# Patient Record
Sex: Male | Born: 1955
Health system: Southern US, Community
[De-identification: ages and names within clinical notes are randomized; demographics above are authoritative.]

## PROBLEM LIST (undated history)

## (undated) DIAGNOSIS — I10 Essential (primary) hypertension: Secondary | ICD-10-CM

## (undated) DIAGNOSIS — J302 Other seasonal allergic rhinitis: Secondary | ICD-10-CM

## (undated) DIAGNOSIS — E785 Hyperlipidemia, unspecified: Secondary | ICD-10-CM

## (undated) DIAGNOSIS — N200 Calculus of kidney: Secondary | ICD-10-CM

## (undated) DIAGNOSIS — Z87442 Personal history of urinary calculi: Secondary | ICD-10-CM

## (undated) DIAGNOSIS — C189 Malignant neoplasm of colon, unspecified: Secondary | ICD-10-CM

## (undated) DIAGNOSIS — J189 Pneumonia, unspecified organism: Secondary | ICD-10-CM

## (undated) DIAGNOSIS — D649 Anemia, unspecified: Secondary | ICD-10-CM

## (undated) DIAGNOSIS — Z933 Colostomy status: Secondary | ICD-10-CM

## (undated) DIAGNOSIS — T7840XA Allergy, unspecified, initial encounter: Secondary | ICD-10-CM

## (undated) DIAGNOSIS — H409 Unspecified glaucoma: Secondary | ICD-10-CM

## (undated) DIAGNOSIS — G709 Myoneural disorder, unspecified: Secondary | ICD-10-CM

## (undated) DIAGNOSIS — C801 Malignant (primary) neoplasm, unspecified: Secondary | ICD-10-CM

## (undated) DIAGNOSIS — M1990 Unspecified osteoarthritis, unspecified site: Secondary | ICD-10-CM

## (undated) DIAGNOSIS — R0600 Dyspnea, unspecified: Secondary | ICD-10-CM

## (undated) HISTORY — PX: CHOLECYSTECTOMY: SHX55

## (undated) HISTORY — PX: LUMBAR DISC SURGERY: SHX700

## (undated) HISTORY — DX: Unspecified glaucoma: H40.9

## (undated) HISTORY — PX: PORTACATH PLACEMENT: SHX2246

## (undated) HISTORY — DX: Malignant neoplasm of colon, unspecified: C18.9

## (undated) HISTORY — DX: Anemia, unspecified: D64.9

## (undated) HISTORY — PX: KNEE ARTHROSCOPY: SUR90

## (undated) HISTORY — PX: BACK SURGERY: SHX140

## (undated) HISTORY — DX: Allergy, unspecified, initial encounter: T78.40XA

## (undated) HISTORY — PX: OTHER SURGICAL HISTORY: SHX169

## (undated) HISTORY — PX: SPINE SURGERY: SHX786

## (undated) HISTORY — PX: KNEE SURGERY: SHX244

## (undated) HISTORY — DX: Hyperlipidemia, unspecified: E78.5

## (undated) HISTORY — PX: COLON SURGERY: SHX602

## (undated) HISTORY — DX: Calculus of kidney: N20.0

## (undated) SURGERY — Surgical Case
Anesthesia: *Unknown

---

## 2005-03-11 ENCOUNTER — Emergency Department: Payer: Self-pay | Admitting: Emergency Medicine

## 2009-02-10 ENCOUNTER — Ambulatory Visit: Payer: Self-pay | Admitting: Gastroenterology

## 2010-07-22 ENCOUNTER — Inpatient Hospital Stay: Payer: Self-pay | Admitting: Vascular Surgery

## 2011-08-13 ENCOUNTER — Emergency Department: Payer: Self-pay | Admitting: Emergency Medicine

## 2012-01-04 ENCOUNTER — Ambulatory Visit: Payer: Self-pay | Admitting: Ophthalmology

## 2013-01-23 DIAGNOSIS — C20 Malignant neoplasm of rectum: Secondary | ICD-10-CM

## 2013-01-23 DIAGNOSIS — C801 Malignant (primary) neoplasm, unspecified: Secondary | ICD-10-CM

## 2013-01-23 HISTORY — DX: Malignant neoplasm of rectum: C20

## 2013-01-23 HISTORY — DX: Malignant (primary) neoplasm, unspecified: C80.1

## 2013-04-29 ENCOUNTER — Ambulatory Visit: Payer: Self-pay | Admitting: Surgery

## 2013-04-30 HISTORY — PX: RECTAL BIOPSY: SHX2303

## 2013-05-01 LAB — PATHOLOGY REPORT

## 2013-05-08 ENCOUNTER — Ambulatory Visit: Payer: Self-pay | Admitting: Oncology

## 2013-05-08 LAB — COMPREHENSIVE METABOLIC PANEL
ALBUMIN: 3.7 g/dL (ref 3.4–5.0)
AST: 10 U/L — AB (ref 15–37)
Alkaline Phosphatase: 72 U/L
Anion Gap: 9 (ref 7–16)
BUN: 16 mg/dL (ref 7–18)
Bilirubin,Total: 0.2 mg/dL (ref 0.2–1.0)
Calcium, Total: 9.5 mg/dL (ref 8.5–10.1)
Chloride: 105 mmol/L (ref 98–107)
Co2: 29 mmol/L (ref 21–32)
Creatinine: 1.11 mg/dL (ref 0.60–1.30)
EGFR (Non-African Amer.): >60
Glucose: 101 mg/dL — ABNORMAL HIGH (ref 65–99)
Osmolality: 286 (ref 275–301)
Potassium: 4.2 mmol/L (ref 3.5–5.1)
SGPT (ALT): 15 U/L (ref 12–78)
Sodium: 143 mmol/L (ref 136–145)
TOTAL PROTEIN: 7.3 g/dL (ref 6.4–8.2)

## 2013-05-08 LAB — CBC CANCER CENTER
BASOS ABS: 0.3 x10 3/mm — AB (ref 0.0–0.1)
Basophil %: 4.1 %
Eosinophil #: 0.2 x10 3/mm (ref 0.0–0.7)
Eosinophil %: 2.6 %
HCT: 41.4 % (ref 40.0–52.0)
HGB: 13.2 g/dL (ref 13.0–18.0)
LYMPHS ABS: 1.9 x10 3/mm (ref 1.0–3.6)
Lymphocyte %: 23.6 %
MCH: 27.2 pg (ref 26.0–34.0)
MCHC: 31.9 g/dL — ABNORMAL LOW (ref 32.0–36.0)
MCV: 85 fL (ref 80–100)
Monocyte #: 0.6 x10 3/mm (ref 0.2–1.0)
Monocyte %: 7.1 %
NEUTROS PCT: 62.6 %
Neutrophil #: 5.1 x10 3/mm (ref 1.4–6.5)
PLATELETS: 531 x10 3/mm — AB (ref 150–440)
RBC: 4.85 10*6/uL (ref 4.40–5.90)
RDW: 14.1 % (ref 11.5–14.5)
WBC: 8.2 x10 3/mm (ref 3.8–10.6)

## 2013-05-08 LAB — PROTIME-INR
INR: 1
PROTHROMBIN TIME: 13 s (ref 11.5–14.7)

## 2013-05-08 LAB — APTT: Activated PTT: 29.6 secs (ref 23.6–35.9)

## 2013-05-09 LAB — CEA: CEA: 2.4 ng/mL (ref 0.0–4.7)

## 2013-05-13 ENCOUNTER — Ambulatory Visit: Payer: Self-pay | Admitting: Oncology

## 2013-05-22 ENCOUNTER — Ambulatory Visit: Payer: Self-pay | Admitting: Gastroenterology

## 2013-05-23 ENCOUNTER — Ambulatory Visit: Payer: Self-pay | Admitting: Oncology

## 2013-06-17 ENCOUNTER — Ambulatory Visit: Payer: Self-pay | Admitting: Surgery

## 2013-06-23 ENCOUNTER — Ambulatory Visit: Payer: Self-pay | Admitting: Radiation Oncology

## 2013-06-30 LAB — COMPREHENSIVE METABOLIC PANEL
ANION GAP: 10 (ref 7–16)
Albumin: 3.7 g/dL (ref 3.4–5.0)
Alkaline Phosphatase: 67 U/L
BUN: 17 mg/dL (ref 7–18)
Bilirubin,Total: 0.6 mg/dL (ref 0.2–1.0)
CO2: 26 mmol/L (ref 21–32)
CREATININE: 1.12 mg/dL (ref 0.60–1.30)
Calcium, Total: 9 mg/dL (ref 8.5–10.1)
Chloride: 103 mmol/L (ref 98–107)
EGFR (African American): >60
Glucose: 119 mg/dL — ABNORMAL HIGH (ref 65–99)
Osmolality: 280 (ref 275–301)
POTASSIUM: 4.2 mmol/L (ref 3.5–5.1)
SGOT(AST): 16 U/L (ref 15–37)
SGPT (ALT): 15 U/L (ref 12–78)
Sodium: 139 mmol/L (ref 136–145)
Total Protein: 7.2 g/dL (ref 6.4–8.2)

## 2013-06-30 LAB — CBC CANCER CENTER
Basophil #: 0.1 x10 3/mm (ref 0.0–0.1)
Basophil %: 0.9 %
EOS PCT: 1.4 %
Eosinophil #: 0.1 x10 3/mm (ref 0.0–0.7)
HCT: 39.7 % — ABNORMAL LOW (ref 40.0–52.0)
HGB: 13 g/dL (ref 13.0–18.0)
LYMPHS ABS: 1.2 x10 3/mm (ref 1.0–3.6)
Lymphocyte %: 15.6 %
MCH: 27.8 pg (ref 26.0–34.0)
MCHC: 32.8 g/dL (ref 32.0–36.0)
MCV: 85 fL (ref 80–100)
MONO ABS: 0.5 x10 3/mm (ref 0.2–1.0)
MONOS PCT: 6.1 %
Neutrophil #: 6 x10 3/mm (ref 1.4–6.5)
Neutrophil %: 76 %
PLATELETS: 409 x10 3/mm (ref 150–440)
RBC: 4.68 10*6/uL (ref 4.40–5.90)
RDW: 14.5 % (ref 11.5–14.5)
WBC: 7.9 x10 3/mm (ref 3.8–10.6)

## 2013-07-07 LAB — CBC CANCER CENTER
BASOS ABS: 0 x10 3/mm (ref 0.0–0.1)
BASOS PCT: 0.8 %
Eosinophil #: 0.2 x10 3/mm (ref 0.0–0.7)
Eosinophil %: 4.6 %
HCT: 38.5 % — ABNORMAL LOW (ref 40.0–52.0)
HGB: 12.6 g/dL — ABNORMAL LOW (ref 13.0–18.0)
LYMPHS ABS: 1.1 x10 3/mm (ref 1.0–3.6)
Lymphocyte %: 29 %
MCH: 27.9 pg (ref 26.0–34.0)
MCHC: 32.8 g/dL (ref 32.0–36.0)
MCV: 85 fL (ref 80–100)
Monocyte #: 0.2 x10 3/mm (ref 0.2–1.0)
Monocyte %: 4.8 %
NEUTROS PCT: 60.8 %
Neutrophil #: 2.2 x10 3/mm (ref 1.4–6.5)
Platelet: 396 x10 3/mm (ref 150–440)
RBC: 4.52 10*6/uL (ref 4.40–5.90)
RDW: 14.9 % — ABNORMAL HIGH (ref 11.5–14.5)
WBC: 3.7 x10 3/mm — ABNORMAL LOW (ref 3.8–10.6)

## 2013-07-14 LAB — COMPREHENSIVE METABOLIC PANEL
ALT: 15 U/L (ref 12–78)
ANION GAP: 6 — AB (ref 7–16)
Albumin: 3.6 g/dL (ref 3.4–5.0)
Alkaline Phosphatase: 66 U/L
BUN: 18 mg/dL (ref 7–18)
Bilirubin,Total: 0.3 mg/dL (ref 0.2–1.0)
CO2: 30 mmol/L (ref 21–32)
Calcium, Total: 9.3 mg/dL (ref 8.5–10.1)
Chloride: 103 mmol/L (ref 98–107)
Creatinine: 1 mg/dL (ref 0.60–1.30)
EGFR (African American): >60
Glucose: 102 mg/dL — ABNORMAL HIGH (ref 65–99)
Osmolality: 280 (ref 275–301)
Potassium: 4.1 mmol/L (ref 3.5–5.1)
SGOT(AST): 9 U/L — ABNORMAL LOW (ref 15–37)
Sodium: 139 mmol/L (ref 136–145)
Total Protein: 7 g/dL (ref 6.4–8.2)

## 2013-07-14 LAB — CBC CANCER CENTER
BASOS ABS: 0 x10 3/mm (ref 0.0–0.1)
BASOS PCT: 0.8 %
Eosinophil #: 0.2 x10 3/mm (ref 0.0–0.7)
Eosinophil %: 6.3 %
HCT: 38.7 % — AB (ref 40.0–52.0)
HGB: 12.7 g/dL — ABNORMAL LOW (ref 13.0–18.0)
Lymphocyte #: 0.5 x10 3/mm — ABNORMAL LOW (ref 1.0–3.6)
Lymphocyte %: 12.6 %
MCH: 28 pg (ref 26.0–34.0)
MCHC: 32.8 g/dL (ref 32.0–36.0)
MCV: 85 fL (ref 80–100)
MONO ABS: 0.3 x10 3/mm (ref 0.2–1.0)
Monocyte %: 8.7 %
NEUTROS PCT: 71.6 %
Neutrophil #: 2.7 x10 3/mm (ref 1.4–6.5)
Platelet: 275 x10 3/mm (ref 150–440)
RBC: 4.54 10*6/uL (ref 4.40–5.90)
RDW: 14.9 % — ABNORMAL HIGH (ref 11.5–14.5)
WBC: 3.8 x10 3/mm (ref 3.8–10.6)

## 2013-07-21 LAB — CBC CANCER CENTER
BASOS ABS: 0 x10 3/mm (ref 0.0–0.1)
Basophil %: 0.6 %
EOS PCT: 8.4 %
Eosinophil #: 0.2 x10 3/mm (ref 0.0–0.7)
HCT: 36.9 % — AB (ref 40.0–52.0)
HGB: 12.3 g/dL — ABNORMAL LOW (ref 13.0–18.0)
Lymphocyte #: 0.4 x10 3/mm — ABNORMAL LOW (ref 1.0–3.6)
Lymphocyte %: 13.9 %
MCH: 28.3 pg (ref 26.0–34.0)
MCHC: 33.5 g/dL (ref 32.0–36.0)
MCV: 85 fL (ref 80–100)
Monocyte #: 0.3 x10 3/mm (ref 0.2–1.0)
Monocyte %: 8.9 %
Neutrophil #: 2 x10 3/mm (ref 1.4–6.5)
Neutrophil %: 68.2 %
Platelet: 291 x10 3/mm (ref 150–440)
RBC: 4.36 10*6/uL — AB (ref 4.40–5.90)
RDW: 15.2 % — ABNORMAL HIGH (ref 11.5–14.5)
WBC: 3 x10 3/mm — ABNORMAL LOW (ref 3.8–10.6)

## 2013-07-23 ENCOUNTER — Ambulatory Visit: Payer: Self-pay | Admitting: Radiation Oncology

## 2013-07-28 LAB — CBC CANCER CENTER
BASOS ABS: 0 x10 3/mm (ref 0.0–0.1)
Basophil %: 0.4 %
EOS ABS: 0.2 x10 3/mm (ref 0.0–0.7)
Eosinophil %: 4.7 %
HCT: 39.7 % — ABNORMAL LOW (ref 40.0–52.0)
HGB: 13.1 g/dL (ref 13.0–18.0)
Lymphocyte #: 0.3 x10 3/mm — ABNORMAL LOW (ref 1.0–3.6)
Lymphocyte %: 8 %
MCH: 28.1 pg (ref 26.0–34.0)
MCHC: 33 g/dL (ref 32.0–36.0)
MCV: 85 fL (ref 80–100)
Monocyte #: 0.4 x10 3/mm (ref 0.2–1.0)
Monocyte %: 9.6 %
NEUTROS ABS: 3.2 x10 3/mm (ref 1.4–6.5)
Neutrophil %: 77.3 %
Platelet: 337 x10 3/mm (ref 150–440)
RBC: 4.65 10*6/uL (ref 4.40–5.90)
RDW: 16 % — ABNORMAL HIGH (ref 11.5–14.5)
WBC: 4.2 x10 3/mm (ref 3.8–10.6)

## 2013-08-04 LAB — CBC CANCER CENTER
BASOS ABS: 0 x10 3/mm (ref 0.0–0.1)
BASOS PCT: 0.5 %
EOS PCT: 6 %
Eosinophil #: 0.2 x10 3/mm (ref 0.0–0.7)
HCT: 36.8 % — AB (ref 40.0–52.0)
HGB: 12.4 g/dL — ABNORMAL LOW (ref 13.0–18.0)
LYMPHS ABS: 0.2 x10 3/mm — AB (ref 1.0–3.6)
LYMPHS PCT: 7.1 %
MCH: 28.6 pg (ref 26.0–34.0)
MCHC: 33.8 g/dL (ref 32.0–36.0)
MCV: 85 fL (ref 80–100)
MONO ABS: 0.4 x10 3/mm (ref 0.2–1.0)
MONOS PCT: 11.1 %
Neutrophil #: 2.4 x10 3/mm (ref 1.4–6.5)
Neutrophil %: 75.3 %
Platelet: 427 x10 3/mm (ref 150–440)
RBC: 4.35 10*6/uL — AB (ref 4.40–5.90)
RDW: 16.7 % — ABNORMAL HIGH (ref 11.5–14.5)
WBC: 3.2 x10 3/mm — ABNORMAL LOW (ref 3.8–10.6)

## 2013-08-11 LAB — CBC CANCER CENTER
Basophil #: 0 x10 3/mm (ref 0.0–0.1)
Basophil %: 0.3 %
Eosinophil #: 0.5 x10 3/mm (ref 0.0–0.7)
Eosinophil %: 13.1 %
HCT: 38.6 % — ABNORMAL LOW (ref 40.0–52.0)
HGB: 12.8 g/dL — ABNORMAL LOW (ref 13.0–18.0)
LYMPHS ABS: 0.2 x10 3/mm — AB (ref 1.0–3.6)
Lymphocyte %: 5.4 %
MCH: 28.3 pg (ref 26.0–34.0)
MCHC: 33.1 g/dL (ref 32.0–36.0)
MCV: 85 fL (ref 80–100)
Monocyte #: 0.2 x10 3/mm (ref 0.2–1.0)
Monocyte %: 5.6 %
Neutrophil #: 2.9 x10 3/mm (ref 1.4–6.5)
Neutrophil %: 75.6 %
Platelet: 456 x10 3/mm — ABNORMAL HIGH (ref 150–440)
RBC: 4.52 10*6/uL (ref 4.40–5.90)
RDW: 16.4 % — ABNORMAL HIGH (ref 11.5–14.5)
WBC: 3.9 x10 3/mm (ref 3.8–10.6)

## 2013-08-11 LAB — COMPREHENSIVE METABOLIC PANEL
Albumin: 3.5 g/dL (ref 3.4–5.0)
Alkaline Phosphatase: 56 U/L
Anion Gap: 9 (ref 7–16)
BUN: 11 mg/dL (ref 7–18)
Bilirubin,Total: 0.3 mg/dL (ref 0.2–1.0)
CREATININE: 1.17 mg/dL (ref 0.60–1.30)
Calcium, Total: 9.2 mg/dL (ref 8.5–10.1)
Chloride: 105 mmol/L (ref 98–107)
Co2: 27 mmol/L (ref 21–32)
EGFR (African American): >60
EGFR (Non-African Amer.): >60
Glucose: 98 mg/dL (ref 65–99)
Osmolality: 281 (ref 275–301)
POTASSIUM: 4 mmol/L (ref 3.5–5.1)
SGOT(AST): 14 U/L — ABNORMAL LOW (ref 15–37)
SGPT (ALT): 27 U/L (ref 12–78)
SODIUM: 141 mmol/L (ref 136–145)
Total Protein: 6.9 g/dL (ref 6.4–8.2)

## 2013-08-18 ENCOUNTER — Ambulatory Visit: Payer: Self-pay | Admitting: Family Medicine

## 2013-08-23 ENCOUNTER — Ambulatory Visit: Payer: Self-pay | Admitting: Radiation Oncology

## 2013-09-30 ENCOUNTER — Ambulatory Visit: Payer: Self-pay | Admitting: Oncology

## 2013-09-30 ENCOUNTER — Ambulatory Visit: Payer: Self-pay | Admitting: Surgery

## 2013-09-30 LAB — CBC WITH DIFFERENTIAL/PLATELET
Basophil #: 0.1 10*3/uL (ref 0.0–0.1)
Basophil %: 1.6 %
Eosinophil #: 0.2 10*3/uL (ref 0.0–0.7)
Eosinophil %: 4.5 %
HCT: 39.1 % — ABNORMAL LOW (ref 40.0–52.0)
HGB: 12.5 g/dL — ABNORMAL LOW (ref 13.0–18.0)
Lymphocyte #: 0.4 10*3/uL — ABNORMAL LOW (ref 1.0–3.6)
Lymphocyte %: 10.1 %
MCH: 28.5 pg (ref 26.0–34.0)
MCHC: 31.9 g/dL — ABNORMAL LOW (ref 32.0–36.0)
MCV: 89 fL (ref 80–100)
Monocyte #: 0.4 x10 3/mm (ref 0.2–1.0)
Monocyte %: 10.1 %
NEUTROS ABS: 2.6 10*3/uL (ref 1.4–6.5)
Neutrophil %: 73.7 %
Platelet: 342 10*3/uL (ref 150–440)
RBC: 4.38 10*6/uL — AB (ref 4.40–5.90)
RDW: 17 % — ABNORMAL HIGH (ref 11.5–14.5)
WBC: 3.5 10*3/uL — ABNORMAL LOW (ref 3.8–10.6)

## 2013-09-30 LAB — BASIC METABOLIC PANEL
Anion Gap: 4 — ABNORMAL LOW (ref 7–16)
BUN: 11 mg/dL (ref 7–18)
CALCIUM: 8.5 mg/dL (ref 8.5–10.1)
CO2: 27 mmol/L (ref 21–32)
Chloride: 109 mmol/L — ABNORMAL HIGH (ref 98–107)
Creatinine: 1.02 mg/dL (ref 0.60–1.30)
Glucose: 93 mg/dL (ref 65–99)
Osmolality: 278 (ref 275–301)
Potassium: 4.1 mmol/L (ref 3.5–5.1)
Sodium: 140 mmol/L (ref 136–145)

## 2013-09-30 LAB — HEPATIC FUNCTION PANEL A (ARMC)
ALBUMIN: 3.3 g/dL — AB (ref 3.4–5.0)
ALK PHOS: 71 U/L
AST: 21 U/L (ref 15–37)
Bilirubin, Direct: <0.1 mg/dL (ref 0.00–0.20)
Bilirubin,Total: 0.3 mg/dL (ref 0.2–1.0)
SGPT (ALT): 16 U/L
Total Protein: 6.8 g/dL (ref 6.4–8.2)

## 2013-10-01 LAB — CEA: CEA: 1.1 ng/mL (ref 0.0–4.7)

## 2013-10-07 ENCOUNTER — Inpatient Hospital Stay: Payer: Self-pay | Admitting: Surgery

## 2013-10-08 LAB — COMPREHENSIVE METABOLIC PANEL
ANION GAP: 7 (ref 7–16)
AST: 20 U/L (ref 15–37)
Albumin: 2.5 g/dL — ABNORMAL LOW (ref 3.4–5.0)
Alkaline Phosphatase: 46 U/L
BUN: 15 mg/dL (ref 7–18)
Bilirubin,Total: 0.8 mg/dL (ref 0.2–1.0)
CALCIUM: 7.9 mg/dL — AB (ref 8.5–10.1)
CHLORIDE: 107 mmol/L (ref 98–107)
Co2: 26 mmol/L (ref 21–32)
Creatinine: 1.27 mg/dL (ref 0.60–1.30)
EGFR (African American): >60
EGFR (Non-African Amer.): >60
Glucose: 122 mg/dL — ABNORMAL HIGH (ref 65–99)
Osmolality: 282 (ref 275–301)
Potassium: 4.1 mmol/L (ref 3.5–5.1)
SGPT (ALT): 16 U/L
Sodium: 140 mmol/L (ref 136–145)
Total Protein: 5.6 g/dL — ABNORMAL LOW (ref 6.4–8.2)

## 2013-10-08 LAB — CBC WITH DIFFERENTIAL/PLATELET
BASOS ABS: 0 10*3/uL (ref 0.0–0.1)
Basophil %: 0.2 %
Eosinophil #: 0 10*3/uL (ref 0.0–0.7)
Eosinophil %: 0 %
HCT: 33.2 % — ABNORMAL LOW (ref 40.0–52.0)
HGB: 11.2 g/dL — AB (ref 13.0–18.0)
Lymphocyte #: 0.3 10*3/uL — ABNORMAL LOW (ref 1.0–3.6)
Lymphocyte %: 4.9 %
MCH: 29.7 pg (ref 26.0–34.0)
MCHC: 33.7 g/dL (ref 32.0–36.0)
MCV: 88 fL (ref 80–100)
Monocyte #: 0.9 x10 3/mm (ref 0.2–1.0)
Monocyte %: 13.7 %
NEUTROS ABS: 5.2 10*3/uL (ref 1.4–6.5)
NEUTROS PCT: 81.2 %
Platelet: 377 10*3/uL (ref 150–440)
RBC: 3.77 10*6/uL — ABNORMAL LOW (ref 4.40–5.90)
RDW: 16.4 % — ABNORMAL HIGH (ref 11.5–14.5)
WBC: 6.5 10*3/uL (ref 3.8–10.6)

## 2013-10-09 HISTORY — PX: XI ROBOT ABDOMINAL PERINEAL RESECTION: SHX6709

## 2013-10-10 LAB — CBC WITH DIFFERENTIAL/PLATELET
Basophil #: 0 10*3/uL (ref 0.0–0.1)
Basophil %: 0.6 %
EOS PCT: 2.1 %
Eosinophil #: 0.1 10*3/uL (ref 0.0–0.7)
HCT: 27 % — AB (ref 40.0–52.0)
HGB: 9.2 g/dL — ABNORMAL LOW (ref 13.0–18.0)
LYMPHS PCT: 4.7 %
Lymphocyte #: 0.3 10*3/uL — ABNORMAL LOW (ref 1.0–3.6)
MCH: 30.2 pg (ref 26.0–34.0)
MCHC: 33.9 g/dL (ref 32.0–36.0)
MCV: 89 fL (ref 80–100)
MONOS PCT: 11.8 %
Monocyte #: 0.7 x10 3/mm (ref 0.2–1.0)
NEUTROS ABS: 4.8 10*3/uL (ref 1.4–6.5)
NEUTROS PCT: 80.8 %
Platelet: 361 10*3/uL (ref 150–440)
RBC: 3.04 10*6/uL — ABNORMAL LOW (ref 4.40–5.90)
RDW: 16 % — ABNORMAL HIGH (ref 11.5–14.5)
WBC: 5.9 10*3/uL (ref 3.8–10.6)

## 2013-10-10 LAB — COMPREHENSIVE METABOLIC PANEL
ALBUMIN: 2 g/dL — AB (ref 3.4–5.0)
ANION GAP: 5 — AB (ref 7–16)
Alkaline Phosphatase: 36 U/L — ABNORMAL LOW
BUN: 14 mg/dL (ref 7–18)
Bilirubin,Total: 0.3 mg/dL (ref 0.2–1.0)
CHLORIDE: 101 mmol/L (ref 98–107)
CREATININE: 0.84 mg/dL (ref 0.60–1.30)
Calcium, Total: 8.1 mg/dL — ABNORMAL LOW (ref 8.5–10.1)
Co2: 30 mmol/L (ref 21–32)
EGFR (African American): >60
Glucose: 120 mg/dL — ABNORMAL HIGH (ref 65–99)
Osmolality: 274 (ref 275–301)
Potassium: 4.2 mmol/L (ref 3.5–5.1)
SGOT(AST): 14 U/L — ABNORMAL LOW (ref 15–37)
SGPT (ALT): 11 U/L — ABNORMAL LOW
SODIUM: 136 mmol/L (ref 136–145)
Total Protein: 5.5 g/dL — ABNORMAL LOW (ref 6.4–8.2)

## 2013-10-12 LAB — CBC WITH DIFFERENTIAL/PLATELET
Basophil #: 0 10*3/uL (ref 0.0–0.1)
Basophil %: 0.3 %
EOS PCT: 0.5 %
Eosinophil #: 0 10*3/uL (ref 0.0–0.7)
HCT: 30.9 % — ABNORMAL LOW (ref 40.0–52.0)
HGB: 10.3 g/dL — AB (ref 13.0–18.0)
LYMPHS ABS: 0.2 10*3/uL — AB (ref 1.0–3.6)
LYMPHS PCT: 2.8 %
MCH: 29.4 pg (ref 26.0–34.0)
MCHC: 33.3 g/dL (ref 32.0–36.0)
MCV: 88 fL (ref 80–100)
Monocyte #: 0.6 x10 3/mm (ref 0.2–1.0)
Monocyte %: 8.4 %
NEUTROS PCT: 88 %
Neutrophil #: 6.4 10*3/uL (ref 1.4–6.5)
Platelet: 520 10*3/uL — ABNORMAL HIGH (ref 150–440)
RBC: 3.51 10*6/uL — AB (ref 4.40–5.90)
RDW: 15.6 % — ABNORMAL HIGH (ref 11.5–14.5)
WBC: 7.2 10*3/uL (ref 3.8–10.6)

## 2013-10-12 LAB — BASIC METABOLIC PANEL
Anion Gap: 7 (ref 7–16)
BUN: 19 mg/dL — ABNORMAL HIGH (ref 7–18)
CALCIUM: 8.9 mg/dL (ref 8.5–10.1)
CO2: 28 mmol/L (ref 21–32)
Chloride: 97 mmol/L — ABNORMAL LOW (ref 98–107)
Creatinine: 0.91 mg/dL (ref 0.60–1.30)
EGFR (African American): >60
GLUCOSE: 135 mg/dL — AB (ref 65–99)
Osmolality: 269 (ref 275–301)
POTASSIUM: 4.2 mmol/L (ref 3.5–5.1)
SODIUM: 132 mmol/L — AB (ref 136–145)

## 2013-10-13 LAB — BASIC METABOLIC PANEL
ANION GAP: 7 (ref 7–16)
BUN: 24 mg/dL — ABNORMAL HIGH (ref 7–18)
CALCIUM: 8.3 mg/dL — AB (ref 8.5–10.1)
CHLORIDE: 100 mmol/L (ref 98–107)
CO2: 28 mmol/L (ref 21–32)
CREATININE: 0.89 mg/dL (ref 0.60–1.30)
EGFR (African American): >60
EGFR (Non-African Amer.): >60
GLUCOSE: 108 mg/dL — AB (ref 65–99)
OSMOLALITY: 275 (ref 275–301)
POTASSIUM: 4.5 mmol/L (ref 3.5–5.1)
SODIUM: 135 mmol/L — AB (ref 136–145)

## 2013-10-13 LAB — CBC WITH DIFFERENTIAL/PLATELET
BASOS ABS: 0 10*3/uL (ref 0.0–0.1)
Basophil %: 0.4 %
EOS ABS: 0.1 10*3/uL (ref 0.0–0.7)
Eosinophil %: 2 %
HCT: 30 % — AB (ref 40.0–52.0)
HGB: 10.2 g/dL — AB (ref 13.0–18.0)
LYMPHS PCT: 3.6 %
Lymphocyte #: 0.2 10*3/uL — ABNORMAL LOW (ref 1.0–3.6)
MCH: 29.8 pg (ref 26.0–34.0)
MCHC: 33.9 g/dL (ref 32.0–36.0)
MCV: 88 fL (ref 80–100)
Monocyte #: 0.6 x10 3/mm (ref 0.2–1.0)
Monocyte %: 9.9 %
Neutrophil #: 5.2 10*3/uL (ref 1.4–6.5)
Neutrophil %: 84.1 %
Platelet: 533 10*3/uL — ABNORMAL HIGH (ref 150–440)
RBC: 3.41 10*6/uL — ABNORMAL LOW (ref 4.40–5.90)
RDW: 16 % — ABNORMAL HIGH (ref 11.5–14.5)
WBC: 6.2 10*3/uL (ref 3.8–10.6)

## 2013-10-15 LAB — CLOSTRIDIUM DIFFICILE(ARMC)

## 2013-10-15 LAB — PATHOLOGY REPORT

## 2013-10-16 LAB — BASIC METABOLIC PANEL
ANION GAP: 8 (ref 7–16)
BUN: 17 mg/dL (ref 7–18)
CALCIUM: 8 mg/dL — AB (ref 8.5–10.1)
CHLORIDE: 103 mmol/L (ref 98–107)
Co2: 25 mmol/L (ref 21–32)
Creatinine: 0.87 mg/dL (ref 0.60–1.30)
EGFR (African American): >60
Glucose: 96 mg/dL (ref 65–99)
OSMOLALITY: 273 (ref 275–301)
POTASSIUM: 3.5 mmol/L (ref 3.5–5.1)
Sodium: 136 mmol/L (ref 136–145)

## 2013-10-16 LAB — CBC WITH DIFFERENTIAL/PLATELET
Basophil #: 0 10*3/uL (ref 0.0–0.1)
Basophil %: 0.7 %
EOS ABS: 0.3 10*3/uL (ref 0.0–0.7)
Eosinophil %: 4.1 %
HCT: 30.2 % — ABNORMAL LOW (ref 40.0–52.0)
HGB: 9.8 g/dL — ABNORMAL LOW (ref 13.0–18.0)
LYMPHS ABS: 0.4 10*3/uL — AB (ref 1.0–3.6)
LYMPHS PCT: 5.3 %
MCH: 28.5 pg (ref 26.0–34.0)
MCHC: 32.6 g/dL (ref 32.0–36.0)
MCV: 88 fL (ref 80–100)
Monocyte #: 0.7 x10 3/mm (ref 0.2–1.0)
Monocyte %: 10.2 %
Neutrophil #: 5.5 10*3/uL (ref 1.4–6.5)
Neutrophil %: 79.7 %
Platelet: 710 10*3/uL — ABNORMAL HIGH (ref 150–440)
RBC: 3.44 10*6/uL — AB (ref 4.40–5.90)
RDW: 15.8 % — AB (ref 11.5–14.5)
WBC: 6.9 10*3/uL (ref 3.8–10.6)

## 2013-10-22 ENCOUNTER — Other Ambulatory Visit: Payer: Self-pay | Admitting: Surgery

## 2013-10-22 LAB — CBC WITH DIFFERENTIAL/PLATELET
Basophil #: 0.1 10*3/uL (ref 0.0–0.1)
Basophil %: 0.7 %
Eosinophil #: 0.1 10*3/uL (ref 0.0–0.7)
Eosinophil %: 1.4 %
HCT: 33.3 % — ABNORMAL LOW (ref 40.0–52.0)
HGB: 10.8 g/dL — AB (ref 13.0–18.0)
Lymphocyte #: 0.4 10*3/uL — ABNORMAL LOW (ref 1.0–3.6)
Lymphocyte %: 4.7 %
MCH: 28.5 pg (ref 26.0–34.0)
MCHC: 32.6 g/dL (ref 32.0–36.0)
MCV: 88 fL (ref 80–100)
Monocyte #: 0.7 x10 3/mm (ref 0.2–1.0)
Monocyte %: 8.6 %
Neutrophil #: 7.2 10*3/uL — ABNORMAL HIGH (ref 1.4–6.5)
Neutrophil %: 84.6 %
PLATELETS: 753 10*3/uL — AB (ref 150–440)
RBC: 3.8 10*6/uL — ABNORMAL LOW (ref 4.40–5.90)
RDW: 16.1 % — ABNORMAL HIGH (ref 11.5–14.5)
WBC: 8.5 10*3/uL (ref 3.8–10.6)

## 2013-10-22 LAB — COMPREHENSIVE METABOLIC PANEL
ANION GAP: 7 (ref 7–16)
AST: 21 U/L (ref 15–37)
Albumin: 2.8 g/dL — ABNORMAL LOW (ref 3.4–5.0)
Alkaline Phosphatase: 65 U/L
BUN: 9 mg/dL (ref 7–18)
Bilirubin,Total: 0.4 mg/dL (ref 0.2–1.0)
CALCIUM: 8.6 mg/dL (ref 8.5–10.1)
CO2: 26 mmol/L (ref 21–32)
Chloride: 104 mmol/L (ref 98–107)
Creatinine: 0.91 mg/dL (ref 0.60–1.30)
EGFR (African American): >60
Glucose: 103 mg/dL — ABNORMAL HIGH (ref 65–99)
OSMOLALITY: 273 (ref 275–301)
Potassium: 3.8 mmol/L (ref 3.5–5.1)
SGPT (ALT): 36 U/L
Sodium: 137 mmol/L (ref 136–145)
Total Protein: 6.7 g/dL (ref 6.4–8.2)

## 2013-10-30 ENCOUNTER — Ambulatory Visit: Payer: Self-pay | Admitting: Surgery

## 2013-10-30 LAB — CBC WITH DIFFERENTIAL/PLATELET
Basophil #: 0 10*3/uL (ref 0.0–0.1)
Basophil %: 0.6 %
Eosinophil #: 0.1 10*3/uL (ref 0.0–0.7)
Eosinophil %: 2 %
HCT: 31.8 % — ABNORMAL LOW (ref 40.0–52.0)
HGB: 10.2 g/dL — AB (ref 13.0–18.0)
Lymphocyte #: 0.4 10*3/uL — ABNORMAL LOW (ref 1.0–3.6)
Lymphocyte %: 5.9 %
MCH: 27.4 pg (ref 26.0–34.0)
MCHC: 31.9 g/dL — AB (ref 32.0–36.0)
MCV: 86 fL (ref 80–100)
MONO ABS: 0.4 x10 3/mm (ref 0.2–1.0)
Monocyte %: 6.1 %
Neutrophil #: 5.5 10*3/uL (ref 1.4–6.5)
Neutrophil %: 85.4 %
PLATELETS: 761 10*3/uL — AB (ref 150–440)
RBC: 3.71 10*6/uL — AB (ref 4.40–5.90)
RDW: 15.7 % — AB (ref 11.5–14.5)
WBC: 6.5 10*3/uL (ref 3.8–10.6)

## 2013-10-30 LAB — COMPREHENSIVE METABOLIC PANEL
ALBUMIN: 2.7 g/dL — AB (ref 3.4–5.0)
ALK PHOS: 64 U/L
Anion Gap: 8 (ref 7–16)
BILIRUBIN TOTAL: 0.3 mg/dL (ref 0.2–1.0)
BUN: 9 mg/dL (ref 7–18)
CHLORIDE: 101 mmol/L (ref 98–107)
CREATININE: 0.87 mg/dL (ref 0.60–1.30)
Calcium, Total: 9 mg/dL (ref 8.5–10.1)
Co2: 28 mmol/L (ref 21–32)
EGFR (Non-African Amer.): >60
Glucose: 102 mg/dL — ABNORMAL HIGH (ref 65–99)
Osmolality: 273 (ref 275–301)
Potassium: 4.5 mmol/L (ref 3.5–5.1)
SGOT(AST): 14 U/L — ABNORMAL LOW (ref 15–37)
SGPT (ALT): 20 U/L
Sodium: 137 mmol/L (ref 136–145)
Total Protein: 7.4 g/dL (ref 6.4–8.2)

## 2013-10-30 LAB — URINALYSIS, COMPLETE
BILIRUBIN, UR: NEGATIVE
Bacteria: NONE SEEN
Blood: NEGATIVE
Glucose,UR: NEGATIVE mg/dL (ref 0–75)
Ketone: NEGATIVE
Leukocyte Esterase: NEGATIVE
Nitrite: NEGATIVE
PH: 6 (ref 4.5–8.0)
Protein: NEGATIVE
RBC,UR: 2 /HPF (ref 0–5)
SQUAMOUS EPITHELIAL: NONE SEEN
Specific Gravity: 1.018 (ref 1.003–1.030)
WBC UR: 3 /HPF (ref 0–5)

## 2013-10-30 LAB — CLOSTRIDIUM DIFFICILE(ARMC)

## 2013-11-01 LAB — URINE CULTURE

## 2013-11-13 ENCOUNTER — Ambulatory Visit: Payer: Self-pay | Admitting: Oncology

## 2013-11-23 ENCOUNTER — Ambulatory Visit: Payer: Self-pay | Admitting: Oncology

## 2013-11-27 LAB — COMPREHENSIVE METABOLIC PANEL
ALBUMIN: 3.3 g/dL — AB (ref 3.4–5.0)
ALT: 13 U/L — AB
ANION GAP: 12 (ref 7–16)
AST: 9 U/L — AB (ref 15–37)
Alkaline Phosphatase: 69 U/L
BILIRUBIN TOTAL: 0.2 mg/dL (ref 0.2–1.0)
BUN: 12 mg/dL (ref 7–18)
CHLORIDE: 103 mmol/L (ref 98–107)
CREATININE: 1.02 mg/dL (ref 0.60–1.30)
Calcium, Total: 9.5 mg/dL (ref 8.5–10.1)
Co2: 27 mmol/L (ref 21–32)
EGFR (African American): >60
EGFR (Non-African Amer.): >60
GLUCOSE: 128 mg/dL — AB (ref 65–99)
Osmolality: 285 (ref 275–301)
Potassium: 4.3 mmol/L (ref 3.5–5.1)
Sodium: 142 mmol/L (ref 136–145)
TOTAL PROTEIN: 7.2 g/dL (ref 6.4–8.2)

## 2013-11-27 LAB — CBC CANCER CENTER
Basophil #: 0.1 x10 3/mm (ref 0.0–0.1)
Basophil %: 1 %
Eosinophil #: 0.1 x10 3/mm (ref 0.0–0.7)
Eosinophil %: 2.4 %
HCT: 37.1 % — ABNORMAL LOW (ref 40.0–52.0)
HGB: 11.8 g/dL — ABNORMAL LOW (ref 13.0–18.0)
LYMPHS ABS: 0.5 x10 3/mm — AB (ref 1.0–3.6)
Lymphocyte %: 9 %
MCH: 26.6 pg (ref 26.0–34.0)
MCHC: 31.7 g/dL — ABNORMAL LOW (ref 32.0–36.0)
MCV: 84 fL (ref 80–100)
MONOS PCT: 5.3 %
Monocyte #: 0.3 x10 3/mm (ref 0.2–1.0)
Neutrophil #: 4.4 x10 3/mm (ref 1.4–6.5)
Neutrophil %: 82.3 %
Platelet: 671 x10 3/mm — ABNORMAL HIGH (ref 150–440)
RBC: 4.44 10*6/uL (ref 4.40–5.90)
RDW: 17.3 % — ABNORMAL HIGH (ref 11.5–14.5)
WBC: 5.4 x10 3/mm (ref 3.8–10.6)

## 2013-12-23 ENCOUNTER — Ambulatory Visit: Payer: Self-pay | Admitting: Oncology

## 2013-12-25 LAB — CBC CANCER CENTER
BASOS ABS: 0.1 x10 3/mm (ref 0.0–0.1)
BASOS PCT: 2.2 %
EOS PCT: 4.6 %
Eosinophil #: 0.2 x10 3/mm (ref 0.0–0.7)
HCT: 39.1 % — ABNORMAL LOW (ref 40.0–52.0)
HGB: 12.4 g/dL — ABNORMAL LOW (ref 13.0–18.0)
LYMPHS ABS: 0.4 x10 3/mm — AB (ref 1.0–3.6)
Lymphocyte %: 13.1 %
MCH: 25.4 pg — ABNORMAL LOW (ref 26.0–34.0)
MCHC: 31.7 g/dL — ABNORMAL LOW (ref 32.0–36.0)
MCV: 80 fL (ref 80–100)
Monocyte #: 0.3 x10 3/mm (ref 0.2–1.0)
Monocyte %: 8.8 %
NEUTROS PCT: 71.3 %
Neutrophil #: 2.4 x10 3/mm (ref 1.4–6.5)
PLATELETS: 510 x10 3/mm — AB (ref 150–440)
RBC: 4.87 10*6/uL (ref 4.40–5.90)
RDW: 18.4 % — ABNORMAL HIGH (ref 11.5–14.5)
WBC: 3.4 x10 3/mm — ABNORMAL LOW (ref 3.8–10.6)

## 2013-12-25 LAB — COMPREHENSIVE METABOLIC PANEL
ALK PHOS: 74 U/L
ALT: 17 U/L
ANION GAP: 10 (ref 7–16)
Albumin: 3.5 g/dL (ref 3.4–5.0)
BILIRUBIN TOTAL: 0.3 mg/dL (ref 0.2–1.0)
BUN: 21 mg/dL — ABNORMAL HIGH (ref 7–18)
CHLORIDE: 107 mmol/L (ref 98–107)
CO2: 27 mmol/L (ref 21–32)
Calcium, Total: 9.3 mg/dL (ref 8.5–10.1)
Creatinine: 1.07 mg/dL (ref 0.60–1.30)
Glucose: 116 mg/dL — ABNORMAL HIGH (ref 65–99)
Osmolality: 291 (ref 275–301)
Potassium: 4.2 mmol/L (ref 3.5–5.1)
SGOT(AST): 15 U/L (ref 15–37)
Sodium: 144 mmol/L (ref 136–145)
Total Protein: 7.1 g/dL (ref 6.4–8.2)

## 2014-01-23 ENCOUNTER — Ambulatory Visit: Payer: Self-pay | Admitting: Oncology

## 2014-02-05 LAB — CBC CANCER CENTER
BASOS PCT: 1.4 %
Basophil #: 0 x10 3/mm (ref 0.0–0.1)
Eosinophil #: 0.1 x10 3/mm (ref 0.0–0.7)
Eosinophil %: 4.4 %
HCT: 39.9 % — ABNORMAL LOW (ref 40.0–52.0)
HGB: 12.8 g/dL — AB (ref 13.0–18.0)
Lymphocyte #: 0.5 x10 3/mm — ABNORMAL LOW (ref 1.0–3.6)
Lymphocyte %: 16.7 %
MCH: 26 pg (ref 26.0–34.0)
MCHC: 32 g/dL (ref 32.0–36.0)
MCV: 81 fL (ref 80–100)
MONO ABS: 0.3 x10 3/mm (ref 0.2–1.0)
Monocyte %: 9.5 %
NEUTROS PCT: 68 %
Neutrophil #: 2.1 x10 3/mm (ref 1.4–6.5)
Platelet: 362 x10 3/mm (ref 150–440)
RBC: 4.9 10*6/uL (ref 4.40–5.90)
RDW: 19.7 % — ABNORMAL HIGH (ref 11.5–14.5)
WBC: 3 x10 3/mm — ABNORMAL LOW (ref 3.8–10.6)

## 2014-02-06 LAB — CEA: CEA: 1.6 ng/mL (ref 0.0–4.7)

## 2014-02-23 ENCOUNTER — Ambulatory Visit: Payer: Self-pay | Admitting: Oncology

## 2014-03-26 ENCOUNTER — Ambulatory Visit
Admit: 2014-03-26 | Disposition: A | Discharge status: Home / Self Care | Payer: Self-pay | Attending: Oncology | Admitting: Oncology

## 2014-04-24 ENCOUNTER — Ambulatory Visit
Admit: 2014-04-24 | Disposition: A | Discharge status: Home / Self Care | Payer: Self-pay | Attending: Oncology | Admitting: Oncology

## 2014-05-16 NOTE — Discharge Summary (Signed)
PATIENT NAME:  [name], [name] [name] DATE OF BIRTH:  Aug 18, 1955  DATE OF ADMISSION:  10/07/2013 DATE OF DISCHARGE:  10/16/2013  DISCHARGE DIAGNOSIS: 1. Rectal cancer, T2, N0, status post abdominal perineal resection.  2. History of anemia.  3. History of kidney stones.  4. History of wrist surgery.  5. History of back surgery.  6. History of bilateral knee surgery.  7. History of cholecystectomy.  8. Postoperative ileus.   INDICATION FOR ADMISSION: Mr. [name] is a pleasant 59 year old with low rectal cancer tracking into the anus and perianal skin and is status post neoadjuvant chemotherapy. He was brought to the operating room for abdominal perineal resection.   HOSPITAL COURSE: Mr. [name] underwent difficult abdominal perineal resection on 10/07/2013. Throughout hospital course, he was initially given clears and IV pain meds; however, he developed nausea, vomiting and plain films indicated an ileus. He resolved ileus prior to discharge and had a high ostomy output and was required to put on Imodium prior to discharge. At the time of discharge, he is tolerating good p.o. with good p.o. pain control and voiding and stooling without difficulties.   DISCHARGE MEDICATIONS: As follows: Flonase 50 mcg inhaled in one spray daily, Valium 5 mg p.o. t.i.d., Zyrtec 10 mg p.o. q. daily,  Percocet 1 tab p.o. q. 6 hours p.r.n. pain, Atropine  diphenoxylate 0.25/2.5 mg 1 tab p.o. q. 6 hours p.r.n. diarrhea.   DISCHARGE INSTRUCTIONS: Mr. [name] is to return to clinic in 1 week for drain removal. He is to call or return to the ED if has increased pain, nausea, vomiting, redness, drainage from incision.    ____________________________ Glena Norfolk Aine Strycharz, MD cal:JT D: 10/21/2013 20:35:06 ET T: 10/22/2013 03:24:34 ET JOB#: [id number]  cc: Harrell Gave A. Dorsie Sethi, MD, <Dictator> Floyde Parkins MD ELECTRONICALLY SIGNED 10/24/2013 1:21

## 2014-05-16 NOTE — Op Note (Signed)
PATIENT NAME:  [name] [name], [name] [name] MR#:  [id number] DATE OF BIRTH:  11-02-1955  DATE OF PROCEDURE:  04/29/2013  ATTENDING PHYSICIAN: Dr. Marlyce Huge  PREOPERATIVE DIAGNOSIS: Anal mass.   POSTOPERATIVE DIAGNOSIS: Anal mass.   PROCEDURE PERFORMED:  1.  Anorectal examination under anesthesia.  2.  Incisional biopsy of anorectal mass.    ESTIMATED BLOOD LOSS: 5 mL.   COMPLICATIONS: None.   SPECIMEN: Incisional biopsy of anal mass, right anus.   ANESTHESIA: General.  COMPLICATIONS: None.  INDICATION FOR SURGERY: The patient is a pleasant 59 year old male with a history of perianal abscesses, who presented with anal pain and, per him, concern of recurrent abscess. He was noted to have a large fungating lesion in his anus, and was brought to the Operating Room for rectal exam under anesthesia and biopsy of fungating anal mass.   DETAILS OF PROCEDURE: Informed consent was obtained. The patient was brought to the Operating Room Suite. He was induced. Endotracheal tube was placed, general anesthesia was administered. He was then put prone, jackknife on the Operating Room table. His anus was prepped and draped in standard surgical fashion. A timeout was then performed, correctly identifying the patient name, operative site and procedure to be performed. A well-lubricated finger was placed in the anus. The mass appeared to be extremely friable on its exophytic portions, but appeared to be fixed and potentially involving the anal sphincters. I then used 1% lidocaine with epinephrine to infiltrate the subcutaneous tissue and anal sphincters to allow dilation. I then placed a Hill-Ferguson retractor into the anus. I looked at all four quadrants. On the right lateral wall,  there was a large fungating mass, approximately 3 cm x 3 cm and attached to the anal skin distal to the dentate line that appeared to fixed, as I mentioned. I then used a sterile scalpel and excised a large proportion of the  exophytic area as well as some small amount of perianal skin and normal mucosa. I then sent this to pathology. I then used a cautery to provide hemostasis. I did use a 3-0 chromic to close the small amount of mucosa and perianal skin that I had removed with the specimen. I then placed bacitracin in the anus, placed a sterile dressing and an abdominal ABD pad and placed mesh underwear. He was then awoken, extubated and brought to the postanesthesia care unit. There were no immediate complications. Needle, sponge, and instrument counts were correct at the end of the procedure.    ____________________________ Glena Norfolk. Essex Perry, MD cal:cg D: 04/30/2013 02:02:36 ET T: 04/30/2013 02:43:59 ET JOB#: [id number]  cc: Harrell Gave A. Lean Fayson, MD, <Dictator> Floyde Parkins MD ELECTRONICALLY SIGNED 05/01/2013 19:27

## 2014-05-16 NOTE — Op Note (Signed)
PATIENT NAME:  [name] [name], [name] [name] MR#:  [id number] DATE OF BIRTH:  02/25/1955  DATE OF PROCEDURE:  06/17/2013  PREOPERATIVE DIAGNOSES:  1.  Rectal cancer, need for chemotherapy.  2.  PET positive sentinel node in right groin.  POSTOPERATIVE DIAGNOSES:  1.  Rectal cancer, need for chemotherapy.  2.  PET positive sentinel node in right groin.  PROCEDURE PERFORMED:  1.  Right groin ultrasound. 2.  Ultrasound guided right internal jugular Port-A-Cath placement.  SURGEON: Marlyce Huge, MD  ESTIMATED BLOOD LOSS: 5 mL.   COMPLICATIONS: None.  SPECIMENS: None.   ANESTHESIA: General endotracheal.   INDICATION FOR PROCEDURE: [name] [name] is a pleasant 59 year old male who presents with a large anal sinus rectal mass and need for chemotherapy. He also was noted to have a PET positive sentinel node in the right groin and therefore he is to undergo possible excisional biopsy of right groin lymph node and right internal jugular Port-A-Cath placement.   DETAILS OF PROCEDURE: [name] [name] after general endotracheal anesthesia was obtained.  He was laid supine on the operating room table. He was induced. Endotracheal tube was placed. General anesthesia was administered. His right groin had ultrasound. No obvious discernible enlarged lymph node was noted. Decision was made to not excise any lymph nodes without identifying an obvious enlarged lymph node. It is not likely that I would find 1 particular node and if I did remove node and it was negative, would this still ensure that he does not have a malignancy-containing lymph node in the groin. My attention then was to focus on Port-A-Cath placement. I prepped his right neck and chest in the standard surgical fashion. A timeout was then performed correctly identifying the patient name, operative site and procedure to be performed. Ultrasound was placed on his right neck. His right internal jugular vein was easily visualized.  It was accessed with a single stick. A wire was then placed through the access needle and a fluoro was used ensure that the wire headed to the cavoatrial junction. The needle was then withdrawn. I then made an incision in the chest for a Port-A-Cath placement. A pocket was made. After the pocket was made, the catheter was tunneled from the pocket to the incision in the right neck. After the catheter was placed through there, the Port-A-Cath was attached to the pocket with 3-0 Prolene. I then proceeded to place the dilator over the wire to dilate the tract. The dilator was then placed over the wire. The dilator and wire were then removed. The catheter was then placed under direct visualization through the trocar. The area at the cavoatrial junction was then attached to the port. The port was then placed in the pocket. The previously placed sutures were tied. The catheter was flushed and a sterile Huber needle and heparinized saline. The pocket was then evaluated for hemostasis. The neck access was then closed with interrupted 3-0 Vicryl. The port was then closed in 3 layers, a deep dermal interrupted 0 Vicryl, a 4-0 Monocryl subcuticular running, and 3 vertical mattress sutures were placed to ensure it would not pull apart with any heavy lifting. Dermabond was then placed over the neck incision. Telfa gauze and a Tegaderm were then placed over the lower incision. The patient was then awoken, extubated and brought to the postanesthesia care unit. There were no immediate complications. Needle, sponge, and instrument counts were correct at the end of the procedure.   ____________________________ Glena Norfolk. Stephany Poorman, MD  cal:sb D: 06/19/2013 07:50:56 ET T: 06/19/2013 08:27:33 ET JOB#: [id number]  cc: Harrell Gave A. Zuri Bradway, MD, <Dictator> Floyde Parkins MD ELECTRONICALLY SIGNED 06/24/2013 17:17

## 2014-05-16 NOTE — Op Note (Signed)
PATIENT NAME:  [name], [name] JARNIGAN MR#:  [id number] DATE OF BIRTH:  1955/05/16  DATE OF PROCEDURE:  10/07/2013  PREOPERATIVE DIAGNOSIS: Low rectal cancer.   POSTOPERATIVE DIAGNOSIS: Low rectal cancer.   PROCEDURE PERFORMED: Robot-assisted abdominal perineal resection.   ANESTHESIA: General.   ESTIMATED BLOOD LOSS: 902 mL    COMPLICATIONS: None.   SPECIMENS:  Rectum and pericolonic lymph node mass.   ASSISTANT:  Dia Crawford, MD   INDICATION FOR SURGERY:  [name] [name] is a pleasant 59 year old male who initially presented with a low rectal, anal mass that had undergone neoadjuvant chemotherapy.  He presented for completion to abdominal perineal resection.   DETAILS OF PROCEDURE:  A full consent was obtained.  [name] [name] was brought to the Operating Room suite.  He was placed upon the Operating Room table.  He was induced. Endotracheal tube was placed, general anesthesia was administered.  His abdomen was prepped and draped in standard surgical fashion as well as his anus.  A timeout was then performed correctly identifying the patient name, operative site and procedure to be performed.  A right periumbilical incision was made.  It was deepened down to the fascia and through the rectus muscle.  The fascia was incised. The peritoneum was entered.  A Hassan trocar was placed through the abdomen.  Two 5 mm left-sided, 1 midclavicular periumbilical and 1 left lower quadrant left axillary line and then 1 right lower quadrant 5 mm and a 10 mm right lower quadrant assistant port were all placed.  The robot was then docked.  Visualization was good.  The instruments were placed through the cannulas and up to the robot.  The next dissection occurred, and I began at left side wall mobilizing the left colon.  I then proceeded down to the rectum.  I then worked posteriorly and dissected with some difficulty. The rectum off the sacrum going first through the peritoneal reflection, and then deeper.  I then worked on both  lateral aspects and used clips and cautery to control any bleeding.  After about 3 hours of tedious robotic dissection, a decision was made to finish open and therefore the rectum was dissected out circumferentially again performing total mesorectal excision.  The colon was then ligated with a stapler.  The IMA was taken with a suture ligature and LigaSure was used to ligate the lateral stalks anteriorly.  The dissection proceeded downwards as well. When I felt I was at the levators, I then proceeded to the anus and performed an elliptical incision. This was deepened down through the soft tissue layers.  The rectum was circumferentially dissected out.  The anococcygeal ligament was ligated.  The levators were taken off the rectum and the specimen was passed out through the anus.  The wound was hemostatic.  It was then closed in layers.  A deeper area approximating the fat, the levators were not able to be reapproximated.  Soft tissue was closed using 0 Vicryl sutures.  Interrupted vertical mattress sutures were used to close the anal skin.  A 10 [place] JP drain was placed in the dead space to collect any fluid.   Next, I proceeded to go back to the abdominal dissection. The left colon was mobilized a bit more so to reach the abdomen, the peritoneal reflection was closed over the pelvis to eliminate any dead space.  I was not able to bring down any omentum to fill the dead space.  Next, the circular incision was made in the left lower quadrant  and the ostomy was then brought out. I then closed the midline with a looped #1 PDS, ran from 1 direction and tied to itself at the end.  I then closed the two 10 mm trocar sites with 0 Vicryl on UR-6 suture figure-of-eights.  I then closed all skin with staples and then matured the ostomy using a 3-0 Monocryl sutures to connect the edges of the colon to the skin.  I did make some attempts to brook the ostomy.  An ostomy appliance was then placed over the wound.  The sterile  dressings were placed over all incisions. The patient was then awoken, extubated and brought to the postanesthesia care unit.  There were no immediate complications.  Needle, sponge, and instrument count was correct at the end of the procedure.     ____________________________ Glena Norfolk. Vada Yellen, MD cal:DT D: 10/09/2013 09:20:43 ET T: 10/09/2013 11:59:03 ET JOB#: [id number]  cc: Harrell Gave A. Josip Merolla, MD, <Dictator> Floyde Parkins MD ELECTRONICALLY SIGNED 10/22/2013 0:58

## 2014-05-16 NOTE — Consult Note (Signed)
Reason for Visit: This 59 year old Male patient presents to the clinic for initial evaluation of  rectal cancer .   Referred by Dr. Payton Emerald.  Diagnosis:  Chief Complaint/Diagnosis   59 year old male with extremely low lying adenocarcinoma of the rectum with question of perirectal lymph nodes and possible right inguinal lymph node hypermetabolic on PET CT scan at least stage III disease.  Pathology Report pathology report reviewed   Imaging Report PET CT scan reviewed   Referral Report clinical notes reviewed   Planned Treatment Regimen concurrent chemotherapy and radiation therapy in a neoadjuvant setting   HPI   patient is a 59 year old male who's had a history of perirectal abscess x2 recently presented with rectal bleeding and pain on defecation. He was seen by surgeon and was found a large mass in the lower rectum at the anal verge biopsy positive 4 mucinous adenocarcinoma.patient not have a PET CT scan showing hypermetabolic measle rectal lymph node which will certainly represents a nodal metastasis as well as borderline enlarged right inguinal node again demonstrating hypermetabolic activity. There is also a spot ill-defined in the left ilium which was slightly hypermetabolic and could represent a bone metastasis. Patient sent endoscopic ultrasound do not have that report for my records on PET CT scan this is a large mass with probable extension to the muscularis propria. Patient is seen today for opinion regarding radiation therapy with concurrent chemotherapy in a neoadjuvant setting. He is doing well at the present time he feels his pain is somewhat improved at this time. He is scheduled for port placement as well as biopsy of his right inguinal node.  Past Hx:    perianal abscess:    Anemia: as a child   Kidney Stones:    Rectal biopsy: Apr 2015   I & D perirectal abscess:    wrist surgery:    Back Surgery:    Knee Surgery - Right:    Knee Surgery - Left:     Gall Bladder:   Past, Family and Social History:  Past Medical History positive   Gastrointestinal perirectal abscess   Genitourinary kidney stones   Past Surgical History cholecystectomy; wrist back and bilateral knee surgery   Past Medical History Comments history of anemia   Family History positive   Family History Comments father with prostate cancer family history COPD and congestive heart failure   Social History positive   Social History Comments 30-pack-year smoking history no EtOH abuse history   Additional Past Medical and Surgical History accompanied by his wife today   Allergies:   Sulfa: Other  Home Meds:  Home Medications: Medication Instructions Status  acetaminophen-oxyCODONE 325 mg-5 mg oral tablet 1 tab(s) orally every 6 hours, As needed, pain Active  lidocaine 2% topical gel with applicator 1 application topically every 6 hours, As needed, anal pain Active  Valium 5 mg oral tablet 1  orally 3 times a day, As Needed Active  clindamycin 150 mg oral capsule 1 cap(s) orally 4 times a day Active   Review of Systems:  General negative   Performance Status (ECOG) 0   Skin negative   Breast negative   Ophthalmologic negative   ENMT negative   Respiratory and Thorax negative   Cardiovascular negative   Gastrointestinal see HPI   Genitourinary negative   Musculoskeletal negative   Neurological negative   Psychiatric negative   Hematology/Lymphatics negative   Endocrine negative   Allergic/Immunologic negative   Review of Systems   denies any  weight loss, fatigue, weakness, fever, chills or night sweats. Patient denies any loss of vision, blurred vision. Patient denies any ringing  of the ears or hearing loss. No irregular heartbeat. Patient denies heart murmur or history of fainting. Patient denies any chest pain or pain radiating to her upper extremities. Patient denies any shortness of breath, difficulty breathing at night, cough or  hemoptysis. Patient denies any swelling in the lower legs. Patient denies any nausea vomiting, vomiting of blood, or coffee ground material in the vomitus. Patient denies any stomach pain. Patient states has had normal bowel movements no significant constipation or diarrhea. Patient denies any dysuria, hematuria or significant nocturia. Patient denies any problems walking, swelling in the joints or loss of balance. Patient denies any skin changes, loss of hair or loss of weight. Patient denies any excessive worrying or anxiety or significant depression. Patient denies any problems with insomnia. Patient denies excessive thirst, polyuria, polydipsia. Patient denies any swollen glands, patient denies easy bruising or easy bleeding. Patient denies any recent infections, allergies or URI. Patient "s visual fields have not changed significantly in recent time.   Nursing Notes:  Nursing Vital Signs and Chemo Nursing Nursing Notes: *CC Vital Signs Flowsheet:   22-May-15 11:09  Temp Temperature 97  Pulse Pulse 90  Respirations Respirations 20  SBP SBP 156  DBP DBP 89  Pain Scale (0-10)  0  Pulse Oxi  100  Current Weight (kg) (kg) 116.3  Height (cm) centimeters 192  BSA (m2) 2.4   Physical Exam:  General/Skin/HEENT:  General normal   Skin normal   Eyes normal   ENMT normal   Head and Neck normal   Additional PE well-developed well-nourished male in NAD. Lungs are clear to A&P cardiac examination shows regular rate and rhythm. I can not detect any specific adenopathy in the right inguinal region. On rectal exam there is a large mass at the anal verge partially obstructing consistent with known primary rectal cancer.   Breasts/Resp/CV/GI/GU:  Respiratory and Thorax normal   Cardiovascular normal   Gastrointestinal normal   Genitourinary normal   MS/Neuro/Psych/Lymph:  Musculoskeletal normal   Neurological normal   Lymphatics normal   Other Results:  Radiology  Results: LabUnknown:    21-Apr-15 11:19, PET/CT Staging Anus  PACS Image   Nuclear Med:  PET/CT Staging Anus   REASON FOR EXAM:    initial staging anal CA  COMMENTS:       PROCEDURE: PET - PET/CT STAGING ANUS  - May 13 2013 11:19AM     CLINICAL DATA:  Initial treatment strategy for anal cancer.    EXAM:  NUCLEAR MEDICINE PET SKULL BASE TO THIGH    TECHNIQUE:  12.4 MCi F-18 FDG was injected intravenously. Full-ring PET imaging  was performed from the skull base to thigh after the radiotracer. CT  data was obtained and used for attenuation correction and anatomic  localization.  FASTING BLOOD GLUCOSE:  Value: 89 mg/dl    COMPARISON:  CT of the abdomen and pelvis 03/11/2005.    FINDINGS:  NECK    No hypermetabolic lymph nodes in the neck.    CHEST    No hypermetabolic mediastinal or hilar nodes. 2 mm nodule in the  right middle lobe (image 135 of series 3). 3 mm nodule in the  inferior segment of the lingula (image 124 of series 3). No other  suspicious pulmonary nodules on the CT scan. Heart size is normal.  There is no significant pericardial fluid, thickening or  pericardial  calcification. Esophagus is unremarkable in appearance.    ABDOMEN/PELVIS    No abnormal hypermetabolic activity within the liver, pancreas,  adrenal glands, or spleen. No hypermetabolic lymph nodes in the  abdomen. Status post cholecystectomy. No significant volume of  ascites. No pneumoperitoneum. No pathologic distention of small  bowel. Numerous colonic diverticulae are noted, most pronounced in  the descending colon and sigmoid colon, without surrounding  inflammatory changes to suggest an acute diverticulitis at this  time.    There appears to be a mass at the level of the anus extending  downward into the perineal fat slightly to the right of midline,  measuring up to at least 3.2 x 3.5 cm on axial images, which is  diffusely hypermetabolic (SUVmax = 36.1). There is some  associated  hypermetabolism in the distal rectum shortly above the anal verge  (SUVmax = 6.5), which could be related to upward extension of  lesion, or could be inflammatory. Image 252 of series 3 demonstrates  a 7 mm lymph node in the mesorectal fat that is hypermetabolic  (SUVmax = 3.9). There is also a 1 cm short axis right inguinal lymph  node (image 270 of series 3), which is mildly hypermetabolic (SUVmax  = 4.3).    SKELETON    A 2.4 cm ill-defined somewhat sclerotic lesion inthe left ilium  just above the left acetabulum is hypermetabolic (SUVmax = 4.4)RX  the subcutaneous fat of the lumbar region there is a fatty  attenuation lesion measuring approximately 2.6 x 1.4 cm (image 2 a 2  of series 3) which has an internal focus of low-level metabolic  activity (SUVmax = 2.7), which is nonspecific, favored to represent  a focus of fat necrosis.     IMPRESSION:  1. Large mass at the anal verge, predominantly extending inferiorly  into the perineal fat is hypermetabolic, compatible with the  reported primary anal neoplasm. There may be some mild cephalad  extension above the level of the anal verge, as there is some  hypermetabolic activity within the distal rectum. In addition, there  is an a nonenlarged but hypermetabolic meso rectal lymph node which  almost certainly represents a nodal metastasis. There is also a  borderline enlarged right inguinal lymph node demonstrating  hypermetabolic activity, also concerning for metastatic involvement.  As well, there is anill-defined slightly sclerotic lesion in the  left ilium which is hypermetabolic, which could represent a bony  metastasis.  2. No definite signs of distal metastatic disease otherwise noted in  the neck, chest or abdomen, although there are 2 tiny nonspecific  pulmonary nodules which warrant attention on followup studies, as  above.  3. Colonic diverticulosis without findings to suggest acute  diverticulitis at this  time.  4. Additional incidental findings, as above.      Electronically Signed   By: Vinnie Langton M.D.    On: 05/13/2013 13:18         Verified By: Etheleen Mayhew, M.D.,   Relevent Results:   Relevant Scans and Labs PET CT scan is reviewed and compatible with above-stated findings   Assessment and Plan: Impression:   at least stage III adenocarcinoma of the rectum with encroachment on the anal verge and possible inguinal little involvement in 59 year old male for neoadjuvant chemoradiation and then AP resection Plan:   at this time I have discussed the case personally with medical oncology. Dr. Grayland Ormond will ask surgeon to evaluate and biopsy his right inguinal lymph node since this  would change my set up fields. Would plan on delivering up to 5400 cGy to his primary tumor and pelvic lymph nodes using three-dimensional treatment planning. Risks and benefits of treatment were explained to the patient and his wife. Side effects including diarrhea, increasing urinary frequency and urgency, fatigue, skin reaction, and inflammation in the anal region all were discussed in detail with the patient and his wife. They both seem to comprehend my treatment plan well. I have set him up for CT simulation in about a week's time he'll have a Port-A-Cath placed and biopsy was inguinal node next week.  I would like to take this opportunity for allowing me to participate in the care of your patient..  CC Referral:  cc: Dr. Fransico Meadow. Vincente Liberty Morrisey   Electronic Signatures: Baruch Gouty, Roda Shutters (MD)  (Signed [phone] 13:08)  Authored: HPI, Diagnosis, Past Hx, PFSH, Allergies, Home Meds, ROS, Nursing Notes, Physical Exam, Other Results, Relevent Results, Encounter Assessment and Plan, CC Referring Physician   Last Updated: 22-May-15 13:08 by Armstead Peaks (MD)

## 2014-06-30 ENCOUNTER — Telehealth: Payer: Self-pay | Admitting: Surgery

## 2014-06-30 NOTE — Telephone Encounter (Signed)
Order received for ostomy supplies and was signed and faxed back at this time. Completed form will be scanned to chart.

## 2014-07-06 ENCOUNTER — Encounter: Payer: Self-pay | Admitting: Surgery

## 2014-07-20 ENCOUNTER — Ambulatory Visit
Admission: RE | Admit: 2014-07-20 | Discharge: 2014-07-20 | Disposition: A | Discharge status: Home / Self Care | Payer: 59 | Source: Ambulatory Visit | Attending: Radiation Oncology | Admitting: Radiation Oncology

## 2014-07-20 ENCOUNTER — Encounter: Payer: Self-pay | Admitting: Radiation Oncology

## 2014-07-20 VITALS — BP 134/84 | HR 76 | Temp 96.6°F | Resp 20 | Ht 75.0 in | Wt 269.8 lb

## 2014-07-20 DIAGNOSIS — C20 Malignant neoplasm of rectum: Secondary | ICD-10-CM

## 2014-07-20 HISTORY — DX: Malignant (primary) neoplasm, unspecified: C80.1

## 2014-07-20 NOTE — Addendum Note (Signed)
Encounter addended by: Daiva Huge, RN on: 07/20/2014 11:17 AM<BR>     Documentation filed: Medications

## 2014-07-20 NOTE — Progress Notes (Signed)
Radiation Oncology Follow up Note  Name: [name] [name]   Date:   07/20/2014 MRN:  [id number] DOB: [birth year]    This 59 y.o. male presents to the clinic today for follow-up for rectal cancer status post induction chemoradiation 1 year prior and then surgical resection with colostomy.[name]  REFERRING PROVIDER: No ref. provider found  HPI: Patient is a 59 year old male had an extremely low-lying adenocarcinoma the rectum with questional perirectal lymph nodes and possible right inguinal nodes hypermetabolic on PET CT scan. Initially a stage III disease. He underwent preoperative chemoradiation and then underwent abdominal perineal resection with surgical pathology showing residual 1.5 cm of mucinous adenocarcinoma with focal pagetoid involvement And extension into superficial muscularis propria. 2 lymph nodes were negative. Postoperatively it was a T2 N0 lesion. Patient has done well and is now 1 year out of treatment. His colostomy is functioning well. No lower urinary tract symptoms noted.  COMPLICATIONS OF TREATMENT: none  FOLLOW UP COMPLIANCE: keeps appointments   PHYSICAL EXAM:  BP 134/84 mmHg  Pulse 76  Temp(Src) 96.6 F (35.9 C)  Resp 20  Ht '6\' 3"'$  (1.905 m)  Wt 269 lb 13.5 oz (122.4 kg)  BMI 33.73 kg/m2 Well-developed well-nourished patient in NAD. HEENT reveals PERLA, EOMI, discs not visualized.  Oral cavity is clear. No oral mucosal lesions are identified. Neck is clear without evidence of cervical or supraclavicular adenopathy. Lungs are clear to A&P. Cardiac examination is essentially unremarkable with regular rate and rhythm without murmur rub or thrill. Abdomen is benign with no organomegaly or masses noted. Motor sensory and DTR levels are equal and symmetric in the upper and lower extremities. Cranial nerves II through XII are grossly intact. Proprioception is intact. No peripheral adenopathy or edema is identified. No motor or sensory levels are noted. Crude visual fields are  within normal range. Abdomen is benign he has a functioning colostomy within normal limits. No evidence of inguinal adenopathy is noted. No peripheral edema in his lower extremities is noted.  RADIOLOGY RESULTS: No recent radiology reports are available for review  PLAN: At the present time 1 year out he continues to do well with no evidence of disease. I am please was overall progress. I've asked to see him back in 1 year for follow-up. He continues close follow-up care with medical oncology and surgeons. Patient is to call sooner with any concerns.  I would like to take this opportunity for allowing me to participate in the care of your patient.Armstead Peaks., MD

## 2014-07-28 ENCOUNTER — Inpatient Hospital Stay: Payer: 59 | Attending: Oncology

## 2014-07-28 ENCOUNTER — Inpatient Hospital Stay (HOSPITAL_BASED_OUTPATIENT_CLINIC_OR_DEPARTMENT_OTHER): Payer: 59 | Admitting: Oncology

## 2014-07-28 ENCOUNTER — Encounter: Payer: Self-pay | Admitting: Oncology

## 2014-07-28 VITALS — BP 171/91 | HR 68 | Temp 97.1°F | Resp 16 | Wt 274.9 lb

## 2014-07-28 DIAGNOSIS — Z79899 Other long term (current) drug therapy: Secondary | ICD-10-CM | POA: Insufficient documentation

## 2014-07-28 DIAGNOSIS — C20 Malignant neoplasm of rectum: Secondary | ICD-10-CM | POA: Insufficient documentation

## 2014-07-28 DIAGNOSIS — Z933 Colostomy status: Secondary | ICD-10-CM

## 2014-07-28 DIAGNOSIS — Z87891 Personal history of nicotine dependence: Secondary | ICD-10-CM | POA: Insufficient documentation

## 2014-07-28 LAB — CBC WITH DIFFERENTIAL/PLATELET
Basophils Absolute: 0.1 10*3/uL (ref 0–0.1)
Basophils Relative: 2 %
EOS PCT: 5 %
Eosinophils Absolute: 0.2 10*3/uL (ref 0–0.7)
HCT: 42.4 % (ref 40.0–52.0)
Hemoglobin: 14.2 g/dL (ref 13.0–18.0)
LYMPHS ABS: 0.5 10*3/uL — AB (ref 1.0–3.6)
Lymphocytes Relative: 18 %
MCH: 29.3 pg (ref 26.0–34.0)
MCHC: 33.6 g/dL (ref 32.0–36.0)
MCV: 87.2 fL (ref 80.0–100.0)
MONO ABS: 0.3 10*3/uL (ref 0.2–1.0)
Monocytes Relative: 12 %
Neutro Abs: 1.9 10*3/uL (ref 1.4–6.5)
Neutrophils Relative %: 63 %
Platelets: 325 10*3/uL (ref 150–440)
RBC: 4.86 MIL/uL (ref 4.40–5.90)
RDW: 14.8 % — AB (ref 11.5–14.5)
WBC: 3 10*3/uL — AB (ref 3.8–10.6)

## 2014-07-28 LAB — COMPREHENSIVE METABOLIC PANEL
ALT: 13 U/L — AB (ref 17–63)
ANION GAP: 5 (ref 5–15)
AST: 16 U/L (ref 15–41)
Albumin: 4.1 g/dL (ref 3.5–5.0)
Alkaline Phosphatase: 62 U/L (ref 38–126)
BILIRUBIN TOTAL: 0.6 mg/dL (ref 0.3–1.2)
BUN: 17 mg/dL (ref 6–20)
CALCIUM: 8.5 mg/dL — AB (ref 8.9–10.3)
CO2: 27 mmol/L (ref 22–32)
Chloride: 104 mmol/L (ref 101–111)
Creatinine, Ser: 1.08 mg/dL (ref 0.61–1.24)
GFR calc non Af Amer: >60 mL/min (ref >60)
Glucose, Bld: 105 mg/dL — ABNORMAL HIGH (ref 65–99)
Potassium: 4.4 mmol/L (ref 3.5–5.1)
SODIUM: 136 mmol/L (ref 135–145)
Total Protein: 6.9 g/dL (ref 6.5–8.1)

## 2014-07-28 MED ORDER — DIAZEPAM 5 MG PO TABS: 5.0000 mg | ORAL_TABLET | Freq: Three times a day (TID) | ORAL | Status: DC | PRN

## 2014-07-29 LAB — CEA: CEA: 1.8 ng/mL (ref 0.0–4.7)

## 2014-08-05 ENCOUNTER — Telehealth: Payer: Self-pay | Admitting: Gastroenterology

## 2014-08-05 ENCOUNTER — Encounter: Payer: Self-pay | Admitting: Gastroenterology

## 2014-08-05 ENCOUNTER — Other Ambulatory Visit: Payer: Self-pay

## 2014-08-05 DIAGNOSIS — C20 Malignant neoplasm of rectum: Secondary | ICD-10-CM | POA: Insufficient documentation

## 2014-08-05 NOTE — Progress Notes (Signed)
[facility]  [phone] [phone]  ID: Taz Vanness Demario OB: December 05, 1955  MR#: [id number]  QJJ#:[id number]  Patient Care Team: Ashok Norris, MD as PCP - General (Family Medicine)  CHIEF COMPLAINT:  Chief Complaint  Patient presents with  . Follow-up    rectal cancer    INTERVAL HISTORY: Patient returns to clinic today for laboratory work and routine 3 month evaluation. He currently feels well and is asymptomatic. He is having no problems with his colostomy. He denies any recent fevers.  He has no neurologic complaints.  He denies any chest pain or shortness of breath.  He denies any nausea or vomiting. He has no urinary complaints.  Patient offers no specific complaints today.  REVIEW OF SYSTEMS:   Review of Systems  Constitutional: Negative.   Respiratory: Negative.   Cardiovascular: Negative.   Gastrointestinal: Negative for diarrhea, constipation, blood in stool and melena.    As per HPI. Otherwise, a complete review of systems is negatve.  PAST MEDICAL HISTORY: Past Medical History  Diagnosis Date  . Cancer     rectal  . Anemia   . Kidney stones     PAST SURGICAL HISTORY: Past Surgical History  Procedure Laterality Date  . Cholecystectomy      FAMILY HISTORY: Father with prostate cancer.  COPD, CHF.     ADVANCED DIRECTIVES:    HEALTH MAINTENANCE: History  Substance Use Topics  . Smoking status: Former Research scientist (life sciences)  . Smokeless tobacco: Current User  . Alcohol Use: No     Colonoscopy:  PAP:  Bone density:  Lipid panel:  Allergies  Allergen Reactions  . Sulfa Antibiotics     Other reaction(s): Other (qualifier value)    Current Outpatient Prescriptions  Medication Sig Dispense Refill  . diazepam (VALIUM) 5 MG tablet Take 1 tablet (5 mg total) by mouth every 8 (eight) hours as needed for anxiety. 30 tablet 0  . oxyCODONE-acetaminophen (PERCOCET/ROXICET) 5-325 MG per tablet Take 1 tablet by mouth every 6 (six)  hours as needed for severe pain.     No current facility-administered medications for this visit.    OBJECTIVE: Filed Vitals:   07/28/14 0934  BP: 171/91  Pulse: 68  Temp: 97.1 F (36.2 C)  Resp: 16     Body mass index is 34.36 kg/(m^2).    ECOG FS:0 - Asymptomatic  General: Well-developed, well-nourished, no acute distress. Eyes: anicteric sclera. Lungs: Clear to auscultation bilaterally. Heart: Regular rate and rhythm. No rubs, murmurs, or gallops. Abdomen: Soft, nontender, nondistended. No organomegaly noted, normoactive bowel sounds. Colostomy noted. Musculoskeletal: No edema, cyanosis, or clubbing. Neuro: Alert, answering all questions appropriately. Cranial nerves grossly intact. Skin: No rashes or petechiae noted. Psych: Normal affect. Lymphatics: No cervical, calvicular, axillary or inguinal LAD.   LAB RESULTS:  Lab Results  Component Value Date   NA 136 07/28/2014   K 4.4 07/28/2014   CL 104 07/28/2014   CO2 27 07/28/2014   GLUCOSE 105* 07/28/2014   BUN 17 07/28/2014   CREATININE 1.08 07/28/2014   CALCIUM 8.5* 07/28/2014   PROT 6.9 07/28/2014   ALBUMIN 4.1 07/28/2014   AST 16 07/28/2014   ALT 13* 07/28/2014   ALKPHOS 62 07/28/2014   BILITOT 0.6 07/28/2014   GFRNONAA >60 07/28/2014   GFRAA >60 07/28/2014    Lab Results  Component Value Date   WBC 3.0* 07/28/2014   NEUTROABS 1.9 07/28/2014   HGB 14.2 07/28/2014   HCT 42.4 07/28/2014   MCV 87.2 07/28/2014  PLT 325 07/28/2014     STUDIES: No results found.  ASSESSMENT: Pathologic stage I rectal adenocarcinoma at the anal verge. (previously clinical stage IIIa)   PLAN:    1.  Rectal cancer: Patient completed neoadjuvant 5-FU and XRT as well as surgery. Pathologic stage as above. Previously patient declined adjuvant FOLFOX therapy. He expressed understanding of the increased risk of recurrence by foregoing chemotherapy. No further intervention is needed at this time. Return to clinic in 3 months  for laboratory work and routine evaluation. Will repeat one final CT scan prior to next visit. If negative, no further imaging will be necessary.  2. Weight loss: Resolved. 3. Diarrhea: Resolved. 4. Colostomy: Continue stoma care as indicated.  Patient expressed understanding and was in agreement with this plan. He also understands that He can call clinic at any time with any questions, concerns, or complaints.    Lloyd Huger, MD   08/05/2014 8:40 AM

## 2014-08-05 NOTE — Telephone Encounter (Signed)
Colonoscopy Triage - Patient called in, he has had rectal cancer in the past year requiring surgery with Dr Rexene Edison. Dr Grayland Ormond is now requesting a colonoscopy the end of September. I spoke with Amber and she stated with his hx of cancer Dr Allen Norris may want to see him in the office before colonoscopy.

## 2014-08-05 NOTE — Telephone Encounter (Signed)
Gastroenterology Pre-Procedure Review  Request Date: 10-13-14 Requesting Physician: Dr. Rutherford Nail  PATIENT REVIEW QUESTIONS: The patient responded to the following health history questions as indicated:    1. Are you having any GI issues? no 2. Do you have a personal history of Polyps? no 3. Do you have a family history of Colon Cancer or Polyps? no 4. Diabetes Mellitus? no 5. Joint replacements in the past 12 months?no 6. Major health problems in the past 3 months?no 7. Any artificial heart valves, MVP, or defibrillator?no    MEDICATIONS & ALLERGIES:    Patient reports the following regarding taking any anticoagulation/antiplatelet therapy:   Plavix, Coumadin, Eliquis, Xarelto, Lovenox, Pradaxa, Brilinta, or Effient? no Aspirin? no  Patient confirms/reports the following medications:  Current Outpatient Prescriptions  Medication Sig Dispense Refill  . diazepam (VALIUM) 5 MG tablet Take 1 tablet (5 mg total) by mouth every 8 (eight) hours as needed for anxiety. 30 tablet 0  . oxyCODONE-acetaminophen (PERCOCET/ROXICET) 5-325 MG per tablet Take 1 tablet by mouth every 6 (six) hours as needed for severe pain.     No current facility-administered medications for this visit.    Patient confirms/reports the following allergies:  Allergies  Allergen Reactions  . Sulfa Antibiotics     Other reaction(s): Other (qualifier value)    No orders of the defined types were placed in this encounter.    AUTHORIZATION INFORMATION Primary Insurance: 1D#: Group #:  Secondary Insurance: 1D#: Group #:  SCHEDULE INFORMATION: Date: 10-13-14 Time: Location: [place]

## 2014-08-17 ENCOUNTER — Telehealth: Payer: Self-pay | Admitting: Gastroenterology

## 2014-08-17 NOTE — Telephone Encounter (Signed)
Pt LVM Friday that he needs to change the time for his colonoscopy on 10/13/2014.

## 2014-08-17 NOTE — Telephone Encounter (Signed)
Pt requested colonoscopy to be changed to 10-06-14. [facility] called and changed.

## 2014-10-05 ENCOUNTER — Telehealth: Payer: Self-pay | Admitting: Gastroenterology

## 2014-10-05 NOTE — Telephone Encounter (Signed)
Patient called to cancel his colonoscopy scheduled for tomorrow 10/06/2014 - I called Ginger to let her know and she is going to call and reschedule.

## 2014-10-06 ENCOUNTER — Encounter: Admission: RE | Payer: Self-pay | Source: Ambulatory Visit

## 2014-10-06 ENCOUNTER — Telehealth: Payer: Self-pay | Admitting: Gastroenterology

## 2014-10-06 ENCOUNTER — Ambulatory Visit: Admission: RE | Admit: 2014-10-06 | Payer: 59 | Source: Ambulatory Visit | Admitting: Gastroenterology

## 2014-10-06 SURGERY — COLONOSCOPY WITH PROPOFOL
Anesthesia: General

## 2014-10-06 NOTE — Telephone Encounter (Signed)
Patient wants to reschedule his colonoscopy

## 2014-10-08 NOTE — Telephone Encounter (Signed)
Pt returned my call regarding rescheduling his colonoscopy. He stated he received the wrong appliance to assist with his colon prep. He will call me back to reschedule when he receives this.

## 2014-10-08 NOTE — Telephone Encounter (Signed)
LVM for pt to return call to reschedule colonoscopy.

## 2014-10-19 ENCOUNTER — Telehealth: Payer: Self-pay | Admitting: *Deleted

## 2014-10-19 NOTE — Telephone Encounter (Signed)
Thought he was to have a PET scan, but a CT abd is ordered. Is this correct?

## 2014-10-22 ENCOUNTER — Inpatient Hospital Stay: Payer: 59

## 2014-10-22 ENCOUNTER — Ambulatory Visit
Admission: RE | Admit: 2014-10-22 | Discharge: 2014-10-22 | Disposition: A | Discharge status: Home / Self Care | Payer: 59 | Source: Ambulatory Visit | Attending: Oncology | Admitting: Oncology

## 2014-10-22 DIAGNOSIS — C20 Malignant neoplasm of rectum: Secondary | ICD-10-CM | POA: Insufficient documentation

## 2014-10-22 MED ORDER — IOHEXOL 350 MG/ML SOLN
100.0000 mL | Freq: Once | INTRAVENOUS | Status: AC | PRN
  Administered 2014-10-22: 100 mL via INTRAVENOUS

## 2014-10-26 ENCOUNTER — Inpatient Hospital Stay: Payer: 59 | Attending: Oncology | Admitting: Oncology

## 2014-10-26 ENCOUNTER — Inpatient Hospital Stay: Payer: 59

## 2014-10-26 VITALS — BP 148/94 | HR 76 | Temp 96.9°F | Wt 280.3 lb

## 2014-10-26 DIAGNOSIS — Z9221 Personal history of antineoplastic chemotherapy: Secondary | ICD-10-CM | POA: Diagnosis not present

## 2014-10-26 DIAGNOSIS — C20 Malignant neoplasm of rectum: Secondary | ICD-10-CM

## 2014-10-26 DIAGNOSIS — Z923 Personal history of irradiation: Secondary | ICD-10-CM | POA: Diagnosis not present

## 2014-10-26 DIAGNOSIS — Z933 Colostomy status: Secondary | ICD-10-CM | POA: Diagnosis not present

## 2014-10-26 LAB — CBC WITH DIFFERENTIAL/PLATELET
BASOS ABS: 0.1 10*3/uL (ref 0–0.1)
BASOS PCT: 1 %
EOS ABS: 0.1 10*3/uL (ref 0–0.7)
EOS PCT: 4 %
HCT: 42.8 % (ref 40.0–52.0)
HEMOGLOBIN: 14.6 g/dL (ref 13.0–18.0)
Lymphocytes Relative: 14 %
Lymphs Abs: 0.5 10*3/uL — ABNORMAL LOW (ref 1.0–3.6)
MCH: 29.6 pg (ref 26.0–34.0)
MCHC: 34 g/dL (ref 32.0–36.0)
MCV: 87 fL (ref 80.0–100.0)
Monocytes Absolute: 0.3 10*3/uL (ref 0.2–1.0)
Monocytes Relative: 9 %
NEUTROS PCT: 72 %
Neutro Abs: 2.7 10*3/uL (ref 1.4–6.5)
PLATELETS: 324 10*3/uL (ref 150–440)
RBC: 4.91 MIL/uL (ref 4.40–5.90)
RDW: 14.9 % — ABNORMAL HIGH (ref 11.5–14.5)
WBC: 3.8 10*3/uL (ref 3.8–10.6)

## 2014-10-26 MED ORDER — DIAZEPAM 5 MG PO TABS: 5.0000 mg | ORAL_TABLET | Freq: Three times a day (TID) | ORAL | Status: DC | PRN

## 2014-10-27 LAB — CEA: CEA: 1.4 ng/mL (ref 0.0–4.7)

## 2014-11-02 NOTE — Progress Notes (Signed)
[facility]  [phone] [phone]  ID: [name] [name] OB: September 17, 1955  MR#: [id number]  JGO#:[id number]  Patient Care Team: Ashok Norris, MD as PCP - General (Family Medicine)  CHIEF COMPLAINT:  Chief Complaint  Patient presents with  . Follow-up    get scan results    INTERVAL HISTORY: Patient returns to clinic today for laboratory work and routine 3 month evaluation. He continues to feel well and is asymptomatic. He is having no problems with his colostomy. He denies any recent fevers.  He has no neurologic complaints.  He denies any chest pain or shortness of breath.  He denies nausea, vomiting, constipation, or diarrhea. He has noted no blood or melena in his colostomy bag.  He has no urinary complaints.  Patient offers no specific complaints today.  REVIEW OF SYSTEMS:   Review of Systems  Constitutional: Negative.   Respiratory: Negative.   Cardiovascular: Negative.   Gastrointestinal: Negative for diarrhea, constipation, blood in stool and melena.    As per HPI. Otherwise, a complete review of systems is negatve.  PAST MEDICAL HISTORY: Past Medical History  Diagnosis Date  . Anemia   . Kidney stones   . Cancer     rectal 2015    PAST SURGICAL HISTORY: Past Surgical History  Procedure Laterality Date  . Cholecystectomy      FAMILY HISTORY: Father with prostate cancer.  COPD, CHF.     ADVANCED DIRECTIVES:    HEALTH MAINTENANCE: Social History  Substance Use Topics  . Smoking status: Never Smoker   . Smokeless tobacco: Current User  . Alcohol Use: No     Colonoscopy:  PAP:  Bone density:  Lipid panel:  Allergies  Allergen Reactions  . Sulfa Antibiotics     Other reaction(s): Other (qualifier value)    Current Outpatient Prescriptions  Medication Sig Dispense Refill  . diazepam (VALIUM) 5 MG tablet Take 1 tablet (5 mg total) by mouth every 8 (eight) hours as needed for anxiety. 30 tablet 0  .  Loperamide HCl (IMODIUM A-D PO) Take by mouth as needed.    [name] oxyCODONE-acetaminophen (PERCOCET/ROXICET) 5-325 MG per tablet Take 1 tablet by mouth every 6 (six) hours as needed for severe pain.     No current facility-administered medications for this visit.    OBJECTIVE: Filed Vitals:   10/26/14 1050  BP: 148/94  Pulse: 76  Temp: 96.9 F (36.1 C)     Body mass index is 35.04 kg/(m^2).    ECOG FS:0 - Asymptomatic  General: Well-developed, well-nourished, no acute distress. Eyes: anicteric sclera. Lungs: Clear to auscultation bilaterally. Heart: Regular rate and rhythm. No rubs, murmurs, or gallops. Abdomen: Soft, nontender, nondistended. No organomegaly noted, normoactive bowel sounds. Colostomy noted. Musculoskeletal: No edema, cyanosis, or clubbing. Neuro: Alert, answering all questions appropriately. Cranial nerves grossly intact. Skin: No rashes or petechiae noted. Psych: Normal affect. Lymphatics: No cervical, calvicular, axillary or inguinal LAD.   LAB RESULTS:  Lab Results  Component Value Date   NA 136 07/28/2014   K 4.4 07/28/2014   CL 104 07/28/2014   CO2 27 07/28/2014   GLUCOSE 105* 07/28/2014   BUN 17 07/28/2014   CREATININE 1.08 07/28/2014   CALCIUM 8.5* 07/28/2014   PROT 6.9 07/28/2014   ALBUMIN 4.1 07/28/2014   AST 16 07/28/2014   ALT 13* 07/28/2014   ALKPHOS 62 07/28/2014   BILITOT 0.6 07/28/2014   GFRNONAA >60 07/28/2014   GFRAA >60 07/28/2014    Lab Results  Component Value Date   WBC 3.8 10/26/2014   NEUTROABS 2.7 10/26/2014   HGB 14.6 10/26/2014   HCT 42.8 10/26/2014   MCV 87.0 10/26/2014   PLT 324 10/26/2014     STUDIES: Ct Abdomen Pelvis W Contrast  10/22/2014   CLINICAL DATA:  Patient status post abdominal peroneal resection for low rectal/anal mass status post chemotherapy.  EXAM: CT ABDOMEN AND PELVIS WITH CONTRAST  TECHNIQUE: Multidetector CT imaging of the abdomen and pelvis was performed using the standard protocol following  bolus administration of intravenous contrast.  CONTRAST:  152m OMNIPAQUE IOHEXOL 350 MG/ML SOLN  COMPARISON:  PET-CT 05/13/2013; CT abdomen pelvis 08/12/2013.  FINDINGS: Lower chest: Minimal dependent atelectasis. No pleural effusion. Normal heart size.  Hepatobiliary: Liver is normal in attenuation without focal hepatic lesion identified. Status post cholecystectomy.  Pancreas: Unremarkable  Spleen: Unremarkable  Adrenals/Urinary Tract: Adrenal glands are normal. The kidneys enhance symmetrically contrast. No hydronephrosis. Mild circumferential bladder wall thickening. 2 mm nonobstructing stone inferior pole right kidney.  Stomach/Bowel: Left lower quadrant colostomy. Descending colonic diverticulosis. The appendix is normal. Oral contrast material is demonstrated throughout majority of the small and large bowel without evidence for obstruction. Stomach is unremarkable. No free intraperitoneal air.  Vascular/Lymphatic: Normal caliber abdominal aorta. No definite retroperitoneal lymphadenopathy.  Other: Postsurgical changes within the pelvis. There is approximately 3 cm of presacral soft tissue thickening (image 81; series 2). Slightly more superiorly within the left aspect of the hemipelvis there is a 4.4 x 4.5 cm irregular soft tissue mass which appears to abut and encase a portion of left internal iliac artery. Small amount of associated fluid within the pelvis.  Musculoskeletal: Fat containing left inguinal hernia. Unchanged 2.1 cm vague area of sclerosis within the left acetabulum, nonspecific but likely benign etiology given stability.  IMPRESSION: Interval postsurgical changes. There is new presacral soft tissue thickening as well as an irregular soft tissue mass within the left aspect of pelvis. There is a small amount of lower attenuation material associated with the left hemi-pelvic mass, likely associated fluid. Findings are nonspecific as this is the initial follow-up examination however they may be  postoperative in etiology or represent localized recurrence.  No evidence for distant metastatic disease within the abdomen or pelvis.  Unchanged indeterminate left acetabular sclerotic lesion, favored to be benign in etiology. Recommend continued attention on followup.   Electronically Signed   By: DLovey NewcomerM.D.   On: 10/22/2014 11:59    ASSESSMENT: Pathologic stage I rectal adenocarcinoma at the anal verge. (previously clinical stage IIIa)   PLAN:    1.  Rectal cancer: Patient completed neoadjuvant 5-FU and XRT as well as surgery. Pathologic stage as above. Previously patient declined adjuvant FOLFOX therapy. He expressed understanding of the increased risk of recurrence by foregoing chemotherapy. CT scan results reviewed independently and reported as above with irregular soft tissue mass in the left aspect of the pelvis. Because of this, will repeat imaging in 6 months to assess for interval change. Patient will return to clinic in 3 months for laboratory work and then in 6 months after his imaging for further evaluation.  2. Weight loss: Resolved. 3. Diarrhea: Resolved. 4. Colostomy: Continue stoma care as indicated.  Patient expressed understanding and was in agreement with this plan. He also understands that He can call clinic at any time with any questions, concerns, or complaints.    TLloyd Huger MD   11/02/2014 5:18 PM

## 2014-11-04 ENCOUNTER — Telehealth: Payer: Self-pay | Admitting: *Deleted

## 2014-11-04 NOTE — Telephone Encounter (Signed)
Called to state she was told by Dr Grayland Ormond that an order would be sent to Dr Rexene Edison to have his port removed, but he call ed and [facility] said they have not received an order to remove his port from [place].

## 2014-11-04 NOTE — Telephone Encounter (Signed)
Orders faxed to Dr. Rexene Edison.

## 2014-11-18 ENCOUNTER — Encounter: Payer: Self-pay | Admitting: Surgery

## 2014-11-18 ENCOUNTER — Ambulatory Visit (INDEPENDENT_AMBULATORY_CARE_PROVIDER_SITE_OTHER): Payer: 59 | Admitting: Surgery

## 2014-11-18 VITALS — BP 138/82 | HR 84 | Temp 98.3°F | Ht 75.0 in | Wt 285.0 lb

## 2014-11-18 DIAGNOSIS — C20 Malignant neoplasm of rectum: Secondary | ICD-10-CM | POA: Diagnosis not present

## 2014-11-18 NOTE — Patient Instructions (Signed)
You will be receiving a call from our surgery scheduler to confirm your procedure date and time.

## 2014-11-18 NOTE — H&P (Signed)
Surgery History and Physical  CC: Port a cath removal consultation HPI: [name] is a pleasant 59 yo M with a history of APR with colostomy, neoadjuvant chemoradiation who presents for port removal.  Doing well.  Has put on weight lost with surgery.  No pain.  No ostomy issues.  Does report issues with bulging around ostomy which deflects his ostomy downward.  Otherwise no fevers/chills, night sweats, shortness of breath, cough, chest pain, abdominal pain, nausea/vomiting, diarrhea/constipation, dysuria/hematuria.  Active Ambulatory Problems    Diagnosis Date Noted  . Rectal cancer ([place]) 08/05/2014   Resolved Ambulatory Problems    Diagnosis Date Noted  . No Resolved Ambulatory Problems   Past Medical History  Diagnosis Date  . Anemia   . Kidney stones   . Cancer [facility])    Past Surgical History  Procedure Laterality Date  . Cholecystectomy    . Back surgery    . Knee surgery Bilateral   . Colon surgery       Medication List       This list is accurate as of: 11/18/14 12:19 PM.  Always use your most recent med list.               diazepam 5 MG tablet  Commonly known as:  VALIUM  Take 1 tablet (5 mg total) by mouth every 8 (eight) hours as needed for anxiety.     IMODIUM A-D PO  Take by mouth as needed.       Allergies  Allergen Reactions  . Sulfa Antibiotics     Other reaction(s): Other (qualifier value)   Social History   Social History  . Marital Status: Married    Spouse Name: N/A  . Number of Children: N/A  . Years of Education: N/A   Occupational History  . Not on file.   Social History Main Topics  . Smoking status: Never Smoker   . Smokeless tobacco: Current User  . Alcohol Use: No  . Drug Use: No  . Sexual Activity: Not on file   Other Topics Concern  . Not on file   Social History Narrative   Family History  Problem Relation Age of Onset  . Cancer Mother   . Hypertension Mother   . Cancer Father    ROS: Full ROS obtained,  pertinent positives and negatives as above  Blood pressure 138/82, pulse 84, temperature 98.3 F (36.8 C), temperature source Oral, height '6\' 3"'$  (1.905 m), weight 285 lb (129.275 kg). GEN: NAD/A&Ox3 FACE: no obvious facial trauma, normal external nose, normal external ears EYES: no scleral icterus, no conjunctivitis HEAD: normocephalic atraumatic CV: RRR, no MRG CHEST: Port a cath without redness RESP: moving air well, lungs clear ABD: soft, nontender, nondistended, ostomy with peristomal hernia, no pain EXT: moving all ext well, strength 5/5 NEURO: cnII-XII grossly intact, sensation intact all 4 ext  Imaging: No new imaging Labs: No new labs  A/P 59 yo s/p APR, neoadjuvant chemoradiation who would like port removed.  I have explained the benefits and potential risks of port removal and he would like to proceed.

## 2014-11-18 NOTE — Progress Notes (Signed)
Surgery History and Physical  CC: Port a cath removal consultation HPI: [name] is a pleasant 59 yo M with a history of APR with colostomy, neoadjuvant chemoradiation who presents for port removal.  Doing well.  Has put on weight lost with surgery.  No pain.  No ostomy issues.  Does report issues with bulging around ostomy which deflects his ostomy downward.  Otherwise no fevers/chills, night sweats, shortness of breath, cough, chest pain, abdominal pain, nausea/vomiting, diarrhea/constipation, dysuria/hematuria.  Active Ambulatory Problems    Diagnosis Date Noted  . Rectal cancer ([place]) 08/05/2014   Resolved Ambulatory Problems    Diagnosis Date Noted  . No Resolved Ambulatory Problems   Past Medical History  Diagnosis Date  . Anemia   . Kidney stones   . Cancer [facility])    Past Surgical History  Procedure Laterality Date  . Cholecystectomy    . Back surgery    . Knee surgery Bilateral   . Colon surgery       Medication List       This list is accurate as of: 11/18/14 12:19 PM.  Always use your most recent med list.               diazepam 5 MG tablet  Commonly known as:  VALIUM  Take 1 tablet (5 mg total) by mouth every 8 (eight) hours as needed for anxiety.     IMODIUM A-D PO  Take by mouth as needed.       Allergies  Allergen Reactions  . Sulfa Antibiotics     Other reaction(s): Other (qualifier value)   Social History   Social History  . Marital Status: Married    Spouse Name: N/A  . Number of Children: N/A  . Years of Education: N/A   Occupational History  . Not on file.   Social History Main Topics  . Smoking status: Never Smoker   . Smokeless tobacco: Current User  . Alcohol Use: No  . Drug Use: No  . Sexual Activity: Not on file   Other Topics Concern  . Not on file   Social History Narrative   Family History  Problem Relation Age of Onset  . Cancer Mother   . Hypertension Mother   . Cancer Father    ROS: Full ROS obtained,  pertinent positives and negatives as above  Blood pressure 138/82, pulse 84, temperature 98.3 F (36.8 C), temperature source Oral, height '6\' 3"'$  (1.905 m), weight 285 lb (129.275 kg). GEN: NAD/A&Ox3 FACE: no obvious facial trauma, normal external nose, normal external ears EYES: no scleral icterus, no conjunctivitis HEAD: normocephalic atraumatic CV: RRR, no MRG CHEST: Port a cath without redness RESP: moving air well, lungs clear ABD: soft, nontender, nondistended, ostomy with peristomal hernia, no pain EXT: moving all ext well, strength 5/5 NEURO: cnII-XII grossly intact, sensation intact all 4 ext  Imaging: No new imaging Labs: No new labs  A/P 59 yo s/p APR, neoadjuvant chemoradiation who would like port removed.  I have explained the benefits and potential risks of port removal and he would like to proceed.

## 2014-11-20 ENCOUNTER — Telehealth: Payer: Self-pay | Admitting: Surgery

## 2014-11-20 NOTE — Telephone Encounter (Signed)
Pt advised of pre op date/time and sx date. Sx: 12/02/14 with Dr Roland Earl removal Pre op: 11/24/14 between 1-5:00pm--Phone.

## 2014-11-24 ENCOUNTER — Encounter: Payer: Self-pay | Admitting: *Deleted

## 2014-11-24 ENCOUNTER — Inpatient Hospital Stay: Admission: RE | Admit: 2014-11-24 | Payer: 59 | Source: Ambulatory Visit

## 2014-11-24 NOTE — Patient Instructions (Signed)
  Your procedure is scheduled on: 12-02-14 Report to [facility] To find out your arrival time please call [phone] between 1PM - 3PM on 12-01-14  Remember: Instructions that are not followed completely may result in serious medical risk, up to and including death, or upon the discretion of your surgeon and anesthesiologist your surgery may need to be rescheduled.    _X___ 1. Do not eat food or drink liquids after midnight. No gum chewing or hard candies.     _X___ 2. No Alcohol for 24 hours before or after surgery.   ____ 3. Bring all medications with you on the day of surgery if instructed.    ____ 4. Notify your doctor if there is any change in your medical condition     (cold, fever, infections).     Do not wear jewelry, make-up, hairpins, clips or nail polish.  Do not wear lotions, powders, or perfumes. You may wear deodorant.  Do not shave 48 hours prior to surgery. Men may shave face and neck.  Do not bring valuables to the hospital.    [facility] is not responsible for any belongings or valuables.               Contacts, dentures or bridgework may not be worn into surgery.  Leave your suitcase in the car. After surgery it may be brought to your room.  For patients admitted to the hospital, discharge time is determined by your  treatment team.   Patients discharged the day of surgery will not be allowed to drive home.   Please read over the following fact sheets that you were given:    ____ Take these medicines the morning of surgery with A SIP OF WATER:    1. MAY TAKE VALIUM IF NEEDED              2.   3.   4.  5.  6.  ____ Fleet Enema (as directed)   ____ Use CHG Soap as directed  ____ Use inhalers on the day of surgery  ____ Stop metformin 2 days prior to surgery    ____ Take 1/2 of usual insulin dose the night before surgery and none on the morning of surgery.   ____ Stop Coumadin/Plavix/aspirin-N/A  ____ Stop  Anti-inflammatories-NO NSAIDS OR ASA PRODUCTS 7 DAYS PRIOR-TYLENOL OK   ____ Stop supplements until after surgery.    ____ Bring C-Pap to the hospital.

## 2014-12-02 ENCOUNTER — Encounter
Admission: RE | Disposition: A | Discharge status: Home / Self Care | Payer: Self-pay | Source: Ambulatory Visit | Attending: Surgery

## 2014-12-02 ENCOUNTER — Ambulatory Visit
Admission: RE | Admit: 2014-12-02 | Discharge: 2014-12-02 | Disposition: A | Discharge status: Home / Self Care | Payer: 59 | Source: Ambulatory Visit | Attending: Surgery | Admitting: Surgery

## 2014-12-02 ENCOUNTER — Ambulatory Visit: Payer: 59 | Admitting: Anesthesiology

## 2014-12-02 ENCOUNTER — Encounter: Payer: Self-pay | Admitting: *Deleted

## 2014-12-02 DIAGNOSIS — Z87442 Personal history of urinary calculi: Secondary | ICD-10-CM | POA: Insufficient documentation

## 2014-12-02 DIAGNOSIS — Z809 Family history of malignant neoplasm, unspecified: Secondary | ICD-10-CM | POA: Diagnosis not present

## 2014-12-02 DIAGNOSIS — Z6835 Body mass index (BMI) 35.0-35.9, adult: Secondary | ICD-10-CM | POA: Insufficient documentation

## 2014-12-02 DIAGNOSIS — Z9889 Other specified postprocedural states: Secondary | ICD-10-CM | POA: Diagnosis not present

## 2014-12-02 DIAGNOSIS — Z79899 Other long term (current) drug therapy: Secondary | ICD-10-CM | POA: Diagnosis not present

## 2014-12-02 DIAGNOSIS — Z8249 Family history of ischemic heart disease and other diseases of the circulatory system: Secondary | ICD-10-CM | POA: Insufficient documentation

## 2014-12-02 DIAGNOSIS — Z9049 Acquired absence of other specified parts of digestive tract: Secondary | ICD-10-CM | POA: Diagnosis not present

## 2014-12-02 DIAGNOSIS — Z452 Encounter for adjustment and management of vascular access device: Secondary | ICD-10-CM | POA: Insufficient documentation

## 2014-12-02 DIAGNOSIS — Z882 Allergy status to sulfonamides status: Secondary | ICD-10-CM | POA: Insufficient documentation

## 2014-12-02 DIAGNOSIS — C20 Malignant neoplasm of rectum: Secondary | ICD-10-CM | POA: Diagnosis not present

## 2014-12-02 DIAGNOSIS — Z933 Colostomy status: Secondary | ICD-10-CM | POA: Diagnosis not present

## 2014-12-02 HISTORY — PX: PORT-A-CATH REMOVAL: SHX5289

## 2014-12-02 HISTORY — DX: Colostomy status: Z93.3

## 2014-12-02 HISTORY — DX: Other seasonal allergic rhinitis: J30.2

## 2014-12-02 SURGERY — REMOVAL PORT-A-CATH
Anesthesia: Monitor Anesthesia Care | Laterality: Right | Wound class: Clean

## 2014-12-02 MED ORDER — BUPIVACAINE HCL (PF) 0.25 % IJ SOLN
INTRAMUSCULAR | Status: DC | PRN
  Administered 2014-12-02: 4 mL

## 2014-12-02 MED ORDER — FENTANYL CITRATE (PF) 100 MCG/2ML IJ SOLN: 25.0000 ug | INTRAMUSCULAR | Status: DC | PRN

## 2014-12-02 MED ORDER — DIAZEPAM 5 MG PO TABS: 5.0000 mg | ORAL_TABLET | Freq: Three times a day (TID) | ORAL | Status: DC | PRN

## 2014-12-02 MED ORDER — LIDOCAINE HCL (PF) 1 % IJ SOLN
INTRAMUSCULAR | Status: DC | PRN
  Administered 2014-12-02: 4 mL

## 2014-12-02 MED ORDER — OXYCODONE-ACETAMINOPHEN 5-325 MG PO TABS: 1.0000 | ORAL_TABLET | Freq: Three times a day (TID) | ORAL | Status: DC | PRN

## 2014-12-02 MED ORDER — CEFAZOLIN SODIUM-DEXTROSE 2-3 GM-% IV SOLR
2.0000 g | INTRAVENOUS | Status: AC
  Administered 2014-12-02: 2 g via INTRAVENOUS

## 2014-12-02 MED ORDER — FAMOTIDINE 20 MG PO TABS
ORAL_TABLET | ORAL | Status: AC
  Administered 2014-12-02: 20 mg via ORAL
  Filled 2014-12-02: qty 1

## 2014-12-02 MED ORDER — FAMOTIDINE 20 MG PO TABS
20.0000 mg | ORAL_TABLET | Freq: Once | ORAL | Status: AC
  Administered 2014-12-02: 20 mg via ORAL

## 2014-12-02 MED ORDER — CEFAZOLIN SODIUM-DEXTROSE 2-3 GM-% IV SOLR
INTRAVENOUS | Status: AC
  Administered 2014-12-02: 2 g via INTRAVENOUS
  Filled 2014-12-02: qty 50

## 2014-12-02 MED ORDER — MIDAZOLAM HCL 5 MG/5ML IJ SOLN
INTRAMUSCULAR | Status: DC | PRN
  Administered 2014-12-02: 2 mg via INTRAVENOUS

## 2014-12-02 MED ORDER — FENTANYL CITRATE (PF) 100 MCG/2ML IJ SOLN
INTRAMUSCULAR | Status: DC | PRN
  Administered 2014-12-02 (×2): 50 ug via INTRAVENOUS

## 2014-12-02 MED ORDER — PROPOFOL 500 MG/50ML IV EMUL
INTRAVENOUS | Status: DC | PRN
  Administered 2014-12-02: 140 ug/kg/min via INTRAVENOUS

## 2014-12-02 MED ORDER — PROMETHAZINE HCL 25 MG/ML IJ SOLN: 6.2500 mg | INTRAMUSCULAR | Status: DC | PRN

## 2014-12-02 MED ORDER — LIDOCAINE HCL (PF) 1 % IJ SOLN
INTRAMUSCULAR | Status: AC
  Filled 2014-12-02: qty 30

## 2014-12-02 MED ORDER — LACTATED RINGERS IV SOLN
INTRAVENOUS | Status: DC
  Administered 2014-12-02 (×2): via INTRAVENOUS

## 2014-12-02 MED ORDER — BUPIVACAINE HCL (PF) 0.25 % IJ SOLN
INTRAMUSCULAR | Status: AC
  Filled 2014-12-02: qty 30

## 2014-12-02 MED ORDER — LIDOCAINE HCL (CARDIAC) 20 MG/ML IV SOLN
INTRAVENOUS | Status: DC | PRN
  Administered 2014-12-02: 30 mg via INTRAVENOUS

## 2014-12-02 SURGICAL SUPPLY — 24 items
BLADE CLIPPER SURG (BLADE) ×3 IMPLANT
BLADE SURG SZ11 CARB STEEL (BLADE) ×3 IMPLANT
CANISTER SUCT 1200ML W/VALVE (MISCELLANEOUS) ×3 IMPLANT
CHLORAPREP W/TINT 26ML (MISCELLANEOUS) ×3 IMPLANT
COVER LIGHT HANDLE STERIS (MISCELLANEOUS) ×6 IMPLANT
DRESSING TELFA 4X3 1S ST N-ADH (GAUZE/BANDAGES/DRESSINGS) ×3 IMPLANT
DRSG TEGADERM 4X4.75 (GAUZE/BANDAGES/DRESSINGS) ×3 IMPLANT
GLOVE BIO SURGEON STRL SZ7.5 (GLOVE) ×9 IMPLANT
GOWN STRL REUS W/ TWL LRG LVL3 (GOWN DISPOSABLE) ×2 IMPLANT
GOWN STRL REUS W/TWL LRG LVL3 (GOWN DISPOSABLE) ×4
KIT RM TURNOVER STRD PROC AR (KITS) ×3 IMPLANT
LABEL OR SOLS (LABEL) ×3 IMPLANT
LIQUID BAND (GAUZE/BANDAGES/DRESSINGS) ×3 IMPLANT
PACK PORT-A-CATH (MISCELLANEOUS) ×3 IMPLANT
PAD GROUND ADULT SPLIT (MISCELLANEOUS) ×3 IMPLANT
SUT ETHILON 3-0 FS-10 30 BLK (SUTURE) ×3
SUT MNCRL 4-0 (SUTURE) ×2
SUT MNCRL 4-0 27XMFL (SUTURE) ×1
SUT PROLENE 3 0 SH DA (SUTURE) IMPLANT
SUT VIC AB 3-0 SH 27 (SUTURE) ×4
SUT VIC AB 3-0 SH 27X BRD (SUTURE) ×2 IMPLANT
SUTURE EHLN 3-0 FS-10 30 BLK (SUTURE) ×1 IMPLANT
SUTURE MNCRL 4-0 27XMF (SUTURE) ×1 IMPLANT
SYRINGE 10CC LL (SYRINGE) IMPLANT

## 2014-12-02 NOTE — Progress Notes (Signed)
Pr requested new rx for percocet and valium. Dr Rexene Edison paged, in to see and gave pt rx for both prior to d/c to home

## 2014-12-02 NOTE — Anesthesia Preprocedure Evaluation (Addendum)
Anesthesia Evaluation  Patient identified by MRN, date of birth, ID band Patient awake    Reviewed: Allergy & Precautions, H&P , NPO status , Patient's Chart, lab work & pertinent test results, reviewed documented beta blocker date and time   History of Anesthesia Complications Negative for: history of anesthetic complications  Airway Mallampati: I  TM Distance: >3 FB Neck ROM: full    Dental no notable dental hx. (+) Caps, Teeth Intact, Missing   Pulmonary neg pulmonary ROS,    Pulmonary exam normal breath sounds clear to auscultation       Cardiovascular Exercise Tolerance: Good negative cardio ROS Normal cardiovascular exam Rhythm:regular Rate:Normal     Neuro/Psych negative neurological ROS  negative psych ROS   GI/Hepatic negative GI ROS, Neg liver ROS,   Endo/Other  neg diabetesMorbid obesity  Renal/GU Renal disease (kidney stones)  negative genitourinary   Musculoskeletal   Abdominal   Peds  Hematology  (+) Blood dyscrasia, anemia ,   Anesthesia Other Findings Past Medical History:   Anemia                                                       Kidney stones                                                Cancer ([place])                                                   Comment:rectal 2015   Colostomy in place ([place])                                     Seasonal allergies                                           Reproductive/Obstetrics negative OB ROS                             Anesthesia Physical Anesthesia Plan  ASA: III  Anesthesia Plan: General   Post-op Pain Management:    Induction:   Airway Management Planned:   Additional Equipment:   Intra-op Plan:   Post-operative Plan:   Informed Consent: I have reviewed the patients History and Physical, chart, labs and discussed the procedure including the risks, benefits and alternatives for the proposed anesthesia with  the patient or authorized representative who has indicated his/her understanding and acceptance.   Dental Advisory Given  Plan Discussed with: Anesthesiologist, CRNA and Surgeon  Anesthesia Plan Comments:       Anesthesia Quick Evaluation

## 2014-12-02 NOTE — Progress Notes (Signed)
Surgery Progress Note  I have seen and evaluated [name].  No acute changes.  Proceed with surgery.

## 2014-12-02 NOTE — Brief Op Note (Signed)
12/02/2014  10:41 AM  PATIENT:  [name] [name]  59 y.o. male  PRE-OPERATIVE DIAGNOSIS:  Rectal cancer s/p port a cath  POST-OPERATIVE DIAGNOSIS:  Rectal cancer s/p port a cath  PROCEDURE:  Procedure(s): REMOVAL PORT-A-CATH (Right)  SURGEON:  Surgeon(s) and Role:    * Marlyce Huge, MD - Primary  PHYSICIAN ASSISTANT:   ASSISTANTS: none   ANESTHESIA:   IV sedation  EBL:  Total I/O In: 600 [I.V.:600] Out: -   BLOOD ADMINISTERED:none  DRAINS: none   LOCAL MEDICATIONS USED:  LIDOCAINE   SPECIMEN:  No Specimen  DISPOSITION OF SPECIMEN:  N/A  COUNTS:  YES  TOURNIQUET:  * No tourniquets in log *  DICTATION: .Note written in EPIC  PLAN OF CARE: Discharge to home after PACU  PATIENT DISPOSITION:  PACU - hemodynamically stable.   Delay start of Pharmacological VTE agent (>24hrs) due to surgical blood loss or risk of bleeding: not applicable

## 2014-12-02 NOTE — Transfer of Care (Signed)
Immediate Anesthesia Transfer of Care Note  Patient: [name] [name]  Procedure(s) Performed: Procedure(s): REMOVAL PORT-A-CATH (Right)  Patient Location: PACU and Short Stay  Anesthesia Type:General  Level of Consciousness: awake, oriented and patient cooperative  Airway & Oxygen Therapy: Patient Spontanous Breathing and Patient connected to face mask oxygen  Post-op Assessment: Report given to RN and Post -op Vital signs reviewed and stable  Post vital signs: Reviewed and stable  Last Vitals:  Filed Vitals:   12/02/14 1041  BP: 105/69  Pulse: 65  Temp: 37.4 C  Resp: 10    Complications: No apparent anesthesia complications

## 2014-12-02 NOTE — Discharge Instructions (Signed)
Do not drive on pain medications Call or return to ER if you develop fever greater than 101.5, nausea/vomiting, increased pain, redness/drainage from incisions. Take bandages off in 48 hours.  Okay to shower with bandages on or after they come off, no tub baths       General Anesthesia, Adult, Care After Refer to this sheet in the next few weeks. These instructions provide you with information on caring for yourself after your procedure. Your health care provider may also give you more specific instructions. Your treatment has been planned according to current medical practices, but problems sometimes occur. Call your health care provider if you have any problems or questions after your procedure. WHAT TO EXPECT AFTER THE PROCEDURE After the procedure, it is typical to experience:  Sleepiness.  Nausea and vomiting. HOME CARE INSTRUCTIONS  For the first 24 hours after general anesthesia:  Have a responsible person with you.  Do not drive a car. If you are alone, do not take public transportation.  Do not drink alcohol.  Do not take medicine that has not been prescribed by your health care provider.  Do not sign important papers or make important decisions.  You may resume a normal diet and activities as directed by your health care provider.  Change bandages (dressings) as directed.  If you have questions or problems that seem related to general anesthesia, call the hospital and ask for the anesthetist or anesthesiologist on call. SEEK MEDICAL CARE IF:  You have nausea and vomiting that continue the day after anesthesia.  You develop a rash. SEEK IMMEDIATE MEDICAL CARE IF:   You have difficulty breathing.  You have chest pain.  You have any allergic problems.   This information is not intended to replace advice given to you by your health care provider. Make sure you discuss any questions you have with your health care provider.   Document Released: 04/17/2000  Document Revised: 01/30/2014 Document Reviewed: 05/10/2011 Elsevier Interactive Patient Education [employer].

## 2014-12-02 NOTE — Op Note (Signed)
Preoperative diagnosis: rectal cancer s/p APR s/p neoadjuvant chemoradiation Postoperative diagnosis: Same Procedure Performed: Port a cath removal Anesthesia: IV sedation and local EBL: 5 ml Specimen: None Complication: none  Indication for surgery: Mr. Seamans is a pleasant 59 yo M who required port a cath placement for neoadjuvant chemoradiation for rectal cancer.  He is no longer requiring port a cath and thus we have elected for port a cath removal  Details of surgery: Informed consent was obtained.  Patient was brought to the OR suite and placed supine on the OR table.  He was given IV sedation and his right chest was prepped and draped.  A time out was performed correctly identifying patient name, operative site and procedure to be performed.  His prior incision was infiltrated with 1% lidocaine with epinephrine.  The skin was then incised and deepened to the port.  The port was dissected out.  Its stay sutures were clipped and a 3-0 vicryl figure of 8 was placed at the site where the catheter became subcutaneous.  The port was removed with a single fluid motion and the suture was tied.  The capsule was then excised with cautery.  The wound was made hemostatic.  It was then closed in layers, a deeper running 3-0 vicryl deep dermal layer and an interrupted 3-0 nylon vertical mattress layer.  The wound was then dressed with a sterile gauze and tegaderm.  The drapes were then taken down and the patient was awoken and brought to PACU.  There were no immediate complications.  Needle, sponge and instrument count was correct at the end of the procedure.

## 2014-12-03 ENCOUNTER — Telehealth: Payer: Self-pay

## 2014-12-03 NOTE — Telephone Encounter (Signed)
Post-surgical call made to patient at this time. Patient states that he is feeling great! No problems or concerns at this time. Verified appointment date and time with patient. He confirms that he will be here for appointment. Will call if he develops any questions prior to appointment.

## 2014-12-03 NOTE — Anesthesia Postprocedure Evaluation (Signed)
  Anesthesia Post-op Note  Patient: [name] [name]  Procedure(s) Performed: Procedure(s): REMOVAL PORT-A-CATH (Right)  Anesthesia type:MAC  Patient location: PACU  Post pain: Pain level controlled  Post assessment: Post-op Vital signs reviewed, Patient's Cardiovascular Status Stable, Respiratory Function Stable, Patent Airway and No signs of Nausea or vomiting  Post vital signs: Reviewed and stable  Last Vitals:  Filed Vitals:   12/02/14 1153  BP: 131/69  Pulse: 54  Temp:   Resp: 16    Level of consciousness: awake, alert  and patient cooperative  Complications: No apparent anesthesia complications

## 2014-12-16 ENCOUNTER — Ambulatory Visit (INDEPENDENT_AMBULATORY_CARE_PROVIDER_SITE_OTHER): Payer: 59 | Admitting: Surgery

## 2014-12-16 ENCOUNTER — Encounter: Payer: Self-pay | Admitting: Surgery

## 2014-12-16 VITALS — BP 162/88 | HR 76 | Temp 97.9°F | Ht 75.0 in | Wt 260.0 lb

## 2014-12-16 DIAGNOSIS — C20 Malignant neoplasm of rectum: Secondary | ICD-10-CM

## 2014-12-16 NOTE — Patient Instructions (Signed)
Please call our office with any questions or concerns. 

## 2014-12-16 NOTE — Progress Notes (Signed)
Outpatient postop visit  12/16/2014  [name] [name] is an 59 y.o. male.    Procedure: Port Removal  CC: No problems  HPI: Patient with rectal cancer who had his port removal done this week.  Medications reviewed.    Physical Exam:  There were no vitals taken for this visit.    PE: Wound healing no erythema sutures are removed by RN.    Assessment/Plan:  Doing well follow-up on an as-needed basis  Florene Glen, MD, FACS

## 2015-01-06 ENCOUNTER — Encounter: Payer: Self-pay | Admitting: Family Medicine

## 2015-01-06 ENCOUNTER — Ambulatory Visit (INDEPENDENT_AMBULATORY_CARE_PROVIDER_SITE_OTHER): Payer: 59 | Admitting: Family Medicine

## 2015-01-06 VITALS — BP 138/86 | HR 121 | Temp 99.4°F | Resp 20 | Ht 75.0 in | Wt 285.1 lb

## 2015-01-06 DIAGNOSIS — Z933 Colostomy status: Secondary | ICD-10-CM

## 2015-01-06 DIAGNOSIS — C20 Malignant neoplasm of rectum: Secondary | ICD-10-CM

## 2015-01-06 DIAGNOSIS — Z0000 Encounter for general adult medical examination without abnormal findings: Secondary | ICD-10-CM

## 2015-01-06 NOTE — Progress Notes (Signed)
Name: [name] [name]   MRN: [id number]    DOB: 12-Aug-1955   Date:01/06/2015       Progress Note  Subjective  Chief Complaint  Chief Complaint  Patient presents with  . Annual Exam    HPI   59 year old presenting for annual H&P.    he has a history of rectal cancer and has a colostomy in place and this is been stable followed by his oncologist in general surgeon.  Past Medical History  Diagnosis Date  . Anemia   . Kidney stones   . Cancer ([place])     rectal 2015  . Colostomy in place [facility])   . Seasonal allergies     Social History  Substance Use Topics  . Smoking status: Never Smoker   . Smokeless tobacco: Current User    Types: Snuff  . Alcohol Use: 0.0 oz/week    0 Standard drinks or equivalent per week     Comment: RARE     Current outpatient prescriptions:  .  cetirizine (ZYRTEC) 10 MG tablet, Take 10 mg by mouth daily., Disp: , Rfl:  .  diazepam (VALIUM) 5 MG tablet, Take 1 tablet (5 mg total) by mouth every 8 (eight) hours as needed for anxiety., Disp: 30 tablet, Rfl: 0 .  diazepam (VALIUM) 5 MG tablet, Take 1 tablet (5 mg total) by mouth every 8 (eight) hours as needed for anxiety., Disp: 40 tablet, Rfl: 1 .  Loperamide HCl (IMODIUM A-D PO), Take by mouth as needed., Disp: , Rfl:  .  oxyCODONE-acetaminophen (PERCOCET/ROXICET) 5-325 MG tablet, Take 1 tablet by mouth every 8 (eight) hours as needed for severe pain., Disp: 15 tablet, Rfl: 0 .  oxyCODONE-acetaminophen (PERCOCET/ROXICET) 5-325 MG tablet, Take 1 tablet by mouth every 8 (eight) hours as needed for severe pain., Disp: , Rfl:  .  sodium chloride (OCEAN) 0.65 % SOLN nasal spray, Place 1 spray into both nostrils as needed for congestion., Disp: , Rfl:  .  timolol (BETIMOL) 0.5 % ophthalmic solution, Place 1 drop into the right eye daily., Disp: , Rfl:   Allergies  Allergen Reactions  . Sulfa Antibiotics Hives    Other reaction(s): Other (qualifier value)    Review of Systems  Constitutional: Negative  for fever, chills and weight loss.  HENT: Negative for congestion, hearing loss, sore throat and tinnitus.   Eyes: Negative for blurred vision, double vision and redness.  Respiratory: Negative for cough, hemoptysis and shortness of breath.   Cardiovascular: Negative for chest pain, palpitations, orthopnea, claudication and leg swelling.  Gastrointestinal: Negative for heartburn, nausea, vomiting, diarrhea, constipation and blood in stool.  Genitourinary: Negative for dysuria, urgency, frequency and hematuria.  Musculoskeletal: Negative for myalgias, back pain, joint pain, falls and neck pain.  Skin: Negative for itching.  Neurological: Negative for dizziness, tingling, tremors, focal weakness, seizures, loss of consciousness, weakness and headaches.  Endo/Heme/Allergies: Does not bruise/bleed easily.  Psychiatric/Behavioral: Negative for depression and substance abuse. The patient is not nervous/anxious and does not have insomnia.      Objective  Filed Vitals:   01/06/15 1054  BP: 138/86  Pulse: 121  Temp: 99.4 F (37.4 C)  Resp: 20  Height: '6\' 3"'$  (1.905 m)  Weight: 285 lb 2 oz (129.332 kg)  SpO2: 96%     Physical Exam  Constitutional: He is oriented to person, place, and time and well-developed, well-nourished, and in no distress.  HENT:  Head: Normocephalic.  Eyes: EOM are normal. Pupils are equal, round, and  reactive to light.  Neck: Normal range of motion. Neck supple. No thyromegaly present.  Cardiovascular: Normal rate, regular rhythm and normal heart sounds.   No murmur heard. Pulmonary/Chest: Effort normal and breath sounds normal. No respiratory distress. He has no wheezes.  Abdominal: Soft. Bowel sounds are normal.  Musculoskeletal: Normal range of motion. He exhibits no edema.  Lymphadenopathy:    He has no cervical adenopathy.  Neurological: He is alert and oriented to person, place, and time. No cranial nerve deficit. Gait normal. Coordination normal.  Skin:  Skin is warm and dry. No rash noted.  Psychiatric: Affect and judgment normal.      Assessment & Plan   1. Annual physical exam  - CBC - Comprehensive Metabolic Panel (CMET) - Lipid panel - PSA - TSH  2. Rectal cancer [facility]) Followed by oncologist and general surgeon    3. Colostomy present ([place]) As above

## 2015-01-11 ENCOUNTER — Telehealth: Payer: Self-pay | Admitting: Family Medicine

## 2015-01-11 MED ORDER — AZITHROMYCIN 250 MG PO TABS: ORAL_TABLET | ORAL | Status: DC

## 2015-01-11 NOTE — Telephone Encounter (Signed)
Z pak has been sent to pharmacy that is on file,please inform pt.

## 2015-01-11 NOTE — Telephone Encounter (Signed)
z-pak 

## 2015-01-11 NOTE — Telephone Encounter (Signed)
Pt was seen last wk and said dr told him if congestion and sinus are not better for him to call and he would call him something in. Pharm is [facility].

## 2015-01-12 NOTE — Telephone Encounter (Signed)
MESS TO PT NOTIFIED ABOUT RX SENT

## 2015-01-26 ENCOUNTER — Inpatient Hospital Stay: Payer: 59 | Attending: Oncology

## 2015-01-26 DIAGNOSIS — Z933 Colostomy status: Secondary | ICD-10-CM | POA: Diagnosis not present

## 2015-01-26 DIAGNOSIS — Z9221 Personal history of antineoplastic chemotherapy: Secondary | ICD-10-CM | POA: Insufficient documentation

## 2015-01-26 DIAGNOSIS — C20 Malignant neoplasm of rectum: Secondary | ICD-10-CM | POA: Insufficient documentation

## 2015-01-26 DIAGNOSIS — Z923 Personal history of irradiation: Secondary | ICD-10-CM | POA: Diagnosis not present

## 2015-01-26 LAB — CBC WITH DIFFERENTIAL/PLATELET
BASOS ABS: 0 10*3/uL (ref 0–0.1)
BASOS PCT: 1 %
EOS ABS: 0.2 10*3/uL (ref 0–0.7)
Eosinophils Relative: 6 %
HEMATOCRIT: 41.3 % (ref 40.0–52.0)
HEMOGLOBIN: 13.9 g/dL (ref 13.0–18.0)
Lymphocytes Relative: 18 %
Lymphs Abs: 0.6 10*3/uL — ABNORMAL LOW (ref 1.0–3.6)
MCH: 28.9 pg (ref 26.0–34.0)
MCHC: 33.6 g/dL (ref 32.0–36.0)
MCV: 86.1 fL (ref 80.0–100.0)
MONOS PCT: 10 %
Monocytes Absolute: 0.3 10*3/uL (ref 0.2–1.0)
NEUTROS ABS: 2.2 10*3/uL (ref 1.4–6.5)
NEUTROS PCT: 65 %
Platelets: 414 10*3/uL (ref 150–440)
RBC: 4.8 MIL/uL (ref 4.40–5.90)
RDW: 14.4 % (ref 11.5–14.5)
WBC: 3.4 10*3/uL — AB (ref 3.8–10.6)

## 2015-01-27 LAB — CEA: CEA: 1.4 ng/mL (ref 0.0–4.7)

## 2015-02-04 DIAGNOSIS — C785 Secondary malignant neoplasm of large intestine and rectum: Secondary | ICD-10-CM | POA: Diagnosis not present

## 2015-02-10 DIAGNOSIS — H5213 Myopia, bilateral: Secondary | ICD-10-CM | POA: Diagnosis not present

## 2015-03-08 DIAGNOSIS — C785 Secondary malignant neoplasm of large intestine and rectum: Secondary | ICD-10-CM | POA: Diagnosis not present

## 2015-03-22 ENCOUNTER — Other Ambulatory Visit: Payer: Self-pay | Admitting: *Deleted

## 2015-03-22 MED ORDER — OXYCODONE-ACETAMINOPHEN 5-325 MG PO TABS: 1.0000 | ORAL_TABLET | Freq: Three times a day (TID) | ORAL | Status: DC | PRN

## 2015-03-22 MED ORDER — DIAZEPAM 5 MG PO TABS: 5.0000 mg | ORAL_TABLET | Freq: Three times a day (TID) | ORAL | Status: DC | PRN

## 2015-04-07 DIAGNOSIS — C785 Secondary malignant neoplasm of large intestine and rectum: Secondary | ICD-10-CM | POA: Diagnosis not present

## 2015-04-26 ENCOUNTER — Ambulatory Visit
Admission: RE | Admit: 2015-04-26 | Discharge: 2015-04-26 | Disposition: A | Discharge status: Home / Self Care | Payer: 59 | Source: Ambulatory Visit | Attending: Oncology | Admitting: Oncology

## 2015-04-26 DIAGNOSIS — Z933 Colostomy status: Secondary | ICD-10-CM | POA: Diagnosis not present

## 2015-04-26 DIAGNOSIS — K5730 Diverticulosis of large intestine without perforation or abscess without bleeding: Secondary | ICD-10-CM | POA: Insufficient documentation

## 2015-04-26 DIAGNOSIS — C20 Malignant neoplasm of rectum: Secondary | ICD-10-CM | POA: Diagnosis not present

## 2015-04-26 DIAGNOSIS — R1909 Other intra-abdominal and pelvic swelling, mass and lump: Secondary | ICD-10-CM | POA: Diagnosis not present

## 2015-04-26 MED ORDER — IOPAMIDOL (ISOVUE-370) INJECTION 76%
100.0000 mL | Freq: Once | INTRAVENOUS | Status: AC | PRN
  Administered 2015-04-26: 100 mL via INTRAVENOUS

## 2015-04-29 ENCOUNTER — Inpatient Hospital Stay: Payer: 59 | Attending: Oncology

## 2015-04-29 ENCOUNTER — Inpatient Hospital Stay (HOSPITAL_BASED_OUTPATIENT_CLINIC_OR_DEPARTMENT_OTHER): Payer: 59 | Admitting: Oncology

## 2015-04-29 VITALS — BP 137/79 | HR 78 | Temp 98.7°F | Resp 20 | Wt 276.0 lb

## 2015-04-29 DIAGNOSIS — C20 Malignant neoplasm of rectum: Secondary | ICD-10-CM | POA: Diagnosis not present

## 2015-04-29 DIAGNOSIS — Z9221 Personal history of antineoplastic chemotherapy: Secondary | ICD-10-CM | POA: Insufficient documentation

## 2015-04-29 DIAGNOSIS — Z933 Colostomy status: Secondary | ICD-10-CM | POA: Diagnosis not present

## 2015-04-29 DIAGNOSIS — Z923 Personal history of irradiation: Secondary | ICD-10-CM | POA: Insufficient documentation

## 2015-04-29 LAB — CBC WITH DIFFERENTIAL/PLATELET
Basophils Absolute: 0 10*3/uL (ref 0–0.1)
Basophils Relative: 0 %
EOS PCT: 4 %
Eosinophils Absolute: 0.1 10*3/uL (ref 0–0.7)
HEMATOCRIT: 41.4 % (ref 40.0–52.0)
Hemoglobin: 14.3 g/dL (ref 13.0–18.0)
LYMPHS ABS: 0.6 10*3/uL — AB (ref 1.0–3.6)
LYMPHS PCT: 16 %
MCH: 29.8 pg (ref 26.0–34.0)
MCHC: 34.6 g/dL (ref 32.0–36.0)
MCV: 86 fL (ref 80.0–100.0)
MONO ABS: 0.3 10*3/uL (ref 0.2–1.0)
Monocytes Relative: 8 %
NEUTROS ABS: 2.8 10*3/uL (ref 1.4–6.5)
Neutrophils Relative %: 72 %
PLATELETS: 370 10*3/uL (ref 150–440)
RBC: 4.81 MIL/uL (ref 4.40–5.90)
RDW: 14.9 % — AB (ref 11.5–14.5)
WBC: 3.9 10*3/uL (ref 3.8–10.6)

## 2015-04-29 NOTE — Progress Notes (Signed)
Patient her to discuss test results.

## 2015-04-30 LAB — CEA: CEA: 0.8 ng/mL (ref 0.0–4.7)

## 2015-05-06 DIAGNOSIS — C785 Secondary malignant neoplasm of large intestine and rectum: Secondary | ICD-10-CM | POA: Diagnosis not present

## 2015-05-06 NOTE — Progress Notes (Signed)
[facility]  Telephone:([phone]) [phone] Fax:(336) [phone]  ID: [name] [name] OB: 03/09/1955  MR#: [id number]  OEU#:[id number]  Patient Care Team: Ashok Norris, MD as PCP - General (Family Medicine) Marlyce Huge, MD as Attending Physician (Surgery)  CHIEF COMPLAINT:  Chief Complaint  Patient presents with  . Rectal Cancer    INTERVAL HISTORY: Patient returns to clinic today for laboratory work and routine 6 month evaluation. He continues to feel well and is asymptomatic. He is having no problems with his colostomy. He denies any recent fevers.  He has no neurologic complaints.  He denies any chest pain or shortness of breath.  He denies nausea, vomiting, constipation, or diarrhea. He has noted no blood or melena in his colostomy bag.  He has no urinary complaints.  Patient offers no specific complaints today.  REVIEW OF SYSTEMS:   Review of Systems  Constitutional: Negative.  Negative for fever, weight loss and malaise/fatigue.  Respiratory: Negative.  Negative for shortness of breath.   Cardiovascular: Negative.  Negative for chest pain.  Gastrointestinal: Negative.  Negative for diarrhea, constipation, blood in stool and melena.  Genitourinary: Negative.   Neurological: Negative.  Negative for weakness.  Psychiatric/Behavioral: Negative.     As per HPI. Otherwise, a complete review of systems is negatve.  PAST MEDICAL HISTORY: Past Medical History  Diagnosis Date  . Anemia   . Kidney stones   . Cancer ([place])     rectal 2015  . Colostomy in place [facility])   . Seasonal allergies     PAST SURGICAL HISTORY: Past Surgical History  Procedure Laterality Date  . Cholecystectomy    . Back surgery    . Knee surgery Bilateral   . Colon surgery    . Portacath placement    . Port-a-cath removal Right 12/02/2014    Procedure: REMOVAL PORT-A-CATH;  Surgeon: Marlyce Huge, MD;  Location: [facility] ORS;  Service: General;  Laterality: Right;    FAMILY  HISTORY: Father with prostate cancer.  COPD, CHF.     ADVANCED DIRECTIVES:    HEALTH MAINTENANCE: Social History  Substance Use Topics  . Smoking status: Never Smoker   . Smokeless tobacco: Current User    Types: Snuff  . Alcohol Use: 0.0 oz/week    0 Standard drinks or equivalent per week     Comment: RARE     Colonoscopy:  PAP:  Bone density:  Lipid panel:  Allergies  Allergen Reactions  . Sulfa Antibiotics Hives    Other reaction(s): Other (qualifier value)    Current Outpatient Prescriptions  Medication Sig Dispense Refill  . cetirizine (ZYRTEC) 10 MG tablet Take 10 mg by mouth daily.    . diazepam (VALIUM) 5 MG tablet Take 1 tablet (5 mg total) by mouth every 8 (eight) hours as needed for anxiety. 40 tablet 1  . Loperamide HCl (IMODIUM A-D PO) Take by mouth as needed.    [name] oxyCODONE-acetaminophen (PERCOCET/ROXICET) 5-325 MG tablet Take 1 tablet by mouth every 8 (eight) hours as needed for severe pain. 15 tablet 0  . sodium chloride (OCEAN) 0.65 % SOLN nasal spray Place 1 spray into both nostrils as needed for congestion.    . timolol (BETIMOL) 0.5 % ophthalmic solution Place 1 drop into the right eye daily.     No current facility-administered medications for this visit.    OBJECTIVE: Filed Vitals:   04/29/15 1106  BP: 137/79  Pulse: 78  Temp: 98.7 F (37.1 C)  Resp: 20  Body mass index is 34.5 kg/(m^2).    ECOG FS:0 - Asymptomatic  General: Well-developed, well-nourished, no acute distress. Eyes: anicteric sclera. Lungs: Clear to auscultation bilaterally. Heart: Regular rate and rhythm. No rubs, murmurs, or gallops. Abdomen: Soft, nontender, nondistended. No organomegaly noted, normoactive bowel sounds. Colostomy noted. Musculoskeletal: No edema, cyanosis, or clubbing. Neuro: Alert, answering all questions appropriately. Cranial nerves grossly intact. Skin: No rashes or petechiae noted. Psych: Normal affect. Lymphatics: No cervical, calvicular,  axillary or inguinal LAD.   LAB RESULTS:  Lab Results  Component Value Date   NA 136 07/28/2014   K 4.4 07/28/2014   CL 104 07/28/2014   CO2 27 07/28/2014   GLUCOSE 105* 07/28/2014   BUN 17 07/28/2014   CREATININE 1.08 07/28/2014   CALCIUM 8.5* 07/28/2014   PROT 6.9 07/28/2014   ALBUMIN 4.1 07/28/2014   AST 16 07/28/2014   ALT 13* 07/28/2014   ALKPHOS 62 07/28/2014   BILITOT 0.6 07/28/2014   GFRNONAA >60 07/28/2014   GFRAA >60 07/28/2014    Lab Results  Component Value Date   WBC 3.9 04/29/2015   NEUTROABS 2.8 04/29/2015   HGB 14.3 04/29/2015   HCT 41.4 04/29/2015   MCV 86.0 04/29/2015   PLT 370 04/29/2015   Lab Results  Component Value Date   CEA 0.8 04/29/2015     STUDIES: Ct Abdomen Pelvis W Contrast  04/26/2015  CLINICAL DATA:  Followup colorectal carcinoma. Status post surgery, chemotherapy, and radiation therapy. EXAM: CT ABDOMEN AND PELVIS WITH CONTRAST TECHNIQUE: Multidetector CT imaging of the abdomen and pelvis was performed using the standard protocol following bolus administration of intravenous contrast. CONTRAST:  100 mL Isovue 370 COMPARISON:  10/22/2014 FINDINGS: Lower chest:  No acute findings. Hepatobiliary: No masses or other significant abnormality. Prior cholecystectomy noted. No evidence of biliary dilatation. Pancreas: No mass, inflammatory changes, or other significant abnormality. Spleen: Within normal limits in size and appearance. Adrenals/Urinary Tract: No masses identified. No evidence of hydronephrosis. Stomach/Bowel: Left lower quadrant colostomy is again demonstrated. Extensive left-sided colonic diverticulosis is again demonstrated, however there is no evidence of diverticulitis. A ill-defined left pelvic mass is again seen involving the left internal iliac vessels, which currently measures 4.3 x 4.5 cm on image 69/ series 2 compared to 4.4 x 4.5 cm previously. This is highly suspicious for tumor. Located inferiorly in the central presacral  region, there is persistent ill-defined soft tissue density which measures 3.3 x 4.4 cm on image 79/series 2 compared to 3.4 x 4.4 cm when remeasured at this level on prior study. This could be due to tumor or post treatment changes. No new masses or lymphadenopathy. Vascular/Lymphatic: See stomach/bowel section above.  No evidence of abdominal aortic aneurysm. Reproductive: No mass or other significant abnormality. Other: None. Musculoskeletal:  No suspicious bone lesions identified. IMPRESSION: No significant change in size of ill-defined left pelvic sidewall mass, highly suspicious for tumor. Stable ill-defined soft tissue density in the presacral region, which could be due to posttreatment changes or tumor. No new or progressive disease identified within the abdomen or pelvis. Colonic diverticulosis and left lower quadrant colostomy. No radiographic evidence of diverticulitis. Electronically Signed   By: Earle Gell M.D.   On: 04/26/2015 09:08    ASSESSMENT: Pathologic stage I rectal adenocarcinoma at the anal verge. (previously clinical stage IIIa)   PLAN:    1.  Rectal cancer: Patient completed neoadjuvant 5-FU and XRT as well as surgery. Pathologic stage as above. Previously patient declined adjuvant FOLFOX therapy. He  expressed understanding of the increased risk of recurrence by foregoing chemotherapy. CT scan results reviewed independently and reported as above with irregular soft tissue mass in the left aspect of the pelvis. Because of this, will repeat imaging in 6 months to assess for interval change. Patient will return to clinic after his imaging for further evaluation.  2. Colostomy: Continue stoma care as indicated.  Patient expressed understanding and was in agreement with this plan. He also understands that He can call clinic at any time with any questions, concerns, or complaints.    Lloyd Huger, MD   05/06/2015 10:13 AM

## 2015-06-02 DIAGNOSIS — H40003 Preglaucoma, unspecified, bilateral: Secondary | ICD-10-CM | POA: Diagnosis not present

## 2015-06-04 DIAGNOSIS — C785 Secondary malignant neoplasm of large intestine and rectum: Secondary | ICD-10-CM | POA: Diagnosis not present

## 2015-06-22 ENCOUNTER — Telehealth: Payer: Self-pay | Admitting: Surgery

## 2015-06-22 NOTE — Telephone Encounter (Signed)
Patient has called and would like to know whom he should add as a provider to his colostomy prescription since Dr Rexene Edison is no longer with [facility]. He stated that the information could be left on his voicemail.   I reviewed other notes and this was the last contact number for the supplies Judeen Hammans from [facility] has called in referance to [employer]. She is requesting for the sx to sign off on a new Rx.)

## 2015-06-22 NOTE — Telephone Encounter (Signed)
Called and spoke with Judeen Hammans at this time. It was explained that Dr. Rexene Edison has left our practice and the only other provider that has seen the patient in the practice is Dr. Burt Knack and any new orders for ostomy supplies can be sent via fax to Dr. Burt Knack. She verbalizes understanding of this.

## 2015-07-05 DIAGNOSIS — C785 Secondary malignant neoplasm of large intestine and rectum: Secondary | ICD-10-CM | POA: Diagnosis not present

## 2015-07-19 ENCOUNTER — Ambulatory Visit
Admission: RE | Admit: 2015-07-19 | Discharge: 2015-07-19 | Disposition: A | Discharge status: Home / Self Care | Payer: 59 | Source: Ambulatory Visit | Attending: Radiation Oncology | Admitting: Radiation Oncology

## 2015-07-19 ENCOUNTER — Encounter: Payer: Self-pay | Admitting: Radiation Oncology

## 2015-07-19 VITALS — BP 143/83 | HR 83 | Temp 96.5°F | Resp 20 | Wt 278.7 lb

## 2015-07-19 DIAGNOSIS — Z85048 Personal history of other malignant neoplasm of rectum, rectosigmoid junction, and anus: Secondary | ICD-10-CM | POA: Diagnosis not present

## 2015-07-19 DIAGNOSIS — Z923 Personal history of irradiation: Secondary | ICD-10-CM | POA: Insufficient documentation

## 2015-07-19 DIAGNOSIS — C20 Malignant neoplasm of rectum: Secondary | ICD-10-CM | POA: Diagnosis not present

## 2015-07-19 NOTE — Progress Notes (Signed)
Radiation Oncology Follow up Note  Name: [name] [name]   Date:   07/19/2015 MRN:  [id number] DOB: Apr 10, 1955    This 60 y.o. male presents to the clinic today for 2 year follow-up for rectal cancer status post induction chemotherapy followed by AP resection.  REFERRING PROVIDER: Ashok Norris, MD  HPI: Patient is a 60 year old male now out 2 years having completed combined modality treatment in a neoadjuvant fashion for rectal cancer he underwent AP resection with only 8 T2 lesion remaining no lymph nodes involved. He is seen today in routine follow-up has done well.[name] He did not take adjuvant FOLFOX chemotherapy after completion of surgery. He has a mass on CT scan which is being followed and has been stable over time cannot rule out tumor. He specifically denies rectal pain any increased lower urinary tract symptoms or diarrhea. He does have a well-functioning colostomy present.  COMPLICATIONS OF TREATMENT: none  FOLLOW UP COMPLIANCE: keeps appointments   PHYSICAL EXAM:  BP 143/83 mmHg  Pulse 83  Temp(Src) 96.5 F (35.8 C)  Resp 20  Wt 278 lb 10.6 oz (126.4 kg) Patient is a well-functioning colostomy. His anal incision site is well-healed without evidence of mass or nodularity no inguinal adenopathy is identified. Positive bowel sounds in all 4 quadrants. Well-developed well-nourished patient in NAD. HEENT reveals PERLA, EOMI, discs not visualized.  Oral cavity is clear. No oral mucosal lesions are identified. Neck is clear without evidence of cervical or supraclavicular adenopathy. Lungs are clear to A&P. Cardiac examination is essentially unremarkable with regular rate and rhythm without murmur rub or thrill. Abdomen is benign with no organomegaly or masses noted. Motor sensory and DTR levels are equal and symmetric in the upper and lower extremities. Cranial nerves II through XII are grossly intact. Proprioception is intact. No peripheral adenopathy or edema is identified. No motor or  sensory levels are noted. Crude visual fields are within normal range.  RADIOLOGY RESULTS: Most recent CT scan is reviewed and compatible with the above-stated findings  PLAN: Present time he continues to do well. I've personally reviewed his CT scan is is probably an area of scarring without increased in size over time highly doubt this is malignant disease. I've asked to see him back in 1 year for follow-up. He'll rescheduled follow-up CT scans. Patient knows to call with any concerns.  I would like to take this opportunity to thank you for allowing me to participate in the care of your patient.Armstead Peaks., MD

## 2015-08-04 DIAGNOSIS — C785 Secondary malignant neoplasm of large intestine and rectum: Secondary | ICD-10-CM | POA: Diagnosis not present

## 2015-09-03 DIAGNOSIS — C785 Secondary malignant neoplasm of large intestine and rectum: Secondary | ICD-10-CM | POA: Diagnosis not present

## 2015-10-01 DIAGNOSIS — C785 Secondary malignant neoplasm of large intestine and rectum: Secondary | ICD-10-CM | POA: Diagnosis not present

## 2015-10-29 ENCOUNTER — Ambulatory Visit
Admission: RE | Admit: 2015-10-29 | Discharge: 2015-10-29 | Disposition: A | Discharge status: Home / Self Care | Payer: 59 | Source: Ambulatory Visit | Attending: Oncology | Admitting: Oncology

## 2015-10-29 ENCOUNTER — Inpatient Hospital Stay: Payer: 59 | Attending: Oncology

## 2015-10-29 ENCOUNTER — Other Ambulatory Visit: Payer: 59

## 2015-10-29 DIAGNOSIS — Z79899 Other long term (current) drug therapy: Secondary | ICD-10-CM | POA: Insufficient documentation

## 2015-10-29 DIAGNOSIS — C20 Malignant neoplasm of rectum: Secondary | ICD-10-CM | POA: Insufficient documentation

## 2015-10-29 DIAGNOSIS — Z9049 Acquired absence of other specified parts of digestive tract: Secondary | ICD-10-CM | POA: Insufficient documentation

## 2015-10-29 DIAGNOSIS — Z933 Colostomy status: Secondary | ICD-10-CM | POA: Insufficient documentation

## 2015-10-29 LAB — CBC WITH DIFFERENTIAL/PLATELET
BASOS ABS: 0 10*3/uL (ref 0–0.1)
BASOS PCT: 0 %
EOS ABS: 0.2 10*3/uL (ref 0–0.7)
EOS PCT: 5 %
HEMATOCRIT: 41.8 % (ref 40.0–52.0)
Hemoglobin: 14.4 g/dL (ref 13.0–18.0)
Lymphocytes Relative: 19 %
Lymphs Abs: 0.7 10*3/uL — ABNORMAL LOW (ref 1.0–3.6)
MCH: 30.2 pg (ref 26.0–34.0)
MCHC: 34.5 g/dL (ref 32.0–36.0)
MCV: 87.4 fL (ref 80.0–100.0)
MONO ABS: 0.3 10*3/uL (ref 0.2–1.0)
MONOS PCT: 9 %
Neutro Abs: 2.3 10*3/uL (ref 1.4–6.5)
Neutrophils Relative %: 67 %
PLATELETS: 336 10*3/uL (ref 150–440)
RBC: 4.78 MIL/uL (ref 4.40–5.90)
RDW: 14 % (ref 11.5–14.5)
WBC: 3.5 10*3/uL — ABNORMAL LOW (ref 3.8–10.6)

## 2015-10-29 MED ORDER — IOPAMIDOL (ISOVUE-300) INJECTION 61%
100.0000 mL | Freq: Once | INTRAVENOUS | Status: AC | PRN
  Administered 2015-10-29: 100 mL via INTRAVENOUS

## 2015-10-30 LAB — CEA: CEA: 0.9 ng/mL (ref 0.0–4.7)

## 2015-10-31 NOTE — Progress Notes (Signed)
[name]  [phone] Fax:(336) [phone]  ID: [name] [name] [place] OB: 02-21-1955  MR#: [id number]  BDZ#:[id number]  Patient Care Team: Ashok Norris, MD as PCP - General (Family Medicine) Marlyce Huge, MD as Attending Physician (Surgery)  CHIEF COMPLAINT: Pathologic stage I rectal adenocarcinoma at the anal verge.  INTERVAL HISTORY: Patient returns to clinic today for laboratory work and routine 6 month evaluation. He continues to feel well and is asymptomatic. He is having no problems with his colostomy. He denies any recent fevers.  He has no neurologic complaints.  He denies any chest pain or shortness of breath.  He denies nausea, vomiting, constipation, or diarrhea. He has noted no blood or melena in his colostomy bag.  He has no urinary complaints.  Patient offers no specific complaints today.  REVIEW OF SYSTEMS:   Review of Systems  Constitutional: Negative.  Negative for fever, malaise/fatigue and weight loss.  Respiratory: Negative.  Negative for cough and shortness of breath.   Cardiovascular: Negative.  Negative for chest pain and leg swelling.  Gastrointestinal: Negative.  Negative for abdominal pain, blood in stool, constipation, diarrhea and melena.  Genitourinary: Negative.   Neurological: Negative.  Negative for weakness.  Psychiatric/Behavioral: Negative.  The patient is not nervous/anxious.     As per HPI. Otherwise, a complete review of systems is negative.  PAST MEDICAL HISTORY: Past Medical History:  Diagnosis Date  . Anemia   . Cancer ([place])    rectal 2015  . Colostomy in place [facility])   . Kidney stones   . Seasonal allergies     PAST SURGICAL HISTORY: Past Surgical History:  Procedure Laterality Date  . BACK SURGERY    . CHOLECYSTECTOMY    . COLON SURGERY    . KNEE SURGERY Bilateral   . PORT-A-CATH REMOVAL Right 12/02/2014   Procedure: REMOVAL PORT-A-CATH;  Surgeon: Marlyce Huge, MD;  Location: [facility] ORS;   Service: General;  Laterality: Right;  . PORTACATH PLACEMENT      FAMILY HISTORY: Father with prostate cancer.  COPD, CHF.     ADVANCED DIRECTIVES:    HEALTH MAINTENANCE: Social History  Substance Use Topics  . Smoking status: Never Smoker  . Smokeless tobacco: Current User    Types: Snuff  . Alcohol use 0.0 oz/week     Comment: RARE     Colonoscopy:  PAP:  Bone density:  Lipid panel:  Allergies  Allergen Reactions  . Sulfa Antibiotics Hives    Other reaction(s): Other (qualifier value)    Current Outpatient Prescriptions  Medication Sig Dispense Refill  . cetirizine (ZYRTEC) 10 MG tablet Take 10 mg by mouth daily.    . diazepam (VALIUM) 5 MG tablet Take 1 tablet (5 mg total) by mouth every 8 (eight) hours as needed for anxiety. 40 tablet 1  . Loperamide HCl (IMODIUM A-D PO) Take by mouth as needed.    . sodium chloride (OCEAN) 0.65 % SOLN nasal spray Place 1 spray into both nostrils as needed for congestion.    . timolol (BETIMOL) 0.5 % ophthalmic solution Place 1 drop into the right eye daily.     No current facility-administered medications for this visit.     OBJECTIVE: Vitals:   11/02/15 1000  BP: (!) 161/94  Pulse: 70  Resp: 18  Temp: (!) 95 F (35 C)     Body mass index is 34.65 kg/m.    ECOG FS:0 - Asymptomatic  General: Well-developed, well-nourished, no acute distress. Eyes: anicteric sclera. Lungs: Clear  to auscultation bilaterally. Heart: Regular rate and rhythm. No rubs, murmurs, or gallops. Abdomen: Soft, nontender, nondistended. No organomegaly noted, normoactive bowel sounds. Colostomy noted. Musculoskeletal: No edema, cyanosis, or clubbing. Neuro: Alert, answering all questions appropriately. Cranial nerves grossly intact. Skin: No rashes or petechiae noted. Psych: Normal affect. Lymphatics: No cervical, calvicular, axillary or inguinal LAD.   LAB RESULTS:  Lab Results  Component Value Date   NA 136 07/28/2014   K 4.4 07/28/2014     CL 104 07/28/2014   CO2 27 07/28/2014   GLUCOSE 105 (H) 07/28/2014   BUN 17 07/28/2014   CREATININE 1.08 07/28/2014   CALCIUM 8.5 (L) 07/28/2014   PROT 6.9 07/28/2014   ALBUMIN 4.1 07/28/2014   AST 16 07/28/2014   ALT 13 (L) 07/28/2014   ALKPHOS 62 07/28/2014   BILITOT 0.6 07/28/2014   GFRNONAA >60 07/28/2014   GFRAA >60 07/28/2014    Lab Results  Component Value Date   WBC 3.5 (L) 10/29/2015   NEUTROABS 2.3 10/29/2015   HGB 14.4 10/29/2015   HCT 41.8 10/29/2015   MCV 87.4 10/29/2015   PLT 336 10/29/2015   Lab Results  Component Value Date   CEA 0.9 10/29/2015     STUDIES: Ct Abdomen Pelvis W Contrast  Result Date: 10/29/2015 CLINICAL DATA:  Rectal carcinoma, cholecystectomy. EXAM: CT ABDOMEN AND PELVIS WITH CONTRAST TECHNIQUE: Multidetector CT imaging of the abdomen and pelvis was performed using the standard protocol following bolus administration of intravenous contrast. CONTRAST:  1104m ISOVUE-300 IOPAMIDOL (ISOVUE-300) INJECTION 61% COMPARISON:  CT 04/26/2015 FINDINGS: Lower chest: Lung bases are clear. Hepatobiliary: No focal hepatic lesion. Postcholecystectomy. No biliary dilatation. Pancreas: Pancreas is normal. No ductal dilatation. No pancreatic inflammation. Spleen: Normal spleen Adrenals/urinary tract: Adrenal glands and kidneys are normal. The ureters and bladder normal. Stomach/Bowel: Stomach, small-bowel, cecum normal. Appendix normal. The ascending transverse colon normal. The descending colon extends through a LEFT abdominal colostomy. Multiple diverticula of the descending colon. No acute inflammation. Patient status post proctosigmoidectomy. There is thickening in the presacral space just posterior to the seminal vesicles measuring 3.7 by 2.4 cm decreased from 4.4 x 3.3 cm. More superiorly along the LEFT peritoneal reflection surgical collection is contracted measuring 1.9 by 2.6 cm decreased from 4.3 x 4.5 cm. No new nodularity is presacral postsurgical space.  Vascular/Lymphatic: No evidence of lymphadenopathy in the pelvis. No periaortic adenopathy. No periportal adenopathy Reproductive: Prostate gland normal. Other: No peritoneal disease. Musculoskeletal: No aggressive osseous lesion. IMPRESSION: 1. No evidence of local rectal carcinoma recurrence. 2. Stable to contracted postsurgical change in the presacral space. 3. LEFT lower quadrant colostomy without complication. 4. No evidence of hepatic metastasis. Electronically Signed   By: SSuzy BouchardM.D.   On: 10/29/2015 13:55    ASSESSMENT: Pathologic stage I rectal adenocarcinoma at the anal verge. (previously clinical stage IIIa)   PLAN:    1.  Pathologic stage I rectal adenocarcinoma at the anal verge: Patient completed neoadjuvant 5-FU and XRT followed by surgical excision on October 08, 2013. Pathologic stage as above. Previously patient declined adjuvant FOLFOX therapy. He expressed understanding of the increased risk of recurrence by foregoing adjuvant chemotherapy. CT scan results reviewed independently with no evidence of recurrence. No intervention is needed at this time. No further imaging is necessary. Return to clinic in 6 months for laboratory work only and then in one year for laboratory work and further evaluation.  2. Colostomy: Continue stoma care as indicated.  Patient expressed understanding and was in agreement  with this plan. He also understands that He can call clinic at any time with any questions, concerns, or complaints.    Lloyd Huger, MD   11/07/2015 7:31 AM

## 2015-11-02 ENCOUNTER — Inpatient Hospital Stay (HOSPITAL_BASED_OUTPATIENT_CLINIC_OR_DEPARTMENT_OTHER): Payer: 59 | Admitting: Oncology

## 2015-11-02 VITALS — BP 161/94 | HR 70 | Temp 95.0°F | Resp 18 | Wt 277.2 lb

## 2015-11-02 DIAGNOSIS — Z9049 Acquired absence of other specified parts of digestive tract: Secondary | ICD-10-CM | POA: Diagnosis not present

## 2015-11-02 DIAGNOSIS — C20 Malignant neoplasm of rectum: Secondary | ICD-10-CM | POA: Diagnosis not present

## 2015-11-02 DIAGNOSIS — Z933 Colostomy status: Secondary | ICD-10-CM

## 2015-11-02 DIAGNOSIS — Z79899 Other long term (current) drug therapy: Secondary | ICD-10-CM | POA: Diagnosis not present

## 2015-11-02 DIAGNOSIS — C785 Secondary malignant neoplasm of large intestine and rectum: Secondary | ICD-10-CM | POA: Diagnosis not present

## 2015-11-02 MED ORDER — DIAZEPAM 5 MG PO TABS: 5.0000 mg | ORAL_TABLET | Freq: Three times a day (TID) | ORAL | 1 refills | Status: DC | PRN

## 2015-11-02 NOTE — Progress Notes (Signed)
States is feeling well. Offers no complaints. 

## 2015-12-01 DIAGNOSIS — H40003 Preglaucoma, unspecified, bilateral: Secondary | ICD-10-CM | POA: Diagnosis not present

## 2015-12-03 DIAGNOSIS — C785 Secondary malignant neoplasm of large intestine and rectum: Secondary | ICD-10-CM | POA: Diagnosis not present

## 2015-12-08 DIAGNOSIS — H40003 Preglaucoma, unspecified, bilateral: Secondary | ICD-10-CM | POA: Diagnosis not present

## 2015-12-31 DIAGNOSIS — C785 Secondary malignant neoplasm of large intestine and rectum: Secondary | ICD-10-CM | POA: Diagnosis not present

## 2016-01-10 ENCOUNTER — Encounter: Payer: 59 | Admitting: Family Medicine

## 2016-01-28 DIAGNOSIS — C785 Secondary malignant neoplasm of large intestine and rectum: Secondary | ICD-10-CM | POA: Diagnosis not present

## 2016-02-08 DIAGNOSIS — H40003 Preglaucoma, unspecified, bilateral: Secondary | ICD-10-CM | POA: Diagnosis not present

## 2016-02-21 ENCOUNTER — Encounter: Payer: Self-pay | Admitting: Family Medicine

## 2016-02-21 ENCOUNTER — Ambulatory Visit (INDEPENDENT_AMBULATORY_CARE_PROVIDER_SITE_OTHER): Payer: 59 | Admitting: Family Medicine

## 2016-02-21 VITALS — BP 141/70 | HR 95 | Temp 98.5°F | Resp 16 | Ht 75.0 in | Wt 282.4 lb

## 2016-02-21 DIAGNOSIS — Z933 Colostomy status: Secondary | ICD-10-CM

## 2016-02-21 DIAGNOSIS — Z23 Encounter for immunization: Secondary | ICD-10-CM | POA: Diagnosis not present

## 2016-02-21 DIAGNOSIS — C20 Malignant neoplasm of rectum: Secondary | ICD-10-CM

## 2016-02-21 DIAGNOSIS — Z85048 Personal history of other malignant neoplasm of rectum, rectosigmoid junction, and anus: Secondary | ICD-10-CM | POA: Diagnosis not present

## 2016-02-21 DIAGNOSIS — Z0000 Encounter for general adult medical examination without abnormal findings: Secondary | ICD-10-CM | POA: Diagnosis not present

## 2016-02-21 NOTE — Progress Notes (Signed)
Name: [name] [name]   MRN: [id number]    DOB: [birth year]   Date:02/21/2016       Progress Note  Subjective  Chief Complaint  Chief Complaint  Patient presents with  . Annual Exam    CPE  This patient is new to me, has been usually followed by Dr. Lucita Lora. Records reviewed  HPI  Pt. Presents for a Complete Physical Exam.   Past Medical History:  Diagnosis Date  . Anemia   . Cancer ([place])    rectal 2015  . Colostomy in place [facility])   . Kidney stones   . Seasonal allergies     Past Surgical History:  Procedure Laterality Date  . BACK SURGERY    . CHOLECYSTECTOMY    . COLON SURGERY    . KNEE SURGERY Bilateral   . PORT-A-CATH REMOVAL Right 12/02/2014   Procedure: REMOVAL PORT-A-CATH;  Surgeon: Marlyce Huge, MD;  Location: [facility] ORS;  Service: General;  Laterality: Right;  . PORTACATH PLACEMENT      Family History  Problem Relation Age of Onset  . Cancer Mother   . Hypertension Mother   . Cancer Father     Social History   Social History  . Marital status: Married    Spouse name: N/A  . Number of children: N/A  . Years of education: N/A   Occupational History  . Not on file.   Social History Main Topics  . Smoking status: Never Smoker  . Smokeless tobacco: Current User    Types: Snuff  . Alcohol use 0.0 oz/week     Comment: RARE  . Drug use: No  . Sexual activity: Not on file   Other Topics Concern  . Not on file   Social History Narrative  . No narrative on file     Current Outpatient Prescriptions:  .  calcium carbonate (CALCIUM 600) 600 MG TABS tablet, Take 600 mg by mouth daily., Disp: , Rfl:  .  cetirizine (ZYRTEC) 10 MG tablet, Take 10 mg by mouth daily., Disp: , Rfl:  .  diazepam (VALIUM) 5 MG tablet, Take 1 tablet (5 mg total) by mouth every 8 (eight) hours as needed for anxiety., Disp: 40 tablet, Rfl: 1 .  Loperamide HCl (IMODIUM A-D PO), Take by mouth as needed., Disp: , Rfl:  .  Magnesium 400 MG CAPS, Take 1 capsule by mouth at  bedtime., Disp: , Rfl:  .  Multiple Vitamin (MULTIVITAMIN) tablet, Take 1 tablet by mouth daily., Disp: , Rfl:  .  sodium chloride (OCEAN) 0.65 % SOLN nasal spray, Place 1 spray into both nostrils as needed for congestion., Disp: , Rfl:  .  timolol (BETIMOL) 0.5 % ophthalmic solution, Place 1 drop into the right eye daily., Disp: , Rfl:   Allergies  Allergen Reactions  . Sulfa Antibiotics Hives    Other reaction(s): Other (qualifier value)     Review of Systems  Constitutional: Negative for chills, fever and malaise/fatigue.  HENT: Negative for congestion, sinus pain and sore throat.   Eyes: Negative for blurred vision (limited vision in right eye) and double vision.  Respiratory: Negative for cough and shortness of breath.   Cardiovascular: Negative for chest pain, palpitations and leg swelling.  Gastrointestinal: Negative for abdominal pain, blood in stool, melena, nausea and vomiting.  Genitourinary: Negative for dysuria and hematuria.  Musculoskeletal: Negative for back pain (degenerative disc disease), joint pain and neck pain.  Neurological: Negative for dizziness and headaches.  Psychiatric/Behavioral: Negative for depression. The  patient is not nervous/anxious and does not have insomnia.     Objective  Vitals:   02/21/16 1100  BP: (!) 141/70  Pulse: 95  Resp: 16  Temp: 98.5 F (36.9 C)  TempSrc: Oral  SpO2: 96%  Weight: 282 lb 6.4 oz (128.1 kg)  Height: '6\' 3"'$  (1.905 m)    Physical Exam  Constitutional: He is oriented to person, place, and time and well-developed, well-nourished, and in no distress.  HENT:  Head: Normocephalic and atraumatic.  Right Ear: Tympanic membrane and ear canal normal.  Left Ear: Tympanic membrane and ear canal normal.  Mouth/Throat: No posterior oropharyngeal erythema.  Cardiovascular: Normal rate, regular rhythm, S1 normal, S2 normal and normal heart sounds.   No murmur heard. Pulmonary/Chest: Effort normal and breath sounds normal.  He has no wheezes.  Abdominal: Soft. Bowel sounds are normal. There is no tenderness.    Colostomy bag in place.  Musculoskeletal:       Right ankle: He exhibits no swelling.       Left ankle: He exhibits no swelling.  Neurological: He is alert and oriented to person, place, and time.  Skin: Skin is warm, dry and intact.  Psychiatric: Mood, memory, affect and judgment normal.  Nursing note and vitals reviewed.     Assessment & Plan  1. Annual physical exam Obtain screening lab work. - Lipid Profile - COMPLETE METABOLIC PANEL WITH GFR - TSH - Vitamin D (25 hydroxy) - PSA - Hepatitis C Antibody  2. Need for diphtheria-tetanus-pertussis (Tdap) vaccine  - Tdap vaccine greater than or equal to 7yo IM  3. Rectal cancer [facility]) Now with permanent colostomy, and followed by oncology. Previous records reviewed and discussed with patient.   Martavius Lusty Asad A[facility] 02/21/2016 11:16 AM

## 2016-02-29 DIAGNOSIS — C785 Secondary malignant neoplasm of large intestine and rectum: Secondary | ICD-10-CM | POA: Diagnosis not present

## 2016-03-30 DIAGNOSIS — C785 Secondary malignant neoplasm of large intestine and rectum: Secondary | ICD-10-CM | POA: Diagnosis not present

## 2016-04-12 DIAGNOSIS — H40003 Preglaucoma, unspecified, bilateral: Secondary | ICD-10-CM | POA: Diagnosis not present

## 2016-04-28 DIAGNOSIS — C785 Secondary malignant neoplasm of large intestine and rectum: Secondary | ICD-10-CM | POA: Diagnosis not present

## 2016-05-02 ENCOUNTER — Inpatient Hospital Stay: Payer: 59 | Attending: Oncology

## 2016-05-02 DIAGNOSIS — Z933 Colostomy status: Secondary | ICD-10-CM | POA: Insufficient documentation

## 2016-05-02 DIAGNOSIS — Z9049 Acquired absence of other specified parts of digestive tract: Secondary | ICD-10-CM | POA: Insufficient documentation

## 2016-05-02 DIAGNOSIS — Z79899 Other long term (current) drug therapy: Secondary | ICD-10-CM | POA: Insufficient documentation

## 2016-05-02 DIAGNOSIS — C20 Malignant neoplasm of rectum: Secondary | ICD-10-CM | POA: Insufficient documentation

## 2016-05-02 LAB — CBC WITH DIFFERENTIAL/PLATELET
BASOS ABS: 0.1 10*3/uL (ref 0–0.1)
BASOS PCT: 2 %
EOS ABS: 0.2 10*3/uL (ref 0–0.7)
Eosinophils Relative: 6 %
HCT: 41.1 % (ref 40.0–52.0)
Hemoglobin: 14.1 g/dL (ref 13.0–18.0)
Lymphocytes Relative: 24 %
Lymphs Abs: 0.8 10*3/uL — ABNORMAL LOW (ref 1.0–3.6)
MCH: 29.8 pg (ref 26.0–34.0)
MCHC: 34.4 g/dL (ref 32.0–36.0)
MCV: 86.7 fL (ref 80.0–100.0)
MONO ABS: 0.3 10*3/uL (ref 0.2–1.0)
MONOS PCT: 10 %
NEUTROS ABS: 2 10*3/uL (ref 1.4–6.5)
NEUTROS PCT: 58 %
Platelets: 338 10*3/uL (ref 150–440)
RBC: 4.74 MIL/uL (ref 4.40–5.90)
RDW: 14.6 % — AB (ref 11.5–14.5)
WBC: 3.4 10*3/uL — ABNORMAL LOW (ref 3.8–10.6)

## 2016-05-03 LAB — CEA: CEA: 1.1 ng/mL (ref 0.0–4.7)

## 2016-05-30 DIAGNOSIS — C785 Secondary malignant neoplasm of large intestine and rectum: Secondary | ICD-10-CM | POA: Diagnosis not present

## 2016-07-03 ENCOUNTER — Other Ambulatory Visit: Payer: Self-pay | Admitting: Family Medicine

## 2016-07-03 ENCOUNTER — Ambulatory Visit (INDEPENDENT_AMBULATORY_CARE_PROVIDER_SITE_OTHER): Payer: 59 | Admitting: Emergency Medicine

## 2016-07-03 DIAGNOSIS — Z23 Encounter for immunization: Secondary | ICD-10-CM

## 2016-07-03 DIAGNOSIS — Z0000 Encounter for general adult medical examination without abnormal findings: Secondary | ICD-10-CM | POA: Diagnosis not present

## 2016-07-03 DIAGNOSIS — C785 Secondary malignant neoplasm of large intestine and rectum: Secondary | ICD-10-CM | POA: Diagnosis not present

## 2016-07-03 LAB — LIPID PANEL
Cholesterol: 165 mg/dL (ref <200)
HDL: 39 mg/dL — ABNORMAL LOW (ref >40)
LDL CALC: 82 mg/dL (ref <100)
Total CHOL/HDL Ratio: 4.2 Ratio (ref <5.0)
Triglycerides: 218 mg/dL — ABNORMAL HIGH (ref <150)
VLDL: 44 mg/dL — AB (ref <30)

## 2016-07-03 LAB — COMPLETE METABOLIC PANEL WITH GFR
ALT: 12 U/L (ref 9–46)
AST: 14 U/L (ref 10–35)
Albumin: 3.9 g/dL (ref 3.6–5.1)
Alkaline Phosphatase: 58 U/L (ref 40–115)
BILIRUBIN TOTAL: 0.5 mg/dL (ref 0.2–1.2)
BUN: 16 mg/dL (ref 7–25)
CHLORIDE: 104 mmol/L (ref 98–110)
CO2: 27 mmol/L (ref 20–31)
Calcium: 9.5 mg/dL (ref 8.6–10.3)
Creat: 0.99 mg/dL (ref 0.70–1.25)
GFR, Est African American: >89 mL/min (ref >=60)
GFR, Est Non African American: 82 mL/min (ref >=60)
Glucose, Bld: 100 mg/dL — ABNORMAL HIGH (ref 65–99)
Potassium: 4.9 mmol/L (ref 3.5–5.3)
Sodium: 141 mmol/L (ref 135–146)
TOTAL PROTEIN: 6.7 g/dL (ref 6.1–8.1)

## 2016-07-03 LAB — HEPATITIS C ANTIBODY: HCV AB: NEGATIVE

## 2016-07-03 LAB — TSH: TSH: 3.96 m[IU]/L (ref 0.40–4.50)

## 2016-07-04 LAB — PSA: PSA: 1.7 ng/mL (ref <=4.0)

## 2016-07-04 LAB — VITAMIN D 25 HYDROXY (VIT D DEFICIENCY, FRACTURES): Vit D, 25-Hydroxy: 18 ng/mL — ABNORMAL LOW (ref 30–100)

## 2016-07-10 ENCOUNTER — Other Ambulatory Visit: Payer: Self-pay | Admitting: Emergency Medicine

## 2016-07-10 MED ORDER — VITAMIN D (ERGOCALCIFEROL) 1.25 MG (50000 UNIT) PO CAPS: 50000.0000 [IU] | ORAL_CAPSULE | ORAL | 0 refills | Status: DC

## 2016-07-11 ENCOUNTER — Encounter: Payer: Self-pay | Admitting: Family Medicine

## 2016-07-11 ENCOUNTER — Ambulatory Visit (INDEPENDENT_AMBULATORY_CARE_PROVIDER_SITE_OTHER): Payer: 59 | Admitting: Family Medicine

## 2016-07-11 VITALS — BP 138/79 | HR 106 | Temp 98.2°F | Resp 17 | Ht 75.0 in | Wt 282.6 lb

## 2016-07-11 DIAGNOSIS — R5383 Other fatigue: Secondary | ICD-10-CM | POA: Diagnosis not present

## 2016-07-11 DIAGNOSIS — E781 Pure hyperglyceridemia: Secondary | ICD-10-CM

## 2016-07-11 DIAGNOSIS — E559 Vitamin D deficiency, unspecified: Secondary | ICD-10-CM

## 2016-07-11 NOTE — Progress Notes (Signed)
Name: [name] [name]   MRN: [id number]    DOB: 06-Apr-1955   Date:07/11/2016       Progress Note  Subjective  Chief Complaint  Chief Complaint  Patient presents with  . Follow-up    Discuss lab results    HPI  Patient presents for discussion on starting statin for primary prevention, His 10-yr risk of CVD was calculated to be 10%, he has never been on statin. Would like to start with dietary changes and exercise first before getting on statin.   Patient is also interested in Testosterone replacement therapy, he has been on Androgel before for fatigue (low energy) and weight gain. He took it for 2 years and then stopped for a rising PSA levels. He was then diagnosed with rectal cancer now s/p rectal surgery and has a colostomy bag now.  He is interested in resuming testosterone replacement therapy because he is feeling low in energy and has started to gainw eight.   Past Medical History:  Diagnosis Date  . Anemia   . Cancer [facility])    rectal 2015, followed by Oncology, permanent colostomy  . Colostomy in place [facility])   . Kidney stones   . Seasonal allergies     Past Surgical History:  Procedure Laterality Date  . BACK SURGERY    . CHOLECYSTECTOMY    . COLON SURGERY    . KNEE SURGERY Bilateral   . PORT-A-CATH REMOVAL Right 12/02/2014   Procedure: REMOVAL PORT-A-CATH;  Surgeon: Marlyce Huge, MD;  Location: [facility] ORS;  Service: General;  Laterality: Right;  . PORTACATH PLACEMENT      Family History  Problem Relation Age of Onset  . Cancer Mother   . Hypertension Mother   . COPD Mother   . COPD Father   . Cancer Father        Prostate CA, on Lupron    Social History   Social History  . Marital status: Married    Spouse name: N/A  . Number of children: N/A  . Years of education: N/A   Occupational History  . Not on file.   Social History Main Topics  . Smoking status: Never Smoker  . Smokeless tobacco: Current User    Types: Snuff  . Alcohol use 0.0  oz/week     Comment: RARE  . Drug use: No  . Sexual activity: Not on file   Other Topics Concern  . Not on file   Social History Narrative  . No narrative on file     Current Outpatient Prescriptions:  .  calcium carbonate (CALCIUM 600) 600 MG TABS tablet, Take 600 mg by mouth daily., Disp: , Rfl:  .  cetirizine (ZYRTEC) 10 MG tablet, Take 10 mg by mouth daily., Disp: , Rfl:  .  Loperamide HCl (IMODIUM A-D PO), Take by mouth as needed., Disp: , Rfl:  .  Magnesium 400 MG CAPS, Take 1 capsule by mouth at bedtime., Disp: , Rfl:  .  Multiple Vitamin (MULTIVITAMIN) tablet, Take 1 tablet by mouth daily., Disp: , Rfl:  .  sodium chloride (OCEAN) 0.65 % SOLN nasal spray, Place 1 spray into both nostrils as needed for congestion., Disp: , Rfl:  .  timolol (BETIMOL) 0.5 % ophthalmic solution, Place 1 drop into the right eye daily., Disp: , Rfl:  .  Vitamin D, Ergocalciferol, (DRISDOL) 50000 units CAPS capsule, Take 1 capsule (50,000 Units total) by mouth every 7 (seven) days., Disp: 12 capsule, Rfl: 0 .  diazepam (VALIUM) 5  MG tablet, Take 1 tablet (5 mg total) by mouth every 8 (eight) hours as needed for anxiety. (Patient not taking: Reported on 07/11/2016), Disp: 40 tablet, Rfl: 1  Allergies  Allergen Reactions  . Sulfa Antibiotics Hives    Other reaction(s): Other (qualifier value)     ROS  Please see history of present illness for complete discussion of ROS  Objective  Vitals:   07/11/16 1022  BP: 138/79  Pulse: (!) 106  Resp: 17  Temp: 98.2 F (36.8 C)  TempSrc: Oral  SpO2: 97%  Weight: 282 lb 9.6 oz (128.2 kg)  Height: 6\' 3"  (1.905 m)    Physical Exam  Constitutional: He is oriented to person, place, and time and well-developed, well-nourished, and in no distress.  HENT:  Head: Normocephalic and atraumatic.  Cardiovascular: Normal rate, regular rhythm and normal heart sounds.   No murmur heard. Pulmonary/Chest: Effort normal and breath sounds normal. He has no  wheezes.  Abdominal: Soft. Bowel sounds are normal. There is no tenderness.  Neurological: He is alert and oriented to person, place, and time.  Psychiatric: Mood, memory, affect and judgment normal.  Nursing note and vitals reviewed.      Assessment & Plan  1. Vitamin D deficiency Now started on vitamin D replacement  2. Fatigue, unspecified type  We'll research the possibility of testosterone replacement therapy after surgery for rectal and colon resection.   3. Hypertriglyceridemia Patient elects to improve dietary and lifestyle factors first before starting on statin, recheck FLP in 3 months   [name] [name] [facility] 07/11/2016 10:26 AM

## 2016-07-13 DIAGNOSIS — H524 Presbyopia: Secondary | ICD-10-CM | POA: Diagnosis not present

## 2016-07-17 ENCOUNTER — Ambulatory Visit
Admission: RE | Admit: 2016-07-17 | Discharge: 2016-07-17 | Disposition: A | Discharge status: Home / Self Care | Payer: 59 | Source: Ambulatory Visit | Attending: Radiation Oncology | Admitting: Radiation Oncology

## 2016-07-17 ENCOUNTER — Encounter: Payer: Self-pay | Admitting: Radiation Oncology

## 2016-07-17 VITALS — BP 152/105 | HR 74 | Temp 97.1°F | Resp 18 | Wt 282.0 lb

## 2016-07-17 DIAGNOSIS — Z85048 Personal history of other malignant neoplasm of rectum, rectosigmoid junction, and anus: Secondary | ICD-10-CM | POA: Diagnosis not present

## 2016-07-17 DIAGNOSIS — Z9221 Personal history of antineoplastic chemotherapy: Secondary | ICD-10-CM | POA: Diagnosis not present

## 2016-07-17 DIAGNOSIS — C20 Malignant neoplasm of rectum: Secondary | ICD-10-CM

## 2016-07-17 NOTE — Progress Notes (Signed)
Radiation Oncology Follow up Note  Name: [name] [name]   Date:   07/17/2016 MRN:  [id number] DOB: [birth year]    This 61 y.o. male presents to the clinic today for 3 year follow-up status post new adjuvant chemoradiation for rectal cancer.  REFERRING PROVIDER: Ashok Norris, MD  HPI: Patient is a 60 year old male now out 3 years having completed combined modality treatment in a preoperative fashion for rectal cancer. He had an AP resection with a T2 lesion remaining no lymph nodes involved. He also completed adjuvant FOLFOX chemotherapy. Seen today in routine follow-up he is doing well. He specifically denies any increased lower urinary tract symptoms. He states his colostomy is functioning well. He is having no abdominal pain..  COMPLICATIONS OF TREATMENT: none  FOLLOW UP COMPLIANCE: keeps appointments   PHYSICAL EXAM:  BP (!) 152/105   Pulse 74   Temp 97.1 F (36.2 C)   Resp 18   Wt 281 lb 15.5 oz (127.9 kg)   BMI 35.24 kg/m  Patient is a functioning colostomy. Anal scar is well-healed. Well-developed well-nourished patient in NAD. HEENT reveals PERLA, EOMI, discs not visualized.  Oral cavity is clear. No oral mucosal lesions are identified. Neck is clear without evidence of cervical or supraclavicular adenopathy. Lungs are clear to A&P. Cardiac examination is essentially unremarkable with regular rate and rhythm without murmur rub or thrill. Abdomen is benign with no organomegaly or masses noted. Motor sensory and DTR levels are equal and symmetric in the upper and lower extremities. Cranial nerves II through XII are grossly intact. Proprioception is intact. No peripheral adenopathy or edema is identified. No motor or sensory levels are noted. Crude visual fields are within normal range.  RADIOLOGY RESULTS: CT scan from October 2017 is reviewed and compatible above-stated findings. There is some stranding in her's resection site which is to be expected although appears  stable.  PLAN: Present time patient continues to do well with no evidence of disease. I'm please was overall progress. I've asked to see him back in 1 year for follow-up. Patient continues close follow-up care with medical oncology. Patient knows to call with any concerns.  I would like to take this opportunity to thank you for allowing me to participate in the care of your patient.Armstead Peaks., MD

## 2016-08-02 DIAGNOSIS — C785 Secondary malignant neoplasm of large intestine and rectum: Secondary | ICD-10-CM | POA: Diagnosis not present

## 2016-09-04 DIAGNOSIS — C785 Secondary malignant neoplasm of large intestine and rectum: Secondary | ICD-10-CM | POA: Diagnosis not present

## 2016-09-29 DIAGNOSIS — C785 Secondary malignant neoplasm of large intestine and rectum: Secondary | ICD-10-CM | POA: Diagnosis not present

## 2016-10-11 ENCOUNTER — Encounter: Payer: Self-pay | Admitting: Family Medicine

## 2016-10-11 ENCOUNTER — Ambulatory Visit (INDEPENDENT_AMBULATORY_CARE_PROVIDER_SITE_OTHER): Payer: 59 | Admitting: Family Medicine

## 2016-10-11 VITALS — BP 140/75 | HR 87 | Temp 98.4°F | Resp 15 | Ht 75.0 in | Wt 286.8 lb

## 2016-10-11 DIAGNOSIS — E559 Vitamin D deficiency, unspecified: Secondary | ICD-10-CM | POA: Diagnosis not present

## 2016-10-11 DIAGNOSIS — E781 Pure hyperglyceridemia: Secondary | ICD-10-CM

## 2016-10-11 NOTE — Progress Notes (Signed)
Name: [name] [name]   MRN: [id number]    DOB: [birth year]   Date:10/11/2016       Progress Note  Subjective  Chief Complaint  Chief Complaint  Patient presents with  . Follow-up    3 mo    Hyperlipidemia  This is a recurrent problem. The problem is uncontrolled. Recent lipid tests were reviewed and are high. Current antihyperlipidemic treatment includes diet change. There are no compliance problems.    Patient has vitamin D deficiency, last level obtained in June was below normal at 18 ng per mL, he has since finished 12 weeks of vitamin D 50,000 units. We'll repeat levels today  Past Medical History:  Diagnosis Date  . Anemia   . Cancer [facility])    rectal 2015, followed by Oncology, permanent colostomy  . Colostomy in place [facility])   . Kidney stones   . Seasonal allergies     Past Surgical History:  Procedure Laterality Date  . BACK SURGERY    . CHOLECYSTECTOMY    . COLON SURGERY    . KNEE SURGERY Bilateral   . PORT-A-CATH REMOVAL Right 12/02/2014   Procedure: REMOVAL PORT-A-CATH;  Surgeon: Marlyce Huge, MD;  Location: [facility] ORS;  Service: General;  Laterality: Right;  . PORTACATH PLACEMENT      Family History  Problem Relation Age of Onset  . Cancer Mother   . Hypertension Mother   . COPD Mother   . COPD Father   . Cancer Father        Prostate CA, on Lupron    Social History   Social History  . Marital status: Married    Spouse name: N/A  . Number of children: N/A  . Years of education: N/A   Occupational History  . Not on file.   Social History Main Topics  . Smoking status: Never Smoker  . Smokeless tobacco: Current User    Types: Snuff  . Alcohol use 0.0 oz/week     Comment: RARE  . Drug use: No  . Sexual activity: Not on file   Other Topics Concern  . Not on file   Social History Narrative  . No narrative on file     Current Outpatient Prescriptions:  .  calcium carbonate (CALCIUM 600) 600 MG TABS tablet, Take 600 mg by mouth  daily., Disp: , Rfl:  .  cetirizine (ZYRTEC) 10 MG tablet, Take 10 mg by mouth daily., Disp: , Rfl:  .  Loperamide HCl (IMODIUM A-D PO), Take by mouth as needed., Disp: , Rfl:  .  Magnesium 400 MG CAPS, Take 1 capsule by mouth at bedtime., Disp: , Rfl:  .  Multiple Vitamin (MULTIVITAMIN) tablet, Take 1 tablet by mouth daily., Disp: , Rfl:  .  sodium chloride (OCEAN) 0.65 % SOLN nasal spray, Place 1 spray into both nostrils as needed for congestion., Disp: , Rfl:  .  timolol (BETIMOL) 0.5 % ophthalmic solution, Place 1 drop into the right eye daily., Disp: , Rfl:   Allergies  Allergen Reactions  . Sulfa Antibiotics Hives    Other reaction(s): Other (qualifier value)     ROS  Please see history of present illness for complete discussion of ROS  Objective  Vitals:   10/11/16 0918  BP: 140/75  Pulse: 87  Resp: 15  Temp: 98.4 F (36.9 C)  TempSrc: Oral  SpO2: 96%  Weight: 286 lb 12.8 oz (130.1 kg)  Height: 6\' 3"  (1.905 m)    Physical Exam  Constitutional: He  is oriented to person, place, and time and well-developed, well-nourished, and in no distress.  HENT:  Head: Normocephalic and atraumatic.  Cardiovascular: Normal rate, regular rhythm and normal heart sounds.   No murmur heard. Pulmonary/Chest: Effort normal and breath sounds normal. He has no wheezes.  Abdominal: Soft. Bowel sounds are normal. There is no tenderness.  Musculoskeletal: He exhibits edema.  Neurological: He is alert and oriented to person, place, and time.  Nursing note and vitals reviewed.      Assessment & Plan  1. Hypertriglyceridemia Obtaining FLP, consider starting on statin - Lipid panel  2. Vitamin D deficiency  - VITAMIN D 25 Hydroxy (Vit-D Deficiency, Fractures)   Mithra Spano Asad A. [facility] 10/11/2016 9:33 AM

## 2016-10-12 LAB — LIPID PANEL
Cholesterol: 188 mg/dL (ref <200)
HDL: 40 mg/dL — AB (ref >40)
LDL Cholesterol (Calc): 118 mg/dL (calc) — ABNORMAL HIGH
Non-HDL Cholesterol (Calc): 148 mg/dL (calc) — ABNORMAL HIGH (ref <130)
TRIGLYCERIDES: 189 mg/dL — AB (ref <150)
Total CHOL/HDL Ratio: 4.7 (calc) (ref <5.0)

## 2016-10-12 LAB — VITAMIN D 25 HYDROXY (VIT D DEFICIENCY, FRACTURES): Vit D, 25-Hydroxy: 28 ng/mL — ABNORMAL LOW (ref 30–100)

## 2016-10-27 DIAGNOSIS — C785 Secondary malignant neoplasm of large intestine and rectum: Secondary | ICD-10-CM | POA: Diagnosis not present

## 2016-11-01 ENCOUNTER — Other Ambulatory Visit: Payer: 59

## 2016-11-01 ENCOUNTER — Inpatient Hospital Stay: Payer: 59 | Admitting: Oncology

## 2016-11-06 NOTE — Progress Notes (Signed)
[place]  [phone] [phone]) [phone]  ID: [name] [name] OB: Aug 28, 1955  MR#: [id number]  XLK#:[id number]  Patient Care Team: Roselee Nova, MD as PCP - General (Family Medicine) Marlyce Huge, MD as Attending Physician (Surgery)  CHIEF COMPLAINT: Pathologic stage I rectal adenocarcinoma at the anal verge.  INTERVAL HISTORY: Patient returns to clinic today for laboratory work and routine yearly evaluation. He continues to feel well and is asymptomatic. He is having no problems with his colostomy. He denies any recent fevers.  He has no neurologic complaints.  He denies any chest pain or shortness of breath.  He denies nausea, vomiting, constipation, or diarrhea. He has noted no blood or melena in his colostomy bag.  He has no urinary complaints.  Patient offers no specific complaints today.  REVIEW OF SYSTEMS:   Review of Systems  Constitutional: Negative.  Negative for fever, malaise/fatigue and weight loss.  Respiratory: Negative.  Negative for cough and shortness of breath.   Cardiovascular: Negative.  Negative for chest pain and leg swelling.  Gastrointestinal: Negative.  Negative for abdominal pain, blood in stool, constipation, diarrhea and melena.  Genitourinary: Negative.   Skin: Negative.  Negative for rash.  Neurological: Negative.  Negative for weakness.  Psychiatric/Behavioral: Negative.  The patient is not nervous/anxious.     As per HPI. Otherwise, a complete review of systems is negative.  PAST MEDICAL HISTORY: Past Medical History:  Diagnosis Date  . Anemia   . Cancer [facility])    rectal 2015, followed by Oncology, permanent colostomy  . Colostomy in place [facility])   . Kidney stones   . Seasonal allergies     PAST SURGICAL HISTORY: Past Surgical History:  Procedure Laterality Date  . BACK SURGERY    . CHOLECYSTECTOMY    . COLON SURGERY    . KNEE SURGERY Bilateral   . PORT-A-CATH REMOVAL Right 12/02/2014   Procedure: REMOVAL PORT-A-CATH;  Surgeon: Marlyce Huge, MD;  Location: [facility] ORS;  Service: General;  Laterality: Right;  . PORTACATH PLACEMENT      FAMILY HISTORY: Father with prostate cancer.  COPD, CHF.     ADVANCED DIRECTIVES:    HEALTH MAINTENANCE: Social History  Substance Use Topics  . Smoking status: Never Smoker  . Smokeless tobacco: Current User    Types: Snuff  . Alcohol use 0.0 oz/week     Comment: RARE     Colonoscopy:  PAP:  Bone density:  Lipid panel:  Allergies  Allergen Reactions  . Sulfa Antibiotics Hives    Other reaction(s): Other (qualifier value)    Current Outpatient Prescriptions  Medication Sig Dispense Refill  . calcium carbonate (CALCIUM 600) 600 MG TABS tablet Take 600 mg by mouth daily.    . cetirizine (ZYRTEC) 10 MG tablet Take 10 mg by mouth daily.    . Loperamide HCl (IMODIUM A-D PO) Take by mouth as needed.    . Magnesium 400 MG CAPS Take 1 capsule by mouth at bedtime as needed.     . Multiple Vitamin (MULTIVITAMIN) tablet Take 1 tablet by mouth daily.    . sodium chloride (OCEAN) 0.65 % SOLN nasal spray Place 1 spray into both nostrils as needed for congestion.    . timolol (BETIMOL) 0.5 % ophthalmic solution Place 1 drop into the right eye daily.     No current facility-administered medications for this visit.     OBJECTIVE: Vitals:   11/08/16 1054  BP: (!) 163/102  Pulse: 85  Temp: 97.7  F (36.5 C)     Body mass index is 35.5 kg/m.    ECOG FS:0 - Asymptomatic  General: Well-developed, well-nourished, no acute distress. Eyes: anicteric sclera. Lungs: Clear to auscultation bilaterally. Heart: Regular rate and rhythm. No rubs, murmurs, or gallops. Abdomen: Soft, nontender, nondistended. No organomegaly noted, normoactive bowel sounds. Colostomy noted. Musculoskeletal: No edema, cyanosis, or clubbing. Neuro: Alert, answering all questions appropriately. Cranial nerves grossly intact. Skin: No rashes or petechiae  noted. Psych: Normal affect. Lymphatics: No cervical, calvicular, axillary or inguinal LAD.   LAB RESULTS:  Lab Results  Component Value Date   NA 141 07/03/2016   K 4.9 07/03/2016   CL 104 07/03/2016   CO2 27 07/03/2016   GLUCOSE 100 (H) 07/03/2016   BUN 16 07/03/2016   CREATININE 0.99 07/03/2016   CALCIUM 9.5 07/03/2016   PROT 6.7 07/03/2016   ALBUMIN 3.9 07/03/2016   AST 14 07/03/2016   ALT 12 07/03/2016   ALKPHOS 58 07/03/2016   BILITOT 0.5 07/03/2016   GFRNONAA 82 07/03/2016   GFRAA >89 07/03/2016    Lab Results  Component Value Date   WBC 3.9 11/08/2016   NEUTROABS 2.8 11/08/2016   HGB 14.1 11/08/2016   HCT 41.8 11/08/2016   MCV 88.2 11/08/2016   PLT 401 11/08/2016   Lab Results  Component Value Date   CEA 1.1 05/02/2016     STUDIES: No results found.  ASSESSMENT: Pathologic stage I rectal adenocarcinoma at the anal verge. (previously clinical stage IIIa)   PLAN:    1.  Pathologic stage I rectal adenocarcinoma at the anal verge: Patient completed neoadjuvant 5-FU and XRT followed by surgical excision on October 08, 2013. Pathologic stage as above. Previously patient declined adjuvant FOLFOX therapy. He expressed understanding of the increased risk of recurrence by foregoing adjuvant chemotherapy. CT scan results from October 29, 2015 reviewed independently with no evidence of recurrence. CEA continues to be within normal limits, today's result is pending. No intervention is needed at this time. No further imaging is necessary. Patient has not had a colonoscopy recently and states he will contact GI to schedule. Return to clinic in 6 months for laboratory work only and then in one year for laboratory work and further evaluation.  2. Colostomy: Continue stoma care as indicated.  Approximately 20 minutes was spent in discussion of which greater than 50% was consultation.  Patient expressed understanding and was in agreement with this plan. He also  understands that He can call clinic at any time with any questions, concerns, or complaints.    Lloyd Huger, MD   11/08/2016 12:18 PM

## 2016-11-07 ENCOUNTER — Other Ambulatory Visit: Payer: Self-pay | Admitting: *Deleted

## 2016-11-07 DIAGNOSIS — C20 Malignant neoplasm of rectum: Secondary | ICD-10-CM

## 2016-11-08 ENCOUNTER — Encounter: Payer: Self-pay | Admitting: Oncology

## 2016-11-08 ENCOUNTER — Inpatient Hospital Stay: Payer: 59

## 2016-11-08 ENCOUNTER — Inpatient Hospital Stay: Payer: 59 | Attending: Oncology | Admitting: Oncology

## 2016-11-08 ENCOUNTER — Other Ambulatory Visit: Payer: Self-pay

## 2016-11-08 VITALS — BP 163/102 | HR 85 | Temp 97.7°F | Wt 284.0 lb

## 2016-11-08 DIAGNOSIS — Z87442 Personal history of urinary calculi: Secondary | ICD-10-CM | POA: Insufficient documentation

## 2016-11-08 DIAGNOSIS — C20 Malignant neoplasm of rectum: Secondary | ICD-10-CM | POA: Insufficient documentation

## 2016-11-08 DIAGNOSIS — Z923 Personal history of irradiation: Secondary | ICD-10-CM | POA: Insufficient documentation

## 2016-11-08 DIAGNOSIS — Z933 Colostomy status: Secondary | ICD-10-CM | POA: Insufficient documentation

## 2016-11-08 LAB — CBC WITH DIFFERENTIAL/PLATELET
BASOS ABS: 0.1 10*3/uL (ref 0–0.1)
BASOS PCT: 1 %
EOS ABS: 0.1 10*3/uL (ref 0–0.7)
EOS PCT: 2 %
HCT: 41.8 % (ref 40.0–52.0)
HEMOGLOBIN: 14.1 g/dL (ref 13.0–18.0)
Lymphocytes Relative: 17 %
Lymphs Abs: 0.7 10*3/uL — ABNORMAL LOW (ref 1.0–3.6)
MCH: 29.9 pg (ref 26.0–34.0)
MCHC: 33.9 g/dL (ref 32.0–36.0)
MCV: 88.2 fL (ref 80.0–100.0)
Monocytes Absolute: 0.4 10*3/uL (ref 0.2–1.0)
Monocytes Relative: 9 %
NEUTROS PCT: 71 %
Neutro Abs: 2.8 10*3/uL (ref 1.4–6.5)
PLATELETS: 401 10*3/uL (ref 150–440)
RBC: 4.74 MIL/uL (ref 4.40–5.90)
RDW: 14.6 % — ABNORMAL HIGH (ref 11.5–14.5)
WBC: 3.9 10*3/uL (ref 3.8–10.6)

## 2016-11-09 LAB — CEA: CEA1: 2 ng/mL (ref 0.0–4.7)

## 2016-11-28 DIAGNOSIS — C785 Secondary malignant neoplasm of large intestine and rectum: Secondary | ICD-10-CM | POA: Diagnosis not present

## 2016-12-28 DIAGNOSIS — C785 Secondary malignant neoplasm of large intestine and rectum: Secondary | ICD-10-CM | POA: Diagnosis not present

## 2017-01-26 DIAGNOSIS — C785 Secondary malignant neoplasm of large intestine and rectum: Secondary | ICD-10-CM | POA: Diagnosis not present

## 2017-02-20 ENCOUNTER — Ambulatory Visit (INDEPENDENT_AMBULATORY_CARE_PROVIDER_SITE_OTHER): Payer: 59 | Admitting: Family Medicine

## 2017-02-20 ENCOUNTER — Encounter: Payer: Self-pay | Admitting: Family Medicine

## 2017-02-20 VITALS — BP 158/96 | HR 89 | Temp 98.3°F | Resp 14 | Ht 75.0 in | Wt 282.0 lb

## 2017-02-20 DIAGNOSIS — R030 Elevated blood-pressure reading, without diagnosis of hypertension: Secondary | ICD-10-CM

## 2017-02-20 DIAGNOSIS — E782 Mixed hyperlipidemia: Secondary | ICD-10-CM

## 2017-02-20 LAB — LIPID PANEL
CHOL/HDL RATIO: 4.7 (calc) (ref <5.0)
Cholesterol: 185 mg/dL (ref <200)
HDL: 39 mg/dL — ABNORMAL LOW (ref >40)
LDL Cholesterol (Calc): 117 mg/dL (calc) — ABNORMAL HIGH
NON-HDL CHOLESTEROL (CALC): 146 mg/dL — AB (ref <130)
Triglycerides: 176 mg/dL — ABNORMAL HIGH (ref <150)

## 2017-02-20 NOTE — Progress Notes (Signed)
Name: [name] [name]   MRN: [id number]    DOB: 1955/05/10   Date:02/20/2017       Progress Note  Subjective  Chief Complaint  Chief Complaint  Patient presents with  . Annual Exam    Hyperlipidemia  This is a chronic problem. The problem is uncontrolled. Recent lipid tests were reviewed and are high. Pertinent negatives include no leg pain, myalgias or shortness of breath. Current antihyperlipidemic treatment includes diet change (has changed his diet, avoiding fried and fatty foods., and red meat.). Risk factors for coronary artery disease include dyslipidemia.   Pt. Has persistently elevated blood pressure, worse today at 158/96 mmHg, he reports his blood pressure has been normal at home but elevated in office, his PCP before [place] told him that he may have white coat hypertension, he has never been on any anti-hypertensive medications.     Past Medical History:  Diagnosis Date  . Anemia   . Cancer [facility])    rectal 2015, followed by Oncology, permanent colostomy  . Colostomy in place [facility])   . Kidney stones   . Seasonal allergies     Past Surgical History:  Procedure Laterality Date  . BACK SURGERY    . CHOLECYSTECTOMY    . COLON SURGERY    . KNEE SURGERY Bilateral   . PORT-A-CATH REMOVAL Right 12/02/2014   Procedure: REMOVAL PORT-A-CATH;  Surgeon: Marlyce Huge, MD;  Location: [facility] ORS;  Service: General;  Laterality: Right;  . PORTACATH PLACEMENT      Family History  Problem Relation Age of Onset  . Cancer Mother   . Hypertension Mother   . COPD Mother   . COPD Father   . Cancer Father        Prostate CA, on Lupron  . Stroke Maternal Grandmother   . Cancer Paternal Grandmother   . Heart attack Paternal Grandfather   . Cancer Paternal Grandfather     Social History   Socioeconomic History  . Marital status: Married    Spouse name: Not on file  . Number of children: Not on file  . Years of education: Not on file  . Highest education level: Not on file   Social Needs  . Financial resource strain: Not on file  . Food insecurity - worry: Not on file  . Food insecurity - inability: Not on file  . Transportation needs - medical: Not on file  . Transportation needs - non-medical: Not on file  Occupational History  . Not on file  Tobacco Use  . Smoking status: Never Smoker  . Smokeless tobacco: Current User    Types: Snuff  Substance and Sexual Activity  . Alcohol use: Yes    Alcohol/week: 0.0 oz    Comment: RARE  . Drug use: No  . Sexual activity: Not on file  Other Topics Concern  . Not on file  Social History Narrative  . Not on file     Current Outpatient Medications:  .  calcium carbonate (CALCIUM 600) 600 MG TABS tablet, Take 600 mg by mouth daily., Disp: , Rfl:  .  cetirizine (ZYRTEC) 10 MG tablet, Take 10 mg by mouth daily., Disp: , Rfl:  .  Loperamide HCl (IMODIUM A-D PO), Take by mouth as needed., Disp: , Rfl:  .  Magnesium 400 MG CAPS, Take 1 capsule by mouth at bedtime as needed. , Disp: , Rfl:  .  Multiple Vitamin (MULTIVITAMIN) tablet, Take 1 tablet by mouth daily., Disp: , Rfl:  .  sodium chloride (OCEAN) 0.65 % SOLN nasal spray, Place 1 spray into both nostrils as needed for congestion., Disp: , Rfl:  .  timolol (BETIMOL) 0.5 % ophthalmic solution, Place 1 drop into the right eye daily., Disp: , Rfl:   Allergies  Allergen Reactions  . Sulfa Antibiotics Hives    Other reaction(s): Other (qualifier value)     Review of Systems  Respiratory: Negative for shortness of breath.   Musculoskeletal: Negative for myalgias.     Objective  Vitals:   02/20/17 1124  BP: (!) 158/96  Pulse: 89  Resp: 14  Temp: 98.3 F (36.8 C)  TempSrc: Oral  SpO2: 98%  Weight: 282 lb (127.9 kg)  Height: 6\' 3"  (1.905 m)    Physical Exam  Constitutional: He is oriented to person, place, and time and well-developed, well-nourished, and in no distress.  HENT:  Head: Normocephalic and atraumatic.  Cardiovascular: Normal rate,  regular rhythm and normal heart sounds.  No murmur heard. Pulmonary/Chest: Effort normal and breath sounds normal.  Abdominal: Soft. Bowel sounds are normal. There is no tenderness.  Musculoskeletal: He exhibits no edema.  Neurological: He is alert and oriented to person, place, and time.  Psychiatric: Mood, memory, affect and judgment normal.  Nursing note and vitals reviewed.       Assessment & Plan  1. Mixed hyperlipidemia Obtain FLP, consider starting on statin - Lipid panel  2. Elevated BP without diagnosis of hypertension Likely has whitecoat hypertension, advised to check blood pressure at home and bring logs for review in 2 weeks, may need to be started on antihypertensive if home blood pressure is elevated   [name] [name] Asad A[facility] 02/20/2017 11:34 AM

## 2017-03-01 ENCOUNTER — Encounter: Payer: 59 | Admitting: Family Medicine

## 2017-03-06 ENCOUNTER — Ambulatory Visit (INDEPENDENT_AMBULATORY_CARE_PROVIDER_SITE_OTHER): Payer: 59 | Admitting: Family Medicine

## 2017-03-06 ENCOUNTER — Encounter: Payer: Self-pay | Admitting: Family Medicine

## 2017-03-06 VITALS — BP 134/86 | HR 92 | Temp 98.0°F | Resp 14 | Ht 75.0 in | Wt 277.7 lb

## 2017-03-06 DIAGNOSIS — Z0000 Encounter for general adult medical examination without abnormal findings: Secondary | ICD-10-CM | POA: Diagnosis not present

## 2017-03-06 DIAGNOSIS — Z23 Encounter for immunization: Secondary | ICD-10-CM | POA: Diagnosis not present

## 2017-03-06 NOTE — Progress Notes (Signed)
Name: [name] [name]   MRN: [id number]    DOB: November 03, 1955   Date:03/06/2017       Progress Note  Subjective  Chief Complaint  Chief Complaint  Patient presents with  . Annual Exam    HPI  Pt. Presents for Complete Physical Exam. Pt. Has history of colon cancer s/p partial colectomy. He follows up with Oncology and has screenings based on Oncology recommendations. He will have labs for physical drawn at Oncology office in July 2019.    Past Medical History:  Diagnosis Date  . Anemia   . Cancer [facility])    rectal 2015, followed by Oncology, permanent colostomy  . Colostomy in place [facility])   . Kidney stones   . Seasonal allergies     Past Surgical History:  Procedure Laterality Date  . BACK SURGERY    . CHOLECYSTECTOMY    . COLON SURGERY    . KNEE SURGERY Bilateral   . PORT-A-CATH REMOVAL Right 12/02/2014   Procedure: REMOVAL PORT-A-CATH;  Surgeon: Marlyce Huge, MD;  Location: [facility] ORS;  Service: General;  Laterality: Right;  . PORTACATH PLACEMENT      Family History  Problem Relation Age of Onset  . Cancer Mother   . Hypertension Mother   . COPD Mother   . COPD Father   . Cancer Father        Prostate CA, on Lupron  . Stroke Maternal Grandmother   . Cancer Paternal Grandmother   . Heart attack Paternal Grandfather   . Cancer Paternal Grandfather     Social History   Socioeconomic History  . Marital status: Married    Spouse name: Not on file  . Number of children: Not on file  . Years of education: Not on file  . Highest education level: Not on file  Social Needs  . Financial resource strain: Not on file  . Food insecurity - worry: Not on file  . Food insecurity - inability: Not on file  . Transportation needs - medical: Not on file  . Transportation needs - non-medical: Not on file  Occupational History  . Not on file  Tobacco Use  . Smoking status: Never Smoker  . Smokeless tobacco: Current User    Types: Snuff  Substance and Sexual  Activity  . Alcohol use: Yes    Alcohol/week: 0.0 oz    Comment: RARE  . Drug use: No  . Sexual activity: Not on file  Other Topics Concern  . Not on file  Social History Narrative  . Not on file     Current Outpatient Medications:  .  calcium carbonate (CALCIUM 600) 600 MG TABS tablet, Take 600 mg by mouth daily., Disp: , Rfl:  .  cetirizine (ZYRTEC) 10 MG tablet, Take 10 mg by mouth daily., Disp: , Rfl:  .  Loperamide HCl (IMODIUM A-D PO), Take by mouth as needed., Disp: , Rfl:  .  Magnesium 400 MG CAPS, Take 1 capsule by mouth at bedtime as needed. , Disp: , Rfl:  .  Multiple Vitamin (MULTIVITAMIN) tablet, Take 1 tablet by mouth daily., Disp: , Rfl:  .  sodium chloride (OCEAN) 0.65 % SOLN nasal spray, Place 1 spray into both nostrils as needed for congestion., Disp: , Rfl:  .  timolol (BETIMOL) 0.5 % ophthalmic solution, Place 1 drop into the right eye daily., Disp: , Rfl:   Allergies  Allergen Reactions  . Sulfa Antibiotics Hives    Other reaction(s): Other (qualifier value)  Review of Systems  Constitutional: Positive for malaise/fatigue (gets tired easily, has always been active.). Negative for chills and fever.  HENT: Negative for congestion, ear pain, sinus pain and sore throat.   Eyes: Negative for blurred vision and double vision.  Respiratory: Negative for cough, sputum production, shortness of breath and wheezing.   Cardiovascular: Negative for chest pain, palpitations and leg swelling.  Gastrointestinal: Negative for abdominal pain, blood in stool, constipation, diarrhea, nausea and vomiting.  Genitourinary: Negative for dysuria and hematuria.  Musculoskeletal: Negative for back pain, joint pain and neck pain.  Skin: Negative for itching and rash.  Neurological: Negative for dizziness and headaches (gets light-headed at times when he overexerts. ).  Psychiatric/Behavioral: Negative for depression. The patient is not nervous/anxious and does not have insomnia.       Objective  Vitals:   03/06/17 1357  BP: 134/86  Pulse: 92  Resp: 14  Temp: 98 F (36.7 C)  TempSrc: Oral  SpO2: 95%  Weight: 277 lb 11.2 oz (126 kg)  Height: 6\' 3"  (1.905 m)    Physical Exam  Constitutional: He is oriented to person, place, and time and well-developed, well-nourished, and in no distress.  HENT:  Head: Normocephalic and atraumatic.  Right Ear: External ear normal.  Left Ear: External ear normal.  Mouth/Throat: Oropharynx is clear and moist.  Eyes: Conjunctivae are normal. Pupils are equal, round, and reactive to light.  Neck: Neck supple.  Cardiovascular: Normal rate, regular rhythm and normal heart sounds.  No murmur heard. Pulmonary/Chest: Effort normal and breath sounds normal. He has no wheezes.  Abdominal: Soft. Bowel sounds are normal. There is no tenderness.  Musculoskeletal: He exhibits no edema.  Neurological: He is alert and oriented to person, place, and time.  Psychiatric: Mood, memory, affect and judgment normal.  Nursing note and vitals reviewed.   Recent Results (from the past 2160 hour(s))  Lipid panel     Status: Abnormal   Collection Time: 02/20/17 11:54 AM  Result Value Ref Range   Cholesterol 185 <200 mg/dL   HDL 39 (L) >40 mg/dL   Triglycerides 176 (H) <150 mg/dL   LDL Cholesterol (Calc) 117 (H) mg/dL (calc)    Comment: Reference range: <100 . Desirable range <100 mg/dL for primary prevention;   <70 mg/dL for patients with CHD or diabetic patients  with > or = 2 CHD risk factors. [name] LDL-C is now calculated using the Martin-Hopkins  calculation, which is a validated novel method providing  better accuracy than the Friedewald equation in the  estimation of LDL-C.  Cresenciano Genre et al. Annamaria Helling. 1610;960(45): 2061-2068  (http://education.[employer].com/faq/FAQ164)    Total CHOL/HDL Ratio 4.7 <5.0 (calc)   Non-HDL Cholesterol (Calc) 146 (H) <130 mg/dL (calc)    Comment: For patients with diabetes plus 1 major ASCVD risk   factor, treating to a non-HDL-C goal of <100 mg/dL  (LDL-C of <70 mg/dL) is considered a therapeutic  option.      Assessment & Plan  1. Annual physical exam Labs to be completed by oncologist during the July 2019 appointment  2. Needs flu shot  - Flu Vaccine QUAD 6+ mos PF IM (Fluarix Quad PF)   Yehoshua Vitelli Asad A[facility] [place] 03/06/2017 2:18 PM

## 2017-05-09 ENCOUNTER — Other Ambulatory Visit: Payer: Self-pay

## 2017-05-09 ENCOUNTER — Inpatient Hospital Stay: Payer: 59 | Attending: Oncology

## 2017-05-09 DIAGNOSIS — C20 Malignant neoplasm of rectum: Secondary | ICD-10-CM

## 2017-05-09 DIAGNOSIS — Z85048 Personal history of other malignant neoplasm of rectum, rectosigmoid junction, and anus: Secondary | ICD-10-CM | POA: Insufficient documentation

## 2017-05-09 LAB — CBC WITH DIFFERENTIAL/PLATELET
Basophils Absolute: 0.1 10*3/uL (ref 0–0.1)
Basophils Relative: 2 %
EOS PCT: 4 %
Eosinophils Absolute: 0.2 10*3/uL (ref 0–0.7)
HEMATOCRIT: 40.4 % (ref 40.0–52.0)
Hemoglobin: 14.1 g/dL (ref 13.0–18.0)
LYMPHS ABS: 0.9 10*3/uL — AB (ref 1.0–3.6)
LYMPHS PCT: 20 %
MCH: 30.5 pg (ref 26.0–34.0)
MCHC: 34.8 g/dL (ref 32.0–36.0)
MCV: 87.6 fL (ref 80.0–100.0)
MONO ABS: 0.5 10*3/uL (ref 0.2–1.0)
Monocytes Relative: 11 %
NEUTROS ABS: 2.8 10*3/uL (ref 1.4–6.5)
Neutrophils Relative %: 63 %
PLATELETS: 345 10*3/uL (ref 150–440)
RBC: 4.61 MIL/uL (ref 4.40–5.90)
RDW: 14.3 % (ref 11.5–14.5)
WBC: 4.3 10*3/uL (ref 3.8–10.6)

## 2017-05-10 LAB — CEA: CEA: 2.7 ng/mL (ref 0.0–4.7)

## 2017-06-26 DIAGNOSIS — M5136 Other intervertebral disc degeneration, lumbar region: Secondary | ICD-10-CM | POA: Insufficient documentation

## 2017-06-26 DIAGNOSIS — M51369 Other intervertebral disc degeneration, lumbar region without mention of lumbar back pain or lower extremity pain: Secondary | ICD-10-CM | POA: Insufficient documentation

## 2017-07-23 ENCOUNTER — Other Ambulatory Visit: Payer: Self-pay

## 2017-07-23 ENCOUNTER — Encounter: Payer: Self-pay | Admitting: Radiation Oncology

## 2017-07-23 ENCOUNTER — Ambulatory Visit
Admission: RE | Admit: 2017-07-23 | Discharge: 2017-07-23 | Disposition: A | Discharge status: Home / Self Care | Payer: 59 | Source: Ambulatory Visit | Attending: Radiation Oncology | Admitting: Radiation Oncology

## 2017-07-23 VITALS — BP 156/95 | HR 82 | Temp 96.0°F | Resp 20 | Wt 276.5 lb

## 2017-07-23 DIAGNOSIS — C20 Malignant neoplasm of rectum: Secondary | ICD-10-CM

## 2017-07-23 DIAGNOSIS — Z933 Colostomy status: Secondary | ICD-10-CM | POA: Diagnosis not present

## 2017-07-23 DIAGNOSIS — Z923 Personal history of irradiation: Secondary | ICD-10-CM | POA: Insufficient documentation

## 2017-07-23 DIAGNOSIS — Z85048 Personal history of other malignant neoplasm of rectum, rectosigmoid junction, and anus: Secondary | ICD-10-CM | POA: Diagnosis not present

## 2017-07-23 NOTE — Progress Notes (Signed)
Radiation Oncology Follow up Note  Name: [name] [name]   Date:   07/23/2017 MRN:  [id number] DOB: 1955/05/12    This 62 y.o. male presents to the clinic today for for your follow-up status post neoadjuvant chemotherapy for rectal cancer.  REFERRING PROVIDER: Roselee Nova, MD  HPI: patient is a 62 year old male now seen out over 4 years having completed neoadjuvant chemoradiation therapy prior to surgical resection for a T2 rectal cancer status post AP resection. He also completed adjuvant FOLFOX chemotherapy. He is seen today in routine follow-up is doing well colostomy is functioning well he specifically denies any abdominal pain or discomfort..he has had a CT scan performed showing no evidence of disease.  COMPLICATIONS OF TREATMENT: none  FOLLOW UP COMPLIANCE: keeps appointments   PHYSICAL EXAM:  BP (!) 156/95   Pulse 82   Temp (!) 96 F (35.6 C)   Resp 20   Wt 276 lb 7.3 oz (125.4 kg)   BMI 34.55 kg/m  Colostomy is functioning well. No abdominal pain or discomfort on palpation positive bowel sounds in all 4 quadrants. Well-developed well-nourished patient in NAD. HEENT reveals PERLA, EOMI, discs not visualized.  Oral cavity is clear. No oral mucosal lesions are identified. Neck is clear without evidence of cervical or supraclavicular adenopathy. Lungs are clear to A&P. Cardiac examination is essentially unremarkable with regular rate and rhythm without murmur rub or thrill. Abdomen is benign with no organomegaly or masses noted. Motor sensory and DTR levels are equal and symmetric in the upper and lower extremities. Cranial nerves II through XII are grossly intact. Proprioception is intact. No peripheral adenopathy or edema is identified. No motor or sensory levels are noted. Crude visual fields are within normal range.  RADIOLOGY RESULTS: CT scan is reviewed and compatible above-stated findings  PLAN: at the present time patient is doing well with no evidence of disease. I'm  please was overall progress.I will discontinue follow-up care now that so out over 4 years. He continues close follow-up care with medical oncology. We'll be happy to reevaluate the patient any time should further treatment be indicated.  I would like to take this opportunity to thank you for allowing me to participate in the care of your patient.Noreene Filbert, MD

## 2017-07-31 DIAGNOSIS — H401111 Primary open-angle glaucoma, right eye, mild stage: Secondary | ICD-10-CM | POA: Diagnosis not present

## 2017-08-01 DIAGNOSIS — H524 Presbyopia: Secondary | ICD-10-CM | POA: Diagnosis not present

## 2017-08-15 ENCOUNTER — Encounter: Payer: Self-pay | Admitting: Family Medicine

## 2017-08-15 ENCOUNTER — Ambulatory Visit: Payer: Self-pay | Admitting: Family Medicine

## 2017-08-15 VITALS — BP 150/100 | HR 97 | Temp 98.4°F | Wt 275.0 lb

## 2017-08-15 DIAGNOSIS — L03031 Cellulitis of right toe: Secondary | ICD-10-CM

## 2017-08-15 DIAGNOSIS — M7989 Other specified soft tissue disorders: Secondary | ICD-10-CM

## 2017-08-15 MED ORDER — MUPIROCIN 2 % EX OINT: 1.0000 "application " | TOPICAL_OINTMENT | Freq: Two times a day (BID) | CUTANEOUS | 0 refills | Status: DC

## 2017-08-15 MED ORDER — DOXYCYCLINE HYCLATE 100 MG PO CAPS: 100.0000 mg | ORAL_CAPSULE | Freq: Two times a day (BID) | ORAL | 0 refills | Status: DC

## 2017-08-15 NOTE — Patient Instructions (Signed)
I have attached information which contact signs and symptoms of worsening infection.Apply Bactroban ointment to the affected area twice daily until would has completed healed. Take all medication as prescribed. Follow-up if conditions worsens or doesn't improve.    Paronychia Paronychia is an infection of the skin. It happens near a fingernail or toenail. It may cause pain and swelling around the nail. Usually, it is not serious and it clears up with treatment. Follow these instructions at home:  Soak the fingers or toes in warm water as told by your doctor. You may be told to do this for 20 minutes, 2-3 times a day.  Keep the area dry when you are not soaking it.  Take medicines only as told by your doctor.  If you were given an antibiotic medicine, finish all of it even if you start to feel better.  Keep the affected area clean.  Do not try to drain a fluid-filled bump yourself.  Wear rubber gloves when putting your hands in water.  Wear gloves if your hands might touch cleaners or chemicals.  Follow your doctor's instructions about: ? Wound care. ? Bandage (dressing) changes and removal. Contact a doctor if:  Your symptoms get worse or do not improve.  You have a fever or chills.  You have redness spreading from the affected area.  You have more fluid, blood, or pus coming from the affected area.  Your finger or knuckle is swollen or is hard to move. This information is not intended to replace advice given to you by your health care provider. Make sure you discuss any questions you have with your health care provider. Document Released: 12/28/2008 Document Revised: 06/17/2015 Document Reviewed: 12/17/2013 Elsevier Interactive Patient Education  [employer].

## 2017-08-15 NOTE — Progress Notes (Signed)
Patient ID: [name] [name], male    DOB: [birth year], 62 y.o.   MRN: [id number]  PCP: Roselee Nova, MD  Chief Complaint  Patient presents with  . Right great toe injury    Subjective:  HPI [name] [name] is a 62 y.o. male presents for evaluation of injury to right  great toe. great toe injury occurred x 4 days while playing with granddaughter. Something impacted the tip of his toe and he has subsequently developed swelling, small fissure at the cuticle of his great toe, and mild tenderness which is only present with touching the toe. Denies red streaking beyond the site of the wound, fever, chills, nausea, or vomiting.  Social History   Socioeconomic History  . Marital status: Married    Spouse name: Not on file  . Number of children: Not on file  . Years of education: Not on file  . Highest education level: Not on file  Occupational History  . Not on file  Social Needs  . Financial resource strain: Not on file  . Food insecurity:    Worry: Not on file    Inability: Not on file  . Transportation needs:    Medical: Not on file    Non-medical: Not on file  Tobacco Use  . Smoking status: Never Smoker  . Smokeless tobacco: Current User    Types: Snuff  Substance and Sexual Activity  . Alcohol use: Yes    Alcohol/week: 0.0 oz    Comment: RARE  . Drug use: No  . Sexual activity: Not on file  Lifestyle  . Physical activity:    Days per week: Not on file    Minutes per session: Not on file  . Stress: Not on file  Relationships  . Social connections:    Talks on phone: Not on file    Gets together: Not on file    Attends religious service: Not on file    Active member of club or organization: Not on file    Attends meetings of clubs or organizations: Not on file    Relationship status: Not on file  . Intimate partner violence:    Fear of current or ex partner: Not on file    Emotionally abused: Not on file    Physically abused: Not on file    Forced sexual activity:  Not on file  Other Topics Concern  . Not on file  Social History Narrative  . Not on file    Family History  Problem Relation Age of Onset  . Cancer Mother   . Hypertension Mother   . COPD Mother   . COPD Father   . Cancer Father        Prostate CA, on Lupron  . Stroke Maternal Grandmother   . Cancer Paternal Grandmother   . Heart attack Paternal Grandfather   . Cancer Paternal Grandfather    Review of Systems Pertinent negatives listed in HPI Patient Active Problem List   Diagnosis Date Noted  . Rectal cancer ([place]) 08/05/2014    Allergies  Allergen Reactions  . Sulfa Antibiotics Hives    Other reaction(s): Other (qualifier value)    Prior to Admission medications   Medication Sig Start Date End Date Taking? Authorizing Provider  calcium carbonate (CALCIUM 600) 600 MG TABS tablet Take 600 mg by mouth daily.   Yes [provider]  cetirizine (ZYRTEC) 10 MG tablet Take 10 mg by mouth daily.   Yes [provider]  Loperamide HCl (IMODIUM A-D PO) Take by mouth as needed.   Yes [provider]  Magnesium 400 MG CAPS Take 1 capsule by mouth at bedtime as needed.    Yes [provider]  meloxicam (MOBIC) 15 MG tablet  07/23/17  Yes [provider]  Multiple Vitamin (MULTIVITAMIN) tablet Take 1 tablet by mouth daily.   Yes [provider]  sodium chloride (OCEAN) 0.65 % SOLN nasal spray Place 1 spray into both nostrils as needed for congestion.   Yes [provider]  timolol (BETIMOL) 0.5 % ophthalmic solution Place 1 drop into the right eye daily.   Yes [provider]  methocarbamol (ROBAXIN) 500 MG tablet  06/26/17   [provider]    Past Medical, Surgical Family and Social History reviewed and updated.    Objective:   Today's Vitals   08/15/17 1154  BP: (!) 150/100  Pulse: 97  Temp: 98.4 F (36.9 C)  SpO2: 98%  Weight: 275 lb (124.7 kg)    Wt Readings from Last 3 Encounters:    08/15/17 275 lb (124.7 kg)  07/23/17 276 lb 7.3 oz (125.4 kg)  03/06/17 277 lb 11.2 oz (126 kg)   Physical Exam  Constitutional: He is oriented to person, place, and time. He appears well-developed and well-nourished.  Cardiovascular:  Pulses:      Dorsalis pedis pulses are 2+ on the right side.  Musculoskeletal:       Feet:  Neurological: He is alert and oriented to person, place, and time.  Skin:  See documentation within picture   Assessment & Plan:  1. Paronychia of great toe of right foot 2. Toe swelling  Patient has normal neurovascular findings of right great toe and foot. Advised that I will treat today with antibiotic, however "RED FLAGS" discussed along with indication for immediate follow-up. Patient verbalized understanding.   Meds ordered this encounter  Medications  . doxycycline (VIBRAMYCIN) 100 MG capsule    Sig: Take 1 capsule (100 mg total) by mouth 2 (two) times daily.    Dispense:  20 capsule    Refill:  0  . mupirocin ointment (BACTROBAN) 2 %    Sig: Apply 1 application topically 2 (two) times daily.    Dispense:  30 g    Refill:  0     If symptoms worsen or do not improve, return for follow-up, follow-up with PCP, or at the emergency department if severity of symptoms warrant a higher level of care.     Carroll Sage. Kenton Kingfisher, MSN, FNP-C [facility]  [place],  [phone]

## 2017-09-19 ENCOUNTER — Telehealth: Payer: Self-pay

## 2017-09-19 NOTE — Telephone Encounter (Signed)
Left detailed voicemail

## 2017-09-19 NOTE — Telephone Encounter (Signed)
Copied from [place] [id number]. Topic: Referral - Request >> Sep 18, 2017  3:06 PM Valla Leaver wrote: Reason for CRM: Patient requesting referral to urology but has not been seen since visit 03/06/2017 cpe with Dr. Manuella Ghazi. Patient needs a call back to know if the referral will be placed.

## 2017-09-19 NOTE — Telephone Encounter (Signed)
Unfortunately I do not see a referral order from February, and his previous provider is no longer here. We would love to see him here if he can make an appointment so we can get him the appropriate help he needs.

## 2017-09-21 ENCOUNTER — Ambulatory Visit (INDEPENDENT_AMBULATORY_CARE_PROVIDER_SITE_OTHER): Payer: 59 | Admitting: Family Medicine

## 2017-09-21 ENCOUNTER — Encounter: Payer: Self-pay | Admitting: Family Medicine

## 2017-09-21 VITALS — BP 130/80 | HR 84 | Temp 98.2°F | Resp 18 | Ht 75.0 in | Wt 279.0 lb

## 2017-09-21 DIAGNOSIS — Z933 Colostomy status: Secondary | ICD-10-CM

## 2017-09-21 DIAGNOSIS — Z85048 Personal history of other malignant neoplasm of rectum, rectosigmoid junction, and anus: Secondary | ICD-10-CM | POA: Diagnosis not present

## 2017-09-21 DIAGNOSIS — E6609 Other obesity due to excess calories: Secondary | ICD-10-CM

## 2017-09-21 DIAGNOSIS — E291 Testicular hypofunction: Secondary | ICD-10-CM | POA: Insufficient documentation

## 2017-09-21 DIAGNOSIS — R7989 Other specified abnormal findings of blood chemistry: Secondary | ICD-10-CM

## 2017-09-21 DIAGNOSIS — Z6834 Body mass index (BMI) 34.0-34.9, adult: Secondary | ICD-10-CM

## 2017-09-21 DIAGNOSIS — J302 Other seasonal allergic rhinitis: Secondary | ICD-10-CM

## 2017-09-21 DIAGNOSIS — H5461 Unqualified visual loss, right eye, normal vision left eye: Secondary | ICD-10-CM | POA: Diagnosis not present

## 2017-09-21 DIAGNOSIS — Z13220 Encounter for screening for lipoid disorders: Secondary | ICD-10-CM

## 2017-09-21 DIAGNOSIS — M5136 Other intervertebral disc degeneration, lumbar region: Secondary | ICD-10-CM | POA: Diagnosis not present

## 2017-09-21 DIAGNOSIS — Z23 Encounter for immunization: Secondary | ICD-10-CM

## 2017-09-21 NOTE — Progress Notes (Signed)
Name: [name] [name]   MRN: [id number]    DOB: 07-04-1955   Date:09/21/2017       Progress Note  Subjective  Chief Complaint  Chief Complaint  Patient presents with  . Referral    to Urologist    HPI  Pt presents to transfer care from previous PCP Dr. Manuella Ghazi.  He comes today to request referral to urology for testosterone management - he was on androgel in the past, saw a rise in his PSA, and stopped the androgel.  Soon after her developed rectal cancer and went through treatment for this, so he never followed up to re-start the androgel. Pt has history low testosterone and rectal cancer. He endorses low energy, does sugger from ED but denies low libido.  Rectal Cancer History: He is following up with Dr. Grayland Ormond every 6 mos; has LEFT sided colostomy present and states this is doing well.  Allergic Rhinitis: Taking Zyrtec daily; using nasal spray - will go between afrin exrtra moisturizing and ocean spray.   History of RIGHT eye vision deficit - he uses timolol, but never diagnosed with glaucoma, but does have vision decreased and some color loss (yellow, some greens).  Lumbar Back pain and history of back surgery - he takes meloxicam as needed; is no longer followed regularly by spin specialist.   Tobacco Use: He does use chewing tobacco during baseball coaching - uses it about every day  Obesity: Body mass index is 34.87 kg/m. Weight Management History: Diet: Fluctuates - eats red meat, chicken, salads, 2 beers a week, not much sweets. Exercise: moderately active Co-Morbid Conditions: dyslipidemias; 2 or more of these conditions combined with BMI >30 is considered morbid obesity; is this diagnosis appropriate and/or added to patient's problem list? No  Patient Active Problem List   Diagnosis Date Noted  . Rectal cancer ([place]) 08/05/2014    Past Surgical History:  Procedure Laterality Date  . BACK SURGERY    . CHOLECYSTECTOMY    . COLON SURGERY    . KNEE SURGERY  Bilateral   . PORT-A-CATH REMOVAL Right 12/02/2014   Procedure: REMOVAL PORT-A-CATH;  Surgeon: Marlyce Huge, MD;  Location: [facility] ORS;  Service: General;  Laterality: Right;  . PORTACATH PLACEMENT      Family History  Problem Relation Age of Onset  . Cancer Mother   . Hypertension Mother   . COPD Mother   . COPD Father   . Cancer Father        Prostate CA, on Lupron  . Stroke Maternal Grandmother   . Cancer Paternal Grandmother   . Heart attack Paternal Grandfather   . Cancer Paternal Grandfather     Social History   Socioeconomic History  . Marital status: Married    Spouse name: Not on file  . Number of children: Not on file  . Years of education: Not on file  . Highest education level: Not on file  Occupational History  . Not on file  Social Needs  . Financial resource strain: Not on file  . Food insecurity:    Worry: Not on file    Inability: Not on file  . Transportation needs:    Medical: Not on file    Non-medical: Not on file  Tobacco Use  . Smoking status: Never Smoker  . Smokeless tobacco: Current User    Types: Snuff  Substance and Sexual Activity  . Alcohol use: Yes    Alcohol/week: 0.0 standard drinks    Comment: RARE  .  Drug use: No  . Sexual activity: Not on file  Lifestyle  . Physical activity:    Days per week: Not on file    Minutes per session: Not on file  . Stress: Not on file  Relationships  . Social connections:    Talks on phone: Not on file    Gets together: Not on file    Attends religious service: Not on file    Active member of club or organization: Not on file    Attends meetings of clubs or organizations: Not on file    Relationship status: Not on file  . Intimate partner violence:    Fear of current or ex partner: Not on file    Emotionally abused: Not on file    Physically abused: Not on file    Forced sexual activity: Not on file  Other Topics Concern  . Not on file  Social History Narrative  . Not on file      Current Outpatient Medications:  .  cetirizine (ZYRTEC) 10 MG tablet, Take 10 mg by mouth daily., Disp: , Rfl:  .  Loperamide HCl (IMODIUM A-D PO), Take by mouth as needed., Disp: , Rfl:  .  Magnesium 400 MG CAPS, Take 1 capsule by mouth at bedtime as needed. , Disp: , Rfl:  .  meloxicam (MOBIC) 15 MG tablet, , Disp: , Rfl:  .  Multiple Vitamin (MULTIVITAMIN) tablet, Take 1 tablet by mouth daily., Disp: , Rfl:  .  sodium chloride (OCEAN) 0.65 % SOLN nasal spray, Place 1 spray into both nostrils as needed for congestion., Disp: , Rfl:  .  timolol (BETIMOL) 0.5 % ophthalmic solution, Place 1 drop into the right eye daily., Disp: , Rfl:  .  calcium carbonate (CALCIUM 600) 600 MG TABS tablet, Take 600 mg by mouth daily., Disp: , Rfl:  .  doxycycline (VIBRAMYCIN) 100 MG capsule, Take 1 capsule (100 mg total) by mouth 2 (two) times daily. (Patient not taking: Reported on 09/21/2017), Disp: 20 capsule, Rfl: 0 .  methocarbamol (ROBAXIN) 500 MG tablet, , Disp: , Rfl: 0 .  mupirocin ointment (BACTROBAN) 2 %, Apply 1 application topically 2 (two) times daily. (Patient not taking: Reported on 09/21/2017), Disp: 30 g, Rfl: 0  Allergies  Allergen Reactions  . Sulfa Antibiotics Hives    Other reaction(s): Other (qualifier value)    ROS Constitutional: Negative for fever or weight change.  Respiratory: Negative for cough and shortness of breath.   Cardiovascular: Negative for chest pain or palpitations.  Gastrointestinal: Negative for abdominal pain, no bowel changes.  Musculoskeletal: Negative for gait problem or joint swelling.  Skin: Negative for rash.  Neurological: Negative for dizziness or headache.  No other specific complaints in a complete review of systems (except as listed in HPI above).  Objective  Vitals:   09/21/17 0844  BP: 130/80  Pulse: 84  Resp: 18  Temp: 98.2 F (36.8 C)  TempSrc: Oral  SpO2: 96%  Weight: 279 lb (126.6 kg)  Height: 6\' 3"  (1.905 m)   Body mass index  is 34.87 kg/m.  Physical Exam Constitutional: Patient appears well-developed and well-nourished. No distress.  HENT: Head: Normocephalic and atraumatic. Mouth/Throat: Oropharynx is clear and moist. No oropharyngeal exudate.  Eyes: Conjunctivae and EOM are normal. Pupils are equal, round, and reactive to light. No scleral icterus.  Neck: Normal range of motion. Neck supple. No JVD present. No thyromegaly present.  Cardiovascular: Normal rate, regular rhythm and normal heart sounds.  No  murmur heard. No BLE edema. Pulmonary/Chest: Effort normal and breath sounds normal. No respiratory distress. Abdominal: Soft. Bowel sounds are normal, no distension. There is no tenderness. no masses Musculoskeletal: Normal range of motion, no joint effusions. No gross deformities Neurological: he is alert and oriented to person, place, and time. No cranial nerve deficit. Coordination, balance, strength, speech and gait are normal.  Skin: Skin is warm and dry. No rash noted. No erythema.  Psychiatric: Patient has a normal mood and affect. behavior is normal. Judgment and thought content normal.  No results found for this or any previous visit (from the past 72 hour(s)).  PHQ2/9: Depression screen [facility] 2/9 09/21/2017 07/23/2017 03/06/2017 02/20/2017 10/11/2016  Decreased Interest 0 0 0 0 0  Down, Depressed, Hopeless 0 0 0 0 0  PHQ - 2 Score 0 0 0 0 0  Altered sleeping 0 - - - -  Tired, decreased energy 0 - - - -  Change in appetite 0 - - - -  Feeling bad or failure about yourself  0 - - - -  Trouble concentrating 0 - - - -  Moving slowly or fidgety/restless 0 - - - -  Suicidal thoughts 0 - - - -  PHQ-9 Score 0 - - - -  Difficult doing work/chores Not difficult at all - - - -     Fall Risk: Fall Risk  09/21/2017 07/23/2017 03/06/2017 02/20/2017 10/11/2016  Falls in the past year? No No No No No   Assessment & Plan  1. Low testosterone in male - Ambulatory referral to Urology  2. Class 1 obesity due to  excess calories without serious comorbidity with body mass index (BMI) of 34.0 to 34.9 in adult - Lipid panel - COMPLETE METABOLIC PANEL WITH GFR - Discussed importance of 150 minutes of physical activity weekly, eat two servings of fish weekly, eat one serving of tree nuts ( cashews, pistachios, pecans, almonds.[name]) every other day, eat 6 servings of fruit/vegetables daily and drink plenty of water and avoid sweet beverages.   3. Need for influenza vaccination - Flu Vaccine QUAD 6+ mos PF IM (Fluarix Quad PF)  4. Colostomy in place [facility]) - Stable  5. History of rectal cancer - Continue follow up with Dr. Grayland Ormond  6. Decreased vision of right eye - Continue follow up with Opthalmology  7. Degeneration of lumbar intervertebral disc - Stable, may continue PRN Meloxicam  8. Seasonal allergies - Continue Zyrtec daily  9. Lipid screening - Lipid panel

## 2017-10-25 ENCOUNTER — Ambulatory Visit: Payer: 59 | Admitting: Urology

## 2017-11-04 NOTE — Progress Notes (Signed)
[place]  [phone] [phone]  ID: [name] [name] OB: 12-Mar-1955  MR#: [id number]  GBT#:[id number]  Patient Care Team: Roselee Nova, MD as PCP - General (Family Medicine) Marlyce Huge, MD as Attending Physician (Surgery)  CHIEF COMPLAINT: Pathologic stage I rectal adenocarcinoma at the anal verge.  INTERVAL HISTORY: Patient returns to clinic today for routine yearly evaluation.  He continues to feel well and remains asymptomatic.  He has no issues with his colostomy.  He has no neurologic complaints.  He denies any recent fevers or illnesses.  He denies any chest pain or shortness of breath.  He denies nausea, vomiting, constipation, or diarrhea. He has noted no blood or melena in his colostomy bag.  He has no urinary complaints.  Patient feels at his baseline offers no specific complaints today.  REVIEW OF SYSTEMS:   Review of Systems  Constitutional: Negative.  Negative for fever, malaise/fatigue and weight loss.  Respiratory: Negative.  Negative for cough and shortness of breath.   Cardiovascular: Negative.  Negative for chest pain and leg swelling.  Gastrointestinal: Negative.  Negative for abdominal pain, blood in stool, constipation, diarrhea and melena.  Genitourinary: Negative.  Negative for dysuria.  Musculoskeletal: Negative.  Negative for back pain.  Skin: Negative.  Negative for rash.  Neurological: Negative.  Negative for focal weakness, weakness and headaches.  Psychiatric/Behavioral: Negative.  The patient is not nervous/anxious.     As per HPI. Otherwise, a complete review of systems is negative.  PAST MEDICAL HISTORY: Past Medical History:  Diagnosis Date  . Anemia   . Cancer [facility])    rectal 2015, followed by Oncology, permanent colostomy  . Colostomy in place [facility])   . Kidney stones   . Seasonal allergies     PAST SURGICAL HISTORY: Past Surgical History:  Procedure Laterality Date  . BACK SURGERY     . CHOLECYSTECTOMY    . COLON SURGERY    . KNEE SURGERY Bilateral   . PORT-A-CATH REMOVAL Right 12/02/2014   Procedure: REMOVAL PORT-A-CATH;  Surgeon: Marlyce Huge, MD;  Location: [facility] ORS;  Service: General;  Laterality: Right;  . PORTACATH PLACEMENT      FAMILY HISTORY: Father with prostate cancer.  COPD, CHF.     ADVANCED DIRECTIVES:    HEALTH MAINTENANCE: Social History   Tobacco Use  . Smoking status: Never Smoker  . Smokeless tobacco: Current User    Types: Snuff  Substance Use Topics  . Alcohol use: Yes    Alcohol/week: 0.0 standard drinks    Comment: RARE  . Drug use: No     Colonoscopy:  PAP:  Bone density:  Lipid panel:  Allergies  Allergen Reactions  . Sulfa Antibiotics Hives    Other reaction(s): Other (qualifier value)    Current Outpatient Medications  Medication Sig Dispense Refill  . cetirizine (ZYRTEC) 10 MG tablet Take 10 mg by mouth daily.    [name] glucosamine-chondroitin 500-400 MG tablet Take 1 tablet by mouth 3 (three) times daily.    . Magnesium 400 MG CAPS Take 1 capsule by mouth at bedtime as needed.     . Multiple Vitamin (MULTIVITAMIN) tablet Take 1 tablet by mouth daily.    . timolol (BETIMOL) 0.5 % ophthalmic solution Place 1 drop into the right eye daily.    . Loperamide HCl (IMODIUM A-D PO) Take by mouth as needed.    . meloxicam (MOBIC) 15 MG tablet     . sodium chloride (OCEAN) 0.65 %  SOLN nasal spray Place 1 spray into both nostrils as needed for congestion.     No current facility-administered medications for this visit.     OBJECTIVE: Vitals:   11/07/17 1126  BP: (!) 169/92  Pulse: 73  Resp: 18  Temp: (!) 95.7 F (35.4 C)     Body mass index is 35.17 kg/m.    ECOG FS:0 - Asymptomatic  General: Well-developed, well-nourished, no acute distress. Eyes: Pink conjunctiva, anicteric sclera. HEENT: Normocephalic, moist mucous membranes. Lungs: Clear to auscultation bilaterally. Heart: Regular rate and rhythm. No  rubs, murmurs, or gallops. Abdomen: Soft, nontender, nondistended. No organomegaly noted, normoactive bowel sounds.  Colostomy noted. Musculoskeletal: No edema, cyanosis, or clubbing. Neuro: Alert, answering all questions appropriately. Cranial nerves grossly intact. Skin: No rashes or petechiae noted. Psych: Normal affect.   LAB RESULTS:  Lab Results  Component Value Date   NA 141 07/03/2016   K 4.9 07/03/2016   CL 104 07/03/2016   CO2 27 07/03/2016   GLUCOSE 100 (H) 07/03/2016   BUN 16 07/03/2016   CREATININE 0.99 07/03/2016   CALCIUM 9.5 07/03/2016   PROT 6.7 07/03/2016   ALBUMIN 3.9 07/03/2016   AST 14 07/03/2016   ALT 12 07/03/2016   ALKPHOS 58 07/03/2016   BILITOT 0.5 07/03/2016   GFRNONAA 82 07/03/2016   GFRAA >89 07/03/2016    Lab Results  Component Value Date   WBC 3.9 (L) 11/07/2017   NEUTROABS 2.6 11/07/2017   HGB 13.8 11/07/2017   HCT 42.2 11/07/2017   MCV 89.2 11/07/2017   PLT 285 11/07/2017     STUDIES: No results found.  ASSESSMENT: Pathologic stage I rectal adenocarcinoma at the anal verge. (previously clinical stage IIIa)   PLAN:    1.  Pathologic stage I rectal adenocarcinoma at the anal verge: Patient completed neoadjuvant 5-FU and XRT followed by surgical excision on October 08, 2013. Pathologic stage as above. Previously patient declined adjuvant FOLFOX therapy. He expressed understanding of the increased risk of recurrence by foregoing adjuvant chemotherapy. CT scan results from October 29, 2015 reviewed independently with no evidence of recurrence.  No further imaging is necessary.  CEA has trended up slightly to 5.4.  Patient continues to delay his colonoscopy, but commits to doing it within the next several months.  Return to clinic in 1 year for further evaluation.  Patient will be 5 years removed from completing his treatments and possibly could be discharged from clinic at that time.    2. Colostomy: Continue stoma care as indicated. 3.   Leukopenia: Mild, monitor.  I spent a total of 20 minutes face-to-face with the patient of which greater than 50% of the visit was spent in counseling and coordination of care as detailed above.   Patient expressed understanding and was in agreement with this plan. He also understands that He can call clinic at any time with any questions, concerns, or complaints.    Lloyd Huger, MD   11/08/2017 5:08 PM

## 2017-11-06 ENCOUNTER — Other Ambulatory Visit: Payer: Self-pay | Admitting: *Deleted

## 2017-11-06 DIAGNOSIS — C20 Malignant neoplasm of rectum: Secondary | ICD-10-CM

## 2017-11-07 ENCOUNTER — Inpatient Hospital Stay (HOSPITAL_BASED_OUTPATIENT_CLINIC_OR_DEPARTMENT_OTHER): Payer: 59 | Admitting: Oncology

## 2017-11-07 ENCOUNTER — Inpatient Hospital Stay: Payer: 59 | Attending: Oncology

## 2017-11-07 ENCOUNTER — Other Ambulatory Visit: Payer: Self-pay

## 2017-11-07 VITALS — BP 169/92 | HR 73 | Temp 95.7°F | Resp 18 | Wt 281.4 lb

## 2017-11-07 DIAGNOSIS — C20 Malignant neoplasm of rectum: Secondary | ICD-10-CM

## 2017-11-07 DIAGNOSIS — D72819 Decreased white blood cell count, unspecified: Secondary | ICD-10-CM

## 2017-11-07 DIAGNOSIS — Z85048 Personal history of other malignant neoplasm of rectum, rectosigmoid junction, and anus: Secondary | ICD-10-CM | POA: Insufficient documentation

## 2017-11-07 LAB — CBC WITH DIFFERENTIAL/PLATELET
Abs Immature Granulocytes: 0.02 10*3/uL (ref 0.00–0.07)
Basophils Absolute: 0.1 10*3/uL (ref 0.0–0.1)
Basophils Relative: 1 %
EOS ABS: 0.2 10*3/uL (ref 0.0–0.5)
Eosinophils Relative: 4 %
HCT: 42.2 % (ref 39.0–52.0)
Hemoglobin: 13.8 g/dL (ref 13.0–17.0)
IMMATURE GRANULOCYTES: 1 %
LYMPHS ABS: 0.7 10*3/uL (ref 0.7–4.0)
Lymphocytes Relative: 17 %
MCH: 29.2 pg (ref 26.0–34.0)
MCHC: 32.7 g/dL (ref 30.0–36.0)
MCV: 89.2 fL (ref 80.0–100.0)
MONO ABS: 0.4 10*3/uL (ref 0.1–1.0)
MONOS PCT: 9 %
NEUTROS PCT: 68 %
Neutro Abs: 2.6 10*3/uL (ref 1.7–7.7)
Platelets: 285 10*3/uL (ref 150–400)
RBC: 4.73 MIL/uL (ref 4.22–5.81)
RDW: 13.4 % (ref 11.5–15.5)
WBC: 3.9 10*3/uL — ABNORMAL LOW (ref 4.0–10.5)
nRBC: 0 % (ref 0.0–0.2)

## 2017-11-07 NOTE — Progress Notes (Signed)
Pt stated doing well-no bowel issues on going. Feeling good.

## 2017-11-08 LAB — CEA: CEA1: 5.4 ng/mL — AB (ref 0.0–4.7)

## 2017-11-14 ENCOUNTER — Encounter: Payer: Self-pay | Admitting: Oncology

## 2017-12-11 ENCOUNTER — Encounter: Payer: Self-pay | Admitting: Urology

## 2017-12-11 ENCOUNTER — Ambulatory Visit (INDEPENDENT_AMBULATORY_CARE_PROVIDER_SITE_OTHER): Payer: 59 | Admitting: Urology

## 2017-12-11 VITALS — BP 162/99 | HR 94 | Ht 75.0 in | Wt 281.3 lb

## 2017-12-11 DIAGNOSIS — N521 Erectile dysfunction due to diseases classified elsewhere: Secondary | ICD-10-CM | POA: Diagnosis not present

## 2017-12-11 DIAGNOSIS — E291 Testicular hypofunction: Secondary | ICD-10-CM

## 2017-12-11 NOTE — Progress Notes (Signed)
12/11/2017 10:39 PM   Barbara Cower Lichtenberg [birth year] [id number]  Referring provider: Roselee Nova, MD [address] [place], [place] [address]  Chief Complaint  Patient presents with  . Hypogonadism    HPI: 62 year old male seen in consultation at the request of Raelyn Ensign for evaluation of hypogonadism.  He states he was initially diagnosed with hypogonadism approximately 7 years ago and was started on AndroGel by Dr. Lucita Lora.  He utilize this medication for approximately 1.5 years then he states it was discontinued because he had a slight bump in his PSA.  His symptoms include low energy, fatigue and central weight gain.  He has a normal libido.  He did not feel his symptoms significantly improved while he was using AndroGel.  He was diagnosed with rectal cancer in 2015 and underwent an APR and subsequent radiation/chemotherapy.  He developed erectile dysfunction.  He currently has partial erections which are usually firm enough for penetration however he has difficulty maintaining the erection.  He has had no treatment for this problem.  Prior to onset of his ED he also noted dorsal curvature of the mid shaft of the penis with erections that he estimated approximately 45 degrees.  He states this is much less prominent now.  He denies pain with erections.  Last PSA was in June 2018 and was 1.7.  PMH: Past Medical History:  Diagnosis Date  . Anemia   . Cancer [facility])    rectal 2015, followed by Oncology, permanent colostomy  . Colostomy in place [facility])   . Kidney stones   . Seasonal allergies     Surgical History: Past Surgical History:  Procedure Laterality Date  . BACK SURGERY    . CHOLECYSTECTOMY    . COLON SURGERY    . KNEE SURGERY Bilateral   . PORT-A-CATH REMOVAL Right 12/02/2014   Procedure: REMOVAL PORT-A-CATH;  Surgeon: Marlyce Huge, MD;  Location: [facility] ORS;  Service: General;  Laterality: Right;  . PORTACATH PLACEMENT      Home  Medications:  Allergies as of 12/11/2017      Reactions   Sulfa Antibiotics Hives   Other reaction(s): Other (qualifier value)      Medication List        Accurate as of 12/11/17 10:39 PM. Always use your most recent med list.          cetirizine 10 MG tablet Commonly known as:  ZYRTEC Take 10 mg by mouth daily.   glucosamine-chondroitin 500-400 MG tablet Take 1 tablet by mouth 3 (three) times daily.   IMODIUM A-D PO Take by mouth as needed.   Magnesium 400 MG Caps Take 1 capsule by mouth at bedtime as needed.   meloxicam 15 MG tablet Commonly known as:  MOBIC   multivitamin tablet Take 1 tablet by mouth daily.   sodium chloride 0.65 % Soln nasal spray Commonly known as:  OCEAN Place 1 spray into both nostrils as needed for congestion.   timolol 0.5 % ophthalmic solution Commonly known as:  BETIMOL Place 1 drop into the right eye daily.       Allergies:  Allergies  Allergen Reactions  . Sulfa Antibiotics Hives    Other reaction(s): Other (qualifier value)    Family History: Family History  Problem Relation Age of Onset  . Cancer Mother   . Hypertension Mother   . COPD Mother   . COPD Father   . Cancer Father        Prostate CA,  on Lupron  . Stroke Maternal Grandmother   . Cancer Paternal Grandmother   . Heart attack Paternal Grandfather   . Cancer Paternal Grandfather     Social History:  reports that he has never smoked. His smokeless tobacco use includes snuff. He reports that he drinks alcohol. He reports that he does not use drugs.  ROS: UROLOGY Frequent Urination?: No Hard to postpone urination?: Yes Burning/pain with urination?: No Get up at night to urinate?: Yes Leakage of urine?: Yes Urine stream starts and stops?: No Trouble starting stream?: No Do you have to strain to urinate?: No Blood in urine?: No Urinary tract infection?: No Sexually transmitted disease?: No Injury to kidneys or bladder?: No Painful intercourse?:  No Weak stream?: No Erection problems?: Yes Penile pain?: No  Gastrointestinal Nausea?: No Vomiting?: No Indigestion/heartburn?: No Diarrhea?: Yes Constipation?: No  Constitutional Fever: No Night sweats?: No Weight loss?: No Fatigue?: Yes  Skin Skin rash/lesions?: No Itching?: No  Eyes Blurred vision?: No Double vision?: No  Ears/Nose/Throat Sore throat?: No Sinus problems?: No  Hematologic/Lymphatic Swollen glands?: No Easy bruising?: No  Cardiovascular Leg swelling?: No Chest pain?: No  Respiratory Cough?: No Shortness of breath?: No  Endocrine Excessive thirst?: No  Musculoskeletal Back pain?: Yes Joint pain?: Yes  Neurological Headaches?: No Dizziness?: No  Psychologic Depression?: No Anxiety?: No  Physical Exam: BP (!) 162/99 (BP Location: Left Arm, Patient Position: Sitting, Cuff Size: Large)   Pulse 94   Ht 6\' 3"  (1.905 m)   Wt 281 lb 4.8 oz (127.6 kg)   BMI 35.16 kg/m   Constitutional:  Alert and oriented, No acute distress. HEENT: [place] AT, moist mucus membranes.  Trachea midline, no masses. Cardiovascular: No clubbing, cyanosis, or edema. Respiratory: Normal respiratory effort, no increased work of breathing. GI: Abdomen is soft, nontender, nondistended, no abdominal masses GU: No CVA tenderness.  Phallus is circumcised.  There is an elongated dorsal plaque extending from the proximal to mid shaft.  Testes descended bilaterally with estimated volume of 15 cc.  No masses or tenderness.  Unable to examine prostate secondary to APR. Lymph: No cervical or inguinal lymphadenopathy. Skin: No rashes, bruises or suspicious lesions. Neurologic: Grossly intact, no focal deficits, moving all 4 extremities. Psychiatric: Normal mood and affect.  Laboratory Data:  Lab Results  Component Value Date   PSA 1.7 07/03/2016    Assessment & Plan:   62 year old male with a history of hypogonadism previously on TRT.  He presently has recurrent  symptoms.  No recent hormonal testing.  I ordered an a.m. testosterone, LH and prolactin.  If appropriate he desires to restart TRT.  Since he did not note significant improvement in his symptoms on topicals he is more interested in injections.  Potential side effects of testosterone replacement were discussed including stimulation of benign prostatic growth with lower urinary tract symptoms; erythrocytosis; edema; gynecomastia; worsening sleep apnea; venous thromboembolism; testicular atrophy and infertility. Recent studies suggesting an increased incidence of heart attack and stroke in patients taking testosterone was discussed. He was informed there is conflicting evidence regarding the impact of testosterone therapy on cardiovascular risk. The theoretical risk of growth stimulation of an undetected prostate cancer was also discussed.  He was informed that current evidence does not provide any definitive answers regarding the risks of testosterone therapy on prostate cancer and cardiovascular disease. The need for periodic monitoring of his testosterone level, PSA, hematocrit was discussed.  I also discussed PDE 5 therapy for his ED.  He wanted  to discuss with his wife before starting.  Abbie Sons, [facility] [address], [place], [place] [address] [phone]

## 2017-12-13 ENCOUNTER — Other Ambulatory Visit: Payer: 59

## 2017-12-13 ENCOUNTER — Telehealth: Payer: Self-pay | Admitting: Urology

## 2017-12-13 DIAGNOSIS — E291 Testicular hypofunction: Secondary | ICD-10-CM | POA: Diagnosis not present

## 2017-12-13 NOTE — Telephone Encounter (Signed)
Pt spoke w/[facility] at last appt is interested in getting an RX for generic Cialis sent to [facility].  Please call pt [phone] and let him know when this gets sent in.

## 2017-12-14 ENCOUNTER — Telehealth: Payer: Self-pay

## 2017-12-14 LAB — LUTEINIZING HORMONE: LH: 4.2 m[IU]/mL (ref 1.7–8.6)

## 2017-12-14 LAB — PSA: PROSTATE SPECIFIC AG, SERUM: 2.5 ng/mL (ref 0.0–4.0)

## 2017-12-14 LAB — TESTOSTERONE: Testosterone: 152 ng/dL — ABNORMAL LOW (ref 264–916)

## 2017-12-14 LAB — PROLACTIN: Prolactin: 14.8 ng/mL (ref 4.0–15.2)

## 2017-12-14 MED ORDER — TADALAFIL 20 MG PO TABS: 20.0000 mg | ORAL_TABLET | Freq: Every day | ORAL | 10 refills | Status: DC | PRN

## 2017-12-14 NOTE — Telephone Encounter (Signed)
-----   Message from Abbie Sons, MD sent at 12/14/2017 11:00 AM EST ----- Prolactin and LH were normal.  We discussed testosterone replacement and he was more interested in injections.  If he desires to proceed will send Rx to pharmacy.

## 2017-12-14 NOTE — Telephone Encounter (Signed)
Script sent  

## 2017-12-17 ENCOUNTER — Other Ambulatory Visit: Payer: Self-pay

## 2017-12-25 ENCOUNTER — Encounter: Payer: Self-pay | Admitting: Urology

## 2017-12-27 ENCOUNTER — Other Ambulatory Visit: Payer: Self-pay | Admitting: Urology

## 2017-12-27 ENCOUNTER — Telehealth: Payer: Self-pay

## 2017-12-27 DIAGNOSIS — E291 Testicular hypofunction: Secondary | ICD-10-CM

## 2017-12-27 MED ORDER — TESTOSTERONE CYPIONATE 200 MG/ML IM SOLN: 200.0000 mg | INTRAMUSCULAR | 0 refills | Status: DC

## 2017-12-27 NOTE — Telephone Encounter (Signed)
Prior authorization done for testosterone solution done via cover my meds.  Waiting response.

## 2017-12-27 NOTE — Progress Notes (Signed)
Rx testosterone was sent.  He needs an appointment for either injection or injection training and after starting therapy a 6-week follow-up with me with a testosterone level prior to that visit.

## 2017-12-31 NOTE — Telephone Encounter (Signed)
Left message for patient to call back to schedule an app   Sharyn Lull

## 2018-01-31 ENCOUNTER — Ambulatory Visit: Payer: Self-pay | Admitting: Urology

## 2018-02-04 ENCOUNTER — Ambulatory Visit: Payer: 59

## 2018-02-08 ENCOUNTER — Encounter: Payer: Self-pay | Admitting: Urology

## 2018-02-14 ENCOUNTER — Other Ambulatory Visit: Payer: Self-pay

## 2018-02-14 ENCOUNTER — Inpatient Hospital Stay: Payer: 59 | Attending: Oncology

## 2018-02-14 DIAGNOSIS — C20 Malignant neoplasm of rectum: Secondary | ICD-10-CM

## 2018-02-14 DIAGNOSIS — Z85048 Personal history of other malignant neoplasm of rectum, rectosigmoid junction, and anus: Secondary | ICD-10-CM | POA: Diagnosis not present

## 2018-02-14 LAB — CBC WITH DIFFERENTIAL/PLATELET
ABS IMMATURE GRANULOCYTES: 0.02 10*3/uL (ref 0.00–0.07)
BASOS ABS: 0.1 10*3/uL (ref 0.0–0.1)
BASOS PCT: 2 %
Eosinophils Absolute: 0.2 10*3/uL (ref 0.0–0.5)
Eosinophils Relative: 4 %
HCT: 45.3 % (ref 39.0–52.0)
Hemoglobin: 14.3 g/dL (ref 13.0–17.0)
Immature Granulocytes: 1 %
LYMPHS PCT: 24 %
Lymphs Abs: 1 10*3/uL (ref 0.7–4.0)
MCH: 28.6 pg (ref 26.0–34.0)
MCHC: 31.6 g/dL (ref 30.0–36.0)
MCV: 90.6 fL (ref 80.0–100.0)
MONO ABS: 0.4 10*3/uL (ref 0.1–1.0)
Monocytes Relative: 10 %
NEUTROS PCT: 59 %
NRBC: 0 % (ref 0.0–0.2)
Neutro Abs: 2.5 10*3/uL (ref 1.7–7.7)
PLATELETS: 340 10*3/uL (ref 150–400)
RBC: 5 MIL/uL (ref 4.22–5.81)
RDW: 14.5 % (ref 11.5–15.5)
WBC: 4.1 10*3/uL (ref 4.0–10.5)

## 2018-02-15 ENCOUNTER — Encounter: Payer: Self-pay | Admitting: Urology

## 2018-02-15 LAB — CEA: CEA1: 5.7 ng/mL — AB (ref 0.0–4.7)

## 2018-02-20 ENCOUNTER — Encounter: Payer: Self-pay | Admitting: Oncology

## 2018-03-05 DIAGNOSIS — H4720 Unspecified optic atrophy: Secondary | ICD-10-CM | POA: Diagnosis not present

## 2018-03-22 ENCOUNTER — Encounter: Payer: Self-pay | Admitting: Family Medicine

## 2018-03-22 ENCOUNTER — Other Ambulatory Visit: Payer: Self-pay

## 2018-03-22 ENCOUNTER — Ambulatory Visit (INDEPENDENT_AMBULATORY_CARE_PROVIDER_SITE_OTHER): Payer: 59 | Admitting: Family Medicine

## 2018-03-22 VITALS — BP 128/72 | HR 64 | Temp 97.8°F | Resp 18 | Ht 75.0 in | Wt 282.3 lb

## 2018-03-22 DIAGNOSIS — H409 Unspecified glaucoma: Secondary | ICD-10-CM

## 2018-03-22 DIAGNOSIS — J302 Other seasonal allergic rhinitis: Secondary | ICD-10-CM | POA: Diagnosis not present

## 2018-03-22 DIAGNOSIS — J9801 Acute bronchospasm: Secondary | ICD-10-CM | POA: Diagnosis not present

## 2018-03-22 DIAGNOSIS — H5461 Unqualified visual loss, right eye, normal vision left eye: Secondary | ICD-10-CM | POA: Diagnosis not present

## 2018-03-22 DIAGNOSIS — C20 Malignant neoplasm of rectum: Secondary | ICD-10-CM

## 2018-03-22 DIAGNOSIS — J069 Acute upper respiratory infection, unspecified: Secondary | ICD-10-CM | POA: Diagnosis not present

## 2018-03-22 DIAGNOSIS — Z933 Colostomy status: Secondary | ICD-10-CM

## 2018-03-22 DIAGNOSIS — E66812 Obesity, class 2: Secondary | ICD-10-CM | POA: Insufficient documentation

## 2018-03-22 DIAGNOSIS — Z6835 Body mass index (BMI) 35.0-35.9, adult: Secondary | ICD-10-CM | POA: Diagnosis not present

## 2018-03-22 DIAGNOSIS — R7989 Other specified abnormal findings of blood chemistry: Secondary | ICD-10-CM | POA: Diagnosis not present

## 2018-03-22 DIAGNOSIS — M5136 Other intervertebral disc degeneration, lumbar region: Secondary | ICD-10-CM

## 2018-03-22 DIAGNOSIS — Z0000 Encounter for general adult medical examination without abnormal findings: Secondary | ICD-10-CM | POA: Diagnosis not present

## 2018-03-22 DIAGNOSIS — N529 Male erectile dysfunction, unspecified: Secondary | ICD-10-CM

## 2018-03-22 MED ORDER — ALBUTEROL SULFATE 108 (90 BASE) MCG/ACT IN AEPB: 1.0000 | INHALATION_SPRAY | Freq: Four times a day (QID) | RESPIRATORY_TRACT | 1 refills | Status: DC | PRN

## 2018-03-22 MED ORDER — ALBUTEROL SULFATE HFA 108 (90 BASE) MCG/ACT IN AERS
2.0000 | INHALATION_SPRAY | Freq: Four times a day (QID) | RESPIRATORY_TRACT | 0 refills | Status: DC | PRN
Start: 2018-03-22 — End: 2018-06-14

## 2018-03-22 MED ORDER — PREDNISONE 10 MG PO TABS: ORAL_TABLET | ORAL | 0 refills | Status: AC

## 2018-03-22 MED ORDER — BENZONATATE 100 MG PO CAPS: 100.0000 mg | ORAL_CAPSULE | Freq: Three times a day (TID) | ORAL | 0 refills | Status: DC | PRN

## 2018-03-22 MED ORDER — GUAIFENESIN ER 600 MG PO TB12: 600.0000 mg | ORAL_TABLET | Freq: Two times a day (BID) | ORAL | 0 refills | Status: DC

## 2018-03-22 MED ORDER — PROMETHAZINE-DM 6.25-15 MG/5ML PO SYRP: 5.0000 mL | ORAL_SOLUTION | Freq: Four times a day (QID) | ORAL | 0 refills | Status: DC | PRN

## 2018-03-22 NOTE — Addendum Note (Signed)
Addended by: Hubbard Hartshorn on: 03/22/2018 09:46 AM   Modules accepted: Orders

## 2018-03-22 NOTE — Progress Notes (Signed)
Name: [name] [name]   MRN: [id number]    DOB: 16-May-1955   Date:03/22/2018       Progress Note  Subjective  Chief Complaint  Chief Complaint  Patient presents with  . Annual Exam    HPI  Patient presents for annual CPE and for the following concerns:  Cough/URI: Has been having symptoms for 3-4 days, cough worsening over the last 2 days.  Has trouble stopping coughing once he starts.  Shortness of breath only with coughing.  Denies fevers/chills, chest pain.  Taking zyrtec daily.  Low T: Seeing Dr. Bernardo Heater with urology, he recently started testosterone supplement, feeling like he has more energy now. Due for lipid screening.  He is using Cialis - this is working well for him, though he states Dr. 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   Rectal Cancer History: He is following up with Dr. Grayland Ormond; has LEFT sided colostomy present and states this is doing well. CEA had gone up, so he is following up every 3 months for right now.  It has been almost 5 years since his diagnosis and surgical treatment.  Uses immodium PRN.  Allergic Rhinitis: Taking Zyrtec daily; using nasal sprays - will go between afrin exrtra moisturizing and ocean spray.  He is doing well on this regimen.   History of RIGHT eye vision deficit: Seeing Dr. Edison Pace with [facility]; he uses timolol, and was diagnosed with glaucoma, though they are not sure that this is the exact cause of the vision changers.  Does have vision decreased and some color loss (yellow, some greens).  Lumbar Back pain and history of back surgery: he takes meloxicam as needed, but has not needed it in several months; is no longer followed regularly by spine specialist (Was seeing Dr. Sabra Heck).  Does have some numbness and tingling in the LEFT foot that has been ongoing since he had ruptured disc/surgery (many years).  Tobacco Use: He does use chewing tobacco  during baseball coaching - uses it about every day.  1 can (15 pouches) will last him about a week.  No mouth sores or lesions.   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  Diet is fairly erratic; eats meat and potatoes, does have salads and other vegetables and fruits.   USPSTF grade A and B recommendations:  Depression:  Depression screen [facility] 2/9 03/22/2018 09/21/2017 07/23/2017 03/06/2017 02/20/2017  Decreased Interest 0 0 0 0 0  Down, Depressed, Hopeless 0 0 0 0 0  PHQ - 2 Score 0 0 0 0 0  Altered sleeping 0 0 - - -  Tired, decreased energy 0 0 - - -  Change in appetite 0 0 - - -  Feeling bad or failure about yourself  0 0 - - -  Trouble concentrating 0 0 - - -  Moving slowly or fidgety/restless 0 0 - - -  Suicidal thoughts 0 0 - - -  PHQ-9 Score 0 0 - - -  Difficult doing work/chores Not difficult at all Not difficult at all - - -    Hypertension:  BP Readings from Last 3 Encounters:  03/22/18 128/72  12/11/17 (!) 162/99  11/07/17 (!) 169/92   Obesity: Wt Readings from Last 3 Encounters:  03/22/18 282 lb 4.8 oz (128.1 kg)  12/11/17 281 lb 4.8 oz (127.6 kg)  11/07/17 281 lb 6.4 oz (127.6 kg)   BMI Readings from Last 3 Encounters:  03/22/18  35.29 kg/m  12/11/17 35.16 kg/m  11/07/17 35.17 kg/m    Lipids:  Lab Results  Component Value Date   CHOL 185 02/20/2017   CHOL 188 10/11/2016   CHOL 165 07/03/2016   Lab Results  Component Value Date   HDL 39 (L) 02/20/2017   HDL 40 (L) 10/11/2016   HDL 39 (L) 07/03/2016   Lab Results  Component Value Date   LDLCALC 117 (H) 02/20/2017   LDLCALC 118 (H) 10/11/2016   LDLCALC 82 07/03/2016   Lab Results  Component Value Date   TRIG 176 (H) 02/20/2017   TRIG 189 (H) 10/11/2016   TRIG 218 (H) 07/03/2016   Lab Results  Component Value Date   CHOLHDL 4.7 02/20/2017   CHOLHDL 4.7 10/11/2016   CHOLHDL 4.2 07/03/2016   No results found for: LDLDIRECT Glucose:  Glucose  Date Value Ref Range Status  12/25/2013 116 (H) 65 - 99  mg/dL Final  11/27/2013 128 (H) 65 - 99 mg/dL Final  10/30/2013 102 (H) 65 - 99 mg/dL Final   Glucose, Bld  Date Value Ref Range Status  07/03/2016 100 (H) 65 - 99 mg/dL Final  07/28/2014 105 (H) 65 - 99 mg/dL Final      Office Visit from 03/22/2018 in [facility]  AUDIT-C Score  1     Married STD testing and prevention (HIV/chl/gon/syphilis): No concerns. Declines Hep C: Negative in 2018  Skin cancer: No concerning lesions Colorectal cancer: See HPI regarding discussion; ostomy in place. Prostate cancer: Following with Dr. Bernardo Heater. Lab Results  Component Value Date   PSA 1.7 07/03/2016   Lung cancer:  Low Dose CT Chest recommended if Age 46-80 years, 30 pack-year currently smoking OR have quit w/in 15years. Patient does not qualify.   AAA: Does not qualify. The USPSTF recommends one-time screening with ultrasonography in men ages 35 to 41 years who have ever smoked ECG: Denies chest pain, palpitations.  Has URI today and does have some shortness of breath.  Advanced Care Planning: A voluntary discussion about advance care planning including the explanation and discussion of advance directives.  Discussed health care proxy and Living will, and the patient was able to identify a health care proxy as Pranish Akhavan.  Patient does not have a living will at present time. If patient does have living will, I have requested they bring this to the clinic to be scanned in to their chart.  Patient Active Problem List   Diagnosis Date Noted  . Decreased vision of right eye 09/21/2017  . Colostomy in place [facility]) 09/21/2017  . Low testosterone in male 09/21/2017  . Seasonal allergies   . Degeneration of lumbar intervertebral disc 06/26/2017  . Rectal cancer ([place]) 08/05/2014    Past Surgical History:  Procedure Laterality Date  . BACK SURGERY    . CHOLECYSTECTOMY    . COLON SURGERY    . KNEE SURGERY Bilateral   . PORT-A-CATH REMOVAL Right 12/02/2014   Procedure:  REMOVAL PORT-A-CATH;  Surgeon: Marlyce Huge, MD;  Location: [facility] ORS;  Service: General;  Laterality: Right;  . PORTACATH PLACEMENT      Family History  Problem Relation Age of Onset  . Cancer Mother   . Hypertension Mother   . COPD Mother   . COPD Father   . Cancer Father        Prostate CA, on Lupron  . Stroke Maternal Grandmother   . Cancer Paternal Grandmother   . Heart attack Paternal Grandfather   .  Cancer Paternal Grandfather     Social History   Socioeconomic History  . Marital status: Married    Spouse name: Olegario Shearer  . Number of children: Not on file  . Years of education: Not on file  . Highest education level: Not on file  Occupational History  . Not on file  Social Needs  . Financial resource strain: Not hard at all  . Food insecurity:    Worry: Never true    Inability: Never true  . Transportation needs:    Medical: No    Non-medical: No  Tobacco Use  . Smoking status: Never Smoker  . Smokeless tobacco: Current User    Types: Snuff  Substance and Sexual Activity  . Alcohol use: Yes    Alcohol/week: 0.0 standard drinks    Comment: RARE  . Drug use: No  . Sexual activity: Yes    Partners: Female  Lifestyle  . Physical activity:    Days per week: 5 days    Minutes per session: 60 min  . Stress: Not at all  Relationships  . Social connections:    Talks on phone: More than three times a week    Gets together: More than three times a week    Attends religious service: More than 4 times per year    Active member of club or organization: Not on file    Attends meetings of clubs or organizations: 1 to 4 times per year    Relationship status: Married  . Intimate partner violence:    Fear of current or ex partner: No    Emotionally abused: No    Physically abused: No    Forced sexual activity: No  Other Topics Concern  . Not on file  Social History Narrative  . Not on file    Current Outpatient Medications:  .  cetirizine (ZYRTEC) 10 MG  tablet, Take 10 mg by mouth daily., Disp: , Rfl:  .  Loperamide HCl (IMODIUM A-D PO), Take by mouth as needed., Disp: , Rfl:  .  Magnesium 400 MG CAPS, Take 1 capsule by mouth at bedtime as needed. , Disp: , Rfl:  .  meloxicam (MOBIC) 15 MG tablet, , Disp: , Rfl:  .  Multiple Vitamin (MULTIVITAMIN) tablet, Take 1 tablet by mouth daily., Disp: , Rfl:  .  sodium chloride (OCEAN) 0.65 % SOLN nasal spray, Place 1 spray into both nostrils as needed for congestion., Disp: , Rfl:  .  tadalafil (ADCIRCA/CIALIS) 20 MG tablet, Take 1 tablet (20 mg total) by mouth daily as needed for erectile dysfunction., Disp: 6 tablet, Rfl: 10 .  testosterone cypionate (DEPOTESTOSTERONE CYPIONATE) 200 MG/ML injection, Inject 1 mL (200 mg total) into the muscle every 14 (fourteen) days., Disp: 10 mL, Rfl: 0 .  timolol (BETIMOL) 0.5 % ophthalmic solution, Place 1 drop into the right eye daily., Disp: , Rfl:  .  glucosamine-chondroitin 500-400 MG tablet, Take 1 tablet by mouth 3 (three) times daily., Disp: , Rfl:   Allergies  Allergen Reactions  . Sulfa Antibiotics Hives    Other reaction(s): Other (qualifier value)    ROS  Ten systems reviewed and is negative except as mentioned in HPI  Objective  Vitals:   03/22/18 0747  BP: 128/72  Pulse: 64  Resp: 18  Temp: 97.8 F (36.6 C)  TempSrc: Oral  SpO2: 99%  Weight: 282 lb 4.8 oz (128.1 kg)  Height: 6\' 3"  (1.905 m)    Body mass index is 35.29 kg/m.  Physical Exam   Constitutional: Patient appears well-developed and well-nourished. No distress.  HENT: Head: Normocephalic and atraumatic. Ears: B TMs ok, no erythema or effusion; Nose: Nose normal. Mouth/Throat: Oropharynx is clear and moist. No oropharyngeal exudate.  Eyes: Conjunctivae and EOM are normal. Pupils are equal, round, and reactive to light. No scleral icterus.  Neck: Normal range of motion. Neck supple. No JVD present. No thyromegaly present.  Cardiovascular: Normal rate, regular rhythm and  normal heart sounds.  No murmur heard. No BLE edema. Pulmonary/Chest: Effort normal and breath sounds normal. No respiratory distress. Raspy, dry cough is present throughout examination Abdominal: Soft. Bowel sounds are normal, no distension. There is no tenderness. no masses MALE GENITALIA: Deferred RECTAL: Deferred.  Ostomy is in place on LEFT abdomen and WNL. Stoma is pink and appears healthy. Musculoskeletal: Normal range of motion, no joint effusions. No gross deformities Neurological: he is alert and oriented to person, place, and time. No cranial nerve deficit. Coordination, balance, strength, speech and gait are normal.  Skin: Skin is warm and dry. No rash noted. No erythema.  Psychiatric: Patient has a normal mood and affect. behavior is normal. Judgment and thought content normal.  Recent Results (from the past 2160 hour(s))  CBC with Differential     Status: None   Collection Time: 02/14/18 10:27 AM  Result Value Ref Range   WBC 4.1 4.0 - 10.5 K/uL   RBC 5.00 4.22 - 5.81 MIL/uL   Hemoglobin 14.3 13.0 - 17.0 g/dL   HCT 45.3 39.0 - 52.0 %   MCV 90.6 80.0 - 100.0 fL   MCH 28.6 26.0 - 34.0 pg   MCHC 31.6 30.0 - 36.0 g/dL   RDW 14.5 11.5 - 15.5 %   Platelets 340 150 - 400 K/uL   nRBC 0.0 0.0 - 0.2 %   Neutrophils Relative % 59 %   Neutro Abs 2.5 1.7 - 7.7 K/uL   Lymphocytes Relative 24 %   Lymphs Abs 1.0 0.7 - 4.0 K/uL   Monocytes Relative 10 %   Monocytes Absolute 0.4 0.1 - 1.0 K/uL   Eosinophils Relative 4 %   Eosinophils Absolute 0.2 0.0 - 0.5 K/uL   Basophils Relative 2 %   Basophils Absolute 0.1 0.0 - 0.1 K/uL   Immature Granulocytes 1 %   Abs Immature Granulocytes 0.02 0.00 - 0.07 K/uL    Comment: Performed at [facility], [address] [place], [place] [address]  CEA     Status: Abnormal   Collection Time: 02/14/18 10:27 AM  Result Value Ref Range   CEA 5.7 (H) 0.0 - 4.7 ng/mL    Comment: (NOTE)                             Nonsmokers          <3.9                              Smokers             <5.6 [employer] Electrochemiluminescence Immunoassay (ECLIA) Values obtained with different assay methods or kits cannot be used interchangeably.  Results cannot be interpreted as absolute evidence of the presence or absence of malignant disease. Performed At: [facility] [place], [place] [id number] Rush Farmer MD KD:[id number]      PHQ2/9: Depression screen [facility] 2/9 03/22/2018 09/21/2017 07/23/2017 03/06/2017 02/20/2017  Decreased Interest 0 0 0 0 0  Down, Depressed, Hopeless 0 0 0 0 0  PHQ - 2 Score 0 0 0 0 0  Altered sleeping 0 0 - - -  Tired, decreased energy 0 0 - - -  Change in appetite 0 0 - - -  Feeling bad or failure about yourself  0 0 - - -  Trouble concentrating 0 0 - - -  Moving slowly or fidgety/restless 0 0 - - -  Suicidal thoughts 0 0 - - -  PHQ-9 Score 0 0 - - -  Difficult doing work/chores Not difficult at all Not difficult at all - - -   Fall Risk: Fall Risk  03/22/2018 09/21/2017 07/23/2017 03/06/2017 02/20/2017  Falls in the past year? 0 No No No No  Number falls in past yr: 0 - - - -  Injury with Fall? 0 - - - -  Follow up Falls evaluation completed - - - -    Assessment & Plan  1. Annual physical exam -Prostate cancer screening and PSA options (with potential risks and benefits of testing vs not testing) were discussed along with recent recs/guidelines. -USPSTF grade A and B recommendations reviewed with patient; age-appropriate recommendations, preventive care, screening tests, etc discussed and encouraged; healthy living encouraged; see AVS for patient education given to patient -Discussed importance of 150 minutes of physical activity weekly, eat two servings of fish weekly, eat one serving of tree nuts ( cashews, pistachios, pecans, almonds.[name]) every other day, eat 6 servings of fruit/vegetables daily and drink plenty of water and avoid sweet beverages.  - Ambulatory referral to  Dermatology  2. Rectal cancer ([place]) - Keep follow up with Dr. Grayland Ormond  3. Colostomy in place [facility]) - Doing well, no concerns  4. Seasonal allergies - May switch to allegra or Xyzal if not well controlled this spring with Zyrtec  5. Low testosterone in male - Continue with urology - Lipid panel - COMPLETE METABOLIC PANEL WITH GFR  6. Upper respiratory tract infection, unspecified type - predniSONE (DELTASONE) 10 MG tablet; Take 5 tablets (50 mg total) by mouth daily with breakfast for 1 day, THEN 4 tablets (40 mg total) daily with breakfast for 1 day, THEN 3 tablets (30 mg total) daily with breakfast for 1 day, THEN 2 tablets (20 mg total) daily with breakfast for 1 day, THEN 1 tablet (10 mg total) daily with breakfast for 1 day.  Dispense: 15 tablet; Refill: 0 - benzonatate (TESSALON PERLES) 100 MG capsule; Take 1 capsule (100 mg total) by mouth 3 (three) times daily as needed.  Dispense: 30 capsule; Refill: 0 - promethazine-dextromethorphan (PROMETHAZINE-DM) 6.25-15 MG/5ML syrup; Take 5 mLs by mouth 4 (four) times daily as needed for cough.  Dispense: 118 mL; Refill: 0 - guaiFENesin (MUCINEX) 600 MG 12 hr tablet; Take 1 tablet (600 mg total) by mouth 2 (two) times daily.  Dispense: 20 tablet; Refill: 0  7. Bronchospasm - predniSONE (DELTASONE) 10 MG tablet; Take 5 tablets (50 mg total) by mouth daily with breakfast for 1 day, THEN 4 tablets (40 mg total) daily with breakfast for 1 day, THEN 3 tablets (30 mg total) daily with breakfast for 1 day, THEN 2 tablets (20 mg total) daily with breakfast for 1 day, THEN 1 tablet (10 mg total) daily with breakfast for 1 day.  Dispense: 15 tablet; Refill: 0 - benzonatate (TESSALON PERLES) 100 MG capsule; Take 1 capsule (100 mg total) by mouth 3 (three) times daily as needed.  Dispense: 30 capsule; Refill: 0 - promethazine-dextromethorphan (PROMETHAZINE-DM) 6.25-15 MG/5ML syrup; Take 5 mLs by mouth 4 (four) times daily as needed for cough.  Dispense:  118 mL; Refill: 0 - guaiFENesin (MUCINEX) 600 MG 12 hr tablet; Take 1 tablet (600 mg total) by mouth 2 (two) times daily.  Dispense: 20 tablet; Refill: 0  8. Decreased vision of right eye - Keep routine eye exams with [place]  9. Glaucoma of right eye, unspecified glaucoma type - Keep routine eye exams with [place]  10. Erectile dysfunction, unspecified erectile dysfunction type - Keep follow up with Urology  11. Degeneration of lumbar intervertebral disc - Stable, doing well  12. Class 2 severe obesity due to excess calories with serious comorbidity and body mass index (BMI) of 35.0 to 35.9 in adult [facility]) - See above regarding teaching. - Lipid panel - COMPLETE METABOLIC PANEL WITH GFR  13. Morbid obesity ([place]) - Lipid panel - COMPLETE METABOLIC PANEL WITH GFR

## 2018-03-22 NOTE — Addendum Note (Signed)
Addended by: Hubbard Hartshorn on: 03/22/2018 09:16 AM   Modules accepted: Orders

## 2018-03-22 NOTE — Patient Instructions (Signed)
Please message me with the name of the Doctor and the date of your last colonoscopy.   Preventive Care 40-64 Years, Male Preventive care refers to lifestyle choices and visits with your health care provider that can promote health and wellness. What does preventive care include?   A yearly physical exam. This is also called an annual well check.  Dental exams once or twice a year.  Routine eye exams. Ask your health care provider how often you should have your eyes checked.  Personal lifestyle choices, including: ? Daily care of your teeth and gums. ? Regular physical activity. ? Eating a healthy diet. ? Avoiding tobacco and drug use. ? Limiting alcohol use. ? Practicing safe sex. ? Taking low-dose aspirin every day starting at age 78. What happens during an annual well check? The services and screenings done by your health care provider during your annual well check will depend on your age, overall health, lifestyle risk factors, and family history of disease. Counseling Your health care provider may ask you questions about your:  Alcohol use.  Tobacco use.  Drug use.  Emotional well-being.  Home and relationship well-being.  Sexual activity.  Eating habits.  Work and work Statistician. Screening You may have the following tests or measurements:  Height, weight, and BMI.  Blood pressure.  Lipid and cholesterol levels. These may be checked every 5 years, or more frequently if you are over 2 years old.  Skin check.  Lung cancer screening. You may have this screening every year starting at age 21 if you have a 30-pack-year history of smoking and currently smoke or have quit within the past 15 years.  Colorectal cancer screening. All adults should have this screening starting at age 38 and continuing until age 50. Your health care provider may recommend screening at age 34. You will have tests every 1-10 years, depending on your results and the type of screening  test. People at increased risk should start screening at an earlier age. Screening tests may include: ? Guaiac-based fecal occult blood testing. ? Fecal immunochemical test (FIT). ? Stool DNA test. ? Virtual colonoscopy. ? Sigmoidoscopy. During this test, a flexible tube with a tiny camera (sigmoidoscope) is used to examine your rectum and lower colon. The sigmoidoscope is inserted through your anus into your rectum and lower colon. ? Colonoscopy. During this test, a long, thin, flexible tube with a tiny camera (colonoscope) is used to examine your entire colon and rectum.  Prostate cancer screening. Recommendations will vary depending on your family history and other risks.  Hepatitis C blood test.  Hepatitis B blood test.  Sexually transmitted disease (STD) testing.  Diabetes screening. This is done by checking your blood sugar (glucose) after you have not eaten for a while (fasting). You may have this done every 1-3 years. Discuss your test results, treatment options, and if necessary, the need for more tests with your health care provider. Vaccines Your health care provider may recommend certain vaccines, such as:  Influenza vaccine. This is recommended every year.  Tetanus, diphtheria, and acellular pertussis (Tdap, Td) vaccine. You may need a Td booster every 10 years.  Varicella vaccine. You may need this if you have not been vaccinated.  Zoster vaccine. You may need this after age 33.  Measles, mumps, and rubella (MMR) vaccine. You may need at least one dose of MMR if you were born in 1957 or later. You may also need a second dose.  Pneumococcal 13-valent conjugate (PCV13) vaccine. You  may need this if you have certain conditions and have not been vaccinated.  Pneumococcal polysaccharide (PPSV23) vaccine. You may need one or two doses if you smoke cigarettes or if you have certain conditions.  Meningococcal vaccine. You may need this if you have certain  conditions.  Hepatitis A vaccine. You may need this if you have certain conditions or if you travel or work in places where you may be exposed to hepatitis A.  Hepatitis B vaccine. You may need this if you have certain conditions or if you travel or work in places where you may be exposed to hepatitis B.  Haemophilus influenzae type b (Hib) vaccine. You may need this if you have certain risk factors. Talk to your health care provider about which screenings and vaccines you need and how often you need them. This information is not intended to replace advice given to you by your health care provider. Make sure you discuss any questions you have with your health care provider. Document Released: 02/05/2015 Document Revised: 03/01/2017 Document Reviewed: 11/10/2014 Elsevier Interactive Patient Education  2019 [employer].

## 2018-03-23 LAB — COMPLETE METABOLIC PANEL WITH GFR
AG Ratio: 1.9 (calc) (ref 1.0–2.5)
ALBUMIN MSPROF: 4.3 g/dL (ref 3.6–5.1)
ALKALINE PHOSPHATASE (APISO): 61 U/L (ref 35–144)
ALT: 12 U/L (ref 9–46)
AST: 17 U/L (ref 10–35)
BILIRUBIN TOTAL: 0.3 mg/dL (ref 0.2–1.2)
BUN: 13 mg/dL (ref 7–25)
CHLORIDE: 106 mmol/L (ref 98–110)
CO2: 26 mmol/L (ref 20–32)
CREATININE: 1.17 mg/dL (ref 0.70–1.25)
Calcium: 9.7 mg/dL (ref 8.6–10.3)
GFR, EST AFRICAN AMERICAN: 77 mL/min/{1.73_m2} (ref >=60)
GFR, Est Non African American: 66 mL/min/{1.73_m2} (ref >=60)
GLOBULIN: 2.3 g/dL (ref 1.9–3.7)
GLUCOSE: 80 mg/dL (ref 65–99)
Potassium: 4.6 mmol/L (ref 3.5–5.3)
SODIUM: 141 mmol/L (ref 135–146)
TOTAL PROTEIN: 6.6 g/dL (ref 6.1–8.1)

## 2018-03-23 LAB — LIPID PANEL
CHOLESTEROL: 147 mg/dL (ref <200)
HDL: 35 mg/dL — ABNORMAL LOW (ref >=40)
LDL CHOLESTEROL (CALC): 89 mg/dL
Non-HDL Cholesterol (Calc): 112 mg/dL (calc) (ref <130)
TRIGLYCERIDES: 126 mg/dL (ref <150)
Total CHOL/HDL Ratio: 4.2 (calc) (ref <5.0)

## 2018-04-01 ENCOUNTER — Encounter: Payer: Self-pay | Admitting: Family Medicine

## 2018-04-01 DIAGNOSIS — G629 Polyneuropathy, unspecified: Secondary | ICD-10-CM

## 2018-04-01 DIAGNOSIS — M5136 Other intervertebral disc degeneration, lumbar region: Secondary | ICD-10-CM

## 2018-04-03 MED ORDER — GABAPENTIN 100 MG PO CAPS: ORAL_CAPSULE | ORAL | 1 refills | Status: DC

## 2018-04-03 NOTE — Telephone Encounter (Signed)
Please call to schedule 1 month foot pain follow up. Thank you!

## 2018-04-23 ENCOUNTER — Inpatient Hospital Stay: Payer: 59

## 2018-05-09 ENCOUNTER — Other Ambulatory Visit: Payer: 59

## 2018-05-14 ENCOUNTER — Encounter: Payer: Self-pay | Admitting: Urology

## 2018-05-15 NOTE — Telephone Encounter (Signed)
done

## 2018-05-15 NOTE — Telephone Encounter (Signed)
-----   Message from Abbie Sons, MD sent at 05/15/2018  3:19 PM EDT ----- Regarding: Lab visit See my note of 12/27/2017.  He was supposed to have a follow-up visit in February which does not look like it got scheduled.  He needs a lab visit for PSA/testosterone/hematocrit and a virtual visit with me after labs.

## 2018-05-18 ENCOUNTER — Encounter: Payer: Self-pay | Admitting: Oncology

## 2018-05-20 ENCOUNTER — Other Ambulatory Visit: Payer: Self-pay | Admitting: *Deleted

## 2018-05-20 DIAGNOSIS — C20 Malignant neoplasm of rectum: Secondary | ICD-10-CM

## 2018-05-21 ENCOUNTER — Encounter: Payer: Self-pay | Admitting: Urology

## 2018-05-23 MED FILL — TIMOLOL 0.5% EYE DROPS: 0.5 | 90 days supply | Qty: 5 | Fill #0

## 2018-06-04 ENCOUNTER — Other Ambulatory Visit: Payer: Self-pay

## 2018-06-04 ENCOUNTER — Inpatient Hospital Stay: Payer: 59 | Attending: Oncology

## 2018-06-04 DIAGNOSIS — D72819 Decreased white blood cell count, unspecified: Secondary | ICD-10-CM | POA: Diagnosis not present

## 2018-06-04 DIAGNOSIS — Z79899 Other long term (current) drug therapy: Secondary | ICD-10-CM | POA: Insufficient documentation

## 2018-06-04 DIAGNOSIS — Z933 Colostomy status: Secondary | ICD-10-CM | POA: Diagnosis not present

## 2018-06-04 DIAGNOSIS — C20 Malignant neoplasm of rectum: Secondary | ICD-10-CM

## 2018-06-04 LAB — CBC WITH DIFFERENTIAL/PLATELET
Abs Immature Granulocytes: 0.01 10*3/uL (ref 0.00–0.07)
Basophils Absolute: 0.1 10*3/uL (ref 0.0–0.1)
Basophils Relative: 1 %
Eosinophils Absolute: 0.2 10*3/uL (ref 0.0–0.5)
Eosinophils Relative: 6 %
HCT: 47.7 % (ref 39.0–52.0)
Hemoglobin: 15.3 g/dL (ref 13.0–17.0)
Immature Granulocytes: 0 %
Lymphocytes Relative: 24 %
Lymphs Abs: 0.9 10*3/uL (ref 0.7–4.0)
MCH: 27.4 pg (ref 26.0–34.0)
MCHC: 32.1 g/dL (ref 30.0–36.0)
MCV: 85.3 fL (ref 80.0–100.0)
Monocytes Absolute: 0.4 10*3/uL (ref 0.1–1.0)
Monocytes Relative: 10 %
Neutro Abs: 2.1 10*3/uL (ref 1.7–7.7)
Neutrophils Relative %: 59 %
Platelets: 311 10*3/uL (ref 150–400)
RBC: 5.59 MIL/uL (ref 4.22–5.81)
RDW: 15.4 % (ref 11.5–15.5)
WBC: 3.6 10*3/uL — ABNORMAL LOW (ref 4.0–10.5)
nRBC: 0 % (ref 0.0–0.2)

## 2018-06-04 LAB — PSA: Prostatic Specific Antigen: 3.37 ng/mL (ref 0.00–4.00)

## 2018-06-05 ENCOUNTER — Other Ambulatory Visit: Payer: Self-pay | Admitting: Oncology

## 2018-06-05 ENCOUNTER — Other Ambulatory Visit: Payer: Self-pay

## 2018-06-05 ENCOUNTER — Encounter: Payer: Self-pay | Admitting: Oncology

## 2018-06-05 ENCOUNTER — Telehealth: Payer: Self-pay

## 2018-06-05 DIAGNOSIS — C20 Malignant neoplasm of rectum: Secondary | ICD-10-CM

## 2018-06-05 LAB — TESTOSTERONE: Testosterone: 81 ng/dL — ABNORMAL LOW (ref 264–916)

## 2018-06-05 LAB — CEA: CEA: 8.3 ng/mL — ABNORMAL HIGH (ref 0.0–4.7)

## 2018-06-05 NOTE — Telephone Encounter (Signed)
Called patient back to explain why he needed to have a CT Scan done. Patient understood and was okay to proceed. Patient then had no further questions.

## 2018-06-06 MED FILL — TADALAFIL 20 MG TABS: 20 | 30 days supply | Qty: 6 | Fill #0

## 2018-06-06 MED FILL — GABAPENTIN 100 MG CAPSULE: 100 | 30 days supply | Qty: 90 | Fill #0

## 2018-06-10 ENCOUNTER — Encounter: Payer: Self-pay | Admitting: Gastroenterology

## 2018-06-10 ENCOUNTER — Other Ambulatory Visit: Payer: Self-pay

## 2018-06-10 ENCOUNTER — Other Ambulatory Visit: Payer: 59

## 2018-06-10 ENCOUNTER — Ambulatory Visit (INDEPENDENT_AMBULATORY_CARE_PROVIDER_SITE_OTHER): Payer: 59 | Admitting: Gastroenterology

## 2018-06-10 DIAGNOSIS — Z85038 Personal history of other malignant neoplasm of large intestine: Secondary | ICD-10-CM | POA: Diagnosis not present

## 2018-06-10 MED ORDER — NA SULFATE-K SULFATE-MG SULF 17.5-3.13-1.6 GM/177ML PO SOLN: 1.0000 | ORAL | 0 refills | Status: DC

## 2018-06-10 MED FILL — SUPREP BOWEL PREP KIT: 17.5-3.13-1 | 1 days supply | Qty: 354 | Fill #0

## 2018-06-10 NOTE — Progress Notes (Signed)
[name] Lame, MD [address]  [place]  [place], [place] [address]  Main: [phone]  Fax: [phone]    Gastroenterology Virtual/Video Visit  Referring Provider:     Lloyd Huger, MD Primary Care Physician:  Hubbard Hartshorn, FNP Primary Gastroenterologist:  Dr.Alysandra Lobue Allen Norris Reason for Consultation:     History of rectal cancer        HPI:    Virtual Visit via Video Note Location of the patient: Home Location of provider: Office  Participating persons: The patient myself and Ginger Feldpausch.  I connected with [name] [name] on 06/10/18 at 11:30 AM EDT by a video enabled telemedicine application and verified that I am speaking with the correct person using two identifiers.   I discussed the limitations of evaluation and management by telemedicine and the availability of in person appointments. The patient expressed understanding and agreed to proceed.  Verbal consent to proceed obtained.  History of Present Illness: [name] [name] is a 63 y.o. male referred by Dr. Uvaldo Rising, Astrid Divine, FNP  for consultation & management of his history of rectal cancer.  The patient is followed by oncology for a history of rectal cancer.  It appears that the patient had a colonoscopy in 2011 and then a repeat in 2014.  I do not have those reports but I see that the patient also had a procedure done by Dr. Francella Solian which I presume was a rectal ultrasound.  The patient reports that he thinks he was diagnosed with rectal cancer in 2014 and now has a colostomy.  He was recommended to have a repeat colonoscopy after that but has not followed up until now.  He denies any rectal bleeding unexplained weight loss nausea vomiting fevers or chills.  He also denies any change in bowel habits.  The patient does report that the reason he has not had a colonoscopy recently was because he has been waiting for a ostomy bag that can hold a prep.  Past Medical History:  Diagnosis Date  . Anemia   . Cancer [facility])     rectal 2015, followed by Oncology, permanent colostomy  . Colostomy in place [facility])   . Kidney stones   . Seasonal allergies     Past Surgical History:  Procedure Laterality Date  . BACK SURGERY    . CHOLECYSTECTOMY    . COLON SURGERY    . KNEE SURGERY Bilateral   . PORT-A-CATH REMOVAL Right 12/02/2014   Procedure: REMOVAL PORT-A-CATH;  Surgeon: Marlyce Huge, MD;  Location: [facility] ORS;  Service: General;  Laterality: Right;  . PORTACATH PLACEMENT      Prior to Admission medications   Medication Sig Start Date End Date Taking? Authorizing Provider  albuterol (PROVENTIL HFA;VENTOLIN HFA) 108 (90 Base) MCG/ACT inhaler Inhale 2 puffs into the lungs every 6 (six) hours as needed for wheezing or shortness of breath. 03/22/18   Hubbard Hartshorn, FNP  benzonatate (TESSALON PERLES) 100 MG capsule Take 1 capsule (100 mg total) by mouth 3 (three) times daily as needed. 03/22/18   Hubbard Hartshorn, FNP  cetirizine (ZYRTEC) 10 MG tablet Take 10 mg by mouth daily.    [provider]  gabapentin (NEURONTIN) 100 MG capsule Take 1 capsule once daily before bed x2 days, then take 2 capsules once daily before bed x2 days, then may increase to 3 capsules once daily before bed if needed. 04/03/18   Hubbard Hartshorn, FNP  guaiFENesin (MUCINEX) 600 MG 12 hr  tablet Take 1 tablet (600 mg total) by mouth 2 (two) times daily. 03/22/18   Hubbard Hartshorn, FNP  Loperamide HCl (IMODIUM A-D PO) Take by mouth as needed.    [provider]  Magnesium 400 MG CAPS Take 1 capsule by mouth at bedtime as needed.     [provider]  meloxicam (MOBIC) 15 MG tablet  07/23/17   [provider]  Multiple Vitamin (MULTIVITAMIN) tablet Take 1 tablet by mouth daily.    [provider]  promethazine-dextromethorphan (PROMETHAZINE-DM) 6.25-15 MG/5ML syrup Take 5 mLs by mouth 4 (four) times daily as needed for cough. 03/22/18   Hubbard Hartshorn, FNP  sodium chloride (OCEAN) 0.65 % SOLN nasal spray  Place 1 spray into both nostrils as needed for congestion.    [provider]  tadalafil (ADCIRCA/CIALIS) 20 MG tablet Take 1 tablet (20 mg total) by mouth daily as needed for erectile dysfunction. 12/14/17   Stoioff, Ronda Fairly, MD  testosterone cypionate (DEPOTESTOSTERONE CYPIONATE) 200 MG/ML injection Inject 1 mL (200 mg total) into the muscle every 14 (fourteen) days. 12/27/17   Stoioff, Ronda Fairly, MD  timolol (BETIMOL) 0.5 % ophthalmic solution Place 1 drop into the right eye daily.    [provider]    Family History  Problem Relation Age of Onset  . Cancer Mother   . Hypertension Mother   . COPD Mother   . COPD Father   . Cancer Father        Prostate CA, on Lupron  . Stroke Maternal Grandmother   . Cancer Paternal Grandmother   . Heart attack Paternal Grandfather   . Cancer Paternal Grandfather      Social History   Tobacco Use  . Smoking status: Never Smoker  . Smokeless tobacco: Current User    Types: Snuff  Substance Use Topics  . Alcohol use: Yes    Alcohol/week: 0.0 standard drinks    Comment: RARE  . Drug use: No    Allergies as of 06/10/2018 - Review Complete 03/22/2018  Allergen Reaction Noted  . Sulfa antibiotics Hives 07/20/2014    Review of Systems:    All systems reviewed and negative except where noted in HPI.   Observations/Objective:  Labs: CBC    Component Value Date/Time   WBC 3.6 (L) 06/04/2018 1022   RBC 5.59 06/04/2018 1022   HGB 15.3 06/04/2018 1022   HGB 12.8 (L) 02/05/2014 0920   HCT 47.7 06/04/2018 1022   HCT 39.9 (L) 02/05/2014 0920   PLT 311 06/04/2018 1022   PLT 362 02/05/2014 0920   MCV 85.3 06/04/2018 1022   MCV 81 02/05/2014 0920   MCH 27.4 06/04/2018 1022   MCHC 32.1 06/04/2018 1022   RDW 15.4 06/04/2018 1022   RDW 19.7 (H) 02/05/2014 0920   LYMPHSABS 0.9 06/04/2018 1022   LYMPHSABS 0.5 (L) 02/05/2014 0920   MONOABS 0.4 06/04/2018 1022   MONOABS 0.3 02/05/2014 0920   EOSABS 0.2 06/04/2018 1022    EOSABS 0.1 02/05/2014 0920   BASOSABS 0.1 06/04/2018 1022   BASOSABS 0.0 02/05/2014 0920   CMP     Component Value Date/Time   NA 141 03/22/2018 0841   NA 144 12/25/2013 0900   K 4.6 03/22/2018 0841   K 4.2 12/25/2013 0900   CL 106 03/22/2018 0841   CL 107 12/25/2013 0900   CO2 26 03/22/2018 0841   CO2 27 12/25/2013 0900   GLUCOSE 80 03/22/2018 0841   GLUCOSE 116 (H) 12/25/2013  0900   BUN 13 03/22/2018 0841   BUN 21 (H) 12/25/2013 0900   CREATININE 1.17 03/22/2018 0841   CALCIUM 9.7 03/22/2018 0841   CALCIUM 9.3 12/25/2013 0900   PROT 6.6 03/22/2018 0841   PROT 7.1 12/25/2013 0900   ALBUMIN 3.9 07/03/2016 0906   ALBUMIN 3.5 12/25/2013 0900   AST 17 03/22/2018 0841   AST 15 12/25/2013 0900   ALT 12 03/22/2018 0841   ALT 17 12/25/2013 0900   ALKPHOS 58 07/03/2016 0906   ALKPHOS 74 12/25/2013 0900   BILITOT 0.3 03/22/2018 0841   BILITOT 0.3 12/25/2013 0900   GFRNONAA 66 03/22/2018 0841   GFRAA 77 03/22/2018 0841    Imaging Studies: No results found.  Assessment and Plan:   [name] [name] is a 63 y.o. y/o male has been referred for a history of rectal cancer.  The patient has not had a follow-up colonoscopy since having surgery with a colostomy.  The patient has been told that he should have a repeat colonoscopy.  The patient states he is going to contact his ostomy supplier to see if he can get the necessary replacement bags prior to having the colonoscopy.  The patient will be contacted by my office later today to arrange his colonoscopy.I have discussed risks & benefits which include, but are not limited to, bleeding, infection, perforation & drug reaction.  The patient agrees with this plan & written consent will be obtained.     Follow Up Instructions:  I discussed the assessment and treatment plan with the patient. The patient was provided an opportunity to ask questions and all were answered. The patient agreed with the plan and demonstrated an understanding of the  instructions.   The patient was advised to call back or seek an in-person evaluation if the symptoms worsen or if the condition fails to improve as anticipated.  I provided 8 minutes of non-face-to-face time during this encounter.   [name] Lame, MD  Speech recognition software was used to dictate the above note.

## 2018-06-11 ENCOUNTER — Telehealth: Payer: 59 | Admitting: Urology

## 2018-06-12 ENCOUNTER — Telehealth (INDEPENDENT_AMBULATORY_CARE_PROVIDER_SITE_OTHER): Payer: 59 | Admitting: Urology

## 2018-06-12 ENCOUNTER — Other Ambulatory Visit: Payer: Self-pay

## 2018-06-12 DIAGNOSIS — N5202 Corporo-venous occlusive erectile dysfunction: Secondary | ICD-10-CM

## 2018-06-12 DIAGNOSIS — E291 Testicular hypofunction: Secondary | ICD-10-CM | POA: Diagnosis not present

## 2018-06-12 MED ORDER — "NEEDLE (DISP) 18G X 1"" MISC": 0 refills | Status: DC

## 2018-06-12 MED ORDER — TESTOSTERONE CYPIONATE 200 MG/ML IM SOLN: 200.0000 mg | INTRAMUSCULAR | 0 refills | Status: DC

## 2018-06-12 MED ORDER — "SYRINGE/NEEDLE (DISP) 21G X 1-1/2"" 3 ML MISC": 0 refills | Status: DC

## 2018-06-12 MED FILL — TESTOSTERONE CYPIONATE 200: 200 | 84 days supply | Qty: 6 | Fill #0

## 2018-06-12 NOTE — Progress Notes (Signed)
Virtual Visit via Video Note  I connected with [name] [name] on 06/12/18 at  3:00 PM EDT by a video enabled telemedicine application and verified that I am speaking with the correct person using two identifiers.  Location: Patient: Home Provider: Home   I discussed the limitations of evaluation and management by telemedicine and the availability of in person appointments. The patient expressed understanding and agreed to proceed.  History of Present Illness: 63 year old male presents for follow-up of hypogonadism via video visit secondary to COVID-19 pandemic.  I initially saw him in November 2019 with symptomatic hypogonadism previously on AndroGel.  He elected to start testosterone injections and an Rx was sent to his pharmacy with instructions of making a follow-up appointment 6 weeks after he started therapy.  A follow-up appointment was not made and he recently called for a refill however it was recommended he have blood work drawn and an office visit.  On replacement he did note significant improvement in his energy level.  He ran out of testosterone in early April and 2 weeks after his last injection noted recurrent tiredness and fatigue.  He did have blood work drawn on 06/04/2018 and his testosterone level was 81.  Hematocrit was 47.7.  His PSA had bumped from 2.5-3.37.  He also has Perrone's disease and had noted significant provement in his curvature however he complains of mild curvature distally and his penis does not get fully erect distal to the curvature.  His erections have improved with testosterone and tadalafil.   Observations/Objective: Alert, no acute distress  Assessment and Plan: 63 year old male with symptomatic hypogonadism.  Will restart replacement and schedule a follow-up appointment in 8 weeks for symptom reassessment and repeat labs including testosterone and PSA.  If his PSA is rising we will need to discuss prostate MRI/biopsy.  He states the penile curvature  does not prohibit intercourse and it is the decreased rigidity.  Would initially recommend a trial of a venous compression band.  Follow Up Instructions: 8-week follow-up with labs   I discussed the assessment and treatment plan with the patient. The patient was provided an opportunity to ask questions and all were answered. The patient agreed with the plan and demonstrated an understanding of the instructions.   The patient was advised to call back or seek an in-person evaluation if the symptoms worsen or if the condition fails to improve as anticipated.  I provided 15 minutes of non-face-to-face time during this encounter.   Abbie Sons, MD

## 2018-06-13 ENCOUNTER — Ambulatory Visit
Admission: RE | Admit: 2018-06-13 | Discharge: 2018-06-13 | Disposition: A | Discharge status: Home / Self Care | Payer: 59 | Source: Ambulatory Visit | Attending: Oncology | Admitting: Oncology

## 2018-06-13 ENCOUNTER — Other Ambulatory Visit: Payer: Self-pay

## 2018-06-13 DIAGNOSIS — C20 Malignant neoplasm of rectum: Secondary | ICD-10-CM | POA: Diagnosis not present

## 2018-06-13 DIAGNOSIS — R918 Other nonspecific abnormal finding of lung field: Secondary | ICD-10-CM | POA: Diagnosis not present

## 2018-06-13 LAB — POCT I-STAT CREATININE: Creatinine, Ser: 1.2 mg/dL (ref 0.61–1.24)

## 2018-06-13 MED ORDER — IOHEXOL 300 MG/ML  SOLN
100.0000 mL | Freq: Once | INTRAMUSCULAR | Status: AC | PRN
  Administered 2018-06-13: 09:00:00 100 mL via INTRAVENOUS

## 2018-06-13 NOTE — Progress Notes (Signed)
[place]  [phone] [phone]  ID: Daulton Harbaugh [place] OB: [birth year]  MR#: [id number]  OFH#:[id number]  Patient Care Team: Hubbard Hartshorn, FNP as PCP - General (Family Medicine) Marlyce Huge, MD as Attending Physician (Surgery) Lloyd Huger, MD as Consulting Physician (Oncology) Abbie Sons, MD (Urology) Eulogio Bear, MD as Consulting Physician (Ophthalmology)  I connected with Barbara Cower File on 06/17/18 at  9:30 AM EDT by video enabled telemedicine visit and verified that I am speaking with the correct person using two identifiers.   I discussed the limitations, risks, security and privacy concerns of performing an evaluation and management service by telemedicine and the availability of in-person appointments. I also discussed with the patient that there may be a patient responsible charge related to this service. The patient expressed understanding and agreed to proceed.   Other persons participating in the visit and their role in the encounter: Patient, MD  Patients location: Home Providers location: Clinic  CHIEF COMPLAINT: Pathologic stage I rectal adenocarcinoma at the anal verge.  INTERVAL HISTORY: Patient agreed to video and able telemedicine visit to discuss his laboratory work and imaging results.  He continues to feel well and remains asymptomatic.  He has no issues with his colostomy.  He has no neurologic complaints.  He denies any recent fevers or illnesses.  He denies any chest pain, shortness of breath, cough, or hemoptysis.  He denies nausea, vomiting, constipation, or diarrhea. He has noted no blood or melena in his colostomy bag.  He has no urinary complaints.  Patient feels at his baseline offers no specific complaints today.  REVIEW OF SYSTEMS:   Review of Systems  Constitutional: Negative.  Negative for fever, malaise/fatigue and weight loss.  Respiratory: Negative.  Negative for cough and  shortness of breath.   Cardiovascular: Negative.  Negative for chest pain and leg swelling.  Gastrointestinal: Negative.  Negative for abdominal pain, blood in stool, constipation, diarrhea and melena.  Genitourinary: Negative.  Negative for dysuria.  Musculoskeletal: Negative.  Negative for back pain.  Skin: Negative.  Negative for rash.  Neurological: Negative.  Negative for focal weakness, weakness and headaches.  Psychiatric/Behavioral: Negative.  The patient is not nervous/anxious.     As per HPI. Otherwise, a complete review of systems is negative.  PAST MEDICAL HISTORY: Past Medical History:  Diagnosis Date   Anemia    Cancer ([place])    rectal 2015, followed by Oncology, permanent colostomy   Colostomy in place [facility])    Kidney stones    Seasonal allergies     PAST SURGICAL HISTORY: Past Surgical History:  Procedure Laterality Date   BACK SURGERY     CHOLECYSTECTOMY     COLON SURGERY     KNEE SURGERY Bilateral    PORT-A-CATH REMOVAL Right 12/02/2014   Procedure: REMOVAL PORT-A-CATH;  Surgeon: Marlyce Huge, MD;  Location: [facility] ORS;  Service: General;  Laterality: Right;   PORTACATH PLACEMENT      FAMILY HISTORY: Father with prostate cancer.  COPD, CHF.     ADVANCED DIRECTIVES:    HEALTH MAINTENANCE: Social History   Tobacco Use   Smoking status: Never Smoker   Smokeless tobacco: Current User    Types: Snuff  Substance Use Topics   Alcohol use: Yes    Alcohol/week: 0.0 standard drinks    Comment: RARE   Drug use: No     Colonoscopy:  PAP:  Bone density:  Lipid panel:  Allergies  Allergen Reactions  Sulfa Antibiotics Hives    Other reaction(s): Other (qualifier value)    Current Outpatient Medications  Medication Sig Dispense Refill   cetirizine (ZYRTEC) 10 MG tablet Take 10 mg by mouth daily.     gabapentin (NEURONTIN) 100 MG capsule Take 1 capsule once daily before bed x2 days, then take 2 capsules once daily before  bed x2 days, then may increase to 3 capsules once daily before bed if needed. 90 capsule 1   guaiFENesin (MUCINEX) 600 MG 12 hr tablet Take 1 tablet (600 mg total) by mouth 2 (two) times daily. 20 tablet 0   Loperamide HCl (IMODIUM A-D PO) Take by mouth as needed.     meloxicam (MOBIC) 15 MG tablet      Multiple Vitamin (MULTIVITAMIN) tablet Take 1 tablet by mouth daily.     Na Sulfate-K Sulfate-Mg Sulf (SUPREP BOWEL PREP KIT) 17.5-3.13-1.6 GM/177ML SOLN Take 1 kit by mouth as directed. 1 Bottle 0   NEEDLE, DISP, 18 G 18G X 1" MISC Use as directed 50 each 0   sodium chloride (OCEAN) 0.65 % SOLN nasal spray Place 1 spray into both nostrils as needed for congestion.     SYRINGE-NEEDLE, DISP, 3 ML (LUER LOCK SAFETY SYRINGES) 21G X 1-1/2" 3 ML MISC Use as directed for testosterone administration 50 each 0   tadalafil (ADCIRCA/CIALIS) 20 MG tablet Take 1 tablet (20 mg total) by mouth daily as needed for erectile dysfunction. 6 tablet 10   testosterone cypionate (DEPOTESTOSTERONE CYPIONATE) 200 MG/ML injection Inject 1 mL (200 mg total) into the muscle every 14 (fourteen) days. 10 mL 0   timolol (BETIMOL) 0.5 % ophthalmic solution Place 1 drop into the right eye daily.     timolol (TIMOPTIC) 0.5 % ophthalmic solution      No current facility-administered medications for this visit.     OBJECTIVE: There were no vitals filed for this visit.   There is no height or weight on file to calculate BMI.    ECOG FS:0 - Asymptomatic  General: Well-developed, well-nourished, no acute distress. HEENT: Normocephalic. Neuro: Alert, answering all questions appropriately. Cranial nerves grossly intact. Skin: No rashes or petechiae noted. Psych: Normal affect.  LAB RESULTS:  Lab Results  Component Value Date   NA 141 03/22/2018   K 4.6 03/22/2018   CL 106 03/22/2018   CO2 26 03/22/2018   GLUCOSE 80 03/22/2018   BUN 13 03/22/2018   CREATININE 1.20 06/13/2018   CALCIUM 9.7 03/22/2018   PROT  6.6 03/22/2018   ALBUMIN 3.9 07/03/2016   AST 17 03/22/2018   ALT 12 03/22/2018   ALKPHOS 58 07/03/2016   BILITOT 0.3 03/22/2018   GFRNONAA 66 03/22/2018   GFRAA 77 03/22/2018    Lab Results  Component Value Date   WBC 3.6 (L) 06/04/2018   NEUTROABS 2.1 06/04/2018   HGB 15.3 06/04/2018   HCT 47.7 06/04/2018   MCV 85.3 06/04/2018   PLT 311 06/04/2018     STUDIES: Ct Chest W Contrast  Result Date: 06/13/2018 CLINICAL DATA:  Restaging rectal cancer, rising CEA EXAM: CT CHEST, ABDOMEN, AND PELVIS WITH CONTRAST TECHNIQUE: Multidetector CT imaging of the chest, abdomen and pelvis was performed following the standard protocol during bolus administration of intravenous contrast. CONTRAST:  169m OMNIPAQUE IOHEXOL 300 MG/ML  SOLN COMPARISON:  CT abdomen/pelvis dated 10/29/2015 FINDINGS: CT CHEST FINDINGS Cardiovascular: Heart is normal in size.  No pericardial effusion. No evidence of thoracic aortic aneurysm. Mild atherosclerotic calcifications the aortic arch. Mild coronary  atherosclerosis of the LAD. Mediastinum/Nodes: No suspicious mediastinal lymphadenopathy. Visualized thyroid is unremarkable. Lungs/Pleura: 6.9 x 7.4 cm left upper lobe mass extending into the left suprahilar region (series 2/image 29). 8 mm nodule in the lingula (series 3/image 108). Right lung is clear. Mild centrilobular emphysematous changes. No focal consolidation. No pleural effusion or pneumothorax. Musculoskeletal: Degenerative changes of the thoracic spine. CT ABDOMEN PELVIS FINDINGS Hepatobiliary: Liver is within normal limits. No suspicious/enhancing hepatic lesions. Status post cholecystectomy. No intrahepatic or extrahepatic ductal dilatation. Pancreas: Within normal limits. Spleen: Within normal limits. Adrenals/Urinary Tract: Adrenal glands are within normal limits. 3 mm nonobstructing right lower pole renal calculus (series 2/image 85). Left kidney is within normal limits. No hydronephrosis. Bladder is mildly  thick-walled although underdistended. Stomach/Bowel: Stomach is within normal limits. No evidence of bowel obstruction. Normal appendix (series 2/image 111). Status post abdominoperineal resection with left lower quadrant colostomy. Left colonic diverticulosis, without evidence of diverticulitis. Stable postsurgical changes in the presacral space with residual soft tissue measuring 4.5 x 3.6 cm (series 2/image 122), previously 4.1 x 3.5 cm measured in a similar fashion, possibly postsurgical. Residual tumor is not excluded. Vascular/Lymphatic: No evidence of abdominal aortic aneurysm. No suspicious abdominopelvic lymphadenopathy. Reproductive: Prostate is grossly unremarkable. Other: No abdominopelvic ascites. Musculoskeletal: Mild degenerative changes of the lumbar spine. IMPRESSION: 6.9 x 7.4 cm left upper lobe mass extending to the left suprahilar region, suspicious for metastasis in this clinical setting. Additional 8 mm lingular nodule, metastasis not excluded. Status post abdominoperineal resection with left lower quadrant colostomy. Presacral soft tissue measures 4.5 x 3.6 cm, stable versus mildly increased, possibly postsurgical. Residual tumor is not excluded. Consider PET-CT for further evaluation as clinically warranted. Electronically Signed   By: Julian Hy M.D.   On: 06/13/2018 12:21   Ct Abdomen Pelvis W Contrast  Result Date: 06/13/2018 CLINICAL DATA:  Restaging rectal cancer, rising CEA EXAM: CT CHEST, ABDOMEN, AND PELVIS WITH CONTRAST TECHNIQUE: Multidetector CT imaging of the chest, abdomen and pelvis was performed following the standard protocol during bolus administration of intravenous contrast. CONTRAST:  149m OMNIPAQUE IOHEXOL 300 MG/ML  SOLN COMPARISON:  CT abdomen/pelvis dated 10/29/2015 FINDINGS: CT CHEST FINDINGS Cardiovascular: Heart is normal in size.  No pericardial effusion. No evidence of thoracic aortic aneurysm. Mild atherosclerotic calcifications the aortic arch. Mild  coronary atherosclerosis of the LAD. Mediastinum/Nodes: No suspicious mediastinal lymphadenopathy. Visualized thyroid is unremarkable. Lungs/Pleura: 6.9 x 7.4 cm left upper lobe mass extending into the left suprahilar region (series 2/image 29). 8 mm nodule in the lingula (series 3/image 108). Right lung is clear. Mild centrilobular emphysematous changes. No focal consolidation. No pleural effusion or pneumothorax. Musculoskeletal: Degenerative changes of the thoracic spine. CT ABDOMEN PELVIS FINDINGS Hepatobiliary: Liver is within normal limits. No suspicious/enhancing hepatic lesions. Status post cholecystectomy. No intrahepatic or extrahepatic ductal dilatation. Pancreas: Within normal limits. Spleen: Within normal limits. Adrenals/Urinary Tract: Adrenal glands are within normal limits. 3 mm nonobstructing right lower pole renal calculus (series 2/image 85). Left kidney is within normal limits. No hydronephrosis. Bladder is mildly thick-walled although underdistended. Stomach/Bowel: Stomach is within normal limits. No evidence of bowel obstruction. Normal appendix (series 2/image 111). Status post abdominoperineal resection with left lower quadrant colostomy. Left colonic diverticulosis, without evidence of diverticulitis. Stable postsurgical changes in the presacral space with residual soft tissue measuring 4.5 x 3.6 cm (series 2/image 122), previously 4.1 x 3.5 cm measured in a similar fashion, possibly postsurgical. Residual tumor is not excluded. Vascular/Lymphatic: No evidence of abdominal aortic aneurysm. No  suspicious abdominopelvic lymphadenopathy. Reproductive: Prostate is grossly unremarkable. Other: No abdominopelvic ascites. Musculoskeletal: Mild degenerative changes of the lumbar spine. IMPRESSION: 6.9 x 7.4 cm left upper lobe mass extending to the left suprahilar region, suspicious for metastasis in this clinical setting. Additional 8 mm lingular nodule, metastasis not excluded. Status post  abdominoperineal resection with left lower quadrant colostomy. Presacral soft tissue measures 4.5 x 3.6 cm, stable versus mildly increased, possibly postsurgical. Residual tumor is not excluded. Consider PET-CT for further evaluation as clinically warranted. Electronically Signed   By: Julian Hy M.D.   On: 06/13/2018 12:21    ASSESSMENT: Pathologic stage I rectal adenocarcinoma at the anal verge. (previously clinical stage IIIa)   PLAN:    1.  Pathologic stage I rectal adenocarcinoma at the anal verge: Patient completed neoadjuvant 5-FU and XRT followed by surgical excision on October 08, 2013. Pathologic stage as above. Previously patient declined adjuvant FOLFOX therapy. He expressed understanding of the increased risk of recurrence by foregoing adjuvant chemotherapy.  Patient CEA has started trending up and is now 8.3.  CT scan results from Jun 13, 2018 reviewed independently and reported as above with a 7.4 cm left upper lobe mass highly suspicious for metastatic recurrent disease.  Patient has been instructed to keep his previously scheduled colonoscopy on June 25, 2018.  Will also get a PET scan and referral has been sent to pulmonary for biopsy. Patient will have another video visit to discuss the results of his biopsy and treatment planning if necessary.     2. Colostomy: Continue stoma care as indicated. 3.  Leukopenia: Chronic and unchanged.  I provided 25 minutes of face-to-face video visit time during this encounter, and > 50% was spent counseling as documented under my assessment & plan.   Patient expressed understanding and was in agreement with this plan. He also understands that He can call clinic at any time with any questions, concerns, or complaints.    Lloyd Huger, MD   06/17/2018 8:06 AM

## 2018-06-14 ENCOUNTER — Encounter: Payer: Self-pay | Admitting: Oncology

## 2018-06-14 ENCOUNTER — Inpatient Hospital Stay (HOSPITAL_BASED_OUTPATIENT_CLINIC_OR_DEPARTMENT_OTHER): Payer: 59 | Admitting: Oncology

## 2018-06-14 DIAGNOSIS — C20 Malignant neoplasm of rectum: Secondary | ICD-10-CM | POA: Diagnosis not present

## 2018-06-14 DIAGNOSIS — Z933 Colostomy status: Secondary | ICD-10-CM

## 2018-06-14 DIAGNOSIS — R918 Other nonspecific abnormal finding of lung field: Secondary | ICD-10-CM

## 2018-06-14 NOTE — Progress Notes (Signed)
Doximity visit for CT results.

## 2018-06-18 ENCOUNTER — Encounter: Payer: Self-pay | Admitting: Oncology

## 2018-06-20 ENCOUNTER — Other Ambulatory Visit: Payer: 59

## 2018-06-24 ENCOUNTER — Ambulatory Visit
Admission: RE | Admit: 2018-06-24 | Discharge: 2018-06-24 | Disposition: A | Discharge status: Home / Self Care | Payer: 59 | Source: Ambulatory Visit | Attending: Oncology | Admitting: Oncology

## 2018-06-24 ENCOUNTER — Encounter: Payer: Self-pay | Admitting: Family Medicine

## 2018-06-24 ENCOUNTER — Other Ambulatory Visit: Payer: Self-pay

## 2018-06-24 DIAGNOSIS — C20 Malignant neoplasm of rectum: Secondary | ICD-10-CM | POA: Diagnosis not present

## 2018-06-24 DIAGNOSIS — R918 Other nonspecific abnormal finding of lung field: Secondary | ICD-10-CM | POA: Diagnosis not present

## 2018-06-24 LAB — GLUCOSE, CAPILLARY: Glucose-Capillary: 94 mg/dL (ref 70–99)

## 2018-06-24 MED ORDER — FLUDEOXYGLUCOSE F - 18 (FDG) INJECTION
14.3900 | Freq: Once | INTRAVENOUS | Status: AC | PRN
  Administered 2018-06-24: 14.39 via INTRAVENOUS

## 2018-06-25 ENCOUNTER — Telehealth: Payer: Self-pay | Admitting: Pulmonary Disease

## 2018-06-25 ENCOUNTER — Encounter: Payer: Self-pay | Admitting: Oncology

## 2018-06-25 NOTE — Telephone Encounter (Signed)
Called patient for COVID-19 pre-screening for in office visit.  Have you recently traveled any where out of the local area in the last 2 weeks?NO  Have you been in close contact with a person diagnosed with COVID-19 within the last 2 weeks?No Do you currently have any of the following symptoms? If so, when did they start? Cough (Yes- due to current lung issues)     Diarrhea   Joint Pain Fever      Muscle Pain   Red eyes Shortness of breath   Abdominal pain  Vomiting Loss of smell    Rash    Sore Throat Headache    Weakness   Bruising or bleeding   Okay to proceed with visit 06/25/2018

## 2018-06-26 ENCOUNTER — Encounter: Payer: Self-pay | Admitting: Oncology

## 2018-06-26 ENCOUNTER — Other Ambulatory Visit: Payer: Self-pay

## 2018-06-26 ENCOUNTER — Ambulatory Visit (INDEPENDENT_AMBULATORY_CARE_PROVIDER_SITE_OTHER): Payer: 59 | Admitting: Pulmonary Disease

## 2018-06-26 ENCOUNTER — Encounter: Payer: Self-pay | Admitting: Pulmonary Disease

## 2018-06-26 VITALS — BP 146/90 | HR 90 | Temp 97.6°F | Ht 75.0 in | Wt 272.0 lb

## 2018-06-26 DIAGNOSIS — R918 Other nonspecific abnormal finding of lung field: Secondary | ICD-10-CM | POA: Diagnosis not present

## 2018-06-26 DIAGNOSIS — C20 Malignant neoplasm of rectum: Secondary | ICD-10-CM

## 2018-06-26 DIAGNOSIS — Z933 Colostomy status: Secondary | ICD-10-CM | POA: Diagnosis not present

## 2018-06-26 MED ORDER — ALBUTEROL SULFATE HFA 108 (90 BASE) MCG/ACT IN AERS: 2.0000 | INHALATION_SPRAY | Freq: Four times a day (QID) | RESPIRATORY_TRACT | 2 refills | Status: DC | PRN

## 2018-06-26 NOTE — Patient Instructions (Signed)
1. We are scheduling a bronchoscopy for you.

## 2018-06-27 ENCOUNTER — Encounter: Payer: Self-pay | Admitting: Pulmonary Disease

## 2018-06-27 ENCOUNTER — Telehealth: Payer: Self-pay | Admitting: Gastroenterology

## 2018-06-27 ENCOUNTER — Other Ambulatory Visit: Payer: Self-pay

## 2018-06-27 ENCOUNTER — Encounter
Admission: RE | Admit: 2018-06-27 | Discharge: 2018-06-27 | Disposition: A | Discharge status: Home / Self Care | Payer: 59 | Source: Ambulatory Visit | Attending: Pulmonary Disease | Admitting: Pulmonary Disease

## 2018-06-27 ENCOUNTER — Other Ambulatory Visit
Admission: RE | Admit: 2018-06-27 | Discharge: 2018-06-27 | Disposition: A | Discharge status: Home / Self Care | Payer: 59 | Source: Ambulatory Visit | Attending: Gastroenterology | Admitting: Gastroenterology

## 2018-06-27 ENCOUNTER — Telehealth: Payer: Self-pay

## 2018-06-27 DIAGNOSIS — Z1159 Encounter for screening for other viral diseases: Secondary | ICD-10-CM | POA: Insufficient documentation

## 2018-06-27 HISTORY — DX: Personal history of urinary calculi: Z87.442

## 2018-06-27 NOTE — Telephone Encounter (Signed)
His insurance is [employer] no auth req Joellen [place]

## 2018-06-27 NOTE — Telephone Encounter (Signed)
Bronchoscopy 07/01/18 @1300  EBUS (CPT [id number], [phone]) for left upper lobe mass

## 2018-06-27 NOTE — Telephone Encounter (Signed)
Pt has cancelled his colonoscopy due to having a lung biopsy on 07/01/18. Pt stated he will call back when things settle down after that procedure.

## 2018-06-27 NOTE — Progress Notes (Signed)
Subjective:    Patient ID: [name] [name], male    DOB: 1956/01/16, 63 y.o.   MRN: [id number]  HPI This is a 63 year old lifelong never smoker, he has a history of colon cancer, presents for evaluation of a left lung mass.  He is kindly referred by Dr. Delight Hoh.  Patient has as noted above a history of stage I rectal adenocarcinoma at the anal verge diagnosed and resected in 2015.  He has a permanent colostomy.  He was treated with neoadjuvant 5-FU and radiation followed by the surgical excision as noted above.  This was performed in September 2015.  He had declined adjuvant FOLFOX therapy previously.  It was noted that the patient's CEA was started to trend up and on his most recent collection of 13 May it was noted to be 8.3.  The patient underwent CT scan of the chest abdomen and pelvis on 21 May.  The chest portion shows a very large 7.4 cm left upper lobe mass attached to the hilum.  The patient underwent PET/CT on 1 June which showed the mass to be necrotic with a hypermetabolic rim.  The patient has had no constitutional symptoms.  He has not had any fatigue, no fevers chills or sweats.  No weight loss or anorexia.  Actually feels he has gained weight.  He has not had any chest pain.  Had an episode of "bronchitis" around the latter part of February earlier part of March. He was treated at that time with prednisone and a Z-Pak.  He has noted a nonproductive cough that has lingered since then.  Subsequently he was given an albuterol inhaler which he feels that for the most part it helps him.  I have reviewed the patient's recent CT scan and PET scan with him in addition the patient's wife was participating via video conference due to the COVID-19 constraints.  She was able to view films as well.  Last medical and surgical history, social history, and family history were reviewed.  The patient is a lifelong never smoker however he does use snuff.  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  Review of Systems  Constitutional: Negative.   HENT: Negative.   Eyes: Negative.   Respiratory: Positive for cough (Nonproductive).   Cardiovascular: Negative.   Gastrointestinal: Negative.   Endocrine: Negative.   Genitourinary: Negative.   Musculoskeletal: Negative.   Skin: Negative.   Allergic/Immunologic: Negative.   Neurological: Negative.   Hematological: Negative.   Psychiatric/Behavioral: Negative.   All other systems reviewed and are negative.      Objective:   Physical Exam Constitutional:      Appearance: He is obese. He is not ill-appearing or toxic-appearing.  HENT:     Head: Normocephalic and atraumatic.     Right Ear: External ear normal.     Left Ear: External ear normal.     Nose:     Comments: Nose mouth and throat: Deferred at present due to masking  requirements for COVID-19. Eyes:     General: No scleral icterus.    Conjunctiva/sclera: Conjunctivae normal.     Pupils: Pupils are equal, round, and reactive to light.  Neck:     Musculoskeletal: Neck supple.     Thyroid: No thyromegaly.  Vascular: No JVD.     Trachea: Trachea and phonation normal.  Cardiovascular:     Rate and Rhythm: Normal rate and regular rhythm.     Pulses: Normal pulses.     Heart sounds: Normal heart sounds.  Pulmonary:     Effort: Pulmonary effort is normal. No respiratory distress.     Breath sounds: Rhonchi (Left midlung field) present. No wheezing or rales.  Abdominal:     General: Abdomen is protuberant. The ostomy site is clean. There is no distension.     Comments: Left colostomy.  Musculoskeletal: Normal range of motion.     Right lower leg: No edema.     Left lower leg: No edema.  Skin:    General: Skin is warm and dry.  Neurological:     General: No focal  deficit present.     Mental Status: He is alert and oriented to person, place, and time.  Psychiatric:        Mood and Affect: Mood normal.        Behavior: Behavior normal.    Images were reviewed as below:     PET CT:      Assessment & Plan:    1.  Left upper lobe lung mass, 7.4 cm: The mass is described as potential metastatic disease.  This is a very large mass and solitary, cannot exclude a secondary primary.  The mass is very necrotic with an FDG avid rim.  It appears that the rim with avidity to it is accessible by bronchoscopy as there is an airway leading into it.  Therefore I recommend bronchoscopy with fluoroscopy as well as Endomicroscopy to locate the best area to biopsy.  In addition recommend endobronchial ultrasound to examine the hilar area lymph nodes carefully for proper staging.  Benefits, limitations and potential complications of the procedure were discussed with the patient/family  including, but not limited to bleeding, hemoptysis, respiratory failure requiring intubation and/or prolongued mechanical ventilation, infection, pneumothorax (collapse of lung) requiring chest tube placement, stroke from air embolism or even death.  Patient understands all the above and is agreeable to proceed.  We have tentatively set the procedure up for 8 June at 1300 hrs.  2.  Rectal adenocarcinoma at the anal verge: Continue follow-up with oncology.  He has colostomy in situ and will continue stoma care as previous.   This chart was dictated using voice recognition software/Dragon.  Despite best efforts to proofread, errors can occur which can change the meaning.  Any change was purely unintentional.

## 2018-06-27 NOTE — Patient Instructions (Signed)
Your procedure is scheduled on: 07/01/18 1200 NOON Report to Day Surgery. [facility] SECOND FLOOR   Remember: Instructions that are not followed completely may result in serious medical risk,  up to and including death, or upon the discretion of your surgeon and anesthesiologist your  surgery may need to be rescheduled.     _X__ 1. Do not eat food after midnight the night before your procedure.                 No gum chewing or hard candies. You may drink clear liquids up to 2 hours                 before you are scheduled to arrive for your surgery- DO not drink clear                 liquids within 2 hours of the start of your surgery.                 Clear Liquids include:  water, Prothero juice without pulp, clear carbohydrate                 drink such as Clearfast of Gatorade, Black Coffee or Tea (Do not add                 anything to coffee or tea).  __X__2.  On the morning of surgery brush your teeth with toothpaste and water, you                may rinse your mouth with mouthwash if you wish.  Do not swallow any toothpaste of mouthwash.     _X__ 3.  No Alcohol for 24 hours before or after surgery.   _X__ 4.  Do Not Smoke or use e-cigarettes For 24 Hours Prior to Your Surgery.                 Do not use any chewable tobacco products for at least 6 hours prior to                 surgery.  ____  5.  Bring all medications with you on the day of surgery if instructed.   __X6.  Notify your doctor if there is any change in your medical condition      (cold, fever, infections).     Do not wear jewelry, make-up, hairpins, clips or nail polish. Do not wear lotions, powders, or perfumes. You may wear deodorant. Do not shave 48 hours prior to surgery. Men may shave face and neck. Do not bring valuables to the hospital.    [facility] is not responsible for any belongings or valuables.  Contacts, dentures or bridgework may not be worn into surgery. Leave your  suitcase in the car. After surgery it may be brought to your room. For patients admitted to the hospital, discharge time is determined by your treatment team.   Patients discharged the day of surgery will not be allowed to drive home.            ____ Take these medicines the morning of surgery with A SIP OF WATER:    1. NONE  2.   3.   4.  5.  6.  ____ Fleet Enema (as directed)   ____ Use CHG Soap as directed  __X__ Use inhalers on the day of surgery AND BRING  ____ Stop metformin 2 days prior to surgery    ____ Take 1/2  of usual insulin dose the night before surgery. No insulin the morning          of surgery.   ____ Stop Coumadin/Plavix/aspirin on ___X_ Stop Anti-inflammatories on  06/27/18   ____ Stop supplements until after surgery.    ____ Bring C-Pap to the hospital.

## 2018-06-27 NOTE — Telephone Encounter (Signed)
Pr is calling  To r/s his procedure fpr 07/02/18 he has another Biopsy scheduled for the 8th and he states s he will call us back when he has a day to r/s

## 2018-06-27 NOTE — H&P (View-Only) (Signed)
Subjective:    Patient ID: [name] [name], male    DOB: April 09, 1955, 63 y.o.   MRN: [id number]  HPI This is a 63 year old lifelong never smoker, he has a history of colon cancer, presents for evaluation of a left lung mass.  He is kindly referred by Dr. Delight Hoh.  Patient has as noted above a history of stage I rectal adenocarcinoma at the anal verge diagnosed and resected in 2015.  He has a permanent colostomy.  He was treated with neoadjuvant 5-FU and radiation followed by the surgical excision as noted above.  This was performed in September 2015.  He had declined adjuvant FOLFOX therapy previously.  It was noted that the patient's CEA was started to trend up and on his most recent collection of 13 May it was noted to be 8.3.  The patient underwent CT scan of the chest abdomen and pelvis on 21 May.  The chest portion shows a very large 7.4 cm left upper lobe mass attached to the hilum.  The patient underwent PET/CT on 1 June which showed the mass to be necrotic with a hypermetabolic rim.  The patient has had no constitutional symptoms.  He has not had any fatigue, no fevers chills or sweats.  No weight loss or anorexia.  Actually feels he has gained weight.  He has not had any chest pain.  Had an episode of "bronchitis" around the latter part of February earlier part of March. He was treated at that time with prednisone and a Z-Pak.  He has noted a nonproductive cough that has lingered since then.  Subsequently he was given an albuterol inhaler which he feels that for the most part it helps him.  I have reviewed the patient's recent CT scan and PET scan with him in addition the patient's wife was participating via video conference due to the COVID-19 constraints.  She was able to view films as well.  Last medical and surgical history, social history, and family history were reviewed.  The patient is a lifelong never smoker however he does use snuff.  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  Review of Systems  Constitutional: Negative.   HENT: Negative.   Eyes: Negative.   Respiratory: Positive for cough (Nonproductive).   Cardiovascular: Negative.   Gastrointestinal: Negative.   Endocrine: Negative.   Genitourinary: Negative.   Musculoskeletal: Negative.   Skin: Negative.   Allergic/Immunologic: Negative.   Neurological: Negative.   Hematological: Negative.   Psychiatric/Behavioral: Negative.   All other systems reviewed and are negative.      Objective:   Physical Exam Constitutional:      Appearance: He is obese. He is not ill-appearing or toxic-appearing.  HENT:     Head: Normocephalic and atraumatic.     Right Ear: External ear normal.     Left Ear: External ear normal.     Nose:     Comments: Nose mouth and throat: Deferred at present due to masking  requirements for COVID-19. Eyes:     General: No scleral icterus.    Conjunctiva/sclera: Conjunctivae normal.     Pupils: Pupils are equal, round, and reactive to light.  Neck:     Musculoskeletal: Neck supple.     Thyroid: No thyromegaly.  Vascular: No JVD.     Trachea: Trachea and phonation normal.  Cardiovascular:     Rate and Rhythm: Normal rate and regular rhythm.     Pulses: Normal pulses.     Heart sounds: Normal heart sounds.  Pulmonary:     Effort: Pulmonary effort is normal. No respiratory distress.     Breath sounds: Rhonchi (Left midlung field) present. No wheezing or rales.  Abdominal:     General: Abdomen is protuberant. The ostomy site is clean. There is no distension.     Comments: Left colostomy.  Musculoskeletal: Normal range of motion.     Right lower leg: No edema.     Left lower leg: No edema.  Skin:    General: Skin is warm and dry.  Neurological:     General: No focal  deficit present.     Mental Status: He is alert and oriented to person, place, and time.  Psychiatric:        Mood and Affect: Mood normal.        Behavior: Behavior normal.    Images were reviewed as below:     PET CT:      Assessment & Plan:    1.  Left upper lobe lung mass, 7.4 cm: The mass is described as potential metastatic disease.  This is a very large mass and solitary, cannot exclude a secondary primary.  The mass is very necrotic with an FDG avid rim.  It appears that the rim with avidity to it is accessible by bronchoscopy as there is an airway leading into it.  Therefore I recommend bronchoscopy with fluoroscopy as well as Endomicroscopy to locate the best area to biopsy.  In addition recommend endobronchial ultrasound to examine the hilar area lymph nodes carefully for proper staging.  Benefits, limitations and potential complications of the procedure were discussed with the patient/family  including, but not limited to bleeding, hemoptysis, respiratory failure requiring intubation and/or prolongued mechanical ventilation, infection, pneumothorax (collapse of lung) requiring chest tube placement, stroke from air embolism or even death.  Patient understands all the above and is agreeable to proceed.  We have tentatively set the procedure up for 8 June at 1300 hrs.  2.  Rectal adenocarcinoma at the anal verge: Continue follow-up with oncology.  He has colostomy in situ and will continue stoma care as previous.   This chart was dictated using voice recognition software/Dragon.  Despite best efforts to proofread, errors can occur which can change the meaning.  Any change was purely unintentional.

## 2018-06-28 LAB — NOVEL CORONAVIRUS, NAA (HOSP ORDER, SEND-OUT TO REF LAB; TAT 18-24 HRS): SARS-CoV-2, NAA: NOT DETECTED

## 2018-07-01 ENCOUNTER — Ambulatory Visit: Payer: 59 | Admitting: Anesthesiology

## 2018-07-01 ENCOUNTER — Other Ambulatory Visit: Payer: Self-pay

## 2018-07-01 ENCOUNTER — Encounter: Payer: Self-pay | Admitting: *Deleted

## 2018-07-01 ENCOUNTER — Encounter
Admission: RE | Disposition: A | Discharge status: Home / Self Care | Payer: Self-pay | Source: Home / Self Care | Attending: Pulmonary Disease

## 2018-07-01 ENCOUNTER — Ambulatory Visit
Admission: RE | Admit: 2018-07-01 | Discharge: 2018-07-01 | Disposition: A | Discharge status: Home / Self Care | Payer: 59 | Attending: Pulmonary Disease | Admitting: Pulmonary Disease

## 2018-07-01 ENCOUNTER — Ambulatory Visit: Payer: 59

## 2018-07-01 DIAGNOSIS — R590 Localized enlarged lymph nodes: Secondary | ICD-10-CM

## 2018-07-01 DIAGNOSIS — C3412 Malignant neoplasm of upper lobe, left bronchus or lung: Secondary | ICD-10-CM | POA: Insufficient documentation

## 2018-07-01 DIAGNOSIS — Z933 Colostomy status: Secondary | ICD-10-CM | POA: Insufficient documentation

## 2018-07-01 DIAGNOSIS — F17290 Nicotine dependence, other tobacco product, uncomplicated: Secondary | ICD-10-CM | POA: Insufficient documentation

## 2018-07-01 DIAGNOSIS — J984 Other disorders of lung: Secondary | ICD-10-CM | POA: Insufficient documentation

## 2018-07-01 DIAGNOSIS — B49 Unspecified mycosis: Secondary | ICD-10-CM | POA: Insufficient documentation

## 2018-07-01 DIAGNOSIS — Z923 Personal history of irradiation: Secondary | ICD-10-CM | POA: Diagnosis not present

## 2018-07-01 DIAGNOSIS — Z6834 Body mass index (BMI) 34.0-34.9, adult: Secondary | ICD-10-CM | POA: Diagnosis not present

## 2018-07-01 DIAGNOSIS — R918 Other nonspecific abnormal finding of lung field: Secondary | ICD-10-CM

## 2018-07-01 DIAGNOSIS — C7802 Secondary malignant neoplasm of left lung: Secondary | ICD-10-CM | POA: Diagnosis not present

## 2018-07-01 DIAGNOSIS — Z85038 Personal history of other malignant neoplasm of large intestine: Secondary | ICD-10-CM | POA: Diagnosis not present

## 2018-07-01 HISTORY — PX: ENDOBRONCHIAL ULTRASOUND: SHX5096

## 2018-07-01 SURGERY — ENDOBRONCHIAL ULTRASOUND (EBUS)
Anesthesia: General

## 2018-07-01 MED ORDER — BUTAMBEN-TETRACAINE-BENZOCAINE 2-2-14 % EX AERO
1.0000 | INHALATION_SPRAY | Freq: Once | CUTANEOUS | Status: DC
  Filled 2018-07-01: qty 20

## 2018-07-01 MED ORDER — LACTATED RINGERS IV SOLN
INTRAVENOUS | Status: DC
  Administered 2018-07-01: 13:00:00 via INTRAVENOUS

## 2018-07-01 MED ORDER — ONDANSETRON HCL 4 MG/2ML IJ SOLN
INTRAMUSCULAR | Status: DC | PRN
  Administered 2018-07-01: 4 mg via INTRAVENOUS

## 2018-07-01 MED ORDER — MIDAZOLAM HCL 2 MG/2ML IJ SOLN
INTRAMUSCULAR | Status: DC | PRN
  Administered 2018-07-01: 2 mg via INTRAVENOUS

## 2018-07-01 MED ORDER — FENTANYL CITRATE (PF) 100 MCG/2ML IJ SOLN
INTRAMUSCULAR | Status: DC | PRN
  Administered 2018-07-01: 100 ug via INTRAVENOUS

## 2018-07-01 MED ORDER — SEVOFLURANE IN SOLN
RESPIRATORY_TRACT | Status: AC
  Filled 2018-07-01: qty 250

## 2018-07-01 MED ORDER — MIDAZOLAM HCL 2 MG/2ML IJ SOLN
INTRAMUSCULAR | Status: AC
  Filled 2018-07-01: qty 2

## 2018-07-01 MED ORDER — FAMOTIDINE 20 MG PO TABS
20.0000 mg | ORAL_TABLET | Freq: Once | ORAL | Status: AC
  Administered 2018-07-01: 20 mg via ORAL

## 2018-07-01 MED ORDER — FENTANYL CITRATE (PF) 100 MCG/2ML IJ SOLN
INTRAMUSCULAR | Status: AC
  Filled 2018-07-01: qty 2

## 2018-07-01 MED ORDER — GLYCOPYRROLATE 0.2 MG/ML IJ SOLN
INTRAMUSCULAR | Status: DC | PRN
  Administered 2018-07-01: 0.2 mg via INTRAVENOUS

## 2018-07-01 MED ORDER — SUGAMMADEX SODIUM 500 MG/5ML IV SOLN
INTRAVENOUS | Status: AC
  Filled 2018-07-01: qty 5

## 2018-07-01 MED ORDER — GLYCOPYRROLATE 0.2 MG/ML IJ SOLN
INTRAMUSCULAR | Status: AC
  Filled 2018-07-01: qty 1

## 2018-07-01 MED ORDER — FENTANYL CITRATE (PF) 100 MCG/2ML IJ SOLN: 25.0000 ug | INTRAMUSCULAR | Status: DC | PRN

## 2018-07-01 MED ORDER — SUCCINYLCHOLINE CHLORIDE 20 MG/ML IJ SOLN
INTRAMUSCULAR | Status: DC | PRN
  Administered 2018-07-01: 1000 mg via INTRAVENOUS

## 2018-07-01 MED ORDER — ROCURONIUM BROMIDE 100 MG/10ML IV SOLN
INTRAVENOUS | Status: DC | PRN
  Administered 2018-07-01: 30 mg via INTRAVENOUS
  Administered 2018-07-01: 20 mg via INTRAVENOUS

## 2018-07-01 MED ORDER — DEXAMETHASONE SODIUM PHOSPHATE 4 MG/ML IJ SOLN
INTRAMUSCULAR | Status: AC
  Filled 2018-07-01: qty 2

## 2018-07-01 MED ORDER — PROPOFOL 10 MG/ML IV BOLUS
INTRAVENOUS | Status: AC
  Filled 2018-07-01: qty 20

## 2018-07-01 MED ORDER — SUGAMMADEX SODIUM 500 MG/5ML IV SOLN
INTRAVENOUS | Status: DC | PRN
  Administered 2018-07-01: 250 mg via INTRAVENOUS

## 2018-07-01 MED ORDER — FAMOTIDINE 20 MG PO TABS
ORAL_TABLET | ORAL | Status: AC
  Filled 2018-07-01: qty 1

## 2018-07-01 MED ORDER — PROPOFOL 10 MG/ML IV BOLUS
INTRAVENOUS | Status: DC | PRN
  Administered 2018-07-01: 50 mg via INTRAVENOUS
  Administered 2018-07-01: 150 mg via INTRAVENOUS

## 2018-07-01 MED ORDER — ONDANSETRON HCL 4 MG/2ML IJ SOLN
INTRAMUSCULAR | Status: AC
  Filled 2018-07-01: qty 2

## 2018-07-01 MED ORDER — DEXAMETHASONE SODIUM PHOSPHATE 10 MG/ML IJ SOLN
INTRAMUSCULAR | Status: DC | PRN
  Administered 2018-07-01: 8 mg via INTRAVENOUS

## 2018-07-01 MED ORDER — SODIUM CHLORIDE 0.9 % IV SOLN: Freq: Once | INTRAVENOUS | Status: DC

## 2018-07-01 MED FILL — TADALAFIL 20 MG TABS: 20 | 30 days supply | Qty: 6 | Fill #1

## 2018-07-01 NOTE — Discharge Instructions (Signed)
Results will be called to you when ready. If you have any questions, please call Dr. Patsey Berthold, [phone] or Dr. Grayland Ormond, [phone]   Flexible Bronchoscopy, Care After This sheet gives you information about how to care for yourself after your test. Your doctor may also give you more specific instructions. If you have problems or questions, contact your doctor. Follow these instructions at home: Eating and drinking  Do not eat or drink anything (not even water) for 2 hours after your test, or until your numbing medicine (local anesthetic) wears off.  When your numbness is gone and your cough and gag reflexes have come back, you may: ? Eat only soft foods. ? Slowly drink liquids.  The day after the test, go back to your normal diet. Driving  Do not drive for 24 hours if you were given a medicine to help you relax (sedative).  Do not drive or use heavy machinery while taking prescription pain medicine. General instructions   Take over-the-counter and prescription medicines only as told by your doctor.  Return to your normal activities as told. Ask what activities are safe for you.  Do not use any products that have nicotine or tobacco in them. This includes cigarettes and e-cigarettes. If you need help quitting, ask your doctor.  Keep all follow-up visits as told by your doctor. This is important. It is very important if you had a tissue sample (biopsy) taken. Get help right away if:  You have shortness of breath that gets worse.  You get light-headed.  You feel like you are going to pass out (faint).  You have chest pain.  You cough up: ? More than a little blood. ? More blood than before. Summary  Do not eat or drink anything (not even water) for 2 hours after your test, or until your numbing medicine wears off.  Do not use cigarettes. Do not use e-cigarettes.  Get help right away if you have chest pain. This information is not intended to replace advice given  to you by your health care provider. Make sure you discuss any questions you have with your health care provider. Document Released: 11/06/2008 Document Revised: 01/28/2016 Document Reviewed: 01/28/2016 Elsevier Interactive Patient Education  2019 [place]   1) The drugs that you were given will stay in your system until tomorrow so for the next 24 hours you should not:  A) Drive an automobile B) Make any legal decisions C) Drink any alcoholic beverage   2) You may resume regular meals tomorrow.  Today it is better to start with liquids and gradually work up to solid foods.  You may eat anything you prefer, but it is better to start with liquids, then soup and crackers, and gradually work up to solid foods.   3) Please notify your doctor immediately if you have any unusual bleeding, trouble breathing, redness and pain at the surgery site, drainage, fever, or pain not relieved by medication.    4) Additional Instructions: We will call you with biopsy results and 2 to 3 days.  Do call the office if you have any persistent fever after the procedure.  You should not do any strenuous activities for at least 24 hours.        Please contact your physician with any problems or Same Day Surgery at [phone], Monday through Friday 6 am to 4 pm, or [place] at [facility] number at [phone].

## 2018-07-01 NOTE — Op Note (Addendum)
PROCEDURE:  1.  BRONCHOSCOPY WITH BRONCHIAL BIOPSIES  2.  ENDOBRONCHIAL ULTRASOUND with TBNA   PROCEDURE DATE: 07/01/2018  TIME:  NAME:  [name] [name]  DOB:01-13-1956  MRN: [id number] LOC:  ARPO/None    HOSP DAY: _0 @ CODE STATUS:    Full    Indications/Preliminary Diagnosis: 63 year old lifelong never smoker with a large left hilar mass.  Previous history of rectal adenocarcinoma (stage I, 2015).  Evaluation of cough led to imaging which revealed left hilar mass 7.4 cm in diameter.  Procedure being performed for tissue diagnosis.  Rule out secondary primary versus metastatic disease.  Consent: (Place X beside choice/s below)  The benefits, risks and possible complications of the procedure were        explained to:  _X__ patient  ___ patient's family  ___ other:___________  who verbalized understanding and gave:  _X__ verbal  _X__ written  ___ verbal and written  ___ telephone  ___ other:________ consent.      Unable to obtain consent; procedure performed on emergent basis.     Other:       Consent: Benefits, limitations and potential complications of the procedure were discussed with the patient/family  including, but not limited to bleeding, hemoptysis, respiratory failure requiring intubation and/or prolongued mechanical ventilation, infection, pneumothorax (collapse of lung) requiring chest tube placement, stroke from air embolism or even death.   Anesthesia type: General endotracheal, see anesthesiology records   Insertion Route (Place X beside choice below)   Nasal   Oral  X Endotracheal Tube   Tracheostomy   INTRAPROCEDURE MEDICATIONS: SEE ANESTHESIOLOGY RECORDS   PROCEDURE DETAILS: Timeout performed and correct patient, name, & ID confirmed.  Patient was inducted under general anesthesia and intubated with a #9 ET tube by CRNA/anesthesiologist.  A Portex adapter was placed on the ET tube.  Once the patient was under adequate general anesthesia, the  Olympus video bronchoscope was advanced into the airway via the Portex adapter and endotracheal tube.  The visible portions of the trachea were normal.  Carina was somewhat full.  The bronchoscope was advanced into the right tracheobronchial tree.  The right upper lobe was bifurcated (normal variant).  The right tracheobronchial tree was examined, no endobronchial lesions were noted.  Anatomy was normal.  At this point the bronchoscope was brought into the left mainstem bronchus.  The left lower lobe bronchus and sub-segments were without lesions.  The bronchoscope was brought back to the attention to the left upper lobe and lingula.  The left upper lobe bronchus orifice showed an exophytic mass that appeared necrotic and friable.  There was pearlescent debris noted.  At this point the area was blanched with 3 mL's of 1-10,000 solution of epinephrine.  Bronchial brushings were performed x4.  ROSE was consistent with malignant cells.  Once the brushings were accomplished, bronchial biopsies were performed x8.  These were placed in formalin for tissue examination.  After biopsies bronchoalveolar lavage was performed 30 mL's of saline was instilled obtaining 10 mL's of aliquot.  At this point the bronchoscope was pulled back to the main carina.  Secondary airway examination was performed to ensure good hemostasis.  Having ensured this, bronchoscope was then replaced for a Olympus endobronchial ultrasound scope.  The scope was advanced via the Portex adapter through the ET tube.  The precarinal, subcarinal and left hilar areas were examined.  There was bulky tissue/adenopathy noted in the left subcarinal space. Three passes with a 21-gauge EBUS needle were performed and the material placed  in [place] for analysis.  Once this was completed the patient received 9 mL's of 1% lidocaine via bronchial lavage and the bronchoscope was retrieved the procedure was then terminated.  Patient was allowed to emerge from general  anesthesia and was transferred to the PACU in satisfactory condition.  SPECIMENS (Sites): (Place X beside choice below)  Specimens Description   No Specimens Obtained     Washings   X Lavage  30 mL's saline in; 10 mL's aliquot LUL  X Biopsies  x8 LUL  X TBNA  x3 passes left subcarinal space,node  X Brushings  x4 LUL   Sputum    FINDINGS: 1) Exophytic/necrotic mass in the left upper lobe, carcinoma until proven otherwise 2) Subcarinal space adenopathy/mass.  ESTIMATED BLOOD LOSS: Less than 2 mL's COMPLICATIONS/RESOLUTION: None.    IMPRESSION:POST-PROCEDURE DX: 1.  Left upper lobe necrotic mass consistent with carcinoma, await pathology. 2.  Normal variant of the right upper lobe bifurcated rather than trifurcated. 3.  Subcarinal space adenopathy/mass.   RECOMMENDATION/PLAN:  Follow up Pathology Reports Will discuss with referring oncologist.     Renold Don, MD [facility]  Advanced Bronchoscopy

## 2018-07-01 NOTE — Anesthesia Procedure Notes (Signed)
Procedure Name: Intubation Date/Time: 07/01/2018 1:14 PM Performed by: Jonna Clark, CRNA Pre-anesthesia Checklist: Patient identified, Patient being monitored, Timeout performed, Emergency Drugs available and Suction available Patient Re-evaluated:Patient Re-evaluated prior to induction Oxygen Delivery Method: Circle system utilized Preoxygenation: Pre-oxygenation with 100% oxygen Induction Type: IV induction and Cricoid Pressure applied Ventilation: Mask ventilation without difficulty and Two handed mask ventilation required Laryngoscope Size: McGraph and 4 Grade View: Grade II Tube type: Oral Tube size: 9.0 mm Number of attempts: 2 Airway Equipment and Method: Stylet Placement Confirmation: ETT inserted through vocal cords under direct vision,  positive ETCO2 and breath sounds checked- equal and bilateral Secured at: 22 cm Tube secured with: Tape Dental Injury: Teeth and Oropharynx as per pre-operative assessment  Difficulty Due To: Difficult Airway- due to anterior larynx and Difficulty was anticipated Future Recommendations: Recommend- induction with short-acting agent, and alternative techniques readily available

## 2018-07-01 NOTE — Transfer of Care (Signed)
Immediate Anesthesia Transfer of Care Note  Patient: [name] [name]  Procedure(s) Performed: ENDOBRONCHIAL ULTRASOUND (N/A )  Patient Location: PACU  Anesthesia Type:General  Level of Consciousness: drowsy and patient cooperative  Airway & Oxygen Therapy: Patient Spontanous Breathing and Patient connected to face mask oxygen  Post-op Assessment: Report given to RN and Post -op Vital signs reviewed and stable  Post vital signs: Reviewed and stable  Last Vitals:  Vitals Value Taken Time  BP 150/79 07/01/2018  2:01 PM  Temp 36.5 C 07/01/2018  2:00 PM  Pulse 95 07/01/2018  2:04 PM  Resp 21 07/01/2018  2:04 PM  SpO2 98 % 07/01/2018  2:04 PM  Vitals shown include unvalidated device data.  Last Pain:  Vitals:   07/01/18 1222  TempSrc: Oral  PainSc: 0-No pain         Complications: No apparent anesthesia complications

## 2018-07-01 NOTE — Interval H&P Note (Signed)
History and Physical Interval Note:  07/01/2018 12:20 PM  [name] [name]  has presented today for surgery, with the diagnosis of LEFT UPPER LOBE MASS.  The various methods of treatment have been discussed with the patient and family. After consideration of risks, benefits and other options for treatment, the patient has consented to  Procedure(s): ENDOBRONCHIAL ULTRASOUND (N/A) , bronchoscopy with fluoroscopy and biopsies as a surgical intervention.  The patient's history has been reviewed, patient examined, no change in status, stable for surgery.  I have reviewed the patient's chart and labs.  Questions were answered to the patient's satisfaction.     Renold Don, MD [facility]

## 2018-07-01 NOTE — Anesthesia Post-op Follow-up Note (Signed)
Anesthesia QCDR form completed.        

## 2018-07-01 NOTE — Anesthesia Preprocedure Evaluation (Signed)
Anesthesia Evaluation  Patient identified by MRN, date of birth, ID band Patient awake    Reviewed: Allergy & Precautions, H&P , NPO status , Patient's Chart, lab work & pertinent test results  Airway Mallampati: III  TM Distance: <3 FB Neck ROM: full   Comment: Small chin Large neck Dental  (+) Chipped   Pulmonary shortness of breath,  7 cm necrotic lung mass          Cardiovascular (-) angina(-) Past MI and (-) Cardiac Stents negative cardio ROS  (-) dysrhythmias      Neuro/Psych negative neurological ROS  negative psych ROS   GI/Hepatic Neg liver ROS, H/o rectal adenocarcinoma   Endo/Other  negative endocrine ROS  Renal/GU      Musculoskeletal   Abdominal   Peds  Hematology  (+) Blood dyscrasia, anemia ,   Anesthesia Other Findings Past Medical History: No date: Anemia No date: Cancer [facility])     Comment:  rectal 2015, followed by Oncology, permanent colostomy No date: Colostomy in place [facility]) No date: History of kidney stones No date: Seasonal allergies  Past Surgical History: No date: BACK SURGERY No date: CHOLECYSTECTOMY No date: COLON SURGERY No date: KNEE SURGERY; Bilateral 12/02/2014: PORT-A-CATH REMOVAL; Right     Comment:  Procedure: REMOVAL PORT-A-CATH;  Surgeon: Marlyce Huge, MD;  Location: [facility] ORS;  Service: General;                Laterality: Right; No date: PORTACATH PLACEMENT  BMI    Body Mass Index:  34.00 kg/m      Reproductive/Obstetrics negative OB ROS                             Anesthesia Physical Anesthesia Plan  ASA: III  Anesthesia Plan: General ETT   Post-op Pain Management:    Induction:   PONV Risk Score and Plan: Ondansetron, Dexamethasone, Midazolam and Treatment may vary due to age or medical condition  Airway Management Planned:   Additional Equipment:   Intra-op Plan:   Post-operative Plan:    Informed Consent: I have reviewed the patients History and Physical, chart, labs and discussed the procedure including the risks, benefits and alternatives for the proposed anesthesia with the patient or authorized representative who has indicated his/her understanding and acceptance.     Dental Advisory Given  Plan Discussed with: Anesthesiologist and CRNA  Anesthesia Plan Comments:         Anesthesia Quick Evaluation

## 2018-07-02 ENCOUNTER — Encounter: Admission: RE | Payer: Self-pay | Source: Home / Self Care

## 2018-07-02 ENCOUNTER — Ambulatory Visit: Admission: RE | Admit: 2018-07-02 | Payer: 59 | Source: Home / Self Care | Admitting: Gastroenterology

## 2018-07-02 ENCOUNTER — Encounter: Payer: Self-pay | Admitting: Pulmonary Disease

## 2018-07-02 SURGERY — COLONOSCOPY WITH PROPOFOL
Anesthesia: General

## 2018-07-02 NOTE — Anesthesia Postprocedure Evaluation (Signed)
Anesthesia Post Note  Patient: [name] [name]  Procedure(s) Performed: ENDOBRONCHIAL ULTRASOUND (N/A )  Patient location during evaluation: PACU Anesthesia Type: General Level of consciousness: awake and alert Pain management: pain level controlled Vital Signs Assessment: post-procedure vital signs reviewed and stable Respiratory status: spontaneous breathing, nonlabored ventilation and respiratory function stable Cardiovascular status: blood pressure returned to baseline and stable Postop Assessment: no apparent nausea or vomiting Anesthetic complications: no     Last Vitals:  Vitals:   07/01/18 1448 07/01/18 1533  BP:  128/67  Pulse:  81  Resp:  16  Temp:  36.8 C  SpO2: 93% 96%    Last Pain:  Vitals:   07/02/18 0859  TempSrc:   PainSc: 0-No pain                 Durenda Hurt

## 2018-07-03 ENCOUNTER — Telehealth: Payer: Self-pay

## 2018-07-03 MED ORDER — HYDROCOD POLST-CPM POLST ER 10-8 MG/5ML PO SUER: 5.0000 mL | Freq: Two times a day (BID) | ORAL | 0 refills | Status: DC | PRN

## 2018-07-03 NOTE — Telephone Encounter (Signed)
Spoke to patient, let him know Dr. Patsey Berthold sent in some Tussinex for him. Patient aware.

## 2018-07-04 ENCOUNTER — Encounter: Payer: Self-pay | Admitting: Oncology

## 2018-07-04 ENCOUNTER — Other Ambulatory Visit: Payer: Self-pay | Admitting: Pathology

## 2018-07-04 LAB — CYTOLOGY - NON PAP

## 2018-07-04 LAB — SURGICAL PATHOLOGY

## 2018-07-05 ENCOUNTER — Telehealth (HOSPITAL_BASED_OUTPATIENT_CLINIC_OR_DEPARTMENT_OTHER): Payer: 59 | Admitting: Oncology

## 2018-07-05 ENCOUNTER — Encounter: Payer: Self-pay | Admitting: Oncology

## 2018-07-05 DIAGNOSIS — C20 Malignant neoplasm of rectum: Secondary | ICD-10-CM

## 2018-07-05 DIAGNOSIS — R918 Other nonspecific abnormal finding of lung field: Secondary | ICD-10-CM | POA: Diagnosis not present

## 2018-07-05 DIAGNOSIS — Z933 Colostomy status: Secondary | ICD-10-CM

## 2018-07-05 DIAGNOSIS — D72819 Decreased white blood cell count, unspecified: Secondary | ICD-10-CM | POA: Diagnosis not present

## 2018-07-06 NOTE — Progress Notes (Signed)
[place]  Telephone:([phone]) [phone] [phone]) [phone]  ID: [name] [name] OB: 02-Sep-1955  MR#: [id number]  AOZ#:[id number]  Patient Care Team: [name] Hartshorn, FNP as PCP - General (Family Medicine) [name] Huge, MD as Attending Physician (Surgery) [name] Huger, MD as Consulting Physician (Oncology) [name] Sons, MD (Urology) [name] Bear, MD as Consulting Physician (Ophthalmology) [name] Lipps, MD as Consulting Physician (Pulmonary Disease)  I connected with [name] [name] on 07/06/18 at  1:30 PM EDT by video enabled telemedicine visit and verified that I am speaking with the correct person using two identifiers.   I discussed the limitations, risks, security and privacy concerns of performing an evaluation and management service by telemedicine and the availability of in-person appointments. I also discussed with the patient that there may be a patient responsible charge related to this service. The patient expressed understanding and agreed to proceed.   Other persons participating in the visit and their role in the encounter: Patient, MD  Patients location: Home Providers location: Clinic   CHIEF COMPLAINT: Recurrent, stage IV rectal adenocarcinoma.  INTERVAL HISTORY: Patient agreed to video enabled telemedicine visit to discuss his pathology results and treatment planning.  He continues to feel well and remains asymptomatic.  He has no issues with his colostomy.  He has no neurologic complaints.  He denies any recent fevers or illnesses.  He denies any chest pain, shortness of breath, cough, or hemoptysis.  He denies nausea, vomiting, constipation, or diarrhea. He has noted no blood or melena in his colostomy bag.  He has no urinary complaints.  Patient offers no specific complaints today.  REVIEW OF SYSTEMS:   Review of Systems  Constitutional: Negative.  Negative for fever, malaise/fatigue and weight loss.  Respiratory: Negative.   Negative for cough and shortness of breath.   Cardiovascular: Negative.  Negative for chest pain and leg swelling.  Gastrointestinal: Negative.  Negative for abdominal pain, blood in stool, constipation, diarrhea and melena.  Genitourinary: Negative.  Negative for dysuria.  Musculoskeletal: Negative.  Negative for back pain.  Skin: Negative.  Negative for rash.  Neurological: Negative.  Negative for focal weakness, weakness and headaches.  Psychiatric/Behavioral: Negative.  The patient is not nervous/anxious.     As per HPI. Otherwise, a complete review of systems is negative.  PAST MEDICAL HISTORY: Past Medical History:  Diagnosis Date   Anemia    Cancer ([place])    rectal 2015, followed by Oncology, permanent colostomy   Colostomy in place [facility])    History of kidney stones    Seasonal allergies     PAST SURGICAL HISTORY: Past Surgical History:  Procedure Laterality Date   BACK SURGERY     CHOLECYSTECTOMY     COLON SURGERY     ENDOBRONCHIAL ULTRASOUND N/A 07/01/2018   Procedure: ENDOBRONCHIAL ULTRASOUND;  Surgeon: [name] Pita, MD;  Location: [facility] ORS;  Service: Cardiopulmonary;  Laterality: N/A;   KNEE SURGERY Bilateral    PORT-A-CATH REMOVAL Right 12/02/2014   Procedure: REMOVAL PORT-A-CATH;  Surgeon: [name] Huge, MD;  Location: [facility] ORS;  Service: General;  Laterality: Right;   PORTACATH PLACEMENT      FAMILY HISTORY: Father with prostate cancer.  COPD, CHF.     ADVANCED DIRECTIVES:    HEALTH MAINTENANCE: Social History   Tobacco Use   Smoking status: Never Smoker   Smokeless tobacco: Current User    Types: Snuff   Tobacco comment: might smoke a cigar once every 3 months  Substance Use Topics  Alcohol use: Yes    Alcohol/week: 0.0 standard drinks    Comment: RARE   Drug use: No     Colonoscopy:  PAP:  Bone density:  Lipid panel:  Allergies  Allergen Reactions   Sulfa Antibiotics Hives    Other reaction(s): Other  (qualifier value)    Current Outpatient Medications  Medication Sig Dispense Refill   albuterol (VENTOLIN HFA) 108 (90 Base) MCG/ACT inhaler Inhale 2 puffs into the lungs every 6 (six) hours as needed for wheezing or shortness of breath (cough). 1 Inhaler 2   cetirizine (ZYRTEC) 10 MG tablet Take 10 mg by mouth at bedtime.      chlorpheniramine-HYDROcodone (TUSSIONEX PENNKINETIC ER) 10-8 MG/5ML SUER Take 5 mLs by mouth every 12 (twelve) hours as needed for cough. 200 mL 0   gabapentin (NEURONTIN) 100 MG capsule Take 1 capsule once daily before bed x2 days, then take 2 capsules once daily before bed x2 days, then may increase to 3 capsules once daily before bed if needed. (Patient taking differently: Take 200 mg by mouth at bedtime. take 2 capsules once daily before bed x2 days, then may increase to 3 capsules once daily before bed if needed.) 90 capsule 1   guaiFENesin (MUCINEX) 600 MG 12 hr tablet Take 1 tablet (600 mg total) by mouth 2 (two) times daily. (Patient taking differently: Take 600 mg by mouth 2 (two) times daily as needed for cough or to loosen phlegm. ) 20 tablet 0   loperamide (IMODIUM A-D) 2 MG capsule Take 2 mg by mouth as needed for diarrhea or loose stools.      meloxicam (MOBIC) 15 MG tablet Take 15 mg by mouth daily as needed for pain.      Multiple Vitamin (MULTIVITAMIN) tablet Take 1 tablet by mouth daily.     NEEDLE, DISP, 18 G 18G X 1" MISC Use as directed 50 each 0   sodium chloride (OCEAN) 0.65 % SOLN nasal spray Place 1 spray into both nostrils as needed for congestion.     SYRINGE-NEEDLE, DISP, 3 ML (LUER LOCK SAFETY SYRINGES) 21G X 1-1/2" 3 ML MISC Use as directed for testosterone administration 50 each 0   tadalafil (ADCIRCA/CIALIS) 20 MG tablet Take 1 tablet (20 mg total) by mouth daily as needed for erectile dysfunction. 6 tablet 10   testosterone cypionate (DEPOTESTOSTERONE CYPIONATE) 200 MG/ML injection Inject 1 mL (200 mg total) into the muscle every  14 (fourteen) days. 10 mL 0   timolol (BETIMOL) 0.5 % ophthalmic solution Place 1 drop into the right eye at bedtime.      No current facility-administered medications for this visit.     OBJECTIVE: There were no vitals filed for this visit.   There is no height or weight on file to calculate BMI.    ECOG FS:0 - Asymptomatic  General: Well-developed, well-nourished, no acute distress. HEENT: Normocephalic. Neuro: Alert, answering all questions appropriately. Cranial nerves grossly intact. Skin: No rashes or petechiae noted. Psych: Normal affect.  LAB RESULTS:  Lab Results  Component Value Date   NA 141 03/22/2018   K 4.6 03/22/2018   CL 106 03/22/2018   CO2 26 03/22/2018   GLUCOSE 80 03/22/2018   BUN 13 03/22/2018   CREATININE 1.20 06/13/2018   CALCIUM 9.7 03/22/2018   PROT 6.6 03/22/2018   ALBUMIN 3.9 07/03/2016   AST 17 03/22/2018   ALT 12 03/22/2018   ALKPHOS 58 07/03/2016   BILITOT 0.3 03/22/2018   GFRNONAA 66 03/22/2018  GFRAA 77 03/22/2018    Lab Results  Component Value Date   WBC 3.6 (L) 06/04/2018   NEUTROABS 2.1 06/04/2018   HGB 15.3 06/04/2018   HCT 47.7 06/04/2018   MCV 85.3 06/04/2018   PLT 311 06/04/2018     STUDIES: Ct Chest W Contrast  Result Date: 06/13/2018 CLINICAL DATA:  Restaging rectal cancer, rising CEA EXAM: CT CHEST, ABDOMEN, AND PELVIS WITH CONTRAST TECHNIQUE: Multidetector CT imaging of the chest, abdomen and pelvis was performed following the standard protocol during bolus administration of intravenous contrast. CONTRAST:  183mL OMNIPAQUE IOHEXOL 300 MG/ML  SOLN COMPARISON:  CT abdomen/pelvis dated 10/29/2015 FINDINGS: CT CHEST FINDINGS Cardiovascular: Heart is normal in size.  No pericardial effusion. No evidence of thoracic aortic aneurysm. Mild atherosclerotic calcifications the aortic arch. Mild coronary atherosclerosis of the LAD. Mediastinum/Nodes: No suspicious mediastinal lymphadenopathy. Visualized thyroid is unremarkable.  Lungs/Pleura: 6.9 x 7.4 cm left upper lobe mass extending into the left suprahilar region (series 2/image 29). 8 mm nodule in the lingula (series 3/image 108). Right lung is clear. Mild centrilobular emphysematous changes. No focal consolidation. No pleural effusion or pneumothorax. Musculoskeletal: Degenerative changes of the thoracic spine. CT ABDOMEN PELVIS FINDINGS Hepatobiliary: Liver is within normal limits. No suspicious/enhancing hepatic lesions. Status post cholecystectomy. No intrahepatic or extrahepatic ductal dilatation. Pancreas: Within normal limits. Spleen: Within normal limits. Adrenals/Urinary Tract: Adrenal glands are within normal limits. 3 mm nonobstructing right lower pole renal calculus (series 2/image 85). Left kidney is within normal limits. No hydronephrosis. Bladder is mildly thick-walled although underdistended. Stomach/Bowel: Stomach is within normal limits. No evidence of bowel obstruction. Normal appendix (series 2/image 111). Status post abdominoperineal resection with left lower quadrant colostomy. Left colonic diverticulosis, without evidence of diverticulitis. Stable postsurgical changes in the presacral space with residual soft tissue measuring 4.5 x 3.6 cm (series 2/image 122), previously 4.1 x 3.5 cm measured in a similar fashion, possibly postsurgical. Residual tumor is not excluded. Vascular/Lymphatic: No evidence of abdominal aortic aneurysm. No suspicious abdominopelvic lymphadenopathy. Reproductive: Prostate is grossly unremarkable. Other: No abdominopelvic ascites. Musculoskeletal: Mild degenerative changes of the lumbar spine. IMPRESSION: 6.9 x 7.4 cm left upper lobe mass extending to the left suprahilar region, suspicious for metastasis in this clinical setting. Additional 8 mm lingular nodule, metastasis not excluded. Status post abdominoperineal resection with left lower quadrant colostomy. Presacral soft tissue measures 4.5 x 3.6 cm, stable versus mildly increased,  possibly postsurgical. Residual tumor is not excluded. Consider PET-CT for further evaluation as clinically warranted. Electronically Signed   By: Julian Hy M.D.   On: 06/13/2018 12:21   Ct Abdomen Pelvis W Contrast  Result Date: 06/13/2018 CLINICAL DATA:  Restaging rectal cancer, rising CEA EXAM: CT CHEST, ABDOMEN, AND PELVIS WITH CONTRAST TECHNIQUE: Multidetector CT imaging of the chest, abdomen and pelvis was performed following the standard protocol during bolus administration of intravenous contrast. CONTRAST:  170mL OMNIPAQUE IOHEXOL 300 MG/ML  SOLN COMPARISON:  CT abdomen/pelvis dated 10/29/2015 FINDINGS: CT CHEST FINDINGS Cardiovascular: Heart is normal in size.  No pericardial effusion. No evidence of thoracic aortic aneurysm. Mild atherosclerotic calcifications the aortic arch. Mild coronary atherosclerosis of the LAD. Mediastinum/Nodes: No suspicious mediastinal lymphadenopathy. Visualized thyroid is unremarkable. Lungs/Pleura: 6.9 x 7.4 cm left upper lobe mass extending into the left suprahilar region (series 2/image 29). 8 mm nodule in the lingula (series 3/image 108). Right lung is clear. Mild centrilobular emphysematous changes. No focal consolidation. No pleural effusion or pneumothorax. Musculoskeletal: Degenerative changes of the thoracic spine. CT ABDOMEN PELVIS  FINDINGS Hepatobiliary: Liver is within normal limits. No suspicious/enhancing hepatic lesions. Status post cholecystectomy. No intrahepatic or extrahepatic ductal dilatation. Pancreas: Within normal limits. Spleen: Within normal limits. Adrenals/Urinary Tract: Adrenal glands are within normal limits. 3 mm nonobstructing right lower pole renal calculus (series 2/image 85). Left kidney is within normal limits. No hydronephrosis. Bladder is mildly thick-walled although underdistended. Stomach/Bowel: Stomach is within normal limits. No evidence of bowel obstruction. Normal appendix (series 2/image 111). Status post  abdominoperineal resection with left lower quadrant colostomy. Left colonic diverticulosis, without evidence of diverticulitis. Stable postsurgical changes in the presacral space with residual soft tissue measuring 4.5 x 3.6 cm (series 2/image 122), previously 4.1 x 3.5 cm measured in a similar fashion, possibly postsurgical. Residual tumor is not excluded. Vascular/Lymphatic: No evidence of abdominal aortic aneurysm. No suspicious abdominopelvic lymphadenopathy. Reproductive: Prostate is grossly unremarkable. Other: No abdominopelvic ascites. Musculoskeletal: Mild degenerative changes of the lumbar spine. IMPRESSION: 6.9 x 7.4 cm left upper lobe mass extending to the left suprahilar region, suspicious for metastasis in this clinical setting. Additional 8 mm lingular nodule, metastasis not excluded. Status post abdominoperineal resection with left lower quadrant colostomy. Presacral soft tissue measures 4.5 x 3.6 cm, stable versus mildly increased, possibly postsurgical. Residual tumor is not excluded. Consider PET-CT for further evaluation as clinically warranted. Electronically Signed   By: Julian Hy M.D.   On: 06/13/2018 12:21   Nm Pet Image Initial (pi) Skull Base To Thigh  Result Date: 06/24/2018 CLINICAL DATA:  Subsequent treatment strategy for rectal carcinoma. EXAM: NUCLEAR MEDICINE PET SKULL BASE TO THIGH TECHNIQUE: 14.39 mCi F-18 FDG was injected intravenously. Full-ring PET imaging was performed from the skull base to thigh after the radiotracer. CT data was obtained and used for attenuation correction and anatomic localization. Fasting blood glucose: 94 mg/dl COMPARISON:  CT 06/13/2018 FINDINGS: Mediastinal blood pool activity: SUV max 2.27 Liver activity: SUV max NA NECK: No hypermetabolic lymph nodes in the neck. Incidental CT findings: none CHEST: Large rounded mass in the LEFT upper lobe measuring 6.5 by 6.6 cm. The mass has central low-attenuation consistent with necrosis. There is a rim  of hypermetabolic activity along the anterior and the medial border of the lesion with SUV max equal 5.1. No additional hypermetabolic pulmonary nodules. Nodular thickening in the inferior lingula without radiotracer activity measuring 11 mm image 121/3. Smaller parenchymal thickening in the lingula more medially (image 122/3 Incidental CT findings: none ABDOMEN/PELVIS: No abnormal metabolic activity liver. No hypermetabolic abdominopelvic lymph nodes. Presacral soft tissue thickening is similar to comparison exams and has low metabolic activity (SUV max equal 2.4). Thickening extends to the LEFT internal iliac vessels but unchanged. Incidental CT findings: LEFT lower quadrant colostomy SKELETON: No focal hypermetabolic activity to suggest skeletal metastasis. Incidental CT findings: none IMPRESSION: 1. Necrotic mass in the LEFT upper lobe with hypermetabolic rim is consistent with metastatic carcinoma. 2. No additional evidence of thoracic metastasis. No evidence of mediastinal nodal metastasis. 3. No evidence of metastatic adenopathy in the abdomen pelvis. 4. Stable soft tissue thickening with mild metabolic activity anterior to the sacrum is favored benign postsurgical inflammation Electronically Signed   By: Suzy Bouchard M.D.   On: 06/24/2018 14:08    ASSESSMENT: Recurrent, stage IV rectal adenocarcinoma.  PLAN:    1.  Recurrent, stage IV rectal adenocarcinoma: Patient was initially considered a clinical stage IIIa, but after completing neoadjuvant 5-FU and XRT followed by surgical excision on October 08, 2013 he was noted to be pathologic stage Ia.  Patient CEA started trending up and CT of the chest revealed a large 7.7 cm left upper lobe mass biopsy consistent with rectal adenocarcinoma.  PET scan completed on June 24, 2018 did not reveal any other evidence of disease.  Case was discussed with thoracic surgery and given the size and location of his mass, surgical resection is not possible.   Patient will return to clinic next week for further evaluation and treatment planning.  He will also have consultation with radiation oncology on that day.   2. Colostomy: Continue stoma care as indicated. 3.  Leukopenia: Chronic and unchanged.  I provided 25 minutes of face-to-face video visit time during this encounter, and > 50% was spent counseling as documented under my assessment & plan.   Patient expressed understanding and was in agreement with this plan. He also understands that He can call clinic at any time with any questions, concerns, or complaints.    [name] Huger, MD   07/06/2018 9:16 AM

## 2018-07-09 ENCOUNTER — Encounter: Payer: Self-pay | Admitting: Oncology

## 2018-07-11 ENCOUNTER — Encounter: Payer: Self-pay | Admitting: *Deleted

## 2018-07-11 ENCOUNTER — Other Ambulatory Visit: Payer: 59

## 2018-07-11 NOTE — Progress Notes (Signed)
Tumor Board Documentation  [name] [name] was presented by Dr Grayland Ormond at our Tumor Board on 07/11/2018, which included representatives from medical oncology, radiation oncology, surgical oncology, surgical, radiology, pathology, navigation, internal medicine, research.  [name] [name] currently presents as a current patient, for discussion, for new positive pathology, for [employer] with history of the following treatments: active survellience, neoadjuvant chemoradiation.  Additionally, we reviewed previous medical and familial history, history of present illness, and recent lab results along with all available histopathologic and imaging studies. The tumor board considered available treatment options and made the following recommendations: Neoadjuvant chemotherapy Offer patient FOLFOX therapy then Radiation therapy (SBRT times 10 treatments over 2 weeks), Treat the Fungus infection  The following procedures/referrals were also placed: No orders of the defined types were placed in this encounter.   Clinical Trial Status: not discussed   Staging used:    AJCC Staging:       Group: Stage 1A Rectal Cancer wth new Lung Met   National site-specific guidelines   were discussed with respect to the case.  Tumor board is a meeting of clinicians from various specialty areas who evaluate and discuss patients for whom a multidisciplinary approach is being considered. Final determinations in the plan of care are those of the provider(s). The responsibility for follow up of recommendations given during tumor board is that of the provider.   Today's extended care, comprehensive team conference, [name] [name] was not present for the discussion and was not examined.   Multidisciplinary Tumor Board is a multidisciplinary case peer review process.  Decisions discussed in the Multidisciplinary Tumor Board reflect the opinions of the specialists present at the conference without having examined the patient.  Ultimately, treatment  and diagnostic decisions rest with the primary provider(s) and the patient.

## 2018-07-13 NOTE — Progress Notes (Deleted)
[name] [name]  [phone]) [phone] Fax:([phone]) [phone]  ID: [name] [name] OB: Jun 22, 1955  MR#: [id number]  GYI#:[id number]  Patient Care Team: Hubbard Hartshorn, FNP as PCP - General (Family Medicine) Marlyce Huge, MD as Attending Physician (Surgery) Lloyd Huger, MD as Consulting Physician (Oncology) Abbie Sons, MD (Urology) Eulogio Bear, MD as Consulting Physician (Ophthalmology) Flora Lipps, MD as Consulting Physician (Pulmonary Disease)  I connected with [name] [name] on 07/13/18 at 10:30 AM EDT by {Blank single:19197::"video enabled telemedicine visit","telephone visit"} and verified that I am speaking with the correct person using two identifiers.   I discussed the limitations, risks, security and privacy concerns of performing an evaluation and management service by telemedicine and the availability of in-person appointments. I also discussed with the patient that there may be a patient responsible charge related to this service. The patient expressed understanding and agreed to proceed.   Other persons participating in the visit and their role in the encounter: ***   Patients location: ***  Providers location: ***   CHIEF COMPLAINT: Recurrent, stage IV rectal adenocarcinoma.  INTERVAL HISTORY: Patient agreed to video enabled telemedicine visit to discuss his pathology results and treatment planning.  He continues to feel well and remains asymptomatic.  He has no issues with his colostomy.  He has no neurologic complaints.  He denies any recent fevers or illnesses.  He denies any chest pain, shortness of breath, cough, or hemoptysis.  He denies nausea, vomiting, constipation, or diarrhea. He has noted no blood or melena in his colostomy bag.  He has no urinary complaints.  Patient offers no specific complaints today.  REVIEW OF SYSTEMS:   Review of Systems  Constitutional: Negative.  Negative for fever, malaise/fatigue and weight  loss.  Respiratory: Negative.  Negative for cough and shortness of breath.   Cardiovascular: Negative.  Negative for chest pain and leg swelling.  Gastrointestinal: Negative.  Negative for abdominal pain, blood in stool, constipation, diarrhea and melena.  Genitourinary: Negative.  Negative for dysuria.  Musculoskeletal: Negative.  Negative for back pain.  Skin: Negative.  Negative for rash.  Neurological: Negative.  Negative for focal weakness, weakness and headaches.  Psychiatric/Behavioral: Negative.  The patient is not nervous/anxious.     As per HPI. Otherwise, a complete review of systems is negative.  PAST MEDICAL HISTORY: Past Medical History:  Diagnosis Date   Anemia    Cancer ([place])    rectal 2015, followed by Oncology, permanent colostomy   Colostomy in place [facility])    History of kidney stones    Seasonal allergies     PAST SURGICAL HISTORY: Past Surgical History:  Procedure Laterality Date   BACK SURGERY     CHOLECYSTECTOMY     COLON SURGERY     ENDOBRONCHIAL ULTRASOUND N/A 07/01/2018   Procedure: ENDOBRONCHIAL ULTRASOUND;  Surgeon: Tyler Pita, MD;  Location: [facility] ORS;  Service: Cardiopulmonary;  Laterality: N/A;   KNEE SURGERY Bilateral    PORT-A-CATH REMOVAL Right 12/02/2014   Procedure: REMOVAL PORT-A-CATH;  Surgeon: Marlyce Huge, MD;  Location: [facility] ORS;  Service: General;  Laterality: Right;   PORTACATH PLACEMENT      FAMILY HISTORY: Father with prostate cancer.  COPD, CHF.     ADVANCED DIRECTIVES:    HEALTH MAINTENANCE: Social History   Tobacco Use   Smoking status: Never Smoker   Smokeless tobacco: Current User    Types: Snuff   Tobacco comment: might smoke a cigar once every 3 months  Substance  Use Topics   Alcohol use: Yes    Alcohol/week: 0.0 standard drinks    Comment: RARE   Drug use: No     Colonoscopy:  PAP:  Bone density:  Lipid panel:  Allergies  Allergen Reactions   Sulfa Antibiotics Hives     Other reaction(s): Other (qualifier value)    Current Outpatient Medications  Medication Sig Dispense Refill   albuterol (VENTOLIN HFA) 108 (90 Base) MCG/ACT inhaler Inhale 2 puffs into the lungs every 6 (six) hours as needed for wheezing or shortness of breath (cough). 1 Inhaler 2   cetirizine (ZYRTEC) 10 MG tablet Take 10 mg by mouth at bedtime.      chlorpheniramine-HYDROcodone (TUSSIONEX PENNKINETIC ER) 10-8 MG/5ML SUER Take 5 mLs by mouth every 12 (twelve) hours as needed for cough. 200 mL 0   gabapentin (NEURONTIN) 100 MG capsule Take 1 capsule once daily before bed x2 days, then take 2 capsules once daily before bed x2 days, then may increase to 3 capsules once daily before bed if needed. (Patient taking differently: Take 200 mg by mouth at bedtime. take 2 capsules once daily before bed x2 days, then may increase to 3 capsules once daily before bed if needed.) 90 capsule 1   guaiFENesin (MUCINEX) 600 MG 12 hr tablet Take 1 tablet (600 mg total) by mouth 2 (two) times daily. (Patient taking differently: Take 600 mg by mouth 2 (two) times daily as needed for cough or to loosen phlegm. ) 20 tablet 0   loperamide (IMODIUM A-D) 2 MG capsule Take 2 mg by mouth as needed for diarrhea or loose stools.      meloxicam (MOBIC) 15 MG tablet Take 15 mg by mouth daily as needed for pain.      Multiple Vitamin (MULTIVITAMIN) tablet Take 1 tablet by mouth daily.     NEEDLE, DISP, 18 G 18G X 1" MISC Use as directed 50 each 0   sodium chloride (OCEAN) 0.65 % SOLN nasal spray Place 1 spray into both nostrils as needed for congestion.     SYRINGE-NEEDLE, DISP, 3 ML (LUER LOCK SAFETY SYRINGES) 21G X 1-1/2" 3 ML MISC Use as directed for testosterone administration 50 each 0   tadalafil (ADCIRCA/CIALIS) 20 MG tablet Take 1 tablet (20 mg total) by mouth daily as needed for erectile dysfunction. 6 tablet 10   testosterone cypionate (DEPOTESTOSTERONE CYPIONATE) 200 MG/ML injection Inject 1 mL (200 mg  total) into the muscle every 14 (fourteen) days. 10 mL 0   timolol (BETIMOL) 0.5 % ophthalmic solution Place 1 drop into the right eye at bedtime.      No current facility-administered medications for this visit.     OBJECTIVE: There were no vitals filed for this visit.   There is no height or weight on file to calculate BMI.    ECOG FS:0 - Asymptomatic  General: Well-developed, well-nourished, no acute distress. HEENT: Normocephalic. Neuro: Alert, answering all questions appropriately. Cranial nerves grossly intact. Skin: No rashes or petechiae noted. Psych: Normal affect.  LAB RESULTS:  Lab Results  Component Value Date   NA 141 03/22/2018   K 4.6 03/22/2018   CL 106 03/22/2018   CO2 26 03/22/2018   GLUCOSE 80 03/22/2018   BUN 13 03/22/2018   CREATININE 1.20 06/13/2018   CALCIUM 9.7 03/22/2018   PROT 6.6 03/22/2018   ALBUMIN 3.9 07/03/2016   AST 17 03/22/2018   ALT 12 03/22/2018   ALKPHOS 58 07/03/2016   BILITOT 0.3 03/22/2018  GFRNONAA 66 03/22/2018   GFRAA 77 03/22/2018    Lab Results  Component Value Date   WBC 3.6 (L) 06/04/2018   NEUTROABS 2.1 06/04/2018   HGB 15.3 06/04/2018   HCT 47.7 06/04/2018   MCV 85.3 06/04/2018   PLT 311 06/04/2018     STUDIES: Ct Chest W Contrast  Result Date: 06/13/2018 CLINICAL DATA:  Restaging rectal cancer, rising CEA EXAM: CT CHEST, ABDOMEN, AND PELVIS WITH CONTRAST TECHNIQUE: Multidetector CT imaging of the chest, abdomen and pelvis was performed following the standard protocol during bolus administration of intravenous contrast. CONTRAST:  174mL OMNIPAQUE IOHEXOL 300 MG/ML  SOLN COMPARISON:  CT abdomen/pelvis dated 10/29/2015 FINDINGS: CT CHEST FINDINGS Cardiovascular: Heart is normal in size.  No pericardial effusion. No evidence of thoracic aortic aneurysm. Mild atherosclerotic calcifications the aortic arch. Mild coronary atherosclerosis of the LAD. Mediastinum/Nodes: No suspicious mediastinal lymphadenopathy. Visualized  thyroid is unremarkable. Lungs/Pleura: 6.9 x 7.4 cm left upper lobe mass extending into the left suprahilar region (series 2/image 29). 8 mm nodule in the lingula (series 3/image 108). Right lung is clear. Mild centrilobular emphysematous changes. No focal consolidation. No pleural effusion or pneumothorax. Musculoskeletal: Degenerative changes of the thoracic spine. CT ABDOMEN PELVIS FINDINGS Hepatobiliary: Liver is within normal limits. No suspicious/enhancing hepatic lesions. Status post cholecystectomy. No intrahepatic or extrahepatic ductal dilatation. Pancreas: Within normal limits. Spleen: Within normal limits. Adrenals/Urinary Tract: Adrenal glands are within normal limits. 3 mm nonobstructing right lower pole renal calculus (series 2/image 85). Left kidney is within normal limits. No hydronephrosis. Bladder is mildly thick-walled although underdistended. Stomach/Bowel: Stomach is within normal limits. No evidence of bowel obstruction. Normal appendix (series 2/image 111). Status post abdominoperineal resection with left lower quadrant colostomy. Left colonic diverticulosis, without evidence of diverticulitis. Stable postsurgical changes in the presacral space with residual soft tissue measuring 4.5 x 3.6 cm (series 2/image 122), previously 4.1 x 3.5 cm measured in a similar fashion, possibly postsurgical. Residual tumor is not excluded. Vascular/Lymphatic: No evidence of abdominal aortic aneurysm. No suspicious abdominopelvic lymphadenopathy. Reproductive: Prostate is grossly unremarkable. Other: No abdominopelvic ascites. Musculoskeletal: Mild degenerative changes of the lumbar spine. IMPRESSION: 6.9 x 7.4 cm left upper lobe mass extending to the left suprahilar region, suspicious for metastasis in this clinical setting. Additional 8 mm lingular nodule, metastasis not excluded. Status post abdominoperineal resection with left lower quadrant colostomy. Presacral soft tissue measures 4.5 x 3.6 cm, stable  versus mildly increased, possibly postsurgical. Residual tumor is not excluded. Consider PET-CT for further evaluation as clinically warranted. Electronically Signed   By: Julian Hy M.D.   On: 06/13/2018 12:21   Ct Abdomen Pelvis W Contrast  Result Date: 06/13/2018 CLINICAL DATA:  Restaging rectal cancer, rising CEA EXAM: CT CHEST, ABDOMEN, AND PELVIS WITH CONTRAST TECHNIQUE: Multidetector CT imaging of the chest, abdomen and pelvis was performed following the standard protocol during bolus administration of intravenous contrast. CONTRAST:  117mL OMNIPAQUE IOHEXOL 300 MG/ML  SOLN COMPARISON:  CT abdomen/pelvis dated 10/29/2015 FINDINGS: CT CHEST FINDINGS Cardiovascular: Heart is normal in size.  No pericardial effusion. No evidence of thoracic aortic aneurysm. Mild atherosclerotic calcifications the aortic arch. Mild coronary atherosclerosis of the LAD. Mediastinum/Nodes: No suspicious mediastinal lymphadenopathy. Visualized thyroid is unremarkable. Lungs/Pleura: 6.9 x 7.4 cm left upper lobe mass extending into the left suprahilar region (series 2/image 29). 8 mm nodule in the lingula (series 3/image 108). Right lung is clear. Mild centrilobular emphysematous changes. No focal consolidation. No pleural effusion or pneumothorax. Musculoskeletal: Degenerative changes of the  thoracic spine. CT ABDOMEN PELVIS FINDINGS Hepatobiliary: Liver is within normal limits. No suspicious/enhancing hepatic lesions. Status post cholecystectomy. No intrahepatic or extrahepatic ductal dilatation. Pancreas: Within normal limits. Spleen: Within normal limits. Adrenals/Urinary Tract: Adrenal glands are within normal limits. 3 mm nonobstructing right lower pole renal calculus (series 2/image 85). Left kidney is within normal limits. No hydronephrosis. Bladder is mildly thick-walled although underdistended. Stomach/Bowel: Stomach is within normal limits. No evidence of bowel obstruction. Normal appendix (series 2/image 111).  Status post abdominoperineal resection with left lower quadrant colostomy. Left colonic diverticulosis, without evidence of diverticulitis. Stable postsurgical changes in the presacral space with residual soft tissue measuring 4.5 x 3.6 cm (series 2/image 122), previously 4.1 x 3.5 cm measured in a similar fashion, possibly postsurgical. Residual tumor is not excluded. Vascular/Lymphatic: No evidence of abdominal aortic aneurysm. No suspicious abdominopelvic lymphadenopathy. Reproductive: Prostate is grossly unremarkable. Other: No abdominopelvic ascites. Musculoskeletal: Mild degenerative changes of the lumbar spine. IMPRESSION: 6.9 x 7.4 cm left upper lobe mass extending to the left suprahilar region, suspicious for metastasis in this clinical setting. Additional 8 mm lingular nodule, metastasis not excluded. Status post abdominoperineal resection with left lower quadrant colostomy. Presacral soft tissue measures 4.5 x 3.6 cm, stable versus mildly increased, possibly postsurgical. Residual tumor is not excluded. Consider PET-CT for further evaluation as clinically warranted. Electronically Signed   By: Julian Hy M.D.   On: 06/13/2018 12:21   Nm Pet Image Initial (pi) Skull Base To Thigh  Result Date: 06/24/2018 CLINICAL DATA:  Subsequent treatment strategy for rectal carcinoma. EXAM: NUCLEAR MEDICINE PET SKULL BASE TO THIGH TECHNIQUE: 14.39 mCi F-18 FDG was injected intravenously. Full-ring PET imaging was performed from the skull base to thigh after the radiotracer. CT data was obtained and used for attenuation correction and anatomic localization. Fasting blood glucose: 94 mg/dl COMPARISON:  CT 06/13/2018 FINDINGS: Mediastinal blood pool activity: SUV max 2.27 Liver activity: SUV max NA NECK: No hypermetabolic lymph nodes in the neck. Incidental CT findings: none CHEST: Large rounded mass in the LEFT upper lobe measuring 6.5 by 6.6 cm. The mass has central low-attenuation consistent with necrosis.  There is a rim of hypermetabolic activity along the anterior and the medial border of the lesion with SUV max equal 5.1. No additional hypermetabolic pulmonary nodules. Nodular thickening in the inferior lingula without radiotracer activity measuring 11 mm image 121/3. Smaller parenchymal thickening in the lingula more medially (image 122/3 Incidental CT findings: none ABDOMEN/PELVIS: No abnormal metabolic activity liver. No hypermetabolic abdominopelvic lymph nodes. Presacral soft tissue thickening is similar to comparison exams and has low metabolic activity (SUV max equal 2.4). Thickening extends to the LEFT internal iliac vessels but unchanged. Incidental CT findings: LEFT lower quadrant colostomy SKELETON: No focal hypermetabolic activity to suggest skeletal metastasis. Incidental CT findings: none IMPRESSION: 1. Necrotic mass in the LEFT upper lobe with hypermetabolic rim is consistent with metastatic carcinoma. 2. No additional evidence of thoracic metastasis. No evidence of mediastinal nodal metastasis. 3. No evidence of metastatic adenopathy in the abdomen pelvis. 4. Stable soft tissue thickening with mild metabolic activity anterior to the sacrum is favored benign postsurgical inflammation Electronically Signed   By: Suzy Bouchard M.D.   On: 06/24/2018 14:08    ASSESSMENT: Recurrent, stage IV rectal adenocarcinoma.  PLAN:    1.  Recurrent, stage IV rectal adenocarcinoma: Patient was initially considered a clinical stage IIIa, but after completing neoadjuvant 5-FU and XRT followed by surgical excision on October 08, 2013 he was noted to  be pathologic stage Ia.  Patient CEA started trending up and CT of the chest revealed a large 7.7 cm left upper lobe mass biopsy consistent with rectal adenocarcinoma.  PET scan completed on June 24, 2018 did not reveal any other evidence of disease.  Case was discussed with thoracic surgery and given the size and location of his mass, surgical resection is not  possible.  Patient will return to clinic next week for further evaluation and treatment planning.  He will also have consultation with radiation oncology on that day.   2. Colostomy: Continue stoma care as indicated. 3.  Leukopenia: Chronic and unchanged.  I provided *** minutes of {Blank single:19197::"face-to-face video visit time","non face-to-face telephone visit time"} during this encounter, and > 50% was spent counseling as documented under my assessment & plan.   Patient expressed understanding and was in agreement with this plan. He also understands that He can call clinic at any time with any questions, concerns, or complaints.    Lloyd Huger, MD   07/13/2018 7:14 AM

## 2018-07-15 ENCOUNTER — Other Ambulatory Visit: Payer: Self-pay

## 2018-07-16 ENCOUNTER — Encounter: Payer: Self-pay | Admitting: Oncology

## 2018-07-16 ENCOUNTER — Inpatient Hospital Stay: Payer: 59 | Admitting: Oncology

## 2018-07-16 ENCOUNTER — Inpatient Hospital Stay: Payer: 59 | Attending: Oncology | Admitting: Oncology

## 2018-07-16 DIAGNOSIS — C20 Malignant neoplasm of rectum: Secondary | ICD-10-CM | POA: Diagnosis not present

## 2018-07-17 MED ORDER — LIDOCAINE-PRILOCAINE 2.5-2.5 % EX CREA: TOPICAL_CREAM | CUTANEOUS | 3 refills | Status: DC

## 2018-07-17 MED ORDER — PROCHLORPERAZINE MALEATE 10 MG PO TABS: 10.0000 mg | ORAL_TABLET | Freq: Four times a day (QID) | ORAL | 2 refills | Status: DC | PRN

## 2018-07-17 MED ORDER — ONDANSETRON HCL 8 MG PO TABS: 8.0000 mg | ORAL_TABLET | Freq: Two times a day (BID) | ORAL | 2 refills | Status: DC | PRN

## 2018-07-17 NOTE — Progress Notes (Signed)
[place]  [phone] [phone]) [phone]  ID: [name] [name] OB: 09-Oct-1955  MR#: [id number]  VOH#:[id number]  Patient Care Team: Hubbard Hartshorn, FNP as PCP - General (Family Medicine) Marlyce Huge, MD as Attending Physician (Surgery) Lloyd Huger, MD as Consulting Physician (Oncology) Abbie Sons, MD (Urology) Eulogio Bear, MD as Consulting Physician (Ophthalmology) Flora Lipps, MD as Consulting Physician (Pulmonary Disease)  I connected with [name] [name] on 07/17/18 at  3:00 PM EDT by telephone visit and verified that I am speaking with the correct person using two identifiers.   I discussed the limitations, risks, security and privacy concerns of performing an evaluation and management service by telemedicine and the availability of in-person appointments. I also discussed with the patient that there may be a patient responsible charge related to this service. The patient expressed understanding and agreed to proceed.   Other persons participating in the visit and their role in the encounter: Patient, MD  Patient's location: Home Provider's location: Clinic  CHIEF COMPLAINT: Recurrent, stage IV rectal adenocarcinoma.  INTERVAL HISTORY: Patient agreed to discussion and evaluation via telephone for treatment planning. He continues to feel well and remains asymptomatic.  He has no issues with his colostomy.  He has no neurologic complaints.  He denies any recent fevers or illnesses.  He denies any chest pain, shortness of breath, cough, or hemoptysis.  He denies nausea, vomiting, constipation, or diarrhea. He has noted no blood or melena in his colostomy bag.  He has no urinary complaints.  Patient feels at his baseline offers no specific complaints today.  REVIEW OF SYSTEMS:   Review of Systems  Constitutional: Negative.  Negative for fever, malaise/fatigue and weight loss.  Respiratory: Negative.  Negative for cough and  shortness of breath.   Cardiovascular: Negative.  Negative for chest pain and leg swelling.  Gastrointestinal: Negative.  Negative for abdominal pain, blood in stool, constipation, diarrhea and melena.  Genitourinary: Negative.  Negative for dysuria.  Musculoskeletal: Negative.  Negative for back pain.  Skin: Negative.  Negative for rash.  Neurological: Negative.  Negative for focal weakness, weakness and headaches.  Psychiatric/Behavioral: Negative.  The patient is not nervous/anxious.     As per HPI. Otherwise, a complete review of systems is negative.  PAST MEDICAL HISTORY: Past Medical History:  Diagnosis Date  . Anemia   . Cancer [facility])    rectal 2015, followed by Oncology, permanent colostomy  . Colostomy in place [facility])   . History of kidney stones   . Seasonal allergies     PAST SURGICAL HISTORY: Past Surgical History:  Procedure Laterality Date  . BACK SURGERY    . CHOLECYSTECTOMY    . COLON SURGERY    . ENDOBRONCHIAL ULTRASOUND N/A 07/01/2018   Procedure: ENDOBRONCHIAL ULTRASOUND;  Surgeon: Tyler Pita, MD;  Location: [facility] ORS;  Service: Cardiopulmonary;  Laterality: N/A;  . KNEE SURGERY Bilateral   . PORT-A-CATH REMOVAL Right 12/02/2014   Procedure: REMOVAL PORT-A-CATH;  Surgeon: Marlyce Huge, MD;  Location: [facility] ORS;  Service: General;  Laterality: Right;  . PORTACATH PLACEMENT      FAMILY HISTORY: Father with prostate cancer.  COPD, CHF.     ADVANCED DIRECTIVES:    HEALTH MAINTENANCE: Social History   Tobacco Use  . Smoking status: Never Smoker  . Smokeless tobacco: Current User    Types: Snuff  . Tobacco comment: might smoke a cigar once every 3 months  Substance Use Topics  . Alcohol use:  Yes    Alcohol/week: 0.0 standard drinks    Comment: RARE  . Drug use: No     Colonoscopy:  PAP:  Bone density:  Lipid panel:  Allergies  Allergen Reactions  . Sulfa Antibiotics Hives    Other reaction(s): Other (qualifier value)     Current Outpatient Medications  Medication Sig Dispense Refill  . albuterol (VENTOLIN HFA) 108 (90 Base) MCG/ACT inhaler Inhale 2 puffs into the lungs every 6 (six) hours as needed for wheezing or shortness of breath (cough). 1 Inhaler 2  . cetirizine (ZYRTEC) 10 MG tablet Take 10 mg by mouth at bedtime.     . chlorpheniramine-HYDROcodone (TUSSIONEX PENNKINETIC ER) 10-8 MG/5ML SUER Take 5 mLs by mouth every 12 (twelve) hours as needed for cough. 200 mL 0  . gabapentin (NEURONTIN) 100 MG capsule Take 1 capsule once daily before bed x2 days, then take 2 capsules once daily before bed x2 days, then may increase to 3 capsules once daily before bed if needed. (Patient taking differently: Take 200 mg by mouth at bedtime. take 2 capsules once daily before bed x2 days, then may increase to 3 capsules once daily before bed if needed.) 90 capsule 1  . guaiFENesin (MUCINEX) 600 MG 12 hr tablet Take 1 tablet (600 mg total) by mouth 2 (two) times daily. (Patient taking differently: Take 600 mg by mouth 2 (two) times daily as needed for cough or to loosen phlegm. ) 20 tablet 0  . loperamide (IMODIUM A-D) 2 MG capsule Take 2 mg by mouth as needed for diarrhea or loose stools.     . meloxicam (MOBIC) 15 MG tablet Take 15 mg by mouth daily as needed for pain.     . Multiple Vitamin (MULTIVITAMIN) tablet Take 1 tablet by mouth daily.    [name] NEEDLE, DISP, 18 G 18G X 1" MISC Use as directed 50 each 0  . sodium chloride (OCEAN) 0.65 % SOLN nasal spray Place 1 spray into both nostrils as needed for congestion.    . SYRINGE-NEEDLE, DISP, 3 ML (LUER LOCK SAFETY SYRINGES) 21G X 1-1/2" 3 ML MISC Use as directed for testosterone administration 50 each 0  . tadalafil (ADCIRCA/CIALIS) 20 MG tablet Take 1 tablet (20 mg total) by mouth daily as needed for erectile dysfunction. 6 tablet 10  . testosterone cypionate (DEPOTESTOSTERONE CYPIONATE) 200 MG/ML injection Inject 1 mL (200 mg total) into the muscle every 14 (fourteen) days. 10  mL 0  . timolol (BETIMOL) 0.5 % ophthalmic solution Place 1 drop into the right eye at bedtime.      No current facility-administered medications for this visit.     OBJECTIVE: There were no vitals filed for this visit.   There is no height or weight on file to calculate BMI.    ECOG FS:0 - Asymptomatic   LAB RESULTS:  Lab Results  Component Value Date   NA 141 03/22/2018   K 4.6 03/22/2018   CL 106 03/22/2018   CO2 26 03/22/2018   GLUCOSE 80 03/22/2018   BUN 13 03/22/2018   CREATININE 1.20 06/13/2018   CALCIUM 9.7 03/22/2018   PROT 6.6 03/22/2018   ALBUMIN 3.9 07/03/2016   AST 17 03/22/2018   ALT 12 03/22/2018   ALKPHOS 58 07/03/2016   BILITOT 0.3 03/22/2018   GFRNONAA 66 03/22/2018   GFRAA 77 03/22/2018    Lab Results  Component Value Date   WBC 3.6 (L) 06/04/2018   NEUTROABS 2.1 06/04/2018   HGB 15.3 06/04/2018  HCT 47.7 06/04/2018   MCV 85.3 06/04/2018   PLT 311 06/04/2018     STUDIES: Nm Pet Image Initial (pi) Skull Base To Thigh  Result Date: 06/24/2018 CLINICAL DATA:  Subsequent treatment strategy for rectal carcinoma. EXAM: NUCLEAR MEDICINE PET SKULL BASE TO THIGH TECHNIQUE: 14.39 mCi F-18 FDG was injected intravenously. Full-ring PET imaging was performed from the skull base to thigh after the radiotracer. CT data was obtained and used for attenuation correction and anatomic localization. Fasting blood glucose: 94 mg/dl COMPARISON:  CT 06/13/2018 FINDINGS: Mediastinal blood pool activity: SUV max 2.27 Liver activity: SUV max NA NECK: No hypermetabolic lymph nodes in the neck. Incidental CT findings: none CHEST: Large rounded mass in the LEFT upper lobe measuring 6.5 by 6.6 cm. The mass has central low-attenuation consistent with necrosis. There is a rim of hypermetabolic activity along the anterior and the medial border of the lesion with SUV max equal 5.1. No additional hypermetabolic pulmonary nodules. Nodular thickening in the inferior lingula without  radiotracer activity measuring 11 mm image 121/3. Smaller parenchymal thickening in the lingula more medially (image 122/3 Incidental CT findings: none ABDOMEN/PELVIS: No abnormal metabolic activity liver. No hypermetabolic abdominopelvic lymph nodes. Presacral soft tissue thickening is similar to comparison exams and has low metabolic activity (SUV max equal 2.4). Thickening extends to the LEFT internal iliac vessels but unchanged. Incidental CT findings: LEFT lower quadrant colostomy SKELETON: No focal hypermetabolic activity to suggest skeletal metastasis. Incidental CT findings: none IMPRESSION: 1. Necrotic mass in the LEFT upper lobe with hypermetabolic rim is consistent with metastatic carcinoma. 2. No additional evidence of thoracic metastasis. No evidence of mediastinal nodal metastasis. 3. No evidence of metastatic adenopathy in the abdomen pelvis. 4. Stable soft tissue thickening with mild metabolic activity anterior to the sacrum is favored benign postsurgical inflammation Electronically Signed   By: Suzy Bouchard M.D.   On: 06/24/2018 14:08    ASSESSMENT: Recurrent, stage IV rectal adenocarcinoma.  PLAN:    1.  Recurrent, stage IV rectal adenocarcinoma: Patient was initially considered a clinical stage IIIa, but after completing neoadjuvant 5-FU and XRT followed by surgical excision on October 08, 2013 he was noted to be pathologic stage Ia.  Patient CEA started trending up and CT of the chest revealed a large 7.7 cm left upper lobe mass biopsy consistent with rectal adenocarcinoma.  PET scan completed on June 24, 2018 did not reveal any other evidence of disease.  Case was discussed with thoracic surgery and given the size and location of his mass, surgical resection is not possible.  Treatment with XRT was also discussed, but it was agreed upon that patient should pursue FOLFOX plus Avastin as initial treatment and consider XRT later if there is any residual disease.  Patient will require  port placement prior to treatment.  Return to clinic in approximately 1 week to initiate cycle 1 of FOLFOX plus Avastin.  3.  Leukopenia: Chronic and unchanged.  I provided 25 minutes of non face-to-face telephone visit time during this encounter, and > 50% was spent counseling as documented under my assessment & plan.   Patient expressed understanding and was in agreement with this plan. He also understands that He can call clinic at any time with any questions, concerns, or complaints.    Lloyd Huger, MD   07/17/2018 11:57 AM

## 2018-07-17 NOTE — Progress Notes (Signed)
START ON PATHWAY REGIMEN - Colorectal     A cycle is every 14 days:     Bevacizumab-xxxx      Oxaliplatin      Leucovorin      Fluorouracil      Fluorouracil   **Always confirm dose/schedule in your pharmacy ordering system**  Patient Characteristics: Distant Metastases, Nonsurgical Candidate, KRAS/NRAS Mutation Positive/Unknown (BRAF V600 Wild-Type/Unknown), Standard Cytotoxic Therapy, First Line Standard Cytotoxic Therapy, Bevacizumab Eligible, PS = 0,1 Tumor Location: Rectal Therapeutic Status: Distant Metastases Microsatellite/Mismatch Repair Status: Unknown BRAF Mutation Status: Quantity Not Sufficient KRAS/NRAS Mutation Status: Quantity Not Sufficient Standard Cytotoxic Line of Therapy: First Line Standard Cytotoxic Therapy ECOG Performance Status: 0 Bevacizumab Eligibility: Eligible Intent of Therapy: Curative Intent, Discussed with Patient

## 2018-07-23 ENCOUNTER — Other Ambulatory Visit: Payer: Self-pay

## 2018-07-23 ENCOUNTER — Ambulatory Visit (INDEPENDENT_AMBULATORY_CARE_PROVIDER_SITE_OTHER): Payer: 59 | Admitting: General Surgery

## 2018-07-23 ENCOUNTER — Encounter: Payer: Self-pay | Admitting: Oncology

## 2018-07-23 ENCOUNTER — Encounter: Payer: Self-pay | Admitting: General Surgery

## 2018-07-23 VITALS — BP 160/72 | HR 114 | Temp 97.8°F | Ht 75.0 in | Wt 263.0 lb

## 2018-07-23 DIAGNOSIS — C20 Malignant neoplasm of rectum: Secondary | ICD-10-CM | POA: Diagnosis not present

## 2018-07-23 DIAGNOSIS — R918 Other nonspecific abnormal finding of lung field: Secondary | ICD-10-CM

## 2018-07-23 NOTE — Telephone Encounter (Addendum)
Dr. Patsey Berthold please advise on Tussionex refill.   Rx for Tussionex last prescribed 07/03/2018 #253ml. Last OV 06/26/2018 with no pending OV.

## 2018-07-23 NOTE — Progress Notes (Signed)
Patient ID: [name] [name], male   DOB: 05-23-1955, 63 y.o.   MRN: [id number]  Chief Complaint  Patient presents with  . Other    port placement     HPI [name] [name] is a 63 y.o. male here today for a evaluation of a port placement .  The patient had a rectal mass which was biopsy-proven for low-grade mucinous adenocarcinoma.  He underwent chemoradiation and this was followed by an abdominal perineal resection in September 2015.  He had done well until recently when he was noted with a rising CEA.  CT scan showed a 7.7 cm left upper lobe mass, biopsy-proven of metastatic disease.  He is now a candidate for neoadjuvant chemotherapy prior to possible resection of this solitary metastatic foci.   HPI  Past Medical History:  Diagnosis Date  . Anemia   . Cancer [facility])    rectal 2015, followed by Oncology, permanent colostomy  . Colostomy in place [facility])   . History of kidney stones   . Seasonal allergies     Past Surgical History:  Procedure Laterality Date  . BACK SURGERY    . CHOLECYSTECTOMY    . COLON SURGERY    . ENDOBRONCHIAL ULTRASOUND N/A 07/01/2018   Procedure: ENDOBRONCHIAL ULTRASOUND;  Surgeon: Tyler Pita, MD;  Location: [facility] ORS;  Service: Cardiopulmonary;  Laterality: N/A;  . KNEE SURGERY Bilateral   . PORT-A-CATH REMOVAL Right 12/02/2014   Procedure: REMOVAL PORT-A-CATH;  Surgeon: Marlyce Huge, MD;  Location: [facility] ORS;  Service: General;  Laterality: Right;  . PORTACATH PLACEMENT      Family History  Problem Relation Age of Onset  . Cancer Mother   . Hypertension Mother   . COPD Mother   . COPD Father   . Cancer Father        Prostate CA, on Lupron  . Stroke Maternal Grandmother   . Cancer Paternal Grandmother   . Heart attack Paternal Grandfather   . Cancer Paternal Grandfather     Social History Social History   Tobacco Use  . Smoking status: Never Smoker  . Smokeless tobacco: Current User    Types: Snuff  . Tobacco comment: might smoke a  cigar once every 3 months  Substance Use Topics  . Alcohol use: Yes    Alcohol/week: 0.0 standard drinks    Comment: RARE  . Drug use: No    Allergies  Allergen Reactions  . Sulfa Antibiotics Hives    Other reaction(s): Other (qualifier value)    Current Outpatient Medications  Medication Sig Dispense Refill  . albuterol (VENTOLIN HFA) 108 (90 Base) MCG/ACT inhaler Inhale 2 puffs into the lungs every 6 (six) hours as needed for wheezing or shortness of breath (cough). 1 Inhaler 2  . cetirizine (ZYRTEC) 10 MG tablet Take 10 mg by mouth at bedtime.     . gabapentin (NEURONTIN) 100 MG capsule Take 1 capsule once daily before bed x2 days, then take 2 capsules once daily before bed x2 days, then may increase to 3 capsules once daily before bed if needed. (Patient taking differently: Take 200 mg by mouth at bedtime. take 2 capsules once daily before bed x2 days, then may increase to 3 capsules once daily before bed if needed.) 90 capsule 1  . guaiFENesin (MUCINEX) 600 MG 12 hr tablet Take 1 tablet (600 mg total) by mouth 2 (two) times daily. (Patient taking differently: Take 600 mg by mouth 2 (two) times daily as needed for cough or to  loosen phlegm. ) 20 tablet 0  . lidocaine-prilocaine (EMLA) cream Apply to affected area once 30 g 3  . loperamide (IMODIUM A-D) 2 MG capsule Take 2 mg by mouth as needed for diarrhea or loose stools.     . Multiple Vitamin (MULTIVITAMIN) tablet Take 1 tablet by mouth daily.    [name] NEEDLE, DISP, 18 G 18G X 1" MISC Use as directed 50 each 0  . ondansetron (ZOFRAN) 8 MG tablet Take 1 tablet (8 mg total) by mouth 2 (two) times daily as needed for refractory nausea / vomiting. 30 tablet 2  . prochlorperazine (COMPAZINE) 10 MG tablet Take 1 tablet (10 mg total) by mouth every 6 (six) hours as needed (Nausea or vomiting). 60 tablet 2  . sodium chloride (OCEAN) 0.65 % SOLN nasal spray Place 1 spray into both nostrils as needed for congestion.    . SYRINGE-NEEDLE, DISP, 3  ML (LUER LOCK SAFETY SYRINGES) 21G X 1-1/2" 3 ML MISC Use as directed for testosterone administration 50 each 0  . tadalafil (ADCIRCA/CIALIS) 20 MG tablet Take 1 tablet (20 mg total) by mouth daily as needed for erectile dysfunction. 6 tablet 10  . testosterone cypionate (DEPOTESTOSTERONE CYPIONATE) 200 MG/ML injection Inject 1 mL (200 mg total) into the muscle every 14 (fourteen) days. 10 mL 0  . timolol (BETIMOL) 0.5 % ophthalmic solution Place 1 drop into the right eye at bedtime.     . chlorpheniramine-HYDROcodone (TUSSIONEX PENNKINETIC ER) 10-8 MG/5ML SUER Take 5 mLs by mouth every 12 (twelve) hours as needed for cough. 200 mL 0  . meloxicam (MOBIC) 15 MG tablet Take 15 mg by mouth daily as needed for pain.      No current facility-administered medications for this visit.     Review of Systems Review of Systems  Constitutional: Negative.   Respiratory: Negative.   Cardiovascular: Negative.     Blood pressure (!) 160/72, pulse (!) 114, temperature 97.8 F (36.6 C), temperature source Skin, height 6\' 3"  (1.905 m), weight 263 lb (119.3 kg), SpO2 97 %.  Physical Exam Physical Exam Constitutional:      Appearance: Normal appearance.  Neck:     Musculoskeletal: Normal range of motion and neck supple.  Cardiovascular:     Rate and Rhythm: Normal rate and regular rhythm.  Pulmonary:     Effort: Pulmonary effort is normal.     Breath sounds: Normal breath sounds.    Lymphadenopathy:     Cervical: No cervical adenopathy.     Upper Body:     Left upper body: No supraclavicular adenopathy.  Skin:    General: Skin is warm and dry.  Neurological:     General: No focal deficit present.     Mental Status: He is alert and oriented to person, place, and time.     Data Reviewed The chest CT of Jun 13, 2018 and PET/CT of June 24, 2018 was independently reviewed.  Isolated tumor in the left upper lobe abutting the pericardium.  July 01, 2018 left upper lobe bronchoscopic biopsy: A.  LUNG, LEFT UPPER LOBE; BRONCHOSCOPY WITH BIOPSY:  - SEPTATE AND BRANCHING FUNGAL HYPHAE, CONSISTENT WITH FUNGUS BALL.  - FRAGMENTS OF BENIGN RESPIRATORY MUCOSA.  - NEGATIVE FOR MALIGNANCY.   Cytology left upper lobe brushing: DIAGNOSIS:  A. LUNG, LEFT UPPER LOBE; BRONCHOSCOPY WITH BRUSHING:  - POSITIVE FOR MALIGNANCY.  - ADENOCARCINOMA CONSISTENT WITH METASTATIC COLORECTAL CARCINOMA IS  PRESENT.   Assessment Isolated pulmonary metastasis nearly 5 years status post diagnosis and  treatment.  Plan  The risks associated with central venous access including arterial, pulmonary and venous injury were reviewed. The possible need for additional treatment if pulmonary injury occurs (chest tube placement) was discussed.  In light of the left lung tumor, we will plan to place the port on that side. Patient to be scheduled for a port placement. The patient is aware to call back for any questions or concerns.   HPI, Physical Exam, Assessment and Plan have been scribed under the direction and in the presence of Hervey Ard, MD.  Gaspar Cola, CMA  I have completed the exam and reviewed the above documentation for accuracy and completeness.  I agree with the above.  Haematologist has been used and any errors in dictation or transcription are unintentional.  Hervey Ard, M.D., F.A.C.S.  Forest Gleason  07/24/2018, 8:29 PM  Patient's surgery to be scheduled for 07-29-18 at [facility] with Dr. Bary Castilla.  The patient is aware to have COVID-19 testing done on 07-25-18 at the [facility] drive thru ([address] [place]) between 8:00 am and 10:30 am. He is aware to isolate after, have no visitors, wash hands frequently, and avoid touching face.   The patient is aware he may be contacted by the [place] [facility] to complete a phone interview sometime in the near future. He recently completed a phone interview on 06-27-18. Patient aware to follow previous  instructions if this does not need to be repeated.   Patient aware to be NPO after midnight and have a driver.   He is aware to check in at the [place] entrance where he will be screened for the coronavirus and then sent to [facility] Day Surgery.   Patient aware that he may have no visitors and driver will need to wait in the car due to COVID-19 restrictions.   The patient verbalizes understanding of the above.   The patient is aware to call the office should he have further questions.   Dominga Ferry, CMA

## 2018-07-23 NOTE — Patient Instructions (Signed)
Implanted Port Insertion Implanted port insertion is a procedure to put in a port and catheter. The port is a device with an injectable disk that can be accessed by your health care provider. The port is connected to a vein in the chest or neck by a small flexible tube (catheter). There are different types of ports. The implanted port may be used as a long-term IV access for:  Medicines, such as chemotherapy.  Fluids.  Liquid nutrition, such as total parenteral nutrition (TPN). When you have a port, this means that your health care provider will not need to use the veins in your arms for these procedures. Tell a health care provider about:  Any allergies you have.  All medicines you are taking, especially blood thinners, as well as any vitamins, herbs, eye drops, creams, over-the-counter medicines, and steroids.  Any problems you or family members have had with anesthetic medicines.  Any blood disorders you have.  Any surgeries you have had.  Any medical conditions you have or have had, including diabetes or kidney problems.  Whether you are pregnant or may be pregnant. What are the risks? Generally, this is a safe procedure. However, problems may occur, including:  Allergic reactions to medicines or dyes.  Damage to other structures or organs.  Infection.  Damage to the blood vessel, bruising, or bleeding at the puncture site.  Blood clot.  Breakdown of the skin over the port.  A collection of air in the chest that can cause one of the lungs to collapse (pneumothorax). This is rare. What happens before the procedure? Medicines  Ask your health care provider about: ? Changing or stopping your regular medicines. This is especially important if you are taking diabetes medicines or blood thinners. ? Taking medicines such as aspirin and ibuprofen. These medicines can thin your blood. Do not take these medicines unless your health care provider tells you to take them. ?  Taking over-the-counter medicines, vitamins, herbs, and supplements. Staying hydrated Follow instructions from your health care provider about hydration, which may include:  Up to 2 hours before the procedure - you may continue to drink clear liquids, such as water, clear fruit juice, black coffee, and plain tea.  Eating and drinking restrictions  Follow instructions from your health care provider about eating and drinking, which may include: ? 8 hours before the procedure - stop eating heavy meals or foods, such as meat, fried foods, or fatty foods. ? 6 hours before the procedure - stop eating light meals or foods, such as toast or cereal. ? 6 hours before the procedure - stop drinking milk or drinks that contain milk. ? 2 hours before the procedure - stop drinking clear liquids. General instructions  Plan to have someone take you home from the hospital or clinic.  If you will be going home right after the procedure, plan to have someone with you for 24 hours.  You may have blood tests.  Do not use any products that contain nicotine or tobacco for at least 4-6 weeks before the procedure. These products include cigarettes, e-cigarettes, and chewing tobacco. If you need help quitting, ask your health care provider.  Ask your health care provider what steps will be taken to help prevent infection. These may include: ? Removing hair at the surgery site. ? Washing skin with a germ-killing soap. ? Taking antibiotic medicine. What happens during the procedure?   An IV will be inserted into one of your veins.  You will be given  one or more of the following: ? A medicine to help you relax (sedative). ? A medicine to numb the area (local anesthetic).  Two small incisions will be made to insert the port. ? One smaller incision will be made in your neck to get access to the vein where the catheter will lie. ? The other incision will be made in the upper chest. This is where the port will  lie.  The procedure may be done using continuous X-ray (fluoroscopy) or other imaging tools for guidance.  The port and catheter will be placed. There may be a small, raised area where the port is.  The port will be flushed with a salt solution (saline), and blood will be drawn to make sure that it is working correctly.  The incisions will be closed.  Bandages (dressings) may be placed over the incisions. The procedure may vary among health care providers and hospitals. What happens after the procedure?  Your blood pressure, heart rate, breathing rate, and blood oxygen level will be monitored until you leave the hospital or clinic.  Do not drive for 24 hours if you were given a sedative during your procedure.  You will be given a manufacturer's information card for the type of port that you have. Keep this with you.  Your port will need to be flushed and checked as told by your health care provider, usually every few weeks.  A chest X-ray will be done to: ? Check the placement of the port. ? Make sure there is no injury to your lung. Summary  Implanted port insertion is a procedure to put in a port and catheter.  The implanted port is used as a long-term IV access.  The port will need to be flushed and checked as told by your health care provider, usually every few weeks.  Keep your manufacturer's information card with you at all times. This information is not intended to replace advice given to you by your health care provider. Make sure you discuss any questions you have with your health care provider. Document Released: 10/30/2012 Document Revised: 05/03/2018 Document Reviewed: 08/07/2017 Elsevier Patient Education  [place].

## 2018-07-24 MED ORDER — HYDROCOD POLST-CPM POLST ER 10-8 MG/5ML PO SUER: 5.0000 mL | Freq: Two times a day (BID) | ORAL | 0 refills | Status: DC | PRN

## 2018-07-25 ENCOUNTER — Inpatient Hospital Stay: Payer: 59 | Attending: Oncology

## 2018-07-25 ENCOUNTER — Other Ambulatory Visit
Admission: RE | Admit: 2018-07-25 | Discharge: 2018-07-25 | Disposition: A | Discharge status: Home / Self Care | Payer: 59 | Source: Ambulatory Visit | Attending: General Surgery | Admitting: General Surgery

## 2018-07-25 ENCOUNTER — Other Ambulatory Visit: Payer: Self-pay

## 2018-07-25 DIAGNOSIS — Z1159 Encounter for screening for other viral diseases: Secondary | ICD-10-CM | POA: Insufficient documentation

## 2018-07-25 DIAGNOSIS — Z5111 Encounter for antineoplastic chemotherapy: Secondary | ICD-10-CM | POA: Insufficient documentation

## 2018-07-25 DIAGNOSIS — D72819 Decreased white blood cell count, unspecified: Secondary | ICD-10-CM | POA: Insufficient documentation

## 2018-07-25 DIAGNOSIS — C20 Malignant neoplasm of rectum: Secondary | ICD-10-CM | POA: Insufficient documentation

## 2018-07-25 DIAGNOSIS — Z01812 Encounter for preprocedural laboratory examination: Secondary | ICD-10-CM | POA: Insufficient documentation

## 2018-07-25 DIAGNOSIS — Z5112 Encounter for antineoplastic immunotherapy: Secondary | ICD-10-CM | POA: Insufficient documentation

## 2018-07-25 LAB — SARS CORONAVIRUS 2 (TAT 6-24 HRS): SARS Coronavirus 2: NEGATIVE

## 2018-07-25 NOTE — Patient Instructions (Signed)
Oxaliplatin Injection What is this medicine? OXALIPLATIN (ox AL i PLA tin) is a chemotherapy drug. It targets fast dividing cells, like cancer cells, and causes these cells to die. This medicine is used to treat cancers of the colon and rectum, and many other cancers. This medicine may be used for other purposes; ask your health care provider or pharmacist if you have questions. COMMON BRAND NAME(S): Eloxatin What should I tell my health care provider before I take this medicine? They need to know if you have any of these conditions:  kidney disease  an unusual or allergic reaction to oxaliplatin, other chemotherapy, other medicines, foods, dyes, or preservatives  pregnant or trying to get pregnant  breast-feeding How should I use this medicine? This drug is given as an infusion into a vein. It is administered in a hospital or clinic by a specially trained health care professional. Talk to your pediatrician regarding the use of this medicine in children. Special care may be needed. Overdosage: If you think you have taken too much of this medicine contact a poison control center or emergency room at once. NOTE: This medicine is only for you. Do not share this medicine with others. What if I miss a dose? It is important not to miss a dose. Call your doctor or health care professional if you are unable to keep an appointment. What may interact with this medicine?  medicines to increase blood counts like filgrastim, pegfilgrastim, sargramostim  probenecid  some antibiotics like amikacin, gentamicin, neomycin, polymyxin B, streptomycin, tobramycin  zalcitabine Talk to your doctor or health care professional before taking any of these medicines:  acetaminophen  aspirin  ibuprofen  ketoprofen  naproxen This list may not describe all possible interactions. Give your health care provider a list of all the medicines, herbs, non-prescription drugs, or dietary supplements you use. Also  tell them if you smoke, drink alcohol, or use illegal drugs. Some items may interact with your medicine. What should I watch for while using this medicine? Your condition will be monitored carefully while you are receiving this medicine. You will need important blood work done while you are taking this medicine. This medicine can make you more sensitive to cold. Do not drink cold drinks or use ice. Cover exposed skin before coming in contact with cold temperatures or cold objects. When out in cold weather wear warm clothing and cover your mouth and nose to warm the air that goes into your lungs. Tell your doctor if you get sensitive to the cold. This drug may make you feel generally unwell. This is not uncommon, as chemotherapy can affect healthy cells as well as cancer cells. Report any side effects. Continue your course of treatment even though you feel ill unless your doctor tells you to stop. In some cases, you may be given additional medicines to help with side effects. Follow all directions for their use. Call your doctor or health care professional for advice if you get a fever, chills or sore throat, or other symptoms of a cold or flu. Do not treat yourself. This drug decreases your body's ability to fight infections. Try to avoid being around people who are sick. This medicine may increase your risk to bruise or bleed. Call your doctor or health care professional if you notice any unusual bleeding. Be careful brushing and flossing your teeth or using a toothpick because you may get an infection or bleed more easily. If you have any dental work done, tell your dentist you  are receiving this medicine. Avoid taking products that contain aspirin, acetaminophen, ibuprofen, naproxen, or ketoprofen unless instructed by your doctor. These medicines may hide a fever. Do not become pregnant while taking this medicine. Women should inform their doctor if they wish to become pregnant or think they might be  pregnant. There is a potential for serious side effects to an unborn child. Talk to your health care professional or pharmacist for more information. Do not breast-feed an infant while taking this medicine. Call your doctor or health care professional if you get diarrhea. Do not treat yourself. What side effects may I notice from receiving this medicine? Side effects that you should report to your doctor or health care professional as soon as possible:  allergic reactions like skin rash, itching or hives, swelling of the face, lips, or tongue  low blood counts - This drug may decrease the number of white blood cells, red blood cells and platelets. You may be at increased risk for infections and bleeding.  signs of infection - fever or chills, cough, sore throat, pain or difficulty passing urine  signs of decreased platelets or bleeding - bruising, pinpoint red spots on the skin, black, tarry stools, nosebleeds  signs of decreased red blood cells - unusually weak or tired, fainting spells, lightheadedness  breathing problems  chest pain, pressure  cough  diarrhea  jaw tightness  mouth sores  nausea and vomiting  pain, swelling, redness or irritation at the injection site  pain, tingling, numbness in the hands or feet  problems with balance, talking, walking  redness, blistering, peeling or loosening of the skin, including inside the mouth  trouble passing urine or change in the amount of urine Side effects that usually do not require medical attention (report to your doctor or health care professional if they continue or are bothersome):  changes in vision  constipation  hair loss  loss of appetite  metallic taste in the mouth or changes in taste  stomach pain This list may not describe all possible side effects. Call your doctor for medical advice about side effects. You may report side effects to FDA at 1-800-FDA-1088. Where should I keep my medicine? This drug  is given in a hospital or clinic and will not be stored at home. NOTE: This sheet is a summary. It may not cover all possible information. If you have questions about this medicine, talk to your doctor, pharmacist, or health care provider.  2020 Elsevier/Gold Standard (2007-08-06 17:22:47) Fluorouracil, 5-FU injection What is this medicine? FLUOROURACIL, 5-FU (flure oh YOOR a sil) is a chemotherapy drug. It slows the growth of cancer cells. This medicine is used to treat many types of cancer like breast cancer, colon or rectal cancer, pancreatic cancer, and stomach cancer. This medicine may be used for other purposes; ask your health care provider or pharmacist if you have questions. COMMON BRAND NAME(S): Adrucil What should I tell my health care provider before I take this medicine? They need to know if you have any of these conditions:  blood disorders  dihydropyrimidine dehydrogenase (DPD) deficiency  infection (especially a virus infection such as chickenpox, cold sores, or herpes)  kidney disease  liver disease  malnourished, poor nutrition  recent or ongoing radiation therapy  an unusual or allergic reaction to fluorouracil, other chemotherapy, other medicines, foods, dyes, or preservatives  pregnant or trying to get pregnant  breast-feeding How should I use this medicine? This drug is given as an infusion or injection into a vein.  It is administered in a hospital or clinic by a specially trained health care professional. Talk to your pediatrician regarding the use of this medicine in children. Special care may be needed. Overdosage: If you think you have taken too much of this medicine contact a poison control center or emergency room at once. NOTE: This medicine is only for you. Do not share this medicine with others. What if I miss a dose? It is important not to miss your dose. Call your doctor or health care professional if you are unable to keep an appointment. What  may interact with this medicine?  allopurinol  cimetidine  dapsone  digoxin  hydroxyurea  leucovorin  levamisole  medicines for seizures like ethotoin, fosphenytoin, phenytoin  medicines to increase blood counts like filgrastim, pegfilgrastim, sargramostim  medicines that treat or prevent blood clots like warfarin, enoxaparin, and dalteparin  methotrexate  metronidazole  pyrimethamine  some other chemotherapy drugs like busulfan, cisplatin, estramustine, vinblastine  trimethoprim  trimetrexate  vaccines Talk to your doctor or health care professional before taking any of these medicines:  acetaminophen  aspirin  ibuprofen  ketoprofen  naproxen This list may not describe all possible interactions. Give your health care provider a list of all the medicines, herbs, non-prescription drugs, or dietary supplements you use. Also tell them if you smoke, drink alcohol, or use illegal drugs. Some items may interact with your medicine. What should I watch for while using this medicine? Visit your doctor for checks on your progress. This drug may make you feel generally unwell. This is not uncommon, as chemotherapy can affect healthy cells as well as cancer cells. Report any side effects. Continue your course of treatment even though you feel ill unless your doctor tells you to stop. In some cases, you may be given additional medicines to help with side effects. Follow all directions for their use. Call your doctor or health care professional for advice if you get a fever, chills or sore throat, or other symptoms of a cold or flu. Do not treat yourself. This drug decreases your body's ability to fight infections. Try to avoid being around people who are sick. This medicine may increase your risk to bruise or bleed. Call your doctor or health care professional if you notice any unusual bleeding. Be careful brushing and flossing your teeth or using a toothpick because you may  get an infection or bleed more easily. If you have any dental work done, tell your dentist you are receiving this medicine. Avoid taking products that contain aspirin, acetaminophen, ibuprofen, naproxen, or ketoprofen unless instructed by your doctor. These medicines may hide a fever. Do not become pregnant while taking this medicine. Women should inform their doctor if they wish to become pregnant or think they might be pregnant. There is a potential for serious side effects to an unborn child. Talk to your health care professional or pharmacist for more information. Do not breast-feed an infant while taking this medicine. Men should inform their doctor if they wish to father a child. This medicine may lower sperm counts. Do not treat diarrhea with over the counter products. Contact your doctor if you have diarrhea that lasts more than 2 days or if it is severe and watery. This medicine can make you more sensitive to the sun. Keep out of the sun. If you cannot avoid being in the sun, wear protective clothing and use sunscreen. Do not use sun lamps or tanning beds/booths. What side effects may I notice from  receiving this medicine? Side effects that you should report to your doctor or health care professional as soon as possible:  allergic reactions like skin rash, itching or hives, swelling of the face, lips, or tongue  low blood counts - this medicine may decrease the number of white blood cells, red blood cells and platelets. You may be at increased risk for infections and bleeding.  signs of infection - fever or chills, cough, sore throat, pain or difficulty passing urine  signs of decreased platelets or bleeding - bruising, pinpoint red spots on the skin, black, tarry stools, blood in the urine  signs of decreased red blood cells - unusually weak or tired, fainting spells, lightheadedness  breathing problems  changes in vision  chest pain  mouth sores  nausea and vomiting  pain,  swelling, redness at site where injected  pain, tingling, numbness in the hands or feet  redness, swelling, or sores on hands or feet  stomach pain  unusual bleeding Side effects that usually do not require medical attention (report to your doctor or health care professional if they continue or are bothersome):  changes in finger or toe nails  diarrhea  dry or itchy skin  hair loss  headache  loss of appetite  sensitivity of eyes to the light  stomach upset  unusually teary eyes This list may not describe all possible side effects. Call your doctor for medical advice about side effects. You may report side effects to FDA at 1-800-FDA-1088. Where should I keep my medicine? This drug is given in a hospital or clinic and will not be stored at home. NOTE: This sheet is a summary. It may not cover all possible information. If you have questions about this medicine, talk to your doctor, pharmacist, or health care provider.  2020 Elsevier/Gold Standard (2007-05-15 13:53:16) Leucovorin injection What is this medicine? LEUCOVORIN (loo koe VOR in) is used to prevent or treat the harmful effects of some medicines. This medicine is used to treat anemia caused by a low amount of folic acid in the body. It is also used with 5-fluorouracil (5-FU) to treat colon cancer. This medicine may be used for other purposes; ask your health care provider or pharmacist if you have questions. What should I tell my health care provider before I take this medicine? They need to know if you have any of these conditions:  anemia from low levels of vitamin B-12 in the blood  an unusual or allergic reaction to leucovorin, folic acid, other medicines, foods, dyes, or preservatives  pregnant or trying to get pregnant  breast-feeding How should I use this medicine? This medicine is for injection into a muscle or into a vein. It is given by a health care professional in a hospital or clinic setting. Talk  to your pediatrician regarding the use of this medicine in children. Special care may be needed. Overdosage: If you think you have taken too much of this medicine contact a poison control center or emergency room at once. NOTE: This medicine is only for you. Do not share this medicine with others. What if I miss a dose? This does not apply. What may interact with this medicine?  capecitabine  fluorouracil  phenobarbital  phenytoin  primidone  trimethoprim-sulfamethoxazole This list may not describe all possible interactions. Give your health care provider a list of all the medicines, herbs, non-prescription drugs, or dietary supplements you use. Also tell them if you smoke, drink alcohol, or use illegal drugs. Some items may  interact with your medicine. What should I watch for while using this medicine? Your condition will be monitored carefully while you are receiving this medicine. This medicine may increase the side effects of 5-fluorouracil, 5-FU. Tell your doctor or health care professional if you have diarrhea or mouth sores that do not get better or that get worse. What side effects may I notice from receiving this medicine? Side effects that you should report to your doctor or health care professional as soon as possible:  allergic reactions like skin rash, itching or hives, swelling of the face, lips, or tongue  breathing problems  fever, infection  mouth sores  unusual bleeding or bruising  unusually weak or tired Side effects that usually do not require medical attention (report to your doctor or health care professional if they continue or are bothersome):  constipation or diarrhea  loss of appetite  nausea, vomiting This list may not describe all possible side effects. Call your doctor for medical advice about side effects. You may report side effects to FDA at 1-800-FDA-1088. Where should I keep my medicine? This drug is given in a hospital or clinic and  will not be stored at home. NOTE: This sheet is a summary. It may not cover all possible information. If you have questions about this medicine, talk to your doctor, pharmacist, or health care provider.  2020 Elsevier/Gold Standard (2007-07-16 16:50:29)

## 2018-07-27 DIAGNOSIS — Z7189 Other specified counseling: Secondary | ICD-10-CM | POA: Insufficient documentation

## 2018-07-27 NOTE — Progress Notes (Signed)
[place]  [phone] [phone]  ID: Lochlin Eppinger [place] OB: November 06, 1955  MR#: [id number]  URK#:[id number]  Patient Care Team: Hubbard Hartshorn, FNP as PCP - General (Family Medicine) Marlyce Huge, MD as Attending Physician (Surgery) Lloyd Huger, MD as Consulting Physician (Oncology) Abbie Sons, MD (Urology) Eulogio Bear, MD as Consulting Physician (Ophthalmology) Flora Lipps, MD as Consulting Physician (Pulmonary Disease)   CHIEF COMPLAINT: Recurrent, stage IV rectal adenocarcinoma.  INTERVAL HISTORY: Patient returns to clinic today for further evaluation and consideration of cycle 1 of FOLFOX plus Avastin.  He currently feels well and is asymptomatic. He has no issues with his colostomy.  He has no neurologic complaints.  He denies any recent fevers or illnesses.  He denies any chest pain, shortness of breath, cough, or hemoptysis.  He denies nausea, vomiting, constipation, or diarrhea. He has noted no blood or melena in his colostomy bag.  He has no urinary complaints.  Patient offers no specific complaints today.  REVIEW OF SYSTEMS:   Review of Systems  Constitutional: Negative.  Negative for fever, malaise/fatigue and weight loss.  Respiratory: Negative.  Negative for cough and shortness of breath.   Cardiovascular: Negative.  Negative for chest pain and leg swelling.  Gastrointestinal: Negative.  Negative for abdominal pain, blood in stool, constipation, diarrhea and melena.  Genitourinary: Negative.  Negative for dysuria.  Musculoskeletal: Negative.  Negative for back pain.  Skin: Negative.  Negative for rash.  Neurological: Negative.  Negative for focal weakness, weakness and headaches.  Psychiatric/Behavioral: Negative.  The patient is not nervous/anxious.     As per HPI. Otherwise, a complete review of systems is negative.  PAST MEDICAL HISTORY: Past Medical History:  Diagnosis Date   Anemia    Cancer  ([place])    rectal 2015, followed by Oncology, permanent colostomy   Colostomy in place [facility])    History of kidney stones    Seasonal allergies     PAST SURGICAL HISTORY: Past Surgical History:  Procedure Laterality Date   BACK SURGERY     CHOLECYSTECTOMY     COLON SURGERY     ENDOBRONCHIAL ULTRASOUND N/A 07/01/2018   Procedure: ENDOBRONCHIAL ULTRASOUND;  Surgeon: Tyler Pita, MD;  Location: [facility] ORS;  Service: Cardiopulmonary;  Laterality: N/A;   KNEE SURGERY Bilateral    PORT-A-CATH REMOVAL Right 12/02/2014   Procedure: REMOVAL PORT-A-CATH;  Surgeon: Marlyce Huge, MD;  Location: [facility] ORS;  Service: General;  Laterality: Right;   PORTACATH PLACEMENT     PORTACATH PLACEMENT Left 07/29/2018   Procedure: INSERTION PORT-A-CATH LEFT;  Surgeon: Robert Bellow, MD;  Location: [facility] ORS;  Service: General;  Laterality: Left;    FAMILY HISTORY: Father with prostate cancer.  COPD, CHF.     ADVANCED DIRECTIVES:    HEALTH MAINTENANCE: Social History   Tobacco Use   Smoking status: Never Smoker   Smokeless tobacco: Current User    Types: Snuff   Tobacco comment: might smoke a cigar once every 3 months  Substance Use Topics   Alcohol use: Yes    Alcohol/week: 0.0 standard drinks    Comment: RARE   Drug use: No     Colonoscopy:  PAP:  Bone density:  Lipid panel:  Allergies  Allergen Reactions   Sulfa Antibiotics Hives    Other reaction(s): Other (qualifier value)    Current Outpatient Medications  Medication Sig Dispense Refill   albuterol (VENTOLIN HFA) 108 (90 Base) MCG/ACT inhaler Inhale 2 puffs into the  lungs every 6 (six) hours as needed for wheezing or shortness of breath (cough). 1 Inhaler 2   cetirizine (ZYRTEC) 10 MG tablet Take 10 mg by mouth at bedtime.      chlorpheniramine-HYDROcodone (TUSSIONEX PENNKINETIC ER) 10-8 MG/5ML SUER Take 5 mLs by mouth every 12 (twelve) hours as needed for cough. 200 mL 0   gabapentin (NEURONTIN)  100 MG capsule Take 1 capsule once daily before bed x2 days, then take 2 capsules once daily before bed x2 days, then may increase to 3 capsules once daily before bed if needed. (Patient taking differently: Take 200 mg by mouth at bedtime. ) 90 capsule 1   guaiFENesin (MUCINEX) 600 MG 12 hr tablet Take 1 tablet (600 mg total) by mouth 2 (two) times daily. (Patient not taking: Reported on 08/02/2018) 20 tablet 0   lidocaine-prilocaine (EMLA) cream Apply to affected area once (Patient taking differently: Apply 1 application topically daily as needed (prior to port being accessed.). ) 30 g 3   loperamide (IMODIUM A-D) 2 MG capsule Take 2 mg by mouth as needed for diarrhea or loose stools.      NEEDLE, DISP, 18 G 18G X 1" MISC Use as directed 50 each 0   ondansetron (ZOFRAN) 8 MG tablet Take 1 tablet (8 mg total) by mouth 2 (two) times daily as needed for refractory nausea / vomiting. 30 tablet 2   prochlorperazine (COMPAZINE) 10 MG tablet Take 1 tablet (10 mg total) by mouth every 6 (six) hours as needed (Nausea or vomiting). 60 tablet 2   SYRINGE-NEEDLE, DISP, 3 ML (LUER LOCK SAFETY SYRINGES) 21G X 1-1/2" 3 ML MISC Use as directed for testosterone administration 50 each 0   tadalafil (ADCIRCA/CIALIS) 20 MG tablet Take 1 tablet (20 mg total) by mouth daily as needed for erectile dysfunction. 6 tablet 10   testosterone cypionate (DEPOTESTOSTERONE CYPIONATE) 200 MG/ML injection Inject 1 mL (200 mg total) into the muscle every 14 (fourteen) days. 10 mL 0   timolol (BETIMOL) 0.5 % ophthalmic solution Place 1 drop into the right eye at bedtime.      Dextromethorphan-guaiFENesin (MUCINEX DM MAXIMUM STRENGTH) 60-1200 MG TB12 Take 1 tablet by mouth at bedtime as needed (congestion/drainage.).     Multiple Vitamin (MULTIVITAMIN WITH MINERALS) TABS tablet Take 1 tablet by mouth at bedtime.     Oxymetazoline HCl (AFRIN EXTRA MOISTURIZING NA) Place 1 spray into the nose 2 (two) times daily as needed  (congestion.).     No current facility-administered medications for this visit.     OBJECTIVE: Vitals:   07/31/18 0918  BP: (!) 157/104  Pulse: (!) 103  Temp: (!) 97.4 F (36.3 C)     Body mass index is 33.44 kg/m.    ECOG FS:0 - Asymptomatic  General: Well-developed, well-nourished, no acute distress. Eyes: Pink conjunctiva, anicteric sclera. HEENT: Normocephalic, moist mucous membranes. Lungs: Clear to auscultation bilaterally. Heart: Regular rate and rhythm. No rubs, murmurs, or gallops. Abdomen: Soft, nontender, nondistended. No organomegaly noted, normoactive bowel sounds.  Colostomy bag noted. Musculoskeletal: No edema, cyanosis, or clubbing. Neuro: Alert, answering all questions appropriately. Cranial nerves grossly intact. Skin: No rashes or petechiae noted. Psych: Normal affect.  LAB RESULTS:  Lab Results  Component Value Date   NA 141 03/22/2018   K 4.6 03/22/2018   CL 106 03/22/2018   CO2 26 03/22/2018   GLUCOSE 80 03/22/2018   BUN 13 03/22/2018   CREATININE 1.20 06/13/2018   CALCIUM 9.7 03/22/2018   PROT 6.6  03/22/2018   ALBUMIN 3.9 07/03/2016   AST 17 03/22/2018   ALT 12 03/22/2018   ALKPHOS 58 07/03/2016   BILITOT 0.3 03/22/2018   GFRNONAA 66 03/22/2018   GFRAA 77 03/22/2018    Lab Results  Component Value Date   WBC 3.6 (L) 06/04/2018   NEUTROABS 2.1 06/04/2018   HGB 15.3 06/04/2018   HCT 47.7 06/04/2018   MCV 85.3 06/04/2018   PLT 311 06/04/2018     STUDIES: Dg Chest Port 1 View  Result Date: 07/29/2018 CLINICAL DATA:  Postop Port-A-Cath. EXAM: PORTABLE CHEST 1 VIEW COMPARISON:  Head CT dated 06/24/2018. Chest CT dated 06/13/2018. FINDINGS: LEFT chest wall port a cath is now in place with tip at the level of the upper SVC. Heart size is within normal limits. LEFT upper lobe mass again demonstrated. No new lung findings. No pleural effusion or pneumothorax seen. Osseous structures about the chest are unremarkable. IMPRESSION: LEFT chest wall  Port-A-Cath with tip at the level of the upper SVC. No pneumothorax or other procedural complicating feature seen. Electronically Signed   By: Franki Cabot M.D.   On: 07/29/2018 11:57   Dg Fluoro Guide Cv Line Left  Result Date: 07/31/2018 INDICATION: History of metastatic rectal cancer. Patient underwent surgical placement of a left subclavian vein approach port a catheter on 07/29/2018 however presented today for his initial chemotherapy session with difficulty aspirating and flushing from the recently placed port a catheter. As such, patient presents today for fluoroscopic guided Port a catheter evaluation. EXAM: FLUOROSCOPIC GUIDED PORT A CATHETER CHECK MEDICATIONS: None. CONTRAST:  None FLUOROSCOPY TIME:  18 seconds (2.6 mGy) COMPLICATIONS: None immediate. TECHNIQUE: The procedure, risks, benefits, and alternatives were explained to the patient and informed written consent was obtained. A timeout was performed prior to the initiation of the procedure. The patient's chest port a catheter was accessed by the IV team. The patient was placed supine on the fluoroscopy table. A preprocedural spot fluoroscopic image was obtained of the chest in existing port a catheter. Note was made of difficulty aspirating blood from the port a catheter. Contrast was injected via the Port a catheter and images were reviewed. The Port a catheter was flushed with a heparin dwell and de accessed. A dressing was placed. The patient tolerated the procedure well without immediate postprocedural complication. FINDINGS: Preprocedural spot fluoroscopic image demonstrates tip of left subclavian vein approach port a catheter tip lying horizontally over the superior aspect of the SVC. Note is also made of an abrupt kink peripheral to the suspected left subclavian vein venotomy site. Note was made of inability to aspirate from the port a catheter. Contrast injection was also noted to be difficult, presumably secondary to the abrupt kinking  of the mid aspect of the Port a catheter tubing. Injection demonstrates the tip of the Port a catheter tubing is indeed located within the superior aspect of the SVC with tip in close proximity to the right lateral wall of the SVC as well as the origin of the azygos vein. No discrete areas of contrast extravasation. IMPRESSION: Difficulty aspirating from the recently placed subclavian port catheter is multifactorial including suboptimal positioning of the Port a catheter tip which resides in close proximity to the right lateral wall the SVC and origin of the azygos as well as abrupt kinking of the Port a catheter tubing peripheral to it's entrance into the left subclavian vein. Based on the above, port revision is recommended. Above findings discussed with Dr. Grayland Ormond at the  time of examination completion. Electronically Signed   By: Sandi Mariscal M.D.   On: 07/31/2018 13:10   Dg C-arm 1-60 Min-no Report  Result Date: 07/29/2018 Fluoroscopy was utilized by the requesting physician.  No radiographic interpretation.    ASSESSMENT: Recurrent, stage IV rectal adenocarcinoma.  PLAN:    1.  Recurrent, stage IV rectal adenocarcinoma: Patient was initially considered a clinical stage IIIa, but after completing neoadjuvant 5-FU and XRT followed by surgical excision on October 08, 2013 he was noted to be pathologic stage Ia.  He declined adjuvant FOLFOX at that time.  More recently, patient's CEA started trending up and CT of the chest revealed a large 7.7 cm left upper lobe mass biopsy consistent with rectal adenocarcinoma.  PET scan completed on June 24, 2018 did not reveal any other evidence of disease.  Case was discussed with thoracic surgery and given the size and location of his mass, surgical resection is not possible.  Treatment with XRT was also discussed, but it was agreed upon that patient should pursue FOLFOX plus Avastin as initial treatment and consider XRT later if there is any residual disease.   Unfortunately, patient's port was unable to flush appropriately and dye study indicated malposition of tubing.  Patient will have a port revision next week and then return to clinic in 1 week for further evaluation and a retry of cycle 1 of FOLFOX plus Avastin.  2.  Leukopenia: Chronic and unchanged. 3.  Hyphae seen on biopsy: Serum galactomannan has been ordered but not yet drawn.   Patient expressed understanding and was in agreement with this plan. He also understands that He can call clinic at any time with any questions, concerns, or complaints.    Lloyd Huger, MD   08/03/2018 7:06 AM

## 2018-07-29 ENCOUNTER — Ambulatory Visit: Payer: 59

## 2018-07-29 ENCOUNTER — Encounter: Payer: Self-pay | Admitting: *Deleted

## 2018-07-29 ENCOUNTER — Ambulatory Visit
Admission: RE | Admit: 2018-07-29 | Discharge: 2018-07-29 | Disposition: A | Discharge status: Home / Self Care | Payer: 59 | Attending: General Surgery | Admitting: General Surgery

## 2018-07-29 ENCOUNTER — Encounter
Admission: RE | Disposition: A | Discharge status: Home / Self Care | Payer: Self-pay | Source: Home / Self Care | Attending: General Surgery

## 2018-07-29 ENCOUNTER — Other Ambulatory Visit: Payer: Self-pay

## 2018-07-29 ENCOUNTER — Ambulatory Visit: Payer: 59 | Admitting: Certified Registered Nurse Anesthetist

## 2018-07-29 DIAGNOSIS — Z933 Colostomy status: Secondary | ICD-10-CM | POA: Diagnosis not present

## 2018-07-29 DIAGNOSIS — Z760 Encounter for issue of repeat prescription: Secondary | ICD-10-CM | POA: Diagnosis not present

## 2018-07-29 DIAGNOSIS — H409 Unspecified glaucoma: Secondary | ICD-10-CM | POA: Diagnosis not present

## 2018-07-29 DIAGNOSIS — C20 Malignant neoplasm of rectum: Secondary | ICD-10-CM | POA: Diagnosis not present

## 2018-07-29 DIAGNOSIS — M1990 Unspecified osteoarthritis, unspecified site: Secondary | ICD-10-CM | POA: Insufficient documentation

## 2018-07-29 DIAGNOSIS — Z79899 Other long term (current) drug therapy: Secondary | ICD-10-CM | POA: Insufficient documentation

## 2018-07-29 DIAGNOSIS — Z95828 Presence of other vascular implants and grafts: Secondary | ICD-10-CM

## 2018-07-29 DIAGNOSIS — Z6832 Body mass index (BMI) 32.0-32.9, adult: Secondary | ICD-10-CM | POA: Insufficient documentation

## 2018-07-29 DIAGNOSIS — C7800 Secondary malignant neoplasm of unspecified lung: Secondary | ICD-10-CM | POA: Insufficient documentation

## 2018-07-29 DIAGNOSIS — D649 Anemia, unspecified: Secondary | ICD-10-CM | POA: Diagnosis not present

## 2018-07-29 DIAGNOSIS — Z452 Encounter for adjustment and management of vascular access device: Secondary | ICD-10-CM | POA: Diagnosis not present

## 2018-07-29 HISTORY — PX: PORTACATH PLACEMENT: SHX2246

## 2018-07-29 SURGERY — INSERTION, TUNNELED CENTRAL VENOUS DEVICE, WITH PORT
Anesthesia: General | Laterality: Left

## 2018-07-29 MED ORDER — SODIUM CHLORIDE (PF) 0.9 % IJ SOLN
INTRAMUSCULAR | Status: AC
  Filled 2018-07-29: qty 50

## 2018-07-29 MED ORDER — LIDOCAINE HCL 1 % IJ SOLN
INTRAMUSCULAR | Status: DC | PRN
  Administered 2018-07-29: 10 mL

## 2018-07-29 MED ORDER — LACTATED RINGERS IV SOLN
INTRAVENOUS | Status: DC
  Administered 2018-07-29: 09:00:00 via INTRAVENOUS

## 2018-07-29 MED ORDER — MIDAZOLAM HCL 2 MG/2ML IJ SOLN
INTRAMUSCULAR | Status: DC | PRN
  Administered 2018-07-29: 2 mg via INTRAVENOUS

## 2018-07-29 MED ORDER — CEFAZOLIN SODIUM-DEXTROSE 2-4 GM/100ML-% IV SOLN
INTRAVENOUS | Status: AC
  Filled 2018-07-29: qty 100

## 2018-07-29 MED ORDER — FENTANYL CITRATE (PF) 100 MCG/2ML IJ SOLN
INTRAMUSCULAR | Status: AC
  Filled 2018-07-29: qty 2

## 2018-07-29 MED ORDER — MIDAZOLAM HCL 2 MG/2ML IJ SOLN
INTRAMUSCULAR | Status: AC
  Filled 2018-07-29: qty 2

## 2018-07-29 MED ORDER — FENTANYL CITRATE (PF) 100 MCG/2ML IJ SOLN
INTRAMUSCULAR | Status: DC | PRN
  Administered 2018-07-29: 25 ug via INTRAVENOUS

## 2018-07-29 MED ORDER — ONDANSETRON HCL 4 MG/2ML IJ SOLN: 4.0000 mg | Freq: Once | INTRAMUSCULAR | Status: DC | PRN

## 2018-07-29 MED ORDER — CEFAZOLIN SODIUM-DEXTROSE 2-4 GM/100ML-% IV SOLN
2.0000 g | INTRAVENOUS | Status: AC
  Administered 2018-07-29: 11:00:00 2 g via INTRAVENOUS

## 2018-07-29 MED ORDER — ONDANSETRON HCL 4 MG/2ML IJ SOLN
INTRAMUSCULAR | Status: DC | PRN
  Administered 2018-07-29: 4 mg via INTRAVENOUS

## 2018-07-29 MED ORDER — LIDOCAINE HCL (PF) 1 % IJ SOLN
INTRAMUSCULAR | Status: AC
  Filled 2018-07-29: qty 30

## 2018-07-29 MED ORDER — LIDOCAINE HCL (CARDIAC) PF 100 MG/5ML IV SOSY
PREFILLED_SYRINGE | INTRAVENOUS | Status: DC | PRN
  Administered 2018-07-29: 80 mg via INTRAVENOUS

## 2018-07-29 MED ORDER — ONDANSETRON HCL 4 MG/2ML IJ SOLN
INTRAMUSCULAR | Status: AC
  Filled 2018-07-29: qty 2

## 2018-07-29 MED ORDER — FENTANYL CITRATE (PF) 100 MCG/2ML IJ SOLN: 25.0000 ug | INTRAMUSCULAR | Status: DC | PRN

## 2018-07-29 MED ORDER — PROPOFOL 10 MG/ML IV BOLUS
INTRAVENOUS | Status: DC | PRN
  Administered 2018-07-29: 30 mg via INTRAVENOUS

## 2018-07-29 MED ORDER — FAMOTIDINE 20 MG PO TABS
20.0000 mg | ORAL_TABLET | Freq: Once | ORAL | Status: AC
  Administered 2018-07-29: 09:00:00 20 mg via ORAL

## 2018-07-29 MED ORDER — ACETAMINOPHEN 10 MG/ML IV SOLN
INTRAVENOUS | Status: AC
  Filled 2018-07-29: qty 100

## 2018-07-29 MED ORDER — PROPOFOL 500 MG/50ML IV EMUL
INTRAVENOUS | Status: DC | PRN
  Administered 2018-07-29: 75 ug/kg/min via INTRAVENOUS

## 2018-07-29 MED ORDER — FAMOTIDINE 20 MG PO TABS
ORAL_TABLET | ORAL | Status: AC
  Administered 2018-07-29: 09:00:00 20 mg via ORAL
  Filled 2018-07-29: qty 1

## 2018-07-29 SURGICAL SUPPLY — 30 items
BLADE SURG 15 STRL SS SAFETY (BLADE) ×3 IMPLANT
CHLORAPREP W/TINT 26 (MISCELLANEOUS) ×3 IMPLANT
CLOSURE WOUND 1/2 X4 (GAUZE/BANDAGES/DRESSINGS) ×1
COVER LIGHT HANDLE STERIS (MISCELLANEOUS) ×6 IMPLANT
COVER WAND RF STERILE (DRAPES) ×3 IMPLANT
DECANTER SPIKE VIAL GLASS SM (MISCELLANEOUS) ×6 IMPLANT
DRAPE C-ARM XRAY 36X54 (DRAPES) ×3 IMPLANT
DRAPE LAPAROTOMY TRNSV 106X77 (MISCELLANEOUS) ×3 IMPLANT
DRSG TEGADERM 2-3/8X2-3/4 SM (GAUZE/BANDAGES/DRESSINGS) ×3 IMPLANT
DRSG TEGADERM 4X4.75 (GAUZE/BANDAGES/DRESSINGS) ×3 IMPLANT
DRSG TELFA 4X3 1S NADH ST (GAUZE/BANDAGES/DRESSINGS) ×3 IMPLANT
ELECT REM PT RETURN 9FT ADLT (ELECTROSURGICAL) ×3
ELECTRODE REM PT RTRN 9FT ADLT (ELECTROSURGICAL) ×1 IMPLANT
GLOVE BIO SURGEON STRL SZ7.5 (GLOVE) ×3 IMPLANT
GLOVE INDICATOR 8.0 STRL GRN (GLOVE) ×3 IMPLANT
GOWN STRL REUS W/ TWL LRG LVL3 (GOWN DISPOSABLE) ×2 IMPLANT
GOWN STRL REUS W/TWL LRG LVL3 (GOWN DISPOSABLE) ×4
KIT PORT POWER 8FR ISP CVUE (Port) ×3 IMPLANT
KIT TURNOVER KIT A (KITS) ×3 IMPLANT
LABEL OR SOLS (LABEL) ×3 IMPLANT
NS IRRIG 500ML POUR BTL (IV SOLUTION) ×3 IMPLANT
PACK PORT-A-CATH (MISCELLANEOUS) ×3 IMPLANT
STRIP CLOSURE SKIN 1/2X4 (GAUZE/BANDAGES/DRESSINGS) ×2 IMPLANT
SUT PROLENE 3 0 SH DA (SUTURE) ×3 IMPLANT
SUT VIC AB 3-0 SH 27 (SUTURE) ×2
SUT VIC AB 3-0 SH 27X BRD (SUTURE) ×1 IMPLANT
SUT VIC AB 4-0 FS2 27 (SUTURE) ×3 IMPLANT
SWABSTK COMLB BENZOIN TINCTURE (MISCELLANEOUS) ×3 IMPLANT
SYR 10ML LL (SYRINGE) ×3 IMPLANT
SYR 10ML SLIP (SYRINGE) ×3 IMPLANT

## 2018-07-29 NOTE — Anesthesia Post-op Follow-up Note (Signed)
Anesthesia QCDR form completed.        

## 2018-07-29 NOTE — OR Nursing (Signed)
PER DR. BYRNETT, OKAY TO DISCHARGE PT TO HOME WITHOUT HIS VISIT IN POSTOP.

## 2018-07-29 NOTE — H&P (Signed)
No change in clinical history or exam.  For left power port placement.

## 2018-07-29 NOTE — Discharge Instructions (Signed)
AMBULATORY SURGERY  °DISCHARGE INSTRUCTIONS ° ° °1) The drugs that you were given will stay in your system until tomorrow so for the next 24 hours you should not: ° °A) Drive an automobile °B) Make any legal decisions °C) Drink any alcoholic beverage ° ° °2) You may resume regular meals tomorrow.  Today it is better to start with liquids and gradually work up to solid foods. ° °You may eat anything you prefer, but it is better to start with liquids, then soup and crackers, and gradually work up to solid foods. ° ° °3) Please notify your doctor immediately if you have any unusual bleeding, trouble breathing, redness and pain at the surgery site, drainage, fever, or pain not relieved by medication. ° ° ° °4) Additional Instructions: ° ° ° ° ° ° ° °Please contact your physician with any problems or Same Day Surgery at [phone], Monday through Friday 6 am to 4 pm, or [place] at [place] Main number at [phone]. °

## 2018-07-29 NOTE — Transfer of Care (Signed)
Immediate Anesthesia Transfer of Care Note  Patient: Valentin Benney Chieffo  Procedure(s) Performed: INSERTION PORT-A-CATH LEFT (Left )  Patient Location: PACU  Anesthesia Type:General  Level of Consciousness: drowsy and patient cooperative  Airway & Oxygen Therapy: Patient Spontanous Breathing and Patient connected to face mask oxygen  Post-op Assessment: Report given to RN and Post -op Vital signs reviewed and stable  Post vital signs: Reviewed and stable  Last Vitals:  Vitals Value Taken Time  BP 126/59 07/29/18 1130  Temp 36.3 C 07/29/18 1130  Pulse 70 07/29/18 1132  Resp 12 07/29/18 1132  SpO2 100 % 07/29/18 1132  Vitals shown include unvalidated device data.  Last Pain:  Vitals:   07/29/18 0850  PainSc: 0-No pain         Complications: No apparent anesthesia complications

## 2018-07-29 NOTE — Anesthesia Preprocedure Evaluation (Signed)
Anesthesia Evaluation  Patient identified by MRN, date of birth, ID band Patient awake    Reviewed: Allergy & Precautions, H&P , NPO status , Patient's Chart, lab work & pertinent test results, reviewed documented beta blocker date and time   History of Anesthesia Complications Negative for: history of anesthetic complications  Airway Mallampati: II  TM Distance: >3 FB Neck ROM: full    Dental no notable dental hx. (+) Caps, Teeth Intact, Missing   Pulmonary neg pulmonary ROS,    Pulmonary exam normal breath sounds clear to auscultation       Cardiovascular Exercise Tolerance: Good negative cardio ROS Normal cardiovascular exam Rhythm:regular Rate:Normal     Neuro/Psych negative neurological ROS  negative psych ROS   GI/Hepatic Neg liver ROS,   Endo/Other  neg diabetesMorbid obesity  Renal/GU Renal disease: kidney stones.stones  negative genitourinary   Musculoskeletal  (+) Arthritis , Osteoarthritis,    Abdominal   Peds  Hematology  (+) Blood dyscrasia, anemia ,   Anesthesia Other Findings Past Medical History:   Anemia                                                       Kidney stones                                                Cancer ([place])                                                   Comment:rectal 2015   Colostomy in place ([place])                                     Seasonal allergies                                           Reproductive/Obstetrics negative OB ROS                             Anesthesia Physical  Anesthesia Plan  ASA: III  Anesthesia Plan: General   Post-op Pain Management:    Induction:   PONV Risk Score and Plan: Propofol infusion and TIVA  Airway Management Planned: Nasal Cannula  Additional Equipment:   Intra-op Plan:   Post-operative Plan:   Informed Consent: I have reviewed the patients History and Physical, chart, labs and  discussed the procedure including the risks, benefits and alternatives for the proposed anesthesia with the patient or authorized representative who has indicated his/her understanding and acceptance.     Dental Advisory Given  Plan Discussed with: Anesthesiologist, CRNA and Surgeon  Anesthesia Plan Comments:         Anesthesia Quick Evaluation

## 2018-07-29 NOTE — Op Note (Signed)
Preoperative diagnosis: Recurrent rectal cancer a candidate for neoadjuvant chemotherapy.  Postoperative diagnosis: Same.  Operative procedure: Left subclavian PowerPort placement with ultrasound and fluoroscopic guidance.  Operating Surgeon: Hervey Ard, MD.  Anesthesia: Attended local, 10 cc 1% plain Xylocaine.  Estimated blood loss: Minimal.  Clinical note: This 63 year old male developed an isolated pulmonary metastasis 4-1/2 years after treatment for rectal cancer.  He is a candidate for neoadjuvant chemotherapy and possible resection.  Venous access was requested by his treating oncologist.  Patient received Ancef prior to the procedure.    Operative note: With the patient under adequate sedation the left neck and chest was cleansed with ChloraPrep and draped.  Hair had previously been removed with clippers.  Ultrasound was used to confirm patency of the subclavian vein.  This was cannulated under ultrasound guidance.  Guidewire was passed into the IVC.  The dilator was placed and under fluoroscopy the catheter tip positioned at the junction of the SVC and right atrium.  The filter was tunneled to a pocket on the left anterior chest.  It was anchored to the port with the provided locking mechanism.  The port was sutured to the deep tissue with 3-0 Prolene sutures x2.  The subcutaneous tissue was approximated with a running 3-0 Vicryl suture.  The skin was closed with a running 4-0 Vicryl subcuticular suture.  At the insertion site the catheter was abutting up against the skin and the catheter site was closed with a single 4-0 Vicryl subcuticular suture.  Benzoin Steri-Strips were applied.  The catheter was accessed and easily irrigated and aspirated with the patient in the supine position.  Erect portable chest x-ray was obtained in the recovery room.  This showed the catheter tip as described above and no evidence of pneumothorax.

## 2018-07-30 ENCOUNTER — Encounter: Payer: Self-pay | Admitting: General Surgery

## 2018-07-30 NOTE — Anesthesia Postprocedure Evaluation (Signed)
Anesthesia Post Note  Patient: [name] [name]  Procedure(s) Performed: INSERTION PORT-A-CATH LEFT (Left )  Patient location during evaluation: PACU Anesthesia Type: General Level of consciousness: awake and alert and oriented Pain management: pain level controlled Vital Signs Assessment: post-procedure vital signs reviewed and stable Respiratory status: spontaneous breathing Cardiovascular status: blood pressure returned to baseline Anesthetic complications: no     Last Vitals:  Vitals:   07/29/18 1235 07/29/18 1249  BP: 140/82 (!) 149/85  Pulse: 66 74  Resp: 16 16  Temp: (!) 36.2 C   SpO2: 97% 96%    Last Pain:  Vitals:   07/30/18 1105  TempSrc:   PainSc: 1                  Elleanna Melling

## 2018-07-31 ENCOUNTER — Inpatient Hospital Stay (HOSPITAL_BASED_OUTPATIENT_CLINIC_OR_DEPARTMENT_OTHER): Payer: 59 | Admitting: Oncology

## 2018-07-31 ENCOUNTER — Other Ambulatory Visit: Payer: Self-pay

## 2018-07-31 ENCOUNTER — Ambulatory Visit
Admission: RE | Admit: 2018-07-31 | Discharge: 2018-07-31 | Disposition: A | Discharge status: Home / Self Care | Payer: 59 | Source: Ambulatory Visit | Attending: Oncology | Admitting: Oncology

## 2018-07-31 ENCOUNTER — Encounter: Payer: Self-pay | Admitting: Oncology

## 2018-07-31 ENCOUNTER — Inpatient Hospital Stay: Payer: 59 | Admitting: *Deleted

## 2018-07-31 ENCOUNTER — Inpatient Hospital Stay: Payer: 59

## 2018-07-31 VITALS — BP 157/104 | HR 103 | Temp 97.4°F | Ht 75.0 in | Wt 267.5 lb

## 2018-07-31 DIAGNOSIS — C20 Malignant neoplasm of rectum: Secondary | ICD-10-CM | POA: Diagnosis not present

## 2018-07-31 DIAGNOSIS — D72819 Decreased white blood cell count, unspecified: Secondary | ICD-10-CM

## 2018-07-31 DIAGNOSIS — T82598A Other mechanical complication of other cardiac and vascular devices and implants, initial encounter: Secondary | ICD-10-CM | POA: Diagnosis not present

## 2018-07-31 DIAGNOSIS — Z7189 Other specified counseling: Secondary | ICD-10-CM

## 2018-07-31 DIAGNOSIS — Z95828 Presence of other vascular implants and grafts: Secondary | ICD-10-CM

## 2018-07-31 DIAGNOSIS — Z5111 Encounter for antineoplastic chemotherapy: Secondary | ICD-10-CM | POA: Diagnosis not present

## 2018-07-31 DIAGNOSIS — Z5112 Encounter for antineoplastic immunotherapy: Secondary | ICD-10-CM | POA: Diagnosis not present

## 2018-07-31 MED ORDER — HEPARIN SOD (PORK) LOCK FLUSH 100 UNIT/ML IV SOLN
INTRAVENOUS | Status: AC
  Filled 2018-07-31: qty 5

## 2018-07-31 MED ORDER — IOHEXOL 300 MG/ML  SOLN
5.0000 mL | Freq: Once | INTRAMUSCULAR | Status: AC | PRN
  Administered 2018-07-31: 5 mL

## 2018-07-31 MED ORDER — SODIUM CHLORIDE 0.9% FLUSH
10.0000 mL | Freq: Once | INTRAVENOUS | Status: AC
  Administered 2018-07-31: 10 mL via INTRAVENOUS
  Filled 2018-07-31: qty 10

## 2018-07-31 NOTE — Procedures (Signed)
Pre Procedure Dx: Poor venous access; Malfunctioning port Post Procedural Dx: Same  Port injection impression: Difficulty aspirating from the recently placed subclavian port catheter is multifactorial including suboptimal positioning of the Port a catheter tip which resides in close proximity to the right lateral wall the SVC and origin of the azygos, as well as abrupt kinking of the Port a catheter tubing peripheral to it's entrance into the left subclavian vein.   Based on the above, port revision is recommended.   Estimated Blood Loss: Minimal Complications: None immediate.  Above findings discussed with Dr. Grayland Ormond at the time of examination completion.   Ronny Bacon, MD Pager #: [phone]

## 2018-07-31 NOTE — Progress Notes (Signed)
Patient stated that he had been doing well with no complaints of his port-a-cath being placed. However, today the port didn't want to work.

## 2018-08-01 ENCOUNTER — Other Ambulatory Visit
Admission: RE | Admit: 2018-08-01 | Discharge: 2018-08-01 | Disposition: A | Discharge status: Home / Self Care | Payer: 59 | Source: Ambulatory Visit | Attending: General Surgery | Admitting: General Surgery

## 2018-08-01 ENCOUNTER — Telehealth: Payer: Self-pay | Admitting: *Deleted

## 2018-08-01 ENCOUNTER — Other Ambulatory Visit: Payer: Self-pay | Admitting: General Surgery

## 2018-08-01 DIAGNOSIS — Z01812 Encounter for preprocedural laboratory examination: Secondary | ICD-10-CM | POA: Diagnosis not present

## 2018-08-01 DIAGNOSIS — Z1159 Encounter for screening for other viral diseases: Secondary | ICD-10-CM | POA: Diagnosis not present

## 2018-08-01 DIAGNOSIS — C20 Malignant neoplasm of rectum: Secondary | ICD-10-CM

## 2018-08-01 NOTE — Telephone Encounter (Signed)
Message left for patient to call the office.   Dr. Bary Castilla wanted to get him in for reposition of left power port for Monday, 08-05-18.  Patient will need to have COVID testing repeated per the Pre-admission Testing Department today.   The patient will need to call [facility] to get arrival time on Friday, 08-02-18 between 1 and 3 pm.  He will need to be NPO after midnight and have a driver. No visitors.

## 2018-08-01 NOTE — Telephone Encounter (Signed)
Patient called the office back and spoke with Caryl-Lyn.   The patient is aware per previous message and verbalizes understanding.

## 2018-08-01 NOTE — Telephone Encounter (Signed)
Tried to reach patient on cell phone again and no answer.   Did call and leave a message on his wife's phone for patient to call the office.

## 2018-08-02 ENCOUNTER — Inpatient Hospital Stay: Payer: 59

## 2018-08-02 LAB — SARS CORONAVIRUS 2 (TAT 6-24 HRS): SARS Coronavirus 2: NEGATIVE

## 2018-08-04 MED ORDER — CEFAZOLIN SODIUM-DEXTROSE 2-4 GM/100ML-% IV SOLN
2.0000 g | INTRAVENOUS | Status: AC
  Administered 2018-08-05: 11:00:00 2 g via INTRAVENOUS

## 2018-08-05 ENCOUNTER — Ambulatory Visit
Admission: RE | Admit: 2018-08-05 | Discharge: 2018-08-05 | Disposition: A | Discharge status: Home / Self Care | Payer: 59 | Attending: General Surgery | Admitting: General Surgery

## 2018-08-05 ENCOUNTER — Encounter: Payer: Self-pay | Admitting: *Deleted

## 2018-08-05 ENCOUNTER — Encounter
Admission: RE | Disposition: A | Discharge status: Home / Self Care | Payer: Self-pay | Source: Home / Self Care | Attending: General Surgery

## 2018-08-05 ENCOUNTER — Ambulatory Visit: Payer: 59

## 2018-08-05 ENCOUNTER — Ambulatory Visit: Payer: 59 | Admitting: Anesthesiology

## 2018-08-05 ENCOUNTER — Other Ambulatory Visit: Payer: Self-pay

## 2018-08-05 DIAGNOSIS — Z452 Encounter for adjustment and management of vascular access device: Secondary | ICD-10-CM | POA: Diagnosis not present

## 2018-08-05 DIAGNOSIS — Z79890 Hormone replacement therapy (postmenopausal): Secondary | ICD-10-CM | POA: Diagnosis not present

## 2018-08-05 DIAGNOSIS — C7802 Secondary malignant neoplasm of left lung: Secondary | ICD-10-CM | POA: Diagnosis not present

## 2018-08-05 DIAGNOSIS — Z933 Colostomy status: Secondary | ICD-10-CM | POA: Insufficient documentation

## 2018-08-05 DIAGNOSIS — T82514A Breakdown (mechanical) of infusion catheter, initial encounter: Secondary | ICD-10-CM | POA: Diagnosis not present

## 2018-08-05 DIAGNOSIS — Y831 Surgical operation with implant of artificial internal device as the cause of abnormal reaction of the patient, or of later complication, without mention of misadventure at the time of the procedure: Secondary | ICD-10-CM | POA: Diagnosis not present

## 2018-08-05 DIAGNOSIS — F17290 Nicotine dependence, other tobacco product, uncomplicated: Secondary | ICD-10-CM | POA: Insufficient documentation

## 2018-08-05 DIAGNOSIS — D649 Anemia, unspecified: Secondary | ICD-10-CM | POA: Diagnosis not present

## 2018-08-05 DIAGNOSIS — Z79899 Other long term (current) drug therapy: Secondary | ICD-10-CM | POA: Insufficient documentation

## 2018-08-05 DIAGNOSIS — C20 Malignant neoplasm of rectum: Secondary | ICD-10-CM | POA: Insufficient documentation

## 2018-08-05 DIAGNOSIS — J302 Other seasonal allergic rhinitis: Secondary | ICD-10-CM | POA: Diagnosis not present

## 2018-08-05 HISTORY — PX: PORT A CATH REVISION: SHX6033

## 2018-08-05 SURGERY — REVISION, PORT-A-CATH INSERTION
Anesthesia: General | Laterality: Left

## 2018-08-05 MED ORDER — ONDANSETRON HCL 4 MG/2ML IJ SOLN
INTRAMUSCULAR | Status: DC | PRN
  Administered 2018-08-05: 4 mg via INTRAVENOUS

## 2018-08-05 MED ORDER — FAMOTIDINE 20 MG PO TABS
ORAL_TABLET | ORAL | Status: AC
  Filled 2018-08-05: qty 1

## 2018-08-05 MED ORDER — LIDOCAINE HCL (PF) 2 % IJ SOLN
INTRAMUSCULAR | Status: AC
  Filled 2018-08-05: qty 10

## 2018-08-05 MED ORDER — ONDANSETRON HCL 4 MG/2ML IJ SOLN
INTRAMUSCULAR | Status: AC
  Filled 2018-08-05: qty 2

## 2018-08-05 MED ORDER — FENTANYL CITRATE (PF) 100 MCG/2ML IJ SOLN
INTRAMUSCULAR | Status: DC | PRN
  Administered 2018-08-05: 25 ug via INTRAVENOUS

## 2018-08-05 MED ORDER — FENTANYL CITRATE (PF) 100 MCG/2ML IJ SOLN
INTRAMUSCULAR | Status: AC
  Filled 2018-08-05: qty 2

## 2018-08-05 MED ORDER — PROPOFOL 500 MG/50ML IV EMUL
INTRAVENOUS | Status: DC | PRN
  Administered 2018-08-05: 100 ug/kg/min via INTRAVENOUS

## 2018-08-05 MED ORDER — FAMOTIDINE 20 MG PO TABS
20.0000 mg | ORAL_TABLET | Freq: Once | ORAL | Status: AC
  Administered 2018-08-05: 20 mg via ORAL

## 2018-08-05 MED ORDER — LIDOCAINE HCL 1 % IJ SOLN
INTRAMUSCULAR | Status: DC | PRN
  Administered 2018-08-05: 10 mL

## 2018-08-05 MED ORDER — MIDAZOLAM HCL 2 MG/2ML IJ SOLN
INTRAMUSCULAR | Status: DC | PRN
  Administered 2018-08-05: 2 mg via INTRAVENOUS

## 2018-08-05 MED ORDER — PROPOFOL 10 MG/ML IV BOLUS
INTRAVENOUS | Status: DC | PRN
  Administered 2018-08-05: 50 mg via INTRAVENOUS

## 2018-08-05 MED ORDER — ONDANSETRON HCL 4 MG/2ML IJ SOLN: 4.0000 mg | Freq: Once | INTRAMUSCULAR | Status: DC | PRN

## 2018-08-05 MED ORDER — CEFAZOLIN SODIUM-DEXTROSE 2-4 GM/100ML-% IV SOLN
INTRAVENOUS | Status: AC
  Filled 2018-08-05: qty 100

## 2018-08-05 MED ORDER — MIDAZOLAM HCL 2 MG/2ML IJ SOLN
INTRAMUSCULAR | Status: AC
  Filled 2018-08-05: qty 2

## 2018-08-05 MED ORDER — PROPOFOL 500 MG/50ML IV EMUL
INTRAVENOUS | Status: AC
  Filled 2018-08-05: qty 50

## 2018-08-05 MED ORDER — LACTATED RINGERS IV SOLN
INTRAVENOUS | Status: DC
  Administered 2018-08-05: 09:00:00 via INTRAVENOUS

## 2018-08-05 MED ORDER — FENTANYL CITRATE (PF) 100 MCG/2ML IJ SOLN: 25.0000 ug | INTRAMUSCULAR | Status: DC | PRN

## 2018-08-05 MED ORDER — SODIUM CHLORIDE (PF) 0.9 % IJ SOLN
INTRAMUSCULAR | Status: AC
  Filled 2018-08-05: qty 50

## 2018-08-05 MED ORDER — LIDOCAINE HCL (PF) 1 % IJ SOLN
INTRAMUSCULAR | Status: AC
  Filled 2018-08-05: qty 30

## 2018-08-05 MED ORDER — LIDOCAINE HCL (CARDIAC) PF 100 MG/5ML IV SOSY
PREFILLED_SYRINGE | INTRAVENOUS | Status: DC | PRN
  Administered 2018-08-05: 60 mg via INTRAVENOUS

## 2018-08-05 MED ORDER — KETOROLAC TROMETHAMINE 30 MG/ML IJ SOLN: INTRAMUSCULAR | Status: DC | PRN

## 2018-08-05 SURGICAL SUPPLY — 32 items
BLADE SURG 15 STRL SS SAFETY (BLADE) ×3 IMPLANT
CHLORAPREP W/TINT 26 (MISCELLANEOUS) ×3 IMPLANT
CLOSURE WOUND 1/2 X4 (GAUZE/BANDAGES/DRESSINGS) ×1
COVER LIGHT HANDLE STERIS (MISCELLANEOUS) ×6 IMPLANT
COVER WAND RF STERILE (DRAPES) ×3 IMPLANT
DECANTER SPIKE VIAL GLASS SM (MISCELLANEOUS) ×6 IMPLANT
DRAPE C-ARM XRAY 36X54 (DRAPES) ×3 IMPLANT
DRAPE LAPAROTOMY TRNSV 106X77 (MISCELLANEOUS) ×3 IMPLANT
DRSG TEGADERM 2-3/8X2-3/4 SM (GAUZE/BANDAGES/DRESSINGS) ×3 IMPLANT
DRSG TEGADERM 4X4.75 (GAUZE/BANDAGES/DRESSINGS) ×3 IMPLANT
DRSG TELFA 4X3 1S NADH ST (GAUZE/BANDAGES/DRESSINGS) ×3 IMPLANT
ELECT REM PT RETURN 9FT ADLT (ELECTROSURGICAL) ×3
ELECTRODE REM PT RTRN 9FT ADLT (ELECTROSURGICAL) ×1 IMPLANT
GLOVE BIO SURGEON STRL SZ7.5 (GLOVE) ×3 IMPLANT
GLOVE INDICATOR 8.0 STRL GRN (GLOVE) ×3 IMPLANT
GOWN STRL REUS W/ TWL LRG LVL3 (GOWN DISPOSABLE) ×2 IMPLANT
GOWN STRL REUS W/TWL LRG LVL3 (GOWN DISPOSABLE) ×4
KIT PORT POWER 8FR ISP CVUE (Port) ×3 IMPLANT
KIT TURNOVER KIT A (KITS) ×3 IMPLANT
LABEL OR SOLS (LABEL) ×3 IMPLANT
MICROPUNCTURE 5FR NT-U-SST (MISCELLANEOUS) ×3
NS IRRIG 500ML POUR BTL (IV SOLUTION) ×3 IMPLANT
PACK PORT-A-CATH (MISCELLANEOUS) ×3 IMPLANT
SET MICROPUNCTURE 5FR NT-U-SST (MISCELLANEOUS) ×1 IMPLANT
STRIP CLOSURE SKIN 1/2X4 (GAUZE/BANDAGES/DRESSINGS) ×2 IMPLANT
SUT PROLENE 3 0 SH DA (SUTURE) ×3 IMPLANT
SUT VIC AB 3-0 SH 27 (SUTURE) ×2
SUT VIC AB 3-0 SH 27X BRD (SUTURE) ×1 IMPLANT
SUT VIC AB 4-0 FS2 27 (SUTURE) ×3 IMPLANT
SWABSTK COMLB BENZOIN TINCTURE (MISCELLANEOUS) ×3 IMPLANT
SYR 10ML LL (SYRINGE) ×3 IMPLANT
SYR 10ML SLIP (SYRINGE) ×3 IMPLANT

## 2018-08-05 NOTE — Anesthesia Postprocedure Evaluation (Signed)
Anesthesia Post Note  Patient: [name] [name]  Procedure(s) Performed: PORT A CATH REVISION, REPOSITION LEFT (Left )  Patient location during evaluation: PACU Anesthesia Type: General Level of consciousness: awake and alert Pain management: pain level controlled Vital Signs Assessment: post-procedure vital signs reviewed and stable Respiratory status: spontaneous breathing, nonlabored ventilation, respiratory function stable and patient connected to nasal cannula oxygen Cardiovascular status: blood pressure returned to baseline and stable Postop Assessment: no apparent nausea or vomiting Anesthetic complications: no     Last Vitals:  Vitals:   08/05/18 1215 08/05/18 1220  BP:  (!) 168/85  Pulse: 64 65  Resp: 14 16  Temp:  (!) 36.2 C  SpO2: 98% 97%    Last Pain:  Vitals:   08/05/18 1220  TempSrc: Temporal  PainSc: 0-No pain                 Molli Barrows

## 2018-08-05 NOTE — H&P (Signed)
Port malfunction post placement last week. For repositioning. Oterwise, no change in history or physical exam.

## 2018-08-05 NOTE — Transfer of Care (Signed)
Immediate Anesthesia Transfer of Care Note  Patient: Fawaz Borquez Kibler  Procedure(s) Performed: PORT A CATH REVISION, REPOSITION LEFT (Left )  Patient Location: PACU  Anesthesia Type:General  Level of Consciousness: sedated  Airway & Oxygen Therapy: Patient Spontanous Breathing and Patient connected to face mask oxygen  Post-op Assessment: Report given to RN and Post -op Vital signs reviewed and stable  Post vital signs: Reviewed and stable  Last Vitals:  Vitals Value Taken Time  BP 111/76 08/05/18 1126  Temp 36.4 C 08/05/18 1126  Pulse 69 08/05/18 1126  Resp 12 08/05/18 1126  SpO2 98 % 08/05/18 1126  Vitals shown include unvalidated device data.  Last Pain:  Vitals:   08/05/18 0830  TempSrc: Oral  PainSc: 0-No pain         Complications: No apparent anesthesia complications

## 2018-08-05 NOTE — Anesthesia Preprocedure Evaluation (Signed)
Anesthesia Evaluation  Patient identified by MRN, date of birth, ID band Patient awake    Reviewed: Allergy & Precautions, NPO status , Patient's Chart, lab work & pertinent test results  History of Anesthesia Complications Negative for: history of anesthetic complications  Airway Mallampati: II       Dental   Pulmonary neg sleep apnea, neg COPD,  Pulmonary mass          Cardiovascular (-) hypertension(-) Past MI and (-) CHF (-) dysrhythmias (-) Valvular Problems/Murmurs     Neuro/Psych neg Seizures    GI/Hepatic Neg liver ROS, neg GERD  ,  Endo/Other  neg diabetes  Renal/GU negative Renal ROS     Musculoskeletal   Abdominal   Peds  Hematology  (+) anemia ,   Anesthesia Other Findings   Reproductive/Obstetrics                             Anesthesia Physical Anesthesia Plan  ASA: III  Anesthesia Plan: General   Post-op Pain Management:    Induction:   PONV Risk Score and Plan: 2 and Propofol infusion and TIVA  Airway Management Planned: Nasal Cannula  Additional Equipment:   Intra-op Plan:   Post-operative Plan:   Informed Consent: I have reviewed the patients History and Physical, chart, labs and discussed the procedure including the risks, benefits and alternatives for the proposed anesthesia with the patient or authorized representative who has indicated his/her understanding and acceptance.       Plan Discussed with:   Anesthesia Plan Comments:         Anesthesia Quick Evaluation

## 2018-08-05 NOTE — Progress Notes (Signed)
Dr. Bary Castilla stated he is okay with patient going home without him being again in post op. Talked to the wife about d/c instructions.

## 2018-08-05 NOTE — Op Note (Signed)
Preoperative diagnosis: Malfunctioning PowerPort.  Postoperative diagnosis: Same.  Operative procedure: Replacement of left subclavian PowerPort with fluoroscopic guidance.  Operating Surgeon: Hervey Ard, MD.  Anesthesia: Attended local, 10 cc 1% plain Xylocaine.  Estimated blood loss: None.  Clinical note: This 63 year old male had a PowerPort placed last week.  When it was first utilized there was appreciated to be a kink with inhibited flow.  He returned to the operating this time for repositioning/replacement.  He received Kefzol prior to the procedure.  Operative note: The patient received Ancef prior to the procedure.  The left chest and neck was prepped with ChloraPrep and draped.  The old incisions were opened.  Attempts to reposition the catheter were unsuccessful.  It was cannulated with a guidewire from a new catheter set and the guidewire advanced into the IVC.  The old catheter was removed and a new catheter placed positioned at the junction of the right atrium subclavian based on fluoroscopy.  This was now re-tunneled back to the original port site.  The new port was attached after flushing.  It easily irrigated and aspirated.  The port was anchored to the deep tissue with interrupted 3-0 Prolene sutures.  The wounds were closed with 3-0 Vicryl running suture to the deep layer and a running 4-0 Vicryl subcuticular suture to the skin.  Benzoin and Steri-Strip followed by Telfa and Tegaderm dressings were applied.  The patient tolerated the procedure well and was taken to the recovery room in stable condition.  As the vein was not recannulated a new chest x-ray was not obtained in the recovery area.

## 2018-08-05 NOTE — Discharge Instructions (Signed)

## 2018-08-05 NOTE — Anesthesia Post-op Follow-up Note (Signed)
Anesthesia QCDR form completed.        

## 2018-08-06 ENCOUNTER — Other Ambulatory Visit: Payer: Self-pay

## 2018-08-07 ENCOUNTER — Encounter: Payer: Self-pay | Admitting: Oncology

## 2018-08-07 ENCOUNTER — Inpatient Hospital Stay (HOSPITAL_BASED_OUTPATIENT_CLINIC_OR_DEPARTMENT_OTHER): Payer: 59 | Admitting: Oncology

## 2018-08-07 ENCOUNTER — Other Ambulatory Visit: Payer: Self-pay

## 2018-08-07 ENCOUNTER — Inpatient Hospital Stay: Payer: 59

## 2018-08-07 ENCOUNTER — Inpatient Hospital Stay: Payer: 59 | Admitting: *Deleted

## 2018-08-07 ENCOUNTER — Inpatient Hospital Stay (HOSPITAL_BASED_OUTPATIENT_CLINIC_OR_DEPARTMENT_OTHER): Payer: 59 | Admitting: Hospice and Palliative Medicine

## 2018-08-07 VITALS — BP 158/97 | HR 86 | Temp 97.4°F | Ht 75.0 in | Wt 268.0 lb

## 2018-08-07 DIAGNOSIS — C20 Malignant neoplasm of rectum: Secondary | ICD-10-CM

## 2018-08-07 DIAGNOSIS — D72819 Decreased white blood cell count, unspecified: Secondary | ICD-10-CM

## 2018-08-07 DIAGNOSIS — R059 Cough, unspecified: Secondary | ICD-10-CM

## 2018-08-07 DIAGNOSIS — Z5112 Encounter for antineoplastic immunotherapy: Secondary | ICD-10-CM | POA: Diagnosis not present

## 2018-08-07 DIAGNOSIS — Z95828 Presence of other vascular implants and grafts: Secondary | ICD-10-CM

## 2018-08-07 DIAGNOSIS — Z5111 Encounter for antineoplastic chemotherapy: Secondary | ICD-10-CM | POA: Diagnosis not present

## 2018-08-07 DIAGNOSIS — R05 Cough: Secondary | ICD-10-CM

## 2018-08-07 LAB — COMPREHENSIVE METABOLIC PANEL
ALT: 13 U/L (ref 0–44)
AST: 15 U/L (ref 15–41)
Albumin: 3.9 g/dL (ref 3.5–5.0)
Alkaline Phosphatase: 56 U/L (ref 38–126)
Anion gap: 6 (ref 5–15)
BUN: 14 mg/dL (ref 8–23)
CO2: 26 mmol/L (ref 22–32)
Calcium: 8.7 mg/dL — ABNORMAL LOW (ref 8.9–10.3)
Chloride: 108 mmol/L (ref 98–111)
Creatinine, Ser: 0.88 mg/dL (ref 0.61–1.24)
GFR calc Af Amer: >60 mL/min (ref >60)
GFR calc non Af Amer: >60 mL/min (ref >60)
Glucose, Bld: 103 mg/dL — ABNORMAL HIGH (ref 70–99)
Potassium: 4 mmol/L (ref 3.5–5.1)
Sodium: 140 mmol/L (ref 135–145)
Total Bilirubin: 0.7 mg/dL (ref 0.3–1.2)
Total Protein: 6.7 g/dL (ref 6.5–8.1)

## 2018-08-07 LAB — CBC WITH DIFFERENTIAL/PLATELET
Abs Immature Granulocytes: 0.03 10*3/uL (ref 0.00–0.07)
Basophils Absolute: 0.1 10*3/uL (ref 0.0–0.1)
Basophils Relative: 2 %
Eosinophils Absolute: 0.2 10*3/uL (ref 0.0–0.5)
Eosinophils Relative: 5 %
HCT: 42.8 % (ref 39.0–52.0)
Hemoglobin: 14.2 g/dL (ref 13.0–17.0)
Immature Granulocytes: 1 %
Lymphocytes Relative: 20 %
Lymphs Abs: 0.7 10*3/uL (ref 0.7–4.0)
MCH: 28.6 pg (ref 26.0–34.0)
MCHC: 33.2 g/dL (ref 30.0–36.0)
MCV: 86.1 fL (ref 80.0–100.0)
Monocytes Absolute: 0.4 10*3/uL (ref 0.1–1.0)
Monocytes Relative: 11 %
Neutro Abs: 2.2 10*3/uL (ref 1.7–7.7)
Neutrophils Relative %: 61 %
Platelets: 268 10*3/uL (ref 150–400)
RBC: 4.97 MIL/uL (ref 4.22–5.81)
RDW: 16 % — ABNORMAL HIGH (ref 11.5–15.5)
WBC: 3.5 10*3/uL — ABNORMAL LOW (ref 4.0–10.5)
nRBC: 0 % (ref 0.0–0.2)

## 2018-08-07 MED ORDER — FLUOROURACIL CHEMO INJECTION 2.5 GM/50ML
400.0000 mg/m2 | Freq: Once | INTRAVENOUS | Status: AC
  Administered 2018-08-07: 1000 mg via INTRAVENOUS
  Filled 2018-08-07: qty 20

## 2018-08-07 MED ORDER — LEUCOVORIN CALCIUM INJECTION 350 MG
1000.0000 mg | Freq: Once | INTRAVENOUS | Status: AC
  Administered 2018-08-07: 12:00:00 1000 mg via INTRAVENOUS
  Filled 2018-08-07: qty 50

## 2018-08-07 MED ORDER — SODIUM CHLORIDE 0.9 % IV SOLN
2400.0000 mg/m2 | INTRAVENOUS | Status: DC
  Administered 2018-08-07: 6150 mg via INTRAVENOUS
  Filled 2018-08-07: qty 100

## 2018-08-07 MED ORDER — OXALIPLATIN CHEMO INJECTION 100 MG/20ML
85.0000 mg/m2 | Freq: Once | INTRAVENOUS | Status: AC
  Administered 2018-08-07: 220 mg via INTRAVENOUS
  Filled 2018-08-07: qty 40

## 2018-08-07 MED ORDER — DEXAMETHASONE SODIUM PHOSPHATE 10 MG/ML IJ SOLN
10.0000 mg | Freq: Once | INTRAMUSCULAR | Status: AC
  Administered 2018-08-07: 10 mg via INTRAVENOUS
  Filled 2018-08-07: qty 1

## 2018-08-07 MED ORDER — PALONOSETRON HCL INJECTION 0.25 MG/5ML
0.2500 mg | Freq: Once | INTRAVENOUS | Status: AC
  Administered 2018-08-07: 11:00:00 0.25 mg via INTRAVENOUS
  Filled 2018-08-07: qty 5

## 2018-08-07 MED ORDER — SODIUM CHLORIDE 0.9% FLUSH
10.0000 mL | Freq: Once | INTRAVENOUS | Status: AC
  Administered 2018-08-07: 10:00:00 10 mL via INTRAVENOUS
  Filled 2018-08-07: qty 10

## 2018-08-07 MED ORDER — DEXTROSE 5 % IV SOLN
Freq: Once | INTRAVENOUS | Status: AC
  Administered 2018-08-07: 11:00:00 via INTRAVENOUS
  Filled 2018-08-07: qty 250

## 2018-08-07 NOTE — Progress Notes (Signed)
Patient stated that he is ready to start his chemo therapy. Patient would also want a refill on his Tussinex.

## 2018-08-07 NOTE — Progress Notes (Signed)
[place]  [phone] [phone]  Patient Care Team: Hubbard Hartshorn, FNP as PCP - General (Family Medicine) Marlyce Huge, MD as Attending Physician (Surgery) Lloyd Huger, MD as Consulting Physician (Oncology) Abbie Sons, MD (Urology) Eulogio Bear, MD as Consulting Physician (Ophthalmology) Flora Lipps, MD as Consulting Physician (Pulmonary Disease)   Name of the patient: [name] [name]  [id number]  [birth year]   Date of Visit: 08/07/18  Diagnosis: Recurrent stage IV rectal adenocarcinoma  Current Treatment: FOLFOX / Bevacizumab  Reason for Visit: This patient is a 63 y.o. male who presents to chemo care clinic today for initial meeting in preparation for starting chemotherapy. I introduced the chemo care clinic and we discussed that the role of the clinic is to assist those who are at an increased risk of emergency room visits and/or complications during the course of chemotherapy treatment. We discussed that the increased risk takes into account factors such as age, performance status, and co-morbidities. We also discussed that for some, this might include barriers to care such as not having a primary care provider, lack of insurance/transportation, or not being able to afford medications. We discussed that the goal of the program is to help prevent unplanned ER visits and help reduce complications during chemotherapy. We do this by discussing specific risk factors to each individual and identifying ways that we can help improve these risk factors and reduce barriers to care.   Hematology/Oncology History:  Oncology History  Rectal cancer ([place])  08/05/2014 Initial Diagnosis   Rectal cancer ([place])   08/07/2018 -  Chemotherapy   The patient had palonosetron (ALOXI) injection 0.25 mg, 0.25 mg, Intravenous,  Once, 0 of 12 cycles bevacizumab (AVASTIN) 625 mg in sodium chloride 0.9 % 100  mL chemo infusion, 5 mg/kg = 625 mg, Intravenous,  Once, 0 of 12 cycles leucovorin 1,024 mg in dextrose 5 % 250 mL infusion, 400 mg/m2 = 1,024 mg, Intravenous,  Once, 0 of 12 cycles oxaliplatin (ELOXATIN) 220 mg in dextrose 5 % 500 mL chemo infusion, 85 mg/m2 = 220 mg, Intravenous,  Once, 0 of 12 cycles fluorouracil (ADRUCIL) chemo injection 1,000 mg, 400 mg/m2 = 1,000 mg, Intravenous,  Once, 0 of 12 cycles fluorouracil (ADRUCIL) 6,150 mg in sodium chloride 0.9 % 127 mL chemo infusion, 2,400 mg/m2 = 6,150 mg, Intravenous, 1 Day/Dose, 0 of 12 cycles  for chemotherapy treatment.       Allergies  Allergen Reactions  . Sulfa Antibiotics Hives    Other reaction(s): Other (qualifier value)     Past Medical History:  Diagnosis Date  . Anemia   . Cancer [facility])    rectal 2015, followed by Oncology, permanent colostomy  . Colostomy in place [facility])   . History of kidney stones   . Seasonal allergies      Past Surgical History:  Procedure Laterality Date  . BACK SURGERY    . CHOLECYSTECTOMY    . COLON SURGERY    . ENDOBRONCHIAL ULTRASOUND N/A 07/01/2018   Procedure: ENDOBRONCHIAL ULTRASOUND;  Surgeon: Tyler Pita, MD;  Location: [facility] ORS;  Service: Cardiopulmonary;  Laterality: N/A;  . KNEE SURGERY Bilateral   . PORT A CATH REVISION Left 08/05/2018   Procedure: PORT A CATH REVISION, REPOSITION LEFT;  Surgeon: Robert Bellow, MD;  Location: [facility] ORS;  Service: General;  Laterality: Left;  . PORT-A-CATH REMOVAL Right 12/02/2014   Procedure: REMOVAL PORT-A-CATH;  Surgeon: Marlyce Huge, MD;  Location:  [facility] ORS;  Service: General;  Laterality: Right;  . PORTACATH PLACEMENT    . PORTACATH PLACEMENT Left 07/29/2018   Procedure: INSERTION PORT-A-CATH LEFT;  Surgeon: Robert Bellow, MD;  Location: [facility] ORS;  Service: General;  Laterality: Left;    Social History   Socioeconomic History  . Marital status: Married    Spouse name: Olegario Shearer  . Number of children: Not on file  .  Years of education: Not on file  . Highest education level: Not on file  Occupational History  . Not on file  Social Needs  . Financial resource strain: Not hard at all  . Food insecurity    Worry: Never true    Inability: Never true  . Transportation needs    Medical: No    Non-medical: No  Tobacco Use  . Smoking status: Never Smoker  . Smokeless tobacco: Current User    Types: Snuff  . Tobacco comment: might smoke a cigar once every 3 months  Substance and Sexual Activity  . Alcohol use: Yes    Alcohol/week: 0.0 standard drinks    Comment: RARE  . Drug use: No  . Sexual activity: Yes    Partners: Female  Lifestyle  . Physical activity    Days per week: 5 days    Minutes per session: 60 min  . Stress: Not at all  Relationships  . Social connections    Talks on phone: More than three times a week    Gets together: More than three times a week    Attends religious service: More than 4 times per year    Active member of club or organization: Not on file    Attends meetings of clubs or organizations: 1 to 4 times per year    Relationship status: Married  . Intimate partner violence    Fear of current or ex partner: No    Emotionally abused: No    Physically abused: No    Forced sexual activity: No  Other Topics Concern  . Not on file  Social History Narrative  . Not on file    Family History  Problem Relation Age of Onset  . Cancer Mother   . Hypertension Mother   . COPD Mother   . COPD Father   . Cancer Father        Prostate CA, on Lupron  . Stroke Maternal Grandmother   . Cancer Paternal Grandmother   . Heart attack Paternal Grandfather   . Cancer Paternal Grandfather     Current Outpatient Medications  Medication Sig Dispense Refill  . albuterol (VENTOLIN HFA) 108 (90 Base) MCG/ACT inhaler Inhale 2 puffs into the lungs every 6 (six) hours as needed for wheezing or shortness of breath (cough). 1 Inhaler 2  . cetirizine (ZYRTEC) 10 MG tablet Take 10 mg  by mouth at bedtime.     . chlorpheniramine-HYDROcodone (TUSSIONEX PENNKINETIC ER) 10-8 MG/5ML SUER Take 5 mLs by mouth every 12 (twelve) hours as needed for cough. 200 mL 0  . Dextromethorphan-guaiFENesin (MUCINEX DM MAXIMUM STRENGTH) 60-1200 MG TB12 Take 1 tablet by mouth at bedtime as needed (congestion/drainage.).    [name] gabapentin (NEURONTIN) 100 MG capsule Take 1 capsule once daily before bed x2 days, then take 2 capsules once daily before bed x2 days, then may increase to 3 capsules once daily before bed if needed. (Patient taking differently: Take 200 mg by mouth at bedtime. ) 90 capsule 1  . guaiFENesin (MUCINEX) 600 MG 12 hr tablet  Take 1 tablet (600 mg total) by mouth 2 (two) times daily. (Patient not taking: Reported on 08/02/2018) 20 tablet 0  . lidocaine-prilocaine (EMLA) cream Apply to affected area once (Patient taking differently: Apply 1 application topically daily as needed (prior to port being accessed.). ) 30 g 3  . loperamide (IMODIUM A-D) 2 MG capsule Take 2 mg by mouth as needed for diarrhea or loose stools.     . Multiple Vitamin (MULTIVITAMIN WITH MINERALS) TABS tablet Take 1 tablet by mouth at bedtime.    [name] NEEDLE, DISP, 18 G 18G X 1" MISC Use as directed 50 each 0  . ondansetron (ZOFRAN) 8 MG tablet Take 1 tablet (8 mg total) by mouth 2 (two) times daily as needed for refractory nausea / vomiting. 30 tablet 2  . Oxymetazoline HCl (AFRIN EXTRA MOISTURIZING NA) Place 1 spray into the nose 2 (two) times daily as needed (congestion.).    [name] prochlorperazine (COMPAZINE) 10 MG tablet Take 1 tablet (10 mg total) by mouth every 6 (six) hours as needed (Nausea or vomiting). 60 tablet 2  . SYRINGE-NEEDLE, DISP, 3 ML (LUER LOCK SAFETY SYRINGES) 21G X 1-1/2" 3 ML MISC Use as directed for testosterone administration 50 each 0  . tadalafil (ADCIRCA/CIALIS) 20 MG tablet Take 1 tablet (20 mg total) by mouth daily as needed for erectile dysfunction. 6 tablet 10  . testosterone cypionate  (DEPOTESTOSTERONE CYPIONATE) 200 MG/ML injection Inject 1 mL (200 mg total) into the muscle every 14 (fourteen) days. 10 mL 0  . timolol (BETIMOL) 0.5 % ophthalmic solution Place 1 drop into the right eye at bedtime.      No current facility-administered medications for this visit.      PERFORMANCE STATUS (ECOG) : 0 - Asymptomatic  Review of Systems As noted above. Otherwise, a complete review of systems is negative.  Physical Exam General: NAD, frail appearing, thin Pulmonary: Unlabored Extremities: no edema Skin: no rashes Neurological: Weakness but otherwise nonfocal   Assessment and Plan:    1. Cancer: Rectal cancer on active treatment with FOLFOX / Bevacizumab.  Followed by Dr. Grayland Ormond.  2. High Risk for ER/Hospitalization during Chemotherapy: We discussed the role of the chemo care clinic and identified patient specific risk factors. We also discussed the role of the Symptom Management and Palliative Care Clinics at [facility] and methods of contacting clinic/provider. He denies needing specific assistance at this time.  Current PCP: Hubbard Hartshorn, [facility] Admissions: 3   ED Visits: 0  Has Medicaid: No  Has Medicare: No  In relationship: Yes  Has Anemia: No  Has asthma: No  Has atrial fibrillation: No  Has CVD: No  Has chronic kidney disease: No  Has Chronic Obstructive Pulmonary Disease: No  Has Congestive Heart Failure: No  Has Connective Tissue Disorder: No  Has Depression: No  Has Diabetes: No  Has liver disease: No  Has Peripheral Vascular Disease: No     3. 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  He did not feel the need to explore resources today.  We did  discuss future use of [name] or palliative care if needed.  His wife is a Equities trader and he feels like he has sufficient support in the  home.  He has no issues currently with medications in the home.  He is regularly followed by his PCP.    Patient expressed understanding and was in agreement with this plan. He also understands that He can call clinic at any time with any questions, concerns, or complaints.   A total of (15) minutes of face-to-face time was spent with this patient with greater than 50% of that time in counseling and care-coordination.   Signed by: Altha Harm, PhD, DNP, NP-C, [facility] [phone] (Work Cell)

## 2018-08-07 NOTE — Progress Notes (Signed)
Hold avastin today per md

## 2018-08-08 LAB — MISC LABCORP TEST (SEND OUT): LabCorp test name: 183805

## 2018-08-08 LAB — CEA: CEA: 8.6 ng/mL — ABNORMAL HIGH (ref 0.0–4.7)

## 2018-08-08 LAB — ASPERGILLUS ANTIGEN, BAL/SERUM: Aspergillus Ag, BAL/Serum: 0.03 Index (ref 0.00–0.49)

## 2018-08-08 MED ORDER — HYDROCOD POLST-CPM POLST ER 10-8 MG/5ML PO SUER: 5.0000 mL | Freq: Two times a day (BID) | ORAL | 0 refills | Status: DC | PRN

## 2018-08-08 NOTE — Progress Notes (Signed)
[place]  [phone] [phone]) [phone]  ID: [name] [name] OB: 1955/02/04  MR#: [id number]  YCX#:[id number]  Patient Care Team: Hubbard Hartshorn, FNP as PCP - General (Family Medicine) Marlyce Huge, MD as Attending Physician (Surgery) Lloyd Huger, MD as Consulting Physician (Oncology) Abbie Sons, MD (Urology) Eulogio Bear, MD as Consulting Physician (Ophthalmology) Flora Lipps, MD as Consulting Physician (Pulmonary Disease)   CHIEF COMPLAINT: Recurrent, stage IV rectal adenocarcinoma.  INTERVAL HISTORY: Patient returns to clinic today for further evaluation and reconsideration of cycle 1 of FOLFOX plus Avastin.  His port has been replaced and is now functioning well.  He complains of cough, but otherwise feels well and is asymptomatic. He has no issues with his colostomy.  He has no neurologic complaints.  He denies any recent fevers or illnesses.  He denies any chest pain, shortness of breath, or hemoptysis.  He denies nausea, vomiting, constipation, or diarrhea. He has noted no blood or melena in his colostomy bag.  He has no urinary complaints.  Patient offers no further specific complaints today.  REVIEW OF SYSTEMS:   Review of Systems  Constitutional: Negative.  Negative for fever, malaise/fatigue and weight loss.  Respiratory: Positive for cough. Negative for shortness of breath.   Cardiovascular: Negative.  Negative for chest pain and leg swelling.  Gastrointestinal: Negative.  Negative for abdominal pain, blood in stool, constipation, diarrhea and melena.  Genitourinary: Negative.  Negative for dysuria.  Musculoskeletal: Negative.  Negative for back pain.  Skin: Negative.  Negative for rash.  Neurological: Negative.  Negative for focal weakness, weakness and headaches.  Psychiatric/Behavioral: Negative.  The patient is not nervous/anxious.     As per HPI. Otherwise, a complete review of systems is  negative.  PAST MEDICAL HISTORY: Past Medical History:  Diagnosis Date   Anemia    Cancer ([place])    rectal 2015, followed by Oncology, permanent colostomy   Colostomy in place [facility])    History of kidney stones    Seasonal allergies     PAST SURGICAL HISTORY: Past Surgical History:  Procedure Laterality Date   BACK SURGERY     CHOLECYSTECTOMY     COLON SURGERY     ENDOBRONCHIAL ULTRASOUND N/A 07/01/2018   Procedure: ENDOBRONCHIAL ULTRASOUND;  Surgeon: Tyler Pita, MD;  Location: [facility] ORS;  Service: Cardiopulmonary;  Laterality: N/A;   KNEE SURGERY Bilateral    PORT A CATH REVISION Left 08/05/2018   Procedure: PORT A CATH REVISION, REPOSITION LEFT;  Surgeon: Robert Bellow, MD;  Location: [facility] ORS;  Service: General;  Laterality: Left;   PORT-A-CATH REMOVAL Right 12/02/2014   Procedure: REMOVAL PORT-A-CATH;  Surgeon: Marlyce Huge, MD;  Location: [facility] ORS;  Service: General;  Laterality: Right;   PORTACATH PLACEMENT     PORTACATH PLACEMENT Left 07/29/2018   Procedure: INSERTION PORT-A-CATH LEFT;  Surgeon: Robert Bellow, MD;  Location: [facility] ORS;  Service: General;  Laterality: Left;    FAMILY HISTORY: Father with prostate cancer.  COPD, CHF.     ADVANCED DIRECTIVES:    HEALTH MAINTENANCE: Social History   Tobacco Use   Smoking status: Never Smoker   Smokeless tobacco: Current User    Types: Snuff   Tobacco comment: might smoke a cigar once every 3 months  Substance Use Topics   Alcohol use: Yes    Alcohol/week: 0.0 standard drinks    Comment: RARE   Drug use: No     Colonoscopy:  PAP:  Bone density:  Lipid panel:  Allergies  Allergen Reactions   Sulfa Antibiotics Hives    Other reaction(s): Other (qualifier value)    Current Outpatient Medications  Medication Sig Dispense Refill   albuterol (VENTOLIN HFA) 108 (90 Base) MCG/ACT inhaler Inhale 2 puffs into the lungs every 6 (six) hours as needed for wheezing or  shortness of breath (cough). 1 Inhaler 2   cetirizine (ZYRTEC) 10 MG tablet Take 10 mg by mouth at bedtime.      chlorpheniramine-HYDROcodone (TUSSIONEX PENNKINETIC ER) 10-8 MG/5ML SUER Take 5 mLs by mouth every 12 (twelve) hours as needed for cough. 200 mL 0   Dextromethorphan-guaiFENesin (MUCINEX DM MAXIMUM STRENGTH) 60-1200 MG TB12 Take 1 tablet by mouth at bedtime as needed (congestion/drainage.).     gabapentin (NEURONTIN) 100 MG capsule Take 1 capsule once daily before bed x2 days, then take 2 capsules once daily before bed x2 days, then may increase to 3 capsules once daily before bed if needed. (Patient taking differently: Take 200 mg by mouth at bedtime. ) 90 capsule 1   guaiFENesin (MUCINEX) 600 MG 12 hr tablet Take 1 tablet (600 mg total) by mouth 2 (two) times daily. 20 tablet 0   lidocaine-prilocaine (EMLA) cream Apply to affected area once (Patient taking differently: Apply 1 application topically daily as needed (prior to port being accessed.). ) 30 g 3   loperamide (IMODIUM A-D) 2 MG capsule Take 2 mg by mouth as needed for diarrhea or loose stools.      Multiple Vitamin (MULTIVITAMIN WITH MINERALS) TABS tablet Take 1 tablet by mouth at bedtime.     NEEDLE, DISP, 18 G 18G X 1" MISC Use as directed 50 each 0   ondansetron (ZOFRAN) 8 MG tablet Take 1 tablet (8 mg total) by mouth 2 (two) times daily as needed for refractory nausea / vomiting. 30 tablet 2   Oxymetazoline HCl (AFRIN EXTRA MOISTURIZING NA) Place 1 spray into the nose 2 (two) times daily as needed (congestion.).     prochlorperazine (COMPAZINE) 10 MG tablet Take 1 tablet (10 mg total) by mouth every 6 (six) hours as needed (Nausea or vomiting). 60 tablet 2   SYRINGE-NEEDLE, DISP, 3 ML (LUER LOCK SAFETY SYRINGES) 21G X 1-1/2" 3 ML MISC Use as directed for testosterone administration 50 each 0   tadalafil (ADCIRCA/CIALIS) 20 MG tablet Take 1 tablet (20 mg total) by mouth daily as needed for erectile dysfunction. 6  tablet 10   testosterone cypionate (DEPOTESTOSTERONE CYPIONATE) 200 MG/ML injection Inject 1 mL (200 mg total) into the muscle every 14 (fourteen) days. 10 mL 0   timolol (BETIMOL) 0.5 % ophthalmic solution Place 1 drop into the right eye at bedtime.      No current facility-administered medications for this visit.     OBJECTIVE: Vitals:   08/07/18 1009  BP: (!) 158/97  Pulse: 86  Temp: (!) 97.4 F (36.3 C)     Body mass index is 33.5 kg/m.    ECOG FS:0 - Asymptomatic  General: Well-developed, well-nourished, no acute distress. Eyes: Pink conjunctiva, anicteric sclera. HEENT: Normocephalic, moist mucous membranes, clear oropharnyx. Lungs: Clear to auscultation bilaterally. Heart: Regular rate and rhythm. No rubs, murmurs, or gallops. Abdomen: Soft, nontender, nondistended. No organomegaly noted, normoactive bowel sounds.  Colostomy bag noted. Musculoskeletal: No edema, cyanosis, or clubbing. Neuro: Alert, answering all questions appropriately. Cranial nerves grossly intact. Skin: No rashes or petechiae noted. Psych: Normal affect.  LAB RESULTS:  Lab Results  Component Value Date   NA  140 08/07/2018   K 4.0 08/07/2018   CL 108 08/07/2018   CO2 26 08/07/2018   GLUCOSE 103 (H) 08/07/2018   BUN 14 08/07/2018   CREATININE 0.88 08/07/2018   CALCIUM 8.7 (L) 08/07/2018   PROT 6.7 08/07/2018   ALBUMIN 3.9 08/07/2018   AST 15 08/07/2018   ALT 13 08/07/2018   ALKPHOS 56 08/07/2018   BILITOT 0.7 08/07/2018   GFRNONAA >60 08/07/2018   GFRAA >60 08/07/2018    Lab Results  Component Value Date   WBC 3.5 (L) 08/07/2018   NEUTROABS 2.2 08/07/2018   HGB 14.2 08/07/2018   HCT 42.8 08/07/2018   MCV 86.1 08/07/2018   PLT 268 08/07/2018     STUDIES: Dg Chest Port 1 View  Result Date: 07/29/2018 CLINICAL DATA:  Postop Port-A-Cath. EXAM: PORTABLE CHEST 1 VIEW COMPARISON:  Head CT dated 06/24/2018. Chest CT dated 06/13/2018. FINDINGS: LEFT chest wall port a cath is now in  place with tip at the level of the upper SVC. Heart size is within normal limits. LEFT upper lobe mass again demonstrated. No new lung findings. No pleural effusion or pneumothorax seen. Osseous structures about the chest are unremarkable. IMPRESSION: LEFT chest wall Port-A-Cath with tip at the level of the upper SVC. No pneumothorax or other procedural complicating feature seen. Electronically Signed   By: Franki Cabot M.D.   On: 07/29/2018 11:57   Dg Fluoro Guide Cv Line Left  Result Date: 07/31/2018 INDICATION: History of metastatic rectal cancer. Patient underwent surgical placement of a left subclavian vein approach port a catheter on 07/29/2018 however presented today for his initial chemotherapy session with difficulty aspirating and flushing from the recently placed port a catheter. As such, patient presents today for fluoroscopic guided Port a catheter evaluation. EXAM: FLUOROSCOPIC GUIDED PORT A CATHETER CHECK MEDICATIONS: None. CONTRAST:  None FLUOROSCOPY TIME:  18 seconds (2.6 mGy) COMPLICATIONS: None immediate. TECHNIQUE: The procedure, risks, benefits, and alternatives were explained to the patient and informed written consent was obtained. A timeout was performed prior to the initiation of the procedure. The patient's chest port a catheter was accessed by the IV team. The patient was placed supine on the fluoroscopy table. A preprocedural spot fluoroscopic image was obtained of the chest in existing port a catheter. Note was made of difficulty aspirating blood from the port a catheter. Contrast was injected via the Port a catheter and images were reviewed. The Port a catheter was flushed with a heparin dwell and de accessed. A dressing was placed. The patient tolerated the procedure well without immediate postprocedural complication. FINDINGS: Preprocedural spot fluoroscopic image demonstrates tip of left subclavian vein approach port a catheter tip lying horizontally over the superior aspect of  the SVC. Note is also made of an abrupt kink peripheral to the suspected left subclavian vein venotomy site. Note was made of inability to aspirate from the port a catheter. Contrast injection was also noted to be difficult, presumably secondary to the abrupt kinking of the mid aspect of the Port a catheter tubing. Injection demonstrates the tip of the Port a catheter tubing is indeed located within the superior aspect of the SVC with tip in close proximity to the right lateral wall of the SVC as well as the origin of the azygos vein. No discrete areas of contrast extravasation. IMPRESSION: Difficulty aspirating from the recently placed subclavian port catheter is multifactorial including suboptimal positioning of the Port a catheter tip which resides in close proximity to the right lateral wall  the SVC and origin of the azygos as well as abrupt kinking of the Port a catheter tubing peripheral to it's entrance into the left subclavian vein. Based on the above, port revision is recommended. Above findings discussed with Dr. Grayland Ormond at the time of examination completion. Electronically Signed   By: Sandi Mariscal M.D.   On: 07/31/2018 13:10   Dg C-arm 1-60 Min-no Report  Result Date: 08/05/2018 Fluoroscopy was utilized by the requesting physician.  No radiographic interpretation.   Dg C-arm 1-60 Min-no Report  Result Date: 07/29/2018 Fluoroscopy was utilized by the requesting physician.  No radiographic interpretation.    ASSESSMENT: Recurrent, stage IV rectal adenocarcinoma.  PLAN:    1.  Recurrent, stage IV rectal adenocarcinoma: Patient was initially considered a clinical stage IIIa, but after completing neoadjuvant 5-FU and XRT followed by surgical excision on October 08, 2013 he was noted to be pathologic stage Ia.  He declined adjuvant FOLFOX at that time.  More recently, patient's CEA started trending up and CT of the chest revealed a large 7.7 cm left upper lobe mass biopsy consistent with rectal  adenocarcinoma.  PET scan completed on June 24, 2018 did not reveal any other evidence of disease.  Case was discussed with thoracic surgery and given the size and location of his mass, surgical resection is not possible.  Treatment with XRT was also discussed, but it was agreed upon that patient should pursue FOLFOX plus Avastin as initial treatment and consider XRT later if there is any residual disease.  Proceed with cycle 1 of FOLFOX plus Avastin.  Will hold Avastin this cycle because of his recent port placement.  Return to clinic in 2 days for pump removal and then in 2 weeks for consideration of cycle 2 which will include Avastin.   2.  Leukopenia: Chronic and unchanged. 3.  Hyphae seen on biopsy: Serum galactomannan is currently pending.   Patient expressed understanding and was in agreement with this plan. He also understands that He can call clinic at any time with any questions, concerns, or complaints.    Lloyd Huger, MD   08/08/2018 7:05 AM

## 2018-08-09 ENCOUNTER — Encounter: Payer: Self-pay | Admitting: Oncology

## 2018-08-09 ENCOUNTER — Other Ambulatory Visit: Payer: Self-pay

## 2018-08-09 ENCOUNTER — Inpatient Hospital Stay: Payer: 59

## 2018-08-09 VITALS — BP 149/98 | HR 90 | Resp 19

## 2018-08-09 DIAGNOSIS — C20 Malignant neoplasm of rectum: Secondary | ICD-10-CM

## 2018-08-09 DIAGNOSIS — D72819 Decreased white blood cell count, unspecified: Secondary | ICD-10-CM | POA: Diagnosis not present

## 2018-08-09 DIAGNOSIS — Z5111 Encounter for antineoplastic chemotherapy: Secondary | ICD-10-CM | POA: Diagnosis not present

## 2018-08-09 DIAGNOSIS — Z5112 Encounter for antineoplastic immunotherapy: Secondary | ICD-10-CM | POA: Diagnosis not present

## 2018-08-09 MED ORDER — SODIUM CHLORIDE 0.9% FLUSH
10.0000 mL | INTRAVENOUS | Status: DC | PRN
  Administered 2018-08-09: 10 mL
  Filled 2018-08-09: qty 10

## 2018-08-09 MED ORDER — HEPARIN SOD (PORK) LOCK FLUSH 100 UNIT/ML IV SOLN
500.0000 [IU] | Freq: Once | INTRAVENOUS | Status: AC | PRN
  Administered 2018-08-09: 500 [IU]
  Filled 2018-08-09: qty 5

## 2018-08-16 ENCOUNTER — Encounter: Payer: Self-pay | Admitting: Urology

## 2018-08-17 ENCOUNTER — Encounter: Payer: Self-pay | Admitting: Oncology

## 2018-08-18 NOTE — Progress Notes (Signed)
[place]  Telephone:(336) [phone] Fax:(336) [phone]  ID: [name] [name] OB: 04-Dec-1955  MR#: [id number]  VQM#:[id number]  Patient Care Team: Hubbard Hartshorn, FNP as PCP - General (Family Medicine) Marlyce Huge, MD as Attending Physician (Surgery) Lloyd Huger, MD as Consulting Physician (Oncology) Abbie Sons, MD (Urology) Eulogio Bear, MD as Consulting Physician (Ophthalmology) Flora Lipps, MD as Consulting Physician (Pulmonary Disease)   CHIEF COMPLAINT: Recurrent, stage IV rectal adenocarcinoma.  INTERVAL HISTORY: Patient returns to clinic today for further evaluation and consideration of cycle 2 of FOLFOX plus Avastin.  He tolerated his first treatment well without significant side effects.  He currently feels well and is asymptomatic.  His port is now functioning well.  He has no issues with his colostomy.  He has no neurologic complaints.  He denies any recent fevers or illnesses.  He denies any chest pain, shortness of breath, or hemoptysis.  He denies nausea, vomiting, constipation, or diarrhea. He has noted no blood or melena in his colostomy bag.  He has no urinary complaints.  Patient offers no specific complaints today.  REVIEW OF SYSTEMS:   Review of Systems  Constitutional: Negative.  Negative for fever, malaise/fatigue and weight loss.  Respiratory: Positive for cough. Negative for shortness of breath.   Cardiovascular: Negative.  Negative for chest pain and leg swelling.  Gastrointestinal: Negative.  Negative for abdominal pain, blood in stool, constipation, diarrhea and melena.  Genitourinary: Negative.  Negative for dysuria.  Musculoskeletal: Negative.  Negative for back pain.  Skin: Negative.  Negative for rash.  Neurological: Negative.  Negative for focal weakness, weakness and headaches.  Psychiatric/Behavioral: Negative.  The patient is not nervous/anxious.     As per HPI. Otherwise, a complete review of systems  is negative.  PAST MEDICAL HISTORY: Past Medical History:  Diagnosis Date   Anemia    Cancer ([place])    rectal 2015, followed by Oncology, permanent colostomy   Colostomy in place [facility])    History of kidney stones    Seasonal allergies     PAST SURGICAL HISTORY: Past Surgical History:  Procedure Laterality Date   BACK SURGERY     CHOLECYSTECTOMY     COLON SURGERY     ENDOBRONCHIAL ULTRASOUND N/A 07/01/2018   Procedure: ENDOBRONCHIAL ULTRASOUND;  Surgeon: Tyler Pita, MD;  Location: [facility] ORS;  Service: Cardiopulmonary;  Laterality: N/A;   KNEE SURGERY Bilateral    PORT A CATH REVISION Left 08/05/2018   Procedure: PORT A CATH REVISION, REPOSITION LEFT;  Surgeon: Robert Bellow, MD;  Location: [facility] ORS;  Service: General;  Laterality: Left;   PORT-A-CATH REMOVAL Right 12/02/2014   Procedure: REMOVAL PORT-A-CATH;  Surgeon: Marlyce Huge, MD;  Location: [facility] ORS;  Service: General;  Laterality: Right;   PORTACATH PLACEMENT     PORTACATH PLACEMENT Left 07/29/2018   Procedure: INSERTION PORT-A-CATH LEFT;  Surgeon: Robert Bellow, MD;  Location: [facility] ORS;  Service: General;  Laterality: Left;    FAMILY HISTORY: Father with prostate cancer.  COPD, CHF.     ADVANCED DIRECTIVES:    HEALTH MAINTENANCE: Social History   Tobacco Use   Smoking status: Never Smoker   Smokeless tobacco: Current User    Types: Snuff   Tobacco comment: might smoke a cigar once every 3 months  Substance Use Topics   Alcohol use: Yes    Alcohol/week: 0.0 standard drinks    Comment: RARE   Drug use: No     Colonoscopy:  PAP:  Bone density:  Lipid panel:  Allergies  Allergen Reactions   Sulfa Antibiotics Hives    Other reaction(s): Other (qualifier value)    Current Outpatient Medications  Medication Sig Dispense Refill   albuterol (VENTOLIN HFA) 108 (90 Base) MCG/ACT inhaler Inhale 2 puffs into the lungs every 6 (six) hours as needed for wheezing or  shortness of breath (cough). 1 Inhaler 2   cetirizine (ZYRTEC) 10 MG tablet Take 10 mg by mouth at bedtime.      Dextromethorphan-guaiFENesin (MUCINEX DM MAXIMUM STRENGTH) 60-1200 MG TB12 Take 1 tablet by mouth at bedtime as needed (congestion/drainage.).     gabapentin (NEURONTIN) 100 MG capsule Take 1 capsule once daily before bed x2 days, then take 2 capsules once daily before bed x2 days, then may increase to 3 capsules once daily before bed if needed. (Patient taking differently: Take 200 mg by mouth at bedtime. ) 90 capsule 1   guaiFENesin (MUCINEX) 600 MG 12 hr tablet Take 1 tablet (600 mg total) by mouth 2 (two) times daily. 20 tablet 0   lidocaine-prilocaine (EMLA) cream Apply to affected area once (Patient taking differently: Apply 1 application topically daily as needed (prior to port being accessed.). ) 30 g 3   loperamide (IMODIUM A-D) 2 MG capsule Take 2 mg by mouth as needed for diarrhea or loose stools.      Multiple Vitamin (MULTIVITAMIN WITH MINERALS) TABS tablet Take 1 tablet by mouth at bedtime.     NEEDLE, DISP, 18 G 18G X 1" MISC Use as directed 50 each 0   ondansetron (ZOFRAN) 8 MG tablet Take 1 tablet (8 mg total) by mouth 2 (two) times daily as needed for refractory nausea / vomiting. 30 tablet 2   Oxymetazoline HCl (AFRIN EXTRA MOISTURIZING NA) Place 1 spray into the nose 2 (two) times daily as needed (congestion.).     prochlorperazine (COMPAZINE) 10 MG tablet Take 1 tablet (10 mg total) by mouth every 6 (six) hours as needed (Nausea or vomiting). 60 tablet 2   SYRINGE-NEEDLE, DISP, 3 ML (LUER LOCK SAFETY SYRINGES) 21G X 1-1/2" 3 ML MISC Use as directed for testosterone administration 50 each 0   tadalafil (ADCIRCA/CIALIS) 20 MG tablet Take 1 tablet (20 mg total) by mouth daily as needed for erectile dysfunction. 6 tablet 10   testosterone cypionate (DEPOTESTOSTERONE CYPIONATE) 200 MG/ML injection Inject 1 mL (200 mg total) into the muscle every 14 (fourteen)  days. 10 mL 0   timolol (BETIMOL) 0.5 % ophthalmic solution Place 1 drop into the right eye at bedtime.      chlorpheniramine-HYDROcodone (TUSSIONEX PENNKINETIC ER) 10-8 MG/5ML SUER Take 5 mLs by mouth every 12 (twelve) hours as needed for cough. 200 mL 0   No current facility-administered medications for this visit.     OBJECTIVE: Vitals:   08/21/18 0852  BP: 129/87  Pulse: 97  Resp: 18  Temp: (!) 97.2 F (36.2 C)     Body mass index is 32.97 kg/m.    ECOG FS:0 - Asymptomatic  General: Well-developed, well-nourished, no acute distress. Eyes: Pink conjunctiva, anicteric sclera. HEENT: Normocephalic, moist mucous membranes. Lungs: Clear to auscultation bilaterally. Heart: Regular rate and rhythm. No rubs, murmurs, or gallops. Abdomen: Soft, nontender, nondistended. No organomegaly noted, normoactive bowel sounds.  Colostomy bag noted. Musculoskeletal: No edema, cyanosis, or clubbing. Neuro: Alert, answering all questions appropriately. Cranial nerves grossly intact. Skin: No rashes or petechiae noted. Psych: Normal affect.  LAB RESULTS:  Lab Results  Component Value Date  NA 137 08/21/2018   K 3.9 08/21/2018   CL 106 08/21/2018   CO2 25 08/21/2018   GLUCOSE 108 (H) 08/21/2018   BUN 16 08/21/2018   CREATININE 1.21 08/21/2018   CALCIUM 8.5 (L) 08/21/2018   PROT 7.0 08/21/2018   ALBUMIN 4.1 08/21/2018   AST 17 08/21/2018   ALT 15 08/21/2018   ALKPHOS 56 08/21/2018   BILITOT 1.1 08/21/2018   GFRNONAA >60 08/21/2018   GFRAA >60 08/21/2018    Lab Results  Component Value Date   WBC 4.6 08/21/2018   NEUTROABS 2.9 08/21/2018   HGB 15.1 08/21/2018   HCT 45.7 08/21/2018   MCV 86.1 08/21/2018   PLT 324 08/21/2018     STUDIES: Dg Chest Port 1 View  Result Date: 07/29/2018 CLINICAL DATA:  Postop Port-A-Cath. EXAM: PORTABLE CHEST 1 VIEW COMPARISON:  Head CT dated 06/24/2018. Chest CT dated 06/13/2018. FINDINGS: LEFT chest wall port a cath is now in place with tip  at the level of the upper SVC. Heart size is within normal limits. LEFT upper lobe mass again demonstrated. No new lung findings. No pleural effusion or pneumothorax seen. Osseous structures about the chest are unremarkable. IMPRESSION: LEFT chest wall Port-A-Cath with tip at the level of the upper SVC. No pneumothorax or other procedural complicating feature seen. Electronically Signed   By: Franki Cabot M.D.   On: 07/29/2018 11:57   Dg Fluoro Guide Cv Line Left  Result Date: 07/31/2018 INDICATION: History of metastatic rectal cancer. Patient underwent surgical placement of a left subclavian vein approach port a catheter on 07/29/2018 however presented today for his initial chemotherapy session with difficulty aspirating and flushing from the recently placed port a catheter. As such, patient presents today for fluoroscopic guided Port a catheter evaluation. EXAM: FLUOROSCOPIC GUIDED PORT A CATHETER CHECK MEDICATIONS: None. CONTRAST:  None FLUOROSCOPY TIME:  18 seconds (2.6 mGy) COMPLICATIONS: None immediate. TECHNIQUE: The procedure, risks, benefits, and alternatives were explained to the patient and informed written consent was obtained. A timeout was performed prior to the initiation of the procedure. The patient's chest port a catheter was accessed by the IV team. The patient was placed supine on the fluoroscopy table. A preprocedural spot fluoroscopic image was obtained of the chest in existing port a catheter. Note was made of difficulty aspirating blood from the port a catheter. Contrast was injected via the Port a catheter and images were reviewed. The Port a catheter was flushed with a heparin dwell and de accessed. A dressing was placed. The patient tolerated the procedure well without immediate postprocedural complication. FINDINGS: Preprocedural spot fluoroscopic image demonstrates tip of left subclavian vein approach port a catheter tip lying horizontally over the superior aspect of the SVC. Note is  also made of an abrupt kink peripheral to the suspected left subclavian vein venotomy site. Note was made of inability to aspirate from the port a catheter. Contrast injection was also noted to be difficult, presumably secondary to the abrupt kinking of the mid aspect of the Port a catheter tubing. Injection demonstrates the tip of the Port a catheter tubing is indeed located within the superior aspect of the SVC with tip in close proximity to the right lateral wall of the SVC as well as the origin of the azygos vein. No discrete areas of contrast extravasation. IMPRESSION: Difficulty aspirating from the recently placed subclavian port catheter is multifactorial including suboptimal positioning of the Port a catheter tip which resides in close proximity to the right lateral wall  the SVC and origin of the azygos as well as abrupt kinking of the Port a catheter tubing peripheral to it's entrance into the left subclavian vein. Based on the above, port revision is recommended. Above findings discussed with Dr. Grayland Ormond at the time of examination completion. Electronically Signed   By: Sandi Mariscal M.D.   On: 07/31/2018 13:10   Dg C-arm 1-60 Min-no Report  Result Date: 08/05/2018 Fluoroscopy was utilized by the requesting physician.  No radiographic interpretation.   Dg C-arm 1-60 Min-no Report  Result Date: 07/29/2018 Fluoroscopy was utilized by the requesting physician.  No radiographic interpretation.    ASSESSMENT: Recurrent, stage IV rectal adenocarcinoma.  PLAN:    1.  Recurrent, stage IV rectal adenocarcinoma: Patient was initially considered a clinical stage IIIa, but after completing neoadjuvant 5-FU and XRT followed by surgical excision on October 08, 2013 he was noted to be pathologic stage Ia.  He declined adjuvant FOLFOX at that time.  More recently, patient's CEA started trending up and CT of the chest revealed a large 7.7 cm left upper lobe mass biopsy consistent with rectal adenocarcinoma.   PET scan completed on June 24, 2018 did not reveal any other evidence of disease.  Case was discussed with thoracic surgery and given the size and location of his mass, surgical resection is not possible.  Treatment with XRT was also discussed, but it was agreed upon that patient should pursue FOLFOX plus Avastin as initial treatment and consider XRT later if there is any residual disease.  Proceed with cycle 2 of FOLFOX plus Avastin today.  Return to clinic in 2 days for pump removal and then in 2 weeks for further evaluation and consideration of cycle 3.  Will reimage after cycle 6.   2.  Leukopenia: Resolved. 3.  Hyphae seen on biopsy: Serum galactomannan is still pending.  I spent a total of 30 minutes face-to-face with the patient of which greater than 50% of the visit was spent in counseling and coordination of care as detailed above.   Patient expressed understanding and was in agreement with this plan. He also understands that He can call clinic at any time with any questions, concerns, or complaints.    Lloyd Huger, MD   08/22/2018 9:14 AM

## 2018-08-19 ENCOUNTER — Other Ambulatory Visit: Payer: Self-pay

## 2018-08-19 ENCOUNTER — Other Ambulatory Visit: Payer: 59

## 2018-08-19 DIAGNOSIS — C20 Malignant neoplasm of rectum: Secondary | ICD-10-CM

## 2018-08-20 ENCOUNTER — Ambulatory Visit: Payer: 59 | Admitting: Urology

## 2018-08-21 ENCOUNTER — Inpatient Hospital Stay: Payer: 59

## 2018-08-21 ENCOUNTER — Encounter: Payer: Self-pay | Admitting: Oncology

## 2018-08-21 ENCOUNTER — Other Ambulatory Visit: Payer: Self-pay

## 2018-08-21 ENCOUNTER — Inpatient Hospital Stay (HOSPITAL_BASED_OUTPATIENT_CLINIC_OR_DEPARTMENT_OTHER): Payer: 59 | Admitting: Oncology

## 2018-08-21 VITALS — BP 129/87 | HR 97 | Temp 97.2°F | Resp 18 | Wt 263.8 lb

## 2018-08-21 DIAGNOSIS — Z5112 Encounter for antineoplastic immunotherapy: Secondary | ICD-10-CM | POA: Diagnosis not present

## 2018-08-21 DIAGNOSIS — C20 Malignant neoplasm of rectum: Secondary | ICD-10-CM | POA: Diagnosis not present

## 2018-08-21 DIAGNOSIS — R05 Cough: Secondary | ICD-10-CM

## 2018-08-21 DIAGNOSIS — D72819 Decreased white blood cell count, unspecified: Secondary | ICD-10-CM

## 2018-08-21 DIAGNOSIS — R059 Cough, unspecified: Secondary | ICD-10-CM

## 2018-08-21 DIAGNOSIS — Z5111 Encounter for antineoplastic chemotherapy: Secondary | ICD-10-CM | POA: Diagnosis not present

## 2018-08-21 LAB — CBC WITH DIFFERENTIAL/PLATELET
Abs Immature Granulocytes: 0.02 10*3/uL (ref 0.00–0.07)
Basophils Absolute: 0.1 10*3/uL (ref 0.0–0.1)
Basophils Relative: 1 %
Eosinophils Absolute: 0.2 10*3/uL (ref 0.0–0.5)
Eosinophils Relative: 4 %
HCT: 45.7 % (ref 39.0–52.0)
Hemoglobin: 15.1 g/dL (ref 13.0–17.0)
Immature Granulocytes: 0 %
Lymphocytes Relative: 20 %
Lymphs Abs: 0.9 10*3/uL (ref 0.7–4.0)
MCH: 28.4 pg (ref 26.0–34.0)
MCHC: 33 g/dL (ref 30.0–36.0)
MCV: 86.1 fL (ref 80.0–100.0)
Monocytes Absolute: 0.6 10*3/uL (ref 0.1–1.0)
Monocytes Relative: 12 %
Neutro Abs: 2.9 10*3/uL (ref 1.7–7.7)
Neutrophils Relative %: 63 %
Platelets: 324 10*3/uL (ref 150–400)
RBC: 5.31 MIL/uL (ref 4.22–5.81)
RDW: 15.2 % (ref 11.5–15.5)
WBC: 4.6 10*3/uL (ref 4.0–10.5)
nRBC: 0 % (ref 0.0–0.2)

## 2018-08-21 LAB — URINALYSIS, COMPLETE (UACMP) WITH MICROSCOPIC
Bacteria, UA: NONE SEEN
Bilirubin Urine: NEGATIVE
Glucose, UA: NEGATIVE mg/dL
Hgb urine dipstick: NEGATIVE
Ketones, ur: NEGATIVE mg/dL
Leukocytes,Ua: NEGATIVE
Nitrite: NEGATIVE
Protein, ur: NEGATIVE mg/dL
Specific Gravity, Urine: 1.025 (ref 1.005–1.030)
Squamous Epithelial / HPF: NONE SEEN (ref 0–5)
pH: 5 (ref 5.0–8.0)

## 2018-08-21 LAB — PSA: Prostatic Specific Antigen: 4.16 ng/mL — ABNORMAL HIGH (ref 0.00–4.00)

## 2018-08-21 LAB — COMPREHENSIVE METABOLIC PANEL
ALT: 15 U/L (ref 0–44)
AST: 17 U/L (ref 15–41)
Albumin: 4.1 g/dL (ref 3.5–5.0)
Alkaline Phosphatase: 56 U/L (ref 38–126)
Anion gap: 6 (ref 5–15)
BUN: 16 mg/dL (ref 8–23)
CO2: 25 mmol/L (ref 22–32)
Calcium: 8.5 mg/dL — ABNORMAL LOW (ref 8.9–10.3)
Chloride: 106 mmol/L (ref 98–111)
Creatinine, Ser: 1.21 mg/dL (ref 0.61–1.24)
GFR calc Af Amer: >60 mL/min (ref >60)
GFR calc non Af Amer: >60 mL/min (ref >60)
Glucose, Bld: 108 mg/dL — ABNORMAL HIGH (ref 70–99)
Potassium: 3.9 mmol/L (ref 3.5–5.1)
Sodium: 137 mmol/L (ref 135–145)
Total Bilirubin: 1.1 mg/dL (ref 0.3–1.2)
Total Protein: 7 g/dL (ref 6.5–8.1)

## 2018-08-21 MED ORDER — DEXTROSE 5 % IV SOLN
Freq: Once | INTRAVENOUS | Status: AC
  Administered 2018-08-21: 11:00:00 via INTRAVENOUS
  Filled 2018-08-21: qty 250

## 2018-08-21 MED ORDER — LEUCOVORIN CALCIUM INJECTION 350 MG
391.0000 mg/m2 | Freq: Once | INTRAVENOUS | Status: AC
  Administered 2018-08-21: 1000 mg via INTRAVENOUS
  Filled 2018-08-21: qty 50

## 2018-08-21 MED ORDER — PALONOSETRON HCL INJECTION 0.25 MG/5ML
0.2500 mg | Freq: Once | INTRAVENOUS | Status: AC
  Administered 2018-08-21: 0.25 mg via INTRAVENOUS
  Filled 2018-08-21: qty 5

## 2018-08-21 MED ORDER — SODIUM CHLORIDE 0.9 % IV SOLN
INTRAVENOUS | Status: DC
  Administered 2018-08-21: 10:00:00 via INTRAVENOUS
  Filled 2018-08-21: qty 250

## 2018-08-21 MED ORDER — SODIUM CHLORIDE 0.9 % IV SOLN
4.9000 mg/kg | Freq: Once | INTRAVENOUS | Status: AC
  Administered 2018-08-21: 11:00:00 600 mg via INTRAVENOUS
  Filled 2018-08-21: qty 16

## 2018-08-21 MED ORDER — OXALIPLATIN CHEMO INJECTION 100 MG/20ML
85.0000 mg/m2 | Freq: Once | INTRAVENOUS | Status: AC
  Administered 2018-08-21: 220 mg via INTRAVENOUS
  Filled 2018-08-21: qty 40

## 2018-08-21 MED ORDER — FLUOROURACIL CHEMO INJECTION 2.5 GM/50ML
400.0000 mg/m2 | Freq: Once | INTRAVENOUS | Status: AC
  Administered 2018-08-21: 13:00:00 1000 mg via INTRAVENOUS
  Filled 2018-08-21: qty 20

## 2018-08-21 MED ORDER — DEXAMETHASONE SODIUM PHOSPHATE 10 MG/ML IJ SOLN
10.0000 mg | Freq: Once | INTRAMUSCULAR | Status: AC
  Administered 2018-08-21: 10 mg via INTRAVENOUS
  Filled 2018-08-21: qty 1

## 2018-08-21 MED ORDER — SODIUM CHLORIDE 0.9 % IV SOLN
2400.0000 mg/m2 | INTRAVENOUS | Status: DC
  Administered 2018-08-21: 13:00:00 6150 mg via INTRAVENOUS
  Filled 2018-08-21: qty 100

## 2018-08-21 MED ORDER — HEPARIN SOD (PORK) LOCK FLUSH 100 UNIT/ML IV SOLN: 500.0000 [IU] | Freq: Once | INTRAVENOUS | Status: DC

## 2018-08-21 MED ORDER — SODIUM CHLORIDE 0.9% FLUSH
10.0000 mL | Freq: Once | INTRAVENOUS | Status: AC
  Administered 2018-08-21: 09:00:00 10 mL via INTRAVENOUS
  Filled 2018-08-21: qty 10

## 2018-08-21 NOTE — Progress Notes (Signed)
Pt here for follow up and treatment. States is feeling well. Complains of feeling tired. Requests refill of tussionex.

## 2018-08-22 LAB — TESTOSTERONE: Testosterone: 1063 ng/dL — ABNORMAL HIGH (ref 264–916)

## 2018-08-22 MED ORDER — HYDROCOD POLST-CPM POLST ER 10-8 MG/5ML PO SUER: 5.0000 mL | Freq: Two times a day (BID) | ORAL | 0 refills | Status: DC | PRN

## 2018-08-23 ENCOUNTER — Other Ambulatory Visit: Payer: Self-pay

## 2018-08-23 ENCOUNTER — Inpatient Hospital Stay: Payer: 59

## 2018-08-23 VITALS — BP 146/87 | HR 86

## 2018-08-23 DIAGNOSIS — D72819 Decreased white blood cell count, unspecified: Secondary | ICD-10-CM | POA: Diagnosis not present

## 2018-08-23 DIAGNOSIS — E291 Testicular hypofunction: Secondary | ICD-10-CM

## 2018-08-23 DIAGNOSIS — Z5111 Encounter for antineoplastic chemotherapy: Secondary | ICD-10-CM | POA: Diagnosis not present

## 2018-08-23 DIAGNOSIS — Z5112 Encounter for antineoplastic immunotherapy: Secondary | ICD-10-CM | POA: Diagnosis not present

## 2018-08-23 DIAGNOSIS — C20 Malignant neoplasm of rectum: Secondary | ICD-10-CM

## 2018-08-23 MED ORDER — HEPARIN SOD (PORK) LOCK FLUSH 100 UNIT/ML IV SOLN
500.0000 [IU] | Freq: Once | INTRAVENOUS | Status: AC | PRN
  Administered 2018-08-23: 500 [IU]
  Filled 2018-08-23: qty 5

## 2018-08-23 MED ORDER — SODIUM CHLORIDE 0.9% FLUSH
10.0000 mL | INTRAVENOUS | Status: DC | PRN
  Administered 2018-08-23: 10 mL
  Filled 2018-08-23: qty 10

## 2018-08-31 NOTE — Progress Notes (Signed)
[place]  Telephone:(336) [phone] Fax:(336) [phone]  ID: Donal Lynam Napierala OB: Dec 10, 1955  MR#: [id number]  KDT#:[id number]  Patient Care Team: Hubbard Hartshorn, FNP as PCP - General (Family Medicine) Marlyce Huge, MD as Attending Physician (Surgery) Lloyd Huger, MD as Consulting Physician (Oncology) Abbie Sons, MD (Urology) Eulogio Bear, MD as Consulting Physician (Ophthalmology) Flora Lipps, MD as Consulting Physician (Pulmonary Disease)   CHIEF COMPLAINT: Recurrent, stage IV rectal adenocarcinoma.  INTERVAL HISTORY: Patient returns to clinic today for further evaluation and consideration of cycle 3 of FOLFOX plus Avastin.  He has noted some increasing peripheral neuropathy, but this does not affect his day-to-day activity.  He otherwise feels well and is asymptomatic.  He has no other neurologic complaints.  He denies any recent fevers or illnesses.  He denies any chest pain, shortness of breath, or hemoptysis.  He denies nausea, vomiting, constipation, or diarrhea. He has noted no blood or melena in his colostomy bag.  He has no urinary complaints.  Patient offers no further specific complaints today.  REVIEW OF SYSTEMS:   Review of Systems  Constitutional: Negative.  Negative for fever, malaise/fatigue and weight loss.  Respiratory: Positive for cough. Negative for shortness of breath.   Cardiovascular: Negative.  Negative for chest pain and leg swelling.  Gastrointestinal: Negative.  Negative for abdominal pain, blood in stool, constipation, diarrhea and melena.  Genitourinary: Negative.  Negative for dysuria.  Musculoskeletal: Negative.  Negative for back pain.  Skin: Negative.  Negative for rash.  Neurological: Positive for tingling and sensory change. Negative for focal weakness, weakness and headaches.  Psychiatric/Behavioral: The patient has insomnia. The patient is not nervous/anxious.     As per HPI. Otherwise, a complete  review of systems is negative.  PAST MEDICAL HISTORY: Past Medical History:  Diagnosis Date   Anemia    Cancer ([place])    rectal 2015, followed by Oncology, permanent colostomy   Colostomy in place [facility])    History of kidney stones    Seasonal allergies     PAST SURGICAL HISTORY: Past Surgical History:  Procedure Laterality Date   BACK SURGERY     CHOLECYSTECTOMY     COLON SURGERY     ENDOBRONCHIAL ULTRASOUND N/A 07/01/2018   Procedure: ENDOBRONCHIAL ULTRASOUND;  Surgeon: Tyler Pita, MD;  Location: [facility] ORS;  Service: Cardiopulmonary;  Laterality: N/A;   KNEE SURGERY Bilateral    PORT A CATH REVISION Left 08/05/2018   Procedure: PORT A CATH REVISION, REPOSITION LEFT;  Surgeon: Robert Bellow, MD;  Location: [facility] ORS;  Service: General;  Laterality: Left;   PORT-A-CATH REMOVAL Right 12/02/2014   Procedure: REMOVAL PORT-A-CATH;  Surgeon: Marlyce Huge, MD;  Location: [facility] ORS;  Service: General;  Laterality: Right;   PORTACATH PLACEMENT     PORTACATH PLACEMENT Left 07/29/2018   Procedure: INSERTION PORT-A-CATH LEFT;  Surgeon: Robert Bellow, MD;  Location: [facility] ORS;  Service: General;  Laterality: Left;    FAMILY HISTORY: Father with prostate cancer.  COPD, CHF.     ADVANCED DIRECTIVES:    HEALTH MAINTENANCE: Social History   Tobacco Use   Smoking status: Never Smoker   Smokeless tobacco: Current User    Types: Snuff   Tobacco comment: might smoke a cigar once every 3 months  Substance Use Topics   Alcohol use: Yes    Alcohol/week: 0.0 standard drinks    Comment: RARE   Drug use: No     Colonoscopy:  PAP:  Bone  density:  Lipid panel:  Allergies  Allergen Reactions   Sulfa Antibiotics Hives    Other reaction(s): Other (qualifier value)    Current Outpatient Medications  Medication Sig Dispense Refill   albuterol (VENTOLIN HFA) 108 (90 Base) MCG/ACT inhaler Inhale 2 puffs into the lungs every 6 (six) hours as needed for  wheezing or shortness of breath (cough). 1 Inhaler 2   cetirizine (ZYRTEC) 10 MG tablet Take 10 mg by mouth at bedtime.      chlorpheniramine-HYDROcodone (TUSSIONEX PENNKINETIC ER) 10-8 MG/5ML SUER Take 5 mLs by mouth every 8 (eight) hours as needed for cough. 200 mL 0   Dextromethorphan-guaiFENesin (MUCINEX DM MAXIMUM STRENGTH) 60-1200 MG TB12 Take 1 tablet by mouth at bedtime as needed (congestion/drainage.).     gabapentin (NEURONTIN) 100 MG capsule Take 1 capsule once daily before bed x2 days, then take 2 capsules once daily before bed x2 days, then may increase to 3 capsules once daily before bed if needed. (Patient taking differently: Take 200 mg by mouth at bedtime. ) 90 capsule 1   lidocaine-prilocaine (EMLA) cream Apply to affected area once (Patient taking differently: Apply 1 application topically daily as needed (prior to port being accessed.). ) 30 g 3   Multiple Vitamin (MULTIVITAMIN WITH MINERALS) TABS tablet Take 1 tablet by mouth at bedtime.     NEEDLE, DISP, 18 G 18G X 1" MISC Use as directed 50 each 0   Oxymetazoline HCl (AFRIN EXTRA MOISTURIZING NA) Place 1 spray into the nose 2 (two) times daily as needed (congestion.).     SYRINGE-NEEDLE, DISP, 3 ML (LUER LOCK SAFETY SYRINGES) 21G X 1-1/2" 3 ML MISC Use as directed for testosterone administration 50 each 0   tadalafil (ADCIRCA/CIALIS) 20 MG tablet Take 1 tablet (20 mg total) by mouth daily as needed for erectile dysfunction. 6 tablet 10   testosterone cypionate (DEPOTESTOSTERONE CYPIONATE) 200 MG/ML injection Inject 1 mL (200 mg total) into the muscle every 14 (fourteen) days. 10 mL 0   timolol (BETIMOL) 0.5 % ophthalmic solution Place 1 drop into the right eye at bedtime.      loperamide (IMODIUM A-D) 2 MG capsule Take 2 mg by mouth as needed for diarrhea or loose stools.      ondansetron (ZOFRAN) 8 MG tablet Take 1 tablet (8 mg total) by mouth 2 (two) times daily as needed for refractory nausea / vomiting. (Patient  not taking: Reported on 09/04/2018) 30 tablet 2   prochlorperazine (COMPAZINE) 10 MG tablet Take 1 tablet (10 mg total) by mouth every 6 (six) hours as needed (Nausea or vomiting). (Patient not taking: Reported on 09/04/2018) 60 tablet 2   zolpidem (AMBIEN) 5 MG tablet Take 1 tablet (5 mg total) by mouth at bedtime as needed for sleep. 30 tablet 0   No current facility-administered medications for this visit.    Facility-Administered Medications Ordered in Other Visits  Medication Dose Route Frequency Provider Last Rate Last Dose   fluorouracil (ADRUCIL) 6,150 mg in sodium chloride 0.9 % 127 mL chemo infusion  2,400 mg/m2 (Order-Specific) Intravenous 1 day or 1 dose Lloyd Huger, MD   6,150 mg at 09/04/18 1440    OBJECTIVE: Vitals:   09/04/18 0949  BP: (!) 144/83  Pulse: 90  Temp: (!) 97.4 F (36.3 C)     Body mass index is 32.59 kg/m.    ECOG FS:0 - Asymptomatic  General: Well-developed, well-nourished, no acute distress. Eyes: Pink conjunctiva, anicteric sclera. HEENT: Normocephalic, moist mucous membranes,. Lungs:  Clear to auscultation bilaterally. Heart: Regular rate and rhythm. No rubs, murmurs, or gallops. Abdomen: Soft, nontender, nondistended. No organomegaly noted, normoactive bowel sounds. Musculoskeletal: No edema, cyanosis, or clubbing. Neuro: Alert, answering all questions appropriately. Cranial nerves grossly intact. Skin: No rashes or petechiae noted. Psych: Normal affect.  LAB RESULTS:  Lab Results  Component Value Date   NA 139 09/04/2018   K 3.7 09/04/2018   CL 106 09/04/2018   CO2 24 09/04/2018   GLUCOSE 111 (H) 09/04/2018   BUN 14 09/04/2018   CREATININE 1.03 09/04/2018   CALCIUM 8.5 (L) 09/04/2018   PROT 6.5 09/04/2018   ALBUMIN 3.7 09/04/2018   AST 24 09/04/2018   ALT 23 09/04/2018   ALKPHOS 63 09/04/2018   BILITOT 0.8 09/04/2018   GFRNONAA >60 09/04/2018   GFRAA >60 09/04/2018    Lab Results  Component Value Date   WBC 3.0 (L)  09/04/2018   NEUTROABS 1.5 (L) 09/04/2018   HGB 14.5 09/04/2018   HCT 43.6 09/04/2018   MCV 87.6 09/04/2018   PLT 220 09/04/2018     STUDIES: No results found.  ASSESSMENT: Recurrent, stage IV rectal adenocarcinoma.  PLAN:    1.  Recurrent, stage IV rectal adenocarcinoma: Patient was initially considered a clinical stage IIIa, but after completing neoadjuvant 5-FU and XRT followed by surgical excision on October 08, 2013 he was noted to be pathologic stage Ia.  He declined adjuvant FOLFOX at that time.  More recently, patient's CEA started trending up and CT of the chest revealed a large 7.7 cm left upper lobe mass biopsy consistent with rectal adenocarcinoma.  PET scan completed on June 24, 2018 did not reveal any other evidence of disease.  Case was discussed with thoracic surgery and given the size and location of his mass, surgical resection is not possible.  Treatment with XRT was also discussed, but it was agreed upon that patient should pursue FOLFOX plus Avastin as initial treatment and consider XRT later if there is any residual disease.  Proceed with cycle 3 of FOLFOX plus Avastin today.  Return to clinic in 2 days for pump removal and then in 2 weeks for further evaluation and consideration of cycle 4.  Plan to reimage at the conclusion of cycle 6. 2.  Leukopenia: Mild, monitor. 3.  Hyphae seen on biopsy: Serum galactomannan is still pending. 4.  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 5.  Cough: Continue Tessalon Perles as needed. 6.  Insomnia: Patient was given a prescription for Ambien.   Patient expressed understanding and was in agreement with this plan. He also understands that He can call clinic at any time with any questions, concerns, or complaints.    Lloyd Huger, MD   09/04/2018 3:56 PM

## 2018-09-02 MED FILL — TADALAFIL 20 MG TABS: 20 | 30 days supply | Qty: 6 | Fill #2

## 2018-09-04 ENCOUNTER — Encounter: Payer: Self-pay | Admitting: Oncology

## 2018-09-04 ENCOUNTER — Other Ambulatory Visit: Payer: Self-pay | Admitting: *Deleted

## 2018-09-04 ENCOUNTER — Inpatient Hospital Stay: Payer: 59

## 2018-09-04 ENCOUNTER — Other Ambulatory Visit: Payer: Self-pay

## 2018-09-04 ENCOUNTER — Encounter: Payer: Self-pay | Admitting: *Deleted

## 2018-09-04 ENCOUNTER — Inpatient Hospital Stay (HOSPITAL_BASED_OUTPATIENT_CLINIC_OR_DEPARTMENT_OTHER): Payer: 59 | Admitting: Oncology

## 2018-09-04 ENCOUNTER — Inpatient Hospital Stay: Payer: 59 | Attending: Oncology

## 2018-09-04 VITALS — BP 144/83 | HR 90 | Temp 97.4°F | Wt 260.7 lb

## 2018-09-04 DIAGNOSIS — Z5111 Encounter for antineoplastic chemotherapy: Secondary | ICD-10-CM | POA: Insufficient documentation

## 2018-09-04 DIAGNOSIS — G629 Polyneuropathy, unspecified: Secondary | ICD-10-CM | POA: Insufficient documentation

## 2018-09-04 DIAGNOSIS — R05 Cough: Secondary | ICD-10-CM | POA: Insufficient documentation

## 2018-09-04 DIAGNOSIS — Z79899 Other long term (current) drug therapy: Secondary | ICD-10-CM | POA: Insufficient documentation

## 2018-09-04 DIAGNOSIS — R059 Cough, unspecified: Secondary | ICD-10-CM

## 2018-09-04 DIAGNOSIS — C20 Malignant neoplasm of rectum: Secondary | ICD-10-CM

## 2018-09-04 DIAGNOSIS — D72819 Decreased white blood cell count, unspecified: Secondary | ICD-10-CM | POA: Insufficient documentation

## 2018-09-04 DIAGNOSIS — Z923 Personal history of irradiation: Secondary | ICD-10-CM | POA: Insufficient documentation

## 2018-09-04 DIAGNOSIS — G4700 Insomnia, unspecified: Secondary | ICD-10-CM | POA: Insufficient documentation

## 2018-09-04 DIAGNOSIS — Z95828 Presence of other vascular implants and grafts: Secondary | ICD-10-CM

## 2018-09-04 LAB — CBC WITH DIFFERENTIAL/PLATELET
Abs Immature Granulocytes: 0.01 10*3/uL (ref 0.00–0.07)
Basophils Absolute: 0.1 10*3/uL (ref 0.0–0.1)
Basophils Relative: 2 %
Eosinophils Absolute: 0.2 10*3/uL (ref 0.0–0.5)
Eosinophils Relative: 7 %
HCT: 43.6 % (ref 39.0–52.0)
Hemoglobin: 14.5 g/dL (ref 13.0–17.0)
Immature Granulocytes: 0 %
Lymphocytes Relative: 25 %
Lymphs Abs: 0.7 10*3/uL (ref 0.7–4.0)
MCH: 29.1 pg (ref 26.0–34.0)
MCHC: 33.3 g/dL (ref 30.0–36.0)
MCV: 87.6 fL (ref 80.0–100.0)
Monocytes Absolute: 0.4 10*3/uL (ref 0.1–1.0)
Monocytes Relative: 15 %
Neutro Abs: 1.5 10*3/uL — ABNORMAL LOW (ref 1.7–7.7)
Neutrophils Relative %: 51 %
Platelets: 220 10*3/uL (ref 150–400)
RBC: 4.98 MIL/uL (ref 4.22–5.81)
RDW: 15.6 % — ABNORMAL HIGH (ref 11.5–15.5)
WBC: 3 10*3/uL — ABNORMAL LOW (ref 4.0–10.5)
nRBC: 0 % (ref 0.0–0.2)

## 2018-09-04 LAB — COMPREHENSIVE METABOLIC PANEL
ALT: 23 U/L (ref 0–44)
AST: 24 U/L (ref 15–41)
Albumin: 3.7 g/dL (ref 3.5–5.0)
Alkaline Phosphatase: 63 U/L (ref 38–126)
Anion gap: 9 (ref 5–15)
BUN: 14 mg/dL (ref 8–23)
CO2: 24 mmol/L (ref 22–32)
Calcium: 8.5 mg/dL — ABNORMAL LOW (ref 8.9–10.3)
Chloride: 106 mmol/L (ref 98–111)
Creatinine, Ser: 1.03 mg/dL (ref 0.61–1.24)
GFR calc Af Amer: >60 mL/min (ref >60)
GFR calc non Af Amer: >60 mL/min (ref >60)
Glucose, Bld: 111 mg/dL — ABNORMAL HIGH (ref 70–99)
Potassium: 3.7 mmol/L (ref 3.5–5.1)
Sodium: 139 mmol/L (ref 135–145)
Total Bilirubin: 0.8 mg/dL (ref 0.3–1.2)
Total Protein: 6.5 g/dL (ref 6.5–8.1)

## 2018-09-04 LAB — PSA: Prostatic Specific Antigen: 4.32 ng/mL — ABNORMAL HIGH (ref 0.00–4.00)

## 2018-09-04 LAB — PROTEIN, URINE, RANDOM: Total Protein, Urine: 17 mg/dL

## 2018-09-04 MED ORDER — HYDROCOD POLST-CPM POLST ER 10-8 MG/5ML PO SUER: 5.0000 mL | Freq: Three times a day (TID) | ORAL | 0 refills | Status: DC | PRN

## 2018-09-04 MED ORDER — ZOLPIDEM TARTRATE 5 MG PO TABS: 5.0000 mg | ORAL_TABLET | Freq: Every evening | ORAL | 0 refills | Status: DC | PRN

## 2018-09-04 MED ORDER — LEUCOVORIN CALCIUM INJECTION 350 MG
1000.0000 mg | Freq: Once | INTRAVENOUS | Status: AC
  Administered 2018-09-04: 1000 mg via INTRAVENOUS
  Filled 2018-09-04: qty 50

## 2018-09-04 MED ORDER — DEXTROSE 5 % IV SOLN
Freq: Once | INTRAVENOUS | Status: AC
  Administered 2018-09-04: 12:00:00 via INTRAVENOUS
  Filled 2018-09-04: qty 250

## 2018-09-04 MED ORDER — DEXAMETHASONE SODIUM PHOSPHATE 10 MG/ML IJ SOLN
10.0000 mg | Freq: Once | INTRAMUSCULAR | Status: AC
  Administered 2018-09-04: 10 mg via INTRAVENOUS
  Filled 2018-09-04: qty 1

## 2018-09-04 MED ORDER — SODIUM CHLORIDE 0.9 % IV SOLN
Freq: Once | INTRAVENOUS | Status: AC
  Administered 2018-09-04: 11:00:00 via INTRAVENOUS
  Filled 2018-09-04: qty 250

## 2018-09-04 MED ORDER — SODIUM CHLORIDE 0.9 % IV SOLN
2400.0000 mg/m2 | INTRAVENOUS | Status: DC
  Administered 2018-09-04: 6150 mg via INTRAVENOUS
  Filled 2018-09-04: qty 100

## 2018-09-04 MED ORDER — FLUOROURACIL CHEMO INJECTION 2.5 GM/50ML
400.0000 mg/m2 | Freq: Once | INTRAVENOUS | Status: AC
  Administered 2018-09-04: 1000 mg via INTRAVENOUS
  Filled 2018-09-04: qty 20

## 2018-09-04 MED ORDER — PALONOSETRON HCL INJECTION 0.25 MG/5ML
0.2500 mg | Freq: Once | INTRAVENOUS | Status: AC
  Administered 2018-09-04: 0.25 mg via INTRAVENOUS
  Filled 2018-09-04: qty 5

## 2018-09-04 MED ORDER — SODIUM CHLORIDE 0.9% FLUSH
10.0000 mL | Freq: Once | INTRAVENOUS | Status: AC
  Administered 2018-09-04: 10:00:00 10 mL via INTRAVENOUS
  Filled 2018-09-04: qty 10

## 2018-09-04 MED ORDER — OXALIPLATIN CHEMO INJECTION 100 MG/20ML
85.0000 mg/m2 | Freq: Once | INTRAVENOUS | Status: AC
  Administered 2018-09-04: 220 mg via INTRAVENOUS
  Filled 2018-09-04: qty 40

## 2018-09-04 MED ORDER — SODIUM CHLORIDE 0.9 % IV SOLN
600.0000 mg | Freq: Once | INTRAVENOUS | Status: AC
  Administered 2018-09-04: 600 mg via INTRAVENOUS
  Filled 2018-09-04: qty 16

## 2018-09-04 MED FILL — BD TB SYRINGE 21GX1: 21G X 1" | 84 days supply | Qty: 6 | Fill #0

## 2018-09-04 NOTE — Progress Notes (Signed)
Patient states, "The neuropathy is a little worse this time. It feels like pins/needles on my finger tips, especially to touch. It's not affecting my activities of daily living or function right now. I wanted to keep Dr. Grayland Ormond updated." Secure chat message sent to MD, Dr. Grayland Ormond. Patient does not want to wait for a response from Dr. Grayland Ormond. Patient is ready to discharge to home at this time. Patient states, "When I get home I am planning to send Dr. Grayland Ormond a my chart message about the neuropathy to help keep track."  [id number]- Per MD, Dr. Grayland Ormond: MD will make a note and follow up at patient's next clinic visit.

## 2018-09-05 LAB — CEA: CEA: 4.5 ng/mL (ref 0.0–4.7)

## 2018-09-05 LAB — TESTOSTERONE: Testosterone: 1044 ng/dL — ABNORMAL HIGH (ref 264–916)

## 2018-09-06 ENCOUNTER — Other Ambulatory Visit: Payer: Self-pay

## 2018-09-06 ENCOUNTER — Inpatient Hospital Stay: Payer: 59

## 2018-09-06 DIAGNOSIS — C20 Malignant neoplasm of rectum: Secondary | ICD-10-CM

## 2018-09-06 DIAGNOSIS — D72819 Decreased white blood cell count, unspecified: Secondary | ICD-10-CM | POA: Diagnosis not present

## 2018-09-06 DIAGNOSIS — Z79899 Other long term (current) drug therapy: Secondary | ICD-10-CM | POA: Diagnosis not present

## 2018-09-06 DIAGNOSIS — Z5111 Encounter for antineoplastic chemotherapy: Secondary | ICD-10-CM | POA: Diagnosis not present

## 2018-09-06 DIAGNOSIS — Z923 Personal history of irradiation: Secondary | ICD-10-CM | POA: Diagnosis not present

## 2018-09-06 DIAGNOSIS — R05 Cough: Secondary | ICD-10-CM | POA: Diagnosis not present

## 2018-09-06 DIAGNOSIS — G629 Polyneuropathy, unspecified: Secondary | ICD-10-CM | POA: Diagnosis not present

## 2018-09-06 DIAGNOSIS — G4700 Insomnia, unspecified: Secondary | ICD-10-CM | POA: Diagnosis not present

## 2018-09-06 MED ORDER — HEPARIN SOD (PORK) LOCK FLUSH 100 UNIT/ML IV SOLN
INTRAVENOUS | Status: AC
  Filled 2018-09-06: qty 5

## 2018-09-06 MED ORDER — SODIUM CHLORIDE 0.9% FLUSH
10.0000 mL | INTRAVENOUS | Status: DC | PRN
  Administered 2018-09-06: 14:00:00 10 mL via INTRAVENOUS
  Filled 2018-09-06: qty 10

## 2018-09-06 MED ORDER — HEPARIN SOD (PORK) LOCK FLUSH 100 UNIT/ML IV SOLN
500.0000 [IU] | Freq: Once | INTRAVENOUS | Status: AC
  Administered 2018-09-06: 14:00:00 500 [IU] via INTRAVENOUS

## 2018-09-07 DIAGNOSIS — C20 Malignant neoplasm of rectum: Secondary | ICD-10-CM | POA: Diagnosis not present

## 2018-09-11 ENCOUNTER — Other Ambulatory Visit: Payer: Self-pay | Admitting: Oncology

## 2018-09-13 NOTE — Progress Notes (Signed)
[place]  [phone] Fax:(336) [phone]  ID: [name] [name] OB: Aug 20, 1955  MR#: [id number]  OIZ#:[id number]  Patient Care Team: Hubbard Hartshorn, FNP as PCP - General (Family Medicine) Marlyce Huge, MD as Attending Physician (Surgery) Lloyd Huger, MD as Consulting Physician (Oncology) Abbie Sons, MD (Urology) Eulogio Bear, MD as Consulting Physician (Ophthalmology) Flora Lipps, MD as Consulting Physician (Pulmonary Disease)   CHIEF COMPLAINT: Recurrent, stage IV rectal adenocarcinoma.  INTERVAL HISTORY: Patient returns to clinic today for further evaluation and consideration of cycle 4 of FOLFOX plus Avastin.  His peripheral neuropathy is evident for several days after treatment, but then resolves.  He currently feels well and is asymptomatic.  He has no other neurologic complaints.  He denies any recent fevers or illnesses.  He denies any chest pain, shortness of breath, or hemoptysis.  He denies nausea, vomiting, constipation, or diarrhea. He has noted no blood or melena in his colostomy bag.  He has no urinary complaints.  Patient offers no specific complaints today.  REVIEW OF SYSTEMS:   Review of Systems  Constitutional: Negative.  Negative for fever, malaise/fatigue and weight loss.  Respiratory: Positive for cough. Negative for shortness of breath.   Cardiovascular: Negative.  Negative for chest pain and leg swelling.  Gastrointestinal: Negative.  Negative for abdominal pain, blood in stool, constipation, diarrhea and melena.  Genitourinary: Negative.  Negative for dysuria.  Musculoskeletal: Negative.  Negative for back pain.  Skin: Negative.  Negative for rash.  Neurological: Negative.  Negative for tingling, sensory change, focal weakness, weakness and headaches.  Psychiatric/Behavioral: The patient is not nervous/anxious and does not have insomnia.     As per HPI. Otherwise, a complete review of systems is  negative.  PAST MEDICAL HISTORY: Past Medical History:  Diagnosis Date  . Anemia   . Cancer [facility])    rectal 2015, followed by Oncology, permanent colostomy  . Colostomy in place [facility])   . History of kidney stones   . Seasonal allergies     PAST SURGICAL HISTORY: Past Surgical History:  Procedure Laterality Date  . BACK SURGERY    . CHOLECYSTECTOMY    . COLON SURGERY    . ENDOBRONCHIAL ULTRASOUND N/A 07/01/2018   Procedure: ENDOBRONCHIAL ULTRASOUND;  Surgeon: Tyler Pita, MD;  Location: [facility] ORS;  Service: Cardiopulmonary;  Laterality: N/A;  . KNEE SURGERY Bilateral   . PORT A CATH REVISION Left 08/05/2018   Procedure: PORT A CATH REVISION, REPOSITION LEFT;  Surgeon: Robert Bellow, MD;  Location: [facility] ORS;  Service: General;  Laterality: Left;  . PORT-A-CATH REMOVAL Right 12/02/2014   Procedure: REMOVAL PORT-A-CATH;  Surgeon: Marlyce Huge, MD;  Location: [facility] ORS;  Service: General;  Laterality: Right;  . PORTACATH PLACEMENT    . PORTACATH PLACEMENT Left 07/29/2018   Procedure: INSERTION PORT-A-CATH LEFT;  Surgeon: Robert Bellow, MD;  Location: [facility] ORS;  Service: General;  Laterality: Left;    FAMILY HISTORY: Father with prostate cancer.  COPD, CHF.     ADVANCED DIRECTIVES:    HEALTH MAINTENANCE: Social History   Tobacco Use  . Smoking status: Never Smoker  . Smokeless tobacco: Current User    Types: Snuff  . Tobacco comment: might smoke a cigar once every 3 months  Substance Use Topics  . Alcohol use: Yes    Alcohol/week: 0.0 standard drinks    Comment: RARE  . Drug use: No     Colonoscopy:  PAP:  Bone density:  Lipid  panel:  Allergies  Allergen Reactions  . Sulfa Antibiotics Hives    Other reaction(s): Other (qualifier value)    Current Outpatient Medications  Medication Sig Dispense Refill  . albuterol (VENTOLIN HFA) 108 (90 Base) MCG/ACT inhaler Inhale 2 puffs into the lungs every 6 (six) hours as needed for wheezing or  shortness of breath (cough). 1 Inhaler 2  . cetirizine (ZYRTEC) 10 MG tablet Take 10 mg by mouth at bedtime.     . chlorpheniramine-HYDROcodone (TUSSIONEX PENNKINETIC ER) 10-8 MG/5ML SUER Take 5 mLs by mouth every 8 (eight) hours as needed for cough. 200 mL 0  . Dextromethorphan-guaiFENesin (MUCINEX DM MAXIMUM STRENGTH) 60-1200 MG TB12 Take 1 tablet by mouth at bedtime as needed (congestion/drainage.).    [name] gabapentin (NEURONTIN) 100 MG capsule Take 1 capsule once daily before bed x2 days, then take 2 capsules once daily before bed x2 days, then may increase to 3 capsules once daily before bed if needed. (Patient taking differently: Take 200 mg by mouth at bedtime. ) 90 capsule 1  . lidocaine-prilocaine (EMLA) cream Apply to affected area once (Patient taking differently: Apply 1 application topically daily as needed (prior to port being accessed.). ) 30 g 3  . loperamide (IMODIUM A-D) 2 MG capsule Take 2 mg by mouth as needed for diarrhea or loose stools.     . Multiple Vitamin (MULTIVITAMIN WITH MINERALS) TABS tablet Take 1 tablet by mouth at bedtime.    [name] NEEDLE, DISP, 18 G 18G X 1" MISC Use as directed 50 each 0  . ondansetron (ZOFRAN) 8 MG tablet Take 1 tablet (8 mg total) by mouth 2 (two) times daily as needed for refractory nausea / vomiting. 30 tablet 2  . Oxymetazoline HCl (AFRIN EXTRA MOISTURIZING NA) Place 1 spray into the nose 2 (two) times daily as needed (congestion.).    [name] prochlorperazine (COMPAZINE) 10 MG tablet Take 1 tablet (10 mg total) by mouth every 6 (six) hours as needed (Nausea or vomiting). 60 tablet 2  . SYRINGE-NEEDLE, DISP, 3 ML (LUER LOCK SAFETY SYRINGES) 21G X 1-1/2" 3 ML MISC Use as directed for testosterone administration 50 each 0  . tadalafil (ADCIRCA/CIALIS) 20 MG tablet Take 1 tablet (20 mg total) by mouth daily as needed for erectile dysfunction. 6 tablet 10  . testosterone cypionate (DEPOTESTOSTERONE CYPIONATE) 200 MG/ML injection Inject 1 mL (200 mg total) into  the muscle every 14 (fourteen) days. 10 mL 0  . timolol (BETIMOL) 0.5 % ophthalmic solution Place 1 drop into the right eye at bedtime.     [name] zolpidem (AMBIEN) 5 MG tablet Take 1 tablet (5 mg total) by mouth at bedtime as needed for sleep. 30 tablet 0   No current facility-administered medications for this visit.     OBJECTIVE: Vitals:   09/18/18 0926 09/18/18 0927  BP: (!) 140/107   Pulse: (!) 114   Temp: 98.4 F (36.9 C)   SpO2:  99%     Body mass index is 32.12 kg/m.    ECOG FS:0 - Asymptomatic  General: Well-developed, well-nourished, no acute distress. Eyes: Pink conjunctiva, anicteric sclera. HEENT: Normocephalic, moist mucous membranes. Lungs: Clear to auscultation bilaterally. Heart: Regular rate and rhythm. No rubs, murmurs, or gallops. Abdomen: Soft, nontender, nondistended. No organomegaly noted, normoactive bowel sounds.  Colostomy bag noted. Musculoskeletal: No edema, cyanosis, or clubbing. Neuro: Alert, answering all questions appropriately. Cranial nerves grossly intact. Skin: No rashes or petechiae noted. Psych: Normal affect.  LAB RESULTS:  Lab Results  Component Value Date   NA 139 09/18/2018   K 3.5 09/18/2018   CL 108 09/18/2018   CO2 24 09/18/2018   GLUCOSE 136 (H) 09/18/2018   BUN 19 09/18/2018   CREATININE 1.05 09/18/2018   CALCIUM 8.4 (L) 09/18/2018   PROT 6.3 (L) 09/18/2018   ALBUMIN 3.5 09/18/2018   AST 22 09/18/2018   ALT 27 09/18/2018   ALKPHOS 66 09/18/2018   BILITOT 0.7 09/18/2018   GFRNONAA >60 09/18/2018   GFRAA >60 09/18/2018    Lab Results  Component Value Date   WBC 3.7 (L) 09/18/2018   NEUTROABS 2.3 09/18/2018   HGB 14.1 09/18/2018   HCT 42.7 09/18/2018   MCV 89.1 09/18/2018   PLT 223 09/18/2018     STUDIES: No results found.  ASSESSMENT: Recurrent, stage IV rectal adenocarcinoma.  PLAN:    1.  Recurrent, stage IV rectal adenocarcinoma: Patient was initially considered a clinical stage IIIa, but after completing  neoadjuvant 5-FU and XRT followed by surgical excision on October 08, 2013 he was noted to be pathologic stage Ia.  He declined adjuvant FOLFOX at that time.  More recently, patient's CEA started trending up and CT of the chest revealed a large 7.7 cm left upper lobe mass biopsy consistent with rectal adenocarcinoma.  PET scan completed on June 24, 2018 did not reveal any other evidence of disease.  Case was discussed with thoracic surgery and given the size and location of his mass, surgical resection is not possible.  Treatment with XRT was also discussed, but it was agreed upon that patient should pursue FOLFOX plus Avastin as initial treatment and consider XRT later if there is any residual disease.  Proceed with cycle 4 of FOLFOX plus Avastin today.  Return to clinic in 2 days for pump removal and then in 2 weeks for further evaluation and consideration of cycle 5.  Plan to reimage at the conclusion of cycle 6. 2.  Leukopenia: Chronic and unchanged. 3.  Hyphae seen on biopsy: Serum galactomannan antigen is pending. 4.  Peripheral neuropathy: Resolved.  Monitor. 5.  Cough: Continue Tessalon Perles as needed. 6.  Insomnia: Continue Ambien as needed.   Patient expressed understanding and was in agreement with this plan. He also understands that He can call clinic at any time with any questions, concerns, or complaints.    Lloyd Huger, MD   09/19/2018 6:20 AM

## 2018-09-17 ENCOUNTER — Other Ambulatory Visit: Payer: Self-pay

## 2018-09-18 ENCOUNTER — Encounter: Payer: Self-pay | Admitting: Oncology

## 2018-09-18 ENCOUNTER — Inpatient Hospital Stay (HOSPITAL_BASED_OUTPATIENT_CLINIC_OR_DEPARTMENT_OTHER): Payer: 59 | Admitting: Oncology

## 2018-09-18 ENCOUNTER — Inpatient Hospital Stay: Payer: 59

## 2018-09-18 ENCOUNTER — Other Ambulatory Visit: Payer: Self-pay

## 2018-09-18 VITALS — BP 137/82 | HR 94 | Resp 20

## 2018-09-18 VITALS — BP 140/107 | HR 114 | Temp 98.4°F | Ht 75.0 in | Wt 257.0 lb

## 2018-09-18 DIAGNOSIS — C20 Malignant neoplasm of rectum: Secondary | ICD-10-CM

## 2018-09-18 DIAGNOSIS — D72819 Decreased white blood cell count, unspecified: Secondary | ICD-10-CM | POA: Diagnosis not present

## 2018-09-18 DIAGNOSIS — Z95828 Presence of other vascular implants and grafts: Secondary | ICD-10-CM

## 2018-09-18 DIAGNOSIS — Z79899 Other long term (current) drug therapy: Secondary | ICD-10-CM | POA: Diagnosis not present

## 2018-09-18 DIAGNOSIS — G4700 Insomnia, unspecified: Secondary | ICD-10-CM | POA: Diagnosis not present

## 2018-09-18 DIAGNOSIS — Z923 Personal history of irradiation: Secondary | ICD-10-CM | POA: Diagnosis not present

## 2018-09-18 DIAGNOSIS — R05 Cough: Secondary | ICD-10-CM | POA: Diagnosis not present

## 2018-09-18 DIAGNOSIS — Z5111 Encounter for antineoplastic chemotherapy: Secondary | ICD-10-CM | POA: Diagnosis not present

## 2018-09-18 DIAGNOSIS — G629 Polyneuropathy, unspecified: Secondary | ICD-10-CM | POA: Diagnosis not present

## 2018-09-18 LAB — CBC WITH DIFFERENTIAL/PLATELET
Abs Immature Granulocytes: 0.01 10*3/uL (ref 0.00–0.07)
Basophils Absolute: 0.1 10*3/uL (ref 0.0–0.1)
Basophils Relative: 1 %
Eosinophils Absolute: 0.1 10*3/uL (ref 0.0–0.5)
Eosinophils Relative: 4 %
HCT: 42.7 % (ref 39.0–52.0)
Hemoglobin: 14.1 g/dL (ref 13.0–17.0)
Immature Granulocytes: 0 %
Lymphocytes Relative: 19 %
Lymphs Abs: 0.7 10*3/uL (ref 0.7–4.0)
MCH: 29.4 pg (ref 26.0–34.0)
MCHC: 33 g/dL (ref 30.0–36.0)
MCV: 89.1 fL (ref 80.0–100.0)
Monocytes Absolute: 0.5 10*3/uL (ref 0.1–1.0)
Monocytes Relative: 13 %
Neutro Abs: 2.3 10*3/uL (ref 1.7–7.7)
Neutrophils Relative %: 63 %
Platelets: 223 10*3/uL (ref 150–400)
RBC: 4.79 MIL/uL (ref 4.22–5.81)
RDW: 16.6 % — ABNORMAL HIGH (ref 11.5–15.5)
WBC: 3.7 10*3/uL — ABNORMAL LOW (ref 4.0–10.5)
nRBC: 0 % (ref 0.0–0.2)

## 2018-09-18 LAB — COMPREHENSIVE METABOLIC PANEL
ALT: 27 U/L (ref 0–44)
AST: 22 U/L (ref 15–41)
Albumin: 3.5 g/dL (ref 3.5–5.0)
Alkaline Phosphatase: 66 U/L (ref 38–126)
Anion gap: 7 (ref 5–15)
BUN: 19 mg/dL (ref 8–23)
CO2: 24 mmol/L (ref 22–32)
Calcium: 8.4 mg/dL — ABNORMAL LOW (ref 8.9–10.3)
Chloride: 108 mmol/L (ref 98–111)
Creatinine, Ser: 1.05 mg/dL (ref 0.61–1.24)
GFR calc Af Amer: >60 mL/min (ref >60)
GFR calc non Af Amer: >60 mL/min (ref >60)
Glucose, Bld: 136 mg/dL — ABNORMAL HIGH (ref 70–99)
Potassium: 3.5 mmol/L (ref 3.5–5.1)
Sodium: 139 mmol/L (ref 135–145)
Total Bilirubin: 0.7 mg/dL (ref 0.3–1.2)
Total Protein: 6.3 g/dL — ABNORMAL LOW (ref 6.5–8.1)

## 2018-09-18 LAB — PROTEIN, URINE, RANDOM: Total Protein, Urine: 18 mg/dL

## 2018-09-18 MED ORDER — PALONOSETRON HCL INJECTION 0.25 MG/5ML
0.2500 mg | Freq: Once | INTRAVENOUS | Status: AC
  Administered 2018-09-18: 0.25 mg via INTRAVENOUS
  Filled 2018-09-18: qty 5

## 2018-09-18 MED ORDER — SODIUM CHLORIDE 0.9% FLUSH
10.0000 mL | Freq: Once | INTRAVENOUS | Status: AC
  Administered 2018-09-18: 10 mL via INTRAVENOUS
  Filled 2018-09-18: qty 10

## 2018-09-18 MED ORDER — OXALIPLATIN CHEMO INJECTION 100 MG/20ML
85.0000 mg/m2 | Freq: Once | INTRAVENOUS | Status: AC
  Administered 2018-09-18: 220 mg via INTRAVENOUS
  Filled 2018-09-18: qty 40

## 2018-09-18 MED ORDER — LEUCOVORIN CALCIUM INJECTION 350 MG
1000.0000 mg | Freq: Once | INTRAVENOUS | Status: AC
  Administered 2018-09-18: 1000 mg via INTRAVENOUS
  Filled 2018-09-18: qty 50

## 2018-09-18 MED ORDER — SODIUM CHLORIDE 0.9 % IV SOLN
2400.0000 mg/m2 | INTRAVENOUS | Status: DC
  Administered 2018-09-18: 6150 mg via INTRAVENOUS
  Filled 2018-09-18: qty 100

## 2018-09-18 MED ORDER — SODIUM CHLORIDE 0.9 % IV SOLN
Freq: Once | INTRAVENOUS | Status: AC
  Administered 2018-09-18: 10:00:00 via INTRAVENOUS
  Filled 2018-09-18: qty 250

## 2018-09-18 MED ORDER — DEXAMETHASONE SODIUM PHOSPHATE 10 MG/ML IJ SOLN
10.0000 mg | Freq: Once | INTRAMUSCULAR | Status: AC
  Administered 2018-09-18: 10 mg via INTRAVENOUS
  Filled 2018-09-18: qty 1

## 2018-09-18 MED ORDER — DEXTROSE 5 % IV SOLN
Freq: Once | INTRAVENOUS | Status: AC
  Administered 2018-09-18: 11:00:00 via INTRAVENOUS
  Filled 2018-09-18: qty 250

## 2018-09-18 MED ORDER — FLUOROURACIL CHEMO INJECTION 2.5 GM/50ML
400.0000 mg/m2 | Freq: Once | INTRAVENOUS | Status: AC
  Administered 2018-09-18: 14:00:00 1000 mg via INTRAVENOUS
  Filled 2018-09-18: qty 20

## 2018-09-18 MED ORDER — SODIUM CHLORIDE 0.9 % IV SOLN
600.0000 mg | Freq: Once | INTRAVENOUS | Status: AC
  Administered 2018-09-18: 11:00:00 600 mg via INTRAVENOUS
  Filled 2018-09-18: qty 16

## 2018-09-18 NOTE — Progress Notes (Signed)
Patient stated that he had been doing well.

## 2018-09-19 ENCOUNTER — Other Ambulatory Visit: Payer: Self-pay

## 2018-09-19 ENCOUNTER — Other Ambulatory Visit: Payer: Self-pay | Admitting: *Deleted

## 2018-09-19 ENCOUNTER — Encounter: Payer: Self-pay | Admitting: Oncology

## 2018-09-19 DIAGNOSIS — R059 Cough, unspecified: Secondary | ICD-10-CM

## 2018-09-19 DIAGNOSIS — R05 Cough: Secondary | ICD-10-CM

## 2018-09-19 MED ORDER — HYDROCOD POLST-CPM POLST ER 10-8 MG/5ML PO SUER: 5.0000 mL | Freq: Three times a day (TID) | ORAL | 0 refills | Status: DC | PRN

## 2018-09-20 ENCOUNTER — Inpatient Hospital Stay: Payer: 59

## 2018-09-20 ENCOUNTER — Other Ambulatory Visit: Payer: Self-pay

## 2018-09-20 ENCOUNTER — Ambulatory Visit: Payer: 59 | Admitting: Family Medicine

## 2018-09-20 DIAGNOSIS — G4700 Insomnia, unspecified: Secondary | ICD-10-CM | POA: Diagnosis not present

## 2018-09-20 DIAGNOSIS — C20 Malignant neoplasm of rectum: Secondary | ICD-10-CM | POA: Diagnosis not present

## 2018-09-20 DIAGNOSIS — Z79899 Other long term (current) drug therapy: Secondary | ICD-10-CM | POA: Diagnosis not present

## 2018-09-20 DIAGNOSIS — D72819 Decreased white blood cell count, unspecified: Secondary | ICD-10-CM | POA: Diagnosis not present

## 2018-09-20 DIAGNOSIS — Z923 Personal history of irradiation: Secondary | ICD-10-CM | POA: Diagnosis not present

## 2018-09-20 DIAGNOSIS — R05 Cough: Secondary | ICD-10-CM | POA: Diagnosis not present

## 2018-09-20 DIAGNOSIS — G629 Polyneuropathy, unspecified: Secondary | ICD-10-CM | POA: Diagnosis not present

## 2018-09-20 DIAGNOSIS — Z5111 Encounter for antineoplastic chemotherapy: Secondary | ICD-10-CM | POA: Diagnosis not present

## 2018-09-20 MED ORDER — HEPARIN SOD (PORK) LOCK FLUSH 100 UNIT/ML IV SOLN
INTRAVENOUS | Status: AC
  Filled 2018-09-20: qty 5

## 2018-09-20 MED ORDER — SODIUM CHLORIDE 0.9% FLUSH
10.0000 mL | INTRAVENOUS | Status: DC | PRN
  Administered 2018-09-20: 10 mL
  Filled 2018-09-20: qty 10

## 2018-09-20 MED ORDER — HEPARIN SOD (PORK) LOCK FLUSH 100 UNIT/ML IV SOLN
500.0000 [IU] | Freq: Once | INTRAVENOUS | Status: AC | PRN
  Administered 2018-09-20: 13:00:00 500 [IU]

## 2018-09-24 ENCOUNTER — Other Ambulatory Visit: Payer: Self-pay

## 2018-09-24 DIAGNOSIS — R059 Cough, unspecified: Secondary | ICD-10-CM

## 2018-09-24 DIAGNOSIS — R05 Cough: Secondary | ICD-10-CM

## 2018-09-24 MED ORDER — HYDROCOD POLST-CPM POLST ER 10-8 MG/5ML PO SUER: 5.0000 mL | Freq: Three times a day (TID) | ORAL | 0 refills | Status: DC | PRN

## 2018-09-24 NOTE — Progress Notes (Signed)
error 

## 2018-09-24 NOTE — Telephone Encounter (Signed)
The allowed quantity is 159ml for 12 day supply.  New rx submitted for MD to sign with allowed quantity.

## 2018-09-24 NOTE — Telephone Encounter (Signed)
P/A submitted for Tussionex quantity 150 ml via cover my meds web site.

## 2018-09-25 MED FILL — TIMOLOL 0.5% EYE DROPS: 0.5 | 90 days supply | Qty: 5 | Fill #1

## 2018-09-26 MED FILL — TADALAFIL 20 MG TABS: 20 | 30 days supply | Qty: 6 | Fill #3

## 2018-09-27 NOTE — Progress Notes (Signed)
[place]  Telephone:(336) [phone] Fax:(336) [phone]  ID: [name] [name] OB: 09-Feb-1955  MR#: [id number]  XNA#:[id number]  Patient Care Team: Hubbard Hartshorn, FNP as PCP - General (Family Medicine) Marlyce Huge, MD as Attending Physician (Surgery) Lloyd Huger, MD as Consulting Physician (Oncology) Abbie Sons, MD (Urology) Eulogio Bear, MD as Consulting Physician (Ophthalmology) Flora Lipps, MD as Consulting Physician (Pulmonary Disease)   CHIEF COMPLAINT: Recurrent, stage IV rectal adenocarcinoma.  INTERVAL HISTORY: Patient returns to clinic today for further evaluation and consideration of cycle 5 of FOLFOX plus Avastin.  He continues to have a mild peripheral neuropathy for several days after treatment but does not affect his day-to-day activity.  He currently feels well and is asymptomatic.  He has no other neurologic complaints.  He denies any recent fevers or illnesses.  He denies any chest pain, shortness of breath, or hemoptysis.  He denies nausea, vomiting, constipation, or diarrhea. He has noted no blood or melena in his colostomy bag.  He has no urinary complaints.  Patient offers no specific complaints today.  REVIEW OF SYSTEMS:   Review of Systems  Constitutional: Negative.  Negative for fever, malaise/fatigue and weight loss.  Respiratory: Positive for cough. Negative for shortness of breath.   Cardiovascular: Negative.  Negative for chest pain and leg swelling.  Gastrointestinal: Negative.  Negative for abdominal pain, blood in stool, constipation, diarrhea and melena.  Genitourinary: Negative.  Negative for dysuria.  Musculoskeletal: Negative.  Negative for back pain.  Skin: Negative.  Negative for rash.  Neurological: Negative.  Negative for tingling, sensory change, focal weakness, weakness and headaches.  Psychiatric/Behavioral: The patient is not nervous/anxious and does not have insomnia.     As per HPI.  Otherwise, a complete review of systems is negative.  PAST MEDICAL HISTORY: Past Medical History:  Diagnosis Date  . Anemia   . Cancer [facility])    rectal 2015, followed by Oncology, permanent colostomy  . Colostomy in place [facility])   . History of kidney stones   . Seasonal allergies     PAST SURGICAL HISTORY: Past Surgical History:  Procedure Laterality Date  . BACK SURGERY    . CHOLECYSTECTOMY    . COLON SURGERY    . ENDOBRONCHIAL ULTRASOUND N/A 07/01/2018   Procedure: ENDOBRONCHIAL ULTRASOUND;  Surgeon: Tyler Pita, MD;  Location: [facility] ORS;  Service: Cardiopulmonary;  Laterality: N/A;  . KNEE SURGERY Bilateral   . PORT A CATH REVISION Left 08/05/2018   Procedure: PORT A CATH REVISION, REPOSITION LEFT;  Surgeon: Robert Bellow, MD;  Location: [facility] ORS;  Service: General;  Laterality: Left;  . PORT-A-CATH REMOVAL Right 12/02/2014   Procedure: REMOVAL PORT-A-CATH;  Surgeon: Marlyce Huge, MD;  Location: [facility] ORS;  Service: General;  Laterality: Right;  . PORTACATH PLACEMENT    . PORTACATH PLACEMENT Left 07/29/2018   Procedure: INSERTION PORT-A-CATH LEFT;  Surgeon: Robert Bellow, MD;  Location: [facility] ORS;  Service: General;  Laterality: Left;    FAMILY HISTORY: Father with prostate cancer.  COPD, CHF.     ADVANCED DIRECTIVES:    HEALTH MAINTENANCE: Social History   Tobacco Use  . Smoking status: Never Smoker  . Smokeless tobacco: Current User    Types: Snuff  . Tobacco comment: might smoke a cigar once every 3 months  Substance Use Topics  . Alcohol use: Yes    Alcohol/week: 0.0 standard drinks    Comment: RARE  . Drug use: No     Colonoscopy:  PAP:  Bone density:  Lipid panel:  Allergies  Allergen Reactions  . Sulfa Antibiotics Hives    Other reaction(s): Other (qualifier value)    Current Outpatient Medications  Medication Sig Dispense Refill  . albuterol (VENTOLIN HFA) 108 (90 Base) MCG/ACT inhaler Inhale 2 puffs into the lungs every 6  (six) hours as needed for wheezing or shortness of breath (cough). 1 Inhaler 2  . cetirizine (ZYRTEC) 10 MG tablet Take 10 mg by mouth at bedtime.     . chlorpheniramine-HYDROcodone (TUSSIONEX PENNKINETIC ER) 10-8 MG/5ML SUER Take 5 mLs by mouth every 8 (eight) hours as needed for cough. 115 mL 0  . Dextromethorphan-guaiFENesin (MUCINEX DM MAXIMUM STRENGTH) 60-1200 MG TB12 Take 1 tablet by mouth at bedtime as needed (congestion/drainage.).    [name] gabapentin (NEURONTIN) 100 MG capsule Take 1 capsule once daily before bed x2 days, then take 2 capsules once daily before bed x2 days, then may increase to 3 capsules once daily before bed if needed. (Patient taking differently: Take 200 mg by mouth at bedtime. ) 90 capsule 1  . lidocaine-prilocaine (EMLA) cream Apply to affected area once (Patient taking differently: Apply 1 application topically daily as needed (prior to port being accessed.). ) 30 g 3  . loperamide (IMODIUM A-D) 2 MG capsule Take 2 mg by mouth as needed for diarrhea or loose stools.     . Multiple Vitamin (MULTIVITAMIN WITH MINERALS) TABS tablet Take 1 tablet by mouth at bedtime.    [name] NEEDLE, DISP, 18 G 18G X 1" MISC Use as directed 50 each 0  . ondansetron (ZOFRAN) 8 MG tablet Take 1 tablet (8 mg total) by mouth 2 (two) times daily as needed for refractory nausea / vomiting. 30 tablet 2  . Oxymetazoline HCl (AFRIN EXTRA MOISTURIZING NA) Place 1 spray into the nose 2 (two) times daily as needed (congestion.).    [name] prochlorperazine (COMPAZINE) 10 MG tablet Take 1 tablet (10 mg total) by mouth every 6 (six) hours as needed (Nausea or vomiting). 60 tablet 2  . SYRINGE-NEEDLE, DISP, 3 ML (LUER LOCK SAFETY SYRINGES) 21G X 1-1/2" 3 ML MISC Use as directed for testosterone administration 50 each 0  . tadalafil (ADCIRCA/CIALIS) 20 MG tablet Take 1 tablet (20 mg total) by mouth daily as needed for erectile dysfunction. 6 tablet 10  . testosterone cypionate (DEPOTESTOSTERONE CYPIONATE) 200 MG/ML  injection Inject 1 mL (200 mg total) into the muscle every 14 (fourteen) days. 10 mL 0  . timolol (BETIMOL) 0.5 % ophthalmic solution Place 1 drop into the right eye at bedtime.     [name] zolpidem (AMBIEN) 5 MG tablet Take 1 tablet (5 mg total) by mouth at bedtime as needed for sleep. 30 tablet 0   No current facility-administered medications for this visit.    Facility-Administered Medications Ordered in Other Visits  Medication Dose Route Frequency Provider Last Rate Last Dose  . dexamethasone (DECADRON) injection 10 mg  10 mg Intravenous Once Lloyd Huger, MD      . dextrose 5 % solution   Intravenous Once Lloyd Huger, MD      . fluorouracil (ADRUCIL) 6,150 mg in sodium chloride 0.9 % 127 mL chemo infusion  2,400 mg/m2 (Order-Specific) Intravenous 1 day or 1 dose Lloyd Huger, MD      . fluorouracil (ADRUCIL) chemo injection 1,000 mg  400 mg/m2 (Order-Specific) Intravenous Once Lloyd Huger, MD      . leucovorin 1,024 mg in dextrose 5 % 250  mL infusion  400 mg/m2 (Order-Specific) Intravenous Once Lloyd Huger, MD      . oxaliplatin (ELOXATIN) 220 mg in dextrose 5 % 500 mL chemo infusion  85 mg/m2 (Order-Specific) Intravenous Once Lloyd Huger, MD      . palonosetron (ALOXI) injection 0.25 mg  0.25 mg Intravenous Once Lloyd Huger, MD        OBJECTIVE: Vitals:   10/02/18 0846  BP: (!) 146/83  Pulse: (!) 108  Resp: 18  Temp: 98.8 F (37.1 C)     Body mass index is 32.96 kg/m.    ECOG FS:0 - Asymptomatic  General: Well-developed, well-nourished, no acute distress. Eyes: Pink conjunctiva, anicteric sclera. HEENT: Normocephalic, moist mucous membranes. Lungs: Clear to auscultation bilaterally. Heart: Regular rate and rhythm. No rubs, murmurs, or gallops. Abdomen: Soft, nontender, nondistended. No organomegaly noted, normoactive bowel sounds.  Colostomy bag noted.   Musculoskeletal: No edema, cyanosis, or clubbing. Neuro: Alert, answering  all questions appropriately. Cranial nerves grossly intact. Skin: No rashes or petechiae noted. Psych: Normal affect.  LAB RESULTS:  Lab Results  Component Value Date   NA 140 10/02/2018   K 3.5 10/02/2018   CL 107 10/02/2018   CO2 23 10/02/2018   GLUCOSE 143 (H) 10/02/2018   BUN 18 10/02/2018   CREATININE 1.05 10/02/2018   CALCIUM 8.9 10/02/2018   PROT 6.2 (L) 10/02/2018   ALBUMIN 3.8 10/02/2018   AST 21 10/02/2018   ALT 18 10/02/2018   ALKPHOS 59 10/02/2018   BILITOT 0.7 10/02/2018   GFRNONAA >60 10/02/2018   GFRAA >60 10/02/2018    Lab Results  Component Value Date   WBC 4.0 10/02/2018   NEUTROABS 2.6 10/02/2018   HGB 14.0 10/02/2018   HCT 42.6 10/02/2018   MCV 90.3 10/02/2018   PLT 226 10/02/2018     STUDIES: No results found.  ASSESSMENT: Recurrent, stage IV rectal adenocarcinoma.  PLAN:    1.  Recurrent, stage IV rectal adenocarcinoma: Patient was initially considered a clinical stage IIIa, but after completing neoadjuvant 5-FU and XRT followed by surgical excision on October 08, 2013 he was noted to be pathologic stage Ia.  He declined adjuvant FOLFOX at that time.  More recently, patient's CEA started trending up and CT of the chest revealed a large 7.7 cm left upper lobe mass biopsy consistent with rectal adenocarcinoma.  PET scan completed on June 24, 2018 did not reveal any other evidence of disease.  Case was discussed with thoracic surgery and given the size and location of his mass, surgical resection is not possible.  Treatment with XRT was also discussed, but it was agreed upon that patient should pursue FOLFOX plus Avastin as initial treatment and consider XRT later if there is any residual disease.  Proceed with cycle 5 of FOLFOX plus Avastin today.  Return to clinic in 2 days for pump removal and then in 2 weeks for further evaluation and consideration of cycle 6.  Plan to reimage at the conclusion of cycle 6.   2.  Leukopenia: Resolved. 3.  Hyphae  seen on biopsy: Serum galactomannan antigen is pending. 4.  Peripheral neuropathy: Resolved.  Monitor. 5.  Cough: Chronic and unchanged.  Continue Tessalon Perles as needed. 6.  Insomnia: Patient does not complain of this today.  Continue Ambien as needed.  I spent a total of 30 minutes face-to-face with the patient of which greater than 50% of the visit was spent in counseling and coordination of care as detailed above.  Patient expressed understanding and was in agreement with this plan. He also understands that He can call clinic at any time with any questions, concerns, or complaints.    Lloyd Huger, MD   10/02/2018 9:44 AM

## 2018-10-01 ENCOUNTER — Other Ambulatory Visit: Payer: Self-pay

## 2018-10-01 ENCOUNTER — Encounter: Payer: Self-pay | Admitting: Oncology

## 2018-10-01 NOTE — Progress Notes (Signed)
Patient states no changes since last appointment

## 2018-10-02 ENCOUNTER — Other Ambulatory Visit: Payer: Self-pay

## 2018-10-02 ENCOUNTER — Inpatient Hospital Stay: Payer: 59 | Attending: Oncology | Admitting: Oncology

## 2018-10-02 ENCOUNTER — Inpatient Hospital Stay: Payer: 59

## 2018-10-02 VITALS — BP 146/83 | HR 108 | Temp 98.8°F | Resp 18 | Wt 263.7 lb

## 2018-10-02 DIAGNOSIS — Z5112 Encounter for antineoplastic immunotherapy: Secondary | ICD-10-CM | POA: Insufficient documentation

## 2018-10-02 DIAGNOSIS — Z79899 Other long term (current) drug therapy: Secondary | ICD-10-CM | POA: Diagnosis not present

## 2018-10-02 DIAGNOSIS — Z933 Colostomy status: Secondary | ICD-10-CM | POA: Diagnosis not present

## 2018-10-02 DIAGNOSIS — C20 Malignant neoplasm of rectum: Secondary | ICD-10-CM

## 2018-10-02 DIAGNOSIS — R05 Cough: Secondary | ICD-10-CM | POA: Diagnosis not present

## 2018-10-02 DIAGNOSIS — Z5111 Encounter for antineoplastic chemotherapy: Secondary | ICD-10-CM | POA: Diagnosis not present

## 2018-10-02 DIAGNOSIS — G4700 Insomnia, unspecified: Secondary | ICD-10-CM | POA: Diagnosis not present

## 2018-10-02 DIAGNOSIS — D72819 Decreased white blood cell count, unspecified: Secondary | ICD-10-CM | POA: Insufficient documentation

## 2018-10-02 LAB — PSA: Prostatic Specific Antigen: 5.72 ng/mL — ABNORMAL HIGH (ref 0.00–4.00)

## 2018-10-02 LAB — COMPREHENSIVE METABOLIC PANEL
ALT: 18 U/L (ref 0–44)
AST: 21 U/L (ref 15–41)
Albumin: 3.8 g/dL (ref 3.5–5.0)
Alkaline Phosphatase: 59 U/L (ref 38–126)
Anion gap: 10 (ref 5–15)
BUN: 18 mg/dL (ref 8–23)
CO2: 23 mmol/L (ref 22–32)
Calcium: 8.9 mg/dL (ref 8.9–10.3)
Chloride: 107 mmol/L (ref 98–111)
Creatinine, Ser: 1.05 mg/dL (ref 0.61–1.24)
GFR calc Af Amer: >60 mL/min (ref >60)
GFR calc non Af Amer: >60 mL/min (ref >60)
Glucose, Bld: 143 mg/dL — ABNORMAL HIGH (ref 70–99)
Potassium: 3.5 mmol/L (ref 3.5–5.1)
Sodium: 140 mmol/L (ref 135–145)
Total Bilirubin: 0.7 mg/dL (ref 0.3–1.2)
Total Protein: 6.2 g/dL — ABNORMAL LOW (ref 6.5–8.1)

## 2018-10-02 LAB — CBC WITH DIFFERENTIAL/PLATELET
Abs Immature Granulocytes: 0.02 10*3/uL (ref 0.00–0.07)
Basophils Absolute: 0.1 10*3/uL (ref 0.0–0.1)
Basophils Relative: 1 %
Eosinophils Absolute: 0.3 10*3/uL (ref 0.0–0.5)
Eosinophils Relative: 8 %
HCT: 42.6 % (ref 39.0–52.0)
Hemoglobin: 14 g/dL (ref 13.0–17.0)
Immature Granulocytes: 1 %
Lymphocytes Relative: 19 %
Lymphs Abs: 0.8 10*3/uL (ref 0.7–4.0)
MCH: 29.7 pg (ref 26.0–34.0)
MCHC: 32.9 g/dL (ref 30.0–36.0)
MCV: 90.3 fL (ref 80.0–100.0)
Monocytes Absolute: 0.3 10*3/uL (ref 0.1–1.0)
Monocytes Relative: 8 %
Neutro Abs: 2.6 10*3/uL (ref 1.7–7.7)
Neutrophils Relative %: 63 %
Platelets: 226 10*3/uL (ref 150–400)
RBC: 4.72 MIL/uL (ref 4.22–5.81)
RDW: 17.6 % — ABNORMAL HIGH (ref 11.5–15.5)
WBC: 4 10*3/uL (ref 4.0–10.5)
nRBC: 0 % (ref 0.0–0.2)

## 2018-10-02 LAB — PROTEIN, URINE, RANDOM: Total Protein, Urine: 8 mg/dL

## 2018-10-02 MED ORDER — SODIUM CHLORIDE 0.9 % IV SOLN
2400.0000 mg/m2 | INTRAVENOUS | Status: DC
  Administered 2018-10-02: 14:00:00 6150 mg via INTRAVENOUS
  Filled 2018-10-02: qty 100

## 2018-10-02 MED ORDER — OXALIPLATIN CHEMO INJECTION 100 MG/20ML
85.0000 mg/m2 | Freq: Once | INTRAVENOUS | Status: AC
  Administered 2018-10-02: 12:00:00 220 mg via INTRAVENOUS
  Filled 2018-10-02: qty 40

## 2018-10-02 MED ORDER — DEXTROSE 5 % IV SOLN
Freq: Once | INTRAVENOUS | Status: AC
  Administered 2018-10-02: 11:00:00 via INTRAVENOUS
  Filled 2018-10-02: qty 250

## 2018-10-02 MED ORDER — LEUCOVORIN CALCIUM INJECTION 350 MG
1000.0000 mg | Freq: Once | INTRAVENOUS | Status: AC
  Administered 2018-10-02: 12:00:00 1000 mg via INTRAVENOUS
  Filled 2018-10-02: qty 50

## 2018-10-02 MED ORDER — PALONOSETRON HCL INJECTION 0.25 MG/5ML
0.2500 mg | Freq: Once | INTRAVENOUS | Status: AC
  Administered 2018-10-02: 10:00:00 0.25 mg via INTRAVENOUS
  Filled 2018-10-02: qty 5

## 2018-10-02 MED ORDER — SODIUM CHLORIDE 0.9 % IV SOLN
INTRAVENOUS | Status: DC
  Administered 2018-10-02: 10:00:00 via INTRAVENOUS
  Filled 2018-10-02: qty 250

## 2018-10-02 MED ORDER — SODIUM CHLORIDE 0.9 % IV SOLN
4.8000 mg/kg | Freq: Once | INTRAVENOUS | Status: AC
  Administered 2018-10-02: 11:00:00 600 mg via INTRAVENOUS
  Filled 2018-10-02: qty 16

## 2018-10-02 MED ORDER — SODIUM CHLORIDE 0.9% FLUSH
10.0000 mL | Freq: Once | INTRAVENOUS | Status: AC
  Administered 2018-10-02: 10 mL via INTRAVENOUS
  Filled 2018-10-02: qty 10

## 2018-10-02 MED ORDER — FLUOROURACIL CHEMO INJECTION 2.5 GM/50ML
400.0000 mg/m2 | Freq: Once | INTRAVENOUS | Status: AC
  Administered 2018-10-02: 14:00:00 1000 mg via INTRAVENOUS
  Filled 2018-10-02: qty 20

## 2018-10-02 MED ORDER — DEXAMETHASONE SODIUM PHOSPHATE 10 MG/ML IJ SOLN
10.0000 mg | Freq: Once | INTRAMUSCULAR | Status: AC
  Administered 2018-10-02: 10 mg via INTRAVENOUS
  Filled 2018-10-02: qty 1

## 2018-10-02 NOTE — Progress Notes (Signed)
Pt has cough but it is not new but he states with the humdity it is harder to breathe

## 2018-10-02 NOTE — Progress Notes (Signed)
Proceed with treatment today per Dr. Grayland Ormond

## 2018-10-03 LAB — TESTOSTERONE: Testosterone: 503 ng/dL (ref 264–916)

## 2018-10-03 LAB — CEA: CEA: 5 ng/mL — ABNORMAL HIGH (ref 0.0–4.7)

## 2018-10-04 ENCOUNTER — Inpatient Hospital Stay: Payer: 59

## 2018-10-04 ENCOUNTER — Encounter: Payer: Self-pay | Admitting: Oncology

## 2018-10-04 ENCOUNTER — Other Ambulatory Visit: Payer: Self-pay | Admitting: *Deleted

## 2018-10-04 ENCOUNTER — Other Ambulatory Visit: Payer: Self-pay

## 2018-10-04 DIAGNOSIS — C20 Malignant neoplasm of rectum: Secondary | ICD-10-CM

## 2018-10-04 DIAGNOSIS — Z5112 Encounter for antineoplastic immunotherapy: Secondary | ICD-10-CM | POA: Diagnosis not present

## 2018-10-04 DIAGNOSIS — R05 Cough: Secondary | ICD-10-CM

## 2018-10-04 DIAGNOSIS — Z79899 Other long term (current) drug therapy: Secondary | ICD-10-CM | POA: Diagnosis not present

## 2018-10-04 DIAGNOSIS — R059 Cough, unspecified: Secondary | ICD-10-CM

## 2018-10-04 DIAGNOSIS — D72819 Decreased white blood cell count, unspecified: Secondary | ICD-10-CM | POA: Diagnosis not present

## 2018-10-04 DIAGNOSIS — Z933 Colostomy status: Secondary | ICD-10-CM | POA: Diagnosis not present

## 2018-10-04 DIAGNOSIS — Z5111 Encounter for antineoplastic chemotherapy: Secondary | ICD-10-CM | POA: Diagnosis not present

## 2018-10-04 DIAGNOSIS — G4700 Insomnia, unspecified: Secondary | ICD-10-CM | POA: Diagnosis not present

## 2018-10-04 MED ORDER — SODIUM CHLORIDE 0.9% FLUSH
10.0000 mL | INTRAVENOUS | Status: DC | PRN
  Administered 2018-10-04: 10 mL
  Filled 2018-10-04: qty 10

## 2018-10-04 MED ORDER — HEPARIN SOD (PORK) LOCK FLUSH 100 UNIT/ML IV SOLN
INTRAVENOUS | Status: AC
  Filled 2018-10-04: qty 5

## 2018-10-04 MED ORDER — HEPARIN SOD (PORK) LOCK FLUSH 100 UNIT/ML IV SOLN
500.0000 [IU] | Freq: Once | INTRAVENOUS | Status: AC | PRN
  Administered 2018-10-04: 500 [IU]

## 2018-10-04 MED ORDER — HYDROCOD POLST-CPM POLST ER 10-8 MG/5ML PO SUER: 5.0000 mL | Freq: Three times a day (TID) | ORAL | 0 refills | Status: DC | PRN

## 2018-10-04 NOTE — Telephone Encounter (Signed)
Patient sent MyChart message requesting refill.  Last refill on 09/24/18.

## 2018-10-08 DIAGNOSIS — C20 Malignant neoplasm of rectum: Secondary | ICD-10-CM | POA: Diagnosis not present

## 2018-10-10 NOTE — Progress Notes (Signed)
[place]  [phone] Fax:([phone]) [phone]  ID: [name] [name] OB: 30-Nov-1955  MR#: [id number]  ZJI#:[id number]  Patient Care Team: Hubbard Hartshorn, FNP as PCP - General (Family Medicine) Marlyce Huge, MD as Attending Physician (Surgery) Lloyd Huger, MD as Consulting Physician (Oncology) Abbie Sons, MD (Urology) Eulogio Bear, MD as Consulting Physician (Ophthalmology) Flora Lipps, MD as Consulting Physician (Pulmonary Disease)   CHIEF COMPLAINT: Recurrent, stage IV rectal adenocarcinoma.  INTERVAL HISTORY: Patient returns to clinic today for further evaluation and consideration of cycle 6 of FOLFOX plus Avastin.  He continues to have a mild peripheral neuropathy that lasted several days after treatment, but then resolves.  This does not affect his day-to-day activity.  He otherwise feels well and is asymptomatic. He has no other neurologic complaints.  He denies any recent fevers or illnesses.  He denies any chest pain, shortness of breath, or hemoptysis.  He denies nausea, vomiting, constipation, or diarrhea. He has noted no blood or melena in his colostomy bag.  He has no urinary complaints.  Patient offers no specific complaints today.    REVIEW OF SYSTEMS:   Review of Systems  Constitutional: Negative.  Negative for fever, malaise/fatigue and weight loss.  Respiratory: Positive for cough. Negative for shortness of breath.   Cardiovascular: Negative.  Negative for chest pain and leg swelling.  Gastrointestinal: Negative.  Negative for abdominal pain, blood in stool, constipation, diarrhea and melena.  Genitourinary: Negative.  Negative for dysuria.  Musculoskeletal: Negative.  Negative for back pain.  Skin: Negative.  Negative for rash.  Neurological: Negative.  Negative for tingling, sensory change, focal weakness, weakness and headaches.  Psychiatric/Behavioral: The patient is not nervous/anxious and does not have  insomnia.     As per HPI. Otherwise, a complete review of systems is negative.  PAST MEDICAL HISTORY: Past Medical History:  Diagnosis Date  . Anemia   . Cancer [facility])    rectal 2015, followed by Oncology, permanent colostomy  . Colostomy in place [facility])   . History of kidney stones   . Seasonal allergies     PAST SURGICAL HISTORY: Past Surgical History:  Procedure Laterality Date  . BACK SURGERY    . CHOLECYSTECTOMY    . COLON SURGERY    . ENDOBRONCHIAL ULTRASOUND N/A 07/01/2018   Procedure: ENDOBRONCHIAL ULTRASOUND;  Surgeon: Tyler Pita, MD;  Location: [facility] ORS;  Service: Cardiopulmonary;  Laterality: N/A;  . KNEE SURGERY Bilateral   . PORT A CATH REVISION Left 08/05/2018   Procedure: PORT A CATH REVISION, REPOSITION LEFT;  Surgeon: Robert Bellow, MD;  Location: [facility] ORS;  Service: General;  Laterality: Left;  . PORT-A-CATH REMOVAL Right 12/02/2014   Procedure: REMOVAL PORT-A-CATH;  Surgeon: Marlyce Huge, MD;  Location: [facility] ORS;  Service: General;  Laterality: Right;  . PORTACATH PLACEMENT    . PORTACATH PLACEMENT Left 07/29/2018   Procedure: INSERTION PORT-A-CATH LEFT;  Surgeon: Robert Bellow, MD;  Location: [facility] ORS;  Service: General;  Laterality: Left;    FAMILY HISTORY: Father with prostate cancer.  COPD, CHF.     ADVANCED DIRECTIVES:    HEALTH MAINTENANCE: Social History   Tobacco Use  . Smoking status: Never Smoker  . Smokeless tobacco: Current User    Types: Snuff  . Tobacco comment: might smoke a cigar once every 3 months  Substance Use Topics  . Alcohol use: Yes    Alcohol/week: 0.0 standard drinks    Comment: RARE  . Drug use:  No     Colonoscopy:  PAP:  Bone density:  Lipid panel:  Allergies  Allergen Reactions  . Sulfa Antibiotics Hives    Other reaction(s): Other (qualifier value)    Current Outpatient Medications  Medication Sig Dispense Refill  . albuterol (VENTOLIN HFA) 108 (90 Base) MCG/ACT inhaler Inhale 2  puffs into the lungs every 6 (six) hours as needed for wheezing or shortness of breath (cough). 1 Inhaler 2  . cetirizine (ZYRTEC) 10 MG tablet Take 10 mg by mouth at bedtime.     . chlorpheniramine-HYDROcodone (TUSSIONEX PENNKINETIC ER) 10-8 MG/5ML SUER Take 5 mLs by mouth every 8 (eight) hours as needed for cough. 115 mL 0  . Dextromethorphan-guaiFENesin (MUCINEX DM MAXIMUM STRENGTH) 60-1200 MG TB12 Take 1 tablet by mouth at bedtime as needed (congestion/drainage.).    [name] gabapentin (NEURONTIN) 100 MG capsule Take 1 capsule once daily before bed x2 days, then take 2 capsules once daily before bed x2 days, then may increase to 3 capsules once daily before bed if needed. (Patient taking differently: Take 200 mg by mouth at bedtime. ) 90 capsule 1  . lidocaine-prilocaine (EMLA) cream Apply to affected area once (Patient taking differently: Apply 1 application topically daily as needed (prior to port being accessed.). ) 30 g 3  . loperamide (IMODIUM A-D) 2 MG capsule Take 2 mg by mouth as needed for diarrhea or loose stools.     . Multiple Vitamin (MULTIVITAMIN WITH MINERALS) TABS tablet Take 1 tablet by mouth at bedtime.    [name] NEEDLE, DISP, 18 G 18G X 1" MISC Use as directed 50 each 0  . ondansetron (ZOFRAN) 8 MG tablet Take 1 tablet (8 mg total) by mouth 2 (two) times daily as needed for refractory nausea / vomiting. 30 tablet 2  . Oxymetazoline HCl (AFRIN EXTRA MOISTURIZING NA) Place 1 spray into the nose 2 (two) times daily as needed (congestion.).    [name] prochlorperazine (COMPAZINE) 10 MG tablet Take 1 tablet (10 mg total) by mouth every 6 (six) hours as needed (Nausea or vomiting). 60 tablet 2  . SYRINGE-NEEDLE, DISP, 3 ML (LUER LOCK SAFETY SYRINGES) 21G X 1-1/2" 3 ML MISC Use as directed for testosterone administration 50 each 0  . tadalafil (ADCIRCA/CIALIS) 20 MG tablet Take 1 tablet (20 mg total) by mouth daily as needed for erectile dysfunction. 6 tablet 10  . testosterone cypionate  (DEPOTESTOSTERONE CYPIONATE) 200 MG/ML injection Inject 1 mL (200 mg total) into the muscle every 14 (fourteen) days. 10 mL 0  . timolol (BETIMOL) 0.5 % ophthalmic solution Place 1 drop into the right eye at bedtime.     . timolol (TIMOPTIC) 0.5 % ophthalmic solution Place 1 drop into both eyes daily.    [name] zolpidem (AMBIEN) 5 MG tablet Take 1 tablet (5 mg total) by mouth at bedtime as needed for sleep. 30 tablet 0   No current facility-administered medications for this visit.     OBJECTIVE: Vitals:   10/16/18 0924  BP: (!) 131/94  Pulse: 93  Temp: 98.6 F (37 C)     Body mass index is 32.87 kg/m.    ECOG FS:0 - Asymptomatic  General: Well-developed, well-nourished, no acute distress. Eyes: Pink conjunctiva, anicteric sclera. HEENT: Normocephalic, moist mucous membranes. Lungs: Clear to auscultation bilaterally. Heart: Regular rate and rhythm. No rubs, murmurs, or gallops. Abdomen: Soft, nontender, nondistended. No organomegaly noted, normoactive bowel sounds.  Colostomy noted. Musculoskeletal: No edema, cyanosis, or clubbing. Neuro: Alert, answering all questions appropriately. Cranial  nerves grossly intact. Skin: No rashes or petechiae noted. Psych: Normal affect.  LAB RESULTS:  Lab Results  Component Value Date   NA 137 10/16/2018   K 4.1 10/16/2018   CL 104 10/16/2018   CO2 24 10/16/2018   GLUCOSE 122 (H) 10/16/2018   BUN 23 10/16/2018   CREATININE 1.05 10/16/2018   CALCIUM 9.0 10/16/2018   PROT 6.6 10/16/2018   ALBUMIN 4.0 10/16/2018   AST 31 10/16/2018   ALT 30 10/16/2018   ALKPHOS 72 10/16/2018   BILITOT 0.7 10/16/2018   GFRNONAA >60 10/16/2018   GFRAA >60 10/16/2018    Lab Results  Component Value Date   WBC 3.4 (L) 10/16/2018   NEUTROABS 1.8 10/16/2018   HGB 14.1 10/16/2018   HCT 42.6 10/16/2018   MCV 91.2 10/16/2018   PLT 246 10/16/2018     STUDIES: No results found.  ASSESSMENT: Recurrent, stage IV rectal adenocarcinoma.  PLAN:    1.   Recurrent, stage IV rectal adenocarcinoma: Patient was initially considered a clinical stage IIIa, but after completing neoadjuvant 5-FU and XRT followed by surgical excision on October 08, 2013 he was noted to be pathologic stage Ia.  He declined adjuvant FOLFOX at that time.  More recently, patient's CEA started trending up and CT of the chest revealed a large 7.7 cm left upper lobe mass biopsy consistent with rectal adenocarcinoma.  PET scan completed on June 24, 2018 did not reveal any other evidence of disease.  Case was discussed with thoracic surgery and given the size and location of his mass, surgical resection is not possible.  Treatment with XRT was also discussed, but it was agreed upon that patient should pursue FOLFOX plus Avastin as initial treatment and consider XRT later if there is any residual disease.  Proceed with cycle 6 of FOLFOX plus Avastin today.  Return to clinic in 2 days for pump removal and then in 2 weeks for further evaluation and consideration of cycle 7.  Will reimage 1 to 2 days prior to his next treatment.  2.  Leukopenia: Mild, monitor. 3.  Hyphae seen on biopsy: Serum galactomannan antigen is pending. 4.  Peripheral neuropathy: Resolved.  Monitor. 5.  Cough: Chronic and unchanged.  Continue Tessalon Perles as needed. 6.  Insomnia: Patient does not complain of this today.  Continue Ambien as needed.  I spent a total of 30 minutes face-to-face with the patient of which greater than 50% of the visit was spent in counseling and coordination of care as detailed above.   Patient expressed understanding and was in agreement with this plan. He also understands that He can call clinic at any time with any questions, concerns, or complaints.    Lloyd Huger, MD   10/17/2018 6:13 AM

## 2018-10-15 ENCOUNTER — Encounter: Payer: Self-pay | Admitting: Oncology

## 2018-10-15 ENCOUNTER — Other Ambulatory Visit: Payer: Self-pay

## 2018-10-15 NOTE — Progress Notes (Signed)
Patient stated that he had been doing well with no complaints. 

## 2018-10-16 ENCOUNTER — Inpatient Hospital Stay: Payer: 59

## 2018-10-16 ENCOUNTER — Inpatient Hospital Stay (HOSPITAL_BASED_OUTPATIENT_CLINIC_OR_DEPARTMENT_OTHER): Payer: 59 | Admitting: Oncology

## 2018-10-16 ENCOUNTER — Other Ambulatory Visit: Payer: Self-pay

## 2018-10-16 VITALS — BP 131/94 | HR 93 | Temp 98.6°F | Wt 263.0 lb

## 2018-10-16 DIAGNOSIS — C20 Malignant neoplasm of rectum: Secondary | ICD-10-CM

## 2018-10-16 DIAGNOSIS — D72819 Decreased white blood cell count, unspecified: Secondary | ICD-10-CM | POA: Diagnosis not present

## 2018-10-16 DIAGNOSIS — G4700 Insomnia, unspecified: Secondary | ICD-10-CM | POA: Diagnosis not present

## 2018-10-16 DIAGNOSIS — Z933 Colostomy status: Secondary | ICD-10-CM | POA: Diagnosis not present

## 2018-10-16 DIAGNOSIS — R05 Cough: Secondary | ICD-10-CM | POA: Diagnosis not present

## 2018-10-16 DIAGNOSIS — Z95828 Presence of other vascular implants and grafts: Secondary | ICD-10-CM

## 2018-10-16 DIAGNOSIS — Z5111 Encounter for antineoplastic chemotherapy: Secondary | ICD-10-CM | POA: Diagnosis not present

## 2018-10-16 DIAGNOSIS — Z5112 Encounter for antineoplastic immunotherapy: Secondary | ICD-10-CM | POA: Diagnosis not present

## 2018-10-16 DIAGNOSIS — Z79899 Other long term (current) drug therapy: Secondary | ICD-10-CM | POA: Diagnosis not present

## 2018-10-16 LAB — COMPREHENSIVE METABOLIC PANEL
ALT: 30 U/L (ref 0–44)
AST: 31 U/L (ref 15–41)
Albumin: 4 g/dL (ref 3.5–5.0)
Alkaline Phosphatase: 72 U/L (ref 38–126)
Anion gap: 9 (ref 5–15)
BUN: 23 mg/dL (ref 8–23)
CO2: 24 mmol/L (ref 22–32)
Calcium: 9 mg/dL (ref 8.9–10.3)
Chloride: 104 mmol/L (ref 98–111)
Creatinine, Ser: 1.05 mg/dL (ref 0.61–1.24)
GFR calc Af Amer: >60 mL/min (ref >60)
GFR calc non Af Amer: >60 mL/min (ref >60)
Glucose, Bld: 122 mg/dL — ABNORMAL HIGH (ref 70–99)
Potassium: 4.1 mmol/L (ref 3.5–5.1)
Sodium: 137 mmol/L (ref 135–145)
Total Bilirubin: 0.7 mg/dL (ref 0.3–1.2)
Total Protein: 6.6 g/dL (ref 6.5–8.1)

## 2018-10-16 LAB — PSA: Prostatic Specific Antigen: 7.89 ng/mL — ABNORMAL HIGH (ref 0.00–4.00)

## 2018-10-16 LAB — CBC WITH DIFFERENTIAL/PLATELET
Abs Immature Granulocytes: 0.01 10*3/uL (ref 0.00–0.07)
Basophils Absolute: 0.1 10*3/uL (ref 0.0–0.1)
Basophils Relative: 2 %
Eosinophils Absolute: 0.3 10*3/uL (ref 0.0–0.5)
Eosinophils Relative: 10 %
HCT: 42.6 % (ref 39.0–52.0)
Hemoglobin: 14.1 g/dL (ref 13.0–17.0)
Immature Granulocytes: 0 %
Lymphocytes Relative: 20 %
Lymphs Abs: 0.7 10*3/uL (ref 0.7–4.0)
MCH: 30.2 pg (ref 26.0–34.0)
MCHC: 33.1 g/dL (ref 30.0–36.0)
MCV: 91.2 fL (ref 80.0–100.0)
Monocytes Absolute: 0.5 10*3/uL (ref 0.1–1.0)
Monocytes Relative: 15 %
Neutro Abs: 1.8 10*3/uL (ref 1.7–7.7)
Neutrophils Relative %: 53 %
Platelets: 246 10*3/uL (ref 150–400)
RBC: 4.67 MIL/uL (ref 4.22–5.81)
RDW: 17.5 % — ABNORMAL HIGH (ref 11.5–15.5)
WBC: 3.4 10*3/uL — ABNORMAL LOW (ref 4.0–10.5)
nRBC: 0 % (ref 0.0–0.2)

## 2018-10-16 LAB — PROTEIN, URINE, RANDOM: Total Protein, Urine: 6 mg/dL

## 2018-10-16 MED ORDER — DEXTROSE 5 % IV SOLN
Freq: Once | INTRAVENOUS | Status: AC
  Administered 2018-10-16: 11:00:00 via INTRAVENOUS
  Filled 2018-10-16: qty 250

## 2018-10-16 MED ORDER — DEXAMETHASONE SODIUM PHOSPHATE 10 MG/ML IJ SOLN
10.0000 mg | Freq: Once | INTRAMUSCULAR | Status: AC
  Administered 2018-10-16: 10 mg via INTRAVENOUS
  Filled 2018-10-16: qty 1

## 2018-10-16 MED ORDER — OXALIPLATIN CHEMO INJECTION 100 MG/20ML
85.0000 mg/m2 | Freq: Once | INTRAVENOUS | Status: AC
  Administered 2018-10-16: 220 mg via INTRAVENOUS
  Filled 2018-10-16: qty 40

## 2018-10-16 MED ORDER — SODIUM CHLORIDE 0.9 % IV SOLN
600.0000 mg | Freq: Once | INTRAVENOUS | Status: AC
  Administered 2018-10-16: 600 mg via INTRAVENOUS
  Filled 2018-10-16: qty 16

## 2018-10-16 MED ORDER — PALONOSETRON HCL INJECTION 0.25 MG/5ML
0.2500 mg | Freq: Once | INTRAVENOUS | Status: AC
  Administered 2018-10-16: 0.25 mg via INTRAVENOUS
  Filled 2018-10-16: qty 5

## 2018-10-16 MED ORDER — LEUCOVORIN CALCIUM INJECTION 350 MG
1000.0000 mg | Freq: Once | INTRAVENOUS | Status: AC
  Administered 2018-10-16: 11:00:00 1000 mg via INTRAVENOUS
  Filled 2018-10-16: qty 50

## 2018-10-16 MED ORDER — SODIUM CHLORIDE 0.9% FLUSH
10.0000 mL | Freq: Once | INTRAVENOUS | Status: AC
  Administered 2018-10-16: 09:00:00 10 mL via INTRAVENOUS
  Filled 2018-10-16: qty 10

## 2018-10-16 MED ORDER — FLUOROURACIL CHEMO INJECTION 2.5 GM/50ML
400.0000 mg/m2 | Freq: Once | INTRAVENOUS | Status: AC
  Administered 2018-10-16: 14:00:00 1000 mg via INTRAVENOUS
  Filled 2018-10-16: qty 20

## 2018-10-16 MED ORDER — SODIUM CHLORIDE 0.9 % IV SOLN
2400.0000 mg/m2 | INTRAVENOUS | Status: DC
  Administered 2018-10-16: 14:00:00 6150 mg via INTRAVENOUS
  Filled 2018-10-16: qty 100

## 2018-10-17 ENCOUNTER — Encounter: Payer: Self-pay | Admitting: Oncology

## 2018-10-17 LAB — CEA: CEA: 5.9 ng/mL — ABNORMAL HIGH (ref 0.0–4.7)

## 2018-10-17 LAB — TESTOSTERONE: Testosterone: 135 ng/dL — ABNORMAL LOW (ref 264–916)

## 2018-10-18 ENCOUNTER — Other Ambulatory Visit: Payer: Self-pay

## 2018-10-18 ENCOUNTER — Inpatient Hospital Stay: Payer: 59

## 2018-10-18 DIAGNOSIS — R05 Cough: Secondary | ICD-10-CM | POA: Diagnosis not present

## 2018-10-18 DIAGNOSIS — Z933 Colostomy status: Secondary | ICD-10-CM | POA: Diagnosis not present

## 2018-10-18 DIAGNOSIS — C20 Malignant neoplasm of rectum: Secondary | ICD-10-CM | POA: Diagnosis not present

## 2018-10-18 DIAGNOSIS — G4700 Insomnia, unspecified: Secondary | ICD-10-CM | POA: Diagnosis not present

## 2018-10-18 DIAGNOSIS — Z5111 Encounter for antineoplastic chemotherapy: Secondary | ICD-10-CM | POA: Diagnosis not present

## 2018-10-18 DIAGNOSIS — Z79899 Other long term (current) drug therapy: Secondary | ICD-10-CM | POA: Diagnosis not present

## 2018-10-18 DIAGNOSIS — D72819 Decreased white blood cell count, unspecified: Secondary | ICD-10-CM | POA: Diagnosis not present

## 2018-10-18 DIAGNOSIS — Z5112 Encounter for antineoplastic immunotherapy: Secondary | ICD-10-CM | POA: Diagnosis not present

## 2018-10-18 MED ORDER — HEPARIN SOD (PORK) LOCK FLUSH 100 UNIT/ML IV SOLN
500.0000 [IU] | Freq: Once | INTRAVENOUS | Status: AC | PRN
  Administered 2018-10-18: 500 [IU]
  Filled 2018-10-18: qty 5

## 2018-10-18 MED ORDER — SODIUM CHLORIDE 0.9% FLUSH
10.0000 mL | INTRAVENOUS | Status: DC | PRN
  Administered 2018-10-18: 10 mL
  Filled 2018-10-18: qty 10

## 2018-10-28 ENCOUNTER — Other Ambulatory Visit: Payer: 59

## 2018-10-30 ENCOUNTER — Ambulatory Visit: Payer: 59 | Admitting: Oncology

## 2018-10-30 ENCOUNTER — Ambulatory Visit: Payer: 59

## 2018-10-30 ENCOUNTER — Other Ambulatory Visit: Payer: 59

## 2018-11-01 ENCOUNTER — Encounter: Payer: Self-pay | Admitting: Oncology

## 2018-11-04 ENCOUNTER — Other Ambulatory Visit: Payer: Self-pay | Admitting: Oncology

## 2018-11-04 ENCOUNTER — Ambulatory Visit
Admission: RE | Admit: 2018-11-04 | Discharge: 2018-11-04 | Disposition: A | Discharge status: Home / Self Care | Payer: 59 | Source: Ambulatory Visit | Attending: Oncology | Admitting: Oncology

## 2018-11-04 ENCOUNTER — Other Ambulatory Visit: Payer: Self-pay

## 2018-11-04 DIAGNOSIS — Z933 Colostomy status: Secondary | ICD-10-CM | POA: Insufficient documentation

## 2018-11-04 DIAGNOSIS — I2510 Atherosclerotic heart disease of native coronary artery without angina pectoris: Secondary | ICD-10-CM | POA: Diagnosis not present

## 2018-11-04 DIAGNOSIS — N200 Calculus of kidney: Secondary | ICD-10-CM | POA: Diagnosis not present

## 2018-11-04 DIAGNOSIS — C20 Malignant neoplasm of rectum: Secondary | ICD-10-CM | POA: Insufficient documentation

## 2018-11-04 DIAGNOSIS — K5730 Diverticulosis of large intestine without perforation or abscess without bleeding: Secondary | ICD-10-CM | POA: Diagnosis not present

## 2018-11-04 LAB — GLUCOSE, CAPILLARY: Glucose-Capillary: 111 mg/dL — ABNORMAL HIGH (ref 70–99)

## 2018-11-04 MED ORDER — FLUDEOXYGLUCOSE F - 18 (FDG) INJECTION
13.4000 | Freq: Once | INTRAVENOUS | Status: AC | PRN
  Administered 2018-11-04: 13.28 via INTRAVENOUS

## 2018-11-06 ENCOUNTER — Inpatient Hospital Stay: Payer: 59

## 2018-11-06 ENCOUNTER — Encounter: Payer: Self-pay | Admitting: Oncology

## 2018-11-06 ENCOUNTER — Inpatient Hospital Stay: Payer: 59 | Admitting: Oncology

## 2018-11-07 ENCOUNTER — Inpatient Hospital Stay: Payer: 59 | Attending: Oncology

## 2018-11-07 NOTE — Progress Notes (Signed)
Tumor Board Documentation  Ashaun Gaughan Forster was presented by Dr Grayland Ormond at our Tumor Board on 11/07/2018, which included representatives from medical oncology, radiation oncology, pathology, radiology, surgical, navigation, internal medicine, pharmacy, genetics, pulmonology, palliative care, research.  Obert currently presents as a current patient, for discussion with history of the following treatments: active survellience, adjuvant chemotherapy.  Additionally, we reviewed previous medical and familial history, history of present illness, and recent lab results along with all available histopathologic and imaging studies. The tumor board considered available treatment options and made the following recommendations:   Refer to Dr Duwayne Heck for Bronchosopy and cultures, possible referral to Infectious disease  The following procedures/referrals were also placed: No orders of the defined types were placed in this encounter.   Clinical Trial Status: not discussed   Staging used: Not Applicable  National site-specific guidelines   were discussed with respect to the case.  Tumor board is a meeting of clinicians from various specialty areas who evaluate and discuss patients for whom a multidisciplinary approach is being considered. Final determinations in the plan of care are those of the provider(s). The responsibility for follow up of recommendations given during tumor board is that of the provider.   Today's extended care, comprehensive team conference, Geoffery was not present for the discussion and was not examined.   Multidisciplinary Tumor Board is a multidisciplinary case peer review process.  Decisions discussed in the Multidisciplinary Tumor Board reflect the opinions of the specialists present at the conference without having examined the patient.  Ultimately, treatment and diagnostic decisions rest with the primary provider(s) and the patient.

## 2018-11-08 ENCOUNTER — Inpatient Hospital Stay: Payer: 59

## 2018-11-08 MED FILL — TADALAFIL 20 MG TABS: 20 | 30 days supply | Qty: 6 | Fill #4

## 2018-11-11 ENCOUNTER — Encounter: Payer: Self-pay | Admitting: Oncology

## 2018-11-12 ENCOUNTER — Encounter: Payer: Self-pay | Admitting: *Deleted

## 2018-11-13 ENCOUNTER — Telehealth: Payer: Self-pay | Admitting: Pulmonary Disease

## 2018-11-13 ENCOUNTER — Ambulatory Visit: Payer: 59 | Admitting: Oncology

## 2018-11-13 ENCOUNTER — Other Ambulatory Visit: Payer: Self-pay

## 2018-11-13 ENCOUNTER — Ambulatory Visit (INDEPENDENT_AMBULATORY_CARE_PROVIDER_SITE_OTHER): Payer: 59 | Admitting: Pulmonary Disease

## 2018-11-13 ENCOUNTER — Encounter: Payer: Self-pay | Admitting: Pulmonary Disease

## 2018-11-13 ENCOUNTER — Ambulatory Visit: Payer: 59

## 2018-11-13 ENCOUNTER — Other Ambulatory Visit: Payer: 59

## 2018-11-13 VITALS — BP 132/78 | HR 90 | Temp 97.8°F | Ht 75.0 in | Wt 262.6 lb

## 2018-11-13 DIAGNOSIS — C20 Malignant neoplasm of rectum: Secondary | ICD-10-CM | POA: Diagnosis not present

## 2018-11-13 DIAGNOSIS — R918 Other nonspecific abnormal finding of lung field: Secondary | ICD-10-CM

## 2018-11-13 DIAGNOSIS — Z933 Colostomy status: Secondary | ICD-10-CM | POA: Diagnosis not present

## 2018-11-13 NOTE — Telephone Encounter (Signed)
Pre admit testing 11/15/2018 at 1:00 with covid test to follow.  Pt is aware and voiced his understanding,

## 2018-11-13 NOTE — Progress Notes (Signed)
Subjective:    Patient ID: [name] [name], male    DOB: 08/09/1955, 63 y.o.   MRN: [id number]  HPI This is a 63 year old lifelong never smoker, he has a history of colon cancer, presents for follow-up of a left lung mass.  We first evaluated him on 26 June 2018.  Initially referred by Dr. Delight Hoh, revisited case via tumor board conference on 07 November 2018.  The patient has a history of stage I rectal adenocarcinoma at the anal verge diagnosed and resected in 2015.  He has a permanent colostomy.  He was treated with neoadjuvant 5-FU and radiation followed by the surgical excision as noted above.  This was performed in September 2015.  He had declined adjuvant FOLFOX therapy previously.  In May of this year it was noted that the patient's CEA was started to trend up and noted to be high as 8.3.  The patient underwent CT scan of the chest abdomen and pelvis on 21 May. The chest portion showed a very large 7.4 cm left upper lobe mass attached to the hilum.  The patient underwent PET/CT on 1 June which showed the mass to be necrotic with a significantly hypermetabolic rim.  Patient underwent bronchoscopy with endobronchial ultrasound and TBNA on 01 July 2018.  The pathology was consistent with malignancy with ROSE during the procedure.  Subsequently adenocarcinoma of rectal origin was diagnosed from bronchial brushing.  There was however, evidence of fungal elements on the biopsy noted.  Aspergillus antigen from the BAL was negative.  Patient underwent PET/CT on 04 November 2018 that showed that the previously noted enhancing ring on the lung lesion had decreased in cavity.  The lesion however remains stable at 7.4 to 7.6 cm findings are consistent with a fungus ball.    The patient is having only occasional nonproductive cough.  No hemoptysis.  He does not endorse any fevers, chills or sweats.  I have discussed the findings with the patient including the initial CT that showed an enhancing rim now to no  enhancing rim but persistent changes consistent with fungus ball.  He will need bronchoscopy to obtain tissue for culture also to determine whether the cavity is closed or open.  Patient understands that the procedure will have to be done under general anesthesia in case there is fluid in the cavity.  Fluid spillage can cause issues if the airway is not protected.  Review of Systems  Constitutional: Negative.   HENT: Negative.   Eyes: Negative.   Respiratory: Positive for cough (Nonproductive).   Cardiovascular: Negative.   Gastrointestinal: Negative.   Endocrine: Negative.   Genitourinary: Negative.   Musculoskeletal: Negative.   Skin: Negative.   Allergic/Immunologic: Negative.   Neurological: Negative.   Hematological: Negative.   Psychiatric/Behavioral: Negative.   All other systems reviewed and are negative.      Objective:   Physical Exam Vitals signs and nursing note reviewed.  Constitutional:      Appearance: He is overweight. He is not ill-appearing or toxic-appearing.  HENT:     Head: Normocephalic and atraumatic.     Right Ear: External ear normal.     Left Ear: External ear normal.     Nose:     Comments: Nose mouth and throat: Deferred at present due to masking  requirements for COVID-19. Eyes:     General: No scleral icterus.    Conjunctiva/sclera: Conjunctivae normal.     Pupils: Pupils are equal, round, and reactive to light.  Neck:  Musculoskeletal: Neck supple.     Thyroid: No thyromegaly.     Vascular: No JVD.     Trachea: Trachea and phonation normal.  Cardiovascular:     Rate and Rhythm: Normal rate and regular rhythm.     Pulses: Normal pulses.     Heart sounds: Normal heart sounds.  Pulmonary:     Effort: Pulmonary effort is normal. No respiratory distress.     Breath sounds: Normal breath sounds. Rhonchi: Left midlung field.  Abdominal:     General: Abdomen is protuberant. The ostomy site is clean. There is no distension.     Comments:  Left colostomy.  Musculoskeletal: Normal range of motion.     Right lower leg: No edema.     Left lower leg: No edema.  Skin:    General: Skin is warm and dry.  Neurological:     General: No focal deficit present.     Mental Status: He is alert and oriented to person, place, and time.  Psychiatric:        Mood and Affect: Mood normal.        Behavior: Behavior normal.    Assessment & Plan:  1.  Left upper lobe lung mass, 7.6 cm:  Previous biopsy show that patient had metastatic rectal adenocarcinoma in the enhancing portions of the mass.  This has been treated with chemotherapy and appears to be responding as it no longer enhances.  Most likely this is a residual fungal ball.  Scant reports in the literature show some cavitary metastatic lesions that can subsequently be colonized with Aspergillus and become fungus balls.  If this is the case, the treatment should be surgical however, lesion is in a very difficult position to safely do surgery unless it where surgery to address the cavity and nothing more.  It meant with itraconazole or similar antifungal is the most part not effective.  Patient will undergo bronchoscopy under general anesthesia and inspection of the airway will be performed.  Appropriate biopsies and cultures will be obtained.  We will try to dilate the bronchus if it appears obstructed see if material can be evacuated.  Benefits, limitations and potential complications of the procedure were discussed with the patient/family  including, but not limited to bleeding, hemoptysis, respiratory failure requiring intubation and/or prolongued mechanical ventilation, infection, pneumothorax (collapse of lung) requiring chest tube placement, stroke from air embolism or even death.  Patient agrees to proceed.  Procedure is tentatively scheduled for 28 October at 1 PM.  2.  Rectal adenocarcinoma at the anal verge with metastatic disease to the chest:  Patient is undergoing chemotherapy.  We  will rebiopsy to ensure that static disease is indeed responding.  3.  Status post colostomy: Colostomy in situ.   This was discussed with Dr. Grayland Ormond.   This chart was dictated using voice recognition software/Dragon.  Despite best efforts to proofread, errors can occur which can change the meaning.  Any change was purely unintentional.

## 2018-11-13 NOTE — H&P (View-Only) (Signed)
Subjective:    Patient ID: [name] [name], male    DOB: 10-Aug-1955, 63 y.o.   MRN: [id number]  HPI This is a 63 year old lifelong never smoker, he has a history of colon cancer, presents for follow-up of a left lung mass.  We first evaluated him on 26 June 2018.  Initially referred by Dr. Delight Hoh, revisited case via tumor board conference on 07 November 2018.  The patient has a history of stage I rectal adenocarcinoma at the anal verge diagnosed and resected in 2015.  He has a permanent colostomy.  He was treated with neoadjuvant 5-FU and radiation followed by the surgical excision as noted above.  This was performed in September 2015.  He had declined adjuvant FOLFOX therapy previously.  In May of this year it was noted that the patient's CEA was started to trend up and noted to be high as 8.3.  The patient underwent CT scan of the chest abdomen and pelvis on 21 May. The chest portion showed a very large 7.4 cm left upper lobe mass attached to the hilum.  The patient underwent PET/CT on 1 June which showed the mass to be necrotic with a significantly hypermetabolic rim.  Patient underwent bronchoscopy with endobronchial ultrasound and TBNA on 01 July 2018.  The pathology was consistent with malignancy with ROSE during the procedure.  Subsequently adenocarcinoma of rectal origin was diagnosed from bronchial brushing.  There was however, evidence of fungal elements on the biopsy noted.  Aspergillus antigen from the BAL was negative.  Patient underwent PET/CT on 04 November 2018 that showed that the previously noted enhancing ring on the lung lesion had decreased in cavity.  The lesion however remains stable at 7.4 to 7.6 cm findings are consistent with a fungus ball.    The patient is having only occasional nonproductive cough.  No hemoptysis.  He does not endorse any fevers, chills or sweats.  I have discussed the findings with the patient including the initial CT that showed an enhancing rim now to no  enhancing rim but persistent changes consistent with fungus ball.  He will need bronchoscopy to obtain tissue for culture also to determine whether the cavity is closed or open.  Patient understands that the procedure will have to be done under general anesthesia in case there is fluid in the cavity.  Fluid spillage can cause issues if the airway is not protected.  Review of Systems  Constitutional: Negative.   HENT: Negative.   Eyes: Negative.   Respiratory: Positive for cough (Nonproductive).   Cardiovascular: Negative.   Gastrointestinal: Negative.   Endocrine: Negative.   Genitourinary: Negative.   Musculoskeletal: Negative.   Skin: Negative.   Allergic/Immunologic: Negative.   Neurological: Negative.   Hematological: Negative.   Psychiatric/Behavioral: Negative.   All other systems reviewed and are negative.      Objective:   Physical Exam Vitals signs and nursing note reviewed.  Constitutional:      Appearance: He is overweight. He is not ill-appearing or toxic-appearing.  HENT:     Head: Normocephalic and atraumatic.     Right Ear: External ear normal.     Left Ear: External ear normal.     Nose:     Comments: Nose mouth and throat: Deferred at present due to masking  requirements for COVID-19. Eyes:     General: No scleral icterus.    Conjunctiva/sclera: Conjunctivae normal.     Pupils: Pupils are equal, round, and reactive to light.  Neck:  Musculoskeletal: Neck supple.     Thyroid: No thyromegaly.     Vascular: No JVD.     Trachea: Trachea and phonation normal.  Cardiovascular:     Rate and Rhythm: Normal rate and regular rhythm.     Pulses: Normal pulses.     Heart sounds: Normal heart sounds.  Pulmonary:     Effort: Pulmonary effort is normal. No respiratory distress.     Breath sounds: Normal breath sounds. Rhonchi: Left midlung field.  Abdominal:     General: Abdomen is protuberant. The ostomy site is clean. There is no distension.     Comments:  Left colostomy.  Musculoskeletal: Normal range of motion.     Right lower leg: No edema.     Left lower leg: No edema.  Skin:    General: Skin is warm and dry.  Neurological:     General: No focal deficit present.     Mental Status: He is alert and oriented to person, place, and time.  Psychiatric:        Mood and Affect: Mood normal.        Behavior: Behavior normal.    Assessment & Plan:  1.  Left upper lobe lung mass, 7.6 cm:  Previous biopsy show that patient had metastatic rectal adenocarcinoma in the enhancing portions of the mass.  This has been treated with chemotherapy and appears to be responding as it no longer enhances.  Most likely this is a residual fungal ball.  Scant reports in the literature show some cavitary metastatic lesions that can subsequently be colonized with Aspergillus and become fungus balls.  If this is the case, the treatment should be surgical however, lesion is in a very difficult position to safely do surgery unless it where surgery to address the cavity and nothing more.  It meant with itraconazole or similar antifungal is the most part not effective.  Patient will undergo bronchoscopy under general anesthesia and inspection of the airway will be performed.  Appropriate biopsies and cultures will be obtained.  We will try to dilate the bronchus if it appears obstructed see if material can be evacuated.  Benefits, limitations and potential complications of the procedure were discussed with the patient/family  including, but not limited to bleeding, hemoptysis, respiratory failure requiring intubation and/or prolongued mechanical ventilation, infection, pneumothorax (collapse of lung) requiring chest tube placement, stroke from air embolism or even death.  Patient agrees to proceed.  Procedure is tentatively scheduled for 28 October at 1 PM.  2.  Rectal adenocarcinoma at the anal verge with metastatic disease to the chest:  Patient is undergoing chemotherapy.  We  will rebiopsy to ensure that static disease is indeed responding.  3.  Status post colostomy: Colostomy in situ.   This was discussed with Dr. Grayland Ormond.   This chart was dictated using voice recognition software/Dragon.  Despite best efforts to proofread, errors can occur which can change the meaning.  Any change was purely unintentional.

## 2018-11-13 NOTE — Patient Instructions (Signed)
1.  We will schedule bronchoscopy under general anesthesia for examination of the airway, dilation of the bronchi and aspiration/biopsy for cultures tentative date of procedure 28 October at 1 PM.  2.  We will see you in follow-up in 2 to 3 weeks.

## 2018-11-13 NOTE — Telephone Encounter (Signed)
Therapeutic bronch has been scheduled for 11/20/2018 at 1:00.  CPT: [phone] Dx: cystic lung mass

## 2018-11-15 ENCOUNTER — Other Ambulatory Visit: Payer: Self-pay

## 2018-11-15 ENCOUNTER — Other Ambulatory Visit
Admission: RE | Admit: 2018-11-15 | Discharge: 2018-11-15 | Disposition: A | Discharge status: Home / Self Care | Payer: 59 | Source: Ambulatory Visit | Attending: Pulmonary Disease | Admitting: Pulmonary Disease

## 2018-11-15 DIAGNOSIS — Z20828 Contact with and (suspected) exposure to other viral communicable diseases: Secondary | ICD-10-CM | POA: Diagnosis not present

## 2018-11-15 DIAGNOSIS — Z01812 Encounter for preprocedural laboratory examination: Secondary | ICD-10-CM | POA: Diagnosis not present

## 2018-11-15 HISTORY — DX: Pneumonia, unspecified organism: J18.9

## 2018-11-15 HISTORY — DX: Dyspnea, unspecified: R06.00

## 2018-11-15 NOTE — Telephone Encounter (Signed)
11/15/2018 at 3:05 pm EST  Called and spoke with Crystal at [facility]. Procedure code [phone] and [phone] are V/B codes which does not require PA. Call Ref # [id number]. Rhonda J Cobb

## 2018-11-15 NOTE — Patient Instructions (Signed)
Your procedure is scheduled on: Wed. 10/28 Report to Day Surgery. To find out your arrival time please call [phone] between 1PM - 3PM on Tues 10/27.  Remember: Instructions that are not followed completely may result in serious medical risk,  up to and including death, or upon the discretion of your surgeon and anesthesiologist your  surgery may need to be rescheduled.     _X__ 1. Do not eat food after midnight the night before your procedure.                 No gum chewing or hard candies. You may drink clear liquids up to 2 hours                 before you are scheduled to arrive for your surgery- DO not drink clear                 liquids within 2 hours of the start of your surgery.                 Clear Liquids include:  water, Cooprider juice without pulp, clear carbohydrate                 drink such as Clearfast of Gatorade, Black Coffee or Tea (Do not add                 anything to coffee or tea).  __X__2.  On the morning of surgery brush your teeth with toothpaste and water, you                may rinse your mouth with mouthwash if you wish.  Do not swallow any toothpaste of mouthwash.     _X__ 3.  No Alcohol for 24 hours before or after surgery.   _X__ 4.  Do Not Smoke or use e-cigarettes For 24 Hours Prior to Your Surgery.                 Do not use any chewable tobacco products for at least 6 hours prior to                 surgery.  ____  5.  Bring all medications with you on the day of surgery if instructed.   ___x_  6.  Notify your doctor if there is any change in your medical condition      (cold, fever, infections).     Do not wear jewelry, make-up, hairpins, clips or nail polish. Do not wear lotions, powders, or perfumes. You may wear deodorant. Do not shave 48 hours prior to surgery. Men may shave face and neck. Do not bring valuables to the hospital.    [facility] is not responsible for any belongings or valuables.  Contacts,  dentures or bridgework may not be worn into surgery. Leave your suitcase in the car. After surgery it may be brought to your room. For patients admitted to the hospital, discharge time is determined by your treatment team.   Patients discharged the day of surgery will not be allowed to drive home.   Please read over the following fact sheets that you were given:    ____ Take these medicines the morning of surgery with A SIP OF WATER:    1. none  2.   3.   4.  5.  6.  ____ Fleet Enema (as directed)   ____ Use CHG Soap as directed  __x__ Use inhalers on the day of  surgery albuterol (VENTOLIN HFA) 108 (90 Base) MCG/ACT inhaler  Bring with you  ____ Stop metformin 2 days prior to surgery    ____ Take 1/2 of usual insulin dose the night before surgery. No insulin the morning          of surgery.   ____ Stop Coumadin/Plavix/aspirin on   __x__ Stop Anti-inflammatories no ibuprofen aleve or aspirin after today         May take tylenol   ____ Stop supplements until after surgery.    ____ Bring C-Pap to the hospital.

## 2018-11-16 LAB — SARS CORONAVIRUS 2 (TAT 6-24 HRS): SARS Coronavirus 2: NEGATIVE

## 2018-11-19 ENCOUNTER — Ambulatory Visit: Payer: 59 | Admitting: Oncology

## 2018-11-19 ENCOUNTER — Other Ambulatory Visit: Payer: 59

## 2018-11-20 ENCOUNTER — Encounter: Payer: Self-pay | Admitting: Oncology

## 2018-11-20 ENCOUNTER — Ambulatory Visit: Payer: 59 | Admitting: Anesthesiology

## 2018-11-20 ENCOUNTER — Ambulatory Visit
Admission: RE | Admit: 2018-11-20 | Discharge: 2018-11-20 | Disposition: A | Discharge status: Home / Self Care | Payer: 59 | Attending: Pulmonary Disease | Admitting: Pulmonary Disease

## 2018-11-20 ENCOUNTER — Ambulatory Visit: Payer: 59

## 2018-11-20 ENCOUNTER — Other Ambulatory Visit: Payer: Self-pay

## 2018-11-20 ENCOUNTER — Encounter
Admission: RE | Disposition: A | Discharge status: Home / Self Care | Payer: Self-pay | Source: Home / Self Care | Attending: Pulmonary Disease

## 2018-11-20 DIAGNOSIS — Z923 Personal history of irradiation: Secondary | ICD-10-CM | POA: Insufficient documentation

## 2018-11-20 DIAGNOSIS — Z933 Colostomy status: Secondary | ICD-10-CM | POA: Insufficient documentation

## 2018-11-20 DIAGNOSIS — R918 Other nonspecific abnormal finding of lung field: Secondary | ICD-10-CM | POA: Diagnosis not present

## 2018-11-20 DIAGNOSIS — R05 Cough: Secondary | ICD-10-CM | POA: Insufficient documentation

## 2018-11-20 DIAGNOSIS — C7989 Secondary malignant neoplasm of other specified sites: Secondary | ICD-10-CM | POA: Diagnosis not present

## 2018-11-20 DIAGNOSIS — Z85118 Personal history of other malignant neoplasm of bronchus and lung: Secondary | ICD-10-CM | POA: Insufficient documentation

## 2018-11-20 DIAGNOSIS — J984 Other disorders of lung: Secondary | ICD-10-CM | POA: Diagnosis not present

## 2018-11-20 DIAGNOSIS — Z85048 Personal history of other malignant neoplasm of rectum, rectosigmoid junction, and anus: Secondary | ICD-10-CM | POA: Diagnosis not present

## 2018-11-20 DIAGNOSIS — Z6832 Body mass index (BMI) 32.0-32.9, adult: Secondary | ICD-10-CM | POA: Insufficient documentation

## 2018-11-20 DIAGNOSIS — B479 Mycetoma, unspecified: Secondary | ICD-10-CM | POA: Insufficient documentation

## 2018-11-20 DIAGNOSIS — Z79899 Other long term (current) drug therapy: Secondary | ICD-10-CM | POA: Insufficient documentation

## 2018-11-20 DIAGNOSIS — Z9221 Personal history of antineoplastic chemotherapy: Secondary | ICD-10-CM | POA: Diagnosis not present

## 2018-11-20 DIAGNOSIS — Z9889 Other specified postprocedural states: Secondary | ICD-10-CM

## 2018-11-20 DIAGNOSIS — C7802 Secondary malignant neoplasm of left lung: Secondary | ICD-10-CM | POA: Diagnosis not present

## 2018-11-20 HISTORY — PX: FLEXIBLE BRONCHOSCOPY: SHX5094

## 2018-11-20 SURGERY — BRONCHOSCOPY, FLEXIBLE
Anesthesia: General | Laterality: Left

## 2018-11-20 MED ORDER — FAMOTIDINE 20 MG PO TABS
20.0000 mg | ORAL_TABLET | Freq: Once | ORAL | Status: AC
  Administered 2018-11-20: 13:00:00 20 mg via ORAL

## 2018-11-20 MED ORDER — SUCCINYLCHOLINE CHLORIDE 20 MG/ML IJ SOLN
INTRAMUSCULAR | Status: AC
  Filled 2018-11-20: qty 1

## 2018-11-20 MED ORDER — PROPOFOL 10 MG/ML IV BOLUS
INTRAVENOUS | Status: DC | PRN
  Administered 2018-11-20: 200 mg via INTRAVENOUS

## 2018-11-20 MED ORDER — ROCURONIUM BROMIDE 100 MG/10ML IV SOLN
INTRAVENOUS | Status: DC | PRN
  Administered 2018-11-20: 15 mg via INTRAVENOUS
  Administered 2018-11-20: 10 mg via INTRAVENOUS
  Administered 2018-11-20: 5 mg via INTRAVENOUS

## 2018-11-20 MED ORDER — LIDOCAINE HCL (PF) 2 % IJ SOLN
INTRAMUSCULAR | Status: AC
  Filled 2018-11-20: qty 10

## 2018-11-20 MED ORDER — ONDANSETRON HCL 4 MG/2ML IJ SOLN
INTRAMUSCULAR | Status: DC | PRN
  Administered 2018-11-20: 4 mg via INTRAVENOUS

## 2018-11-20 MED ORDER — PROPOFOL 10 MG/ML IV BOLUS
INTRAVENOUS | Status: AC
  Filled 2018-11-20: qty 20

## 2018-11-20 MED ORDER — SUGAMMADEX SODIUM 200 MG/2ML IV SOLN
INTRAVENOUS | Status: DC | PRN
  Administered 2018-11-20: 230 mg via INTRAVENOUS

## 2018-11-20 MED ORDER — SUGAMMADEX SODIUM 500 MG/5ML IV SOLN
INTRAVENOUS | Status: AC
  Filled 2018-11-20: qty 5

## 2018-11-20 MED ORDER — MIDAZOLAM HCL 5 MG/5ML IJ SOLN
INTRAMUSCULAR | Status: DC | PRN
  Administered 2018-11-20: 2 mg via INTRAVENOUS

## 2018-11-20 MED ORDER — DEXAMETHASONE SODIUM PHOSPHATE 4 MG/ML IJ SOLN
INTRAMUSCULAR | Status: AC
  Filled 2018-11-20: qty 1

## 2018-11-20 MED ORDER — FENTANYL CITRATE (PF) 100 MCG/2ML IJ SOLN
INTRAMUSCULAR | Status: DC | PRN
  Administered 2018-11-20: 100 ug via INTRAVENOUS

## 2018-11-20 MED ORDER — LIDOCAINE HCL (CARDIAC) PF 100 MG/5ML IV SOSY
PREFILLED_SYRINGE | INTRAVENOUS | Status: DC | PRN
  Administered 2018-11-20: 60 mg via INTRAVENOUS

## 2018-11-20 MED ORDER — SODIUM CHLORIDE 0.9 % IV SOLN
INTRAVENOUS | Status: DC | PRN
  Administered 2018-11-20: 13:00:00 via INTRAVENOUS

## 2018-11-20 MED ORDER — SODIUM CHLORIDE 0.9 % IV SOLN
Freq: Once | INTRAVENOUS | Status: AC
  Administered 2018-11-20: 12:00:00 via INTRAVENOUS

## 2018-11-20 MED ORDER — SUCCINYLCHOLINE CHLORIDE 20 MG/ML IJ SOLN
INTRAMUSCULAR | Status: DC | PRN
  Administered 2018-11-20: 100 mg via INTRAVENOUS

## 2018-11-20 MED ORDER — MIDAZOLAM HCL 2 MG/2ML IJ SOLN
INTRAMUSCULAR | Status: AC
  Filled 2018-11-20: qty 2

## 2018-11-20 MED ORDER — ONDANSETRON HCL 4 MG/2ML IJ SOLN
INTRAMUSCULAR | Status: AC
  Filled 2018-11-20: qty 2

## 2018-11-20 MED ORDER — FENTANYL CITRATE (PF) 100 MCG/2ML IJ SOLN
INTRAMUSCULAR | Status: AC
  Filled 2018-11-20: qty 2

## 2018-11-20 MED ORDER — FAMOTIDINE 20 MG PO TABS
ORAL_TABLET | ORAL | Status: AC
  Administered 2018-11-20: 13:00:00 20 mg via ORAL
  Filled 2018-11-20: qty 1

## 2018-11-20 MED ORDER — FENTANYL CITRATE (PF) 100 MCG/2ML IJ SOLN: 25.0000 ug | INTRAMUSCULAR | Status: DC | PRN

## 2018-11-20 MED ORDER — DEXAMETHASONE SODIUM PHOSPHATE 10 MG/ML IJ SOLN
INTRAMUSCULAR | Status: DC | PRN
  Administered 2018-11-20: 4 mg via INTRAVENOUS

## 2018-11-20 MED ORDER — ROCURONIUM BROMIDE 50 MG/5ML IV SOLN
INTRAVENOUS | Status: AC
  Filled 2018-11-20: qty 1

## 2018-11-20 MED ORDER — PHENYLEPHRINE HCL (PRESSORS) 10 MG/ML IV SOLN
INTRAVENOUS | Status: DC | PRN
  Administered 2018-11-20 (×4): 100 ug via INTRAVENOUS

## 2018-11-20 MED ORDER — ONDANSETRON HCL 4 MG/2ML IJ SOLN: 4.0000 mg | Freq: Once | INTRAMUSCULAR | Status: DC | PRN

## 2018-11-20 MED ORDER — BUTAMBEN-TETRACAINE-BENZOCAINE 2-2-14 % EX AERO
1.0000 | INHALATION_SPRAY | Freq: Once | CUTANEOUS | Status: DC
  Filled 2018-11-20: qty 20

## 2018-11-20 NOTE — Anesthesia Procedure Notes (Signed)
Procedure Name: Intubation Date/Time: 11/20/2018 1:17 PM Performed by: Dionne Bucy, CRNA Pre-anesthesia Checklist: Patient identified, Patient being monitored, Timeout performed, Emergency Drugs available and Suction available Patient Re-evaluated:Patient Re-evaluated prior to induction Oxygen Delivery Method: Circle system utilized Preoxygenation: Pre-oxygenation with 100% oxygen Induction Type: IV induction Ventilation: Mask ventilation without difficulty Laryngoscope Size: McGraph and 4 Grade View: Grade I Tube type: Oral Tube size: 8.5 mm Number of attempts: 1 Airway Equipment and Method: Stylet Placement Confirmation: ETT inserted through vocal cords under direct vision,  positive ETCO2 and breath sounds checked- equal and bilateral Secured at: 24 cm Tube secured with: Tape Dental Injury: Teeth and Oropharynx as per pre-operative assessment

## 2018-11-20 NOTE — Interval H&P Note (Signed)
History and Physical Interval Note:  11/20/2018 12:56 PM  [name] [name]  has presented today for surgery, with the diagnosis of Cystic lung mass.  The various methods of treatment have been discussed with the patient and family. After consideration of risks, benefits and other options for treatment, the patient has consented to  Procedure(s): FLEXIBLE BRONCHOSCOPY (Left) as a surgical intervention.  The patient's history has been reviewed, patient examined, no change in status, stable for surgery.  I have reviewed the patient's chart and labs.  Questions were answered to the patient's satisfaction.     Renold Don, MD [facility]

## 2018-11-20 NOTE — Discharge Instructions (Signed)
AMBULATORY SURGERY  DISCHARGE INSTRUCTIONS   1) The drugs that you were given will stay in your system until tomorrow so for the next 24 hours you should not:  A) Drive an automobile B) Make any legal decisions C) Drink any alcoholic beverage   2) You may resume regular meals tomorrow.  Today it is better to start with liquids and gradually work up to solid foods.  You may eat anything you prefer, but it is better to start with liquids, then soup and crackers, and gradually work up to solid foods.   3) Please notify your doctor immediately if you have any unusual bleeding, trouble breathing, redness and pain at the surgery site, drainage, fever, or pain not relieved by medication. 4)   5) Your post-operative visit with Dr.                                     is: Date:                        Time:    Please call to schedule your post-operative visit.  6) Additional Instructions:     Flexible Bronchoscopy, Care After This sheet gives you information about how to care for yourself after your test. Your doctor may also give you more specific instructions. If you have problems or questions, contact your doctor. Follow these instructions at home: Eating and drinking  Do not eat or drink anything (not even water) for 2 hours after your test, or until your numbing medicine (local anesthetic) wears off.  When your numbness is gone and your cough and gag reflexes have come back, you may: ? Eat only soft foods. ? Slowly drink liquids.  The day after the test, go back to your normal diet. Driving  Do not drive for 24 hours if you were given a medicine to help you relax (sedative).  Do not drive or use heavy machinery while taking prescription pain medicine. General instructions   Take over-the-counter and prescription medicines only as told by your doctor.  Return to your normal activities as told. Ask what activities are safe for you.  Do not use any products that have  nicotine or tobacco in them. This includes cigarettes and e-cigarettes. If you need help quitting, ask your doctor.  Keep all follow-up visits as told by your doctor. This is important. It is very important if you had a tissue sample (biopsy) taken. Get help right away if:  You have shortness of breath that gets worse.  You get light-headed.  You feel like you are going to pass out (faint).  You have chest pain.  You cough up: ? More than a little blood. ? More blood than before. Summary  Do not eat or drink anything (not even water) for 2 hours after your test, or until your numbing medicine wears off.  Do not use cigarettes. Do not use e-cigarettes.  Get help right away if you have chest pain. This information is not intended to replace advice given to you by your health care provider. Make sure you discuss any questions you have with your health care provider. Document Released: 11/06/2008 Document Revised: 12/22/2016 Document Reviewed: 01/28/2016 Elsevier Patient Education  2020 [employer].

## 2018-11-20 NOTE — Op Note (Signed)
PROCEDURE: BRONCHOSCOPY   PROCEDURE DATE: 11/20/2018  TIME:  [name] [name]  DOB:[birth year]  MRN: [id number] LOC:  ARPO/None    HOSP DAY: _0 @ CODE STATUS: FULL CODE  Indications/Preliminary Diagnosis:  Large left lung mass consistent with mycetoma Prior rectal adenocarcinoma lung metastasis, reexamination after chemotherapy.  Consent: (Place X beside choice/s below)  The benefits, risks and possible complications of the procedure were        explained to:  _X__ patient  ___ patient's family  ___ other:___________  who verbalized understanding and gave:  ___ verbal  ___ written  _X__ verbal and written  ___ telephone  ___ other:________ consent.      Unable to obtain consent; procedure performed on emergent basis.     Other:   Benefits, limitations and potential complications of the procedure were discussed with the patient/family  including, but not limited to bleeding, hemoptysis, respiratory failure requiring intubation and/or prolongued mechanical ventilation, infection, pneumothorax (collapse of lung) requiring chest tube placement, stroke from air embolism or even death. Patient understood all the above and agreed to proceed.  Operator: Renold Don, MD Assistant/scrub: Dina Rich, RRT Circulator: Annia Belt, RRT CRNA: Dionne Bucy, CRNA   Anesthesia: General endotracheal See anesthesia records.  Supervising anesthesiologist Dr. Randa Lynn   PROCEDURE DETAILS: Patient was taken to procedure room 2 where appropriate timeout was performed and correct patient, name, identifiers and laterality confirmed.  Patient was inducted under general anesthesia and intubated by CRNA/anesthesiologist.  Patient was intubated with a #8.5 ET tube without difficulty.  A Portex adapter was placed on the ET tube flange.  Once patient was under general anesthesia the Olympus video bronchoscope was advanced into the airway via the Portex adapter.  The visible  portions of the trachea were normal.  Carina was sharp.  The right tracheobronchial tree was examined and no abnormalities or endobronchial lesions were noted.  Patient has some scant secretions that were lavaged till clear.  At this point the bronchoscope was brought to the left, the left lower lobe showed no endobronchial lesions.  The upper lobe and lingula rhonchi were occluded with white cheesy-like material with gel-like material being suctioned from behind this.  The areas that were previously biopsied that showed metastatic adenocarcinoma were biopsied again this time showing only reactive cells.  Formal pathology pending.  At this point a total of 5 biopsies were taken from this area at the origin of left upper lobe bronchus.  These were placed in formalin for histopathology.  Once this was completed biopsies were obtained from the left upper lobe material for fungal culture.  At this point an Olympus extended working channel sheath was placed through the left upper lobe bronchus and confirmed position into the mycetoma with fluoroscopy was done.  Targeted bronchoalveolar lavage was obtained at this morning for culture instilling 40 mL's of saline and yielding approximately 12 mL of BAL.  Utilizing the sheath brushings were also performed for culture.  These were performed x2 with fluoroscopic guidance.  Once this was completed the sheath was then transferred into the the lingula bronchus and a targeted BAL was performed instilling 4 mL of saline yielding approximately 15 mL of thick bloody material.  This was also sent for culture.  Once this was completed the sheath was removed and lidocaine 1% was instilled into the tracheobronchial tree.  Total of 9 mL was utilized via bronchial lavage.  Hemostasis was excellent.  The bronchoscope was then retrieved and the patient was allowed  to emerge from general anesthesia.  He was extubated in the procedure room without sequela.  Transferred to the PACU in  satisfactory condition.     Insertion Route (Place X beside choice below)   Nasal   Oral  X Endotracheal Tube   Tracheostomy   INTRAPROCEDURE MEDICATIONS:   Medication Amt Dose  Medication Amt Dose  Lidocaine 1% 9 ml  Epinephrine 1:10,000 sol  cc  Xylocaine 4%  cc    cc       SPECIMENS (Sites): (Place X beside choice below)  Specimens Description   No Specimens Obtained     Washings   X2 Lavage  LEFT upper lobe and LINGULA  X7 Biopsies  LEFT upper lobe for histo and culture   Fine Needle Aspirates   X2 Brushings  LEFT upper lobe   Sputum    FINDINGS:  ESTIMATED BLOOD LOSS: Less than 2 mL COMPLICATIONS/RESOLUTION: none, postprocedure chest x-ray no pneumothorax.  Large left mycetoma.   IMPRESSION:POST-PROCEDURE DX:  1.  Large left mycetoma 2.  No evidence of residual metastatic adenocarcinoma deposit  RECOMMENDATION/PLAN:  1.  Await fungal cultures 2.  Recommend reevaluation by thoracic surgery for potential resection 3.  Await formal pathology with regards to adenocarcinoma  Findings discussed with Dr. Grayland Ormond.  Renold Don, MD [facility]

## 2018-11-20 NOTE — Anesthesia Post-op Follow-up Note (Signed)
Anesthesia QCDR form completed.        

## 2018-11-20 NOTE — Anesthesia Preprocedure Evaluation (Signed)
Anesthesia Evaluation  Patient identified by MRN, date of birth, ID band Patient awake    Reviewed: Allergy & Precautions, NPO status , Patient's Chart, lab work & pertinent test results  History of Anesthesia Complications Negative for: history of anesthetic complications  Airway Mallampati: II       Dental no notable dental hx. (+) Dental Advidsory Given, Missing   Pulmonary neg sleep apnea, neg COPD, Not current smoker,  Pulmonary mass          Cardiovascular Exercise Tolerance: Good (-) hypertension(-) angina(-) Past MI and (-) CHF (-) dysrhythmias (-) Valvular Problems/Murmurs     Neuro/Psych neg Seizures    GI/Hepatic Neg liver ROS, neg GERD  ,  Endo/Other  neg diabetes  Renal/GU Renal disease (kidney stones)     Musculoskeletal   Abdominal   Peds  Hematology  (+) Blood dyscrasia, anemia ,   Anesthesia Other Findings Past Medical History: No date: Anemia No date: Cancer ([place])     Comment:  rectal 2015, followed by Oncology, permanent colostomy No date: Colostomy in place [facility]) No date: Dyspnea     Comment:  on exertion No date: History of kidney stones No date: Pneumonia No date: Seasonal allergies   Reproductive/Obstetrics                             Anesthesia Physical  Anesthesia Plan  ASA: II  Anesthesia Plan: General   Post-op Pain Management:    Induction: Intravenous  PONV Risk Score and Plan: 2 and Ondansetron, Dexamethasone, Midazolam and Treatment may vary due to age or medical condition  Airway Management Planned: Oral ETT  Additional Equipment:   Intra-op Plan:   Post-operative Plan: Extubation in OR  Informed Consent: I have reviewed the patients History and Physical, chart, labs and discussed the procedure including the risks, benefits and alternatives for the proposed anesthesia with the patient or authorized representative who has indicated  his/her understanding and acceptance.       Plan Discussed with:   Anesthesia Plan Comments:         Anesthesia Quick Evaluation

## 2018-11-20 NOTE — Anesthesia Postprocedure Evaluation (Signed)
Anesthesia Post Note  Patient: [name] [name]  Procedure(s) Performed: FLEXIBLE BRONCHOSCOPY (Left )  Patient location during evaluation: PACU Anesthesia Type: General Level of consciousness: awake and alert and oriented Pain management: pain level controlled Vital Signs Assessment: post-procedure vital signs reviewed and stable Respiratory status: spontaneous breathing, nonlabored ventilation and respiratory function stable Cardiovascular status: blood pressure returned to baseline and stable Postop Assessment: no signs of nausea or vomiting Anesthetic complications: no     Last Vitals:  Vitals:   11/20/18 1459 11/20/18 1506  BP: 122/75 127/72  Pulse:  69  Resp: 12 16  Temp:  (!) 36.1 C  SpO2:  97%    Last Pain:  Vitals:   11/20/18 1506  TempSrc: Temporal  PainSc: 0-No pain                 Jashan Cotten

## 2018-11-20 NOTE — Transfer of Care (Signed)
Immediate Anesthesia Transfer of Care Note  Patient: [name] [name]  Procedure(s) Performed: FLEXIBLE BRONCHOSCOPY (Left )  Patient Location: PACU  Anesthesia Type:General  Level of Consciousness: sedated  Airway & Oxygen Therapy: Patient Spontanous Breathing and Patient connected to face mask oxygen  Post-op Assessment: Report given to RN and Post -op Vital signs reviewed and stable  Post vital signs: Reviewed and stable  Last Vitals:  Vitals Value Taken Time  BP 131/82 11/20/18 1422  Temp    Pulse 71 11/20/18 1426  Resp 12 11/20/18 1426  SpO2 98 % 11/20/18 1426  Vitals shown include unvalidated device data.  Last Pain:  Vitals:   11/20/18 1203  TempSrc: Temporal  PainSc: 0-No pain         Complications: No apparent anesthesia complications

## 2018-11-21 ENCOUNTER — Encounter: Payer: Self-pay | Admitting: Pulmonary Disease

## 2018-11-21 ENCOUNTER — Other Ambulatory Visit: Payer: Self-pay | Admitting: Emergency Medicine

## 2018-11-21 ENCOUNTER — Telehealth: Payer: Self-pay | Admitting: *Deleted

## 2018-11-21 DIAGNOSIS — R05 Cough: Secondary | ICD-10-CM

## 2018-11-21 DIAGNOSIS — R059 Cough, unspecified: Secondary | ICD-10-CM

## 2018-11-21 LAB — SURGICAL PATHOLOGY

## 2018-11-21 MED ORDER — HYDROCOD POLST-CPM POLST ER 10-8 MG/5ML PO SUER: 5.0000 mL | Freq: Three times a day (TID) | ORAL | 0 refills | Status: DC | PRN

## 2018-11-21 MED ORDER — ALBUTEROL SULFATE HFA 108 (90 BASE) MCG/ACT IN AERS: 2.0000 | INHALATION_SPRAY | Freq: Four times a day (QID) | RESPIRATORY_TRACT | 1 refills | Status: DC | PRN

## 2018-11-21 NOTE — Telephone Encounter (Signed)
Entered in error

## 2018-11-22 ENCOUNTER — Encounter: Payer: Self-pay | Admitting: Pulmonary Disease

## 2018-11-22 LAB — SURGICAL PATHOLOGY

## 2018-11-24 NOTE — Progress Notes (Signed)
[place]  [phone] [phone]  ID: [name] [name] OB: Dec 16, 1955  MR#: [id number]  UYQ#:[id number]  Patient Care Team: Hubbard Hartshorn, FNP as PCP - General (Family Medicine) Marlyce Huge, MD as Attending Physician (Surgery) Lloyd Huger, MD as Consulting Physician (Oncology) Abbie Sons, MD (Urology) Eulogio Bear, MD as Consulting Physician (Ophthalmology) Flora Lipps, MD as Consulting Physician (Pulmonary Disease)   CHIEF COMPLAINT: Recurrent, stage IV rectal adenocarcinoma.  INTERVAL HISTORY: Patient returns to clinic today for further evaluation and discussion of his biopsy results.  He currently feels well and is asymptomatic.  He does not complain of peripheral neuropathy today.  He has no neurologic complaints.  He denies any recent fevers or illnesses.  He denies any chest pain, shortness of breath, or hemoptysis.  He denies nausea, vomiting, constipation, or diarrhea. He has noted no blood or melena in his colostomy bag.  He has no urinary complaints.  Patient offers no specific complaints today.  REVIEW OF SYSTEMS:   Review of Systems  Constitutional: Negative.  Negative for fever, malaise/fatigue and weight loss.  Respiratory: Positive for cough. Negative for shortness of breath.   Cardiovascular: Negative.  Negative for chest pain and leg swelling.  Gastrointestinal: Negative.  Negative for abdominal pain, blood in stool, constipation, diarrhea and melena.  Genitourinary: Negative.  Negative for dysuria.  Musculoskeletal: Negative.  Negative for back pain.  Skin: Negative.  Negative for rash.  Neurological: Negative.  Negative for tingling, sensory change, focal weakness, weakness and headaches.  Psychiatric/Behavioral: The patient is not nervous/anxious and does not have insomnia.     As per HPI. Otherwise, a complete review of systems is negative.  PAST MEDICAL HISTORY: Past Medical History:    Diagnosis Date   Anemia    Cancer ([place])    rectal 2015, followed by Oncology, permanent colostomy   Colostomy in place [facility])    Dyspnea    on exertion   History of kidney stones    Pneumonia    Seasonal allergies     PAST SURGICAL HISTORY: Past Surgical History:  Procedure Laterality Date   BACK SURGERY     CHOLECYSTECTOMY     COLON SURGERY     ENDOBRONCHIAL ULTRASOUND N/A 07/01/2018   Procedure: ENDOBRONCHIAL ULTRASOUND;  Surgeon: Tyler Pita, MD;  Location: [facility] ORS;  Service: Cardiopulmonary;  Laterality: N/A;   FLEXIBLE BRONCHOSCOPY Left 11/20/2018   Procedure: FLEXIBLE BRONCHOSCOPY;  Surgeon: Tyler Pita, MD;  Location: [facility] ORS;  Service: Cardiopulmonary;  Laterality: Left;   KNEE SURGERY Bilateral    PORT A CATH REVISION Left 08/05/2018   Procedure: PORT A CATH REVISION, REPOSITION LEFT;  Surgeon: Robert Bellow, MD;  Location: [facility] ORS;  Service: General;  Laterality: Left;   PORT-A-CATH REMOVAL Right 12/02/2014   Procedure: REMOVAL PORT-A-CATH;  Surgeon: Marlyce Huge, MD;  Location: [facility] ORS;  Service: General;  Laterality: Right;   PORTACATH PLACEMENT     PORTACATH PLACEMENT Left 07/29/2018   Procedure: INSERTION PORT-A-CATH LEFT;  Surgeon: Robert Bellow, MD;  Location: [facility] ORS;  Service: General;  Laterality: Left;    FAMILY HISTORY: Father with prostate cancer.  COPD, CHF.     ADVANCED DIRECTIVES:    HEALTH MAINTENANCE: Social History   Tobacco Use   Smoking status: Never Smoker   Smokeless tobacco: Current User    Types: Snuff   Tobacco comment: might smoke a cigar once every 3 months  Substance Use Topics   Alcohol  use: Yes    Alcohol/week: 0.0 standard drinks    Comment: RARE   Drug use: No     Colonoscopy:  PAP:  Bone density:  Lipid panel:  Allergies  Allergen Reactions   Sulfa Antibiotics Hives    Current Outpatient Medications  Medication Sig Dispense Refill   albuterol (VENTOLIN  HFA) 108 (90 Base) MCG/ACT inhaler Inhale 2 puffs into the lungs every 6 (six) hours as needed for wheezing or shortness of breath. 18 g 1   cetirizine (ZYRTEC) 10 MG tablet Take 10 mg by mouth at bedtime.      chlorpheniramine-HYDROcodone (TUSSIONEX PENNKINETIC ER) 10-8 MG/5ML SUER Take 5 mLs by mouth every 8 (eight) hours as needed for cough. 115 mL 0   Dextromethorphan-guaiFENesin (MUCINEX DM MAXIMUM STRENGTH) 60-1200 MG TB12 Take 1 tablet by mouth at bedtime as needed (congestion/drainage.).     lidocaine-prilocaine (EMLA) cream Apply to affected area once (Patient taking differently: Apply 1 application topically daily as needed (prior to port being accessed.). ) 30 g 3   loperamide (IMODIUM A-D) 2 MG capsule Take 2 mg by mouth as needed for diarrhea or loose stools.      Multiple Vitamin (MULTIVITAMIN WITH MINERALS) TABS tablet Take 1 tablet by mouth at bedtime.     NEEDLE, DISP, 18 G 18G X 1" MISC Use as directed 50 each 0   ondansetron (ZOFRAN) 8 MG tablet Take 1 tablet (8 mg total) by mouth 2 (two) times daily as needed for refractory nausea / vomiting. 30 tablet 2   Oxymetazoline HCl (AFRIN EXTRA MOISTURIZING NA) Place 1 spray into the nose 2 (two) times daily as needed (congestion.).     prochlorperazine (COMPAZINE) 10 MG tablet Take 1 tablet (10 mg total) by mouth every 6 (six) hours as needed (Nausea or vomiting). 60 tablet 2   tadalafil (ADCIRCA/CIALIS) 20 MG tablet Take 1 tablet (20 mg total) by mouth daily as needed for erectile dysfunction. 6 tablet 10   timolol (BETIMOL) 0.5 % ophthalmic solution Place 1 drop into the right eye at bedtime.      zolpidem (AMBIEN) 5 MG tablet Take 1 tablet (5 mg total) by mouth at bedtime as needed for sleep. 30 tablet 0   gabapentin (NEURONTIN) 100 MG capsule Take 1 capsule once daily before bed x2 days, then take 2 capsules once daily before bed x2 days, then may increase to 3 capsules once daily before bed if needed. (Patient not  taking: Reported on 11/15/2018) 90 capsule 1   SYRINGE-NEEDLE, DISP, 3 ML (LUER LOCK SAFETY SYRINGES) 21G X 1-1/2" 3 ML MISC Use as directed for testosterone administration (Patient not taking: Reported on 11/26/2018) 50 each 0   testosterone cypionate (DEPOTESTOSTERONE CYPIONATE) 200 MG/ML injection Inject 1 mL (200 mg total) into the muscle every 14 (fourteen) days. (Patient not taking: Reported on 11/15/2018) 10 mL 0   No current facility-administered medications for this visit.     OBJECTIVE: Vitals:   11/27/18 1000  BP: (!) 138/96  Temp: (!) 96.8 F (36 C)     Body mass index is 32.89 kg/m.    ECOG FS:0 - Asymptomatic  General: Well-developed, well-nourished, no acute distress. Eyes: Pink conjunctiva, anicteric sclera. HEENT: Normocephalic, moist mucous membranes. Lungs: Clear to auscultation bilaterally. Heart: Regular rate and rhythm. No rubs, murmurs, or gallops. Abdomen: Soft, nontender, nondistended. No organomegaly noted, normoactive bowel sounds.  Colostomy noted. Musculoskeletal: No edema, cyanosis, or clubbing. Neuro: Alert, answering all questions appropriately. Cranial nerves grossly intact.  Skin: No rashes or petechiae noted. Psych: Normal affect.  LAB RESULTS:  Lab Results  Component Value Date   NA 137 10/16/2018   K 4.1 10/16/2018   CL 104 10/16/2018   CO2 24 10/16/2018   GLUCOSE 122 (H) 10/16/2018   BUN 23 10/16/2018   CREATININE 1.05 10/16/2018   CALCIUM 9.0 10/16/2018   PROT 6.6 10/16/2018   ALBUMIN 4.0 10/16/2018   AST 31 10/16/2018   ALT 30 10/16/2018   ALKPHOS 72 10/16/2018   BILITOT 0.7 10/16/2018   GFRNONAA >60 10/16/2018   GFRAA >60 10/16/2018    Lab Results  Component Value Date   WBC 3.4 (L) 10/16/2018   NEUTROABS 1.8 10/16/2018   HGB 14.1 10/16/2018   HCT 42.6 10/16/2018   MCV 91.2 10/16/2018   PLT 246 10/16/2018     STUDIES: Nm Pet Image Restag (ps) Skull Base To Thigh  Result Date: 11/04/2018 CLINICAL DATA:  Subsequent  treatment strategy for rectal cancer. Previous hypermetabolic left upper lobe mass was biopsied with pathology revealing fungus ball. EXAM: NUCLEAR MEDICINE PET SKULL BASE TO THIGH TECHNIQUE: 13.3 mCi F-18 FDG was injected intravenously. Full-ring PET imaging was performed from the skull base to thigh after the radiotracer. CT data was obtained and used for attenuation correction and anatomic localization. Fasting blood glucose: 111 mg/dl COMPARISON:  06/24/2018 FINDINGS: Mediastinal blood pool activity: SUV max 2.7 Liver activity: SUV max NA NECK: No significant abnormal hypermetabolic activity in this region. Incidental CT findings: none CHEST: 7.6 by 6.6 cm left upper lobe lesion has faintly accentuated metabolic activity along its margins, maximum SUV 3.1, previously 5.0. The amount of marginal metabolic activity is significantly reduced. Upon biopsy there is no evidence of malignancy but there was fungal material. Incidental CT findings: Left Port-A-Cath tip: SVC. Mild atherosclerotic calcification of the aortic arch, left anterior descending, right, and circumflex coronary arteries. ABDOMEN/PELVIS: Accentuated activity in the ascending colon without definite CT correlate, maximum SUV 8.5. This is most likely to be physiologic activity. Small amount of activity along the expected location of the penile urethra, likely excreted urinary FDG. Incidental CT findings: Cholecystectomy. Descending and sigmoid colon diverticulosis. Sigmoid colostomy. Stable postoperative/post therapy presacral findings. Aortoiliac atherosclerotic vascular disease. Small nonobstructive bilateral renal calculi are suspected. SKELETON: No significant abnormal hypermetabolic activity in this region. Incidental CT findings: none IMPRESSION: 1. No findings of active or recurrent malignancy. 2. 7.6 by 6.6 cm left upper lobe lesion with reduced metabolic activity along its margins compared to prior, compatible with known fungus ball. 3.  Postoperative/post therapy related presacral findings without compelling evidence of recurrence. 4. Mildly accentuated activity in the ascending colon is without CT correlate and most likely physiologic. 5. Other imaging findings of potential clinical significance: Aortic Atherosclerosis (ICD10-I70.0). Coronary atherosclerosis. Sigmoid colostomy. Descending and sigmoid colon diverticulosis. Bilateral tiny nonobstructive renal calculi. Electronically Signed   By: Van Clines M.D.   On: 11/04/2018 12:58   Dg Chest Port 1 View  Result Date: 11/20/2018 CLINICAL DATA:  Post left-sided flexible bronchoscopy. EXAM: PORTABLE CHEST 1 VIEW COMPARISON:  Chest x-ray 07/29/2018 and PET-CT 11/04/2018 FINDINGS: Left subclavian Port-A-Cath with tip demonstrating interval advancement as tip is now just below the cavoatrial junction. Lungs are somewhat hypoinflated demonstrate no change in known biopsy-proven 9.7 cm fungus ball over the lingula. No evidence of effusion. Subtle prominence of the infrahilar vessels likely mild vascular congestion. Cardiomediastinal silhouette and remainder of the exam is unchanged. IMPRESSION: Suggestion mild vascular congestion. Stable known biopsy-proven 9.7 cm  fungus ball over the lingula. Electronically Signed   By: Marin Olp M.D.   On: 11/20/2018 15:43   Dg C-arm 1-60 Min-no Report  Result Date: 11/20/2018 Fluoroscopy was utilized by the requesting physician.  No radiographic interpretation.    ASSESSMENT: Recurrent, stage IV rectal adenocarcinoma.  PLAN:    1.  Recurrent, stage IV rectal adenocarcinoma: Patient was initially considered a clinical stage IIIa, but after completing neoadjuvant 5-FU and XRT followed by surgical excision on October 08, 2013 he was noted to be pathologic stage Ia.  He declined adjuvant FOLFOX at that time.  More recently, patient's CEA started trending up and CT of the chest revealed a large 7.7 cm left upper lobe mass biopsy consistent  with rectal adenocarcinoma.  Patient completed cycle 6 of FOLFOX plus Avastin on October 16, 2018.  Repeat biopsy did not reveal any evidence of malignancy, but persistent fungal infection.  No further chemotherapy is necessary, but patient will likely require surgery for removal of "fungus ball".  He will follow-up with pulmonary tomorrow to discuss details and referral to thoracic surgery.  No follow-up has been scheduled at this time.  Patient will likely require repeat imaging in approximately 3 to 4 months after his surgical resection.   2.  Leukopenia: Mild, monitor. 3.  Hyphae seen on biopsy: Serum galactomannan antigen is pending.  Likely surgery as above. 4.  Peripheral neuropathy: Resolved.  Monitor. 5.  Cough: Chronic and unchanged.  Continue Tessalon Perles as needed. 6.  Insomnia: Patient does not complain of this today.  Continue Ambien as needed.   Patient expressed understanding and was in agreement with this plan. He also understands that He can call clinic at any time with any questions, concerns, or complaints.    Lloyd Huger, MD   11/27/2018 11:58 AM

## 2018-11-26 ENCOUNTER — Other Ambulatory Visit: Payer: Self-pay

## 2018-11-26 ENCOUNTER — Encounter: Payer: Self-pay | Admitting: Oncology

## 2018-11-26 NOTE — Progress Notes (Signed)
Patient pre screened for office appointment, no questions or concerns today. 

## 2018-11-27 ENCOUNTER — Inpatient Hospital Stay: Payer: 59 | Attending: Oncology | Admitting: Oncology

## 2018-11-27 ENCOUNTER — Other Ambulatory Visit: Payer: Self-pay

## 2018-11-27 VITALS — BP 138/96 | Temp 96.8°F | Wt 263.1 lb

## 2018-11-27 DIAGNOSIS — Z79899 Other long term (current) drug therapy: Secondary | ICD-10-CM | POA: Diagnosis not present

## 2018-11-27 DIAGNOSIS — G4700 Insomnia, unspecified: Secondary | ICD-10-CM | POA: Insufficient documentation

## 2018-11-27 DIAGNOSIS — C20 Malignant neoplasm of rectum: Secondary | ICD-10-CM | POA: Diagnosis not present

## 2018-11-27 DIAGNOSIS — D72819 Decreased white blood cell count, unspecified: Secondary | ICD-10-CM | POA: Diagnosis not present

## 2018-11-27 DIAGNOSIS — R05 Cough: Secondary | ICD-10-CM | POA: Diagnosis not present

## 2018-11-27 DIAGNOSIS — R0600 Dyspnea, unspecified: Secondary | ICD-10-CM | POA: Diagnosis not present

## 2018-11-27 DIAGNOSIS — Z933 Colostomy status: Secondary | ICD-10-CM | POA: Diagnosis not present

## 2018-11-28 ENCOUNTER — Ambulatory Visit (INDEPENDENT_AMBULATORY_CARE_PROVIDER_SITE_OTHER): Payer: 59 | Admitting: Pulmonary Disease

## 2018-11-28 ENCOUNTER — Encounter: Payer: Self-pay | Admitting: Pulmonary Disease

## 2018-11-28 VITALS — BP 138/82 | HR 94 | Temp 97.3°F | Ht 75.0 in | Wt 266.4 lb

## 2018-11-28 DIAGNOSIS — R918 Other nonspecific abnormal finding of lung field: Secondary | ICD-10-CM | POA: Diagnosis not present

## 2018-11-28 DIAGNOSIS — B470 Eumycetoma: Secondary | ICD-10-CM

## 2018-11-28 DIAGNOSIS — Z933 Colostomy status: Secondary | ICD-10-CM

## 2018-11-28 DIAGNOSIS — Z23 Encounter for immunization: Secondary | ICD-10-CM

## 2018-11-28 DIAGNOSIS — C20 Malignant neoplasm of rectum: Secondary | ICD-10-CM | POA: Diagnosis not present

## 2018-11-28 NOTE — Progress Notes (Signed)
Subjective:    Patient ID: [name] [name], male    DOB: 1955-07-28, 63 y.o.   MRN: [id number]  HPI 63 year old lifelong never smoker history of rectal carcinoma of the anal verge and a left lung mass.  Call that he was first evaluated on 26 June 2018 for this left lung mass.  He underwent bronchoscopy for evaluation on 01 July 2018.  Pathology of the mass was consistent with adenocarcinoma of rectal origin.  There were also fungal elements noted.  Aspergillus antigen from BAL was negative.  Patient was treated for metastatic adenocarcinoma and subsequently a PET/CT on 04 November 2018 showed that the mass was persistent however the previously noted enhancing ring had decreased and was now without any FDG activity.  Mass is 7.4 to 7.6 cm in diameter.  Consistent with a fungus ball.  Patient was taken to operating room again on 20 October and he underwent inspection of the mass and rebiopsy to ensure that the adenocarcinoma has been cleared.  Shows that there is necrotic debris that can be seen in the left upper lobe and lingula bronchus.  The left lower lobe bronchi were clear.  Biopsies were obtained again these showed no recurrent adenocarcinoma.  No fungal elements were seen this time.  Cultures for fungus were sent.  Still pending.  It appears that most of the material is necrotic debris.  Patient continues to have a dry nonproductive cough however he has no hemoptysis.  No fevers, chills or sweats.  Presented today to discuss options.   Review of Systems A 10 point review of systems was performed and it is as noted above otherwise negative.    Objective:   Physical Exam Vitals signs and nursing note reviewed.  Constitutional:      Appearance: He is overweight. He is not ill-appearing or toxic-appearing.  HENT:     Head: Normocephalic and atraumatic.     Right Ear: External ear normal.     Left Ear: External ear normal.     Nose:     Comments: Nose mouth and throat: Deferred at present due to  masking  requirements for COVID-19. Eyes:     General: No scleral icterus.    Conjunctiva/sclera: Conjunctivae normal.     Pupils: Pupils are equal, round, and reactive to light.  Neck:     Musculoskeletal: Neck supple.     Thyroid: No thyromegaly.     Vascular: No JVD.     Trachea: Trachea and phonation normal.  Cardiovascular:     Rate and Rhythm: Normal rate and regular rhythm.     Pulses: Normal pulses.     Heart sounds: Normal heart sounds.  Pulmonary:     Effort: Pulmonary effort is normal. No respiratory distress.     Breath sounds: Normal breath sounds. No rhonchi.  Abdominal:     General: Abdomen is protuberant. The ostomy site is clean. There is no distension.     Comments: Left colostomy.  Musculoskeletal: Normal range of motion.     Right lower leg: No edema.     Left lower leg: No edema.  Skin:    General: Skin is warm and dry.  Neurological:     General: No focal deficit present.     Mental Status: He is alert and oriented to person, place, and time.  Psychiatric:        Mood and Affect: Mood normal.        Behavior: Behavior normal.  Assessment & Plan:   1. 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 This has been treated with chemotherapy and appears to be responding as it no longer enhances.  Repeat biopsy showed no evidence of adenocarcinoma. Most likely this is a residual fungal ball.  Fungal cultures pending however, there is extensive necrotic debris. Scant reports in the literature show some cavitary metastatic lesions that can subsequently be colonized with Aspergillus and become fungus balls.  If this is the case, the treatment should be surgical.   Treatment with voriconazole or itraconazole or similar antifungal generally is not effective.    Recommend referral to thoracic surgery (Dr. Genevive Bi) for evaluation of the  section of this mass.  Given that it is a mycetoma I do not believe that this is crossing the fissure and was likely just indenting on the fissure.  2. Rectal adenocarcinoma at the anal verge with metastatic disease to the chest: Patient completed chemotherapy.    There is no evidence of recurrence in the lung on repeat biopsies.    3.  Status post colostomy: Colostomy in situ.   All the above was discussed with the patient.  Follow-up here in 2 months time he is to contact us prior to that time should any new difficulties arise.   This chart was dictated using voice recognition software/Dragon.  Despite best efforts to proofread, errors can occur which can change the meaning.  Any change was purely unintentional.   C. Derrill Kay, MD [facility]

## 2018-11-28 NOTE — Patient Instructions (Signed)
1.  We will refer you to Dr. Genevive Bi for evaluation of surgery to remove the fungus ball.  2.  There is no evidence of cancer recurrence on the biopsies that we performed.  3.  We will see him in follow-up in 2 months time.

## 2018-12-02 ENCOUNTER — Encounter: Payer: Self-pay | Admitting: Cardiothoracic Surgery

## 2018-12-02 ENCOUNTER — Encounter: Payer: Self-pay | Admitting: Oncology

## 2018-12-02 ENCOUNTER — Telehealth: Payer: Self-pay | Admitting: Emergency Medicine

## 2018-12-02 ENCOUNTER — Ambulatory Visit (INDEPENDENT_AMBULATORY_CARE_PROVIDER_SITE_OTHER): Payer: 59 | Admitting: Cardiothoracic Surgery

## 2018-12-02 ENCOUNTER — Other Ambulatory Visit: Payer: Self-pay

## 2018-12-02 VITALS — BP 150/86 | HR 97 | Temp 97.2°F | Resp 12 | Ht 75.0 in | Wt 267.4 lb

## 2018-12-02 DIAGNOSIS — R918 Other nonspecific abnormal finding of lung field: Secondary | ICD-10-CM | POA: Diagnosis not present

## 2018-12-02 NOTE — Telephone Encounter (Signed)
Called patient to find out what questions he has in regards to his appointment with Dr. Genevive Bi. No answer, left message for him to call office back.

## 2018-12-02 NOTE — Progress Notes (Signed)
Patient ID: [name] [name], male   DOB: 1955-12-22, 63 y.o.   MRN: [id number]  Chief Complaint  Patient presents with  . New Patient (Initial Visit)    left lung mass    Referred By Dr. Patsey Berthold Reason for Referral left lung mass  HPI Location, Quality, Duration, Severity, Timing, Context, Modifying Factors, Associated Signs and Symptoms.  [name] [name] is a 63 y.o. male.  His problems actually began about 5 years ago when he underwent a colon resection for adenocarcinoma of the colon.  He did reasonably well until May of this year when he was noted to have a rising CEA.  A complete work-up at that time included a full body PET scan showing a large 7 cm left lung mass centered about the hilum.  He has undergone 2 separate bronchoscopic examinations with biopsies none of which have confirmed metastatic carcinoma.  He did receive additional chemotherapy for the clinical diagnosis of metastatic carcinoma and has now completed that.  His most recent bronchoscopy revealed fungal elements consistent with a fungus ball.  The patient is a lifelong non-smoker.  He occasionally will smoke a cigar.  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  He has no known risk factors for fungus infection.  He does not get short of breath unless he is undergoing strenuous activity.  He has an occasional cough and has been diagnosed with bronchitis in the past.   Past Medical History:  Diagnosis Date  . Anemia   . Cancer [facility])    rectal 2015, followed by Oncology, permanent colostomy  . Colostomy in place [facility])   . Dyspnea    on exertion  . History of kidney stones   . Pneumonia   . Seasonal allergies     Past Surgical History:  Procedure Laterality Date  . BACK SURGERY    . CHOLECYSTECTOMY    . COLON SURGERY    . ENDOBRONCHIAL ULTRASOUND N/A 07/01/2018   Procedure: ENDOBRONCHIAL ULTRASOUND;  Surgeon: Tyler Pita, MD;  Location: [facility] ORS;  Service: Cardiopulmonary;  Laterality: N/A;  . FLEXIBLE BRONCHOSCOPY  Left 11/20/2018   Procedure: FLEXIBLE BRONCHOSCOPY;  Surgeon: Tyler Pita, MD;  Location: [facility] ORS;  Service: Cardiopulmonary;  Laterality: Left;  . KNEE SURGERY Bilateral   . PORT A CATH REVISION Left 08/05/2018   Procedure: PORT A CATH REVISION, REPOSITION LEFT;  Surgeon: Robert Bellow, MD;  Location: [facility] ORS;  Service: General;  Laterality: Left;  . PORT-A-CATH REMOVAL Right 12/02/2014   Procedure: REMOVAL PORT-A-CATH;  Surgeon: Marlyce Huge, MD;  Location: [facility] ORS;  Service: General;  Laterality: Right;  . PORTACATH PLACEMENT    . PORTACATH PLACEMENT Left 07/29/2018   Procedure: INSERTION PORT-A-CATH LEFT;  Surgeon: Robert Bellow, MD;  Location: [facility] ORS;  Service: General;  Laterality: Left;    Family History  Problem Relation Age of Onset  . Cancer Mother   . Hypertension Mother   . COPD Mother   . COPD Father   . Cancer Father        Prostate CA, on Lupron  . Stroke Maternal Grandmother   . Cancer Paternal Grandmother   . Heart attack Paternal Grandfather   . Cancer Paternal Grandfather     Social History Social History   Tobacco Use  . Smoking status: Never Smoker  . Smokeless tobacco: Current User    Types: Snuff  . Tobacco comment: might smoke a cigar once every 3 months  Substance Use Topics  . Alcohol  use: Yes    Alcohol/week: 0.0 standard drinks    Comment: RARE  . Drug use: No    Allergies  Allergen Reactions  . Sulfa Antibiotics Hives    Current Outpatient Medications  Medication Sig Dispense Refill  . albuterol (VENTOLIN HFA) 108 (90 Base) MCG/ACT inhaler Inhale 2 puffs into the lungs every 6 (six) hours as needed for wheezing or shortness of breath. 18 g 1  . cetirizine (ZYRTEC) 10 MG tablet Take 10 mg by mouth at bedtime.     . chlorpheniramine-HYDROcodone (TUSSIONEX PENNKINETIC ER) 10-8 MG/5ML SUER Take 5 mLs by mouth every 8 (eight) hours as needed for cough. 115 mL 0  . Dextromethorphan-guaiFENesin (MUCINEX DM MAXIMUM  STRENGTH) 60-1200 MG TB12 Take 1 tablet by mouth at bedtime as needed (congestion/drainage.).    [name] loperamide (IMODIUM A-D) 2 MG capsule Take 2 mg by mouth as needed for diarrhea or loose stools.     . Multiple Vitamin (MULTIVITAMIN WITH MINERALS) TABS tablet Take 1 tablet by mouth at bedtime.    [name] NEEDLE, DISP, 18 G 18G X 1" MISC Use as directed 50 each 0  . ondansetron (ZOFRAN) 8 MG tablet Take 1 tablet (8 mg total) by mouth 2 (two) times daily as needed for refractory nausea / vomiting. 30 tablet 2  . Oxymetazoline HCl (AFRIN EXTRA MOISTURIZING NA) Place 1 spray into the nose 2 (two) times daily as needed (congestion.).    [name] prochlorperazine (COMPAZINE) 10 MG tablet Take 1 tablet (10 mg total) by mouth every 6 (six) hours as needed (Nausea or vomiting). 60 tablet 2  . SYRINGE-NEEDLE, DISP, 3 ML (LUER LOCK SAFETY SYRINGES) 21G X 1-1/2" 3 ML MISC Use as directed for testosterone administration 50 each 0  . tadalafil (ADCIRCA/CIALIS) 20 MG tablet Take 1 tablet (20 mg total) by mouth daily as needed for erectile dysfunction. 6 tablet 10  . timolol (BETIMOL) 0.5 % ophthalmic solution Place 1 drop into the right eye at bedtime.      No current facility-administered medications for this visit.       Review of Systems A complete review of systems was asked and was negative except for the following positive findings bronchitis, shortness of breath, kidney stones, diarrhea, glasses, glaucoma  Blood pressure (!) 150/86, pulse 97, temperature (!) 97.2 F (36.2 C), temperature source Temporal, resp. rate 12, height _0  (1.905 m), weight 267 lb 6.4 oz (121.3 kg), SpO2 97 %.  Physical Exam CONSTITUTIONAL:  Pleasant, well-developed, well-nourished, and in no acute distress. EYES: Pupils equal and reactive to light, Sclera non-icteric EARS, NOSE, MOUTH AND THROAT:  The oropharynx was clear.  Dentition is good repair.  Oral mucosa pink and moist. LYMPH NODES:  Lymph nodes in the neck and axillae were  normal RESPIRATORY:  Lungs were clear.  Normal respiratory effort without pathologic use of accessory muscles of respiration CARDIOVASCULAR: Heart was regular without murmurs.  There were no carotid bruits. GI: The abdomen was soft, nontender, and nondistended. There were no palpable masses. There was no hepatosplenomegaly. There were normal bowel sounds in all quadrants. GU:  Rectal deferred.  There is a stoma in the left lower quadrant. MUSCULOSKELETAL:  Normal muscle strength and tone.  No clubbing or cyanosis.   SKIN:  There were no pathologic skin lesions.  There were no nodules on palpation. NEUROLOGIC:  Sensation is normal.  Cranial nerves are grossly intact. PSYCH:  Oriented to person, place and time.  Mood and affect are normal.  Data  Reviewed Multiple CT scans and chest x-rays.  Multiple PET scans  I have personally reviewed the patient's imaging, laboratory findings and medical records.    Assessment    I have independently reviewed the patient's CT scan and PET scans.  There is a large left hilar mass.  I believe that this is most consistent with a fungus.  It is encroaching on the main pulmonary artery.    Plan    Given the extent of his tumor I believe that he would be best served over at [facility]  I have made an appointment for him to see Dr. Ceasar Mons.  I believe that the tumor may involve the pulmonary artery and/or the fissure requiring a left pneumonectomy.       Nestor Lewandowsky, MD 12/02/2018, 9:23 AM

## 2018-12-02 NOTE — Telephone Encounter (Signed)
Pt called back and stated that he had seen Dr. Genevive Bi today and Dr. Genevive Bi told him that he couldn't find anything in the notes about him having lung cancer. Pt has questions regarding clarification of why Dr. Genevive Bi told him this and if this is true why he went through chemo this year. Pt also passed on information that he will be going to see Dr. Roxy Horseman tomorrow, as Dr. Genevive Bi is unable to perform his surgery at [facility]. Dr. Grayland Ormond told of pt's questions and concerns. Pt understands that he can call the office should any further questions or concerns arise.

## 2018-12-02 NOTE — Patient Instructions (Addendum)
Please call if you have any questions or concerns.  Referral sent to Dr.Edward Gearhart in [place]. Someone from their office will call to schedule the appointment within 5-7 days.   [place]  [place]   [place]

## 2018-12-03 ENCOUNTER — Other Ambulatory Visit: Payer: Self-pay | Admitting: Cardiothoracic Surgery

## 2018-12-03 ENCOUNTER — Telehealth: Payer: Self-pay

## 2018-12-03 ENCOUNTER — Other Ambulatory Visit: Payer: Self-pay

## 2018-12-03 ENCOUNTER — Institutional Professional Consult (permissible substitution) (INDEPENDENT_AMBULATORY_CARE_PROVIDER_SITE_OTHER): Payer: 59 | Admitting: Cardiothoracic Surgery

## 2018-12-03 VITALS — BP 165/80 | HR 100 | Temp 97.8°F | Resp 20 | Ht 75.0 in | Wt 267.0 lb

## 2018-12-03 DIAGNOSIS — R918 Other nonspecific abnormal finding of lung field: Secondary | ICD-10-CM

## 2018-12-03 NOTE — Telephone Encounter (Signed)
Referral received. Patient scheduled 12/03/2018 @ 3 pm to see Dr.Gearhart.

## 2018-12-04 ENCOUNTER — Other Ambulatory Visit: Payer: Self-pay | Admitting: Cardiothoracic Surgery

## 2018-12-04 DIAGNOSIS — R918 Other nonspecific abnormal finding of lung field: Secondary | ICD-10-CM

## 2018-12-04 NOTE — Progress Notes (Signed)
[address]       [place] [address]             [phone]                    Bren L Honaker [place] Medical Record #[id number] Date of Birth: 06-17-1955  Referring: Nestor Lewandowsky, MD Primary Care: Hubbard Hartshorn, FNP Primary Cardiologist: No primary care provider on file.  Chief Complaint:    Chief Complaint  Patient presents with   Lung Mass    Surgical eval, PET Scan 11/04/18, C/A/P CT 06/13/18    History of Present Illness:    [name] [name] 63 y.o. male is seen in the office  today for evaluation of left hilar mass.  The patient presented in April 2015 with a rectal carcinoma, PET scan at that time suggested a large mass at the anal verge extending into the perineal fat with nodal involvement of what right inguinal lymph node and mesorectal lymph nodes.  PET scan in 2015 showed no abnormalities in the chest per report.  He was treated with preoperative radiation and chemotherapy, underwent robot-assisted abdominal perineal resection.  Final pathology showed 1.5 cm tumor, 3 lymph examined all negative, pathologic staging  pT2pN0 . Patient CEA has gone from 3.0 in October 2018 to 5.9 in September 2020. Because of complaint of cough in March 2020 and rising CEA, CT of the chest abdomen and pelvis was performed and subsequent PET scan June 2020.  CT and PET scan showed a large left hilar mass.  Bronchoscopy with biopsy had positive cytology consistent with adenocarcinoma of the colon.  Pathologic biopsy was negative for tumor, but did show:"SEPTATE AND BRANCHING FUNGAL HYPHAE, CONSISTENT WITH FUNGUS BALL".  Subsequent KOH preps and cultures have been negative for fungus.  the patient subsequently had a Port-A-Cath placed and underwent chemotherapy treatment for stage IV colon cancer.   Follow-up PET scan shows persistent left hilar mass with little change in size.  7.6 x 6.6 cm in size with attenuated metabolic activity along the margins SUV 3.1 previously  5.0.   Patient is referred to thoracic surgery for consideration of pneumonectomy.    Patient is a lifelong cigarette non-smoker, he notes occasional cigar and chews tobacco.  He denies any known cardiac history.  He does note some exertional shortness of breath but is able to carry on usual daily activities without symptoms.    Cancer Staging Rectal cancer [facility]) Staging form: Colon and Rectum, AJCC 7th Edition - Clinical stage from 08/05/2014: Stage IVA (T2, N0, M1a) - Signed by Lloyd Huger, MD on 07/06/2018  Current Activity/ Functional Status:  Patient is independent with mobility/ambulation, transfers, ADL's, IADL's.   Zubrod Score: At the time of surgery this patients most appropriate activity status/level should be described as: _0     0    Normal activity, no symptoms _1     1    Restricted in physical strenuous activity but ambulatory, able to do out light work _2     2    Ambulatory and capable of self care, unable to do work activities, up and about               >50 % of waking hours                              _3     3  Only limited self care, in bed greater than 50% of waking hours _0     4    Completely disabled, no self care, confined to bed or chair _1     5    Moribund   Past Medical History:  Diagnosis Date   Anemia    Cancer ([place])    rectal 2015, followed by Oncology, permanent colostomy   Colostomy in place [facility])    Dyspnea    on exertion   History of kidney stones    Pneumonia    Seasonal allergies     Past Surgical History:  Procedure Laterality Date   BACK SURGERY     CHOLECYSTECTOMY     COLON SURGERY     ENDOBRONCHIAL ULTRASOUND N/A 07/01/2018   Procedure: ENDOBRONCHIAL ULTRASOUND;  Surgeon: Tyler Pita, MD;  Location: [facility] ORS;  Service: Cardiopulmonary;  Laterality: N/A;   FLEXIBLE BRONCHOSCOPY Left 11/20/2018   Procedure: FLEXIBLE BRONCHOSCOPY;  Surgeon: Tyler Pita, MD;  Location: [facility] ORS;  Service:  Cardiopulmonary;  Laterality: Left;   KNEE SURGERY Bilateral    PORT A CATH REVISION Left 08/05/2018   Procedure: PORT A CATH REVISION, REPOSITION LEFT;  Surgeon: Robert Bellow, MD;  Location: [facility] ORS;  Service: General;  Laterality: Left;   PORT-A-CATH REMOVAL Right 12/02/2014   Procedure: REMOVAL PORT-A-CATH;  Surgeon: Marlyce Huge, MD;  Location: [facility] ORS;  Service: General;  Laterality: Right;   PORTACATH PLACEMENT     PORTACATH PLACEMENT Left 07/29/2018   Procedure: INSERTION PORT-A-CATH LEFT;  Surgeon: Robert Bellow, MD;  Location: [facility] ORS;  Service: General;  Laterality: Left;    Family History  Problem Relation Age of Onset   Cancer Mother    Hypertension Mother    COPD Mother    COPD Father    Cancer Father        Prostate CA, on Lupron   Stroke Maternal Grandmother    Cancer Paternal Grandmother    Heart attack Paternal Grandfather    Cancer Paternal Grandfather      Social History   Tobacco Use  Smoking Status Never Smoker  Smokeless Tobacco Current User   Types: Snuff  Tobacco Comment   might smoke a cigar once every 3 months    Social History   Substance and Sexual Activity  Alcohol Use Yes   Alcohol/week: 0.0 standard drinks   Comment: RARE     Allergies  Allergen Reactions   Sulfa Antibiotics Hives    Current Outpatient Medications  Medication Sig Dispense Refill   albuterol (VENTOLIN HFA) 108 (90 Base) MCG/ACT inhaler Inhale 2 puffs into the lungs every 6 (six) hours as needed for wheezing or shortness of breath. 18 g 1   cetirizine (ZYRTEC) 10 MG tablet Take 10 mg by mouth at bedtime.      chlorpheniramine-HYDROcodone (TUSSIONEX PENNKINETIC ER) 10-8 MG/5ML SUER Take 5 mLs by mouth every 8 (eight) hours as needed for cough. 115 mL 0   Dextromethorphan-guaiFENesin (MUCINEX DM MAXIMUM STRENGTH) 60-1200 MG TB12 Take 1 tablet by mouth at bedtime as needed (congestion/drainage.).     loperamide (IMODIUM A-D) 2 MG  capsule Take 2 mg by mouth as needed for diarrhea or loose stools.      Multiple Vitamin (MULTIVITAMIN WITH MINERALS) TABS tablet Take 1 tablet by mouth at bedtime.     NEEDLE, DISP, 18 G 18G X 1" MISC Use as directed 50 each 0   ondansetron (ZOFRAN) 8 MG tablet Take 1 tablet (8 mg total)  by mouth 2 (two) times daily as needed for refractory nausea / vomiting. 30 tablet 2   Oxymetazoline HCl (AFRIN EXTRA MOISTURIZING NA) Place 1 spray into the nose 2 (two) times daily as needed (congestion.).     prochlorperazine (COMPAZINE) 10 MG tablet Take 1 tablet (10 mg total) by mouth every 6 (six) hours as needed (Nausea or vomiting). 60 tablet 2   SYRINGE-NEEDLE, DISP, 3 ML (LUER LOCK SAFETY SYRINGES) 21G X 1-1/2" 3 ML MISC Use as directed for testosterone administration 50 each 0   tadalafil (ADCIRCA/CIALIS) 20 MG tablet Take 1 tablet (20 mg total) by mouth daily as needed for erectile dysfunction. 6 tablet 10   timolol (BETIMOL) 0.5 % ophthalmic solution Place 1 drop into the right eye at bedtime.      No current facility-administered medications for this visit.     Pertinent items are noted in HPI.   Review of Systems:     Cardiac Review of Systems: [Y] = yes  or   [ N ] = no   Chest Pain [ n   ]  Resting SOB [ n  ] Exertional SOB  [ y ]  Vertell Limber Florencio.Farrier  ]   Pedal Edema [ n  ]    Palpitations [ n ] Syncope  [ n ]   Presyncope [ n  ]   General Review of Systems: [Y] = yes [  ]=no Constitional: recent weight change [  ];  Wt loss over the last 3 months [   ] anorexia [  ]; fatigue [  ]; nausea [  ]; night sweats [  ]; fever [  ]; or chills [  ];           Eye : blurred vision [  ]; diplopia [   ]; vision changes [  ];  Amaurosis fugax[  ]; Resp: cough [  ];  wheezing[  ];  hemoptysis[  ]; shortness of breath[  ]; paroxysmal nocturnal dyspnea[  ]; dyspnea on exertion[  ]; or orthopnea[  ];  GI:  gallstones[  ], vomiting[  ];  dysphagia[  ]; melena[  ];  hematochezia [  ]; heartburn[  ];   Hx of   Colonoscopy[  ]; GU: kidney stones [  ]; hematuria[  ];   dysuria [  ];  nocturia[  ];  history of     obstruction [  ]; urinary frequency [  ]             Skin: rash, swelling[  ];, hair loss[  ];  peripheral edema[  ];  or itching[  ]; Musculosketetal: myalgias[  ];  joint swelling[  ];  joint erythema[  ];  joint pain[  ];  back pain[  ];  Heme/Lymph: bruising[  ];  bleeding[  ];  anemia[  ];  Neuro: TIA[  ];  headaches[  ];  stroke[  ];  vertigo[  ];  seizures[  ];   paresthesias[  ];  difficulty walking[  ];  Psych:depression[  ]; anxiety[  ];  Endocrine: diabetes[  ];  thyroid dysfunction[  ];  Immunizations: Flu up to date [  ]; Pneumococcal up to date [  ];  Other:     PHYSICAL EXAMINATION: BP (!) 165/80    Pulse 100    Temp 97.8 F (36.6 C) (Skin)    Resp 20    Ht _0  (1.905 m)    Wt 121.1 kg  SpO2 95% Comment: RA   BMI 33.37 kg/m  General appearance: alert, cooperative, appears stated age and no distress Head: Normocephalic, without obvious abnormality, atraumatic Neck: no adenopathy, no carotid bruit, no JVD, supple, symmetrical, trachea midline, thyroid not enlarged, symmetric, no tenderness/mass/nodules and portacath in place, not infected  Lymph nodes: Cervical, supraclavicular, and axillary nodes normal. Resp: clear to auscultation bilaterally Back: symmetric, no curvature. ROM normal. No CVA tenderness. Cardio: regular rate and rhythm, S1, S2 normal, no murmur, click, rub or gallop GI: soft, non-tender; bowel sounds normal; no masses,  no organomegaly and Colostomy left lower quadrant Extremities: extremities normal, atraumatic, no cyanosis or edema Neurologic: Grossly normal  Diagnostic Studies & Laboratory data:     Recent Radiology Findings:   Nm Pet Image Restag (ps) Skull Base To Thigh  Result Date: 11/04/2018 CLINICAL DATA:  Subsequent treatment strategy for rectal cancer. Previous hypermetabolic left upper lobe mass was biopsied with pathology revealing  fungus ball. EXAM: NUCLEAR MEDICINE PET SKULL BASE TO THIGH TECHNIQUE: 13.3 mCi F-18 FDG was injected intravenously. Full-ring PET imaging was performed from the skull base to thigh after the radiotracer. CT data was obtained and used for attenuation correction and anatomic localization. Fasting blood glucose: 111 mg/dl COMPARISON:  06/24/2018 FINDINGS: Mediastinal blood pool activity: SUV max 2.7 Liver activity: SUV max NA NECK: No significant abnormal hypermetabolic activity in this region. Incidental CT findings: none CHEST: 7.6 by 6.6 cm left upper lobe lesion has faintly accentuated metabolic activity along its margins, maximum SUV 3.1, previously 5.0. The amount of marginal metabolic activity is significantly reduced. Upon biopsy there is no evidence of malignancy but there was fungal material. Incidental CT findings: Left Port-A-Cath tip: SVC. Mild atherosclerotic calcification of the aortic arch, left anterior descending, right, and circumflex coronary arteries. ABDOMEN/PELVIS: Accentuated activity in the ascending colon without definite CT correlate, maximum SUV 8.5. This is most likely to be physiologic activity. Small amount of activity along the expected location of the penile urethra, likely excreted urinary FDG. Incidental CT findings: Cholecystectomy. Descending and sigmoid colon diverticulosis. Sigmoid colostomy. Stable postoperative/post therapy presacral findings. Aortoiliac atherosclerotic vascular disease. Small nonobstructive bilateral renal calculi are suspected. SKELETON: No significant abnormal hypermetabolic activity in this region. Incidental CT findings: none IMPRESSION: 1. No findings of active or recurrent malignancy. 2. 7.6 by 6.6 cm left upper lobe lesion with reduced metabolic activity along its margins compared to prior, compatible with known fungus ball. 3. Postoperative/post therapy related presacral findings without compelling evidence of recurrence. 4. Mildly accentuated activity  in the ascending colon is without CT correlate and most likely physiologic. 5. Other imaging findings of potential clinical significance: Aortic Atherosclerosis (ICD10-I70.0). Coronary atherosclerosis. Sigmoid colostomy. Descending and sigmoid colon diverticulosis. Bilateral tiny nonobstructive renal calculi. Electronically Signed   By: Van Clines M.D.   On: 11/04/2018 12:58    Dg Chest Port 1 View  Result Date: 11/20/2018 CLINICAL DATA:  Post left-sided flexible bronchoscopy. EXAM: PORTABLE CHEST 1 VIEW COMPARISON:  Chest x-ray 07/29/2018 and PET-CT 11/04/2018 FINDINGS: Left subclavian Port-A-Cath with tip demonstrating interval advancement as tip is now just below the cavoatrial junction. Lungs are somewhat hypoinflated demonstrate no change in known biopsy-proven 9.7 cm fungus ball over the lingula. No evidence of effusion. Subtle prominence of the infrahilar vessels likely mild vascular congestion. Cardiomediastinal silhouette and remainder of the exam is unchanged. IMPRESSION: Suggestion mild vascular congestion. Stable known biopsy-proven 9.7 cm fungus ball over the lingula. Electronically Signed   By: Marin Olp M.D.  On: 11/20/2018 15:43   Dg C-arm 1-60 Min-no Report  Result Date: 11/20/2018 Fluoroscopy was utilized by the requesting physician.  No radiographic interpretation.     I have independently reviewed the above radiology studies  and reviewed the findings with the patient.    PATH: Cytology - Non PAP CASE: [id number] PATIENT: [name] [name] Non-Gyn Cytology Report  SPECIMEN SUBMITTED: A. Lung, left upper lobe; brushing  CLINICAL HISTORY: 63 year old male with rectal adenocarcinoma treated with chemoradiation and surgery in 2015 who presents with rising CEA and new LUL mass  PRE-OPERATIVE DIAGNOSIS: Metastatic colorectal carcinoma  POST-OPERATIVE DIAGNOSIS: Same as above   DIAGNOSIS: A.  LUNG, LEFT UPPER LOBE; BRONCHOSCOPY WITH BRUSHING: - POSITIVE  FOR MALIGNANCY. - ADENOCARCINOMA CONSISTENT WITH METASTATIC COLORECTAL CARCINOMA IS PRESENT.  See concurrent cases [id number], -[id number], and [id number].  There is insufficient material for ancillary molecular testing.  Comment: Sections demonstrate malignant epithelial groups floating within pools of mucin.  Limited panel of immunohistochemical stains was performed with the following pattern of results: Cytokeratin 7: Positive Cytokeratin 20: Positive TTF-1: Negative CDX 2: Posit ive This pattern of immunoreactivity supports the above diagnosis. These findings were communicated to Drs. Odetta Pink on 07/04/2018 via [facility] secure text.  IHC slides were prepared by [facility], [place], [place]. All controls stained appropriately.  This test was developed and its performance characteristics determined by [facility]. It has not been cleared or approved by the [place] Food and Drug Administration. The FDA does not require this test to go through premarket FDA review. This test is used for clinical purposes. It should not be regarded as investigational or for research. This laboratory is certified under the Clinical Laboratory Improvement Amendments (CLIA) as qualified to perform high complexity clinical laboratory testing.    GROSS DESCRIPTION: A. Site: Lung, left upper lobe Procedure: Bronchoscopy Cytotechnologist: Ashlee Howze-Soremekun and Marybeth Anderson Specimen(s) collected: 4 Diff Quik stained slides Spec imen labeled: Left upper lobe, brush                      Volume: 15 mL                      Description: Clear CytoLyt solution admixed with wispy tissue fragments and 1 collection brush                      Submitted for: ThinPrep and cell block   Final Diagnosis performed by Quay Burow, MD.   Electronically signed 07/04/2018 2:34:08PM The electronic signature indicates that the named Attending Pathologist has evaluated the specimen   Technical component performed at [facility], [address], [place], [place] [address] Lab: [phone] Dir: Rush Farmer, MD, MMM  Professional component performed at [facility], [facility], [place], [place], [place] [address] Lab: [phone] Dir: Dellia Nims. Reuel Derby, MD    Surgical Pathology  CASE: [id number]  PATIENT: [name] [name]  Surgical Pathology Report    SPECIMEN SUBMITTED:  A. Lung, left upper lobe   CLINICAL HISTORY:  63 year old male with rectal adenocarcinoma treated with chemoradiation  and surgery in 2015 who presents with rising CEA and new LUL mass   PRE-OPERATIVE DIAGNOSIS:  Metastatic colorectal carcinoma   POST-OPERATIVE DIAGNOSIS:  Same as above    DIAGNOSIS:  A. LUNG, LEFT UPPER LOBE; BRONCHOSCOPY WITH BIOPSY:  - SEPTATE AND BRANCHING FUNGAL HYPHAE, CONSISTENT WITH FUNGUS BALL.  - FRAGMENTS OF BENIGN RESPIRATORY MUCOSA.  -  NEGATIVE FOR MALIGNANCY.   See concurrent cases [id number], -[id number], and -281.   Comment:  Preliminary findings were communicated to Drs. Patsey Berthold and Grayland Ormond via  [facility] secure text on 07/02/2018 at 9:42 AM. GMS stain was performed and  fails to identify invasive disease. A CDX-2 stain was performed and is  negative.   GROSS DESCRIPTION:  A. Labeled: Left upper lobe, biopsy  Received: The specimen is retrieved via bronchoscopy and placed into  formalin.  Tissue fragment(s): Multiple  Size: Aggregate, 1.0 x 0.5 x 0.1 cm  Description: Received are irregular fragments of tan-pink soft tissue  admixed with minimal, hemorrhagic material. At the time of procedure, 2  Diff-Quik stained slides are performed. The remainder of the specimen  is filtered into a mesh bag and submitted for routine histology in  cassette 1.    Final Diagnosis performed by Quay Burow, MD.  Electronically signed  07/04/2018 12:34:35PM  The electronic signature indicates that the named Attending Pathologist  has evaluated the  specimen  Technical component performed at [facility], [address] [place],  [place] [address] Lab: [phone] Dir: Rush Farmer, MD, MMM Professional  component performed at [facility], [facility], [address] [place], [place], [place] [address] Lab: [phone] Dir: Dellia Nims.  Reuel Derby, MD    Recent Lab Findings: Lab Results  Component Value Date   WBC 3.4 (L) 10/16/2018   HGB 14.1 10/16/2018   HCT 42.6 10/16/2018   PLT 246 10/16/2018   GLUCOSE 122 (H) 10/16/2018   CHOL 147 03/22/2018   TRIG 126 03/22/2018   HDL 35 (L) 03/22/2018   LDLCALC 89 03/22/2018   ALT 30 10/16/2018   AST 31 10/16/2018   NA 137 10/16/2018   K 4.1 10/16/2018   CL 104 10/16/2018   CREATININE 1.05 10/16/2018   BUN 23 10/16/2018   CO2 24 10/16/2018   TSH 3.96 07/03/2016   INR 1.0 05/08/2013      Assessment / Plan:   Stage IV adenocarcinoma of the colon metastatic to the left lung, left hilum with little change in size with chemotherapy. -Although the PET scan does not indicate other metastasis I would be reluctant to proceed with pneumonectomy for metastatic colon cancer.  I reviewed the pathology results, cytology results and imaging with the patient and his wife.  We we will obtain pulmonary function studies, reviewed the patient's pathology and imaging at the multidisciplinary thoracic oncology conference.  Following this I will see the patient back and make a final decision about considering surgical resection.     I  spent 40 minutes with  the patient face to face and greater then 50% of the time was spent in counseling and coordination of care.    Grace Isaac MD      [address] [place] [address] Office [phone]     12/04/2018 9:19 AM

## 2018-12-05 MED FILL — TADALAFIL 20 MG TABS: 20 | 30 days supply | Qty: 6 | Fill #5

## 2018-12-09 ENCOUNTER — Encounter: Payer: Self-pay | Admitting: Oncology

## 2018-12-10 ENCOUNTER — Encounter: Payer: Self-pay | Admitting: Internal Medicine

## 2018-12-10 ENCOUNTER — Ambulatory Visit (INDEPENDENT_AMBULATORY_CARE_PROVIDER_SITE_OTHER): Payer: 59 | Admitting: Internal Medicine

## 2018-12-10 ENCOUNTER — Other Ambulatory Visit: Payer: Self-pay | Admitting: Emergency Medicine

## 2018-12-10 ENCOUNTER — Other Ambulatory Visit: Payer: Self-pay

## 2018-12-10 DIAGNOSIS — R918 Other nonspecific abnormal finding of lung field: Secondary | ICD-10-CM

## 2018-12-10 DIAGNOSIS — B49 Unspecified mycosis: Secondary | ICD-10-CM | POA: Diagnosis not present

## 2018-12-10 DIAGNOSIS — R059 Cough, unspecified: Secondary | ICD-10-CM

## 2018-12-10 DIAGNOSIS — R05 Cough: Secondary | ICD-10-CM

## 2018-12-10 MED ORDER — VORICONAZOLE 200 MG PO TABS: 200.0000 mg | ORAL_TABLET | Freq: Two times a day (BID) | ORAL | 1 refills | Status: DC

## 2018-12-10 MED ORDER — HYDROCOD POLST-CPM POLST ER 10-8 MG/5ML PO SUER: 5.0000 mL | Freq: Three times a day (TID) | ORAL | 0 refills | Status: DC | PRN

## 2018-12-10 NOTE — Progress Notes (Signed)
[facility]      Reason for Consult: possible fungal lung mass    Referring Physician: Dr. Servando Snare    Patient ID: [name] [name], male    DOB: November 28, 1955, 63 y.o.   MRN: [id number]  HPI:   Here for evaluation of a possible lung fungal ball.  He initially had rectal carcinoma in April 2015 and was treated preoperative radiation and chemotherapy and then robot-assisted abdominal perineal resection.  In May he complained of a cough and had a rise in his CEA and he underwent CT then PET in June 2020.  On this scan, necrotic mass of the left upper lobe was noted and c/w metastatic carcinoma.  He underwent bronchoscopy with biopsy and pathology with a cytology of adenocarcinoma of the colon.  It also noted septate and branching fungal hyphae c/w fungal ball.  Cultures and stains and aspergillus Ag in the BAL otherwise though have remained negative for fungal growth.  This was also negative in the serum.  He then underwent 6 cycles of FOLFOX plus Avastin which he has completed.  A follow up PET scan still noted a large hilar mass 7.6 by 6.6 cm but with reduced metabolic activity along its margins. He then was referred to Dr. Servando Snare for evaluation for possible resection who referred him to me for consideration of treatment.   CT independently reviewed and mass noted, no cavitation.  Previous record reviewed in Epic and partially summarized above.   Past Medical History:  Diagnosis Date  . Anemia   . Cancer [facility])    rectal 2015, followed by Oncology, permanent colostomy  . Colostomy in place [facility])   . Dyspnea    on exertion  . History of kidney stones   . Pneumonia   . Seasonal allergies     Prior to Admission medications   Medication Sig Start Date End Date Taking? Authorizing Provider  albuterol (VENTOLIN HFA) 108 (90 Base) MCG/ACT inhaler Inhale 2 puffs into the lungs every 6 (six) hours as needed for wheezing or shortness of breath. 11/21/18   Lloyd Huger, MD  cetirizine (ZYRTEC) 10 MG tablet Take 10 mg by mouth at bedtime.     [provider]  chlorpheniramine-HYDROcodone (TUSSIONEX PENNKINETIC ER) 10-8 MG/5ML SUER Take 5 mLs by mouth every 8 (eight) hours as needed for cough. 12/10/18   Lloyd Huger, MD  Dextromethorphan-guaiFENesin (MUCINEX DM MAXIMUM STRENGTH) 60-1200 MG TB12 Take 1 tablet by mouth at bedtime as needed (congestion/drainage.).    [provider]  loperamide (IMODIUM A-D) 2 MG capsule Take 2 mg by mouth as needed for diarrhea or loose stools.     [provider]  Multiple Vitamin (MULTIVITAMIN WITH MINERALS) TABS tablet Take 1 tablet by mouth at bedtime.    [provider]  NEEDLE, DISP, 18 G 18G X 1" MISC Use as directed 06/12/18   Stoioff, Ronda Fairly, MD  ondansetron (ZOFRAN) 8 MG tablet Take 1 tablet (8 mg total) by mouth 2 (two) times daily as needed for refractory nausea / vomiting. 07/17/18   Lloyd Huger, MD  Oxymetazoline HCl (AFRIN EXTRA MOISTURIZING NA) Place 1 spray into the nose 2 (two) times daily as needed (congestion.).    [provider]  prochlorperazine (COMPAZINE) 10 MG tablet Take 1 tablet (10 mg total) by mouth every 6 (six) hours as needed (Nausea or vomiting). 07/17/18   Lloyd Huger, MD  SYRINGE-NEEDLE, DISP, 3 ML (Sweet Grass  SYRINGES) 21G X 1-1/2" 3 ML MISC Use as directed for testosterone administration 06/12/18   Stoioff, Ronda Fairly, MD  tadalafil (ADCIRCA/CIALIS) 20 MG tablet Take 1 tablet (20 mg total) by mouth daily as needed for erectile dysfunction. 12/14/17   Stoioff, Ronda Fairly, MD  timolol (BETIMOL) 0.5 % ophthalmic solution Place 1 drop into the right eye at bedtime.     [provider]    Allergies  Allergen Reactions  . Sulfa Antibiotics Hives    Social History   Tobacco Use  . Smoking status: Never Smoker  . Smokeless tobacco: Current User    Types: Snuff  . Tobacco comment: might smoke a cigar once every 3  months  Substance Use Topics  . Alcohol use: Yes    Alcohol/week: 0.0 standard drinks    Comment: RARE  . Drug use: No    Family History  Problem Relation Age of Onset  . Cancer Mother   . Hypertension Mother   . COPD Mother   . COPD Father   . Cancer Father        Prostate CA, on Lupron  . Stroke Maternal Grandmother   . Cancer Paternal Grandmother   . Heart attack Paternal Grandfather   . Cancer Paternal Grandfather      Review of Systems  Constitutional: negative for fevers and chills Respiratory: positive for cough Integument/breast: negative for rash Hematologic/lymphatic: negative for lymphadenopathy Musculoskeletal: negative for myalgias and arthralgias All other systems reviewed and are negative    Constitutional: in no apparent distress There were no vitals filed for this visit. EYES: anicteric ENMT: no thrush Cardiovascular: Cor RRR Respiratory: clear Musculoskeletal: no pedal edema noted Skin: negatives: no rash Neuro: non-focal  Labs: Lab Results  Component Value Date   WBC 3.4 (L) 10/16/2018   HGB 14.1 10/16/2018   HCT 42.6 10/16/2018   MCV 91.2 10/16/2018   PLT 246 10/16/2018    Lab Results  Component Value Date   CREATININE 1.05 10/16/2018   BUN 23 10/16/2018   NA 137 10/16/2018   K 4.1 10/16/2018   CL 104 10/16/2018   CO2 24 10/16/2018    Lab Results  Component Value Date   ALT 30 10/16/2018   AST 31 10/16/2018   ALKPHOS 72 10/16/2018   BILITOT 0.7 10/16/2018   INR 1.0 05/08/2013     Assessment: lung mass, possible fungal ball.  He has a potential mixed picture with some fungus and some adenocarcinoma mixed in though after his recent chemotherapy may just be residual necrosis and fungal mass.  The only finding of fungi unfortunately though is the path with branching hyphae. Other cultures and markers negative.    I do think with the findings on the recent PET scan now post treatment for adenocarcinoma, the pathology findings, it may  be at least partially a fungal ball.  I think it will be difficult to treat medically but antibiotic treatment may help some.  Therefore I will start him on coverage with voriconazole which covers multiple options including Aspergillus and he can be followed radiographically.  Ultimately though, I suspect he will need surgical resection.    Plan: 1) start voriconazole 2) follow up radiographically per Dr. Servando Snare 3) await discussion with tumor board 4) follow up in 2 weeks to check side effects, check CMP

## 2018-12-11 ENCOUNTER — Other Ambulatory Visit
Admission: RE | Admit: 2018-12-11 | Discharge: 2018-12-11 | Disposition: A | Discharge status: Home / Self Care | Payer: 59 | Source: Ambulatory Visit | Attending: Cardiothoracic Surgery | Admitting: Cardiothoracic Surgery

## 2018-12-11 DIAGNOSIS — Z01812 Encounter for preprocedural laboratory examination: Secondary | ICD-10-CM | POA: Insufficient documentation

## 2018-12-11 DIAGNOSIS — Z20828 Contact with and (suspected) exposure to other viral communicable diseases: Secondary | ICD-10-CM | POA: Diagnosis not present

## 2018-12-11 LAB — SARS CORONAVIRUS 2 (TAT 6-24 HRS): SARS Coronavirus 2: NEGATIVE

## 2018-12-12 ENCOUNTER — Other Ambulatory Visit: Payer: Self-pay

## 2018-12-12 ENCOUNTER — Ambulatory Visit: Payer: 59 | Attending: Cardiothoracic Surgery

## 2018-12-12 DIAGNOSIS — R918 Other nonspecific abnormal finding of lung field: Secondary | ICD-10-CM | POA: Insufficient documentation

## 2018-12-12 MED ORDER — ALBUTEROL SULFATE (2.5 MG/3ML) 0.083% IN NEBU
2.5000 mg | INHALATION_SOLUTION | Freq: Once | RESPIRATORY_TRACT | Status: AC
  Administered 2018-12-12: 18:00:00 2.5 mg via RESPIRATORY_TRACT
  Filled 2018-12-12: qty 3

## 2018-12-13 LAB — CULTURE, FUNGUS WITHOUT SMEAR

## 2018-12-19 LAB — FUNGAL ORGANISM REFLEX

## 2018-12-19 LAB — FUNGUS CULTURE WITH STAIN

## 2018-12-19 LAB — FUNGUS CULTURE RESULT

## 2018-12-23 ENCOUNTER — Other Ambulatory Visit: Payer: Self-pay | Admitting: Urology

## 2018-12-23 DIAGNOSIS — E291 Testicular hypofunction: Secondary | ICD-10-CM

## 2018-12-23 MED FILL — TIMOLOL 0.5% EYE DROPS: 0.5 | 90 days supply | Qty: 5 | Fill #2

## 2018-12-24 ENCOUNTER — Telehealth: Payer: Self-pay

## 2018-12-24 NOTE — Telephone Encounter (Signed)
COVID-19 Pre-Screening Questions:12/24/18   Do you currently have a fever (>100 F), chills or unexplained body aches?NO  Are you currently experiencing new cough, shortness of breath, sore throat, runny nose? NO  .  Have you recently travelled outside the state of [place] in the last 14 days? NO .  Have you been in contact with someone that is currently pending confirmation of Covid19 testing or has been confirmed to have the [place] virus?  NO  **If the patient answers NO to ALL questions -  advise the patient to please call the clinic before coming to the office should any symptoms develop.

## 2018-12-25 ENCOUNTER — Other Ambulatory Visit: Payer: Self-pay | Admitting: Internal Medicine

## 2018-12-25 ENCOUNTER — Other Ambulatory Visit: Payer: Self-pay

## 2018-12-25 ENCOUNTER — Encounter: Payer: Self-pay | Admitting: Internal Medicine

## 2018-12-25 ENCOUNTER — Ambulatory Visit (INDEPENDENT_AMBULATORY_CARE_PROVIDER_SITE_OTHER): Payer: 59 | Admitting: Internal Medicine

## 2018-12-25 VITALS — BP 161/99 | HR 92 | Temp 97.5°F | Ht 75.0 in | Wt 263.0 lb

## 2018-12-25 DIAGNOSIS — B49 Unspecified mycosis: Secondary | ICD-10-CM | POA: Diagnosis not present

## 2018-12-25 DIAGNOSIS — Z5181 Encounter for therapeutic drug level monitoring: Secondary | ICD-10-CM | POA: Insufficient documentation

## 2018-12-25 NOTE — Progress Notes (Signed)
[address]       [place] [address]             [phone]                    [name] [name] [name] [name] Date of Birth: Sep 15, 1955  Referring: Nestor Lewandowsky, MD Primary Care: Hubbard Hartshorn, FNP Primary Cardiologist: No primary care provider on file.  Chief Complaint:    Chief Complaint  Patient presents with  . Lung Cancer    review PFT's 12/12/18    History of Present Illness:    [name] [name] 63 y.o. male is seen in the office  today for evaluation of left hilar mass.  The patient presented in April 2015 with a rectal carcinoma, PET scan at that time suggested a large mass at the anal verge extending into the perineal fat with nodal involvement of what right inguinal lymph node and mesorectal lymph nodes.  PET scan in 2015 showed no abnormalities in the chest per report.  He was treated with preoperative radiation and chemotherapy, underwent robot-assisted abdominal perineal resection.  Final pathology showed 1.5 cm tumor, 3 lymph examined all negative, pathologic staging  pT2pN0 . Patient CEA has gone from 3.0 in October 2018 to 5.9 in September 2020. Because of complaint of cough in March 2020 and rising CEA, CT of the chest abdomen and pelvis was performed and subsequent PET scan June 2020.  CT and PET scan showed a large left hilar mass.  Bronchoscopy with biopsy had positive cytology consistent with adenocarcinoma of the colon.  Pathologic biopsy was negative for tumor, but did show:"SEPTATE AND BRANCHING FUNGAL HYPHAE, CONSISTENT WITH FUNGUS BALL".  Subsequent KOH preps and cultures have been negative for fungus.  the patient subsequently had a Port-A-Cath placed and underwent chemotherapy treatment for stage IV colon cancer.   Follow-up PET scan shows persistent left hilar mass with little change in size.  7.6 x 6.6 cm in size with attenuated metabolic activity along the margins SUV 3.1 previously 5.0.   Patient is referred  to thoracic surgery for consideration of pneumonectomy.    Patient is a lifelong cigarette non-smoker, he notes occasional cigar and chews tobacco.  He denies any known cardiac history.  He does note some exertional shortness of breath but is able to carry on usual daily activities without symptoms.   Since last seen the patient was seen by infectious disease and started on treatment with antifungal agent.   I had the cytology from [place] reviewed-originally reported as positive cytology consistent with adenocarcinoma the colon, subsequent review by pathologist agreed with a malignant pathology but suggested it may be lung primary not colon.   Pulmonary function studies were done last week FEV1 2.9 predicted 4.17 69% DLCO 21.57 predicted 31.4  68% predicted   Cancer Staging Rectal cancer [facility]) Staging form: Colon and Rectum, AJCC 7th Edition - Clinical stage from 08/05/2014: Stage IVA (T2, N0, M1a) - Signed by Lloyd Huger, MD on 07/06/2018  Current Activity/ Functional Status:  Patient is independent with mobility/ambulation, transfers, ADL's, IADL's.   Zubrod Score: At the time of surgery this patient's most appropriate activity status/level should be described as: _0     0    Normal activity, no symptoms _1     1    Restricted in physical strenuous activity but ambulatory, able to do out light work _2     2  Ambulatory and capable of self care, unable to do work activities, up and about               >50 % of waking hours                              _0     3    Only limited self care, in bed greater than 50% of waking hours _1     4    Completely disabled, no self care, confined to bed or chair _2     5    Moribund   Past Medical History:  Diagnosis Date  . Anemia   . Cancer [facility])    rectal 2015, followed by Oncology, permanent colostomy  . Colostomy in place [facility])   . Dyspnea    on exertion  . History of kidney stones   . Pneumonia   . Seasonal allergies      Past Surgical History:  Procedure Laterality Date  . BACK SURGERY    . CHOLECYSTECTOMY    . COLON SURGERY    . ENDOBRONCHIAL ULTRASOUND N/A 07/01/2018   Procedure: ENDOBRONCHIAL ULTRASOUND;  Surgeon: Tyler Pita, MD;  Location: [facility] ORS;  Service: Cardiopulmonary;  Laterality: N/A;  . FLEXIBLE BRONCHOSCOPY Left 11/20/2018   Procedure: FLEXIBLE BRONCHOSCOPY;  Surgeon: Tyler Pita, MD;  Location: [facility] ORS;  Service: Cardiopulmonary;  Laterality: Left;  . KNEE SURGERY Bilateral   . PORT A CATH REVISION Left 08/05/2018   Procedure: PORT A CATH REVISION, REPOSITION LEFT;  Surgeon: Robert Bellow, MD;  Location: [facility] ORS;  Service: General;  Laterality: Left;  . PORT-A-CATH REMOVAL Right 12/02/2014   Procedure: REMOVAL PORT-A-CATH;  Surgeon: Marlyce Huge, MD;  Location: [facility] ORS;  Service: General;  Laterality: Right;  . PORTACATH PLACEMENT    . PORTACATH PLACEMENT Left 07/29/2018   Procedure: INSERTION PORT-A-CATH LEFT;  Surgeon: Robert Bellow, MD;  Location: [facility] ORS;  Service: General;  Laterality: Left;    Family History  Problem Relation Age of Onset  . Cancer Mother   . Hypertension Mother   . COPD Mother   . COPD Father   . Cancer Father        Prostate CA, on Lupron  . Stroke Maternal Grandmother   . Cancer Paternal Grandmother   . Heart attack Paternal Grandfather   . Cancer Paternal Grandfather      Social History   Tobacco Use  Smoking Status Never Smoker  Smokeless Tobacco Current User  . Types: Chew  Tobacco Comment   might smoke a cigar once every 3 months    Social History   Substance and Sexual Activity  Alcohol Use Yes  . Alcohol/week: 0.0 standard drinks   Comment: RARE     Allergies  Allergen Reactions  . Sulfa Antibiotics Hives    Current Outpatient Medications  Medication Sig Dispense Refill  . albuterol (VENTOLIN HFA) 108 (90 Base) MCG/ACT inhaler Inhale 2 puffs into the lungs every 6 (six) hours as needed for  wheezing or shortness of breath. 18 g 1  . cetirizine (ZYRTEC) 10 MG tablet Take 10 mg by mouth at bedtime.     . chlorpheniramine-HYDROcodone (TUSSIONEX PENNKINETIC ER) 10-8 MG/5ML SUER Take 5 mLs by mouth every 8 (eight) hours as needed for cough. 115 mL 0  . Dextromethorphan-guaiFENesin (MUCINEX DM MAXIMUM STRENGTH) 60-1200 MG TB12 Take 1 tablet by mouth at bedtime as needed (congestion/drainage.).    [name]  loperamide (IMODIUM A-D) 2 MG capsule Take 2 mg by mouth as needed for diarrhea or loose stools.     . Multiple Vitamin (MULTIVITAMIN WITH MINERALS) TABS tablet Take 1 tablet by mouth at bedtime.    [name] NEEDLE, DISP, 18 G 18G X 1" MISC Use as directed 50 each 0  . ondansetron (ZOFRAN) 8 MG tablet Take 1 tablet (8 mg total) by mouth 2 (two) times daily as needed for refractory nausea / vomiting. 30 tablet 2  . Oxymetazoline HCl (AFRIN EXTRA MOISTURIZING NA) Place 1 spray into the nose 2 (two) times daily as needed (congestion.).    [name] prochlorperazine (COMPAZINE) 10 MG tablet Take 1 tablet (10 mg total) by mouth every 6 (six) hours as needed (Nausea or vomiting). 60 tablet 2  . SYRINGE-NEEDLE, DISP, 3 ML (LUER LOCK SAFETY SYRINGES) 21G X 1-1/2" 3 ML MISC Use as directed for testosterone administration 50 each 0  . tadalafil (ADCIRCA/CIALIS) 20 MG tablet Take 1 tablet (20 mg total) by mouth daily as needed for erectile dysfunction. 6 tablet 10  . timolol (BETIMOL) 0.5 % ophthalmic solution Place 1 drop into the right eye at bedtime.     . voriconazole (VFEND) 200 MG tablet Take 1 tablet (200 mg total) by mouth 2 (two) times daily. 60 tablet 1   No current facility-administered medications for this visit.     Pertinent items are noted in HPI.   Review of Systems:     Cardiac Review of Systems: [Y] = yes  or   [ N ] = no   Chest Pain [n   ]  Resting SOB [ n ] Exertional SOB  [ y ]  Vertell Limber Florencio.Farrier ]   Pedal Edema [ n ]    Palpitations [n  ] Syncope  Florencio.Farrier ]   Presyncope [ n ]   General Review of  Systems: [Y] = yes [  ]=no Constitional: recent weight change [  ];  Wt loss over the last 3 months [   ] anorexia [  ]; fatigue [  ]; nausea [  ]; night sweats [  ]; fever [  ]; or chills [  ];           Eye : blurred vision [  ]; diplopia [   ]; vision changes [  ];  Amaurosis fugax[  ]; Resp: cough [  ];  wheezing[  ];  hemoptysis[  ]; shortness of breath[  ]; paroxysmal nocturnal dyspnea[  ]; dyspnea on exertion[  ]; or orthopnea[  ];  GI:  gallstones[  ], vomiting[  ];  dysphagia[  ]; melena[  ];  hematochezia [  ]; heartburn[  ];   Hx of  Colonoscopy[  ]; GU: kidney stones [  ]; hematuria[  ];   dysuria [  ];  nocturia[  ];  history of     obstruction [  ]; urinary frequency [  ]             Skin: rash, swelling[  ];, hair loss[  ];  peripheral edema[  ];  or itching[  ]; Musculosketetal: myalgias[  ];  joint swelling[  ];  joint erythema[  ];  joint pain[  ];  back pain[  ];  Heme/Lymph: bruising[  ];  bleeding[  ];  anemia[  ];  Neuro: TIA[  ];  headaches[  ];  stroke[  ];  vertigo[  ];  seizures[  ];   paresthesias[  ];  difficulty walking[  ];  Psych:depression[  ]; anxiety[  ];  Endocrine: diabetes[  ];  thyroid dysfunction[  ];  Immunizations: Flu up to date [  ]; Pneumococcal up to date [  ];  Other:     PHYSICAL EXAMINATION: BP (!) 160/100   Pulse 88   Temp 97.8 F (36.6 C) (Skin)   Resp 20   Ht _0  (1.905 m)   Wt 119.3 kg   SpO2 95% Comment: RA  BMI 32.87 kg/m  General appearance: alert, cooperative and no distress Head: Normocephalic, without obvious abnormality, atraumatic Neck: no adenopathy, no carotid bruit, no JVD, supple, symmetrical, trachea midline and thyroid not enlarged, symmetric, no tenderness/mass/nodules Lymph nodes: Cervical, supraclavicular, and axillary nodes normal. Resp: clear to auscultation bilaterally Back: symmetric, no curvature. ROM normal. No CVA tenderness. Cardio: regular rate and rhythm, S1, S2 normal, no murmur, click, rub or gallop  GI: soft, non-tender; bowel sounds normal; no masses,  no organomegaly Extremities: extremities normal, atraumatic, no cyanosis or edema and Homans sign is negative, no sign of DVT Neurologic: Grossly normal   Diagnostic Studies & Laboratory data:     Recent Radiology Findings:   Nm Pet Image Restag (ps) Skull Base To Thigh  Result Date: 11/04/2018 CLINICAL DATA:  Subsequent treatment strategy for rectal cancer. Previous hypermetabolic left upper lobe mass was biopsied with pathology revealing fungus ball. EXAM: NUCLEAR MEDICINE PET SKULL BASE TO THIGH TECHNIQUE: 13.3 mCi F-18 FDG was injected intravenously. Full-ring PET imaging was performed from the skull base to thigh after the radiotracer. CT data was obtained and used for attenuation correction and anatomic localization. Fasting blood glucose: 111 mg/dl COMPARISON:  06/24/2018 FINDINGS: Mediastinal blood pool activity: SUV max 2.7 Liver activity: SUV max NA NECK: No significant abnormal hypermetabolic activity in this region. Incidental CT findings: none CHEST: 7.6 by 6.6 cm left upper lobe lesion has faintly accentuated metabolic activity along its margins, maximum SUV 3.1, previously 5.0. The amount of marginal metabolic activity is significantly reduced. Upon biopsy there is no evidence of malignancy but there was fungal material. Incidental CT findings: Left Port-A-Cath tip: SVC. Mild atherosclerotic calcification of the aortic arch, left anterior descending, right, and circumflex coronary arteries. ABDOMEN/PELVIS: Accentuated activity in the ascending colon without definite CT correlate, maximum SUV 8.5. This is most likely to be physiologic activity. Small amount of activity along the expected location of the penile urethra, likely excreted urinary FDG. Incidental CT findings: Cholecystectomy. Descending and sigmoid colon diverticulosis. Sigmoid colostomy. Stable postoperative/post therapy presacral findings. Aortoiliac atherosclerotic  vascular disease. Small nonobstructive bilateral renal calculi are suspected. SKELETON: No significant abnormal hypermetabolic activity in this region. Incidental CT findings: none IMPRESSION: 1. No findings of active or recurrent malignancy. 2. 7.6 by 6.6 cm left upper lobe lesion with reduced metabolic activity along its margins compared to prior, compatible with known fungus ball. 3. Postoperative/post therapy related presacral findings without compelling evidence of recurrence. 4. Mildly accentuated activity in the ascending colon is without CT correlate and most likely physiologic. 5. Other imaging findings of potential clinical significance: Aortic Atherosclerosis (ICD10-I70.0). Coronary atherosclerosis. Sigmoid colostomy. Descending and sigmoid colon diverticulosis. Bilateral tiny nonobstructive renal calculi. Electronically Signed   By: Van Clines M.D.   On: 11/04/2018 12:58    Dg Chest Port 1 View  Result Date: 11/20/2018 CLINICAL DATA:  Post left-sided flexible bronchoscopy. EXAM: PORTABLE CHEST 1 VIEW COMPARISON:  Chest x-ray 07/29/2018 and PET-CT 11/04/2018 FINDINGS: Left subclavian Port-A-Cath with tip demonstrating interval advancement as  tip is now just below the cavoatrial junction. Lungs are somewhat hypoinflated demonstrate no change in known biopsy-proven 9.7 cm fungus ball over the lingula. No evidence of effusion. Subtle prominence of the infrahilar vessels likely mild vascular congestion. Cardiomediastinal silhouette and remainder of the exam is unchanged. IMPRESSION: Suggestion mild vascular congestion. Stable known biopsy-proven 9.7 cm fungus ball over the lingula. Electronically Signed   By: Marin Olp M.D.   On: 11/20/2018 15:43   Dg C-arm 1-60 Min-no Report  Result Date: 11/20/2018 Fluoroscopy was utilized by the requesting physician.  No radiographic interpretation.     I have independently reviewed the above radiology studies  and reviewed the findings with the  patient.    PATH: Cytology - Non PAP CASE: [id number] PATIENT: Deryk Kornegay Non-Gyn Cytology Report  SPECIMEN SUBMITTED: A. Lung, left upper lobe; brushing  CLINICAL HISTORY: 63 year old male with rectal adenocarcinoma treated with chemoradiation and surgery in 2015 who presents with rising CEA and new LUL mass  PRE-OPERATIVE DIAGNOSIS: Metastatic colorectal carcinoma  POST-OPERATIVE DIAGNOSIS: Same as above   DIAGNOSIS: A.  LUNG, LEFT UPPER LOBE; BRONCHOSCOPY WITH BRUSHING: - POSITIVE FOR MALIGNANCY. - ADENOCARCINOMA CONSISTENT WITH METASTATIC COLORECTAL CARCINOMA IS PRESENT.  See concurrent cases [id number], [id number], and [id number].  There is insufficient material for ancillary molecular testing.  Comment: Sections demonstrate malignant epithelial groups floating within pools of mucin.  Limited panel of immunohistochemical stains was performed with the following pattern of results: Cytokeratin 7: Positive Cytokeratin 20: Positive TTF-1: Negative CDX 2: Posit ive This pattern of immunoreactivity supports the above diagnosis. These findings were communicated to Drs. Odetta Pink on 07/04/2018 via [facility] secure text.  IHC slides were prepared by [facility], [place], [place]. All controls stained appropriately.  This test was developed and its performance characteristics determined by [facility]. It has not been cleared or approved by the [place] Food and Drug Administration. The FDA does not require this test to go through premarket FDA review. This test is used for clinical purposes. It should not be regarded as investigational or for research. This laboratory is certified under the Clinical Laboratory Improvement Amendments (CLIA) as qualified to perform high complexity clinical laboratory testing.    GROSS DESCRIPTION: A. Site: Lung, left upper lobe Procedure: Bronchoscopy Cytotechnologist: Ashlee Howze-Soremekun and Marybeth  Anderson Specimen(s) collected: 4 Diff Quik stained slides Spec imen labeled: Left upper lobe, brush                      Volume: 15 mL                      Description: Clear CytoLyt solution admixed with wispy tissue fragments and 1 collection brush                      Submitted for: ThinPrep and cell block   Final Diagnosis performed by Quay Burow, MD.   Electronically signed 07/04/2018 2:34:08PM The electronic signature indicates that the named Attending Pathologist has evaluated the specimen  Technical component performed at [facility], [address], [place], [place] [address] Lab: [phone] Dir: Rush Farmer, MD, MMM  Professional component performed at [facility], [facility], [place], [place], [place] [address] Lab: [phone] Dir: Dellia Nims. Reuel Derby, MD    Surgical Pathology  CASE: [id number]  PATIENT: ZESPQ Kelson  Surgical Pathology Report    SPECIMEN SUBMITTED:  A. Lung, left upper lobe  CLINICAL HISTORY:  63 year old male with rectal adenocarcinoma treated with chemoradiation  and surgery in 2015 who presents with rising CEA and new LUL mass   PRE-OPERATIVE DIAGNOSIS:  Metastatic colorectal carcinoma   POST-OPERATIVE DIAGNOSIS:  Same as above    DIAGNOSIS:  A. LUNG, LEFT UPPER LOBE; BRONCHOSCOPY WITH BIOPSY:  - SEPTATE AND BRANCHING FUNGAL HYPHAE, CONSISTENT WITH FUNGUS BALL.  - FRAGMENTS OF BENIGN RESPIRATORY MUCOSA.  - NEGATIVE FOR MALIGNANCY.   See concurrent cases [id number], -[id number], and -281.   Comment:  Preliminary findings were communicated to Drs. Patsey Berthold and Grayland Ormond via  [facility] secure text on 07/02/2018 at 9:42 AM. GMS stain was performed and  fails to identify invasive disease. A CDX-2 stain was performed and is  negative.   GROSS DESCRIPTION:  A. Labeled: Left upper lobe, biopsy  Received: The specimen is retrieved via bronchoscopy and placed into  formalin.  Tissue fragment(s): Multiple  Size: Aggregate, 1.0  x 0.5 x 0.1 cm  Description: Received are irregular fragments of tan-pink soft tissue  admixed with minimal, hemorrhagic material. At the time of procedure, 2  Diff-Quik stained slides are performed. The remainder of the specimen  is filtered into a mesh bag and submitted for routine histology in  cassette 1.    Final Diagnosis performed by Quay Burow, MD.  Electronically signed  07/04/2018 12:34:35PM  The electronic signature indicates that the named Attending Pathologist  has evaluated the specimen  Technical component performed at [facility], [address], [place],  [place] [address] Lab: [phone] Dir: Rush Farmer, MD, MMM Professional  component performed at [facility], [facility], [place], [place], [place] [address] Lab: [phone] Dir: Dellia Nims.  Reuel Derby, MD    Recent Lab Findings: Lab Results  Component Value Date   WBC 3.4 (L) 10/16/2018   HGB 14.1 10/16/2018   HCT 42.6 10/16/2018   PLT 246 10/16/2018   GLUCOSE 110 (H) 12/25/2018   CHOL 147 03/22/2018   TRIG 126 03/22/2018   HDL 35 (L) 03/22/2018   LDLCALC 89 03/22/2018   ALT 23 12/25/2018   AST 21 12/25/2018   NA 139 12/25/2018   K 4.8 12/25/2018   CL 104 12/25/2018   CREATININE 0.91 12/25/2018   BUN 22 12/25/2018   CO2 27 12/25/2018   TSH 3.96 07/03/2016   INR 1.0 05/08/2013      Assessment / Plan:   I reviewed with the patient again the pathology results, cytology results, and PFTs.  Due to inconsistencies of the interpretations of his information specifically the pathology I recommended to him the we proceed with repeat bronchoscopy done by me and had a separate time than attempted surgical resection for adequate pathologic review.  He will continue on antifungal therapy for now.    Patient will have a Covid test tomorrow and we will plan on bronchoscopy possible EBUS on Monday.   Grace Isaac MD      [address] [place] [address] Office  [phone]     12/26/2018 2:55 PM

## 2018-12-25 NOTE — Progress Notes (Signed)
   Subjective:    Patient ID: [name] [name], male    DOB: 1955-07-16, 63 y.o.   MRN: [id number]  HPI Here for follow up after starting voriconazole. He has had rectal carcinoma in 2015 and this year diagnosed with adenocarcinoma and treated with chemotherapy.  He has had a persistent lung mass and only branching hyphae on any culture or path.  I started him on voriconazole with the intention of trying to reduce it some.   PFTs reportedly ok   Review of Systems  Constitutional: Negative for chills and fever.  Respiratory: Negative for cough, shortness of breath and wheezing.   Skin: Negative for rash.       Objective:   Physical Exam Constitutional:      Appearance: Normal appearance.  Eyes:     General: No scleral icterus. Cardiovascular:     Rate and Rhythm: Normal rate and regular rhythm.     Heart sounds: No murmur.  Pulmonary:     Effort: Pulmonary effort is normal. No respiratory distress.     Breath sounds: Normal breath sounds.  Skin:    Findings: No rash.  Neurological:     Mental Status: He is alert.  Psychiatric:        Mood and Affect: Mood normal.           Assessment & Plan:

## 2018-12-25 NOTE — Assessment & Plan Note (Signed)
Tolerating voriconazole and no issues.  I will plan to have him on it for 3 months if it improves.  I though don't anticipate complete resolution and not sure how much it will shrink.  Will need to recheck radiographically at some point.  He is seeing Dr Servando Snare tomorrow.

## 2018-12-25 NOTE — Assessment & Plan Note (Signed)
Will check cmp

## 2018-12-26 ENCOUNTER — Encounter: Payer: Self-pay | Admitting: Oncology

## 2018-12-26 ENCOUNTER — Other Ambulatory Visit: Payer: Self-pay | Admitting: *Deleted

## 2018-12-26 ENCOUNTER — Other Ambulatory Visit: Payer: Self-pay | Admitting: Emergency Medicine

## 2018-12-26 ENCOUNTER — Ambulatory Visit (INDEPENDENT_AMBULATORY_CARE_PROVIDER_SITE_OTHER): Payer: 59 | Admitting: Cardiothoracic Surgery

## 2018-12-26 VITALS — BP 160/100 | HR 88 | Temp 97.8°F | Resp 20 | Ht 75.0 in | Wt 263.0 lb

## 2018-12-26 DIAGNOSIS — C3432 Malignant neoplasm of lower lobe, left bronchus or lung: Secondary | ICD-10-CM

## 2018-12-26 DIAGNOSIS — C19 Malignant neoplasm of rectosigmoid junction: Secondary | ICD-10-CM | POA: Diagnosis not present

## 2018-12-26 DIAGNOSIS — R918 Other nonspecific abnormal finding of lung field: Secondary | ICD-10-CM

## 2018-12-26 DIAGNOSIS — R05 Cough: Secondary | ICD-10-CM

## 2018-12-26 DIAGNOSIS — R059 Cough, unspecified: Secondary | ICD-10-CM

## 2018-12-26 MED ORDER — HYDROCOD POLST-CPM POLST ER 10-8 MG/5ML PO SUER: 5.0000 mL | Freq: Three times a day (TID) | ORAL | 0 refills | Status: DC | PRN

## 2018-12-26 NOTE — Progress Notes (Signed)
The proposed treatment discussed in cancer conference 12/26/2018 is for discussion purpose only and is not a binding recommendation.  The patient was not physically examined nor present for their treatment options.  Therefore, final treatment plans cannot be decided.

## 2018-12-27 ENCOUNTER — Other Ambulatory Visit: Payer: Self-pay

## 2018-12-27 ENCOUNTER — Encounter: Payer: Self-pay | Admitting: Cardiothoracic Surgery

## 2018-12-27 ENCOUNTER — Other Ambulatory Visit
Admission: RE | Admit: 2018-12-27 | Discharge: 2018-12-27 | Disposition: A | Discharge status: Home / Self Care | Payer: 59 | Source: Ambulatory Visit | Attending: Cardiothoracic Surgery | Admitting: Cardiothoracic Surgery

## 2018-12-27 DIAGNOSIS — Z20828 Contact with and (suspected) exposure to other viral communicable diseases: Secondary | ICD-10-CM | POA: Diagnosis not present

## 2018-12-27 DIAGNOSIS — Z01812 Encounter for preprocedural laboratory examination: Secondary | ICD-10-CM | POA: Diagnosis not present

## 2018-12-27 LAB — SARS CORONAVIRUS 2 (TAT 6-24 HRS): SARS Coronavirus 2: NEGATIVE

## 2018-12-27 NOTE — Progress Notes (Signed)
Spoke with pt for pre-op call. Pt denies cardiac history, Diabetes or HTN.   Pt will get his Covid test done today. Pt given quarantine instructions and he voiced understanding.

## 2018-12-30 ENCOUNTER — Other Ambulatory Visit: Payer: Self-pay | Admitting: *Deleted

## 2018-12-30 ENCOUNTER — Encounter (HOSPITAL_COMMUNITY)
Admission: RE | Disposition: A | Discharge status: Home / Self Care | Payer: Self-pay | Source: Home / Self Care | Attending: Cardiothoracic Surgery

## 2018-12-30 ENCOUNTER — Encounter (HOSPITAL_COMMUNITY): Payer: Self-pay | Admitting: *Deleted

## 2018-12-30 ENCOUNTER — Ambulatory Visit (HOSPITAL_COMMUNITY): Payer: 59

## 2018-12-30 ENCOUNTER — Ambulatory Visit (HOSPITAL_COMMUNITY): Payer: 59 | Admitting: Certified Registered Nurse Anesthetist

## 2018-12-30 ENCOUNTER — Other Ambulatory Visit: Payer: Self-pay

## 2018-12-30 ENCOUNTER — Ambulatory Visit (HOSPITAL_COMMUNITY)
Admission: RE | Admit: 2018-12-30 | Discharge: 2018-12-30 | Disposition: A | Discharge status: Home / Self Care | Payer: 59 | Attending: Cardiothoracic Surgery | Admitting: Cardiothoracic Surgery

## 2018-12-30 DIAGNOSIS — R918 Other nonspecific abnormal finding of lung field: Secondary | ICD-10-CM

## 2018-12-30 DIAGNOSIS — C20 Malignant neoplasm of rectum: Secondary | ICD-10-CM | POA: Diagnosis not present

## 2018-12-30 DIAGNOSIS — F17220 Nicotine dependence, chewing tobacco, uncomplicated: Secondary | ICD-10-CM | POA: Diagnosis not present

## 2018-12-30 DIAGNOSIS — Z933 Colostomy status: Secondary | ICD-10-CM | POA: Insufficient documentation

## 2018-12-30 DIAGNOSIS — C7802 Secondary malignant neoplasm of left lung: Secondary | ICD-10-CM | POA: Insufficient documentation

## 2018-12-30 DIAGNOSIS — Z85048 Personal history of other malignant neoplasm of rectum, rectosigmoid junction, and anus: Secondary | ICD-10-CM | POA: Diagnosis not present

## 2018-12-30 DIAGNOSIS — D381 Neoplasm of uncertain behavior of trachea, bronchus and lung: Secondary | ICD-10-CM

## 2018-12-30 DIAGNOSIS — D649 Anemia, unspecified: Secondary | ICD-10-CM | POA: Diagnosis not present

## 2018-12-30 DIAGNOSIS — R911 Solitary pulmonary nodule: Secondary | ICD-10-CM

## 2018-12-30 DIAGNOSIS — C3492 Malignant neoplasm of unspecified part of left bronchus or lung: Secondary | ICD-10-CM | POA: Diagnosis not present

## 2018-12-30 DIAGNOSIS — Z01818 Encounter for other preprocedural examination: Secondary | ICD-10-CM | POA: Diagnosis not present

## 2018-12-30 DIAGNOSIS — Z9221 Personal history of antineoplastic chemotherapy: Secondary | ICD-10-CM | POA: Insufficient documentation

## 2018-12-30 DIAGNOSIS — F17290 Nicotine dependence, other tobacco product, uncomplicated: Secondary | ICD-10-CM | POA: Insufficient documentation

## 2018-12-30 DIAGNOSIS — E669 Obesity, unspecified: Secondary | ICD-10-CM | POA: Insufficient documentation

## 2018-12-30 DIAGNOSIS — Z6832 Body mass index (BMI) 32.0-32.9, adult: Secondary | ICD-10-CM | POA: Diagnosis not present

## 2018-12-30 DIAGNOSIS — Z923 Personal history of irradiation: Secondary | ICD-10-CM | POA: Insufficient documentation

## 2018-12-30 HISTORY — PX: VIDEO BRONCHOSCOPY WITH ENDOBRONCHIAL ULTRASOUND: SHX6177

## 2018-12-30 LAB — FUNGAL ANTIBODY PANEL 2,ID&CF
Aspergillus Flavus AB: NEGATIVE
Aspergillus Niger Antibodies: NEGATIVE
Aspergillus fumigatus: NEGATIVE
Blastomyces Abs, Qn, DID: NEGATIVE
Coccidioides Antibody ID: NEGATIVE
Mycelial Phase Antibody: <1:8 {titer}
Yeast Phase Antibody: <1:8 {titer}

## 2018-12-30 LAB — PROTIME-INR
INR: 1 (ref 0.8–1.2)
Prothrombin Time: 13.1 seconds (ref 11.4–15.2)

## 2018-12-30 LAB — COMPLETE METABOLIC PANEL WITH GFR
AG Ratio: 1.8 (calc) (ref 1.0–2.5)
ALT: 23 U/L (ref 9–46)
AST: 21 U/L (ref 10–35)
Albumin: 4.4 g/dL (ref 3.6–5.1)
Alkaline phosphatase (APISO): 66 U/L (ref 35–144)
BUN: 22 mg/dL (ref 7–25)
CO2: 27 mmol/L (ref 20–32)
Calcium: 9.8 mg/dL (ref 8.6–10.3)
Chloride: 104 mmol/L (ref 98–110)
Creat: 0.91 mg/dL (ref 0.70–1.25)
GFR, Est African American: 104 mL/min/{1.73_m2} (ref >=60)
GFR, Est Non African American: 89 mL/min/{1.73_m2} (ref >=60)
Globulin: 2.5 g/dL (calc) (ref 1.9–3.7)
Glucose, Bld: 110 mg/dL — ABNORMAL HIGH (ref 65–99)
Potassium: 4.8 mmol/L (ref 3.5–5.3)
Sodium: 139 mmol/L (ref 135–146)
Total Bilirubin: 0.4 mg/dL (ref 0.2–1.2)
Total Protein: 6.9 g/dL (ref 6.1–8.1)

## 2018-12-30 LAB — CBC
HCT: 43.6 % (ref 39.0–52.0)
Hemoglobin: 14.2 g/dL (ref 13.0–17.0)
MCH: 31.2 pg (ref 26.0–34.0)
MCHC: 32.6 g/dL (ref 30.0–36.0)
MCV: 95.8 fL (ref 80.0–100.0)
Platelets: 377 10*3/uL (ref 150–400)
RBC: 4.55 MIL/uL (ref 4.22–5.81)
RDW: 14.3 % (ref 11.5–15.5)
WBC: 4.9 10*3/uL (ref 4.0–10.5)
nRBC: 0 % (ref 0.0–0.2)

## 2018-12-30 LAB — APTT: aPTT: 30 seconds (ref 24–36)

## 2018-12-30 LAB — ASPERGILLUS ANTIGEN,SERUM
Aspergillus Ag, EIA: NOT DETECTED
Index Value: 0.06 (ref <0.50)

## 2018-12-30 SURGERY — BRONCHOSCOPY, WITH EBUS
Anesthesia: General

## 2018-12-30 MED ORDER — ONDANSETRON HCL 4 MG/2ML IJ SOLN
INTRAMUSCULAR | Status: AC
  Filled 2018-12-30: qty 2

## 2018-12-30 MED ORDER — LIDOCAINE 2% (20 MG/ML) 5 ML SYRINGE
INTRAMUSCULAR | Status: AC
  Filled 2018-12-30: qty 5

## 2018-12-30 MED ORDER — LACTATED RINGERS IV SOLN
INTRAVENOUS | Status: DC
  Administered 2018-12-30 (×2): via INTRAVENOUS

## 2018-12-30 MED ORDER — FENTANYL CITRATE (PF) 100 MCG/2ML IJ SOLN: 25.0000 ug | INTRAMUSCULAR | Status: DC | PRN

## 2018-12-30 MED ORDER — PHENYLEPHRINE 40 MCG/ML (10ML) SYRINGE FOR IV PUSH (FOR BLOOD PRESSURE SUPPORT)
PREFILLED_SYRINGE | INTRAVENOUS | Status: AC
  Filled 2018-12-30: qty 10

## 2018-12-30 MED ORDER — SUCCINYLCHOLINE CHLORIDE 200 MG/10ML IV SOSY
PREFILLED_SYRINGE | INTRAVENOUS | Status: DC | PRN
  Administered 2018-12-30: 120 mg via INTRAVENOUS

## 2018-12-30 MED ORDER — ROCURONIUM BROMIDE 10 MG/ML (PF) SYRINGE
PREFILLED_SYRINGE | INTRAVENOUS | Status: DC | PRN
  Administered 2018-12-30: 10 mg via INTRAVENOUS
  Administered 2018-12-30: 20 mg via INTRAVENOUS
  Administered 2018-12-30: 40 mg via INTRAVENOUS

## 2018-12-30 MED ORDER — EPINEPHRINE PF 1 MG/ML IJ SOLN
INTRAMUSCULAR | Status: DC | PRN
  Administered 2018-12-30: 1 mg

## 2018-12-30 MED ORDER — PROPOFOL 10 MG/ML IV BOLUS
INTRAVENOUS | Status: AC
  Filled 2018-12-30: qty 20

## 2018-12-30 MED ORDER — SUGAMMADEX SODIUM 200 MG/2ML IV SOLN
INTRAVENOUS | Status: DC | PRN
  Administered 2018-12-30: 200 mg via INTRAVENOUS

## 2018-12-30 MED ORDER — ONDANSETRON HCL 4 MG/2ML IJ SOLN
INTRAMUSCULAR | Status: DC | PRN
  Administered 2018-12-30: 4 mg via INTRAVENOUS

## 2018-12-30 MED ORDER — ONDANSETRON HCL 4 MG/2ML IJ SOLN: 4.0000 mg | Freq: Once | INTRAMUSCULAR | Status: DC | PRN

## 2018-12-30 MED ORDER — EPINEPHRINE PF 1 MG/ML IJ SOLN
INTRAMUSCULAR | Status: AC
  Filled 2018-12-30: qty 1

## 2018-12-30 MED ORDER — MIDAZOLAM HCL 2 MG/2ML IJ SOLN
INTRAMUSCULAR | Status: AC
  Filled 2018-12-30: qty 2

## 2018-12-30 MED ORDER — ACETAMINOPHEN 500 MG PO TABS
ORAL_TABLET | ORAL | Status: AC
  Filled 2018-12-30: qty 2

## 2018-12-30 MED ORDER — PROPOFOL 10 MG/ML IV BOLUS
INTRAVENOUS | Status: DC | PRN
  Administered 2018-12-30: 200 mg via INTRAVENOUS

## 2018-12-30 MED ORDER — DEXAMETHASONE SODIUM PHOSPHATE 10 MG/ML IJ SOLN
INTRAMUSCULAR | Status: AC
  Filled 2018-12-30: qty 1

## 2018-12-30 MED ORDER — LIDOCAINE 2% (20 MG/ML) 5 ML SYRINGE
INTRAMUSCULAR | Status: DC | PRN
  Administered 2018-12-30: 100 mg via INTRAVENOUS

## 2018-12-30 MED ORDER — ACETAMINOPHEN 500 MG PO TABS
1000.0000 mg | ORAL_TABLET | Freq: Once | ORAL | Status: AC
  Administered 2018-12-30: 1000 mg via ORAL

## 2018-12-30 MED ORDER — FENTANYL CITRATE (PF) 250 MCG/5ML IJ SOLN
INTRAMUSCULAR | Status: AC
  Filled 2018-12-30: qty 5

## 2018-12-30 MED ORDER — MIDAZOLAM HCL 5 MG/5ML IJ SOLN
INTRAMUSCULAR | Status: DC | PRN
  Administered 2018-12-30: 2 mg via INTRAVENOUS

## 2018-12-30 MED ORDER — FENTANYL CITRATE (PF) 250 MCG/5ML IJ SOLN
INTRAMUSCULAR | Status: DC | PRN
  Administered 2018-12-30: 100 ug via INTRAVENOUS

## 2018-12-30 MED ORDER — ROCURONIUM BROMIDE 10 MG/ML (PF) SYRINGE
PREFILLED_SYRINGE | INTRAVENOUS | Status: AC
  Filled 2018-12-30: qty 10

## 2018-12-30 MED ORDER — 0.9 % SODIUM CHLORIDE (POUR BTL) OPTIME
TOPICAL | Status: DC | PRN
  Administered 2018-12-30 (×2): 1000 mL

## 2018-12-30 MED ORDER — DEXAMETHASONE SODIUM PHOSPHATE 10 MG/ML IJ SOLN
INTRAMUSCULAR | Status: DC | PRN
  Administered 2018-12-30: 5 mg via INTRAVENOUS

## 2018-12-30 SURGICAL SUPPLY — 33 items
ADAPTER VALVE BIOPSY EBUS (MISCELLANEOUS) ×1 IMPLANT
ADPTR VALVE BIOPSY EBUS (MISCELLANEOUS) ×2
BLADE CLIPPER SURG (BLADE) ×3 IMPLANT
BRUSH CYTOL CELLEBRITY 1.5X140 (MISCELLANEOUS) ×6 IMPLANT
CANISTER SUCT 3000ML PPV (MISCELLANEOUS) ×3 IMPLANT
CONT SPEC 4OZ CLIKSEAL STRL BL (MISCELLANEOUS) ×12 IMPLANT
COVER BACK TABLE 60X90IN (DRAPES) ×3 IMPLANT
FORCEPS BIOP RJ4 1.8 (CUTTING FORCEPS) ×3 IMPLANT
FORCEPS BIOP SPYBITE 1.2X286 (FORCEP) IMPLANT
GAUZE SPONGE 4X4 12PLY STRL (GAUZE/BANDAGES/DRESSINGS) ×3 IMPLANT
GLOVE BIO SURGEON STRL SZ 6.5 (GLOVE) ×2 IMPLANT
GLOVE BIO SURGEONS STRL SZ 6.5 (GLOVE) ×1
KIT CLEAN ENDO COMPLIANCE (KITS) ×9 IMPLANT
KIT TURNOVER KIT B (KITS) ×3 IMPLANT
MARKER SKIN DUAL TIP RULER LAB (MISCELLANEOUS) ×3 IMPLANT
NEEDLE ASPIRATION VIZISHOT 19G (NEEDLE) IMPLANT
NEEDLE ASPIRATION VIZISHOT 21G (NEEDLE) ×3 IMPLANT
NEEDLE FILTER BLUNT 18X 1/2SAF (NEEDLE)
NEEDLE FILTER BLUNT 18X1 1/2 (NEEDLE) IMPLANT
NS IRRIG 1000ML POUR BTL (IV SOLUTION) ×3 IMPLANT
OIL SILICONE PENTAX (PARTS (SERVICE/REPAIRS)) ×3 IMPLANT
PAD ARMBOARD 7.5X6 YLW CONV (MISCELLANEOUS) ×6 IMPLANT
SYR 20ML ECCENTRIC (SYRINGE) ×3 IMPLANT
SYR 20ML LL LF (SYRINGE) ×3 IMPLANT
TOWEL GREEN STERILE (TOWEL DISPOSABLE) ×3 IMPLANT
TOWEL GREEN STERILE FF (TOWEL DISPOSABLE) ×3 IMPLANT
TRAP SPECIMEN MUCOUS 40CC (MISCELLANEOUS) ×3 IMPLANT
TUBE CONNECTING 20'X1/4 (TUBING) ×1
TUBE CONNECTING 20X1/4 (TUBING) ×2 IMPLANT
VALVE BIOPSY  SINGLE USE (MISCELLANEOUS) ×2
VALVE BIOPSY SINGLE USE (MISCELLANEOUS) ×1 IMPLANT
VALVE SUCTION BRONCHIO DISP (MISCELLANEOUS) ×3 IMPLANT
WATER STERILE IRR 1000ML POUR (IV SOLUTION) ×3 IMPLANT

## 2018-12-30 NOTE — Transfer of Care (Signed)
Immediate Anesthesia Transfer of Care Note  Patient: [name] [name]  Procedure(s) Performed: VIDEO BRONCHOSCOPY WITH ENDOBRONCHIAL ULTRASOUND (N/A )  Patient Location: PACU  Anesthesia Type:General  Level of Consciousness: awake, alert  and oriented  Airway & Oxygen Therapy: Patient Spontanous Breathing and Patient connected to nasal cannula oxygen  Post-op Assessment: Report given to RN and Post -op Vital signs reviewed and stable  Post vital signs: Reviewed and stable  Last Vitals:  Vitals Value Taken Time  BP 121/70 12/30/18 1337  Temp    Pulse 71 12/30/18 1339  Resp 23 12/30/18 1339  SpO2 100 % 12/30/18 1339  Vitals shown include unvalidated device data.  Last Pain:  Vitals:   12/30/18 1103  TempSrc: Oral  PainSc: 0-No pain      Patients Stated Pain Goal: 6 (15/61/53 7943)  Complications: No apparent anesthesia complications

## 2018-12-30 NOTE — Brief Op Note (Signed)
      [address]      [place] [address]             [phone]    12/30/2018  1:31 PM  PATIENT:  [name] [name]  63 y.o. male  PRE-OPERATIVE DIAGNOSIS:  LEFT HILAR MASS  POST-OPERATIVE DIAGNOSIS:  LEFT HILAR MASS  PROCEDURE:  Procedure(s): VIDEO BRONCHOSCOPY WITH ENDOBRONCHIAL ULTRASOUND (N/A) and biopsy   SURGEON:  Surgeon(s) and Role:    * Grace Isaac, MD - Primary    ANESTHESIA:   general  EBL:  None   BLOOD ADMINISTERED:none  DRAINS: none   LOCAL MEDICATIONS USED:  NONE  SPECIMEN:  Source of Specimen:  apical segement left upper lobe and lingular branch  DISPOSITION OF SPECIMEN:  and micro and cytology  COUNTS:  YES   DICTATION: .Dragon Dictation  PLAN OF CARE: Discharge to home after PACU  PATIENT DISPOSITION:  PACU - hemodynamically stable.   Delay start of Pharmacological VTE agent (>24hrs) due to surgical blood loss or risk of bleeding: yes

## 2018-12-30 NOTE — H&P (Signed)
[address]       [place] [address]             [phone]                    [name] [name] [place] Medical Record #[id number] Date of Birth: [birth year]  Referring: Nestor Lewandowsky, MD Primary Care: Hubbard Hartshorn, FNP Primary Cardiologist: No primary care provider on file.  Chief Complaint:    Chief Complaint  Patient presents with   Lung Cancer    review PFT's 12/12/18    History of Present Illness:    [name] [name] 63 y.o. male  Has  seen in the office for evaluation of left hilar mass.  The patient presented in April 2015 with a rectal carcinoma, PET scan at that time suggested a large mass at the anal verge extending into the perineal fat with nodal involvement of what right inguinal lymph node and mesorectal lymph nodes.  PET scan in 2015 showed no abnormalities in the chest per [name].  He was treated with preoperative radiation and chemotherapy, underwent robot-assisted abdominal perineal resection.  Final pathology showed 1.5 cm tumor, 3 lymph examined all negative, pathologic staging  pT2pN0 . Patient CEA has gone from 3.0 in October 2018 to 5.9 in September 2020. Because of complaint of cough in March 2020 and rising CEA, CT of the chest abdomen and pelvis was performed and subsequent PET scan June 2020.  CT and PET scan showed a large left hilar mass.  Bronchoscopy with biopsy had positive cytology consistent with adenocarcinoma of the colon.  Pathologic biopsy was negative for tumor, but did show:"SEPTATE AND BRANCHING FUNGAL HYPHAE, CONSISTENT WITH FUNGUS BALL".  Subsequent KOH preps and cultures have been negative for fungus.  the patient subsequently had a Port-A-Cath placed and underwent chemotherapy treatment for stage IV colon cancer.   Follow-up PET scan shows persistent left hilar mass with little change in size.  7.6 x 6.6 cm in size with attenuated metabolic activity along the margins SUV 3.1 previously 5.0.   Patient is referred to  thoracic surgery for consideration of pneumonectomy.    Patient is a lifelong cigarette non-smoker, he notes occasional cigar and chews tobacco.  He denies any known cardiac history.  He does note some exertional shortness of breath but is able to carry on usual daily activities without symptoms.   Since last seen the patient was seen by infectious disease and started on treatment with antifungal agent.   I had the cytology from [place] reviewed-originally reported as positive cytology consistent with adenocarcinoma the colon, subsequent review by pathologist agreed with a malignant pathology but suggested it may be lung primary not colon.   Pulmonary function studies were done last week FEV1 2.9 predicted 4.17 69% DLCO 21.57 predicted 31.4  68% predicted   Cancer Staging Rectal cancer [facility]) Staging form: Colon and Rectum, AJCC 7th Edition - Clinical stage from 08/05/2014: Stage IVA (T2, N0, M1a) - Signed by Lloyd Huger, MD on 07/06/2018  Current Activity/ Functional Status:  Patient is independent with mobility/ambulation, transfers, ADL's, IADL's.   Zubrod Score: At the time of surgery this patients most appropriate activity status/level should be described as: _0     0    Normal activity, no symptoms _1     1    Restricted in physical strenuous activity but ambulatory, able to do out light work _2     2  Ambulatory and capable of self care, unable to do work activities, up and about               >50 % of waking hours                              _0     3    Only limited self care, in bed greater than 50% of waking hours _1     4    Completely disabled, no self care, confined to bed or chair _2     5    Moribund   Past Medical History:  Diagnosis Date   Anemia    Cancer ([place])    rectal 2015, followed by Oncology, permanent colostomy   Colostomy in place [facility])    Dyspnea    on exertion   History of kidney stones    Pneumonia    Seasonal allergies     Past  Surgical History:  Procedure Laterality Date   BACK SURGERY     CHOLECYSTECTOMY     COLON SURGERY     ENDOBRONCHIAL ULTRASOUND N/A 07/01/2018   Procedure: ENDOBRONCHIAL ULTRASOUND;  Surgeon: Tyler Pita, MD;  Location: [facility] ORS;  Service: Cardiopulmonary;  Laterality: N/A;   FLEXIBLE BRONCHOSCOPY Left 11/20/2018   Procedure: FLEXIBLE BRONCHOSCOPY;  Surgeon: Tyler Pita, MD;  Location: [facility] ORS;  Service: Cardiopulmonary;  Laterality: Left;   KNEE SURGERY Bilateral    PORT A CATH REVISION Left 08/05/2018   Procedure: PORT A CATH REVISION, REPOSITION LEFT;  Surgeon: Robert Bellow, MD;  Location: [facility] ORS;  Service: General;  Laterality: Left;   PORT-A-CATH REMOVAL Right 12/02/2014   Procedure: REMOVAL PORT-A-CATH;  Surgeon: Marlyce Huge, MD;  Location: [facility] ORS;  Service: General;  Laterality: Right;   PORTACATH PLACEMENT     PORTACATH PLACEMENT Left 07/29/2018   Procedure: INSERTION PORT-A-CATH LEFT;  Surgeon: Robert Bellow, MD;  Location: [facility] ORS;  Service: General;  Laterality: Left;    Family History  Problem Relation Age of Onset   Cancer Mother    Hypertension Mother    COPD Mother    COPD Father    Cancer Father        Prostate CA, on Lupron   Stroke Maternal Grandmother    Cancer Paternal Grandmother    Heart attack Paternal Grandfather    Cancer Paternal Grandfather      Social History   Tobacco Use  Smoking Status Never Smoker  Smokeless Tobacco Current User   Types: Chew  Tobacco Comment   might smoke a cigar once every 3 months    Social History   Substance and Sexual Activity  Alcohol Use Yes   Alcohol/week: 0.0 standard drinks   Comment: RARE     Allergies  Allergen Reactions   Sulfa Antibiotics Hives    Current Facility-Administered Medications  Medication Dose Route Frequency Provider Last Rate Last Dose   acetaminophen (TYLENOL) 500 MG tablet            lactated ringers infusion    Intravenous Continuous Catalina Gravel, MD 10 mL/hr at 12/30/18 1129      Pertinent items are noted in HPI.   Review of Systems:     Cardiac Review of Systems: [Y] = yes  or   [ N ] = no   Chest Pain Florencio.Farrier   ]  Resting SOB [ n ] Exertional SOB  [ y ]  Cardell Peach ]   Pedal Edema [ n ]    Palpitations [n  ] Syncope  Florencio.Farrier ]   Presyncope [ n ]   General Review of Systems: [Y] = yes [  ]=no Constitional: recent weight change [  ];  Wt loss over the last 3 months [   ] anorexia [  ]; fatigue [  ]; nausea [  ]; night sweats [  ]; fever [  ]; or chills [  ];           Eye : blurred vision [  ]; diplopia [   ]; vision changes [  ];  Amaurosis fugax[  ]; Resp: cough [  ];  wheezing[  ];  hemoptysis[  ]; shortness of breath[  ]; paroxysmal nocturnal dyspnea[  ]; dyspnea on exertion[  ]; or orthopnea[  ];  GI:  gallstones[  ], vomiting[  ];  dysphagia[  ]; melena[  ];  hematochezia [  ]; heartburn[  ];   Hx of  Colonoscopy[  ]; GU: kidney stones [  ]; hematuria[  ];   dysuria [  ];  nocturia[  ];  history of     obstruction [  ]; urinary frequency [  ]             Skin: rash, swelling[  ];, hair loss[  ];  peripheral edema[  ];  or itching[  ]; Musculosketetal: myalgias[  ];  joint swelling[  ];  joint erythema[  ];  joint pain[  ];  back pain[  ];  Heme/Lymph: bruising[  ];  bleeding[  ];  anemia[  ];  Neuro: TIA[  ];  headaches[  ];  stroke[  ];  vertigo[  ];  seizures[  ];   paresthesias[  ];  difficulty walking[  ];  Psych:depression[  ]; anxiety[  ];  Endocrine: diabetes[  ];  thyroid dysfunction[  ];  Immunizations: Flu up to date [  ]; Pneumococcal up to date [  ];  Other:     PHYSICAL EXAMINATION: BP (!) 159/95    Pulse 87    Temp 98.8 F (37.1 C) (Oral)    Resp 20    Ht _0  (1.905 m)    Wt 118.8 kg    SpO2 96%    BMI 32.75 kg/m  General appearance: alert, cooperative and no distress Head: Normocephalic, without obvious abnormality, atraumatic Neck: no adenopathy, no carotid bruit,  no JVD, supple, symmetrical, trachea midline and thyroid not enlarged, symmetric, no tenderness/mass/nodules Lymph nodes: Cervical, supraclavicular, and axillary nodes normal. Resp: clear to auscultation bilaterally Back: symmetric, no curvature. ROM normal. No CVA tenderness. Cardio: regular rate and rhythm, S1, S2 normal, no murmur, click, rub or gallop GI: soft, non-tender; bowel sounds normal; no masses,  no organomegaly Extremities: extremities normal, atraumatic, no cyanosis or edema and Homans sign is negative, no sign of DVT Neurologic: Grossly normal   Diagnostic Studies & Laboratory data:     Recent Radiology Findings:  Dg Chest 2 View  Result Date: 12/30/2018 CLINICAL DATA:  Pre-op. Left hilar mass. EXAM: CHEST - 2 VIEW COMPARISON:  Radiographs 11/19/2016. PET-CT 11/04/2018. FINDINGS: Left subclavian Port-A-Cath tip extends to the superior cavoatrial junction. The heart size and mediastinal contours are stable. 10.5 cm left perihilar mass is unchanged. The lungs are otherwise clear. There is no significant pleural or pericardial effusion. IMPRESSION: 1. Stable 10.5 cm left perihilar mass. No acute findings. 2. The left subclavian Port-A-Cath appears  unchanged in position. Electronically Signed   By: Richardean Sale M.D.   On: 12/30/2018 10:57    Nm Pet Image Restag (ps) Skull Base To Thigh  Result Date: 11/04/2018 CLINICAL DATA:  Subsequent treatment strategy for rectal cancer. Previous hypermetabolic left upper lobe mass was biopsied with pathology revealing fungus ball. EXAM: NUCLEAR MEDICINE PET SKULL BASE TO THIGH TECHNIQUE: 13.3 mCi F-18 FDG was injected intravenously. Full-ring PET imaging was performed from the skull base to thigh after the radiotracer. CT data was obtained and used for attenuation correction and anatomic localization. Fasting blood glucose: 111 mg/dl COMPARISON:  06/24/2018 FINDINGS: Mediastinal blood pool activity: SUV max 2.7 Liver activity: SUV max NA NECK:  No significant abnormal hypermetabolic activity in this region. Incidental CT findings: none CHEST: 7.6 by 6.6 cm left upper lobe lesion has faintly accentuated metabolic activity along its margins, maximum SUV 3.1, previously 5.0. The amount of marginal metabolic activity is significantly reduced. Upon biopsy there is no evidence of malignancy but there was fungal material. Incidental CT findings: Left Port-A-Cath tip: SVC. Mild atherosclerotic calcification of the aortic arch, left anterior descending, right, and circumflex coronary arteries. ABDOMEN/PELVIS: Accentuated activity in the ascending colon without definite CT correlate, maximum SUV 8.5. This is most likely to be physiologic activity. Small amount of activity along the expected location of the penile urethra, likely excreted urinary FDG. Incidental CT findings: Cholecystectomy. Descending and sigmoid colon diverticulosis. Sigmoid colostomy. Stable postoperative/post therapy presacral findings. Aortoiliac atherosclerotic vascular disease. Small nonobstructive bilateral renal calculi are suspected. SKELETON: No significant abnormal hypermetabolic activity in this region. Incidental CT findings: none IMPRESSION: 1. No findings of active or recurrent malignancy. 2. 7.6 by 6.6 cm left upper lobe lesion with reduced metabolic activity along its margins compared to prior, compatible with known fungus ball. 3. Postoperative/post therapy related presacral findings without compelling evidence of recurrence. 4. Mildly accentuated activity in the ascending colon is without CT correlate and most likely physiologic. 5. Other imaging findings of potential clinical significance: Aortic Atherosclerosis (ICD10-I70.0). Coronary atherosclerosis. Sigmoid colostomy. Descending and sigmoid colon diverticulosis. Bilateral tiny nonobstructive renal calculi. Electronically Signed   By: Van Clines M.D.   On: 11/04/2018 12:58    Dg Chest Port 1 View  Result Date:  11/20/2018 CLINICAL DATA:  Post left-sided flexible bronchoscopy. EXAM: PORTABLE CHEST 1 VIEW COMPARISON:  Chest x-ray 07/29/2018 and PET-CT 11/04/2018 FINDINGS: Left subclavian Port-A-Cath with tip demonstrating interval advancement as tip is now just below the cavoatrial junction. Lungs are somewhat hypoinflated demonstrate no change in known biopsy-proven 9.7 cm fungus ball over the lingula. No evidence of effusion. Subtle prominence of the infrahilar vessels likely mild vascular congestion. Cardiomediastinal silhouette and remainder of the exam is unchanged. IMPRESSION: Suggestion mild vascular congestion. Stable known biopsy-proven 9.7 cm fungus ball over the lingula. Electronically Signed   By: Marin Olp M.D.   On: 11/20/2018 15:43   Dg C-arm 1-60 Min-no [name]  Result Date: 11/20/2018 Fluoroscopy was utilized by the requesting physician.  No radiographic interpretation.     I have independently reviewed the above radiology studies  and reviewed the findings with the patient.    PATH: Cytology - Non PAP CASE: [id number] PATIENT: [name] [name]  SPECIMEN SUBMITTED: A. Lung, left upper lobe; brushing  CLINICAL HISTORY: 63 year old male with rectal adenocarcinoma treated with chemoradiation and surgery in 2015 who presents with rising CEA and new LUL mass  PRE-OPERATIVE DIAGNOSIS: Metastatic colorectal carcinoma  POST-OPERATIVE DIAGNOSIS: Same as above  DIAGNOSIS: A.  LUNG, LEFT UPPER LOBE; BRONCHOSCOPY WITH BRUSHING: - POSITIVE FOR MALIGNANCY. - ADENOCARCINOMA CONSISTENT WITH METASTATIC COLORECTAL CARCINOMA IS PRESENT.  See concurrent cases [id number], [id number], and [id number].  There is insufficient material for ancillary molecular testing.  Comment: Sections demonstrate malignant epithelial groups floating within pools of mucin.  Limited panel of immunohistochemical stains was performed with the following pattern of results: Cytokeratin 7:  Positive Cytokeratin 20: Positive TTF-1: Negative CDX 2: Posit ive This pattern of immunoreactivity supports the above diagnosis. These findings were communicated to Drs. Odetta Pink on 07/04/2018 via [facility] secure text.  IHC slides were prepared by [facility] for Molecular Biology and Pathology, [place], [place]. All controls stained appropriately.  This test was developed and its performance characteristics determined by [facility]. It has not been cleared or approved by the [place] Food and Drug Administration. The FDA does not require this test to go through premarket FDA review. This test is used for clinical purposes. It should not be regarded as investigational or for research. This laboratory is certified under the Clinical Laboratory Improvement Amendments (CLIA) as qualified to perform high complexity clinical laboratory testing.    GROSS DESCRIPTION: A. Site: Lung, left upper lobe Procedure: Bronchoscopy Cytotechnologist: Ashlee Howze-Soremekun and Marybeth Anderson Specimen(s) collected: 4 Diff Quik stained slides Spec imen labeled: Left upper lobe, brush                      Volume: 15 mL                      Description: Clear CytoLyt solution admixed with wispy tissue fragments and 1 collection brush                      Submitted for: ThinPrep and cell block   Final Diagnosis performed by Quay Burow, MD.   Electronically signed 07/04/2018 2:34:08PM The electronic signature indicates that the named Attending Pathologist has evaluated the specimen  Technical component performed at [facility], [address], [place], [place] [address] Lab: [phone] Dir: Rush Farmer, MD, MMM  Professional component performed at [facility], [facility], [place], [place], [place] [address] Lab: [phone] Dir: Dellia Nims. Reuel Derby, MD    Surgical Pathology  CASE: [id number]  PATIENT: [name] [name]  Surgical Pathology [name]    SPECIMEN SUBMITTED:  A.  Lung, left upper lobe   CLINICAL HISTORY:  63 year old male with rectal adenocarcinoma treated with chemoradiation  and surgery in 2015 who presents with rising CEA and new LUL mass   PRE-OPERATIVE DIAGNOSIS:  Metastatic colorectal carcinoma   POST-OPERATIVE DIAGNOSIS:  Same as above    DIAGNOSIS:  A. LUNG, LEFT UPPER LOBE; BRONCHOSCOPY WITH BIOPSY:  - SEPTATE AND BRANCHING FUNGAL HYPHAE, CONSISTENT WITH FUNGUS BALL.  - FRAGMENTS OF BENIGN RESPIRATORY MUCOSA.  - NEGATIVE FOR MALIGNANCY.   See concurrent cases [id number], -[id number], and -281.   Comment:  Preliminary findings were communicated to Drs. Patsey Berthold and Grayland Ormond via  [facility] secure text on 07/02/2018 at 9:42 AM. GMS stain was performed and  fails to identify invasive disease. A CDX-2 stain was performed and is  negative.   GROSS DESCRIPTION:  A. Labeled: Left upper lobe, biopsy  Received: The specimen is retrieved via bronchoscopy and placed into  formalin.  Tissue fragment(s): Multiple  Size: Aggregate, 1.0 x 0.5 x 0.1 cm  Description: Received are irregular fragments of tan-pink soft tissue  admixed with minimal, hemorrhagic material.  At the time of procedure, 2  Diff-Quik stained slides are performed. The remainder of the specimen  is filtered into a mesh bag and submitted for routine histology in  cassette 1.    Final Diagnosis performed by Quay Burow, MD.  Electronically signed  07/04/2018 12:34:35PM  The electronic signature indicates that the named Attending Pathologist  has evaluated the specimen  Technical component performed at [facility], [address], [place],  [place] [address] Lab: [phone] Dir: Rush Farmer, MD, MMM Professional  component performed at [facility], [facility], [place], [place], [place] [address] Lab: [phone] Dir: Dellia Nims.  Reuel Derby, MD    Recent Lab Findings: Lab Results  Component Value Date   WBC 3.4 (L) 10/16/2018   HGB 14.1 10/16/2018   HCT  42.6 10/16/2018   PLT 246 10/16/2018   GLUCOSE 110 (H) 12/25/2018   CHOL 147 03/22/2018   TRIG 126 03/22/2018   HDL 35 (L) 03/22/2018   LDLCALC 89 03/22/2018   ALT 23 12/25/2018   AST 21 12/25/2018   NA 139 12/25/2018   K 4.8 12/25/2018   CL 104 12/25/2018   CREATININE 0.91 12/25/2018   BUN 22 12/25/2018   CO2 27 12/25/2018   TSH 3.96 07/03/2016   INR 1.0 05/08/2013      Assessment / Plan:   I reviewed with the patient again the pathology results, cytology results, and PFTs.  Due to inconsistencies of the interpretations of his information specifically the pathology I recommended to him the we proceed with repeat bronchoscopy done by me and had a separate time than attempted surgical resection for adequate pathologic review.  He will continue on antifungal therapy for now.    The goals risks and alternatives of the planned surgical procedure Procedure(s): VIDEO BRONCHOSCOPY WITH ENDOBRONCHIAL ULTRASOUND (N/A)  have been discussed with the patient in detail. The risks of the procedure including death, infection, stroke, myocardial infarction, bleeding, blood transfusion have all been discussed specifically.  I have quoted [employer] a 1 % of perioperative mortality and a complication rate as high as 20 %. The patient's questions have been answered.Trystin L Huckaba is willing  to proceed with the planned procedure.   Grace Isaac MD      [address] [place] [address] Office [phone]     12/30/2018 11:37 AM

## 2018-12-30 NOTE — Anesthesia Postprocedure Evaluation (Signed)
Anesthesia Post Note  Patient: Aijalon Kirtz Fess  Procedure(s) Performed: VIDEO BRONCHOSCOPY WITH ENDOBRONCHIAL ULTRASOUND (N/A )     Patient location during evaluation: PACU Anesthesia Type: General Level of consciousness: awake and alert Pain management: pain level controlled Vital Signs Assessment: post-procedure vital signs reviewed and stable Respiratory status: spontaneous breathing, nonlabored ventilation, respiratory function stable and patient connected to nasal cannula oxygen Cardiovascular status: blood pressure returned to baseline and stable Postop Assessment: no apparent nausea or vomiting Anesthetic complications: no    Last Vitals:  Vitals:   12/30/18 1452 12/30/18 1507  BP: 119/80 116/81  Pulse: 67 66  Resp: 12 12  Temp:  37 C  SpO2: 95% 98%    Last Pain:  Vitals:   12/30/18 1405  TempSrc:   PainSc: 0-No pain                 Catalina Gravel

## 2018-12-30 NOTE — Anesthesia Preprocedure Evaluation (Addendum)
Anesthesia Evaluation  Patient identified by MRN, date of birth, ID band Patient awake    Reviewed: Allergy & Precautions, NPO status , Patient's Chart, lab work & pertinent test results  Airway Mallampati: II  TM Distance: >3 FB Neck ROM: Full    Dental  (+) Teeth Intact, Dental Advisory Given   Pulmonary  LEFT HILAR MASS   breath sounds clear to auscultation       Cardiovascular negative cardio ROS   Rhythm:Regular Rate:Normal     Neuro/Psych negative neurological ROS  negative psych ROS   GI/Hepatic Neg liver ROS, Colorectal cancer s/p colostomy    Endo/Other  Obesity   Renal/GU negative Renal ROS     Musculoskeletal  (+) Arthritis ,   Abdominal   Peds  Hematology   Anesthesia Other Findings   Reproductive/Obstetrics                            Anesthesia Physical Anesthesia Plan  ASA: III  Anesthesia Plan: General   Post-op Pain Management:    Induction:   PONV Risk Score and Plan: 2 and Ondansetron and Dexamethasone  Airway Management Planned: Oral ETT  Additional Equipment:   Intra-op Plan:   Post-operative Plan: Extubation in OR  Informed Consent: I have reviewed the patients History and Physical, chart, labs and discussed the procedure including the risks, benefits and alternatives for the proposed anesthesia with the patient or authorized representative who has indicated his/her understanding and acceptance.     Dental advisory given  Plan Discussed with: CRNA and Anesthesiologist  Anesthesia Plan Comments:        Anesthesia Quick Evaluation

## 2018-12-30 NOTE — Discharge Instructions (Signed)
Flexible Bronchoscopy, Care After This sheet gives you information about how to care for yourself after your procedure. Your health care provider may also give you more specific instructions. If you have problems or questions, contact your health care provider. What can I expect after the procedure? After the procedure, it is common to have the following symptoms for 24-48 hours:  A cough that is worse than it was before the procedure.  A low-grade fever.  A sore throat or hoarse voice.  Small streaks of blood in the mucus from your lungs (sputum), if tissue samples were removed (biopsy). Follow these instructions at home: Eating and drinking  Do not eat or drink anything (including water) for 2 hours after your procedure, or until your numbing medicine (local anesthetic) has worn off. Having a numb throat increases your risk of burning yourself or choking.  After your numbness is gone and your cough and gag reflexes have returned, you may start eating only soft foods and slowly drinking liquids.  The day after the procedure, return to your normal diet. Driving  Do not drive for 24 hours if you were given a medicine to help you relax (sedative).  Do not drive or use heavy machinery while taking prescription pain medicine. General instructions   Take over-the-counter and prescription medicines only as told by your health care provider.  Return to your normal activities as told by your health care provider. Ask your health care provider what activities are safe for you.  Do not use any products that contain nicotine or tobacco, such as cigarettes and e-cigarettes. If you need help quitting, ask your health care provider.  Keep all follow-up visits as told by your health care provider. This is important, especially if you had a biopsy taken. Get help right away if:  You have shortness of breath that gets worse.  You become light-headed or feel like you might faint.  You have  chest pain.  You cough up more than a small amount of blood.  The amount of blood you cough up increases. Summary  Common symptoms in the 24-48 hours following a flexible bronchoscopy include cough, low-grade fever, sore throat or hoarse voice, and blood-streaked mucus from the lungs (if you had a biopsy).  Do not eat or drink anything (including water) for 2 hours after your procedure, or until your local anesthetic has worn off. You can return to your normal diet the day after the procedure.  Get help right away if you develop worsening shortness of breath, have chest pain, become light-headed, or cough up more than a small amount of blood. This information is not intended to replace advice given to you by your health care provider. Make sure you discuss any questions you have with your health care provider. Document Released: 07/29/2004 Document Revised: 12/22/2016 Document Reviewed: 01/28/2016 Elsevier Patient Education  2020 [employer]  

## 2018-12-30 NOTE — Anesthesia Procedure Notes (Addendum)
Procedure Name: Intubation Date/Time: 12/30/2018 12:11 PM Performed by: Trinna Post., CRNA Pre-anesthesia Checklist: Patient identified, Emergency Drugs available, Suction available, Patient being monitored and Timeout performed Patient Re-evaluated:Patient Re-evaluated prior to induction Oxygen Delivery Method: Circle system utilized Preoxygenation: Pre-oxygenation with 100% oxygen Induction Type: IV induction, Rapid sequence and Cricoid Pressure applied Laryngoscope Size: Glidescope and 4 Grade View: Grade I Tube type: Oral Tube size: 8.5 mm Number of attempts: 1 Airway Equipment and Method: Rigid stylet and Video-laryngoscopy Placement Confirmation: ETT inserted through vocal cords under direct vision,  positive ETCO2 and breath sounds checked- equal and bilateral Secured at: 23 cm Tube secured with: Tape Dental Injury: Teeth and Oropharynx as per pre-operative assessment  Difficulty Due To: Difficulty was anticipated Comments: Planned use of glidescope based off of prior anesthesia airway notes where McGraph was used

## 2018-12-31 ENCOUNTER — Encounter (HOSPITAL_COMMUNITY): Payer: Self-pay | Admitting: Cardiothoracic Surgery

## 2018-12-31 LAB — ACID FAST SMEAR (AFB, MYCOBACTERIA)
Acid Fast Smear: NEGATIVE
Acid Fast Smear: NEGATIVE

## 2019-01-01 NOTE — Op Note (Signed)
NAME: [name] [name], [name] [name]. MEDICAL RECORD ZE:[id number] ACCOUNT [id number] DATE OF BIRTH:1955/10/23 FACILITY: [facility] LOCATION: [facility] PHYSICIAN:Valton Schwartz BServando Snare, MD  OPERATIVE REPORT  DATE OF PROCEDURE:  12/30/2018  PREOPERATIVE DIAGNOSIS:  Left hilar mass metastatic on previous cytology.  POSTOPERATIVE DIAGNOSIS:  Left hilar mass metastatic on previous cytology.  SURGICAL PROCEDURE:  Bronchoscopy under general anesthesia with biopsy on endobronchial left upper lobe  and endobronchial ultrasound.  SURGEON:  Lanelle Bal, MD  BRIEF HISTORY:  The patient is a 63 year old patient who was diagnosed with previous rectal cancer in the spring of 2020.  He was noted to have increasing cough and rising CEA.  Chest x-ray, CT scan of the chest, PET scan showed an 8-10 cm left hilar  mass with hypermetabolic activity around the rim of the mass.  Bronchoscopy with biopsy and cytology was done at [name].  The pathologic diagnosis showed no malignancy; however, on cytologies, it was interpreted as metastatic rectal cancer.  He was  treated for stage IV, presumed metastasis to the lung with chemotherapy over the summer of 2020.  The previous bronchoscopies had revealed HIFU elements suggestive of Aspergillus, but the cultures never grew anything.  After repeat bronchoscopy and  really no change in the size of the mass with chemo, he was referred to [facility] for further evaluation and treatment.  Pulmonary function studies were performed with approximately 65% predicted, FEV1 and diffusion capacity.  Before undertaking a  surgical resection that potentially could require pneumonectomy especially for metastatic disease, we recommended to the patient that we proceed with bronchoscopy to fully evaluate the surgical options.  The patient agreed and signed informed consent.  DESCRIPTION OF PROCEDURE:  The patient underwent general endotracheal anesthesia without incident.  A single lumen endotracheal tube  was placed.  Appropriate timeout was performed.  We then proceeded with a 2.8 mm bronchoscope first in the right  tracheobronchial tree to the subsegmental level without evidence of endobronchial lesion.  We then passed the scope down the left main stem bronchus.  Left main stem bronchus was clear of any endobronchial lesion.  The upper lobe bronchus white somewhat  necrotic-appearing tissue appeared in orifice of the apical posterior bronchus and also in the superior segment of the lingular bronchus.  Both of the lesions were past the takeoff of the left lower lobe bronchus, which did not appear involved.   Initially, there was a significant amount of mucus around the lingular branch, but after suctioning and irrigation, we switched to a 2 mm scope and were easily able to pass into the inferior segment of the lingula.  We then turned our attention to  multiple brushings and biopsies of the lingular mass and the apical posterior endobronchial masses.  Multiple brushings and biopsies were obtained.  Initial smear of the bronchus did confirm malignant cells.  As we did biopsies a portion of the biopsies  were also submitted to microbiology for culture, fungal stains, and smears.  Topical cold saline with dilute epinephrine was used topically to decrease bleeding.  With sufficient amount of material for pathologic examination tracheobronchial tree was  suctioned clear.  The patient was then awakened and extubated in the operating room, transferred to the recovery room for postoperative care.  Photographs of the procedure were saved to the [facility] system and imported into the EMR.                      CN/NUANCE  D:12/31/2018 T:01/01/2019 JOB:[id number]

## 2019-01-02 LAB — CYTOLOGY - NON PAP

## 2019-01-03 LAB — SURGICAL PATHOLOGY

## 2019-01-04 LAB — AEROBIC/ANAEROBIC CULTURE W GRAM STAIN (SURGICAL/DEEP WOUND)
Culture: NO GROWTH
Culture: NO GROWTH
Gram Stain: NONE SEEN

## 2019-01-06 ENCOUNTER — Other Ambulatory Visit: Payer: Self-pay

## 2019-01-06 DIAGNOSIS — R918 Other nonspecific abnormal finding of lung field: Secondary | ICD-10-CM

## 2019-01-06 MED ORDER — ALPRAZOLAM 0.25 MG PO TABS: 0.2500 mg | ORAL_TABLET | Freq: Once | ORAL | 0 refills | Status: AC

## 2019-01-06 NOTE — Progress Notes (Signed)
Dr. Servando Snare stated it was okay for pt to take Xanax 0.25 mg 1 hour prior to his  Brain MRI on 01/18/19 d/t anxiety. Rx called in to pt's pharmacy for Xanax 0.25 mg po one hr prior to Brain MRI, quantity 1 w/ no refills. Pt notified and instructed not to drive after taking xanax.

## 2019-01-09 ENCOUNTER — Other Ambulatory Visit: Payer: Self-pay | Admitting: *Deleted

## 2019-01-09 NOTE — Progress Notes (Signed)
The proposed treatment discussed in cancer conference 01/09/19 is for discussion purpose only and is not a binding recommendation.  The patient was not physically examined nor present for their treatment options.  Therefore, final treatment plans cannot be decided.

## 2019-01-09 NOTE — Progress Notes (Signed)
[place]  [phone] [phone]  ID: [name] [name] OB: Jul 10, 1955  MR#: [id number]  PJA#:[id number]  Patient Care Team: Hubbard Hartshorn, FNP as PCP - General (Family Medicine) Marlyce Huge, MD as Attending Physician (Surgery) Lloyd Huger, MD as Consulting Physician (Oncology) Abbie Sons, MD (Urology) Eulogio Bear, MD as Consulting Physician (Ophthalmology) Flora Lipps, MD as Consulting Physician (Pulmonary Disease)  I connected with [name] [name] on 01/13/19 at  2:30 PM EST by video enabled telemedicine visit and verified that I am speaking with the correct person using two identifiers.   I discussed the limitations, risks, security and privacy concerns of performing an evaluation and management service by telemedicine and the availability of in-person appointments. I also discussed with the patient that there may be a patient responsible charge related to this service. The patient expressed understanding and agreed to proceed.   Other persons participating in the visit and their role in the encounter: Patient, MD.  Patient's location: Home. Provider's location: Clinic.  CHIEF COMPLAINT: Recurrent, stage IV rectal adenocarcinoma.  INTERVAL HISTORY: Patient agreed to video enabled telemedicine visit for further evaluation and discussion of his bronchoscopy results.  He continues to have insomnia and fatigue, but otherwise feels well.  He does not complain of peripheral neuropathy today.  He has no neurologic complaints.  He denies any recent fevers or illnesses.  He denies any chest pain, shortness of breath, or hemoptysis.  He denies nausea, vomiting, constipation, or diarrhea. He has noted no blood or melena in his colostomy bag.  He has no urinary complaints.  Patient offers no further specific complaints today.  REVIEW OF SYSTEMS:   Review of Systems  Constitutional: Positive for malaise/fatigue. Negative for  fever and weight loss.  Respiratory: Positive for cough. Negative for shortness of breath.   Cardiovascular: Negative.  Negative for chest pain and leg swelling.  Gastrointestinal: Negative.  Negative for abdominal pain, blood in stool, constipation, diarrhea and melena.  Genitourinary: Negative.  Negative for dysuria.  Musculoskeletal: Negative.  Negative for back pain.  Skin: Negative.  Negative for rash.  Neurological: Negative.  Negative for tingling, sensory change, focal weakness, weakness and headaches.  Psychiatric/Behavioral: The patient has insomnia. The patient is not nervous/anxious.     As per HPI. Otherwise, a complete review of systems is negative.  PAST MEDICAL HISTORY: Past Medical History:  Diagnosis Date  . Anemia   . Cancer [facility])    rectal 2015, followed by Oncology, permanent colostomy  . Colostomy in place [facility])   . Dyspnea    on exertion  . History of kidney stones   . Pneumonia   . Seasonal allergies     PAST SURGICAL HISTORY: Past Surgical History:  Procedure Laterality Date  . BACK SURGERY    . CHOLECYSTECTOMY    . COLON SURGERY    . ENDOBRONCHIAL ULTRASOUND N/A 07/01/2018   Procedure: ENDOBRONCHIAL ULTRASOUND;  Surgeon: Tyler Pita, MD;  Location: [facility] ORS;  Service: Cardiopulmonary;  Laterality: N/A;  . FLEXIBLE BRONCHOSCOPY Left 11/20/2018   Procedure: FLEXIBLE BRONCHOSCOPY;  Surgeon: Tyler Pita, MD;  Location: [facility] ORS;  Service: Cardiopulmonary;  Laterality: Left;  . KNEE SURGERY Bilateral   . PORT A CATH REVISION Left 08/05/2018   Procedure: PORT A CATH REVISION, REPOSITION LEFT;  Surgeon: Robert Bellow, MD;  Location: [facility] ORS;  Service: General;  Laterality: Left;  . PORT-A-CATH REMOVAL Right 12/02/2014   Procedure: REMOVAL PORT-A-CATH;  Surgeon: Harrell Gave  Lundquist, MD;  Location: [facility] ORS;  Service: General;  Laterality: Right;  . PORTACATH PLACEMENT    . PORTACATH PLACEMENT Left 07/29/2018   Procedure: INSERTION  PORT-A-CATH LEFT;  Surgeon: Robert Bellow, MD;  Location: [facility] ORS;  Service: General;  Laterality: Left;  [name] VIDEO BRONCHOSCOPY WITH ENDOBRONCHIAL ULTRASOUND N/A 12/30/2018   Procedure: VIDEO BRONCHOSCOPY WITH ENDOBRONCHIAL ULTRASOUND;  Surgeon: Grace Isaac, MD;  Location: [place];  Service: Thoracic;  Laterality: N/A;    FAMILY HISTORY: Father with prostate cancer.  COPD, CHF.     ADVANCED DIRECTIVES:    HEALTH MAINTENANCE: Social History   Tobacco Use  . Smoking status: Never Smoker  . Smokeless tobacco: Current User    Types: Chew  . Tobacco comment: might smoke a cigar once every 3 months  Substance Use Topics  . Alcohol use: Yes    Alcohol/week: 0.0 standard drinks    Comment: RARE  . Drug use: No     Colonoscopy:  PAP:  Bone density:  Lipid panel:  Allergies  Allergen Reactions  . Sulfa Antibiotics Hives    Current Outpatient Medications  Medication Sig Dispense Refill  . albuterol (VENTOLIN HFA) 108 (90 Base) MCG/ACT inhaler Inhale 2 puffs into the lungs every 6 (six) hours as needed for wheezing or shortness of breath. 18 g 1  . cetirizine (ZYRTEC) 10 MG tablet Take 10 mg by mouth at bedtime.     . chlorpheniramine-HYDROcodone (TUSSIONEX PENNKINETIC ER) 10-8 MG/5ML SUER Take 5 mLs by mouth every 8 (eight) hours as needed for cough. 115 mL 0  . Dextromethorphan-guaiFENesin (MUCINEX DM MAXIMUM STRENGTH) 60-1200 MG TB12 Take 1 tablet by mouth at bedtime as needed (congestion/drainage.).    [name] loperamide (IMODIUM A-D) 2 MG capsule Take 2 mg by mouth as needed for diarrhea or loose stools.     . Multiple Vitamin (MULTIVITAMIN WITH MINERALS) TABS tablet Take 1 tablet by mouth at bedtime.    . Oxymetazoline HCl (AFRIN EXTRA MOISTURIZING NA) Place 1 spray into the nose 2 (two) times daily as needed (congestion.).    [name] tadalafil (ADCIRCA/CIALIS) 20 MG tablet Take 1 tablet (20 mg total) by mouth daily as needed for erectile dysfunction. 6 tablet 10  . timolol  (BETIMOL) 0.5 % ophthalmic solution Place 1 drop into the right eye at bedtime.     Derrill Memo ON 01/18/2019] ALPRAZolam (XANAX) 0.25 MG tablet Take 1 tablet (0.25 mg total) by mouth once for 1 dose. Take 1 hour before MRI (Patient not taking: Reported on 01/13/2019) 1 tablet 0  . ondansetron (ZOFRAN) 8 MG tablet Take 1 tablet (8 mg total) by mouth 2 (two) times daily as needed for refractory nausea / vomiting. (Patient not taking: Reported on 01/13/2019) 30 tablet 2  . prochlorperazine (COMPAZINE) 10 MG tablet Take 1 tablet (10 mg total) by mouth every 6 (six) hours as needed (Nausea or vomiting). (Patient not taking: Reported on 01/13/2019) 60 tablet 2   No current facility-administered medications for this visit.    OBJECTIVE: There were no vitals filed for this visit.   There is no height or weight on file to calculate BMI.    ECOG FS:0 - Asymptomatic  General: Well-developed, well-nourished, no acute distress. HEENT: Normocephalic. Neuro: Alert, answering all questions appropriately. Cranial nerves grossly intact. Psych: Normal affect.  LAB RESULTS:  Lab Results  Component Value Date   NA 139 12/25/2018   K 4.8 12/25/2018   CL 104 12/25/2018   CO2 27 12/25/2018  GLUCOSE 110 (H) 12/25/2018   BUN 22 12/25/2018   CREATININE 0.91 12/25/2018   CALCIUM 9.8 12/25/2018   PROT 6.9 12/25/2018   ALBUMIN 4.0 10/16/2018   AST 21 12/25/2018   ALT 23 12/25/2018   ALKPHOS 72 10/16/2018   BILITOT 0.4 12/25/2018   GFRNONAA 89 12/25/2018   GFRAA 104 12/25/2018    Lab Results  Component Value Date   WBC 4.9 12/30/2018   NEUTROABS 1.8 10/16/2018   HGB 14.2 12/30/2018   HCT 43.6 12/30/2018   MCV 95.8 12/30/2018   PLT 377 12/30/2018     STUDIES: DG Chest 2 View  Result Date: 12/30/2018 CLINICAL DATA:  Pre-op. Left hilar mass. EXAM: CHEST - 2 VIEW COMPARISON:  Radiographs 11/19/2016. PET-CT 11/04/2018. FINDINGS: Left subclavian Port-A-Cath tip extends to the superior cavoatrial  junction. The heart size and mediastinal contours are stable. 10.5 cm left perihilar mass is unchanged. The lungs are otherwise clear. There is no significant pleural or pericardial effusion. IMPRESSION: 1. Stable 10.5 cm left perihilar mass. No acute findings. 2. The left subclavian Port-A-Cath appears unchanged in position. Electronically Signed   By: Richardean Sale M.D.   On: 12/30/2018 10:57    ASSESSMENT: Recurrent, stage IV rectal adenocarcinoma.  PLAN:    1.  Recurrent, stage IV rectal adenocarcinoma: Patient was initially considered a clinical stage IIIa, but after completing neoadjuvant 5-FU and XRT followed by surgical excision on October 08, 2013 he was noted to be pathologic stage Ia.  He declined adjuvant FOLFOX at that time.  More recently, patient's CEA started trending up and CT of the chest revealed a large 7.7 cm left upper lobe mass biopsy consistent with rectal adenocarcinoma.  Patient completed cycle 6 of FOLFOX plus Avastin on October 16, 2018.  Repeat biopsy did not reveal any evidence of malignancy, but persistent fungal infection.  Patient underwent repeat bronchoscopy on December 30, 2018 which did in fact reveal residual malignancy.  He has an MRI of the brain scheduled for January 18, 2019 and then follow-up with thoracic surgery on January 27, 2019 to possibly schedule surgical resection.  We will base follow-up on final decision on whether to pursue surgical resection or not at the January 4th appointment.   2.  Leukopenia: Mild, monitor. 3.  Hyphae seen on biopsy: Patient patient finished his course of voriconazole on Friday. 4.  Peripheral neuropathy: Resolved.  Monitor. 5.  Cough: Chronic and unchanged.  Continue Tessalon Perles as needed. 6.  Insomnia: Continue Ambien as needed.  I provided 15 minutes of face-to-face video visit time during this encounter which included chart review. Greater than 50% was spent counseling as documented under my assessment &  plan.   Patient expressed understanding and was in agreement with this plan. He also understands that He can call clinic at any time with any questions, concerns, or complaints.    Lloyd Huger, MD   01/13/2019 2:48 PM

## 2019-01-13 ENCOUNTER — Inpatient Hospital Stay: Payer: 59 | Attending: Oncology | Admitting: Oncology

## 2019-01-13 ENCOUNTER — Encounter: Payer: Self-pay | Admitting: Oncology

## 2019-01-13 ENCOUNTER — Encounter: Payer: Self-pay | Admitting: Cardiothoracic Surgery

## 2019-01-13 ENCOUNTER — Other Ambulatory Visit: Payer: Self-pay

## 2019-01-13 DIAGNOSIS — C20 Malignant neoplasm of rectum: Secondary | ICD-10-CM | POA: Diagnosis not present

## 2019-01-13 DIAGNOSIS — Z923 Personal history of irradiation: Secondary | ICD-10-CM | POA: Diagnosis not present

## 2019-01-13 DIAGNOSIS — Z9221 Personal history of antineoplastic chemotherapy: Secondary | ICD-10-CM | POA: Diagnosis not present

## 2019-01-13 DIAGNOSIS — C7802 Secondary malignant neoplasm of left lung: Secondary | ICD-10-CM | POA: Diagnosis not present

## 2019-01-13 NOTE — Progress Notes (Signed)
Patient stated that he had been doing well but he continues to have trouble falling asleep.

## 2019-01-15 ENCOUNTER — Other Ambulatory Visit: Payer: Self-pay

## 2019-01-15 ENCOUNTER — Encounter: Payer: Self-pay | Admitting: Internal Medicine

## 2019-01-15 ENCOUNTER — Ambulatory Visit (INDEPENDENT_AMBULATORY_CARE_PROVIDER_SITE_OTHER): Payer: 59 | Admitting: Internal Medicine

## 2019-01-15 DIAGNOSIS — B49 Unspecified mycosis: Secondary | ICD-10-CM

## 2019-01-15 NOTE — Progress Notes (Signed)
   Subjective:    Patient ID: TRAVAUGHN VUE, male    DOB: [birth year], 63 y.o.   MRN: [id number]  HPI I connected with  Leron Stoffers Nease on 01/15/19 by phone and verified that I am speaking with the correct person using two identifiers.   I discussed the limitations of evaluation and management by telemedicine. The patient expressed understanding and agreed to proceed.  He completed a month of voriconazole without any issues.  Has had a repeat BAL, bronchoscopy by Dr. Servando Snare and no positive cultures to date.  Follow up with him in January to consider resection.    I will have him continue off of the voriconazole.   Last CXR did not show any progression. At this time, I don't see any current indication to continue with the voriconazole.  I will reconsider if there is new culture growth.   I will follow up with him as needed at this point.   10 minutes spent in review of his cultures, plan and follow up     Objective:          Assessment & Plan:

## 2019-01-18 ENCOUNTER — Other Ambulatory Visit: Payer: Self-pay

## 2019-01-18 ENCOUNTER — Ambulatory Visit
Admit: 2019-01-18 | Discharge: 2019-01-18 | Disposition: A | Discharge status: Home / Self Care | Payer: 59 | Attending: Cardiothoracic Surgery | Admitting: Cardiothoracic Surgery

## 2019-01-18 DIAGNOSIS — R911 Solitary pulmonary nodule: Secondary | ICD-10-CM

## 2019-01-18 DIAGNOSIS — R918 Other nonspecific abnormal finding of lung field: Secondary | ICD-10-CM

## 2019-01-20 ENCOUNTER — Other Ambulatory Visit: Payer: Self-pay | Admitting: *Deleted

## 2019-01-20 ENCOUNTER — Other Ambulatory Visit: Payer: Self-pay | Admitting: Cardiothoracic Surgery

## 2019-01-20 DIAGNOSIS — C3490 Malignant neoplasm of unspecified part of unspecified bronchus or lung: Secondary | ICD-10-CM

## 2019-01-20 DIAGNOSIS — C19 Malignant neoplasm of rectosigmoid junction: Secondary | ICD-10-CM

## 2019-01-23 ENCOUNTER — Other Ambulatory Visit: Payer: 59

## 2019-01-24 DIAGNOSIS — C3412 Malignant neoplasm of upper lobe, left bronchus or lung: Secondary | ICD-10-CM

## 2019-01-24 DIAGNOSIS — R918 Other nonspecific abnormal finding of lung field: Secondary | ICD-10-CM

## 2019-01-24 HISTORY — DX: Malignant neoplasm of upper lobe, left bronchus or lung: C34.12

## 2019-01-24 HISTORY — DX: Other nonspecific abnormal finding of lung field: R91.8

## 2019-01-27 ENCOUNTER — Ambulatory Visit (INDEPENDENT_AMBULATORY_CARE_PROVIDER_SITE_OTHER): Payer: 59 | Admitting: Cardiothoracic Surgery

## 2019-01-27 ENCOUNTER — Ambulatory Visit
Admission: RE | Admit: 2019-01-27 | Discharge: 2019-01-27 | Disposition: A | Discharge status: Home / Self Care | Payer: 59 | Source: Ambulatory Visit | Attending: Cardiothoracic Surgery | Admitting: Cardiothoracic Surgery

## 2019-01-27 ENCOUNTER — Other Ambulatory Visit: Payer: Self-pay

## 2019-01-27 VITALS — BP 129/76 | HR 99 | Temp 97.7°F | Ht 75.0 in | Wt 262.0 lb

## 2019-01-27 DIAGNOSIS — C19 Malignant neoplasm of rectosigmoid junction: Secondary | ICD-10-CM

## 2019-01-27 DIAGNOSIS — R918 Other nonspecific abnormal finding of lung field: Secondary | ICD-10-CM | POA: Diagnosis not present

## 2019-01-27 DIAGNOSIS — C189 Malignant neoplasm of colon, unspecified: Secondary | ICD-10-CM | POA: Diagnosis not present

## 2019-01-27 MED ORDER — IOHEXOL 300 MG/ML  SOLN
75.0000 mL | Freq: Once | INTRAMUSCULAR | Status: AC | PRN
  Administered 2019-01-27: 09:00:00 75 mL via INTRAVENOUS

## 2019-01-27 NOTE — Patient Instructions (Signed)
Video-Assisted Thoracic Surgery Video-assisted thoracic surgery (VATS) is a procedure that allows your surgeon to look inside your chest and perform minor procedures as needed. The thoracic area is between the neck and abdomen. VATS is commonly done to:  Study or diagnose problems in the chest.  Remove a tissue sample (biopsy) to be examined.  Put medicines directly into the chest.  Remove collections of fluid, pus, or blood.  Remove tumors.  Remove a part of a lung (lobectomy).  Treat some problems with the spine, including: ? Abnormal curves (scoliosis or kyphosis). ? Breaks (fractures). ? Tumors. VATS is done using thoracoscopy. Thoracoscopy is a procedure in which a thin tube with a light and camera on the end (thoracoscope) is inserted through a small incision in the chest wall. The thoracoscope sends images to a video monitor that your surgeon will use to view the inside of your chest. Tell a health care provider about:  Any allergies you have.  All medicines you are taking, including vitamins, herbs, eye drops, creams, and over-the-counter medicines.  Any problems you or family members have had with anesthetic medicines.  Any surgeries you have had.  Any medical conditions you have.  Any blood disorders you have.  Whether you are pregnant or may be pregnant. What are the risks? Generally, this is a safe procedure. However, problems may occur, including:  Infection.  Severe bleeding (hemorrhage).  Allergic reaction to medicines.  Damage to structures or organs in the chest, such as nerves.  Lung infection (pneumonia).  Inability to complete the procedure. If this is the case, your chest may need to be opened with a large incision (thoracotomy). What happens before the procedure? Medicines  Ask your health care provider about: ? Changing or stopping your regular medicines. This is especially important if you are taking diabetes medicines or blood  thinners. ? Taking medicines such as aspirin and ibuprofen. These medicines can thin your blood. Do not take these medicines before your procedure if your health care provider instructs you not to.  You may be given antibiotic medicine to help prevent infection. Staying hydrated Follow instructions from your health care provider about hydration, which may include:  Up to 2 hours before the procedure - you may continue to drink clear liquids, such as water, clear fruit juice, black coffee, and plain tea. Eating and drinking restrictions Follow instructions from your health care provider about eating and drinking, which may include:  8 hours before the procedure - stop eating heavy meals or foods such as meat, fried foods, or fatty foods.  6 hours before the procedure - stop eating light meals or foods, such as toast or cereal.  6 hours before the procedure - stop drinking milk or drinks that contain milk.  2 hours before the procedure - stop drinking clear liquids. General instructions  Ask your health care provider how your surgical site will be marked or identified.  You may be asked to shower with a germ-killing soap.  You may have a blood or urine sample taken.  You may have imaging tests, such as: ? Chest X-ray. ? Electrocardiogram (ECG). ? Ultrasound. ? CT scan.  Do not use any products that contain nicotine or tobacco for as long as possible before your procedure. These include cigarettes and e-cigarettes. If you need help quitting, ask your health care provider.  Plan to have someone take you home from the hospital or clinic.  If you will be going home right after the procedure, plan  to have someone with you for 24 hours. What happens during the procedure?  To lower your risk of infection: ? Your health care team will wash or sanitize their hands. ? Your skin will be washed with soap.  An IV tube will be inserted into one of your veins.  You will be given one or  more of the following: ? A medicine to make you fall asleep (general anesthetic). ? A medicine to help you relax (sedative). ? A medicine that is injected into an area of your body to numb everything below the injection site (regional anesthetic). This is less common for this procedure. It may be given if you are not able to tolerate general anesthetic.  A thin tube (catheter) will be inserted into your bladder and your tube that carries urine out of your body (urethra). The catheter will drain your urine.  1-4 small incisions will be made in your chest. The number of incisions depends on the purpose of your procedure.  The thoracoscope will be inserted into your incision(s) and used to view the inside of your chest.  One of your lungs will be deflated. This makes it easier for your surgeon to see the area.  Other surgical instruments may be inserted through your incision(s) to perform necessary procedures.  After any procedures are done, your lung will be inflated.  A chest tube may be inserted through an incision to drain excess fluid from the surgical area.  Any remaining incisions will be closed with stitches (sutures) or staples.  A bandage (dressing) may be placed over your incision(s). The procedure may vary among health care providers and hospitals. What happens after the procedure?  Your blood pressure, heart rate, breathing rate, and blood oxygen level will be monitored until the medicines you were given have worn off.  You may have a chest tube draining fluid from the surgical area. The chest tube will be closely monitored for signs of fluid or air buildup in your lungs.  You may continue to receive fluids and medicines through an IV tube.  You may have to wear compression stockings. These stockings help to prevent blood clots and reduce swelling in your legs.  Do not drive for 24 hours if you were given a sedative. Summary  Video-assisted thoracic surgery (VATS) is a  procedure that allows your surgeon to look inside your chest to study or diagnose problems in your thoracic area.  VATS is done by inserting a thin tube with a light and camera on the end (thoracoscope) through a small incision in the chest wall.  After the procedure, you may have a chest tube draining fluid from the surgical area. The chest tube will be closely monitored for signs of fluid or air buildup in your lungs. This information is not intended to replace advice given to you by your health care provider. Make sure you discuss any questions you have with your health care provider. Document Revised: 12/22/2016 Document Reviewed: 12/27/2015 Elsevier Patient Education  [place].  Lung Resection  A lung resection is a procedure to remove part or all of a lung. An entire lung may be removed (pneumonectomy), or only part of it may be removed (lobectomy). A lung resection may be done as an open surgery or as a minimally invasive surgery. A lung resection is most often done to remove a tumor or cancer, but it may be done to treat other conditions. The procedure can relieve symptoms and keep the problem from  getting worse. Tell a health care provider about:  Any allergies you have.  All medicines you are taking, including vitamins, herbs, eye drops, creams, and over-the-counter medicines.  Any problems you or family members have had with anesthetic medicines.  Any blood disorders you have.  Any surgeries you have had.  Any medical conditions you have.  Whether you are pregnant or may be pregnant. What are the risks? Generally, this is a safe procedure. However, problems may occur, including:  Excessive bleeding.  Infection.  Reaction to anesthesia.  Allergic reaction to medicines.  Blood clots.  Injury to a nerve or blood vessel.  Problems breathing.  Heart problems.  Stroke. What happens before the procedure? Staying hydrated Follow instructions from your  health care provider about hydration, which may include:  Up to 2 hours before the procedure - you may continue to drink clear liquids, such as water, clear fruit juice, black coffee, and plain tea. Eating and drinking restrictions Follow instructions from your health care provider about eating and drinking, which may include:  8 hours before the procedure - stop eating heavy meals or foods such as meat, fried foods, or fatty foods.  6 hours before the procedure - stop eating light meals or foods, such as toast or cereal.  6 hours before the procedure - stop drinking milk or drinks that contain milk.  2 hours before the procedure - stop drinking clear liquids. Medicines Ask your health care provider about:  Changing or stopping your regular medicines. This is especially important if you are taking diabetes medicines or blood thinners.  Taking medicines such as aspirin and ibuprofen. These medicines can thin your blood. Do not take these medicines unless your health care provider tells you to take them.  Taking over-the-counter medicines, vitamins, herbs, and supplements. Tests You may have tests done before the procedure, including:  Blood and urine tests.  Imaging tests, such as X-rays, CT, MRI, or PET scans.  Bronchoscopy. In this procedure, a health care provider uses a flexible tube (bronchoscope) to look at the inside of your airways.  Pulmonary function tests. These are done to check how well your lungs work.  Heart testing. This is done to check heart function before the procedure.  Lymph node sampling. This may be done to see if you have a tumor that has spread. General instructions  Plan to have someone take you home from the hospital or clinic.  Plan to have a responsible adult care for you for at least 24 hours after you leave the hospital or clinic. This is important.  Do not use any products that contain nicotine or tobacco for as long as possible before your  procedure. These include cigarettes and e-cigarettes. If you need help quitting, ask your health care provider.  Ask your health care provider what steps will be taken to help prevent infection. These may include: ? Removing hair at the surgery site. ? Washing skin with a germ-killing soap. ? Taking antibiotic medicine. What happens during the procedure?  An IV will be inserted into one of your veins. You will be given one or both of the following: ? A medicine to help you relax (sedative). ? A medicine to make you fall asleep (general anesthetic).  A breathing tube will be placed in your throat.  A thin tube (catheter) may be inserted into the part of your body that drains urine from the bladder (urethra). The catheter will drain your urine.  Your health care provider will  make a large incision on your side (open lung surgery) or several small incisions over your chest area (minimally invasive surgery).  Your health care provider will carefully cut any tissues leading to the area of the lung being treated.  The lung or part of the lung will then be removed. Lymph nodes near the lung may also be removed for testing.  Your health care provider will check inside your chest to make sure there is no bleeding in or around the lungs.  Your health care provider may put tubes into your chest to drain extra fluid and air after surgery.  Your incisions will be closed. This may be done using stitches (sutures), staples, skin glue, or skin tape (adhesive) strips.  A bandage (dressing) may be placed over your incisions. The procedure may vary among health care providers and hospitals. What happens after the procedure?  Your blood pressure, heart rate, breathing rate, and blood oxygen level will be monitored until you leave the hospital or clinic.  Right after surgery, you may: ? Be moved to the intensive care unit (ICU). ? Continue to have a tube to help you breathe or have a urinary  catheter. ? Have an IV for fluids and medicines. ? Remain on a respirator, if assistance is needed to help you breathe. ? Start respiratory therapy in the ICU. This will help your other lung to get strong and stay healthy. ? Be given medicine to help with pain and nausea.  You may have to wear compression stockings. These stockings help to prevent blood clots and reduce swelling in your legs.  As you continue to recover: ? You will be moved to a regular hospital room. Therapy will continue. ? You may be released to go home or to an extended care facility. Summary  A lung resection is a procedure to remove part or all of a lung. It can be done as an open surgery or a minimally invasive surgery.  A lung resection is most often done to remove a tumor or cancer, but it may be done to treat other conditions.  After surgery, respiratory therapy will be prescribed to help your lung recover and become stronger. This information is not intended to replace advice given to you by your health care provider. Make sure you discuss any questions you have with your health care provider. Document Revised: 01/22/2017 Document Reviewed: 01/22/2017 Elsevier Patient Education  2020 [place] Thoracic Surgery, Care After This sheet gives you information about how to care for yourself after your procedure. Your health care provider may also give you more specific instructions. If you have problems or questions, contact your health care provider. What can I expect after the procedure? After the procedure, it is common to have:  Some pain and soreness in your chest.  Pain when breathing in (inhaling) and coughing.  Constipation.  Fatigue.  Difficulty sleeping. Follow these instructions at home: Preventing pneumonia  Take deep breaths or do breathing exercises as instructed by your health care provider. Doing this helps prevent lung infection (pneumonia).  Cough frequently.  Coughing may cause discomfort, but it is important to clear mucus (phlegm) and expand your lungs. If it hurts to cough, hold a pillow against your chest or place the palms of both hands on top of the incision (use splinting) when you cough. This may help relieve discomfort.  If you were given an incentive spirometer, use it as directed. An incentive spirometer is a tool that measures how  well you are filling your lungs with each breath.  Participate in pulmonary rehabilitation as directed by your health care provider. This is a program that combines education, exercise, and support from a team of specialists. The goal is to help you heal and get back to your normal activities as soon as possible. Medicines  Take over-the-counter or prescription medicines only as told by your health care provider.  If you have pain, take pain-relieving medicine before your pain becomes severe. This is important because if your pain is under control, you will be able to breathe and cough more comfortably.  If you were prescribed an antibiotic medicine, take it as told by your health care provider. Do not stop taking the antibiotic even if you start to feel better. Activity  Ask your health care provider what activities are safe for you.  Avoid activities that use your chest muscles for at least 3-4 weeks.  Do not lift anything that is heavier than 10 lb (4.5 kg), or the limit that your health care provider tells you, until he or she says that it is safe. Incision care  Follow instructions from your health care provider about how to take care of your incision(s). Make sure you: ? Wash your hands with soap and water before you change your bandage (dressing). If soap and water are not available, use hand sanitizer. ? Change your dressing as told by your health care provider. ? Leave stitches (sutures), skin glue, or adhesive strips in place. These skin closures may need to stay in place for 2 weeks or longer. If  adhesive strip edges start to loosen and curl up, you may trim the loose edges. Do not remove adhesive strips completely unless your health care provider tells you to do that.  Keep your dressing dry until it has been removed.  Check your incision area every day for signs of infection. Check for: ? Redness, swelling, or pain. ? Fluid or blood. ? Warmth. ? Pus or a bad smell. Bathing  Do not take baths, swim, or use a hot tub until your health care provider approves. You may take showers.  After your dressing has been removed, use soap and water to gently wash your incision area. Do not use anything else to clean your incision(s) unless your health care provider tells you to do this. Driving   Do not drive until your health care provider approves.  Do not drive or use heavy machinery while taking prescription pain medicine. Eating and drinking  Eat a healthy, balanced diet as instructed by your health care provider. A healthy diet includes plenty of fresh fruits and vegetables, whole grains, and low-fat (lean) proteins.  Limit foods that are high in fat and processed sugars, such as fried and sweet foods.  Drink enough fluid to keep your urine clear or pale yellow. General instructions   To prevent or treat constipation while you are taking prescription pain medicine, your health care provider may recommend that you: ? Take over-the-counter or prescription medicines. ? Eat foods that are high in fiber, such as beans, fresh fruits and vegetables, and whole grains.  Do not use any products that contain nicotine or tobacco, such as cigarettes and e-cigarettes. If you need help quitting, ask your health care provider.  Avoid secondhand smoke.  Wear compression stockings as told by your health care provider. These stockings help to prevent blood clots and reduce swelling in your legs.  If you have a chest tube, care for it  as instructed by your health care provider. Do not travel by  airplane during the 2 weeks after your chest tube is removed, or until your health care provider says that this is safe.  Keep all follow-up visits as told by your health care provider. This is important. Contact a health care provider if:  You have redness, swelling, or pain around an incision.  You have fluid or blood coming from an incision.  Your incision area feels warm to the touch.  You have pus or a bad smell coming from an incision.  You have a fever or chills.  You have nausea or vomiting.  You have pain that does not get better with medicine. Get help right away if:  You have chest pain.  Your heart is fluttering or beating rapidly.  You develop a rash.  You have shortness of breath or trouble breathing.  You are confused.  You have trouble speaking.  You feel weak, light-headed, or dizzy.  You faint. Summary  To help prevent lung infection (pneumonia), take deep breaths or do breathing exercises as instructed by your health care provider.  Cough frequently to clear mucus (phlegm) and expand your lungs. If it hurts to cough, hold a pillow against your chest or place the palms of both hands on top of the incision (use splinting) when you cough.  If you have pain, take pain-relieving medicine before your pain becomes severe. This is important because if your pain is under control, you will be able to breathe and cough more comfortably.  Ask your health care provider what activities are safe for you. This information is not intended to replace advice given to you by your health care provider. Make sure you discuss any questions you have with your health care provider. Document Revised: 12/22/2016 Document Reviewed: 12/20/2015 Elsevier Patient Education  2020 [employer].

## 2019-01-27 NOTE — Progress Notes (Signed)
[address]       [place] [address]             [phone]                    [name] [name] [place] Medical Record #[id number] Date of Birth: [birth year]  Referring: [name] Lewandowsky, MD Primary Care: [name] Hartshorn, FNP Primary Cardiologist: No primary care provider on file.  Chief Complaint:    Chief Complaint  Patient presents with  . Lung Mass    f/u after Head CT     History of Present Illness:    [name] [name] 64 y.o. male is seen in the office in follow up for  evaluation of left hilar mass.  The patient presented in April 2015 with a rectal carcinoma, PET scan at that time suggested a large mass at the anal verge extending into the perineal fat with nodal involvement of what right inguinal lymph node and mesorectal lymph nodes.  PET scan in 2015 showed no abnormalities in the chest per report.  He was treated with preoperative radiation and chemotherapy, underwent robot-assisted abdominal perineal resection.  Final pathology showed 1.5 cm tumor, 3 lymph examined all negative, pathologic staging  pT2pN0 . Patient CEA has gone from 3.0 in October 2018 to 5.9 in September 2020. Because of complaint of cough in March 2020 and rising CEA, CT of the chest abdomen and pelvis was performed and subsequent PET scan June 2020.  CT and PET scan showed a large left hilar mass.  Bronchoscopy with biopsy had positive cytology consistent with adenocarcinoma of the colon.  Pathologic biopsy was negative for tumor, but did show:"SEPTATE AND BRANCHING FUNGAL HYPHAE, CONSISTENT WITH FUNGUS BALL".  Subsequent KOH preps and cultures have been negative for fungus.  the patient subsequently had a Port-A-Cath placed and underwent chemotherapy treatment for stage IV colon cancer.   Follow-up PET scan shows persistent left hilar mass with little change in size.  7.6 x 6.6 cm in size with attenuated metabolic activity along the margins SUV 3.1 previously 5.0.   Patient was   referred to me y for consideration of pneumonectomy with dx of "fungus ball"   Patient is a lifelong cigarette non-smoker, he notes occasional cigar and chews tobacco.  He denies any known cardiac history.  He does note some exertional shortness of breath but is able to carry on usual daily activities without symptoms.   Since last seen the patient was seen by infectious disease and started on treatment with antifungal agent.   I had the cytology from [place] reviewed-originally reported as positive cytology consistent with adenocarcinoma the colon, subsequent review by pathologist agreed with a malignant pathology but suggested it may be lung primary not colon.   Pulmonary function studies were done last week FEV1 2.9 predicted 4.17 69% DLCO 21.57 predicted 31.4  68% predicted   Cancer Staging Rectal cancer [facility]) Staging form: Colon and Rectum, AJCC 7th Edition - Clinical stage from 08/05/2014: Stage IVA (T2, N0, M1a) - Signed by [name] Huger, MD on 07/06/2018  Repeat Bronchoscopy and biopsy: 12/2018 SURGICAL PATHOLOGY   THIS IS AN ADDENDUM REPORT CASE: [id number]  PATIENT: [facility]  Surgical Pathology Report  Addendum   Reason for Addendum #1: Additional special stains  Clinical History: left hilar mass (cm)   FINAL MICROSCOPIC DIAGNOSIS:   A. LUNG, LEFT LINGULAR BRONCHUS, BIOPSY:  - Focal adenocarcinoma with extracellular mucin, see comment.  B. LUNG, LEFT APICAL BRONCHUS, BIOPSY:  - Focal adenocarcinoma with extracellular mucin, see comment.   COMMENT:   A. and B. There is a tiny focus of adenocarcinoma associated with large  amounts of extracellular mucin. Immunohistochemistry is positive for  cytokeratin 7 (strong), cytokeratin 20, and CDX-2. The cells are  negative for p40, TTF-1 and cytokeratin 5/6. The history of rectal mass  and immunoprofile would be most consistent with a gastrointestinal  primary. PAS and GMS will be ordered and reported in an  addendum due to  the history of possible Aspergillus on prior samples. Dr. Melina Copa has  reviewed the case. Dr. Servando Snare was paged on 01/02/2019.    GROSS DESCRIPTION:   A. Received in saline are several minute fragments of tan-pink soft  tissue, 1 x 0.5 x 0.1 cm aggregate, submitted in 1 cassette.   B. Received in saline are several minute fragments of tan-pink soft  tissue, 0.8 x 0.5 x 0.1 cm aggregate, submitted in block B1. (AK  12/30/2018)     Final Diagnosis performed by [name] Males, MD.  Electronically signed  01/02/2019  Technical component performed at [facility]. [facility], [place] [address] [place], [place] [address].  Professional component performed at [facility],  [place] [address], [place], [place] [address].    ADDENDUM:   A. and B. PAS and GMS are negative for fungal organisms.                                              Current Activity/ Functional Status:  Patient is independent with mobility/ambulation, transfers, ADL's, IADL's.   Zubrod Score: At the time of surgery this patient's most appropriate activity status/level should be described as: _0     0    Normal activity, no symptoms _1     1    Restricted in physical strenuous activity but ambulatory, able to do out light work _2     2    Ambulatory and capable of self care, unable to do work activities, up and about               >50 % of waking hours                              _3     3    Only limited self care, in bed greater than 50% of waking hours _4     4    Completely disabled, no self care, confined to bed or chair _5     5    Moribund   Past Medical History:  Diagnosis Date  . Anemia   . Cancer [name])    rectal 2015, followed by Oncology, permanent colostomy  . Colostomy in place [facility])   . Dyspnea    on exertion  . History of kidney stones   . Pneumonia   . Seasonal allergies     Past Surgical History:  Procedure  Laterality Date  . BACK SURGERY    . CHOLECYSTECTOMY    . COLON SURGERY    . ENDOBRONCHIAL ULTRASOUND N/A 07/01/2018   Procedure: ENDOBRONCHIAL ULTRASOUND;  Surgeon: Tyler Pita, MD;  Location: [facility] ORS;  Service: Cardiopulmonary;  Laterality: N/A;  . FLEXIBLE BRONCHOSCOPY Left 11/20/2018   Procedure: FLEXIBLE BRONCHOSCOPY;  Surgeon: Tyler Pita, MD;  Location:  [facility] ORS;  Service: Cardiopulmonary;  Laterality: Left;  . KNEE SURGERY Bilateral   . PORT A CATH REVISION Left 08/05/2018   Procedure: PORT A CATH REVISION, REPOSITION LEFT;  Surgeon: Robert Bellow, MD;  Location: [facility] ORS;  Service: General;  Laterality: Left;  . PORT-A-CATH REMOVAL Right 12/02/2014   Procedure: REMOVAL PORT-A-CATH;  Surgeon: Marlyce Huge, MD;  Location: [facility] ORS;  Service: General;  Laterality: Right;  . PORTACATH PLACEMENT    . PORTACATH PLACEMENT Left 07/29/2018   Procedure: INSERTION PORT-A-CATH LEFT;  Surgeon: Robert Bellow, MD;  Location: [facility] ORS;  Service: General;  Laterality: Left;  [name] VIDEO BRONCHOSCOPY WITH ENDOBRONCHIAL ULTRASOUND N/A 12/30/2018   Procedure: VIDEO BRONCHOSCOPY WITH ENDOBRONCHIAL ULTRASOUND;  Surgeon: Grace Isaac, MD;  Location: [facility] OR;  Service: Thoracic;  Laterality: N/A;    Family History  Problem Relation Age of Onset  . Cancer Mother   . Hypertension Mother   . COPD Mother   . COPD Father   . Cancer Father        Prostate CA, on Lupron  . Stroke Maternal Grandmother   . Cancer Paternal Grandmother   . Heart attack Paternal Grandfather   . Cancer Paternal Grandfather      Social History   Tobacco Use  Smoking Status Never Smoker  Smokeless Tobacco Current User  . Types: Chew  Tobacco Comment   might smoke a cigar once every 3 months    Social History   Substance and Sexual Activity  Alcohol Use Yes  . Alcohol/week: 0.0 standard drinks   Comment: RARE     Allergies  Allergen Reactions  . Sulfa Antibiotics Hives    Current  Outpatient Medications  Medication Sig Dispense Refill  . albuterol (VENTOLIN HFA) 108 (90 Base) MCG/ACT inhaler Inhale 2 puffs into the lungs every 6 (six) hours as needed for wheezing or shortness of breath. 18 g 1  . cetirizine (ZYRTEC) 10 MG tablet Take 10 mg by mouth at bedtime.     . chlorpheniramine-HYDROcodone (TUSSIONEX PENNKINETIC ER) 10-8 MG/5ML SUER Take 5 mLs by mouth every 8 (eight) hours as needed for cough. 115 mL 0  . Dextromethorphan-guaiFENesin (MUCINEX DM MAXIMUM STRENGTH) 60-1200 MG TB12 Take 1 tablet by mouth at bedtime as needed (congestion/drainage.).    [name] loperamide (IMODIUM A-D) 2 MG capsule Take 2 mg by mouth as needed for diarrhea or loose stools.     . Multiple Vitamin (MULTIVITAMIN WITH MINERALS) TABS tablet Take 1 tablet by mouth at bedtime.    . ondansetron (ZOFRAN) 8 MG tablet Take 1 tablet (8 mg total) by mouth 2 (two) times daily as needed for refractory nausea / vomiting. 30 tablet 2  . Oxymetazoline HCl (AFRIN EXTRA MOISTURIZING NA) Place 1 spray into the nose 2 (two) times daily as needed (congestion.).    [name] tadalafil (ADCIRCA/CIALIS) 20 MG tablet Take 1 tablet (20 mg total) by mouth daily as needed for erectile dysfunction. 6 tablet 10  . timolol (BETIMOL) 0.5 % ophthalmic solution Place 1 drop into the right eye at bedtime.      No current facility-administered medications for this visit.    Pertinent items are noted in HPI.   Review of Systems:     Cardiac Review of Systems: [Y] = yes  or   [ N ] = no   Chest Pain Florencio.Farrier   ]  Resting SOB [ n ] Exertional SOB  [ y ]  Vertell Limber Florencio.Farrier ]   Pedal  Edema [ n ]    Palpitations [n  ] Syncope  Florencio.Farrier ]   Presyncope [ n ]   General Review of Systems: [Y] = yes [  ]=no Constitional: recent weight change [  ];  Wt loss over the last 3 months [   ] anorexia [  ]; fatigue [  ]; nausea [  ]; night sweats [  ]; fever [  ]; or chills [  ];           Eye : blurred vision [  ]; diplopia [   ]; vision changes [  ];  Amaurosis fugax[   ]; Resp: cough [  ];  wheezing[  ];  hemoptysis[  ]; shortness of breath[  ]; paroxysmal nocturnal dyspnea[  ]; dyspnea on exertion[  ]; or orthopnea[  ];  GI:  gallstones[  ], vomiting[  ];  dysphagia[  ]; melena[  ];  hematochezia [  ]; heartburn[  ];   Hx of  Colonoscopy[  ]; GU: kidney stones [  ]; hematuria[  ];   dysuria [  ];  nocturia[  ];  history of     obstruction [  ]; urinary frequency [  ]             Skin: rash, swelling[  ];, hair loss[  ];  peripheral edema[  ];  or itching[  ]; Musculosketetal: myalgias[  ];  joint swelling[  ];  joint erythema[  ];  joint pain[  ];  back pain[  ];  Heme/Lymph: bruising[  ];  bleeding[  ];  anemia[  ];  Neuro: TIA[  ];  headaches[  ];  stroke[  ];  vertigo[  ];  seizures[  ];   paresthesias[  ];  difficulty walking[  ];  Psych:depression[  ]; anxiety[  ];  Endocrine: diabetes[  ];  thyroid dysfunction[  ];  Immunizations: Flu up to date [  ]; Pneumococcal up to date [  ];  Other:     PHYSICAL EXAMINATION: BP 129/76   Pulse 99   Temp 97.7 F (36.5 C) (Skin)   Ht _0  (1.905 m)   Wt 118.8 kg   SpO2 98% Comment: RA  BMI 32.75 kg/m     General appearance: alert, cooperative and no distress Head: Normocephalic, without obvious abnormality, atraumatic Neck: no adenopathy, no carotid bruit, no JVD, supple, symmetrical, trachea midline and thyroid not enlarged, symmetric, no tenderness/mass/nodules Lymph nodes: Cervical, supraclavicular, and axillary nodes normal. Resp: clear to auscultation bilaterally Cardio: regular rate and rhythm, S1, S2 normal, no murmur, click, rub or gallop GI: soft, non-tender; bowel sounds normal; no masses,  no organomegaly Extremities: extremities normal, atraumatic, no cyanosis or edema Neurologic: Grossly normal  colostomy intact left lower abdomen   Diagnostic Studies & Laboratory data:     Recent Radiology Findings:  CT HEAD W & WO CONTRAST  Result Date: 01/27/2019 CLINICAL DATA:  Metastatic  colon cancer EXAM: CT HEAD WITHOUT AND WITH CONTRAST TECHNIQUE: Contiguous axial images were obtained from the base of the skull through the vertex without and with intravenous contrast CONTRAST:  57m OMNIPAQUE IOHEXOL 300 MG/ML  SOLN COMPARISON:  None. FINDINGS: Brain: There is no acute intracranial hemorrhage, mass, mass-effect, or edema. There is no abnormal enhancement. Gray-white differentiation is preserved. There is no extra-axial fluid collection. Ventricles and sulci are within normal limits in size and configuration. Vascular: Unremarkable. Skull: Calvarium is unremarkable. Sinuses/Orbits: No acute finding. Other: None. IMPRESSION: No  evidence of intracranial metastatic disease. Electronically Signed   By: Macy Mis M.D.   On: 01/27/2019 09:42   Patient was unable to tolerate MRI of brain but did tolerated ct of head today     Nm Pet Image Restag (ps) Skull Base To Thigh Result Date: 11/04/2018 CLINICAL DATA:  Subsequent treatment strategy for rectal cancer. Previous hypermetabolic left upper lobe mass was biopsied with pathology revealing fungus ball. EXAM: NUCLEAR MEDICINE PET SKULL BASE TO THIGH TECHNIQUE: 13.3 mCi F-18 FDG was injected intravenously. Full-ring PET imaging was performed from the skull base to thigh after the radiotracer. CT data was obtained and used for attenuation correction and anatomic localization. Fasting blood glucose: 111 mg/dl COMPARISON:  06/24/2018 FINDINGS: Mediastinal blood pool activity: SUV max 2.7 Liver activity: SUV max NA NECK: No significant abnormal hypermetabolic activity in this region. Incidental CT findings: none CHEST: 7.6 by 6.6 cm left upper lobe lesion has faintly accentuated metabolic activity along its margins, maximum SUV 3.1, previously 5.0. The amount of marginal metabolic activity is significantly reduced. Upon biopsy there is no evidence of malignancy but there was fungal material. Incidental CT findings: Left Port-A-Cath tip: SVC. Mild  atherosclerotic calcification of the aortic arch, left anterior descending, right, and circumflex coronary arteries. ABDOMEN/PELVIS: Accentuated activity in the ascending colon without definite CT correlate, maximum SUV 8.5. This is most likely to be physiologic activity. Small amount of activity along the expected location of the penile urethra, likely excreted urinary FDG. Incidental CT findings: Cholecystectomy. Descending and sigmoid colon diverticulosis. Sigmoid colostomy. Stable postoperative/post therapy presacral findings. Aortoiliac atherosclerotic vascular disease. Small nonobstructive bilateral renal calculi are suspected. SKELETON: No significant abnormal hypermetabolic activity in this region. Incidental CT findings: none IMPRESSION: 1. No findings of active or recurrent malignancy. 2. 7.6 by 6.6 cm left upper lobe lesion with reduced metabolic activity along its margins compared to prior, compatible with known fungus ball. 3. Postoperative/post therapy related presacral findings without compelling evidence of recurrence. 4. Mildly accentuated activity in the ascending colon is without CT correlate and most likely physiologic. 5. Other imaging findings of potential clinical significance: Aortic Atherosclerosis (ICD10-I70.0). Coronary atherosclerosis. Sigmoid colostomy. Descending and sigmoid colon diverticulosis. Bilateral tiny nonobstructive renal calculi. Electronically Signed   By: Van Clines M.D.   On: 11/04/2018 12:58    Dg Chest Port 1 View  Result Date: 11/20/2018 CLINICAL DATA:  Post left-sided flexible bronchoscopy. EXAM: PORTABLE CHEST 1 VIEW COMPARISON:  Chest x-ray 07/29/2018 and PET-CT 11/04/2018 FINDINGS: Left subclavian Port-A-Cath with tip demonstrating interval advancement as tip is now just below the cavoatrial junction. Lungs are somewhat hypoinflated demonstrate no change in known biopsy-proven 9.7 cm fungus ball over the lingula. No evidence of effusion. Subtle  prominence of the infrahilar vessels likely mild vascular congestion. Cardiomediastinal silhouette and remainder of the exam is unchanged. IMPRESSION: Suggestion mild vascular congestion. Stable known biopsy-proven 9.7 cm fungus ball over the lingula. Electronically Signed   By: Marin Olp M.D.   On: 11/20/2018 15:43   Dg C-arm 1-60 Min-no Report  Result Date: 11/20/2018 Fluoroscopy was utilized by the requesting physician.  No radiographic interpretation.     I have independently reviewed the above radiology studies  and reviewed the findings with the patient.    PATH: Cytology - Non PAP CASE: [id number] PATIENT: Trevonte Godbee Non-Gyn Cytology Report  SPECIMEN SUBMITTED: A. Lung, left upper lobe; brushing  CLINICAL HISTORY: 64 year old male with rectal adenocarcinoma treated with chemoradiation and surgery in 2015 who presents  with rising CEA and new LUL mass  PRE-OPERATIVE DIAGNOSIS: Metastatic colorectal carcinoma  POST-OPERATIVE DIAGNOSIS: Same as above   DIAGNOSIS: A.  LUNG, LEFT UPPER LOBE; BRONCHOSCOPY WITH BRUSHING: - POSITIVE FOR MALIGNANCY. - ADENOCARCINOMA CONSISTENT WITH METASTATIC COLORECTAL CARCINOMA IS PRESENT.  See concurrent cases [id number], [id number], and [id number].  There is insufficient material for ancillary molecular testing.  Comment: Sections demonstrate malignant epithelial groups floating within pools of mucin.  Limited panel of immunohistochemical stains was performed with the following pattern of results: Cytokeratin 7: Positive Cytokeratin 20: Positive TTF-1: Negative CDX 2: Posit ive This pattern of immunoreactivity supports the above diagnosis. These findings were communicated to Drs. Odetta Pink on 07/04/2018 via [facility] secure text.    GROSS DESCRIPTION: A. Site: Lung, left upper lobe Procedure: Bronchoscopy Cytotechnologist: Ashlee Howze-Soremekun and Marybeth Anderson Specimen(s) collected: 4 Diff Quik stained  slides Spec imen labeled: Left upper lobe, brush                      Volume: 15 mL                      Description: Clear CytoLyt solution admixed with wispy tissue fragments and 1 collection brush                      Submitted for: ThinPrep and cell block   Final Diagnosis performed by Quay Burow, MD.   Electronically signed 07/04/2018 2:34:08PM    Surgical Pathology  CASE: [id number]  PATIENT: Herberth Pieratt  Surgical Pathology Report    SPECIMEN SUBMITTED:  A. Lung, left upper lobe   CLINICAL HISTORY:  64 year old male with rectal adenocarcinoma treated with chemoradiation  and surgery in 2015 who presents with rising CEA and new LUL mass   PRE-OPERATIVE DIAGNOSIS:  Metastatic colorectal carcinoma   POST-OPERATIVE DIAGNOSIS:  Same as above    DIAGNOSIS:  A. LUNG, LEFT UPPER LOBE; BRONCHOSCOPY WITH BIOPSY:  - SEPTATE AND BRANCHING FUNGAL HYPHAE, CONSISTENT WITH FUNGUS BALL.  - FRAGMENTS OF BENIGN RESPIRATORY MUCOSA.  - NEGATIVE FOR MALIGNANCY.   See concurrent cases [id number], -[id number], and -281.   Comment:  Preliminary findings were communicated to Drs. Patsey Berthold and Grayland Ormond via  [facility] secure text on 07/02/2018 at 9:42 AM. GMS stain was performed and  fails to identify invasive disease. A CDX-2 stain was performed and is  negative.   GROSS DESCRIPTION:  A. Labeled: Left upper lobe, biopsy  Received: The specimen is retrieved via bronchoscopy and placed into  formalin.  Tissue fragment(s): Multiple  Size: Aggregate, 1.0 x 0.5 x 0.1 cm  Description: Received are irregular fragments of tan-pink soft tissue  admixed with minimal, hemorrhagic material. At the time of procedure, 2  Diff-Quik stained slides are performed. The remainder of the specimen  is filtered into a mesh bag and submitted for routine histology in  cassette 1.    Final Diagnosis performed by Quay Burow, MD.  Electronically signed  07/04/2018 12:34:35PM  The electronic signature indicates  that the named Attending Pathologist  has evaluated the specimen  Technical component performed at [facility], [address] [place],  [place] [address] Lab: [phone] Dir: Rush Farmer, MD, MMM Professional  component performed at [facility], [facility], [place], [place], [place] [address] Lab: [phone] Dir: Dellia Nims.  Reuel Derby, MD    Recent Lab Findings: Lab Results  Component Value Date   WBC 4.9 12/30/2018   HGB  14.2 12/30/2018   HCT 43.6 12/30/2018   PLT 377 12/30/2018   GLUCOSE 110 (H) 12/25/2018   CHOL 147 03/22/2018   TRIG 126 03/22/2018   HDL 35 (L) 03/22/2018   LDLCALC 89 03/22/2018   ALT 23 12/25/2018   AST 21 12/25/2018   NA 139 12/25/2018   K 4.8 12/25/2018   CL 104 12/25/2018   CREATININE 0.91 12/25/2018   BUN 22 12/25/2018   CO2 27 12/25/2018   TSH 3.96 07/03/2016   INR 1.0 12/30/2018      Assessment / Plan:   I reviewed with the patient again the pathology results, cytology results, and PFTs.   Follow-up bronchoscopy repeat biopsies.  CT and PET scan findings indicate the most likely diagnosis is isolated metastasis of GI malignancy to the left upper lobe, predominantly mucus producing tumor which is causing unusual findings left upper lobe mass.  With the repeat bronchoscopy it appears that there is sufficient left upper lobe bronchus to consider left upper lobectomy, but discussed with the patient that with no other areas of disease would be reasonable to consider left upper lobectomy for treatment of his advanced age rectal cancer.  I also discussed with him that a pneumonectomy would probably carry to greater risk for what would benefit from a resection.  The patient and his wife had their questions answered, reviewed the CT and bronchoscopy findings.  Patient is willing to proceed with left video-assisted thoracoscopy with lung resection, with the goal left upper lobectomy and node dissection.  The patient and his wife understand  that this may not be curative, but it is the only known active disease/malignancy that the patient currently has.  We will tentatively plan to proceed Monday, January 11.  Following surgical path results we we will review with Dr. Grayland Ormond, further chemo administration versus close surveillance.   Grace Isaac MD      [address] [place], [address] Office [phone]     01/27/2019 5:17 PM

## 2019-01-28 ENCOUNTER — Other Ambulatory Visit: Payer: Self-pay | Admitting: *Deleted

## 2019-01-28 DIAGNOSIS — R918 Other nonspecific abnormal finding of lung field: Secondary | ICD-10-CM

## 2019-01-28 LAB — FUNGUS CULTURE RESULT

## 2019-01-28 LAB — FUNGUS CULTURE WITH STAIN

## 2019-01-28 LAB — FUNGAL ORGANISM REFLEX

## 2019-01-29 NOTE — Progress Notes (Signed)
[place], [place] [address] Phone: [phone] Fax: [phone]  [place], [place] [address] Phone: [phone] Fax: [phone]      Your procedure is scheduled on January 11  Report to [facility] Main Entrance "A" at 0530 A.M., and check in at the Admitting office.  Call this number if you have problems the morning of surgery:  [phone]  Call [phone] if you have any questions prior to your surgery date Monday-Friday 8am-4pm    Remember:  Do not eat or drink after midnight the night before your surgery    Take these medicines the morning of surgery with A SIP OF WATER  acetaminophen (TYLENOL) If needed albuterol (VENTOLIN HFA)  If needed, Please bring all inhalers with you the day of surgery.  Eye drops if needed   7 days prior to surgery STOP taking any Aspirin (unless otherwise instructed by your surgeon), Aleve, Naproxen, Ibuprofen, Motrin, Advil, Goody's, BC's, all herbal medications, fish oil, and all vitamins. diclofenac Sodium (VOLTAREN)     The Morning of Surgery  Do not wear jewelry  Do not wear lotions, powders, or colognes, or deodorant  Men may shave face and neck.  Do not bring valuables to the hospital.  [facility] is not responsible for any belongings or valuables.  If you are a smoker, DO NOT Smoke 24 hours prior to surgery  If you wear a CPAP at night please bring your mask, tubing, and machine the morning of surgery   Remember that you must have someone to transport you home after your surgery, and remain with you for 24 hours if you are discharged the same day.   Please bring cases for contacts, glasses, hearing aids, dentures or bridgework because it cannot be worn into surgery.    Leave your suitcase in the car.  After surgery it may be brought to your  room.  For patients admitted to the hospital, discharge time will be determined by your treatment team.  Patients discharged the day of surgery will not be allowed to drive home.    Special instructions:   [place]- Preparing For Surgery  Before surgery, you can play an important role. Because skin is not sterile, your skin needs to be as free of germs as possible. You can reduce the number of germs on your skin by washing with CHG (chlorahexidine gluconate) Soap before surgery.  CHG is an antiseptic cleaner which kills germs and bonds with the skin to continue killing germs even after washing.    Oral Hygiene is also important to reduce your risk of infection.  Remember - BRUSH YOUR TEETH THE MORNING OF SURGERY WITH YOUR REGULAR TOOTHPASTE  Please do not use if you have an allergy to CHG or antibacterial soaps. If your skin becomes reddened/irritated stop using the CHG.  Do not shave (including legs and underarms) for at least 48 hours prior to first CHG shower. It is OK to shave your face.  Please follow these instructions carefully.   1. Shower the NIGHT BEFORE SURGERY and the MORNING OF SURGERY with CHG Soap.   2. If you chose to wash your hair, wash your hair first as usual with your normal shampoo.  3. After you shampoo, rinse your hair and body thoroughly to remove the shampoo.  4. Use CHG as you would  any other liquid soap. You can apply CHG directly to the skin and wash gently with a scrungie or a clean washcloth.   5. Apply the CHG Soap to your body ONLY FROM THE NECK DOWN.  Do not use on open wounds or open sores. Avoid contact with your eyes, ears, mouth and genitals (private parts). Wash Face and genitals (private parts)  with your normal soap.   6. Wash thoroughly, paying special attention to the area where your surgery will be performed.  7. Thoroughly rinse your body with warm water from the neck down.  8. DO NOT shower/wash with your normal soap after using and  rinsing off the CHG Soap.  9. Pat yourself dry with a CLEAN TOWEL.  10. Wear CLEAN PAJAMAS to bed the night before surgery, wear comfortable clothes the morning of surgery  11. Place CLEAN SHEETS on your bed the night of your first shower and DO NOT SLEEP WITH PETS.    Day of Surgery:  Please shower the morning of surgery with the CHG soap Do not apply any deodorants/lotions. Please wear clean clothes to the hospital/surgery center.   Remember to brush your teeth WITH YOUR REGULAR TOOTHPASTE.   Please read over the following fact sheets that you were given.

## 2019-01-30 ENCOUNTER — Encounter (HOSPITAL_COMMUNITY): Payer: Self-pay

## 2019-01-30 ENCOUNTER — Other Ambulatory Visit: Payer: Self-pay

## 2019-01-30 ENCOUNTER — Other Ambulatory Visit
Admission: RE | Admit: 2019-01-30 | Discharge: 2019-01-30 | Disposition: A | Discharge status: Home / Self Care | Payer: 59 | Source: Ambulatory Visit | Attending: Cardiothoracic Surgery | Admitting: Cardiothoracic Surgery

## 2019-01-30 ENCOUNTER — Encounter (HOSPITAL_COMMUNITY)
Admission: RE | Admit: 2019-01-30 | Discharge: 2019-01-30 | Disposition: A | Discharge status: Home / Self Care | Payer: 59 | Source: Ambulatory Visit | Attending: Cardiothoracic Surgery | Admitting: Cardiothoracic Surgery

## 2019-01-30 DIAGNOSIS — Z20822 Contact with and (suspected) exposure to covid-19: Secondary | ICD-10-CM | POA: Diagnosis not present

## 2019-01-30 DIAGNOSIS — R918 Other nonspecific abnormal finding of lung field: Secondary | ICD-10-CM | POA: Insufficient documentation

## 2019-01-30 DIAGNOSIS — Z01818 Encounter for other preprocedural examination: Secondary | ICD-10-CM | POA: Insufficient documentation

## 2019-01-30 DIAGNOSIS — Z01812 Encounter for preprocedural laboratory examination: Secondary | ICD-10-CM | POA: Diagnosis not present

## 2019-01-30 LAB — COMPREHENSIVE METABOLIC PANEL
ALT: 19 U/L (ref 0–44)
AST: 23 U/L (ref 15–41)
Albumin: 3.7 g/dL (ref 3.5–5.0)
Alkaline Phosphatase: 65 U/L (ref 38–126)
Anion gap: 10 (ref 5–15)
BUN: 20 mg/dL (ref 8–23)
CO2: 21 mmol/L — ABNORMAL LOW (ref 22–32)
Calcium: 9.4 mg/dL (ref 8.9–10.3)
Chloride: 110 mmol/L (ref 98–111)
Creatinine, Ser: 0.85 mg/dL (ref 0.61–1.24)
GFR calc Af Amer: >60 mL/min (ref >60)
GFR calc non Af Amer: >60 mL/min (ref >60)
Glucose, Bld: 105 mg/dL — ABNORMAL HIGH (ref 70–99)
Potassium: 4.5 mmol/L (ref 3.5–5.1)
Sodium: 141 mmol/L (ref 135–145)
Total Bilirubin: 0.6 mg/dL (ref 0.3–1.2)
Total Protein: 6.3 g/dL — ABNORMAL LOW (ref 6.5–8.1)

## 2019-01-30 LAB — BLOOD GAS, ARTERIAL
Acid-base deficit: 0.6 mmol/L (ref 0.0–2.0)
Bicarbonate: 23.3 mmol/L (ref 20.0–28.0)
FIO2: 21
O2 Saturation: 98.2 %
Patient temperature: 37
pCO2 arterial: 36.2 mmHg (ref 32.0–48.0)
pH, Arterial: 7.424 (ref 7.350–7.450)
pO2, Arterial: 106 mmHg (ref 83.0–108.0)

## 2019-01-30 LAB — URINALYSIS, ROUTINE W REFLEX MICROSCOPIC
Bilirubin Urine: NEGATIVE
Glucose, UA: NEGATIVE mg/dL
Hgb urine dipstick: NEGATIVE
Ketones, ur: NEGATIVE mg/dL
Leukocytes,Ua: NEGATIVE
Nitrite: NEGATIVE
Protein, ur: NEGATIVE mg/dL
Specific Gravity, Urine: 1.023 (ref 1.005–1.030)
pH: 5 (ref 5.0–8.0)

## 2019-01-30 LAB — CBC
HCT: 42.3 % (ref 39.0–52.0)
Hemoglobin: 13.5 g/dL (ref 13.0–17.0)
MCH: 30.8 pg (ref 26.0–34.0)
MCHC: 31.9 g/dL (ref 30.0–36.0)
MCV: 96.4 fL (ref 80.0–100.0)
Platelets: 281 10*3/uL (ref 150–400)
RBC: 4.39 MIL/uL (ref 4.22–5.81)
RDW: 13.5 % (ref 11.5–15.5)
WBC: 4 10*3/uL (ref 4.0–10.5)
nRBC: 0 % (ref 0.0–0.2)

## 2019-01-30 LAB — PROTIME-INR
INR: 0.9 (ref 0.8–1.2)
Prothrombin Time: 12.3 seconds (ref 11.4–15.2)

## 2019-01-30 LAB — APTT: aPTT: 28 seconds (ref 24–36)

## 2019-01-30 LAB — SURGICAL PCR SCREEN
MRSA, PCR: NEGATIVE
Staphylococcus aureus: NEGATIVE

## 2019-01-30 LAB — ABO/RH: ABO/RH(D): O NEG

## 2019-01-30 NOTE — Progress Notes (Signed)
PCP - Dr. Raelyn Ensign Cardiologist - Denies Oncologist: Dr Delight Hoh  PPM/ICD - Denies  Chest x-ray - Denies EKG - 01/30/2019 Stress Test - Denies ECHO - Denies Cardiac Cath - Denies  Sleep Study - Denies  Patient states he is not diabetic.  Blood Thinner Instructions: N/A Aspirin Instructions:N/A  ERAS Protcol - No  COVID TEST- Scheduled 01/30/2019   Anesthesia review: No  Patient denies shortness of breath, fever, cough and chest pain at PAT appointment   All instructions explained to the patient, with a verbal understanding of the material. Patient agrees to go over the instructions while at home for a better understanding. Patient also instructed to self quarantine after being tested for COVID-19. The opportunity to ask questions was provided.

## 2019-01-31 ENCOUNTER — Other Ambulatory Visit: Payer: Self-pay | Admitting: *Deleted

## 2019-01-31 LAB — SARS CORONAVIRUS 2 (TAT 6-24 HRS): SARS Coronavirus 2: NEGATIVE

## 2019-01-31 MED ORDER — DEXTROSE 5 % IV SOLN
3.0000 g | INTRAVENOUS | Status: AC
  Administered 2019-02-03: 08:00:00 3 g via INTRAVENOUS
  Administered 2019-02-03: 12:00:00 2 g via INTRAVENOUS
  Filled 2019-01-31: qty 3000
  Filled 2019-01-31: qty 3

## 2019-01-31 NOTE — Patient Outreach (Signed)
[facility]) Care Management  01/31/2019  [name] [name] 04/07/1955 [id number]   Preoperative Screening Call Referral received: 01/30/19 Surgery/procedure date: 02/03/19 Insurance: [employer]   Subjective:  Initial successful telephone call to patient's preferred number in order to complete preoperative screening.No answer able to leave a HIPAA compliant message.   Incoming return call from patient with his wife [name] [name]  on speaker phone.   2 HIPAA identifiers verified. Discussed purpose of preoperative call. Patient voices understanding and agrees to call. He  states he  understands the reason for the surgery and the expected time of arrival, 0530 02/03/19 first case.  He states he  completed his  preoperative testing on 1/7 including covid testing and understands to remain on quarantine .  He says he plans to be in the hospital 5- 6  days.  His wife works Warehouse manager  states she is not eligible for FMLA benefit   and does not have the hospital indemnity plan . His wife will be able to  provide  24 hour care, she is a 35 year experienced  ICU nurse.  He agrees to a post hospital discharge transition of care call.   He discussed his biggest concern is regarding management of his colostomy after surgery states for the last 5 years his wife has been managing colostomy care and they have a good system and does not want to have any problems after surgery. He reports discussing with RN during preadmission testing and he plans to bring his ostomy supplies to the hospital .  He is asking if his wife will be able to change his colostomy or have a ostomy nurse change, expressed being unsure if his wife will be able to change it , encouraged patient/wife  to discuss with  nurse in ICU and they will be able to be supportive of his needs. He discussed doubt  regarding communication  follow through..Reassured patient I would explore a way to note his concern/request in electronic record bedsides  this note  to be able to address his concerns. Under the FYIS section in  epic under general - placed note to address patient request Ostomy Nurse be involved with his colostomy bag change if his  wife is unable to change during his hospital stay.   Objective:  [name] [name] is  scheduled for Video Bronchoscopy, Video assisted Thoracoscopy,lung resection left 1//11/21  at [facility] . He  completed pre-op testing on 01/30/19  Assessment: Preoperative call completed, no preoperative needs identified.   Plan: RNCM will call patient for transition of care outreach within 72 hours of hospital discharge notification. Placed call to Director [name] [name] to discuss patient concern regarding colostomy management after surgery including request regarding his wife changing colostomy or having Ostomy nurse change, she recommended having [name] [name], [facility] Liaison follow up , in basket message sent to her regarding patient request.  Returned call to patient to update him on  communication with Director and  that [facility] hospital Liaison will follow up regarding  patient request/concern.     [name] Draft, RN, [place] Management Coordinator  [phone]- Mobile [phone]- Toll Free Main Office

## 2019-02-02 NOTE — Anesthesia Preprocedure Evaluation (Addendum)
Anesthesia Evaluation  Patient identified by MRN, date of birth, ID band Patient awake    Reviewed: Allergy & Precautions, NPO status , Patient's Chart, lab work & pertinent test results  Airway Mallampati: III  TM Distance: >3 FB Neck ROM: Full    Dental no notable dental hx.    Pulmonary  Tobacco abuse   Pulmonary exam normal breath sounds clear to auscultation       Cardiovascular negative cardio ROS Normal cardiovascular exam Rhythm:Regular Rate:Normal  ECG: NSR, rate 74   Neuro/Psych negative neurological ROS  negative psych ROS   GI/Hepatic Neg liver ROS, Rectal cancer s/p chemo   Endo/Other  negative endocrine ROS  Renal/GU negative Renal ROS     Musculoskeletal negative musculoskeletal ROS (+)   Abdominal (+) + obese,   Peds  Hematology negative hematology ROS (+)   Anesthesia Other Findings left hilar lung mass  Reproductive/Obstetrics                           Anesthesia Physical Anesthesia Plan  ASA: II  Anesthesia Plan: General   Post-op Pain Management:    Induction: Intravenous  PONV Risk Score and Plan: 2 and Ondansetron, Dexamethasone, Midazolam and Treatment may vary due to age or medical condition  Airway Management Planned: Oral ETT and Double Lumen EBT  Additional Equipment: Arterial line  Intra-op Plan:   Post-operative Plan: Extubation in OR  Informed Consent: I have reviewed the patients History and Physical, chart, labs and discussed the procedure including the risks, benefits and alternatives for the proposed anesthesia with the patient or authorized representative who has indicated his/her understanding and acceptance.     Dental advisory given  Plan Discussed with: CRNA  Anesthesia Plan Comments:        Anesthesia Quick Evaluation

## 2019-02-03 ENCOUNTER — Encounter (HOSPITAL_COMMUNITY)
Admission: RE | Disposition: A | Discharge status: Home / Self Care | Payer: Self-pay | Source: Home / Self Care | Attending: Cardiothoracic Surgery

## 2019-02-03 ENCOUNTER — Encounter (HOSPITAL_COMMUNITY): Payer: Self-pay | Admitting: Cardiothoracic Surgery

## 2019-02-03 ENCOUNTER — Other Ambulatory Visit: Payer: Self-pay

## 2019-02-03 ENCOUNTER — Inpatient Hospital Stay (HOSPITAL_COMMUNITY): Payer: 59

## 2019-02-03 ENCOUNTER — Inpatient Hospital Stay (HOSPITAL_COMMUNITY)
Admission: RE | Admit: 2019-02-03 | Discharge: 2019-02-14 | DRG: 164 | Disposition: A | Discharge status: Home / Self Care | Payer: 59 | Attending: Cardiothoracic Surgery | Admitting: Cardiothoracic Surgery

## 2019-02-03 ENCOUNTER — Inpatient Hospital Stay (HOSPITAL_COMMUNITY): Payer: 59 | Admitting: Anesthesiology

## 2019-02-03 ENCOUNTER — Inpatient Hospital Stay (HOSPITAL_COMMUNITY): Payer: 59 | Admitting: Vascular Surgery

## 2019-02-03 DIAGNOSIS — Z87442 Personal history of urinary calculi: Secondary | ICD-10-CM | POA: Diagnosis not present

## 2019-02-03 DIAGNOSIS — C7802 Secondary malignant neoplasm of left lung: Principal | ICD-10-CM | POA: Diagnosis present

## 2019-02-03 DIAGNOSIS — Z882 Allergy status to sulfonamides status: Secondary | ICD-10-CM | POA: Diagnosis not present

## 2019-02-03 DIAGNOSIS — C20 Malignant neoplasm of rectum: Secondary | ICD-10-CM | POA: Diagnosis not present

## 2019-02-03 DIAGNOSIS — S299XXA Unspecified injury of thorax, initial encounter: Secondary | ICD-10-CM | POA: Diagnosis not present

## 2019-02-03 DIAGNOSIS — C3412 Malignant neoplasm of upper lobe, left bronchus or lung: Secondary | ICD-10-CM | POA: Diagnosis not present

## 2019-02-03 DIAGNOSIS — J9811 Atelectasis: Secondary | ICD-10-CM | POA: Diagnosis not present

## 2019-02-03 DIAGNOSIS — K56609 Unspecified intestinal obstruction, unspecified as to partial versus complete obstruction: Secondary | ICD-10-CM

## 2019-02-03 DIAGNOSIS — D62 Acute posthemorrhagic anemia: Secondary | ICD-10-CM | POA: Diagnosis not present

## 2019-02-03 DIAGNOSIS — I973 Postprocedural hypertension: Secondary | ICD-10-CM | POA: Diagnosis not present

## 2019-02-03 DIAGNOSIS — Z825 Family history of asthma and other chronic lower respiratory diseases: Secondary | ICD-10-CM | POA: Diagnosis not present

## 2019-02-03 DIAGNOSIS — D649 Anemia, unspecified: Secondary | ICD-10-CM | POA: Diagnosis not present

## 2019-02-03 DIAGNOSIS — Z8042 Family history of malignant neoplasm of prostate: Secondary | ICD-10-CM | POA: Diagnosis not present

## 2019-02-03 DIAGNOSIS — R109 Unspecified abdominal pain: Secondary | ICD-10-CM | POA: Diagnosis not present

## 2019-02-03 DIAGNOSIS — K30 Functional dyspepsia: Secondary | ICD-10-CM | POA: Diagnosis not present

## 2019-02-03 DIAGNOSIS — R066 Hiccough: Secondary | ICD-10-CM | POA: Diagnosis not present

## 2019-02-03 DIAGNOSIS — J95811 Postprocedural pneumothorax: Secondary | ICD-10-CM | POA: Diagnosis not present

## 2019-02-03 DIAGNOSIS — R918 Other nonspecific abnormal finding of lung field: Secondary | ICD-10-CM | POA: Diagnosis present

## 2019-02-03 DIAGNOSIS — I471 Supraventricular tachycardia: Secondary | ICD-10-CM | POA: Diagnosis not present

## 2019-02-03 DIAGNOSIS — Z79899 Other long term (current) drug therapy: Secondary | ICD-10-CM

## 2019-02-03 DIAGNOSIS — Z4682 Encounter for fitting and adjustment of non-vascular catheter: Secondary | ICD-10-CM | POA: Diagnosis not present

## 2019-02-03 DIAGNOSIS — H409 Unspecified glaucoma: Secondary | ICD-10-CM | POA: Diagnosis not present

## 2019-02-03 DIAGNOSIS — F17290 Nicotine dependence, other tobacco product, uncomplicated: Secondary | ICD-10-CM | POA: Diagnosis present

## 2019-02-03 DIAGNOSIS — R079 Chest pain, unspecified: Secondary | ICD-10-CM | POA: Diagnosis not present

## 2019-02-03 DIAGNOSIS — Z9221 Personal history of antineoplastic chemotherapy: Secondary | ICD-10-CM

## 2019-02-03 DIAGNOSIS — Z885 Allergy status to narcotic agent status: Secondary | ICD-10-CM | POA: Diagnosis not present

## 2019-02-03 DIAGNOSIS — R140 Abdominal distension (gaseous): Secondary | ICD-10-CM | POA: Diagnosis not present

## 2019-02-03 DIAGNOSIS — Z923 Personal history of irradiation: Secondary | ICD-10-CM

## 2019-02-03 DIAGNOSIS — Z823 Family history of stroke: Secondary | ICD-10-CM | POA: Diagnosis not present

## 2019-02-03 DIAGNOSIS — Z8249 Family history of ischemic heart disease and other diseases of the circulatory system: Secondary | ICD-10-CM

## 2019-02-03 DIAGNOSIS — K567 Ileus, unspecified: Secondary | ICD-10-CM | POA: Diagnosis not present

## 2019-02-03 DIAGNOSIS — Z933 Colostomy status: Secondary | ICD-10-CM

## 2019-02-03 DIAGNOSIS — Z9689 Presence of other specified functional implants: Secondary | ICD-10-CM

## 2019-02-03 DIAGNOSIS — R222 Localized swelling, mass and lump, trunk: Secondary | ICD-10-CM | POA: Diagnosis not present

## 2019-02-03 DIAGNOSIS — Z01818 Encounter for other preprocedural examination: Secondary | ICD-10-CM | POA: Diagnosis not present

## 2019-02-03 DIAGNOSIS — K56600 Partial intestinal obstruction, unspecified as to cause: Secondary | ICD-10-CM | POA: Diagnosis not present

## 2019-02-03 DIAGNOSIS — R040 Epistaxis: Secondary | ICD-10-CM | POA: Diagnosis not present

## 2019-02-03 DIAGNOSIS — Z85048 Personal history of other malignant neoplasm of rectum, rectosigmoid junction, and anus: Secondary | ICD-10-CM | POA: Diagnosis not present

## 2019-02-03 DIAGNOSIS — J939 Pneumothorax, unspecified: Secondary | ICD-10-CM | POA: Diagnosis not present

## 2019-02-03 DIAGNOSIS — C771 Secondary and unspecified malignant neoplasm of intrathoracic lymph nodes: Secondary | ICD-10-CM | POA: Diagnosis not present

## 2019-02-03 DIAGNOSIS — Z09 Encounter for follow-up examination after completed treatment for conditions other than malignant neoplasm: Secondary | ICD-10-CM

## 2019-02-03 DIAGNOSIS — K5669 Other partial intestinal obstruction: Secondary | ICD-10-CM | POA: Diagnosis not present

## 2019-02-03 HISTORY — PX: VIDEO BRONCHOSCOPY: SHX5072

## 2019-02-03 HISTORY — PX: THORACOTOMY/LOBECTOMY: SHX6116

## 2019-02-03 HISTORY — PX: VIDEO ASSISTED THORACOSCOPY (VATS)/WEDGE RESECTION: SHX6174

## 2019-02-03 HISTORY — PX: LOBECTOMY: SHX5089

## 2019-02-03 HISTORY — PX: INTERCOSTAL NERVE BLOCK: SHX5021

## 2019-02-03 LAB — PREPARE RBC (CROSSMATCH)

## 2019-02-03 SURGERY — BRONCHOSCOPY, VIDEO-ASSISTED
Anesthesia: General | Site: Chest

## 2019-02-03 MED ORDER — SODIUM CHLORIDE 0.9% IV SOLUTION: Freq: Once | INTRAVENOUS | Status: DC

## 2019-02-03 MED ORDER — ONDANSETRON HCL 4 MG/2ML IJ SOLN: 4.0000 mg | Freq: Once | INTRAMUSCULAR | Status: DC | PRN

## 2019-02-03 MED ORDER — 0.9 % SODIUM CHLORIDE (POUR BTL) OPTIME
TOPICAL | Status: DC | PRN
  Administered 2019-02-03: 10:00:00 2000 mL
  Administered 2019-02-03 (×2): 1000 mL

## 2019-02-03 MED ORDER — CEFAZOLIN SODIUM-DEXTROSE 2-4 GM/100ML-% IV SOLN
2.0000 g | Freq: Three times a day (TID) | INTRAVENOUS | Status: AC
  Administered 2019-02-03 – 2019-02-04 (×2): 2 g via INTRAVENOUS
  Filled 2019-02-03 (×2): qty 100

## 2019-02-03 MED ORDER — DEXAMETHASONE SODIUM PHOSPHATE 10 MG/ML IJ SOLN
INTRAMUSCULAR | Status: AC
  Filled 2019-02-03: qty 1

## 2019-02-03 MED ORDER — BUPIVACAINE HCL (PF) 0.5 % IJ SOLN
INTRAMUSCULAR | Status: AC
  Filled 2019-02-03: qty 30

## 2019-02-03 MED ORDER — CALCIUM CHLORIDE 10 % IV SOLN
INTRAVENOUS | Status: AC
  Filled 2019-02-03: qty 10

## 2019-02-03 MED ORDER — HEMOSTATIC AGENTS (NO CHARGE) OPTIME
TOPICAL | Status: DC | PRN
  Administered 2019-02-03 (×2): 1 via TOPICAL

## 2019-02-03 MED ORDER — PROPOFOL 10 MG/ML IV BOLUS
INTRAVENOUS | Status: DC | PRN
  Administered 2019-02-03: 200 mg via INTRAVENOUS

## 2019-02-03 MED ORDER — PHENYLEPHRINE 40 MCG/ML (10ML) SYRINGE FOR IV PUSH (FOR BLOOD PRESSURE SUPPORT)
PREFILLED_SYRINGE | INTRAVENOUS | Status: AC
  Filled 2019-02-03: qty 10

## 2019-02-03 MED ORDER — CHLORHEXIDINE GLUCONATE CLOTH 2 % EX PADS
6.0000 | MEDICATED_PAD | Freq: Every day | CUTANEOUS | Status: DC
  Administered 2019-02-05 – 2019-02-14 (×8): 6 via TOPICAL

## 2019-02-03 MED ORDER — BISACODYL 5 MG PO TBEC
10.0000 mg | DELAYED_RELEASE_TABLET | Freq: Every day | ORAL | Status: DC
  Filled 2019-02-03 (×3): qty 2

## 2019-02-03 MED ORDER — TRAMADOL HCL 50 MG PO TABS
50.0000 mg | ORAL_TABLET | Freq: Four times a day (QID) | ORAL | Status: DC | PRN
  Administered 2019-02-04 – 2019-02-05 (×6): 100 mg via ORAL
  Filled 2019-02-03 (×6): qty 2

## 2019-02-03 MED ORDER — NALOXONE HCL 0.4 MG/ML IJ SOLN: 0.4000 mg | INTRAMUSCULAR | Status: DC | PRN

## 2019-02-03 MED ORDER — ACETAMINOPHEN 10 MG/ML IV SOLN
INTRAVENOUS | Status: DC | PRN
  Administered 2019-02-03: 1000 mg via INTRAVENOUS

## 2019-02-03 MED ORDER — OXYCODONE HCL 5 MG/5ML PO SOLN: 5.0000 mg | Freq: Once | ORAL | Status: DC | PRN

## 2019-02-03 MED ORDER — FENTANYL CITRATE (PF) 250 MCG/5ML IJ SOLN
INTRAMUSCULAR | Status: AC
  Filled 2019-02-03: qty 5

## 2019-02-03 MED ORDER — ALBUMIN HUMAN 5 % IV SOLN: INTRAVENOUS | Status: DC | PRN

## 2019-02-03 MED ORDER — MIDAZOLAM HCL 2 MG/2ML IJ SOLN
INTRAMUSCULAR | Status: AC
  Filled 2019-02-03: qty 2

## 2019-02-03 MED ORDER — FENTANYL CITRATE (PF) 100 MCG/2ML IJ SOLN
INTRAMUSCULAR | Status: AC
  Filled 2019-02-03: qty 2

## 2019-02-03 MED ORDER — SENNOSIDES-DOCUSATE SODIUM 8.6-50 MG PO TABS
1.0000 | ORAL_TABLET | Freq: Every day | ORAL | Status: DC
  Administered 2019-02-05: 1 via ORAL
  Filled 2019-02-03 (×2): qty 1

## 2019-02-03 MED ORDER — OXYCODONE HCL 5 MG PO TABS: 5.0000 mg | ORAL_TABLET | Freq: Once | ORAL | Status: DC | PRN

## 2019-02-03 MED ORDER — OXYCODONE HCL 5 MG PO TABS
5.0000 mg | ORAL_TABLET | ORAL | Status: DC | PRN
  Administered 2019-02-06: 10 mg via ORAL
  Filled 2019-02-03: qty 2

## 2019-02-03 MED ORDER — DEXTROSE 50 % IV SOLN
INTRAVENOUS | Status: DC | PRN
  Administered 2019-02-03: .5 via INTRAVENOUS

## 2019-02-03 MED ORDER — DIPHENHYDRAMINE HCL 50 MG/ML IJ SOLN: 12.5000 mg | Freq: Four times a day (QID) | INTRAMUSCULAR | Status: DC | PRN

## 2019-02-03 MED ORDER — HYDROMORPHONE HCL 1 MG/ML IJ SOLN
INTRAMUSCULAR | Status: AC
  Filled 2019-02-03: qty 0.5

## 2019-02-03 MED ORDER — DEXTROSE-NACL 5-0.9 % IV SOLN: INTRAVENOUS | Status: DC

## 2019-02-03 MED ORDER — ACETAMINOPHEN 160 MG/5ML PO SOLN
1000.0000 mg | Freq: Four times a day (QID) | ORAL | Status: AC
  Administered 2019-02-08: 1000 mg via ORAL
  Filled 2019-02-03: qty 40.6

## 2019-02-03 MED ORDER — SODIUM CHLORIDE FLUSH 0.9 % IV SOLN
INTRAVENOUS | Status: DC | PRN
  Administered 2019-02-03: 70 mL

## 2019-02-03 MED ORDER — DEXAMETHASONE SODIUM PHOSPHATE 10 MG/ML IJ SOLN
INTRAMUSCULAR | Status: DC | PRN
  Administered 2019-02-03: 10 mg via INTRAVENOUS

## 2019-02-03 MED ORDER — MIDAZOLAM HCL 5 MG/5ML IJ SOLN
INTRAMUSCULAR | Status: DC | PRN
  Administered 2019-02-03: 2 mg via INTRAVENOUS

## 2019-02-03 MED ORDER — ONDANSETRON HCL 4 MG/2ML IJ SOLN
INTRAMUSCULAR | Status: DC | PRN
  Administered 2019-02-03: 4 mg via INTRAVENOUS

## 2019-02-03 MED ORDER — PHENYLEPHRINE HCL-NACL 10-0.9 MG/250ML-% IV SOLN
INTRAVENOUS | Status: DC | PRN
  Administered 2019-02-03: 30 ug/min via INTRAVENOUS
  Administered 2019-02-03: 20 ug/min via INTRAVENOUS
  Administered 2019-02-03: 30 ug/min via INTRAVENOUS

## 2019-02-03 MED ORDER — ROCURONIUM BROMIDE 10 MG/ML (PF) SYRINGE
PREFILLED_SYRINGE | INTRAVENOUS | Status: AC
  Filled 2019-02-03: qty 10

## 2019-02-03 MED ORDER — ALBUTEROL SULFATE HFA 108 (90 BASE) MCG/ACT IN AERS
INHALATION_SPRAY | RESPIRATORY_TRACT | Status: AC
  Filled 2019-02-03: qty 6.7

## 2019-02-03 MED ORDER — ROCURONIUM BROMIDE 10 MG/ML (PF) SYRINGE
PREFILLED_SYRINGE | INTRAVENOUS | Status: DC | PRN
  Administered 2019-02-03: 40 mg via INTRAVENOUS
  Administered 2019-02-03 (×4): 50 mg via INTRAVENOUS
  Administered 2019-02-03: 60 mg via INTRAVENOUS
  Administered 2019-02-03 (×2): 20 mg via INTRAVENOUS
  Administered 2019-02-03: 50 mg via INTRAVENOUS

## 2019-02-03 MED ORDER — PROPOFOL 10 MG/ML IV BOLUS
INTRAVENOUS | Status: AC
  Filled 2019-02-03: qty 20

## 2019-02-03 MED ORDER — BUPIVACAINE LIPOSOME 1.3 % IJ SUSP
20.0000 mL | INTRAMUSCULAR | Status: DC
  Filled 2019-02-03: qty 20

## 2019-02-03 MED ORDER — ALBUTEROL SULFATE (2.5 MG/3ML) 0.083% IN NEBU
2.5000 mg | INHALATION_SOLUTION | RESPIRATORY_TRACT | Status: DC
  Administered 2019-02-03 – 2019-02-04 (×2): 2.5 mg via RESPIRATORY_TRACT
  Filled 2019-02-03 (×2): qty 3

## 2019-02-03 MED ORDER — LIDOCAINE 2% (20 MG/ML) 5 ML SYRINGE
INTRAMUSCULAR | Status: DC | PRN
  Administered 2019-02-03: 40 mg via INTRAVENOUS

## 2019-02-03 MED ORDER — LORATADINE 10 MG PO TABS
10.0000 mg | ORAL_TABLET | Freq: Every day | ORAL | Status: DC
  Administered 2019-02-04 – 2019-02-05 (×2): 10 mg via ORAL
  Filled 2019-02-03 (×3): qty 1

## 2019-02-03 MED ORDER — SODIUM CHLORIDE 0.9% FLUSH: 9.0000 mL | INTRAVENOUS | Status: DC | PRN

## 2019-02-03 MED ORDER — ONDANSETRON HCL 4 MG/2ML IJ SOLN
INTRAMUSCULAR | Status: AC
  Filled 2019-02-03: qty 2

## 2019-02-03 MED ORDER — SUGAMMADEX SODIUM 200 MG/2ML IV SOLN
INTRAVENOUS | Status: DC | PRN
  Administered 2019-02-03: 100 mg via INTRAVENOUS
  Administered 2019-02-03: 200 mg via INTRAVENOUS

## 2019-02-03 MED ORDER — INSULIN ASPART 100 UNIT/ML ~~LOC~~ SOLN
SUBCUTANEOUS | Status: DC | PRN
  Administered 2019-02-03: 5 [IU] via SUBCUTANEOUS

## 2019-02-03 MED ORDER — FENTANYL CITRATE (PF) 100 MCG/2ML IJ SOLN
25.0000 ug | INTRAMUSCULAR | Status: DC | PRN
  Administered 2019-02-03 (×2): 50 ug via INTRAVENOUS

## 2019-02-03 MED ORDER — FENTANYL 40 MCG/ML IV SOLN
INTRAVENOUS | Status: DC
  Administered 2019-02-03: 1000 ug via INTRAVENOUS
  Administered 2019-02-03: 165 ug via INTRAVENOUS
  Administered 2019-02-04: 75 ug via INTRAVENOUS
  Administered 2019-02-04: 195 ug via INTRAVENOUS
  Administered 2019-02-04: 135 ug via INTRAVENOUS
  Administered 2019-02-04: 180 ug via INTRAVENOUS
  Administered 2019-02-05: 105 ug via INTRAVENOUS
  Administered 2019-02-05: 135 ug via INTRAVENOUS
  Administered 2019-02-05: 150 ug via INTRAVENOUS
  Administered 2019-02-05: 195 ug via INTRAVENOUS
  Administered 2019-02-05: 75 ug via INTRAVENOUS
  Administered 2019-02-05: 130 ug via INTRAVENOUS
  Administered 2019-02-06 (×2): 45 ug via INTRAVENOUS
  Filled 2019-02-03 (×4): qty 25

## 2019-02-03 MED ORDER — LIDOCAINE 2% (20 MG/ML) 5 ML SYRINGE
INTRAMUSCULAR | Status: AC
  Filled 2019-02-03: qty 5

## 2019-02-03 MED ORDER — HYDROMORPHONE HCL 1 MG/ML IJ SOLN
INTRAMUSCULAR | Status: DC | PRN
  Administered 2019-02-03: .5 mg via INTRAVENOUS

## 2019-02-03 MED ORDER — SODIUM CHLORIDE 0.9 % IV SOLN: INTRAVENOUS | Status: DC | PRN

## 2019-02-03 MED ORDER — DEXTROSE 50 % IV SOLN
INTRAVENOUS | Status: AC
  Filled 2019-02-03: qty 50

## 2019-02-03 MED ORDER — ONDANSETRON HCL 4 MG/2ML IJ SOLN
4.0000 mg | Freq: Four times a day (QID) | INTRAMUSCULAR | Status: DC | PRN
  Administered 2019-02-05 – 2019-02-06 (×2): 4 mg via INTRAVENOUS
  Filled 2019-02-03 (×2): qty 2

## 2019-02-03 MED ORDER — ACETAMINOPHEN 500 MG PO TABS
1000.0000 mg | ORAL_TABLET | Freq: Four times a day (QID) | ORAL | Status: AC
  Administered 2019-02-03 – 2019-02-05 (×9): 1000 mg via ORAL
  Filled 2019-02-03 (×10): qty 2

## 2019-02-03 MED ORDER — ACETAMINOPHEN 500 MG PO TABS
1000.0000 mg | ORAL_TABLET | Freq: Once | ORAL | Status: AC
  Administered 2019-02-03: 1000 mg via ORAL
  Filled 2019-02-03: qty 2

## 2019-02-03 MED ORDER — ACETAMINOPHEN 10 MG/ML IV SOLN
INTRAVENOUS | Status: AC
  Filled 2019-02-03: qty 100

## 2019-02-03 MED ORDER — FENTANYL CITRATE (PF) 250 MCG/5ML IJ SOLN
INTRAMUSCULAR | Status: DC | PRN
  Administered 2019-02-03: 50 ug via INTRAVENOUS
  Administered 2019-02-03: 25 ug via INTRAVENOUS
  Administered 2019-02-03 (×4): 50 ug via INTRAVENOUS
  Administered 2019-02-03: 25 ug via INTRAVENOUS
  Administered 2019-02-03: 75 ug via INTRAVENOUS
  Administered 2019-02-03: 25 ug via INTRAVENOUS
  Administered 2019-02-03 (×2): 50 ug via INTRAVENOUS

## 2019-02-03 MED ORDER — TIMOLOL MALEATE 0.5 % OP SOLN
1.0000 [drp] | Freq: Every day | OPHTHALMIC | Status: DC
  Administered 2019-02-03 – 2019-02-13 (×11): 1 [drp] via OPHTHALMIC
  Filled 2019-02-03: qty 5

## 2019-02-03 MED ORDER — LACTATED RINGERS IV SOLN: INTRAVENOUS | Status: DC | PRN

## 2019-02-03 MED ORDER — TIMOLOL HEMIHYDRATE 0.5 % OP SOLN: 1.0000 [drp] | Freq: Every day | OPHTHALMIC | Status: DC

## 2019-02-03 MED ORDER — CALCIUM CHLORIDE 10 % IV SOLN
INTRAVENOUS | Status: DC | PRN
  Administered 2019-02-03 (×2): 200 mg via INTRAVENOUS

## 2019-02-03 MED ORDER — PHENYLEPHRINE 40 MCG/ML (10ML) SYRINGE FOR IV PUSH (FOR BLOOD PRESSURE SUPPORT)
PREFILLED_SYRINGE | INTRAVENOUS | Status: DC | PRN
  Administered 2019-02-03: 40 ug via INTRAVENOUS
  Administered 2019-02-03: 80 ug via INTRAVENOUS
  Administered 2019-02-03: 40 ug via INTRAVENOUS
  Administered 2019-02-03: 120 ug via INTRAVENOUS

## 2019-02-03 MED ORDER — ONDANSETRON HCL 4 MG/2ML IJ SOLN
4.0000 mg | Freq: Four times a day (QID) | INTRAMUSCULAR | Status: DC | PRN
  Administered 2019-02-06 – 2019-02-13 (×16): 4 mg via INTRAVENOUS
  Filled 2019-02-03 (×16): qty 2

## 2019-02-03 MED ORDER — ALBUTEROL SULFATE HFA 108 (90 BASE) MCG/ACT IN AERS
INHALATION_SPRAY | RESPIRATORY_TRACT | Status: DC | PRN
  Administered 2019-02-03: 6 via RESPIRATORY_TRACT

## 2019-02-03 MED ORDER — DIPHENHYDRAMINE HCL 12.5 MG/5ML PO ELIX
12.5000 mg | ORAL_SOLUTION | Freq: Four times a day (QID) | ORAL | Status: DC | PRN
  Filled 2019-02-03: qty 5

## 2019-02-03 SURGICAL SUPPLY — 121 items
ADAPTER VALVE BIOPSY EBUS (MISCELLANEOUS) IMPLANT
ADPTR VALVE BIOPSY EBUS (MISCELLANEOUS)
APPLICATOR COTTON TIP 6 STRL (MISCELLANEOUS) ×4 IMPLANT
APPLICATOR COTTON TIP 6IN STRL (MISCELLANEOUS) ×6
APPLICATOR TIP COSEAL (VASCULAR PRODUCTS) IMPLANT
APPLICATOR TIP EXT COSEAL (VASCULAR PRODUCTS) IMPLANT
BIT DRILL 2.0 LONG 140 (BIT) ×3 IMPLANT
BLADE CLIPPER SURG (BLADE) IMPLANT
BRUSH CYTOL CELLEBRITY 1.5X140 (MISCELLANEOUS) IMPLANT
CANISTER SUCT 3000ML PPV (MISCELLANEOUS) ×3 IMPLANT
CATH THORACIC 28FR (CATHETERS) ×3 IMPLANT
CATH THORACIC 36FR (CATHETERS) IMPLANT
CATH THORACIC 36FR RT ANG (CATHETERS) IMPLANT
CLIP VESOCCLUDE MED 6/CT (CLIP) ×6 IMPLANT
CONN ST 1/4X3/8  BEN (MISCELLANEOUS) ×1
CONN ST 1/4X3/8 BEN (MISCELLANEOUS) ×2 IMPLANT
CONT SPEC 4OZ CLIKSEAL STRL BL (MISCELLANEOUS) ×27 IMPLANT
COVER BACK TABLE 60X90IN (DRAPES) ×3 IMPLANT
COVER MAYO STAND STRL (DRAPES) ×3 IMPLANT
DEFOGGER SCOPE WARMER CLEARIFY (MISCELLANEOUS) IMPLANT
DERMABOND ADVANCED (GAUZE/BANDAGES/DRESSINGS) ×1
DERMABOND ADVANCED .7 DNX12 (GAUZE/BANDAGES/DRESSINGS) ×2 IMPLANT
DISSECTOR BLUNT TIP ENDO 5MM (MISCELLANEOUS) IMPLANT
DRAIN CHANNEL 28F RND 3/8 FF (WOUND CARE) ×3 IMPLANT
DRAIN CHANNEL 32F RND 10.7 FF (WOUND CARE) IMPLANT
DRAPE CV SPLIT W-CLR ANES SCRN (DRAPES) ×3 IMPLANT
DRAPE LAPAROSCOPIC ABDOMINAL (DRAPES) IMPLANT
DRAPE ORTHO SPLIT 77X108 STRL (DRAPES) ×1
DRAPE SURG ORHT 6 SPLT 77X108 (DRAPES) ×2 IMPLANT
DRSG AQUACEL AG ADV 3.5X 6 (GAUZE/BANDAGES/DRESSINGS) ×3 IMPLANT
ELECT BLADE 4.0 EZ CLEAN MEGAD (MISCELLANEOUS) ×3
ELECT BLADE 6.5 EXT (BLADE) IMPLANT
ELECT REM PT RETURN 9FT ADLT (ELECTROSURGICAL) ×3
ELECTRODE BLDE 4.0 EZ CLN MEGD (MISCELLANEOUS) ×2 IMPLANT
ELECTRODE REM PT RTRN 9FT ADLT (ELECTROSURGICAL) ×2 IMPLANT
FELT TEFLON 1X6 (MISCELLANEOUS) ×3 IMPLANT
FORCEPS BIOP RJ4 1.8 (CUTTING FORCEPS) IMPLANT
GAUZE SPONGE 4X4 12PLY STRL (GAUZE/BANDAGES/DRESSINGS) ×3 IMPLANT
GLOVE BIO SURGEON STRL SZ 6.5 (GLOVE) ×21 IMPLANT
GLOVE BIO SURGEON STRL SZ7.5 (GLOVE) ×3 IMPLANT
GLOVE BIOGEL PI IND STRL 6.5 (GLOVE) ×6 IMPLANT
GLOVE BIOGEL PI IND STRL 8 (GLOVE) ×2 IMPLANT
GLOVE BIOGEL PI INDICATOR 6.5 (GLOVE) ×3
GLOVE BIOGEL PI INDICATOR 8 (GLOVE) ×1
GLOVE ECLIPSE 8.0 STRL XLNG CF (GLOVE) ×3 IMPLANT
GOWN STRL REUS W/ TWL LRG LVL3 (GOWN DISPOSABLE) ×10 IMPLANT
GOWN STRL REUS W/TWL 2XL LVL3 (GOWN DISPOSABLE) ×3 IMPLANT
GOWN STRL REUS W/TWL LRG LVL3 (GOWN DISPOSABLE) ×5
HANDLE STAPLE  ENDO EGIA 4 STD (STAPLE) ×2
HANDLE STAPLE ENDO EGIA 4 STD (STAPLE) ×4 IMPLANT
KIT BASIN OR (CUSTOM PROCEDURE TRAY) ×3 IMPLANT
KIT CLEAN ENDO COMPLIANCE (KITS) ×3 IMPLANT
KIT SUCTION CATH 14FR (SUCTIONS) ×3 IMPLANT
KIT TURNOVER KIT B (KITS) ×3 IMPLANT
LIGASURE LAP MARYLAND 5MM 37CM (ELECTROSURGICAL) ×3 IMPLANT
MARKER SKIN DUAL TIP RULER LAB (MISCELLANEOUS) ×3 IMPLANT
NEEDLE SPNL 18GX3.5 QUINCKE PK (NEEDLE) ×3 IMPLANT
NS IRRIG 1000ML POUR BTL (IV SOLUTION) ×6 IMPLANT
OIL SILICONE PENTAX (PARTS (SERVICE/REPAIRS)) ×3 IMPLANT
PACK CHEST (CUSTOM PROCEDURE TRAY) ×3 IMPLANT
PAD ARMBOARD 7.5X6 YLW CONV (MISCELLANEOUS) ×9 IMPLANT
PASSER SUT SWANSON 36MM LOOP (INSTRUMENTS) ×3 IMPLANT
PATTIES SURGICAL 3 X3 (GAUZE/BANDAGES/DRESSINGS) ×1
PATTIES SURGICAL 3X3 (GAUZE/BANDAGES/DRESSINGS) ×2 IMPLANT
POUCH ENDO CATCH II 15MM (MISCELLANEOUS) ×3 IMPLANT
RELOAD EGIA BLACK ROTIC 45MM (STAPLE) ×3 IMPLANT
RELOAD TRI 2.0 30 MED THCK SUL (STAPLE) ×15 IMPLANT
RELOAD TRI 2.0 30 VAS MED SUL (ENDOMECHANICALS) ×6 IMPLANT
SCISSORS LAP 5X35 DISP (ENDOMECHANICALS) IMPLANT
SEALANT SURG COSEAL 4ML (VASCULAR PRODUCTS) IMPLANT
SEALANT SURG COSEAL 8ML (VASCULAR PRODUCTS) IMPLANT
SEALER LIGASURE MARYLAND 30 (ELECTROSURGICAL) ×3 IMPLANT
SHEARS HARMONIC HDI 20CM (ELECTROSURGICAL) ×3 IMPLANT
SHEARS HARMONIC HDI 36CM (ELECTROSURGICAL) ×3 IMPLANT
SOL ANTI FOG 6CC (MISCELLANEOUS) ×2 IMPLANT
SOLUTION ANTI FOG 6CC (MISCELLANEOUS) ×1
SPONGE INTESTINAL PEANUT (DISPOSABLE) ×12 IMPLANT
SPONGE TONSIL TAPE 1 RFD (DISPOSABLE) ×3 IMPLANT
STAPLER ECHELON POWERED (MISCELLANEOUS) IMPLANT
STAPLER ENDO GIA 12MM SHORT (STAPLE) ×3 IMPLANT
STOPCOCK 4 WAY LG BORE MALE ST (IV SETS) ×6 IMPLANT
SUT PROLENE 3 0 SH DA (SUTURE) ×6 IMPLANT
SUT PROLENE 4 0 RB 1 (SUTURE) ×2
SUT PROLENE 4-0 RB1 .5 CRCL 36 (SUTURE) ×4 IMPLANT
SUT SILK  1 MH (SUTURE) ×5
SUT SILK 1 MH (SUTURE) ×10 IMPLANT
SUT SILK 1 TIES 10X30 (SUTURE) IMPLANT
SUT SILK 2 0 SH (SUTURE) IMPLANT
SUT SILK 2 0SH CR/8 30 (SUTURE) IMPLANT
SUT STEEL 1 (SUTURE) IMPLANT
SUT VIC AB 0 CTX 18 (SUTURE) ×3 IMPLANT
SUT VIC AB 1 CTX 18 (SUTURE) ×6 IMPLANT
SUT VIC AB 1 CTX 36 (SUTURE) ×1
SUT VIC AB 1 CTX36XBRD ANBCTR (SUTURE) ×2 IMPLANT
SUT VIC AB 2-0 CTX 36 (SUTURE) ×6 IMPLANT
SUT VIC AB 3-0 SH 8-18 (SUTURE) IMPLANT
SUT VIC AB 3-0 X1 27 (SUTURE) ×6 IMPLANT
SUT VICRYL 0 UR6 27IN ABS (SUTURE) IMPLANT
SUT VICRYL 2 TP 1 (SUTURE) ×3 IMPLANT
SYR 10ML LL (SYRINGE) ×3 IMPLANT
SYR 20ML ECCENTRIC (SYRINGE) ×3 IMPLANT
SYR 20ML LL LF (SYRINGE) ×3 IMPLANT
SYR 50ML LL SCALE MARK (SYRINGE) ×6 IMPLANT
SYR 5ML LL (SYRINGE) ×6 IMPLANT
SYSTEM SAHARA CHEST DRAIN ATS (WOUND CARE) ×3 IMPLANT
TAPE UMBILICAL COTTON 1/8X30 (MISCELLANEOUS) ×3 IMPLANT
TOWEL GREEN STERILE (TOWEL DISPOSABLE) ×3 IMPLANT
TOWEL GREEN STERILE FF (TOWEL DISPOSABLE) ×3 IMPLANT
TRAP SPECIMEN MUCOUS 40CC (MISCELLANEOUS) IMPLANT
TRAY FOLEY MTR SLVR 16FR STAT (SET/KITS/TRAYS/PACK) ×3 IMPLANT
TROCAR BLADELESS 12MM (ENDOMECHANICALS) ×3 IMPLANT
TROCAR XCEL 12X100 BLDLESS (ENDOMECHANICALS) ×3 IMPLANT
TROCAR XCEL NON-BLD 11X100MML (ENDOMECHANICALS) ×3 IMPLANT
TROCAR XCEL NON-BLD 5MMX100MML (ENDOMECHANICALS) ×3 IMPLANT
TUBE CONNECTING 20X1/4 (TUBING) ×3 IMPLANT
TUBING EXTENTION W/L.L. (IV SETS) ×3 IMPLANT
TUBING LAP HI FLOW INSUFFLATIO (TUBING) ×6 IMPLANT
VALVE BIOPSY  SINGLE USE (MISCELLANEOUS) ×1
VALVE BIOPSY SINGLE USE (MISCELLANEOUS) ×2 IMPLANT
VALVE SUCTION BRONCHIO DISP (MISCELLANEOUS) ×3 IMPLANT
WATER STERILE IRR 1000ML POUR (IV SOLUTION) ×3 IMPLANT

## 2019-02-03 NOTE — Anesthesia Procedure Notes (Signed)
Procedure Name: Intubation Date/Time: 02/03/2019 7:58 AM Performed by: Trinna Post., CRNA Pre-anesthesia Checklist: Patient identified, Emergency Drugs available, Suction available, Patient being monitored and Timeout performed Patient Re-evaluated:Patient Re-evaluated prior to induction Oxygen Delivery Method: Circle system utilized Preoxygenation: Pre-oxygenation with 100% oxygen Induction Type: IV induction Ventilation: Two handed mask ventilation required, Mask ventilation without difficulty and Oral airway inserted - appropriate to patient size Laryngoscope Size: Mac and 4 Grade View: Grade I Tube type: Oral Tube size: 8.5 mm Number of attempts: 1 Airway Equipment and Method: Stylet Placement Confirmation: ETT inserted through vocal cords under direct vision,  positive ETCO2 and breath sounds checked- equal and bilateral Secured at: 22 cm Tube secured with: Tape Dental Injury: Teeth and Oropharynx as per pre-operative assessment

## 2019-02-03 NOTE — H&P (Addendum)
[address]       [place] [address]             [phone]                    [name] [name] [facility] Record #[id number] Date of Birth: 1955-05-09  Referring: Nestor Lewandowsky, MD Primary Care: Hubbard Hartshorn, FNP Primary Cardiologist: No primary care provider on file.  Chief Complaint:    Chief Complaint  Patient presents with  . Lung Mass       History of Present Illness:    [name] [name] 64 y.o. male has  seen in the officefor  evaluation of left hilar mass.  The patient presented in April 2015 with a rectal carcinoma, PET scan at that time suggested a large mass at the anal verge extending into the perineal fat with nodal involvement of what right inguinal lymph node and mesorectal lymph nodes.  PET scan in 2015 showed no abnormalities in the chest per report.  He was treated with preoperative radiation and chemotherapy, underwent robot-assisted abdominal perineal resection.  Final pathology showed 1.5 cm tumor, 3 lymph examined all negative, pathologic staging  pT2pN0 . Patient CEA has gone from 3.0 in October 2018 to 5.9 in September 2020. Because of complaint of cough in March 2020 and rising CEA, CT of the chest abdomen and pelvis was performed and subsequent PET scan June 2020.  CT and PET scan showed a large left hilar mass.  Bronchoscopy with biopsy had positive cytology consistent with adenocarcinoma of the colon.  Pathologic biopsy was negative for tumor, but did show:"SEPTATE AND BRANCHING FUNGAL HYPHAE, CONSISTENT WITH FUNGUS BALL".  Subsequent KOH preps and cultures have been negative for fungus.  the patient subsequently had a Port-A-Cath placed and underwent chemotherapy treatment for stage IV colon cancer.   Follow-up PET scan shows persistent left hilar mass with little change in size.  7.6 x 6.6 cm in size with attenuated metabolic activity along the margins SUV 3.1 previously 5.0.   Patient was  referred to me y for consideration of  pneumonectomy with dx of "fungus ball"   Patient is a lifelong cigarette non-smoker, he notes occasional cigar and chews tobacco.  He denies any known cardiac history.  He does note some exertional shortness of breath but is able to carry on usual daily activities without symptoms.   Since last seen the patient was seen by infectious disease and started on treatment with antifungal agent.   I had the cytology from [place] reviewed-originally reported as positive cytology consistent with adenocarcinoma the colon, subsequent review by pathologist agreed with a malignant pathology but suggested it may be lung primary not colon.   Pulmonary function studies were done last week FEV1 2.9 predicted 4.17 69% DLCO 21.57 predicted 31.4  68% predicted   Cancer Staging Rectal cancer [facility]) Staging form: Colon and Rectum, AJCC 7th Edition - Clinical stage from 08/05/2014: Stage IVA (T2, N0, M1a) - Signed by Lloyd Huger, MD on 07/06/2018  Repeat Bronchoscopy and biopsy: 12/2018 SURGICAL PATHOLOGY   THIS IS AN ADDENDUM REPORT CASE: [id number]  PATIENT: [name] [name]  Surgical Pathology Report  Addendum   Reason for Addendum #1: Additional special stains  Clinical History: left hilar mass (cm)   FINAL MICROSCOPIC DIAGNOSIS:   A. LUNG, LEFT LINGULAR BRONCHUS, BIOPSY:  - Focal adenocarcinoma with extracellular mucin, see comment.   B. LUNG, LEFT APICAL BRONCHUS, BIOPSY:  -  Focal adenocarcinoma with extracellular mucin, see comment.   COMMENT:   A. and B. There is a tiny focus of adenocarcinoma associated with large  amounts of extracellular mucin. Immunohistochemistry is positive for  cytokeratin 7 (strong), cytokeratin 20, and CDX-2. The cells are  negative for p40, TTF-1 and cytokeratin 5/6. The history of rectal mass  and immunoprofile would be most consistent with a gastrointestinal  primary. PAS and GMS will be ordered and reported in an addendum due to  the history of  possible Aspergillus on prior samples. Dr. Melina Copa has  reviewed the case. Dr. Servando Snare was paged on 01/02/2019.    GROSS DESCRIPTION:   A. Received in saline are several minute fragments of tan-pink soft  tissue, 1 x 0.5 x 0.1 cm aggregate, submitted in 1 cassette.   B. Received in saline are several minute fragments of tan-pink soft  tissue, 0.8 x 0.5 x 0.1 cm aggregate, submitted in block B1. (AK  12/30/2018)     Final Diagnosis performed by Vicente Males, MD.  Electronically signed  01/02/2019  Technical component performed at [facility]. [facility], [place] [address] [place], [place] [address].  Professional component performed at [facility],  [place] [address] [place], [place] [address].    ADDENDUM:   A. and B. PAS and GMS are negative for fungal organisms.                                              Current Activity/ Functional Status:  Patient is independent with mobility/ambulation, transfers, ADL's, IADL's.   Zubrod Score: At the time of surgery this patient's most appropriate activity status/level should be described as: _0     0    Normal activity, no symptoms _1     1    Restricted in physical strenuous activity but ambulatory, able to do out light work _2     2    Ambulatory and capable of self care, unable to do work activities, up and about               >50 % of waking hours                              _3     3    Only limited self care, in bed greater than 50% of waking hours _4     4    Completely disabled, no self care, confined to bed or chair _5     5    Moribund   Past Medical History:  Diagnosis Date  . Anemia   . Cancer [facility])    rectal 2015, followed by Oncology, permanent colostomy  . Colostomy in place [facility])   . Dyspnea    on exertion  . History of kidney stones   . Pneumonia   . Seasonal allergies     Past Surgical History:  Procedure Laterality Date  . BACK SURGERY     . CHOLECYSTECTOMY    . COLON SURGERY    . ENDOBRONCHIAL ULTRASOUND N/A 07/01/2018   Procedure: ENDOBRONCHIAL ULTRASOUND;  Surgeon: Tyler Pita, MD;  Location: [facility] ORS;  Service: Cardiopulmonary;  Laterality: N/A;  . FLEXIBLE BRONCHOSCOPY Left 11/20/2018   Procedure: FLEXIBLE BRONCHOSCOPY;  Surgeon: Tyler Pita, MD;  Location: [facility] ORS;  Service: Cardiopulmonary;  Laterality: Left;  .  KNEE SURGERY Bilateral   . PORT A CATH REVISION Left 08/05/2018   Procedure: PORT A CATH REVISION, REPOSITION LEFT;  Surgeon: Robert Bellow, MD;  Location: [facility] ORS;  Service: General;  Laterality: Left;  . PORT-A-CATH REMOVAL Right 12/02/2014   Procedure: REMOVAL PORT-A-CATH;  Surgeon: Marlyce Huge, MD;  Location: [facility] ORS;  Service: General;  Laterality: Right;  . PORTACATH PLACEMENT    . PORTACATH PLACEMENT Left 07/29/2018   Procedure: INSERTION PORT-A-CATH LEFT;  Surgeon: Robert Bellow, MD;  Location: [facility] ORS;  Service: General;  Laterality: Left;  [name] VIDEO BRONCHOSCOPY WITH ENDOBRONCHIAL ULTRASOUND N/A 12/30/2018   Procedure: VIDEO BRONCHOSCOPY WITH ENDOBRONCHIAL ULTRASOUND;  Surgeon: Grace Isaac, MD;  Location[facility] OR;  Service: Thoracic;  Laterality: N/A;    Family History  Problem Relation Age of Onset  . Cancer Mother   . Hypertension Mother   . COPD Mother   . COPD Father   . Cancer Father        Prostate CA, on Lupron  . Stroke Maternal Grandmother   . Cancer Paternal Grandmother   . Heart attack Paternal Grandfather   . Cancer Paternal Grandfather      Social History   Tobacco Use  Smoking Status Never Smoker  Smokeless Tobacco Current User  . Types: Chew  Tobacco Comment   might smoke a cigar once every 3 months    Social History   Substance and Sexual Activity  Alcohol Use Yes  . Alcohol/week: 0.0 standard drinks   Comment: RARE     Allergies  Allergen Reactions  . Sulfa Antibiotics Hives    Current Facility-Administered Medications   Medication Dose Route Frequency Provider Last Rate Last Admin  . ceFAZolin (ANCEF) 3 g in dextrose 5 % 50 mL IVPB  3 g Intravenous 30 min Pre-Op Grace Isaac, MD       Facility-Administered Medications Ordered in Other Encounters  Medication Dose Route Frequency Provider Last Rate Last Admin  . fentaNYL (SUBLIMAZE) injection   Intravenous Anesthesia Intra-op Trinna Post., CRNA   50 mcg at 02/03/19 0714  . midazolam (VERSED) 5 MG/5ML injection   Intravenous Anesthesia Intra-op Trinna Post., CRNA   2 mg at 02/03/19 3557    Pertinent items are noted in HPI.   Review of Systems:     Cardiac Review of Systems: [Y] = yes  or   [ N ] = no   Chest Pain [n   ]  Resting SOB [ n ] Exertional SOB  [ y ]  Vertell Limber Florencio.Farrier ]   Pedal Edema [ n ]    Palpitations [n  ] Syncope  Florencio.Farrier ]   Presyncope [ n ]   General Review of Systems: [Y] = yes [  ]=no Constitional: recent weight change [  ];  Wt loss over the last 3 months [   ] anorexia [  ]; fatigue [  ]; nausea [  ]; night sweats [  ]; fever [  ]; or chills [  ];           Eye : blurred vision [  ]; diplopia [   ]; vision changes [  ];  Amaurosis fugax[  ]; Resp: cough [  ];  wheezing[  ];  hemoptysis[  ]; shortness of breath[  ]; paroxysmal nocturnal dyspnea[  ]; dyspnea on exertion[  ]; or orthopnea[  ];  GI:  gallstones[  ], vomiting[  ];  dysphagia[  ]; melena[  ];  hematochezia [  ]; heartburn[  ];   Hx of  Colonoscopy[  ]; GU: kidney stones [  ]; hematuria[  ];   dysuria [  ];  nocturia[  ];  history of     obstruction [  ]; urinary frequency [  ]             Skin: rash, swelling[  ];, hair loss[  ];  peripheral edema[  ];  or itching[  ]; Musculosketetal: myalgias[  ];  joint swelling[  ];  joint erythema[  ];  joint pain[  ];  back pain[  ];  Heme/Lymph: bruising[  ];  bleeding[  ];  anemia[  ];  Neuro: TIA[  ];  headaches[  ];  stroke[  ];  vertigo[  ];  seizures[  ];   paresthesias[  ];  difficulty walking[  ];  Psych:depression[  ];  anxiety[  ];  Endocrine: diabetes[  ];  thyroid dysfunction[  ];  Immunizations: Flu up to date [  ]; Pneumococcal up to date [  ];  Other:     PHYSICAL EXAMINATION: BP (!) 177/92   Pulse 62   Temp 98.1 F (36.7 C) (Oral)   Resp 18   Ht _0  (1.905 m)   Wt 122.6 kg   SpO2 99%   BMI 33.78 kg/m     General appearance: alert, cooperative and no distress Head: Normocephalic, without obvious abnormality, atraumatic Neck: no adenopathy, no carotid bruit, no JVD, supple, symmetrical, trachea midline and thyroid not enlarged, symmetric, no tenderness/mass/nodules Lymph nodes: Cervical, supraclavicular, and axillary nodes normal. Resp: clear to auscultation bilaterally Cardio: regular rate and rhythm, S1, S2 normal, no murmur, click, rub or gallop GI: soft, non-tender; bowel sounds normal; no masses,  no organomegaly Extremities: extremities normal, atraumatic, no cyanosis or edema Neurologic: Grossly normal  colostomy intact left lower abdomen   Diagnostic Studies & Laboratory data:     Recent Radiology Findings:  DG Chest 2 View  Result Date: 02/03/2019 CLINICAL DATA:  Preop EXAM: CHEST - 2 VIEW COMPARISON:  December 30, 2018 FINDINGS: The heart size and mediastinal contours are within normal limits. A left-sided MediPort catheter seen with the tip at the superior cavoatrial junction. Again noted is a 10.5 cm left perihilar mass. The right lung is clear. No acute osseous abnormality. IMPRESSION: Unchanged large left perihilar mass. Electronically Signed   By: Prudencio Pair M.D.   On: 02/03/2019 06:31    CT HEAD W & WO CONTRAST  Result Date: 01/27/2019 CLINICAL DATA:  Metastatic colon cancer EXAM: CT HEAD WITHOUT AND WITH CONTRAST TECHNIQUE: Contiguous axial images were obtained from the base of the skull through the vertex without and with intravenous contrast CONTRAST:  83m OMNIPAQUE IOHEXOL 300 MG/ML  SOLN COMPARISON:  None. FINDINGS: Brain: There is no acute intracranial  hemorrhage, mass, mass-effect, or edema. There is no abnormal enhancement. Gray-white differentiation is preserved. There is no extra-axial fluid collection. Ventricles and sulci are within normal limits in size and configuration. Vascular: Unremarkable. Skull: Calvarium is unremarkable. Sinuses/Orbits: No acute finding. Other: None. IMPRESSION: No evidence of intracranial metastatic disease. Electronically Signed   By: PMacy MisM.D.   On: 01/27/2019 09:42   Patient was unable to tolerate MRI of brain but did tolerated ct of head today     Nm Pet Image Restag (ps) Skull Base To Thigh Result Date: 11/04/2018 CLINICAL DATA:  Subsequent treatment strategy for rectal cancer. Previous hypermetabolic left upper lobe mass was  biopsied with pathology revealing fungus ball. EXAM: NUCLEAR MEDICINE PET SKULL BASE TO THIGH TECHNIQUE: 13.3 mCi F-18 FDG was injected intravenously. Full-ring PET imaging was performed from the skull base to thigh after the radiotracer. CT data was obtained and used for attenuation correction and anatomic localization. Fasting blood glucose: 111 mg/dl COMPARISON:  06/24/2018 FINDINGS: Mediastinal blood pool activity: SUV max 2.7 Liver activity: SUV max NA NECK: No significant abnormal hypermetabolic activity in this region. Incidental CT findings: none CHEST: 7.6 by 6.6 cm left upper lobe lesion has faintly accentuated metabolic activity along its margins, maximum SUV 3.1, previously 5.0. The amount of marginal metabolic activity is significantly reduced. Upon biopsy there is no evidence of malignancy but there was fungal material. Incidental CT findings: Left Port-A-Cath tip: SVC. Mild atherosclerotic calcification of the aortic arch, left anterior descending, right, and circumflex coronary arteries. ABDOMEN/PELVIS: Accentuated activity in the ascending colon without definite CT correlate, maximum SUV 8.5. This is most likely to be physiologic activity. Small amount of activity along  the expected location of the penile urethra, likely excreted urinary FDG. Incidental CT findings: Cholecystectomy. Descending and sigmoid colon diverticulosis. Sigmoid colostomy. Stable postoperative/post therapy presacral findings. Aortoiliac atherosclerotic vascular disease. Small nonobstructive bilateral renal calculi are suspected. SKELETON: No significant abnormal hypermetabolic activity in this region. Incidental CT findings: none IMPRESSION: 1. No findings of active or recurrent malignancy. 2. 7.6 by 6.6 cm left upper lobe lesion with reduced metabolic activity along its margins compared to prior, compatible with known fungus ball. 3. Postoperative/post therapy related presacral findings without compelling evidence of recurrence. 4. Mildly accentuated activity in the ascending colon is without CT correlate and most likely physiologic. 5. Other imaging findings of potential clinical significance: Aortic Atherosclerosis (ICD10-I70.0). Coronary atherosclerosis. Sigmoid colostomy. Descending and sigmoid colon diverticulosis. Bilateral tiny nonobstructive renal calculi. Electronically Signed   By: Van Clines M.D.   On: 11/04/2018 12:58    Dg Chest Port 1 View  Result Date: 11/20/2018 CLINICAL DATA:  Post left-sided flexible bronchoscopy. EXAM: PORTABLE CHEST 1 VIEW COMPARISON:  Chest x-ray 07/29/2018 and PET-CT 11/04/2018 FINDINGS: Left subclavian Port-A-Cath with tip demonstrating interval advancement as tip is now just below the cavoatrial junction. Lungs are somewhat hypoinflated demonstrate no change in known biopsy-proven 9.7 cm fungus ball over the lingula. No evidence of effusion. Subtle prominence of the infrahilar vessels likely mild vascular congestion. Cardiomediastinal silhouette and remainder of the exam is unchanged. IMPRESSION: Suggestion mild vascular congestion. Stable known biopsy-proven 9.7 cm fungus ball over the lingula. Electronically Signed   By: Marin Olp M.D.   On:  11/20/2018 15:43   Dg C-arm 1-60 Min-no Report  Result Date: 11/20/2018 Fluoroscopy was utilized by the requesting physician.  No radiographic interpretation.     I have independently reviewed the above radiology studies  and reviewed the findings with the patient.    PATH: Cytology - Non PAP CASE: [id number] PATIENT: [name] [name] Non-Gyn Cytology Report  SPECIMEN SUBMITTED: A. Lung, left upper lobe; brushing  CLINICAL HISTORY: 64 year old male with rectal adenocarcinoma treated with chemoradiation and surgery in 2015 who presents with rising CEA and new LUL mass  PRE-OPERATIVE DIAGNOSIS: Metastatic colorectal carcinoma  POST-OPERATIVE DIAGNOSIS: Same as above   DIAGNOSIS: A.  LUNG, LEFT UPPER LOBE; BRONCHOSCOPY WITH BRUSHING: - POSITIVE FOR MALIGNANCY. - ADENOCARCINOMA CONSISTENT WITH METASTATIC COLORECTAL CARCINOMA IS PRESENT.  See concurrent cases [id number], [id number], and [id number].  There is insufficient material for ancillary molecular testing.  Comment: Sections demonstrate malignant epithelial groups  floating within pools of mucin.  Limited panel of immunohistochemical stains was performed with the following pattern of results: Cytokeratin 7: Positive Cytokeratin 20: Positive TTF-1: Negative CDX 2: Posit ive This pattern of immunoreactivity supports the above diagnosis. These findings were communicated to Drs. Odetta Pink on 07/04/2018 via [facility] secure text.    GROSS DESCRIPTION: A. Site: Lung, left upper lobe Procedure: Bronchoscopy Cytotechnologist: Ashlee Howze-Soremekun and Marybeth Anderson Specimen(s) collected: 4 Diff Quik stained slides Spec imen labeled: Left upper lobe, brush                      Volume: 15 mL                      Description: Clear CytoLyt solution admixed with wispy tissue fragments and 1 collection brush                      Submitted for: ThinPrep and cell block   Final Diagnosis performed by Quay Burow, MD.   Electronically signed 07/04/2018 2:34:08PM    Surgical Pathology  CASE: [id number]  PATIENT: [name] [name]  Surgical Pathology Report    SPECIMEN SUBMITTED:  A. Lung, left upper lobe   CLINICAL HISTORY:  64 year old male with rectal adenocarcinoma treated with chemoradiation  and surgery in 2015 who presents with rising CEA and new LUL mass   PRE-OPERATIVE DIAGNOSIS:  Metastatic colorectal carcinoma   POST-OPERATIVE DIAGNOSIS:  Same as above    DIAGNOSIS:  A. LUNG, LEFT UPPER LOBE; BRONCHOSCOPY WITH BIOPSY:  - SEPTATE AND BRANCHING FUNGAL HYPHAE, CONSISTENT WITH FUNGUS BALL.  - FRAGMENTS OF BENIGN RESPIRATORY MUCOSA.  - NEGATIVE FOR MALIGNANCY.   See concurrent cases [id number], -[id number], and -281.   Comment:  Preliminary findings were communicated to Drs. Patsey Berthold and Grayland Ormond via  [facility] secure text on 07/02/2018 at 9:42 AM. GMS stain was performed and  fails to identify invasive disease. A CDX-2 stain was performed and is  negative.   GROSS DESCRIPTION:  A. Labeled: Left upper lobe, biopsy  Received: The specimen is retrieved via bronchoscopy and placed into  formalin.  Tissue fragment(s): Multiple  Size: Aggregate, 1.0 x 0.5 x 0.1 cm  Description: Received are irregular fragments of tan-pink soft tissue  admixed with minimal, hemorrhagic material. At the time of procedure, 2  Diff-Quik stained slides are performed. The remainder of the specimen  is filtered into a mesh bag and submitted for routine histology in  cassette 1.    Final Diagnosis performed by Quay Burow, MD.  Electronically signed  07/04/2018 12:34:35PM  The electronic signature indicates that the named Attending Pathologist  has evaluated the specimen  Technical component performed at [facility], [address], [place],  [place] [address] Lab: [phone] Dir: Rush Farmer, MD, MMM Professional  component performed at [facility], [facility], [place],  [place], [place] [address] Lab: [phone] Dir: Dellia Nims.  Reuel Derby, MD    Recent Lab Findings: Lab Results  Component Value Date   WBC 4.0 01/30/2019   HGB 13.5 01/30/2019   HCT 42.3 01/30/2019   PLT 281 01/30/2019   GLUCOSE 105 (H) 01/30/2019   CHOL 147 03/22/2018   TRIG 126 03/22/2018   HDL 35 (L) 03/22/2018   LDLCALC 89 03/22/2018   ALT 19 01/30/2019   AST 23 01/30/2019   NA 141 01/30/2019   K 4.5 01/30/2019   CL 110 01/30/2019   CREATININE 0.85  01/30/2019   BUN 20 01/30/2019   CO2 21 (L) 01/30/2019   TSH 3.96 07/03/2016   INR 0.9 01/30/2019      Assessment / Plan:   I have reviewed with the patient again the pathology results, cytology results, and PFTs.   Follow-up bronchoscopy repeat biopsies.  CT and PET scan findings indicate the most likely diagnosis is isolated metastasis of GI malignancy to the left upper lobe, predominantly mucus producing tumor which is causing unusual findings left upper lobe mass.  With the repeat bronchoscopy it appears that there is sufficient left upper lobe bronchus to consider left upper lobectomy, but discussed with the patient that with no other areas of disease would be reasonable to consider left upper lobectomy for treatment of his advanced age rectal cancer.  I also discussed with him that a pneumonectomy would probably carry to greater risk for what would benefit from a resection.  The patient and his wife had their questions answered, reviewed the CT and bronchoscopy findings.  Patient is willing to proceed with left video-assisted thoracoscopy with lung resection, with the goal left upper lobectomy and node dissection.  The patient and his wife understand that this may not be curative, but it is the only known active disease/malignancy that the patient currently has.    Following surgical path results we we will review with Dr. Grayland Ormond, further chemo administration versus close surveillance.  The goals risks and alternatives of the planned  surgical procedure Procedure(s): VIDEO BRONCHOSCOPY (N/A) VIDEO ASSISTED THORACOSCOPY (VATS)/LUNG RESECTION (Left)  have been discussed with the patient in detail. The risks of the procedure including death, infection, stroke, myocardial infarction, bleeding, blood transfusion have all been discussed specifically.  I have quoted [name] [name] a 2 % of perioperative mortality and a complication rate as high as 30 %. The patient's questions have been answered.[name] [name] is willing  to proceed with the planned procedure.   Grace Isaac MD      [place] [place] [address] Office [phone]     02/03/2019 7:17 AM

## 2019-02-03 NOTE — Anesthesia Procedure Notes (Signed)
Central Venous Catheter Insertion Performed by: Murvin Natal, MD, anesthesiologist Start/End1/11/2019 7:15 AM, 02/03/2019 7:30 AM Patient location: Pre-op. Preanesthetic checklist: patient identified, IV checked, site marked, risks and benefits discussed, surgical consent, monitors and equipment checked, pre-op evaluation, timeout performed and anesthesia consent Lidocaine 1% used for infiltration and patient sedated Hand hygiene performed , maximum sterile barriers used  and Seldinger technique used Catheter size: 8 Fr Total catheter length 16. Central line was placed.Double lumen Procedure performed using ultrasound guided technique. Ultrasound Notes:anatomy identified, needle tip was noted to be adjacent to the nerve/plexus identified, no ultrasound evidence of intravascular and/or intraneural injection and image(s) printed for medical record Attempts: 1 Following insertion, dressing applied, line sutured and Biopatch. Post procedure assessment: blood return through all ports, free fluid flow and no air  Patient tolerated the procedure well with no immediate complications.

## 2019-02-03 NOTE — Progress Notes (Signed)
Transferred-in from [facility]  By bed awake and alert. Chest tube to left chest intact connected to -20 cm suction ,  Mod. Air leak, with bright red output at 30 ml level. Denied any discomfort. Continue to monitor.

## 2019-02-03 NOTE — Anesthesia Procedure Notes (Signed)
Procedure Name: Intubation Date/Time: 02/03/2019 8:11 AM Performed by: Trinna Post., CRNA Pre-anesthesia Checklist: Patient identified, Emergency Drugs available, Suction available, Patient being monitored and Timeout performed Patient Re-evaluated:Patient Re-evaluated prior to induction Oxygen Delivery Method: Circle system utilized Preoxygenation: Pre-oxygenation with 100% oxygen Laryngoscope Size: Mac and 4 Grade View: Grade I Tube type: Oral Endobronchial tube: Right, Double lumen EBT and EBT position confirmed by fiberoptic bronchoscope and 41 Fr Number of attempts: 1 Airway Equipment and Method: Bougie stylet Placement Confirmation: ETT inserted through vocal cords under direct vision,  positive ETCO2 and breath sounds checked- equal and bilateral Tube secured with: Tape Dental Injury: Teeth and Oropharynx as per pre-operative assessment  Comments: 8.5 ETT exchanged via bougie catheter to 41 Fr Right double lumen tube, MAC 4 blade used to maintain view of cords throughout exchange. All VSS and no adverse events. Position confirmed by Dr Roanna Banning using endoscope

## 2019-02-03 NOTE — Anesthesia Procedure Notes (Signed)
Arterial Line Insertion Start/End1/11/2019 7:00 AM Performed by: Trinna Post., CRNA, CRNA  Patient location: Pre-op. Preanesthetic checklist: patient identified, IV checked, site marked, risks and benefits discussed, surgical consent, monitors and equipment checked, pre-op evaluation, timeout performed and anesthesia consent Lidocaine 1% used for infiltration Right, radial was placed Catheter size: 20 G Hand hygiene performed  and maximum sterile barriers used   Attempts: 1 Procedure performed without using ultrasound guided technique. Following insertion, dressing applied and Biopatch. Post procedure assessment: normal  Patient tolerated the procedure well with no immediate complications.

## 2019-02-03 NOTE — Anesthesia Postprocedure Evaluation (Signed)
Anesthesia Post Note  Patient: [name]  Procedure(s) Performed: VIDEO BRONCHOSCOPY (N/A ) VIDEO ASSISTED THORACOSCOPY (VATS)/LUNG RESECTION (Left Chest) MINITHORACOTOMY (Left Chest) LEFT UPPER LOBECTOMY WITH LYMPH NODE DISSECTION (Left Chest) INTERCOSTAL NERVE BLOCK WITH EXPAREL (Left Chest)     Patient location during evaluation: PACU Anesthesia Type: General Level of consciousness: awake and alert Pain management: pain level controlled Vital Signs Assessment: post-procedure vital signs reviewed and stable Respiratory status: spontaneous breathing, nonlabored ventilation and respiratory function stable Cardiovascular status: blood pressure returned to baseline and stable Postop Assessment: no apparent nausea or vomiting Anesthetic complications: no    Last Vitals:  Vitals:   02/03/19 1700 02/03/19 1715  BP: 123/70 119/75  Pulse: 99 99  Resp: 18 19  Temp:    SpO2: 91% 97%    Last Pain:  Vitals:   02/03/19 1715  TempSrc:   PainSc: [name]

## 2019-02-03 NOTE — Brief Op Note (Addendum)
      [address]       [place] [address]             [phone]    02/03/2019  5:03 PM  PATIENT:  [name] [name]  64 y.o. male  PRE-OPERATIVE DIAGNOSIS:  lung mass left upper lobe   POST-OPERATIVE DIAGNOSIS:  Lung Mass left upper lobe   PROCEDURE:  Procedure(s): VIDEO BRONCHOSCOPY (N/A) VIDEO ASSISTED THORACOSCOPY (VATS)/LUNG RESECTION (Left) MINITHORACOTOMY (Left) LEFT UPPER LOBECTOMY WITH LYMPH NODE DISSECTION (Left) INTERCOSTAL NERVE BLOCK WITH EXPAREL (Left)  SURGEON:  Surgeon(s) and Role:    * Grace Isaac, MD - Primary  PHYSICIAN ASSISTANT: WAYNE GOLD PA-C   ANESTHESIA:   general  EBL:  350 mL   BLOOD ADMINISTERED:none  DRAINS: 2 28 F Chest Tube(s) in the LEFT HEMITHORAX   SPECIMEN:  Source of Specimen:  LEFT UPPER LOBECTOMY, MULT LN'S  DISPOSITION OF SPECIMEN:  PATHOLOGY and micro  COUNTS:  YES  DICTATION: .Other Dictation: Dictation Number [id number]  PLAN OF CARE: Admit to inpatient   PATIENT DISPOSITION: TO  PACU , STABLE   Delay start of Pharmacological VTE agent (>24hrs) due to surgical blood loss or risk of bleeding: yes  COMPLICATIONS: NO KNOWN

## 2019-02-03 NOTE — Transfer of Care (Signed)
Immediate Anesthesia Transfer of Care Note  Patient: [name] [name]  Procedure(s) Performed: VIDEO BRONCHOSCOPY (N/A ) VIDEO ASSISTED THORACOSCOPY (VATS)/LUNG RESECTION (Left Chest)  Patient Location: PACU  Anesthesia Type:General  Level of Consciousness: awake, alert  and oriented  Airway & Oxygen Therapy: Patient Spontanous Breathing and Patient connected to face mask oxygen  Post-op Assessment: Report given to RN and Post -op Vital signs reviewed and stable  Post vital signs: Reviewed and stable  Last Vitals:  Vitals Value Taken Time  BP 134/85 02/03/19 1616  Temp    Pulse 96 02/03/19 1619  Resp 21 02/03/19 1619  SpO2 100 % 02/03/19 1619  Vitals shown include unvalidated device data.  Last Pain:  Vitals:   02/03/19 0618  TempSrc: Oral  PainSc: 0-No pain         Complications: No apparent anesthesia complications

## 2019-02-04 ENCOUNTER — Inpatient Hospital Stay (HOSPITAL_COMMUNITY): Payer: 59

## 2019-02-04 LAB — POCT I-STAT 7, (LYTES, BLD GAS, ICA,H+H)
Acid-base deficit: 1 mmol/L (ref 0.0–2.0)
Bicarbonate: 27.3 mmol/L (ref 20.0–28.0)
Bicarbonate: 26.6 mmol/L (ref 20.0–28.0)
Calcium, Ion: 1.2 mmol/L (ref 1.15–1.40)
Calcium, Ion: 1.29 mmol/L (ref 1.15–1.40)
HCT: 36 % — ABNORMAL LOW (ref 39.0–52.0)
HCT: 35 % — ABNORMAL LOW (ref 39.0–52.0)
Hemoglobin: 12.2 g/dL — ABNORMAL LOW (ref 13.0–17.0)
Hemoglobin: 11.9 g/dL — ABNORMAL LOW (ref 13.0–17.0)
O2 Saturation: 99 %
O2 Saturation: 100 %
Patient temperature: 37.6
Patient temperature: 37.6
Potassium: 5.7 mmol/L — ABNORMAL HIGH (ref 3.5–5.1)
Potassium: 5.6 mmol/L — ABNORMAL HIGH (ref 3.5–5.1)
Sodium: 135 mmol/L (ref 135–145)
Sodium: 134 mmol/L — ABNORMAL LOW (ref 135–145)
TCO2: 29 mmol/L (ref 22–32)
TCO2: 28 mmol/L (ref 22–32)
pCO2 arterial: 63 mmHg — ABNORMAL HIGH (ref 32.0–48.0)
pCO2 arterial: 52.8 mmHg — ABNORMAL HIGH (ref 32.0–48.0)
pH, Arterial: 7.248 — ABNORMAL LOW (ref 7.350–7.450)
pH, Arterial: 7.314 — ABNORMAL LOW (ref 7.350–7.450)
pO2, Arterial: 146 mmHg — ABNORMAL HIGH (ref 83.0–108.0)
pO2, Arterial: 416 mmHg — ABNORMAL HIGH (ref 83.0–108.0)

## 2019-02-04 LAB — BASIC METABOLIC PANEL
Anion gap: 9 (ref 5–15)
BUN: 14 mg/dL (ref 8–23)
CO2: 25 mmol/L (ref 22–32)
Calcium: 8.6 mg/dL — ABNORMAL LOW (ref 8.9–10.3)
Chloride: 103 mmol/L (ref 98–111)
Creatinine, Ser: 0.87 mg/dL (ref 0.61–1.24)
GFR calc Af Amer: >60 mL/min (ref >60)
GFR calc non Af Amer: >60 mL/min (ref >60)
Glucose, Bld: 123 mg/dL — ABNORMAL HIGH (ref 70–99)
Potassium: 4.3 mmol/L (ref 3.5–5.1)
Sodium: 137 mmol/L (ref 135–145)

## 2019-02-04 LAB — CBC
HCT: 35.3 % — ABNORMAL LOW (ref 39.0–52.0)
Hemoglobin: 11.2 g/dL — ABNORMAL LOW (ref 13.0–17.0)
MCH: 30.5 pg (ref 26.0–34.0)
MCHC: 31.7 g/dL (ref 30.0–36.0)
MCV: 96.2 fL (ref 80.0–100.0)
Platelets: 284 10*3/uL (ref 150–400)
RBC: 3.67 MIL/uL — ABNORMAL LOW (ref 4.22–5.81)
RDW: 13.6 % (ref 11.5–15.5)
WBC: 7 10*3/uL (ref 4.0–10.5)
nRBC: 0 % (ref 0.0–0.2)

## 2019-02-04 LAB — BLOOD GAS, ARTERIAL
Acid-Base Excess: 1 mmol/L (ref 0.0–2.0)
Bicarbonate: 25 mmol/L (ref 20.0–28.0)
FIO2: 21
O2 Saturation: 97.8 %
Patient temperature: 36.9
pCO2 arterial: 39.1 mmHg (ref 32.0–48.0)
pH, Arterial: 7.422 (ref 7.350–7.450)
pO2, Arterial: 97.9 mmHg (ref 83.0–108.0)

## 2019-02-04 LAB — ACID FAST SMEAR (AFB, MYCOBACTERIA): Acid Fast Smear: NEGATIVE

## 2019-02-04 MED ORDER — ENOXAPARIN SODIUM 40 MG/0.4ML ~~LOC~~ SOLN
40.0000 mg | SUBCUTANEOUS | Status: DC
  Administered 2019-02-04 – 2019-02-14 (×11): 40 mg via SUBCUTANEOUS
  Filled 2019-02-04 (×11): qty 0.4

## 2019-02-04 MED ORDER — ALBUTEROL SULFATE (2.5 MG/3ML) 0.083% IN NEBU
2.5000 mg | INHALATION_SOLUTION | Freq: Four times a day (QID) | RESPIRATORY_TRACT | Status: DC | PRN
  Administered 2019-02-06: 2.5 mg via RESPIRATORY_TRACT
  Filled 2019-02-04: qty 3

## 2019-02-04 MED ORDER — ALBUTEROL SULFATE (2.5 MG/3ML) 0.083% IN NEBU
2.5000 mg | INHALATION_SOLUTION | Freq: Three times a day (TID) | RESPIRATORY_TRACT | Status: DC
  Administered 2019-02-04 – 2019-02-06 (×5): 2.5 mg via RESPIRATORY_TRACT
  Filled 2019-02-04 (×5): qty 3

## 2019-02-04 MED ORDER — ALBUTEROL SULFATE (2.5 MG/3ML) 0.083% IN NEBU
2.5000 mg | INHALATION_SOLUTION | RESPIRATORY_TRACT | Status: DC
  Administered 2019-02-04: 2.5 mg via RESPIRATORY_TRACT
  Filled 2019-02-04: qty 3

## 2019-02-04 NOTE — Plan of Care (Signed)

## 2019-02-04 NOTE — Consult Note (Signed)
   [facility] CM Inpatient Consult   02/04/2019  [name] [name] 02/23/1955 [id number]   Patient is a recent and currently active with [facility] for chronic disease management services.  Patient has been engaged by a [facility].  Our community based plan of care has focused on disease management and community resource support in the [place].  Maudie Mercury [facility] RN Care Coordinator is aware of patient's admission.  Patient verbalized concerns for his need for ostomy care to be done by his wife or the ostomy nurse.  Call attempts to speak with his nurse April, who is currently not available and was transferred to attempted to speak with patient and no answer to hospital and he currently did not answer.  Plan: Will continue to follow up on patient and progress/needs requested. Follow up with  Inpatient Transition Of Care [TOC] team member to make aware that [facility] following.   Of note, [facility] Care Management services does not replace or interfere with any services that are needed or arranged by inpatient [facility] care management team.  For additional questions or referrals please contact:   Natividad Brood, RN BSN [facility] Liaison  [phone] business mobile phone Toll free office [phone]  Fax number: [phone] [email or web address]

## 2019-02-04 NOTE — Progress Notes (Addendum)
[address]       [place] [address]             [phone]      1 Day Post-Op Procedure(s) (LRB): VIDEO BRONCHOSCOPY (N/A) VIDEO ASSISTED THORACOSCOPY (VATS)/LUNG RESECTION (Left) MINITHORACOTOMY (Left) LEFT UPPER LOBECTOMY WITH LYMPH NODE DISSECTION (Left) INTERCOSTAL NERVE BLOCK WITH EXPAREL (Left) Subjective:  some intermit pain, PCA working pretty well  Objective: Vital signs in last 24 hours: Temp:  [98 F (36.7 C)-98.7 F (37.1 C)] 98.4 F (36.9 C) (01/12 0330) Pulse Rate:  [65-104] 86 (01/12 0430) Cardiac Rhythm: Normal sinus rhythm (01/12 0430) Resp:  [11-21] 18 (01/12 0430) BP: (108-134)/(65-85) 117/65 (01/12 0330) SpO2:  [91 %-100 %] 97 % (01/12 0430) Arterial Line BP: (88-137)/(39-76) 136/76 (01/12 0430)  Hemodynamic parameters for last 24 hours:    Intake/Output from previous day: 01/11 0701 - 01/12 0700 In: 4250.8 [P.O.:1077; I.V.:2723.8; IV Piggyback:450] Out: 3216 [Urine:2550; Blood:350; Chest Tube:316] Intake/Output this shift: No intake/output data recorded.  General appearance: alert, cooperative and no distress Heart: regular rate and rhythm Lungs: some ronchi/wheeze, improves with cough Abdomen: nontender or distended Extremities: no edema Wound: dressings CDI  Lab Results: Recent Labs    02/04/19 0225  WBC 7.0  HGB 11.2*  HCT 35.3*  PLT 284   BMET:  Recent Labs    02/04/19 0225  NA 137  K 4.3  CL 103  CO2 25  GLUCOSE 123*  BUN 14  CREATININE 0.87  CALCIUM 8.6*    PT/INR: No results for input(s): LABPROT, INR in the last 72 hours. ABG    Component Value Date/Time   PHART 7.422 02/04/2019 0345   HCO3 25.0 02/04/2019 0345   ACIDBASEDEF 0.6 01/30/2019 1055   O2SAT 97.8 02/04/2019 0345   CBG (last 3)  No results for input(s): GLUCAP in the last 72 hours.  Meds Scheduled Meds: . acetaminophen  1,000 mg Oral Q6H   Or  . acetaminophen (TYLENOL) oral liquid 160 mg/5 mL  1,000 mg Oral Q6H  .  albuterol  2.5 mg Nebulization Q4H WA  . bisacodyl  10 mg Oral Daily  . Chlorhexidine Gluconate Cloth  6 each Topical Daily  . fentaNYL   Intravenous Q4H  . loratadine  10 mg Oral Daily  . senna-docusate  1 tablet Oral QHS  . timolol  1 drop Right Eye QHS   Continuous Infusions: . sodium chloride    . dextrose 5 % and 0.9% NaCl 100 mL/hr at 02/04/19 0600   PRN Meds:.Place/Maintain arterial line **AND** sodium chloride, diphenhydrAMINE **OR** diphenhydrAMINE, naloxone **AND** sodium chloride flush, ondansetron (ZOFRAN) IV, ondansetron (ZOFRAN) IV, oxyCODONE, traMADol  Xrays DG Chest 2 View  Result Date: 02/03/2019 CLINICAL DATA:  Preop EXAM: CHEST - 2 VIEW COMPARISON:  December 30, 2018 FINDINGS: The heart size and mediastinal contours are within normal limits. A left-sided MediPort catheter seen with the tip at the superior cavoatrial junction. Again noted is a 10.5 cm left perihilar mass. The right lung is clear. No acute osseous abnormality. IMPRESSION: Unchanged large left perihilar mass. Electronically Signed   By: Prudencio Pair M.D.   On: 02/03/2019 06:31   DG Chest Port 1 View  Result Date: 02/03/2019 CLINICAL DATA:  64 year old male status post VATS. EXAM: PORTABLE CHEST 1 VIEW COMPARISON:  Chest radiograph dated 02/02/2018. FINDINGS: Interval resection of the previously seen left upper lobe mass. Two left-sided chest tube with tip over the left upper lobe/left suprahilar region noted.  Left pectoral Port-A-Cath with tip at the cavoatrial junction as well as right IJ central venous line with tip over the right atrium close to the cavoatrial junction. Minimal bibasilar atelectasis. No focal consolidation, large pleural effusion, or pneumothorax. The cardiac silhouette is within normal limits. No acute osseous pathology. IMPRESSION: 1. Status post resection of left upper lobe mass.  No pneumothorax. 2. Support apparatus as described. Electronically Signed   By: Anner Crete M.D.   On:  02/03/2019 17:36    Assessment/Plan: S/P Procedure(s) (LRB): VIDEO BRONCHOSCOPY (N/A) VIDEO ASSISTED THORACOSCOPY (VATS)/LUNG RESECTION (Left) MINITHORACOTOMY (Left) LEFT UPPER LOBECTOMY WITH LYMPH NODE DISSECTION (Left) INTERCOSTAL NERVE BLOCK WITH EXPAREL (Left)  1 overall doing well POD#1 2 hemodyn stable, short bursts of SVT but mostly sinus rhythm/sinus tachy. BP very stable, no fevers- will d/c a-line 3 sats good on RA. Cont pulm toilet/nebs- ABG is ok 4 + air leak, CXR without pntx/clear lung fields without signif ASD or effusions- keep CT's to suction. 425 cc drainage 5 mild acute blood loss anemia- monitor 6 no leukocytosis 7 normal renal fxn, good UOP, starting to eat , will reduce IVF 8 routine activity progression 9 cont PCA 10 lovenox for DVT ppx 11 initial path- adenocarcinoma with extracellular mucin  LOS: 1 day    [name] [facility] 02/04/2019 Pager [phone]  I have seen and examined [name] [name] and agree with the above assessment  and plan.  Grace Isaac MD Beeper [phone] Office [phone] 02/04/2019 4:24 PM

## 2019-02-04 NOTE — Op Note (Signed)
NAME: [name] [name], [name] [name] RECORD ZC:[id number] ACCOUNT [id number] DATE OF BIRTH:1955/11/28 FACILITY: [facility] LOCATION: [facility] PHYSICIAN:Carnell Casamento BServando Snare, MD  OPERATIVE REPORT  DATE OF PROCEDURE:  02/03/2019  PREOPERATIVE DIAGNOSIS:  Left upper lobe lung mass, question isolated pulmonary metastasis from rectal carcinoma, question fungus ball.  POSTOPERATIVE DIAGNOSIS:  Left upper lobe lung mass,final path and micro pending.   SURGICAL PROCEDURE:  Bronchoscopy, left video-assisted thoracoscopy, mini thoracotomy, left upper lobectomy with lymph node dissection, intercostal nerve block with Exparel.  SURGEON:  Lanelle Bal, MD  FIRST ASSISTANT:  Jadene Pierini, PA  BRIEF HISTORY:  The patient is a 64 year old male who was referred from [place].  Two years previously, he had had abdominoperineal resection for carcinoma of the colon.  In the spring of 2020, he began having rising CEA and cough.  CT, bronchoscopy,  and PET scan done at [facility] suggested negative pathology for malignancy, but a positive cytology.  Fungal elements were noted and on CT scan an 8 cm left upper lobe lung mass that had some slight hypermetabolic activity around the rim.  He was started  on chemotherapy without any response over time to the size of the mass.  The patient was referred by Dr. Genevive Bi for consideration of resection of fungus ball.  A repeated bronchoscopy which showed sufficient length of the upper lobe bronchus to consider  resection without requiring a pneumonectomy.  Biopsy confirmed adenocarcinoma consistent with bowel primary.  No fungal elements were noted.  CT of the brain showed no evidence of metastatic disease.  PET scan did not indicate any other areas of  metastatic disease.  After discussion with the patient and his wife that upper lobectomy may not be curative, but it was an isolated metastasis from his colon cancer he was agreeable with proceeding.  Risks and options were explained in detail.   He  signed informed consent.  DESCRIPTION OF PROCEDURE:  With arterial line and central line in place, the patient underwent general endotracheal anesthesia without incident.  A single lumen endotracheal tube was placed.  A timeout was performed.  We repeated the bronchoscopy.   Little had changed from the previous bronchoscopy with tumor involving the subsegmental bronchus of the left upper lobe, but not the main left upper lobe bronchus.  The left lower lobe bronchus was free of tumor.  Scope was removed.  A double lumen  endotracheal tube was placed.  The patient was turned in lateral decubitus position with the left side up.  Left chest was prepped with Betadine, draped in the usual sterile manner.  A 2nd timeout was performed.  We then proceeded with placing a 5 mm  port approximately at the 8th intercostal space along the posterior axillary line.  With mild insufflation, we had satisfactory collapse of the lung.  As we examined the chest the mass was easily visible following the major fissure with the lower lobe,  but did not appear directly invading the lower lobe.  Two additional 12 mm port sites were placed superiorly and anteriorly.  With these, we were able to manipulate the lung to some degree first exploring along the fissure and we carefully dissected  along the fissure.  As we tried moving the upper lobe to obtain both the anterior and posterior approach just became increasingly difficult because of the size of the mass.  The mass also appeared to be tense, but with some sponginess to it.  It had been  postulated that this was probably a mucin-producing adenocarcinoma; however,  we did not wish to rupture or open this cyst for fear of spreading tumor.  To better facilitate exposure and dissection we then expanded the upper port incision to a  utilitarian incision.  Did place [facility] retractors.  With this exposure, we then proceeded with a difficult dissection first identifying and  encircling the upper lobe pulmonary vein with a vessel loop.  This freed, we then freed the hilum posteriorly  and then dissected along the inferior border of the mass in the fissure with a long tedious dissection and elevation on the mass.  We were able to identify the pulmonary artery branches as they arose including the superior segmental artery at lower lobe  pulmonary arteries.  At this point, it was unclear if the tumor had invaded the main pulmonary artery.  With the vessels and the fissure identified, we then went back and divided the upper lobe pulmonary vein with a vascular stapler.  With this divided,  we were able to roll the mass more posteriorly and identified 2 superior lobe pulmonary artery branches and each of these was divided with a vascular load.  This gave more freedom to the mass and we were able to definitively identify where the pulmonary  artery was proceeding to the lower lobe.  From above and below, we were able to carefully dissect the inferior surface of the mass as it sat on the pulmonary artery, but did not invade it.  The pulmonary artery to the lingula was identified and divided.   This significantly freed the mass and we were able to identify the left upper lobe bronchus.  This was encircled with umbilical tape and using a purple load the bronchus was clamped.  We tested for air inflation and the lower lobe and it inflated well.   We then divided the bronchus, completed 2 fissures with purple load staplers.  Through a large specimen bag specimen was brought out.  We then proceeded with lymph node dissection submitting multiple lymph nodes and appropriately labeled specimen cups  to pathology.  The specimen was opened on the table along the stapled bronchus and in this, could see that the mucoid appearing tumor that was invading the subsegmental vessels was not adherent to our staple line on bronchus at the level of the stapler.   The specimen was sent for frozen section.   The bronchial margin was negative.  We then freed the inferior pulmonary ligament and also sampled number 8 nodes.  Using a mixture of bupivacaine and Exparel and saline, a total of 70 mL was injected along the  intercostal nerve bundles from the 3rd intercostal space to the 10th intercostal space.  Two chest tubes were left in place, a standard Argyle 28 anteriorly and a Blake drain posteriorly.  The remaining port incision was closed.  The lung was inflated  testing the bronchial stump without evidence of air leak.  2 small drill holes were placed in the lower rib and pericostal sutures were placed around the ribs.  The incision was closed with interrupted 0 Vicryl, running 2-0 Vicryl, subcutaneous tissue,  3-0 subcuticular stitch.  Sponge and needle count was reported as correct at completion of procedure.  Estimated blood loss approximately 300-350 mL.  The patient did not require any blood bank transfusions during the procedure.  He was awakened and  extubated in the operating room, transferred to the recovery room for further postoperative care, having tolerated the procedure without obvious complication.  The specimen sent to pathology.  Also, had a request for stains for fungal and yeast and also  to culture for fungus in addition to the pathologic examination.  CN/NUANCE  D:02/04/2019 T:02/04/2019 JOB:[id number]

## 2019-02-05 ENCOUNTER — Inpatient Hospital Stay (HOSPITAL_COMMUNITY): Payer: 59

## 2019-02-05 LAB — COMPREHENSIVE METABOLIC PANEL
ALT: 19 U/L (ref 0–44)
AST: 41 U/L (ref 15–41)
Albumin: 2.9 g/dL — ABNORMAL LOW (ref 3.5–5.0)
Alkaline Phosphatase: 37 U/L — ABNORMAL LOW (ref 38–126)
Anion gap: 8 (ref 5–15)
BUN: 7 mg/dL — ABNORMAL LOW (ref 8–23)
CO2: 25 mmol/L (ref 22–32)
Calcium: 8 mg/dL — ABNORMAL LOW (ref 8.9–10.3)
Chloride: 102 mmol/L (ref 98–111)
Creatinine, Ser: 0.77 mg/dL (ref 0.61–1.24)
GFR calc Af Amer: >60 mL/min (ref >60)
GFR calc non Af Amer: >60 mL/min (ref >60)
Glucose, Bld: 123 mg/dL — ABNORMAL HIGH (ref 70–99)
Potassium: 3.8 mmol/L (ref 3.5–5.1)
Sodium: 135 mmol/L (ref 135–145)
Total Bilirubin: 0.7 mg/dL (ref 0.3–1.2)
Total Protein: 5.4 g/dL — ABNORMAL LOW (ref 6.5–8.1)

## 2019-02-05 LAB — CBC
HCT: 33.4 % — ABNORMAL LOW (ref 39.0–52.0)
Hemoglobin: 10.5 g/dL — ABNORMAL LOW (ref 13.0–17.0)
MCH: 30.3 pg (ref 26.0–34.0)
MCHC: 31.4 g/dL (ref 30.0–36.0)
MCV: 96.5 fL (ref 80.0–100.0)
Platelets: 246 10*3/uL (ref 150–400)
RBC: 3.46 MIL/uL — ABNORMAL LOW (ref 4.22–5.81)
RDW: 13.8 % (ref 11.5–15.5)
WBC: 6 10*3/uL (ref 4.0–10.5)
nRBC: 0 % (ref 0.0–0.2)

## 2019-02-05 LAB — MAGNESIUM: Magnesium: 1.8 mg/dL (ref 1.7–2.4)

## 2019-02-05 LAB — SURGICAL PATHOLOGY

## 2019-02-05 MED ORDER — MAGNESIUM HYDROXIDE 400 MG/5ML PO SUSP
30.0000 mL | Freq: Every day | ORAL | Status: DC | PRN
  Administered 2019-02-05: 30 mL via ORAL
  Filled 2019-02-05: qty 30

## 2019-02-05 MED ORDER — CALCIUM CARBONATE ANTACID 500 MG PO CHEW
1.0000 | CHEWABLE_TABLET | Freq: Four times a day (QID) | ORAL | Status: DC | PRN
  Administered 2019-02-05 (×2): 200 mg via ORAL
  Filled 2019-02-05 (×4): qty 1

## 2019-02-05 MED ORDER — MAGNESIUM OXIDE 400 (241.3 MG) MG PO TABS
400.0000 mg | ORAL_TABLET | Freq: Two times a day (BID) | ORAL | Status: DC
  Administered 2019-02-05 (×2): 400 mg via ORAL
  Filled 2019-02-05 (×4): qty 1

## 2019-02-05 MED ORDER — POTASSIUM CHLORIDE CRYS ER 20 MEQ PO TBCR
20.0000 meq | EXTENDED_RELEASE_TABLET | Freq: Once | ORAL | Status: AC
  Administered 2019-02-05: 20 meq via ORAL
  Filled 2019-02-05: qty 1

## 2019-02-05 MED ORDER — LOPERAMIDE HCL 2 MG PO CAPS: 4.0000 mg | ORAL_CAPSULE | ORAL | Status: DC | PRN

## 2019-02-05 NOTE — Progress Notes (Signed)
Patient c/o of nausea, indigestion, and constipation this evening. Patient provided one dose of zofran that was effective per the patient. Patient did not have any emesis at this time. Patient received earlier dose of tums by day shift nurse. However, patient still c/o of indigestion. Patient received additional dose of tums this evening. Will reassess patient to determine whether medication was effective.  Patient states that he has been passing "a lot" of gas via ostomy these past two days. Patient has not yet passed much  stool via his ostomy since he was admitted on 02/03/19. Patient accepted to take scheduled stool softener this evening. Patient then requested additional medication to help relieve constipation. On-call provider, Roxan Hockey MD, called and received order for milk of magnesia. Patient agreed to call nurse if medication is effective.

## 2019-02-05 NOTE — Progress Notes (Signed)
There was order for chest tube remove in the morning. Explained to patient regarding this, then he told that Dr. Servando Snare didn't talk to him remove chest tube today. Paging [place], but he was in the OR. Paging Dr. Servando Snare for clarifying order, then Dr. Servando Snare will clamp the chest tube then will see progress first. No chest tube remove until Dr. Servando Snare is okay with it. HS [employer]

## 2019-02-05 NOTE — Progress Notes (Signed)
Patient moved from bed to chair, and he c/o a lot of pain. Patient refused to take oxycodone only tramadol. It was difficult time to get up or move toward to edge of bed. Explained pateint need to move and he might need oxycodone for that. He understood it, but not this time. Continue to monitor his pain. HS [employer]

## 2019-02-05 NOTE — Progress Notes (Signed)
Wasted fentanyl 3 ml and witness by Fritzi Mandes. HS [employer]

## 2019-02-05 NOTE — Progress Notes (Addendum)
[address]       [place] [address]             [phone]      2 Days Post-Op Procedure(s) (LRB): VIDEO BRONCHOSCOPY (N/A) VIDEO ASSISTED THORACOSCOPY (VATS)/LUNG RESECTION (Left) MINITHORACOTOMY (Left) LEFT UPPER LOBECTOMY WITH LYMPH NODE DISSECTION (Left) INTERCOSTAL NERVE BLOCK WITH EXPAREL (Left) Subjective: Is feeling better  Objective: Vital signs in last 24 hours: Temp:  [98.2 F (36.8 C)-98.9 F (37.2 C)] 98.2 F (36.8 C) (01/13 0330) Pulse Rate:  [88-110] 88 (01/13 0330) Cardiac Rhythm: Sinus tachycardia (01/13 0700) Resp:  [14-29] 20 (01/13 0330) BP: (120-139)/(67-82) 131/82 (01/13 0330) SpO2:  [94 %-99 %] 95 % (01/13 0721)  Hemodynamic parameters for last 24 hours:    Intake/Output from previous day: 01/12 0701 - 01/13 0700 In: 3353.9 [P.O.:1308; I.V.:2045.9] Out: 1540 [Urine:1250; Chest Tube:290] Intake/Output this shift: No intake/output data recorded.  General appearance: alert, cooperative and no distress Heart: regular rate and rhythm Lungs: some upper airway congestion  Abdomen: benign Extremities: no edema Wound: dressing CDI  Lab Results: Recent Labs    02/04/19 0225 02/05/19 0249  WBC 7.0 6.0  HGB 11.2* 10.5*  HCT 35.3* 33.4*  PLT 284 246   BMET:  Recent Labs    02/04/19 0225 02/05/19 0249  NA 137 135  K 4.3 3.8  CL 103 102  CO2 25 25  GLUCOSE 123* 123*  BUN 14 7*  CREATININE 0.87 0.77  CALCIUM 8.6* 8.0*    PT/INR: No results for input(s): LABPROT, INR in the last 72 hours. ABG    Component Value Date/Time   PHART 7.422 02/04/2019 0345   HCO3 25.0 02/04/2019 0345   TCO2 28 02/03/2019 1557   ACIDBASEDEF 1.0 02/03/2019 1520   O2SAT 97.8 02/04/2019 0345   CBG (last 3)  No results for input(s): GLUCAP in the last 72 hours.  Meds Scheduled Meds: . acetaminophen  1,000 mg Oral Q6H   Or  . acetaminophen (TYLENOL) oral liquid 160 mg/5 mL  1,000 mg Oral Q6H  . albuterol  2.5 mg Nebulization  TID  . bisacodyl  10 mg Oral Daily  . Chlorhexidine Gluconate Cloth  6 each Topical Daily  . enoxaparin (LOVENOX) injection  40 mg Subcutaneous Q24H  . fentaNYL   Intravenous Q4H  . loratadine  10 mg Oral Daily  . senna-docusate  1 tablet Oral QHS  . timolol  1 drop Right Eye QHS   Continuous Infusions: . sodium chloride    . dextrose 5 % and 0.9% NaCl 100 mL/hr at 02/05/19 0233   PRN Meds:.Place/Maintain arterial line **AND** sodium chloride, albuterol, diphenhydrAMINE **OR** diphenhydrAMINE, naloxone **AND** sodium chloride flush, ondansetron (ZOFRAN) IV, ondansetron (ZOFRAN) IV, oxyCODONE, traMADol  Xrays DG CHEST PORT 1 VIEW  Result Date: 02/04/2019 CLINICAL DATA:  Postop day 1 LEFT upper lobectomy. Personal history of rectal cancer. EXAM: PORTABLE CHEST 1 VIEW COMPARISON:  02/03/2019 and earlier. FINDINGS: Cardiac silhouette upper normal in size to slightly enlarged for AP portable technique, unchanged. Post surgical changes related to LEFT UPPER lobectomy. LEFT chest tubes in place with no pneumothorax. Minimal atelectasis at the LEFT lung base. RIGHT lung clear. No pleural effusions. LEFT subclavian Port-A-Cath tip projects at or near the cavoatrial junction, unchanged. RIGHT jugular central venous catheter tip projects over the LOWER SVC. IMPRESSION: 1. Post-operative changes related to LEFT upper lobectomy. No pneumothorax. 2. Minimal atelectasis at the LEFT lung base. No acute cardiopulmonary disease otherwise. Electronically  Signed   By: Evangeline Dakin M.D.   On: 02/04/2019 08:07   DG Chest Port 1 View  Result Date: 02/03/2019 CLINICAL DATA:  64 year old male status post VATS. EXAM: PORTABLE CHEST 1 VIEW COMPARISON:  Chest radiograph dated 02/02/2018. FINDINGS: Interval resection of the previously seen left upper lobe mass. Two left-sided chest tube with tip over the left upper lobe/left suprahilar region noted. Left pectoral Port-A-Cath with tip at the cavoatrial junction as well  as right IJ central venous line with tip over the right atrium close to the cavoatrial junction. Minimal bibasilar atelectasis. No focal consolidation, large pleural effusion, or pneumothorax. The cardiac silhouette is within normal limits. No acute osseous pathology. IMPRESSION: 1. Status post resection of left upper lobe mass.  No pneumothorax. 2. Support apparatus as described. Electronically Signed   By: Anner Crete M.D.   On: 02/03/2019 17:36    Assessment/Plan: S/P Procedure(s) (LRB): VIDEO BRONCHOSCOPY (N/A) VIDEO ASSISTED THORACOSCOPY (VATS)/LUNG RESECTION (Left) MINITHORACOTOMY (Left) LEFT UPPER LOBECTOMY WITH LYMPH NODE DISSECTION (Left) INTERCOSTAL NERVE BLOCK WITH EXPAREL (Left)   1 conts to do well POD#2 2 hemodyn stable in sinus rhythm, some HTN, was not on meds preop- follow but may need meds 3 sats good on RA 4 H/H fairly stable- not at transfusion threshold- monitor 5 normal renal fxn, adeq UOP 6 replace Mg++/K+ 7 CT 290 cc, + air leak, will d/c post chest tube only 8 CXR looks good without pntx or ASD 9 cont PCA for now 10 lovenox for DVT PPX 11 routine pulm toilet/rehab 12 path pending  LOS: 2 days    [name] PA-C 02/05/2019 Pager [phone]  Path pending  I have seen and examined [name] [name] and agree with the above assessment  and plan.  Grace Isaac MD [facility] [phone] Office [phone] 02/05/2019 8:34 AM

## 2019-02-06 ENCOUNTER — Inpatient Hospital Stay (HOSPITAL_COMMUNITY): Payer: 59

## 2019-02-06 LAB — BPAM RBC
Blood Product Expiration Date: 202102082359
Blood Product Expiration Date: 202102082359
Blood Product Expiration Date: 202102082359
Blood Product Expiration Date: 202102092359
ISSUE DATE / TIME: 202101071030
ISSUE DATE / TIME: 202101111215
ISSUE DATE / TIME: 202101111215
Unit Type and Rh: 5100
Unit Type and Rh: 5100
Unit Type and Rh: 9500
Unit Type and Rh: 9500

## 2019-02-06 LAB — TYPE AND SCREEN
ABO/RH(D): O NEG
Antibody Screen: NEGATIVE
Unit division: 0
Unit division: 0
Unit division: 0
Unit division: 0

## 2019-02-06 LAB — CBC
HCT: 37.9 % — ABNORMAL LOW (ref 39.0–52.0)
Hemoglobin: 12 g/dL — ABNORMAL LOW (ref 13.0–17.0)
MCH: 30.7 pg (ref 26.0–34.0)
MCHC: 31.7 g/dL (ref 30.0–36.0)
MCV: 96.9 fL (ref 80.0–100.0)
Platelets: 319 10*3/uL (ref 150–400)
RBC: 3.91 MIL/uL — ABNORMAL LOW (ref 4.22–5.81)
RDW: 13.7 % (ref 11.5–15.5)
WBC: 6.5 10*3/uL (ref 4.0–10.5)
nRBC: 0 % (ref 0.0–0.2)

## 2019-02-06 MED ORDER — MORPHINE SULFATE (PF) 2 MG/ML IV SOLN: 2.0000 mg | INTRAVENOUS | Status: DC | PRN

## 2019-02-06 MED ORDER — FAMOTIDINE IN NACL 20-0.9 MG/50ML-% IV SOLN
20.0000 mg | Freq: Every day | INTRAVENOUS | Status: DC
  Administered 2019-02-06 – 2019-02-10 (×6): 20 mg via INTRAVENOUS
  Filled 2019-02-06 (×6): qty 50

## 2019-02-06 MED ORDER — KETOROLAC TROMETHAMINE 15 MG/ML IJ SOLN
15.0000 mg | Freq: Three times a day (TID) | INTRAMUSCULAR | Status: DC | PRN
  Administered 2019-02-06: 15 mg via INTRAVENOUS
  Filled 2019-02-06: qty 1

## 2019-02-06 MED ORDER — KETOROLAC TROMETHAMINE 15 MG/ML IJ SOLN
15.0000 mg | Freq: Four times a day (QID) | INTRAMUSCULAR | Status: AC | PRN
  Administered 2019-02-06: 15 mg via INTRAVENOUS
  Filled 2019-02-06 (×2): qty 1

## 2019-02-06 MED ORDER — PROMETHAZINE HCL 25 MG/ML IJ SOLN
12.5000 mg | Freq: Once | INTRAMUSCULAR | Status: AC
  Administered 2019-02-06: 12.5 mg via INTRAVENOUS
  Filled 2019-02-06: qty 1

## 2019-02-06 MED ORDER — KETOROLAC TROMETHAMINE 15 MG/ML IJ SOLN: 15.0000 mg | Freq: Four times a day (QID) | INTRAMUSCULAR | Status: DC

## 2019-02-06 MED ORDER — SODIUM CHLORIDE 0.9 % IV SOLN
12.5000 mg | Freq: Four times a day (QID) | INTRAVENOUS | Status: DC | PRN
  Administered 2019-02-06 – 2019-02-09 (×7): 12.5 mg via INTRAVENOUS
  Filled 2019-02-06 (×9): qty 0.5

## 2019-02-06 MED ORDER — METOCLOPRAMIDE HCL 5 MG/ML IJ SOLN
10.0000 mg | Freq: Three times a day (TID) | INTRAMUSCULAR | Status: AC
  Administered 2019-02-06 – 2019-02-07 (×4): 10 mg via INTRAVENOUS
  Filled 2019-02-06 (×4): qty 2

## 2019-02-06 MED ORDER — ALBUTEROL SULFATE (2.5 MG/3ML) 0.083% IN NEBU
2.5000 mg | INHALATION_SOLUTION | Freq: Two times a day (BID) | RESPIRATORY_TRACT | Status: DC
  Administered 2019-02-07 – 2019-02-12 (×10): 2.5 mg via RESPIRATORY_TRACT
  Filled 2019-02-06 (×12): qty 3

## 2019-02-06 MED FILL — Sodium Chloride IV Soln 0.9%: INTRAVENOUS | Qty: 50 | Status: AC

## 2019-02-06 MED FILL — Fentanyl Citrate Preservative Free (PF) Inj 1000 MCG/20ML: INTRAMUSCULAR | Qty: 20 | Status: AC

## 2019-02-06 NOTE — Progress Notes (Addendum)
Patient continues to feel nausea and vomited x 1 brown  moderate amount. Stomach distended and firm. Patient also c/o of serve heart burn up to throat. MD notified order received may place NGT. Attempted to place unable to pass through nasal cavity will have MD to evaluate in the morning the possible need to have IR place if patient doesn't become more symptomatic tonight. Patient also agrees he doesn't want  to attempt anymore tonight. MD aware of elevated BP.Pepcid order received to give. I will continue to monitor patient.

## 2019-02-06 NOTE — Progress Notes (Signed)
Offered to change the  dressing on the chest tube site, claimed not to do it  right  now since he is not feeling well.

## 2019-02-06 NOTE — Progress Notes (Addendum)
Paged on call Thoracic surgeon for patient to have something for n/v. N/V throughout the night. Zofran given q 6 hours with no relief. Tums given earlier in shift for indigestion.  Dr. Roxan Hockey called back, ordered an NG tube for patient.  0445- Attempted to place NG tube to right nostril. Met some resistance and patient began spitting up blood, Vomited x 2 dark red emesis. Patient noted to have active nose bleed and continues to complain of blood running down his throat. Educated to place pressure to nose for 5 mins. Bleeding stopped. Stat CBC drawn through IJ line. Sent to lab.   [phone]- Patient refuses NG tube placement at this time because it caused his nose to bleed and for him to vomit blood.   0524- Paged Thoracic surgeon on call-Hendrickson again for patient's status, Updated surgeon on patient vomiting dark red emesis after tube insertion attempt. MD says that "it was probably a punctured capillary. ""he is not throwing up blood" per MD. Roxan Hockey ordered to stop Fentanyl PCA, give morphine 61m Q1 hour prn and to give phenergan 12.5 x 1 dose.   066 Explanation given to patient about his change of orders. Patient tells this nurse that he can not take morphine because it causes him to be confused and he had a change of mental status from it in the past. This nurse added morphine as an allergy.

## 2019-02-06 NOTE — Progress Notes (Signed)
Noted with on and off hiccups and burping. Ice chips given with relief. Continue to monitor.

## 2019-02-06 NOTE — Progress Notes (Signed)
Patient c/o of new onset of nausea. Patient vomited 10 ml of watery yellow liquid. Patient not yet due for next dose of zofran until  0453. Patient states that indigestion has resolved, but his throat feels "sour". Patient has still not had a BM yet via his ostomy despite receiving senokot and milk of magnesia this shift. Will continue to check for any changes - pt states he will call nurse if BM occurs.

## 2019-02-06 NOTE — Progress Notes (Signed)
Vomited to a yellowish vomitus in mod amount.  Claimed " I feel much better after I vomited." zofran  Iv given. Continue to monitor.

## 2019-02-06 NOTE — Progress Notes (Signed)
Patient ID: SAMIR ISHAQ, male   DOB: 14-Sep-1955, 64 y.o.   MRN: [id number] [facility] DAILY ICU PROGRESS NOTE                   [address]            [place] [address]          [phone]   3 Days Post-Op Procedure(s) (LRB): VIDEO BRONCHOSCOPY (N/A) VIDEO ASSISTED THORACOSCOPY (VATS)/LUNG RESECTION (Left) MINITHORACOTOMY (Left) LEFT UPPER LOBECTOMY WITH LYMPH NODE DISSECTION (Left) INTERCOSTAL NERVE BLOCK WITH EXPAREL (Left)  Total Length of Stay:  LOS: 3 days   Subjective: Increased nausea overnight, some abdominal distention, he does have some gas in his colostomy  Objective: Vital signs in last 24 hours: Temp:  [97.7 F (36.5 C)-98.5 F (36.9 C)] 98.5 F (36.9 C) (01/13 2323) Pulse Rate:  [85-93] 88 (01/14 0725) Cardiac Rhythm: Normal sinus rhythm (01/14 0700) Resp:  [14-20] 17 (01/14 0725) BP: (140-156)/(87-97) 155/97 (01/13 2323) SpO2:  [95 %-98 %] 97 % (01/14 0725)  Filed Weights   02/03/19 0618  Weight: 122.6 kg    Weight change:    Hemodynamic parameters for last 24 hours:    Intake/Output from previous day: 01/13 0701 - 01/14 0700 In: 3107.2 [P.O.:1140; I.V.:1967.2] Out: 1905 [OZHYQ:6578; Emesis/NG output:10; Chest Tube:180]  Intake/Output this shift: No intake/output data recorded.  Current Meds: Scheduled Meds: . acetaminophen  1,000 mg Oral Q6H   Or  . acetaminophen (TYLENOL) oral liquid 160 mg/5 mL  1,000 mg Oral Q6H  . albuterol  2.5 mg Nebulization TID  . bisacodyl  10 mg Oral Daily  . Chlorhexidine Gluconate Cloth  6 each Topical Daily  . enoxaparin (LOVENOX) injection  40 mg Subcutaneous Q24H  . loratadine  10 mg Oral Daily  . magnesium oxide  400 mg Oral BID  . senna-docusate  1 tablet Oral QHS  . timolol  1 drop Right Eye QHS   Continuous Infusions: . sodium chloride    . dextrose 5 % and 0.9% NaCl 100 mL/hr at 02/06/19 0000   PRN Meds:.Place/Maintain arterial line **AND** sodium chloride, albuterol, calcium  carbonate, loperamide, magnesium hydroxide, ondansetron (ZOFRAN) IV, oxyCODONE, traMADol  General appearance: alert, cooperative and fatigued Neurologic: intact Heart: regular rate and rhythm, S1, S2 normal, no murmur, click, rub or gallop Lungs: diminished breath sounds bibasilar Abdomen: Abdomen is mildly distended hypoactive bowel sounds Extremities: extremities normal, atraumatic, no cyanosis or edema Wound: Small air leak with cough  Lab Results: CBC: Recent Labs    02/05/19 0249 02/06/19 0453  WBC 6.0 6.5  HGB 10.5* 12.0*  HCT 33.4* 37.9*  PLT 246 319   BMET:  Recent Labs    02/04/19 0225 02/05/19 0249  NA 137 135  K 4.3 3.8  CL 103 102  CO2 25 25  GLUCOSE 123* 123*  BUN 14 7*  CREATININE 0.87 0.77  CALCIUM 8.6* 8.0*    CMET: Lab Results  Component Value Date   WBC 6.5 02/06/2019   HGB 12.0 (L) 02/06/2019   HCT 37.9 (L) 02/06/2019   PLT 319 02/06/2019   GLUCOSE 123 (H) 02/05/2019   CHOL 147 03/22/2018   TRIG 126 03/22/2018   HDL 35 (L) 03/22/2018   LDLCALC 89 03/22/2018   ALT 19 02/05/2019   AST 41 02/05/2019   NA 135 02/05/2019   K 3.8 02/05/2019   CL 102 02/05/2019   CREATININE 0.77 02/05/2019   BUN 7 (L) 02/05/2019  CO2 25 02/05/2019   TSH 3.96 07/03/2016   PSA 1.7 07/03/2016   INR 0.9 01/30/2019      PT/INR: No results for input(s): LABPROT, INR in the last 72 hours. Radiology: No results found.   Assessment/Plan: S/P Procedure(s) (LRB): VIDEO BRONCHOSCOPY (N/A) VIDEO ASSISTED THORACOSCOPY (VATS)/LUNG RESECTION (Left) MINITHORACOTOMY (Left) LEFT UPPER LOBECTOMY WITH LYMPH NODE DISSECTION (Left) INTERCOSTAL NERVE BLOCK WITH EXPAREL (Left) Decrease narcotic pain medicine with ileus Keep n.p.o. other than rare ice chips for now Try Toradol for pain Chest tube to waterseal Abdominal film to evaluate ileus.     Grace Isaac 02/06/2019 8:16 AM

## 2019-02-06 NOTE — Progress Notes (Signed)
[address]       ,[place] [address]             [phone]                 3 Days Post-Op Procedure(s) (LRB): VIDEO BRONCHOSCOPY (N/A) VIDEO ASSISTED THORACOSCOPY (VATS)/LUNG RESECTION (Left) MINITHORACOTOMY (Left) LEFT UPPER LOBECTOMY WITH LYMPH NODE DISSECTION (Left) INTERCOSTAL NERVE BLOCK WITH EXPAREL (Left)  LOS: 3 days   Subjective: Abdominal film shows evidence of ileus, patient vomited x1, has been nauseated, increasing air from colostomy during the day today  Now mostly bothered by intractable hiccups, he and his wife note that this happened after his previous colon surgery also  Objective: Vital signs in last 24 hours: Patient Vitals for the past 24 hrs:  BP Temp Temp src Pulse Resp SpO2  02/06/19 1549 (!) 163/101 99 F (37.2 C) Axillary 93 19 94 %  02/06/19 1052 (!) 164/96 98.3 F (36.8 C) Oral 87 19 96 %  02/06/19 0834 (!) 147/93 98.7 F (37.1 C) Oral 85 14 97 %  02/06/19 0725 -- -- -- 88 17 97 %  02/05/19 2323 (!) 155/97 98.5 F (36.9 C) Oral 85 20 96 %  02/05/19 1927 (!) 154/97 97.9 F (36.6 C) Oral 92 20 97 %  02/05/19 1748 -- -- -- -- 14 98 %    Filed Weights   02/03/19 0618  Weight: 122.6 kg    Hemodynamic parameters for last 24 hours:    Intake/Output from previous day: 01/13 0701 - 01/14 0700 In: 3107.2 [P.O.:1140; I.V.:1967.2] Out: 1905 [Urine:1715; Emesis/NG output:10; Chest Tube:180] Intake/Output this shift: Total I/O In: 900 [I.V.:900] Out: 200 [Urine:200]  Scheduled Meds: . acetaminophen  1,000 mg Oral Q6H   Or  . acetaminophen (TYLENOL) oral liquid 160 mg/5 mL  1,000 mg Oral Q6H  . albuterol  2.5 mg Nebulization BID  . bisacodyl  10 mg Oral Daily  . Chlorhexidine Gluconate Cloth  6 each Topical Daily  . enoxaparin (LOVENOX) injection  40 mg Subcutaneous Q24H  . loratadine  10 mg Oral Daily  . magnesium oxide  400 mg Oral BID  . metoCLOPramide (REGLAN) injection  10 mg Intravenous Q8H  .  senna-docusate  1 tablet Oral QHS  . timolol  1 drop Right Eye QHS   Continuous Infusions: . sodium chloride    . chlorproMAZINE (THORAZINE) IV    . dextrose 5 % and 0.9% NaCl 100 mL/hr at 02/06/19 1130   PRN Meds:.Place/Maintain arterial line **AND** sodium chloride, albuterol, calcium carbonate, chlorproMAZINE (THORAZINE) IV, ketorolac, loperamide, magnesium hydroxide, ondansetron (ZOFRAN) IV, oxyCODONE, traMADol    Lab Results: CBC: Recent Labs    02/05/19 0249 02/06/19 0453  WBC 6.0 6.5  HGB 10.5* 12.0*  HCT 33.4* 37.9*  PLT 246 319   BMET:  Recent Labs    02/04/19 0225 02/05/19 0249  NA 137 135  K 4.3 3.8  CL 103 102  CO2 25 25  GLUCOSE 123* 123*  BUN 14 7*  CREATININE 0.87 0.77  CALCIUM 8.6* 8.0*    PT/INR: No results for input(s): LABPROT, INR in the last 72 hours.   Radiology DG CHEST PORT 1 VIEW  Result Date: 02/06/2019 CLINICAL DATA:  Follow-up lobectomy EXAM: PORTABLE CHEST 1 VIEW COMPARISON:  02/05/2019 FINDINGS: Two left chest tubes remain in place. No visible pleural air. Power port is unchanged with tip in the right atrium. Right internal jugular tip is at the SVC  RA junction. The lungs are well aerated. IMPRESSION: Lungs well aerated.  No pneumothorax. Electronically Signed   By: Nelson Chimes M.D.   On: 02/06/2019 08:25   DG Chest Port 1 View In am  Result Date: 02/05/2019 CLINICAL DATA:  Status update.  Chest tube EXAM: PORTABLE CHEST 1 VIEW COMPARISON:  Yesterday FINDINGS: Stable positioning of central lines. Stable left chest tube positioning with no visible pneumothorax or pleural fluid. Stable postoperative left-sided volume loss. Stable cardiomediastinal widening. IMPRESSION: No visible pneumothorax or change in hardware positioning. Electronically Signed   By: Monte Fantasia M.D.   On: 02/05/2019 08:41   DG Abd Acute W/Chest  Result Date: 02/06/2019 CLINICAL DATA:  Recent lung surgery.  Chest tube. EXAM: DG ABDOMEN ACUTE W/ 1V CHEST  COMPARISON:  02/06/2019. FINDINGS: Two left chest tubes in stable position. Left PowerPort catheter and right IJ line stable position. No pneumothorax. Low lung volumes with bibasilar atelectasis. Stable cardiomegaly. Lower abdomen is not imaged. Surgical clips right upper quadrant. Gastric distention noted. Multiple distended loops of small bowel are noted. Stool is present in the colon. No free air. Diffuse thoracolumbar spine degenerative change. IMPRESSION: 1. Lines and tubes including 2 left chest tubes are in stable position. No pneumothorax. Bibasilar atelectasis. 2. Limited abdominal valuation, the lower abdomen was not imaged. Gastric distention. Multiple distended loops of small bowel. Small-bowel obstruction cannot be excluded. Electronically Signed   By: Marcello Moores  Register   On: 02/06/2019 09:53     Assessment/Plan: S/P Procedure(s) (LRB): VIDEO BRONCHOSCOPY (N/A) VIDEO ASSISTED THORACOSCOPY (VATS)/LUNG RESECTION (Left) MINITHORACOTOMY (Left) LEFT UPPER LOBECTOMY WITH LYMPH NODE DISSECTION (Left) INTERCOSTAL NERVE BLOCK WITH EXPAREL (Left) Keep n.p.o. Try IV Thorazine to assist with hiccups intractable    Grace Isaac MD 02/06/2019 5:22 PM

## 2019-02-07 ENCOUNTER — Inpatient Hospital Stay (HOSPITAL_COMMUNITY): Payer: 59

## 2019-02-07 LAB — COMPREHENSIVE METABOLIC PANEL
ALT: 31 U/L (ref 0–44)
AST: 33 U/L (ref 15–41)
Albumin: 2.6 g/dL — ABNORMAL LOW (ref 3.5–5.0)
Alkaline Phosphatase: 43 U/L (ref 38–126)
Anion gap: 10 (ref 5–15)
BUN: 19 mg/dL (ref 8–23)
CO2: 26 mmol/L (ref 22–32)
Calcium: 8.5 mg/dL — ABNORMAL LOW (ref 8.9–10.3)
Chloride: 102 mmol/L (ref 98–111)
Creatinine, Ser: 0.86 mg/dL (ref 0.61–1.24)
GFR calc Af Amer: >60 mL/min (ref >60)
GFR calc non Af Amer: >60 mL/min (ref >60)
Glucose, Bld: 132 mg/dL — ABNORMAL HIGH (ref 70–99)
Potassium: 3.8 mmol/L (ref 3.5–5.1)
Sodium: 138 mmol/L (ref 135–145)
Total Bilirubin: 0.7 mg/dL (ref 0.3–1.2)
Total Protein: 5.5 g/dL — ABNORMAL LOW (ref 6.5–8.1)

## 2019-02-07 LAB — CBC
HCT: 35 % — ABNORMAL LOW (ref 39.0–52.0)
Hemoglobin: 11.2 g/dL — ABNORMAL LOW (ref 13.0–17.0)
MCH: 30.8 pg (ref 26.0–34.0)
MCHC: 32 g/dL (ref 30.0–36.0)
MCV: 96.2 fL (ref 80.0–100.0)
Platelets: 354 10*3/uL (ref 150–400)
RBC: 3.64 MIL/uL — ABNORMAL LOW (ref 4.22–5.81)
RDW: 13.8 % (ref 11.5–15.5)
WBC: 6 10*3/uL (ref 4.0–10.5)
nRBC: 0 % (ref 0.0–0.2)

## 2019-02-07 LAB — AMYLASE: Amylase: 76 U/L (ref 28–100)

## 2019-02-07 MED ORDER — KETOROLAC TROMETHAMINE 15 MG/ML IJ SOLN
15.0000 mg | Freq: Four times a day (QID) | INTRAMUSCULAR | Status: AC | PRN
  Administered 2019-02-07 – 2019-02-11 (×9): 15 mg via INTRAVENOUS
  Filled 2019-02-07 (×9): qty 1

## 2019-02-07 MED ORDER — METOCLOPRAMIDE HCL 5 MG/ML IJ SOLN
10.0000 mg | Freq: Three times a day (TID) | INTRAMUSCULAR | Status: AC
  Administered 2019-02-07 – 2019-02-10 (×9): 10 mg via INTRAVENOUS
  Filled 2019-02-07 (×9): qty 2

## 2019-02-07 MED ORDER — HYDRALAZINE HCL 20 MG/ML IJ SOLN: 5.0000 mg | Freq: Three times a day (TID) | INTRAMUSCULAR | Status: DC | PRN

## 2019-02-07 MED ORDER — HYDRALAZINE HCL 20 MG/ML IJ SOLN
10.0000 mg | Freq: Three times a day (TID) | INTRAMUSCULAR | Status: DC | PRN
  Administered 2019-02-10: 10 mg via INTRAVENOUS
  Filled 2019-02-07: qty 1

## 2019-02-07 NOTE — Progress Notes (Signed)
Patient ID: [name] [name], male   DOB: 06-16-1955, 64 y.o.   MRN: [id number]      [address]       [place] [address]             [phone]                 4 Days Post-Op Procedure(s) (LRB): VIDEO BRONCHOSCOPY (N/A) VIDEO ASSISTED THORACOSCOPY (VATS)/LUNG RESECTION (Left) MINITHORACOTOMY (Left) LEFT UPPER LOBECTOMY WITH LYMPH NODE DISSECTION (Left) INTERCOSTAL NERVE BLOCK WITH EXPAREL (Left)  LOS: 4 days   Subjective: Continued nausea and vomited,  Objective: Vital signs in last 24 hours: Patient Vitals for the past 24 hrs:  BP Temp Temp src Pulse Resp SpO2  02/07/19 1139 (!) 171/88 98.4 F (36.9 C) Oral 95 (!) 29 96 %  02/07/19 0816 (!) 170/90 98.3 F (36.8 C) Axillary (!) 109 16 96 %  02/07/19 0749 -- -- -- (!) 105 18 97 %  02/07/19 0400 -- -- -- -- 20 --  02/07/19 0355 -- -- -- -- 15 --  02/07/19 0337 -- 98.7 F (37.1 C) Oral -- -- --  02/07/19 0300 (!) 161/91 -- -- (!) 102 14 96 %  02/07/19 0000 -- -- -- -- 14 --  02/06/19 2355 -- -- -- -- 13 --  02/06/19 2348 (!) 153/93 98.4 F (36.9 C) Oral 95 13 96 %  02/06/19 2002 (!) 153/106 99.2 F (37.3 C) Oral (!) 110 20 94 %  02/06/19 1549 (!) 163/101 99 F (37.2 C) Axillary 93 19 94 %    Filed Weights   02/03/19 0618  Weight: 122.6 kg    Hemodynamic parameters for last 24 hours:    Intake/Output from previous day: 01/14 0701 - 01/15 0700 In: 2313 [I.V.:2288; IV Piggyback:25] Out: 760 [Urine:500; Stool:150; Chest Tube:110] Intake/Output this shift: Total I/O In: 449.4 [I.V.:449.4] Out: 300 [Urine:300]  Scheduled Meds: . acetaminophen  1,000 mg Oral Q6H   Or  . acetaminophen (TYLENOL) oral liquid 160 mg/5 mL  1,000 mg Oral Q6H  . albuterol  2.5 mg Nebulization BID  . bisacodyl  10 mg Oral Daily  . Chlorhexidine Gluconate Cloth  6 each Topical Daily  . enoxaparin (LOVENOX) injection  40 mg Subcutaneous Q24H  . loratadine  10 mg Oral Daily  . magnesium oxide  400 mg Oral BID  .  senna-docusate  1 tablet Oral QHS  . timolol  1 drop Right Eye QHS   Continuous Infusions: . sodium chloride    . chlorproMAZINE (THORAZINE) IV 12.5 mg (02/07/19 0006)  . dextrose 5 % and 0.9% NaCl 100 mL/hr at 02/07/19 1200  . famotidine (PEPCID) IV 20 mg (02/06/19 2204)   PRN Meds:.Place/Maintain arterial line **AND** sodium chloride, albuterol, calcium carbonate, chlorproMAZINE (THORAZINE) IV, hydrALAZINE, ketorolac, loperamide, magnesium hydroxide, ondansetron (ZOFRAN) IV, oxyCODONE, traMADol    Lab Results: CBC: Recent Labs    02/06/19 0453 02/07/19 0512  WBC 6.5 6.0  HGB 12.0* 11.2*  HCT 37.9* 35.0*  PLT 319 354   BMET:  Recent Labs    02/05/19 0249 02/07/19 0512  NA 135 138  K 3.8 3.8  CL 102 102  CO2 25 26  GLUCOSE 123* 132*  BUN 7* 19  CREATININE 0.77 0.86  CALCIUM 8.0* 8.5*    PT/INR: No results for input(s): LABPROT, INR in the last 72 hours.   Radiology DG Chest Port 1 View In am  Result Date: 02/07/2019 CLINICAL DATA:  Recent left  lobectomy EXAM: PORTABLE CHEST 1 VIEW COMPARISON:  February 06, 2019 FINDINGS: Chest tubes are again noted on the left. There is an apical and lateral pneumothorax on the left which is more apparent than 1 day prior. No tension component. Port-A-Cath tip is at the cavoatrial junction. Right jugular catheter tip is in the superior vena cava. There is atelectatic change in the medial lung bases. Lungs elsewhere clear. Heart is upper normal in size with pulmonary vascularity normal. No adenopathy. Postoperative changes noted on the left. No bone lesions. IMPRESSION: Tube and catheter positions as described. Left-sided apical and lateral pneumothorax that tension component, more evident than on 1 day prior. Bibasilar atelectasis. Stable cardiac silhouette. Postoperative changes noted on the left. Electronically Signed   By: Lowella Grip III M.D.   On: 02/07/2019 07:59   DG CHEST PORT 1 VIEW  Result Date: 02/06/2019 CLINICAL DATA:   Follow-up lobectomy EXAM: PORTABLE CHEST 1 VIEW COMPARISON:  02/05/2019 FINDINGS: Two left chest tubes remain in place. No visible pleural air. Power port is unchanged with tip in the right atrium. Right internal jugular tip is at the SVC RA junction. The lungs are well aerated. IMPRESSION: Lungs well aerated.  No pneumothorax. Electronically Signed   By: Nelson Chimes M.D.   On: 02/06/2019 08:25   DG Abd Acute W/Chest  Result Date: 02/06/2019 CLINICAL DATA:  Recent lung surgery.  Chest tube. EXAM: DG ABDOMEN ACUTE W/ 1V CHEST COMPARISON:  02/06/2019. FINDINGS: Two left chest tubes in stable position. Left PowerPort catheter and right IJ line stable position. No pneumothorax. Low lung volumes with bibasilar atelectasis. Stable cardiomegaly. Lower abdomen is not imaged. Surgical clips right upper quadrant. Gastric distention noted. Multiple distended loops of small bowel are noted. Stool is present in the colon. No free air. Diffuse thoracolumbar spine degenerative change. IMPRESSION: 1. Lines and tubes including 2 left chest tubes are in stable position. No pneumothorax. Bibasilar atelectasis. 2. Limited abdominal valuation, the lower abdomen was not imaged. Gastric distention. Multiple distended loops of small bowel. Small-bowel obstruction cannot be excluded. Electronically Signed   By: Marcello Moores  Register   On: 02/06/2019 09:53     Assessment/Plan: S/P Procedure(s) (LRB): VIDEO BRONCHOSCOPY (N/A) VIDEO ASSISTED THORACOSCOPY (VATS)/LUNG RESECTION (Left) MINITHORACOTOMY (Left) LEFT UPPER LOBECTOMY WITH LYMPH NODE DISSECTION (Left) INTERCOSTAL NERVE BLOCK WITH EXPAREL (Left)  Ng tube placed left nostril without difficulty - vomited while placing tube  Keep stomach decompressed    Grace Isaac MD 02/07/2019 2:38 PM

## 2019-02-07 NOTE — Discharge Summary (Signed)
[address]       [place] [address]             [phone]      Physician Discharge Summary  Patient ID: SCHYLER COUNSELL MRN: [id number] DOB/AGE: [birth year] 64 y.o.  Admit date: 02/03/2019 Discharge date: 02/14/2019  Admission Diagnoses:  Patient Active Problem List   Diagnosis Date Noted  . Mass of left lung 02/03/2019  . Lung mass 01/28/2019  . Medication monitoring encounter 12/25/2018  . Fungal infection of lung 12/10/2018  . Goals of care, counseling/discussion 07/27/2018  . Glaucoma of right eye 03/22/2018  . Erectile dysfunction 03/22/2018  . Class 2 severe obesity due to excess calories with serious comorbidity and body mass index (BMI) of 35.0 to 35.9 in adult ([place]) 03/22/2018  . Decreased vision of right eye 09/21/2017  . Colostomy in place [facility]) 09/21/2017  . Hypogonadism in male 09/21/2017  . Seasonal allergies   . Degeneration of lumbar intervertebral disc 06/26/2017  . Rectal cancer ([place]) 08/05/2014    Discharge Diagnoses:  Active Problems:   Mass of left lung   Discharged Condition: good  HPI:   Alazar L Stevick 64 y.o. male has  seen in the officefor  evaluation of left hilar mass.  The patient presented in April 2015 with a rectal carcinoma, PET scan at that time suggested a large mass at the anal verge extending into the perineal fat with nodal involvement of what right inguinal lymph node and mesorectal lymph nodes.  PET scan in 2015 showed no abnormalities in the chest per report.  He was treated with preoperative radiation and chemotherapy, underwent robot-assisted abdominal perineal resection.  Final pathology showed 1.5 cm tumor, 3 lymph examined all negative, pathologic staging  pT2pN0 . Patient CEA has gone from 3.0 in October 2018 to 5.9 in September 2020. Because of complaint of cough in March 2020 and rising CEA, CT of the chest abdomen and pelvis was performed and subsequent PET scan June 2020.  CT and PET scan showed a  large left hilar mass.  Bronchoscopy with biopsy had positive cytology consistent with adenocarcinoma of the colon.  Pathologic biopsy was negative for tumor, but did show:"SEPTATE AND BRANCHING FUNGAL HYPHAE, CONSISTENT WITH FUNGUS BALL".  Subsequent KOH preps and cultures have been negative for fungus.  the patient subsequently had a Port-A-Cath placed and underwent chemotherapy treatment for stage IV colon cancer.   Follow-up PET scan shows persistent left hilar mass with little change in size.  7.6 x 6.6 cm in size with attenuated metabolic activity along the margins SUV 3.1 previously 5.0.   Patient was  referred to me y for consideration of pneumonectomy with dx of "fungus ball"   Patient is a lifelong cigarette non-smoker, he notes occasional cigar and chews tobacco.  He denies any known cardiac history.  He does note some exertional shortness of breath but is able to carry on usual daily activities without symptoms.   Since last seen the patient was seen by infectious disease and started on treatment with antifungal agent.   I had the cytology from [place] reviewed-originally reported as positive cytology consistent with adenocarcinoma the colon, subsequent review by pathologist agreed with a malignant pathology but suggested it may be lung primary not colon.   Pulmonary function studies were done last week FEV1 2.9 predicted 4.17 69% DLCO 21.57 predicted 31.4  68% predicted   Hospital Course:   On 02/03/2019 Mr. Matton underwent a bronchoscopy,  left video-assisted thoracoscopy, minithoracotomy, left upper lobectomy with lymph node dissection and intercostal nerve block Dr. Servando Snare.  He tolerated the procedure well was transferred to [facility] for continued care.  Postop day 1 he was hemodynamically stable with short bursts of SVT but mostly remained in normal sinus rhythm.  We discontinued his arterial line.  His saturation level remained adequate on room air.  He had a positive air  leak but his chest x-ray was without pneumothorax.  We continue Lovenox for DVT prophylaxis.  His initial path was adenocarcinoma with extracellular mucin.  Postop day 2 he had some hypertension but otherwise remained stable.  His chest tube put out good amount of fluid with a positive air leak therefore we discontinued his posterior chest tube only today.  We will continue to follow his pathology report.  Patient complained of nausea indigestion and constipation the evening of 02/05/2019.  His nausea and vomiting continued to the next morning.  We continue to treat with Zofran and gave Tums.  Eventually we placed an NG tube.  While the NG tube was being placed some resistance was met and the patient began spitting up blood and vomited 2 times.  Patient noted to have an active nosebleed.  After pressure was placed a nosebleed stopped after about 5 minutes.  On 02/06/2019 he had some increased nausea overnight and some abdominal distention.  He did have some gas in his colostomy bag.  We plan to decrease his narcotic pain medication with his now new ileus.  We kept him n.p.o.  We tried Toradol for pain control and changed his chest tube to waterseal.  On 02/06/2019 we continued him n.p.o.  We tried IV Thorazine to assist in an retractable hiccups.  Postop day 4 he had about a 10 to 15% pneumothorax on chest x-ray.  We placed his chest tube back to 10 cm of suction.  His hiccups have improved.  We were still unable to place an NG tube but his abdomen is much softer this a.m.  He did have some stool passing to his colostomy bag.  We did get a general surgery consultation to assist with management.  Over the next few days the ileus did resolve and the NG tube was removed.  He has been started on a diet and is tolerating this without significant difficulty.  Pathology has returned and this has revealed adenocarcinoma.  Please see below for the full report.  His chest tubes have been removed following routine management and  chest x-ray has remained stable.  He has an approximate 5% apical space which has been stable on repeat x-rays.  Oxygen has been weaned and he maintains good saturations on room air.  He has tolerated gradually increasing activities using standard rehab protocols.  Incisions are noted to be healing well without evidence of infection.  He does have an expected acute blood loss anemia but values are stabilized.  Renal function has remained within normal limits.  He has required a fair amount of potassium supplementation.  The patient has been hypertensive and was initially treated with intermittent dosing of as needed hydralazine.  It was felt that he should be started on oral amlodipine and his blood pressure control is much better at this time.  He does not know he will need to follow-up with primary care regarding ongoing management of hypertension.  At the time of discharge the patient is felt to be quite stable.   Consults: None  Significant Diagnostic Studies:  CLINICAL DATA:  Recent left lobectomy  EXAM: PORTABLE CHEST 1 VIEW  COMPARISON:  February 06, 2019  FINDINGS: Chest tubes are again noted on the left. There is an apical and lateral pneumothorax on the left which is more apparent than 1 day prior. No tension component. Port-A-Cath tip is at the cavoatrial junction. Right jugular catheter tip is in the superior vena cava. There is atelectatic change in the medial lung bases. Lungs elsewhere clear. Heart is upper normal in size with pulmonary vascularity normal. No adenopathy. Postoperative changes noted on the left. No bone lesions.  IMPRESSION: Tube and catheter positions as described. Left-sided apical and lateral pneumothorax that tension component, more evident than on 1 day prior. Bibasilar atelectasis. Stable cardiac silhouette. Postoperative changes noted on the left.   Electronically Signed   By: Lowella Grip III M.D.   On: 02/07/2019  07:59    Treatments:   NAME: Engelken, Clifton MV:[id number] ACCOUNT [id number] DATE OF BIRTH:1955-10-28 FACILITY: [facility] LOCATION: [facility] PHYSICIAN:EDWARD BServando Snare, MD  OPERATIVE REPORT  DATE OF PROCEDURE:  02/03/2019  PREOPERATIVE DIAGNOSIS:  Left upper lobe lung mass, question isolated pulmonary metastasis from rectal carcinoma, question fungus ball.  POSTOPERATIVE DIAGNOSIS:  Left upper lobe lung mass,final path and micro pending.   SURGICAL PROCEDURE:  Bronchoscopy, left video-assisted thoracoscopy, mini thoracotomy, left upper lobectomy with lymph node dissection, intercostal nerve block with Exparel.  SURGEON:  Lanelle Bal, MD  FIRST ASSISTANT:  Jadene Pierini, PA  BRIEF HISTORY:  The patient is a 64 year old male who was referred from [place].  Two years previously, he had had abdominoperineal resection for carcinoma of the colon.  In the spring of 2020, he began having rising CEA and cough.  CT, bronchoscopy,  and PET scan done at [facility] suggested negative pathology for malignancy, but a positive cytology.  Fungal elements were noted and on CT scan an 8 cm left upper lobe lung mass that had some slight hypermetabolic activity around the rim.  He was started  on chemotherapy without any response over time to the size of the mass.  The patient was referred by Dr. Genevive Bi for consideration of resection of fungus ball.  A repeated bronchoscopy which showed sufficient length of the upper lobe bronchus to consider  resection without requiring a pneumonectomy.  Biopsy confirmed adenocarcinoma consistent with bowel primary.  No fungal elements were noted.  CT of the brain showed no evidence of metastatic disease.  PET scan did not indicate any other areas of  metastatic disease.  After discussion with the patient and his wife that upper lobectomy may not be curative, but it was an isolated metastasis from his colon cancer he was agreeable with proceeding.  Risks  and options were explained in detail.  He  signed informed consent.   SURGICAL PATHOLOGY Surgical pathology Collected: 02/03/19 0958  Lab status: Final-Edited  Resulting lab: [facility]  Value: SURGICAL PATHOLOGY  CASE: [id number]  PATIENT: Amiir Lowy  Surgical Pathology Report      Clinical History: Lung mass (cm)      FINAL MICROSCOPIC DIAGNOSIS:   A. LYMPH NODE, 5, BIOPSY:  - One lymph node, negative for carcinoma (0/1).   B. LYMPH NODE, 10L, BIOPSY:  - One lymph node, negative for carcinoma (0/1).   C. LUNG, LEFT UPPER LOBE, LOBECTOMY:  - Adenocarcinoma with extracellular mucin, 10.3 cm.  - Margins of resection are not involved.  - See comment.   D. LYMPH NODE, 11L, BIOPSY:  -  One lymph node, negative for carcinoma (0/1).   E. LYMPH NODE, 11L #2, BIOPSY:  - One lymph node, negative for carcinoma (0/1).   F. LYMPH NODE, 11L #3, BIOPSY:  - Fragment of adenocarcinoma.  - Detached fragment of fibroadipose tissue.  - No distinct nodal tissue identified.   G. LYMPH NODE, 12L, BIOPSY:  - One lymph node, negative for carcinoma (0/1).   H. LYMPH NODE, 4L, BIOPSY:  - One lymph node, negative for carcinoma (0/1).   I. LYMPH NODE, 8, BIOPSY:  - One lymph node, negative for carcinoma (0/1).   COMMENT:   C. By morphology and review of the record, the findings are compatible  with the provided clinical history of colorectal carcinoma.   INTRAOPERATIVE DIAGNOSIS:    C. Left upper lobe bronchial margin: "Negative for neoplasm."  Intraoperative diagnosis rendered by Dr. Jeannie Done at 3:20 PM on  02/03/2019.   GROSS DESCRIPTION:   A: Received fresh is a 0.8 cm anthracotic lymph node. Submitted in toto  in 1 cassette.   B: Received fresh is a 1.2 cm anthracotic lymph node. Submitted in toto  in 1 cassette.   C: Specimen: Left upper lobectomy, received fresh for rapid  intraoperative consult.  Specimen integrity: The pleura is previously  incised.  Size, weight: 23.5 x 15.2 x 8.7 cm, 499 g.  Pleura: The pleura is previously incised and there is mucinous material  exuding from the incision sites.  Lesion: The cut surface shows a 10.3 x 8.2 x 6.8 cm well-circumscribed  mass. The cut surface is tan-white and mucinous. The mass extends to  within 1 cm of the bronchial margin.  Margins: The bronchial margin is grossly free of tumor and is submitted  for frozen section.  Hilar vessels: Grossly free of tumor.  Nonneoplastic parenchyma: Spongy, red-brown.  Lymph nodes: None.  Block Summary: 9 blocks submitted.  1 = bronchial margin submitted for frozen section.  2 = vascular margins.  3-8 = tumor.  9 = uninvolved parenchyma.   D: Received fresh is a 1.7 cm anthracotic lymph node. Sectioned and  entirely submitted in 1 cassette.   E: Received fresh is a 0.7 cm anthracotic lymph node. Bisected and  entirely submitted in 1 cassette.   F: Received fresh are two fragments of tan-pink soft tissue, each 0.4  cm. Submitted in toto in 1 cassette.   G: Received fresh is a 0.7 cm anthracotic lymph node. Submitted in toto  in 1 cassette.   H: Received fresh is a 1.1 cm anthracotic lymph node. Sectioned and  entirely submitted in 1 cassette.   I: Received fresh is a 1 cm anthracotic lymph node. Submitted in toto  in 1 cassette. [facility] 02/04/2019)   Final Diagnosis performed by Gillie Manners, MD.  Electronically  signed 02/05/2019  Technical and / or Professional components performed at [facility]. [facility], [place] [address], [place], [place] [address].  Immunohistochemistry Technical component (if applicable) was performed  at [facility]. [address] [place],  [place], [place] [address].  IMMUNOHISTOCHEMISTRY DISCLAIMER (if applicable):  Some of these immunohistochemical stains may have been developed and the  performance characteristics determine by [facility]. Some   may not have been cleared or approved by the U.S. Food and Drug  Administration. The FDA has determined that such clearance or approval  is not necessary. This test is used for clinical purposes. It should not  be regarded as investigational or for research.  This laboratory is  certified under the [place]  (CLIA-88) as qualified to perform high complexity clinical laboratory  testing. The controls stained appropriately.      Discharge Exam: Blood pressure 124/78, pulse 70, temperature 98.3 F (36.8 C), temperature source Oral, resp. rate 16, height '6\' 3"'  (1.905 m), weight 122.6 kg, SpO2 98 %.    General appearance: alert, cooperative and no distress Heart: regular rate and rhythm Lungs: clear to auscultation bilaterally Abdomen: benign Extremities: no edema or calf tenderness Wound: incis healing well  Disposition: Discharge disposition: 01-Home or Self Care       Discharge Instructions    Discharge patient   Complete by: As directed    Discharge disposition: 01-Home or Self Care   Discharge patient date: 02/14/2019     Allergies as of 02/14/2019      Reactions   Sulfa Antibiotics Hives   Morphine And Related Other (See Comments)   Altered mental status      Medication List    STOP taking these medications   acetaminophen 650 MG CR tablet Commonly known as: TYLENOL     TAKE these medications   AFRIN EXTRA MOISTURIZING NA Place 1 spray into the nose 2 (two) times daily as needed (congestion.).   albuterol 108 (90 Base) MCG/ACT inhaler Commonly known as: VENTOLIN HFA Inhale 2 puffs into the lungs every 6 (six) hours as needed for wheezing or shortness of breath.   amLODipine 10 MG tablet Commonly known as: NORVASC Take 1 tablet (10 mg total) by mouth daily.   aspirin-acetaminophen-caffeine 250-250-65 MG tablet Commonly known as: EXCEDRIN MIGRAINE Take 2 tablets by mouth every 6 (six) hours as needed for  headache.   aspirin-sod bicarb-citric acid 325 MG Tbef tablet Commonly known as: ALKA-SELTZER Take 650 mg by mouth every 6 (six) hours as needed (heartburn).   cetirizine 10 MG tablet Commonly known as: ZYRTEC Take 10 mg by mouth at bedtime.   chlorpheniramine-HYDROcodone 10-8 MG/5ML Suer Commonly known as: Tussionex Pennkinetic ER Take 5 mLs by mouth every 8 (eight) hours as needed for cough.   diclofenac Sodium 1 % Gel Commonly known as: VOLTAREN Apply 1 application topically 4 (four) times daily as needed (knee pain).   Imodium A-D 2 MG capsule Generic drug: loperamide Take 4 mg by mouth as needed for diarrhea or loose stools.   Mucinex DM Maximum Strength 60-1200 MG Tb12 Take 1 tablet by mouth at bedtime as needed (congestion/drainage.).   multivitamin with minerals Tabs tablet Take 1 tablet by mouth every evening.   ondansetron 8 MG tablet Commonly known as: Zofran Take 1 tablet (8 mg total) by mouth 2 (two) times daily as needed for refractory nausea / vomiting.   tadalafil 20 MG tablet Commonly known as: CIALIS Take 1 tablet (20 mg total) by mouth daily as needed for erectile dysfunction.   timolol 0.5 % ophthalmic solution Commonly known as: BETIMOL Place 1 drop into the right eye at bedtime.   traMADol 50 MG tablet Commonly known as: ULTRAM Take 1 tablet (50 mg total) by mouth every 6 (six) hours as needed for up to 7 days for moderate pain.   VISINE-A OP Place 1 drop into both eyes daily as needed (allergies).      Follow-up Information    Hubbard Hartshorn, FNP. Call in 1 day(s).   Specialty: Family Medicine Contact information: [address] [place] [address] [phone]  Grace Isaac, MD Follow up.   Specialty: Cardiothoracic Surgery Why: Patient scheduled for 2/4 @ 9, ok per EBG.  Contact information: [place] [address] [phone]

## 2019-02-07 NOTE — Progress Notes (Addendum)
[address]       [place] [address]             [phone]      4 Days Post-Op Procedure(s) (LRB): VIDEO BRONCHOSCOPY (N/A) VIDEO ASSISTED THORACOSCOPY (VATS)/LUNG RESECTION (Left) MINITHORACOTOMY (Left) LEFT UPPER LOBECTOMY WITH LYMPH NODE DISSECTION (Left) INTERCOSTAL NERVE BLOCK WITH EXPAREL (Left) Subjective: Has about 10-15 %pntx on CXR  Objective: Vital signs in last 24 hours: Temp:  [98.3 F (36.8 C)-99.2 F (37.3 C)] 98.7 F (37.1 C) (01/15 0337) Pulse Rate:  [85-110] 102 (01/15 0300) Cardiac Rhythm: Normal sinus rhythm;Sinus tachycardia (01/14 2000) Resp:  [13-20] 20 (01/15 0400) BP: (147-164)/(91-106) 161/91 (01/15 0300) SpO2:  [94 %-97 %] 96 % (01/15 0300)  Hemodynamic parameters for last 24 hours:    Intake/Output from previous day: 01/14 0701 - 01/15 0700 In: 2124.8 [I.V.:2099.8; IV Piggyback:25] Out: 310 [Urine:200; Chest Tube:110] Intake/Output this shift: No intake/output data recorded.  General appearance: alert, cooperative and no distress Heart: regular rate and rhythm Lungs: coarse with scattered ronchi Abdomen: + faint BS, some distension Extremities: no edema or calf tenderness Wound: dressings CDI  Lab Results: Recent Labs    02/06/19 0453 02/07/19 0512  WBC 6.5 6.0  HGB 12.0* 11.2*  HCT 37.9* 35.0*  PLT 319 354   BMET:  Recent Labs    02/05/19 0249 02/07/19 0512  NA 135 138  K 3.8 3.8  CL 102 102  CO2 25 26  GLUCOSE 123* 132*  BUN 7* 19  CREATININE 0.77 0.86  CALCIUM 8.0* 8.5*    PT/INR: No results for input(s): LABPROT, INR in the last 72 hours. ABG    Component Value Date/Time   PHART 7.422 02/04/2019 0345   HCO3 25.0 02/04/2019 0345   TCO2 28 02/03/2019 1557   ACIDBASEDEF 1.0 02/03/2019 1520   O2SAT 97.8 02/04/2019 0345   CBG (last 3)  No results for input(s): GLUCAP in the last 72 hours.  Meds Scheduled Meds: . acetaminophen  1,000 mg Oral Q6H   Or  . acetaminophen (TYLENOL)  oral liquid 160 mg/5 mL  1,000 mg Oral Q6H  . albuterol  2.5 mg Nebulization BID  . bisacodyl  10 mg Oral Daily  . Chlorhexidine Gluconate Cloth  6 each Topical Daily  . enoxaparin (LOVENOX) injection  40 mg Subcutaneous Q24H  . loratadine  10 mg Oral Daily  . magnesium oxide  400 mg Oral BID  . metoCLOPramide (REGLAN) injection  10 mg Intravenous Q8H  . senna-docusate  1 tablet Oral QHS  . timolol  1 drop Right Eye QHS   Continuous Infusions: . sodium chloride    . chlorproMAZINE (THORAZINE) IV 12.5 mg (02/07/19 0006)  . dextrose 5 % and 0.9% NaCl 100 mL/hr at 02/07/19 0500  . famotidine (PEPCID) IV 20 mg (02/06/19 2204)   PRN Meds:.Place/Maintain arterial line **AND** sodium chloride, albuterol, calcium carbonate, chlorproMAZINE (THORAZINE) IV, ketorolac, loperamide, magnesium hydroxide, ondansetron (ZOFRAN) IV, oxyCODONE, traMADol  Xrays DG CHEST PORT 1 VIEW  Result Date: 02/06/2019 CLINICAL DATA:  Follow-up lobectomy EXAM: PORTABLE CHEST 1 VIEW COMPARISON:  02/05/2019 FINDINGS: Two left chest tubes remain in place. No visible pleural air. Power port is unchanged with tip in the right atrium. Right internal jugular tip is at the SVC RA junction. The lungs are well aerated. IMPRESSION: Lungs well aerated.  No pneumothorax. Electronically Signed   By: Nelson Chimes M.D.   On: 02/06/2019 08:25   DG Abd Acute  W/Chest  Result Date: 02/06/2019 CLINICAL DATA:  Recent lung surgery.  Chest tube. EXAM: DG ABDOMEN ACUTE W/ 1V CHEST COMPARISON:  02/06/2019. FINDINGS: Two left chest tubes in stable position. Left PowerPort catheter and right IJ line stable position. No pneumothorax. Low lung volumes with bibasilar atelectasis. Stable cardiomegaly. Lower abdomen is not imaged. Surgical clips right upper quadrant. Gastric distention noted. Multiple distended loops of small bowel are noted. Stool is present in the colon. No free air. Diffuse thoracolumbar spine degenerative change. IMPRESSION: 1. Lines  and tubes including 2 left chest tubes are in stable position. No pneumothorax. Bibasilar atelectasis. 2. Limited abdominal valuation, the lower abdomen was not imaged. Gastric distention. Multiple distended loops of small bowel. Small-bowel obstruction cannot be excluded. Electronically Signed   By: Marcello Moores  Register   On: 02/06/2019 09:53    Assessment/Plan: S/P Procedure(s) (LRB): VIDEO BRONCHOSCOPY (N/A) VIDEO ASSISTED THORACOSCOPY (VATS)/LUNG RESECTION (Left) MINITHORACOTOMY (Left) LEFT UPPER LOBECTOMY WITH LYMPH NODE DISSECTION (Left) INTERCOSTAL NERVE BLOCK WITH EXPAREL (Left)  1 aferile, + HTN, sinus rhythm/tachy 2 sats good on RA 3 H/H down a little- monitor clinically 4 no leukocytosis 5 normal renal fxn 6 10-15 percent apical pntx/space - keep CT's in place in place- will place back to 10 cm suction 7 Ileus may be slowly resolving, faint BS, + gas and stool in colostomy bag this am    LOS: 4 days    [name] PA-C 02/07/2019 Pager [phone]  Unable to place ng tube Abdomen softer this am hiccups improved  Some stool passing to colostomy bag today Ct  Back to low suction I have seen and examined Madox L Holloman and agree with the above assessment  and plan.  Grace Isaac MD [place] [phone] Office [phone] 02/07/2019 8:49 AM

## 2019-02-08 ENCOUNTER — Inpatient Hospital Stay (HOSPITAL_COMMUNITY): Payer: 59

## 2019-02-08 LAB — CBC
HCT: 32.3 % — ABNORMAL LOW (ref 39.0–52.0)
Hemoglobin: 10.2 g/dL — ABNORMAL LOW (ref 13.0–17.0)
MCH: 31 pg (ref 26.0–34.0)
MCHC: 31.6 g/dL (ref 30.0–36.0)
MCV: 98.2 fL (ref 80.0–100.0)
Platelets: 322 10*3/uL (ref 150–400)
RBC: 3.29 MIL/uL — ABNORMAL LOW (ref 4.22–5.81)
RDW: 14.2 % (ref 11.5–15.5)
WBC: 3.9 10*3/uL — ABNORMAL LOW (ref 4.0–10.5)
nRBC: 0 % (ref 0.0–0.2)

## 2019-02-08 LAB — BASIC METABOLIC PANEL
Anion gap: 8 (ref 5–15)
BUN: 21 mg/dL (ref 8–23)
CO2: 29 mmol/L (ref 22–32)
Calcium: 8.3 mg/dL — ABNORMAL LOW (ref 8.9–10.3)
Chloride: 106 mmol/L (ref 98–111)
Creatinine, Ser: 0.82 mg/dL (ref 0.61–1.24)
GFR calc Af Amer: >60 mL/min (ref >60)
GFR calc non Af Amer: >60 mL/min (ref >60)
Glucose, Bld: 107 mg/dL — ABNORMAL HIGH (ref 70–99)
Potassium: 3.8 mmol/L (ref 3.5–5.1)
Sodium: 143 mmol/L (ref 135–145)

## 2019-02-08 LAB — AEROBIC/ANAEROBIC CULTURE W GRAM STAIN (SURGICAL/DEEP WOUND): Culture: NO GROWTH

## 2019-02-08 MED ORDER — ACETAMINOPHEN 500 MG PO TABS
1000.0000 mg | ORAL_TABLET | Freq: Four times a day (QID) | ORAL | Status: AC
  Administered 2019-02-11 – 2019-02-13 (×2): 1000 mg via ORAL
  Filled 2019-02-08 (×4): qty 2

## 2019-02-08 MED ORDER — FENTANYL CITRATE (PF) 100 MCG/2ML IJ SOLN
25.0000 ug | INTRAMUSCULAR | Status: DC | PRN
  Administered 2019-02-09 (×2): 25 ug via INTRAVENOUS
  Filled 2019-02-08 (×2): qty 2

## 2019-02-08 MED ORDER — TRAMADOL HCL 50 MG PO TABS
50.0000 mg | ORAL_TABLET | Freq: Four times a day (QID) | ORAL | Status: DC | PRN
  Administered 2019-02-08 – 2019-02-13 (×3): 50 mg via ORAL
  Filled 2019-02-08 (×3): qty 1

## 2019-02-08 MED ORDER — BUTAMBEN-TETRACAINE-BENZOCAINE 2-2-14 % EX AERO
1.0000 | INHALATION_SPRAY | Freq: Once | CUTANEOUS | Status: DC
  Filled 2019-02-08: qty 20

## 2019-02-08 MED ORDER — ACETAMINOPHEN 160 MG/5ML PO SOLN
1000.0000 mg | Freq: Four times a day (QID) | ORAL | Status: AC
  Filled 2019-02-08: qty 40.6

## 2019-02-08 NOTE — Progress Notes (Signed)
Ng not draining well , irrigated , with vomiting tube almost out  Replaced with 16 fr ng, 800 ml removed Keep to suction  Abdomen less distended , not tender on exam, patient denies pain   Grace Isaac MD      [address] [place] [address] Office [phone]

## 2019-02-08 NOTE — Progress Notes (Signed)
Pt's chest tube output 70 cc in AM shift total, pt is alert and oriented, covered with pain and nausea medicines throughout the day, output recorded via NG, and colostomy, wife is in bed side this time, pt is refusing to get out of the bed this time even nurse offered him to sit on the recliner and walk, he said he is not ready and he will let us know when he is ready to walk. Minimal hiccup, thorazine IV provided, will continue to monitor the patient  [name], RN

## 2019-02-08 NOTE — Progress Notes (Signed)
This evening pt complained abdominal distention, talked with Dr Servando Snare over phone and N-G tube pulled 4 cm to see if it makes any difference per MD verbal order, pt's NG output is 50 cc so far since am, next abdominal X-ray scheduled for tomorrow, pt has a nausea , medicine will be given at due time, will continue to monitor  [name], RN

## 2019-02-08 NOTE — Plan of Care (Signed)

## 2019-02-08 NOTE — Progress Notes (Signed)
Pt has vomited almost 600 cc , old NG tube taken off, New one will be placed, waiting for the Cetacaine spray from pharmacy.  [name], RN

## 2019-02-08 NOTE — Progress Notes (Addendum)
Advanced NG tube 10cm further in left nostril.  02/09/19-0645- TCTS potassium protocol ordered for kcl level 3.5 this am.

## 2019-02-08 NOTE — Progress Notes (Signed)
Patient ID: JESSICA SEIDMAN, male   DOB: 16-Jan-1956, 64 y.o.   MRN: [id number] TCTS DAILY ICU PROGRESS NOTE                   [address]            [place] [address]          [phone]   5 Days Post-Op Procedure(s) (LRB): VIDEO BRONCHOSCOPY (N/A) VIDEO ASSISTED THORACOSCOPY (VATS)/LUNG RESECTION (Left) MINITHORACOTOMY (Left) LEFT UPPER LOBECTOMY WITH LYMPH NODE DISSECTION (Left) INTERCOSTAL NERVE BLOCK WITH EXPAREL (Left)  Total Length of Stay:  LOS: 5 days   Subjective: Patient feels better this am, even with ng tube, little drainage from NG, increased output from colostomy   Objective: Vital signs in last 24 hours: Temp:  [97.5 F (36.4 C)-98.5 F (36.9 C)] 97.5 F (36.4 C) (01/16 0751) Pulse Rate:  [80-105] 89 (01/16 0800) Cardiac Rhythm: Normal sinus rhythm (01/16 0705) Resp:  [12-29] 16 (01/16 0800) BP: (136-179)/(84-91) 146/85 (01/16 0800) SpO2:  [92 %-100 %] 97 % (01/16 0800)  Filed Weights   02/03/19 0618  Weight: 122.6 kg    Weight change:    Hemodynamic parameters for last 24 hours:    Intake/Output from previous day: 01/15 0701 - 01/16 0700 In: 2009.6 [I.V.:1884.6; IV Piggyback:125] Out: 2320 [Urine:300; Emesis/NG output:1750; Chest Tube:270]  Intake/Output this shift: Total I/O In: -  Out: 30 [Chest Tube:30]  Current Meds: Scheduled Meds: . acetaminophen  1,000 mg Oral Q6H   Or  . acetaminophen (TYLENOL) oral liquid 160 mg/5 mL  1,000 mg Oral Q6H  . albuterol  2.5 mg Nebulization BID  . bisacodyl  10 mg Oral Daily  . Chlorhexidine Gluconate Cloth  6 each Topical Daily  . enoxaparin (LOVENOX) injection  40 mg Subcutaneous Q24H  . loratadine  10 mg Oral Daily  . metoCLOPramide (REGLAN) injection  10 mg Intravenous Q8H  . senna-docusate  1 tablet Oral QHS  . timolol  1 drop Right Eye QHS   Continuous Infusions: . sodium chloride    . chlorproMAZINE (THORAZINE) IV 12.5 mg (02/07/19 2022)  . dextrose 5 % and 0.9% NaCl 100  mL/hr at 02/08/19 0748  . famotidine (PEPCID) IV 20 mg (02/07/19 2206)   PRN Meds:.Place/Maintain arterial line **AND** sodium chloride, albuterol, calcium carbonate, chlorproMAZINE (THORAZINE) IV, hydrALAZINE, ketorolac, ondansetron (ZOFRAN) IV  General appearance: alert, cooperative and no distress Neurologic: intact Heart: regular rate and rhythm, S1, S2 normal, no murmur, click, rub or gallop Lungs: diminished breath sounds bibasilar Abdomen: abdomen softer/ less distended , hypoactive bowel sounds  Extremities: extremities normal, atraumatic, no cyanosis or edema Wound: air leak with cough  Lab Results: CBC: Recent Labs    02/07/19 0512 02/08/19 0406  WBC 6.0 3.9*  HGB 11.2* 10.2*  HCT 35.0* 32.3*  PLT 354 322   BMET:  Recent Labs    02/07/19 0512 02/08/19 0406  NA 138 143  K 3.8 3.8  CL 102 106  CO2 26 29  GLUCOSE 132* 107*  BUN 19 21  CREATININE 0.86 0.82  CALCIUM 8.5* 8.3*    CMET: Lab Results  Component Value Date   WBC 3.9 (L) 02/08/2019   HGB 10.2 (L) 02/08/2019   HCT 32.3 (L) 02/08/2019   PLT 322 02/08/2019   GLUCOSE 107 (H) 02/08/2019   CHOL 147 03/22/2018   TRIG 126 03/22/2018   HDL 35 (L) 03/22/2018   LDLCALC 89 03/22/2018   ALT 31 02/07/2019  AST 33 02/07/2019   NA 143 02/08/2019   K 3.8 02/08/2019   CL 106 02/08/2019   CREATININE 0.82 02/08/2019   BUN 21 02/08/2019   CO2 29 02/08/2019   TSH 3.96 07/03/2016   PSA 1.7 07/03/2016   INR 0.9 01/30/2019      PT/INR: No results for input(s): LABPROT, INR in the last 72 hours. Radiology: DG Abd 1 View  DG Chest Port 1 View In am  Result Date: 02/08/2019 CLINICAL DATA:  Post resection of LEFT upper lobe mass. EXAM: PORTABLE CHEST 1 VIEW COMPARISON:  Radiograph 02/07/2019 FINDINGS: Port in the anterior chest wall with tip in distal SVC. NG tube extends the stomach. Two LEFT chest tubes in place without appreciable pneumothorax. RIGHT lung clear. Central venous line unchanged. IMPRESSION:  1. Two LEFT chest tubes in place.  No appreciable pneumothorax. 2. NG tube extend into the stomach. Electronically Signed   By: Suzy Bouchard M.D.   On: 02/08/2019 10:08   DG Abd Portable 1V  Result Date: 02/08/2019 CLINICAL DATA:  64 year old male with history of ileus. EXAM: PORTABLE ABDOMEN - 1 VIEW COMPARISON:  Chest x-ray 02/07/2019. FINDINGS: Nasogastric tube tip in the antral pre-pyloric region of the stomach. Diffuse dilatation of the small bowel measuring up to 5.7 cm in diameter. Patchy gas and stool noted throughout the colon, which appears decompressed. No rectal gas. No gross evidence of pneumoperitoneum noted on this single supine view. Surgical clips project over the right upper quadrant of the abdomen, likely from prior cholecystectomy. IMPRESSION: 1. Persistent small bowel dilatation with decompressed colon, concerning for partial small bowel obstruction. 2. Support apparatus and postoperative changes, as above. Electronically Signed   By: Vinnie Langton M.D.   On: 02/08/2019 10:12     Assessment/Plan: S/P Procedure(s) (LRB): VIDEO BRONCHOSCOPY (N/A) VIDEO ASSISTED THORACOSCOPY (VATS)/LUNG RESECTION (Left) MINITHORACOTOMY (Left) LEFT UPPER LOBECTOMY WITH LYMPH NODE DISSECTION (Left) INTERCOSTAL NERVE BLOCK WITH EXPAREL (Left) Mobilize Resolving ileus/partial small bowel obstruction Leave chest tube in place Leave ng for now  Electrolytes stable considering ng and vomiting     Grace Isaac 02/08/2019 10:19 AM

## 2019-02-09 ENCOUNTER — Inpatient Hospital Stay (HOSPITAL_COMMUNITY): Payer: 59

## 2019-02-09 LAB — CBC
HCT: 32.8 % — ABNORMAL LOW (ref 39.0–52.0)
Hemoglobin: 10.2 g/dL — ABNORMAL LOW (ref 13.0–17.0)
MCH: 30.4 pg (ref 26.0–34.0)
MCHC: 31.1 g/dL (ref 30.0–36.0)
MCV: 97.6 fL (ref 80.0–100.0)
Platelets: 340 10*3/uL (ref 150–400)
RBC: 3.36 MIL/uL — ABNORMAL LOW (ref 4.22–5.81)
RDW: 13.8 % (ref 11.5–15.5)
WBC: 3.4 10*3/uL — ABNORMAL LOW (ref 4.0–10.5)
nRBC: 0 % (ref 0.0–0.2)

## 2019-02-09 LAB — BASIC METABOLIC PANEL
Anion gap: 10 (ref 5–15)
BUN: 16 mg/dL (ref 8–23)
CO2: 28 mmol/L (ref 22–32)
Calcium: 8.2 mg/dL — ABNORMAL LOW (ref 8.9–10.3)
Chloride: 104 mmol/L (ref 98–111)
Creatinine, Ser: 0.73 mg/dL (ref 0.61–1.24)
GFR calc Af Amer: >60 mL/min (ref >60)
GFR calc non Af Amer: >60 mL/min (ref >60)
Glucose, Bld: 106 mg/dL — ABNORMAL HIGH (ref 70–99)
Potassium: 3.5 mmol/L (ref 3.5–5.1)
Sodium: 142 mmol/L (ref 135–145)

## 2019-02-09 MED ORDER — POTASSIUM CHLORIDE 10 MEQ/50ML IV SOLN
10.0000 meq | Freq: Once | INTRAVENOUS | Status: AC
  Administered 2019-02-09: 10 meq via INTRAVENOUS

## 2019-02-09 MED ORDER — POTASSIUM CHLORIDE 10 MEQ/50ML IV SOLN
10.0000 meq | INTRAVENOUS | Status: AC
  Administered 2019-02-09 (×3): 10 meq via INTRAVENOUS
  Filled 2019-02-09 (×2): qty 50

## 2019-02-09 MED ORDER — DIATRIZOATE MEGLUMINE & SODIUM 66-10 % PO SOLN
90.0000 mL | Freq: Once | ORAL | Status: AC
  Administered 2019-02-09: 90 mL via NASOGASTRIC
  Filled 2019-02-09: qty 90

## 2019-02-09 NOTE — Progress Notes (Signed)
Pt transferred to the chair from bed with 1 person assist, pt tolerated very well. NG tube drainage is 650 cc so far from this morning, pt tolerated gastrograffin well and scheduled for X-ray at 8pm, Chest tube site is CDI, dressing changed, output recorded, wife is in bed side and is updated, will continue to monitor  [name], RN

## 2019-02-09 NOTE — Consult Note (Addendum)
[facility] Surgery Consult Note  [name] [name] 30-Sep-1955  [id number].    Requesting MD: Servando Snare Chief Complaint/Reason for Consult: sbo vs ileus   HPI:   Patient is a 64 year old male who presented to [facility] for left upper lobe lobectomy for lung mass. Patient has PMH significant for rectal cancer in 2015 s/p robot assisted resection with end colostomy and s/p adjuvant chemo/radiation. Patient was worked up for elevating CEA and cough and found to have left upper lobe mass and referred to Dr. Servando Snare. Admitted for surgery.  Post-operatively patient developed abdominal distention, nausea and decreased output from colostomy. Abdominal films were suggestive of sbo vs ileus. NGT was inserted 1/16. Patient reports no abdominal pain this AM and feels that distention is improved since NGT placement. He has had some output from colostomy but not much. He reports nausea is improved currently. Other past abdominal surgery includes laparoscopic cholecystectomy.   He is allergic to sulfa and morphine. Occasional tobacco and alcohol use, denies illicit drug use.   ROS: Review of Systems  Constitutional: Negative for chills and fever.  Respiratory: Negative for shortness of breath.   Cardiovascular: Positive for chest pain.  Gastrointestinal: Positive for constipation, nausea and vomiting. Negative for abdominal pain and diarrhea.       Distention  Genitourinary: Negative for dysuria, frequency and urgency.  All other systems reviewed and are negative.   Family History  Problem Relation Age of Onset  . Cancer Mother   . Hypertension Mother   . COPD Mother   . COPD Father   . Cancer Father        Prostate CA, on Lupron  . Stroke Maternal Grandmother   . Cancer Paternal Grandmother   . Heart attack Paternal Grandfather   . Cancer Paternal Grandfather     Past Medical History:  Diagnosis Date  . Anemia   . Cancer [facility])    rectal 2015, followed by Oncology,  permanent colostomy  . Colostomy in place [facility])   . Dyspnea    on exertion  . History of kidney stones   . Pneumonia   . Seasonal allergies     Past Surgical History:  Procedure Laterality Date  . BACK SURGERY    . CHOLECYSTECTOMY    . COLON SURGERY    . ENDOBRONCHIAL ULTRASOUND N/A 07/01/2018   Procedure: ENDOBRONCHIAL ULTRASOUND;  Surgeon: Tyler Pita, MD;  Location: [facility] ORS;  Service: Cardiopulmonary;  Laterality: N/A;  . FLEXIBLE BRONCHOSCOPY Left 11/20/2018   Procedure: FLEXIBLE BRONCHOSCOPY;  Surgeon: Tyler Pita, MD;  Location: [facility] ORS;  Service: Cardiopulmonary;  Laterality: Left;  . INTERCOSTAL NERVE BLOCK Left 02/03/2019   Procedure: INTERCOSTAL NERVE BLOCK WITH EXPAREL;  Surgeon: Grace Isaac, MD;  Location: [place];  Service: Thoracic;  Laterality: Left;  . KNEE SURGERY Bilateral   . LOBECTOMY Left 02/03/2019   Procedure: LEFT UPPER LOBECTOMY WITH LYMPH NODE DISSECTION;  Surgeon: Grace Isaac, MD;  Location: [place];  Service: Thoracic;  Laterality: Left;  . PORT A CATH REVISION Left 08/05/2018   Procedure: PORT A CATH REVISION, REPOSITION LEFT;  Surgeon: Robert Bellow, MD;  Location: [facility] ORS;  Service: General;  Laterality: Left;  . PORT-A-CATH REMOVAL Right 12/02/2014   Procedure: REMOVAL PORT-A-CATH;  Surgeon: Marlyce Huge, MD;  Location: [facility] OR[facility];  Service: General;  Laterality: Right;  . PORTACATH PLACEMENT    . PORTACATH PLACEMENT Left 07/29/2018   Procedure: INSERTION PORT-A-CATH LEFT;  Surgeon: Bary Castilla,  Forest Gleason, MD;  Location: [facility] ORS;  Service: General;  Laterality: Left;  . THORACOTOMY/LOBECTOMY Left 02/03/2019   Procedure: MINITHORACOTOMY;  Surgeon: Grace Isaac, MD;  Location: [place];  Service: Thoracic;  Laterality: Left;  [name] VIDEO ASSISTED THORACOSCOPY (VATS)/WEDGE RESECTION Left 02/03/2019   Procedure: VIDEO ASSISTED THORACOSCOPY (VATS)/LUNG RESECTION;  Surgeon: Grace Isaac, MD;  Location: [place];  Service:  Thoracic;  Laterality: Left;  [name] VIDEO BRONCHOSCOPY N/A 02/03/2019   Procedure: VIDEO BRONCHOSCOPY;  Surgeon: Grace Isaac, MD;  Location: [place];  Service: Thoracic;  Laterality: N/A;  . VIDEO BRONCHOSCOPY WITH ENDOBRONCHIAL ULTRASOUND N/A 12/30/2018   Procedure: VIDEO BRONCHOSCOPY WITH ENDOBRONCHIAL ULTRASOUND;  Surgeon: Grace Isaac, MD;  Location: [place];  Service: Thoracic;  Laterality: N/A;    Social History:  reports that he has never smoked. His smokeless tobacco use includes chew. He reports current alcohol use. He reports that he does not use drugs.  Allergies:  Allergies  Allergen Reactions  . Sulfa Antibiotics Hives  . Morphine And Related Other (See Comments)    Altered mental status    Medications Prior to Admission  Medication Sig Dispense Refill  . acetaminophen (TYLENOL) 650 MG CR tablet Take 1,300 mg by mouth every 8 (eight) hours as needed for pain.    [name] albuterol (VENTOLIN HFA) 108 (90 Base) MCG/ACT inhaler Inhale 2 puffs into the lungs every 6 (six) hours as needed for wheezing or shortness of breath. 18 g 1  . aspirin-acetaminophen-caffeine (EXCEDRIN MIGRAINE) 407-680-88 MG tablet Take 2 tablets by mouth every 6 (six) hours as needed for headache.    [name] aspirin-sod bicarb-citric acid (ALKA-SELTZER) 325 MG TBEF tablet Take 650 mg by mouth every 6 (six) hours as needed (heartburn).    . cetirizine (ZYRTEC) 10 MG tablet Take 10 mg by mouth at bedtime.     [name] Dextromethorphan-guaiFENesin (MUCINEX DM MAXIMUM STRENGTH) 60-1200 MG TB12 Take 1 tablet by mouth at bedtime as needed (congestion/drainage.).    [name] diclofenac Sodium (VOLTAREN) 1 % GEL Apply 1 application topically 4 (four) times daily as needed (knee pain).    [name] loperamide (IMODIUM A-D) 2 MG capsule Take 4 mg by mouth as needed for diarrhea or loose stools.     . Multiple Vitamin (MULTIVITAMIN WITH MINERALS) TABS tablet Take 1 tablet by mouth every evening.     [name] Naphazoline-Pheniramine (VISINE-A OP) Place 1  drop into both eyes daily as needed (allergies).    . ondansetron (ZOFRAN) 8 MG tablet Take 1 tablet (8 mg total) by mouth 2 (two) times daily as needed for refractory nausea / vomiting. 30 tablet 2  . Oxymetazoline HCl (AFRIN EXTRA MOISTURIZING NA) Place 1 spray into the nose 2 (two) times daily as needed (congestion.).    [name] tadalafil (ADCIRCA/CIALIS) 20 MG tablet Take 1 tablet (20 mg total) by mouth daily as needed for erectile dysfunction. 6 tablet 10  . timolol (BETIMOL) 0.5 % ophthalmic solution Place 1 drop into the right eye at bedtime.     . chlorpheniramine-HYDROcodone (TUSSIONEX PENNKINETIC ER) 10-8 MG/5ML SUER Take 5 mLs by mouth every 8 (eight) hours as needed for cough. 115 mL 0    Blood pressure (!) 147/85, pulse 80, temperature 98 F (36.7 C), temperature source Oral, resp. rate 18, height _0  (1.905 m), weight 122.6 kg, SpO2 99 %. Physical Exam: Physical Exam Constitutional:      General: He is not in acute distress.    Appearance: Normal appearance. He is  not toxic-appearing.  HENT:     Head: Normocephalic and atraumatic.     Right Ear: External ear normal.     Left Ear: External ear normal.     Nose: Nose normal.     Mouth/Throat:     Mouth: Mucous membranes are dry.  Eyes:     Extraocular Movements: Extraocular movements intact.     Conjunctiva/sclera: Conjunctivae normal.     Pupils: Pupils are equal, round, and reactive to light.  Cardiovascular:     Rate and Rhythm: Normal rate and regular rhythm.     Pulses: Normal pulses.  Pulmonary:     Effort: Pulmonary effort is normal.     Breath sounds: Examination of the left-upper field reveals decreased breath sounds. Decreased breath sounds present.     Comments: Left chest tube Abdominal:     General: Bowel sounds are decreased. There is distension (mild to moderate).     Palpations: Abdomen is soft.     Tenderness: There is no abdominal tenderness.     Hernia: No hernia is present.     Comments: Colostomy  present in LLQ with some soft brown stool present in bag  Musculoskeletal:     Cervical back: Normal range of motion and neck supple.     Comments: ROM grossly intact in bilateral upper and lower extremities  Skin:    General: Skin is warm and dry.  Neurological:     Mental Status: He is alert and oriented to person, place, and time.  Psychiatric:        Attention and Perception: Attention normal.        Mood and Affect: Mood normal.        Speech: Speech normal.        Behavior: Behavior normal. Behavior is cooperative.     Results for orders placed or performed during the hospital encounter of 02/03/19 (from the past 48 hour(s))  Basic metabolic panel in a.m.     Status: Abnormal   Collection Time: 02/08/19  4:06 AM  Result Value Ref Range   Sodium 143 135 - 145 mmol/L   Potassium 3.8 3.5 - 5.1 mmol/L   Chloride 106 98 - 111 mmol/L   CO2 29 22 - 32 mmol/L   Glucose, Bld 107 (H) 70 - 99 mg/dL   BUN 21 8 - 23 mg/dL   Creatinine, Ser 0.82 0.61 - 1.24 mg/dL   Calcium 8.3 (L) 8.9 - 10.3 mg/dL   GFR calc non Af Amer >60 >60 mL/min   GFR calc Af Amer >60 >60 mL/min   Anion gap 8 5 - 15    Comment: Performed at [facility], [place] [address] [place], [place] [address]  CBC in am     Status: Abnormal   Collection Time: 02/08/19  4:06 AM  Result Value Ref Range   WBC 3.9 (L) 4.0 - 10.5 K/uL   RBC 3.29 (L) 4.22 - 5.81 MIL/uL   Hemoglobin 10.2 (L) 13.0 - 17.0 g/dL   HCT 32.3 (L) 39.0 - 52.0 %   MCV 98.2 80.0 - 100.0 fL   MCH 31.0 26.0 - 34.0 pg   MCHC 31.6 30.0 - 36.0 g/dL   RDW 14.2 11.5 - 15.5 %   Platelets 322 150 - 400 K/uL   nRBC 0.0 0.0 - 0.2 %    Comment: Performed at [facility], [place] [address], [place], [place] [address]  Basic metabolic panel in a.m.  Status: Abnormal   Collection Time: 02/09/19  5:08 AM  Result Value Ref Range   Sodium 142 135 - 145 mmol/L   Potassium 3.5 3.5 - 5.1 mmol/L   Chloride 104 98 - 111 mmol/L   CO2 28 22 - 32 mmol/L    Glucose, Bld 106 (H) 70 - 99 mg/dL   BUN 16 8 - 23 mg/dL   Creatinine, Ser 0.73 0.61 - 1.24 mg/dL   Calcium 8.2 (L) 8.9 - 10.3 mg/dL   GFR calc non Af Amer >60 >60 mL/min   GFR calc Af Amer >60 >60 mL/min   Anion gap 10 5 - 15    Comment: Performed at [place] [address], [place], [place] [address]  CBC in am     Status: Abnormal   Collection Time: 02/09/19  5:08 AM  Result Value Ref Range   WBC 3.4 (L) 4.0 - 10.5 K/uL   RBC 3.36 (L) 4.22 - 5.81 MIL/uL   Hemoglobin 10.2 (L) 13.0 - 17.0 g/dL   HCT 32.8 (L) 39.0 - 52.0 %   MCV 97.6 80.0 - 100.0 fL   MCH 30.4 26.0 - 34.0 pg   MCHC 31.1 30.0 - 36.0 g/dL   RDW 13.8 11.5 - 15.5 %   Platelets 340 150 - 400 K/uL   nRBC 0.0 0.0 - 0.2 %    Comment: Performed at [facility], [place] [address] [place], [place] [address]   DG Abd 1 View  Result Date: 02/07/2019 CLINICAL DATA:  NG tube placement, ileus EXAM: ABDOMEN - 1 VIEW COMPARISON:  02/06/2019 FINDINGS: NG tube is in place with the tip in the descending duodenum. Dilated small bowel loops noted concerning for small bowel obstruction. Large bowel is decompressed IMPRESSION: NG tube tip is in the distal descending duodenum. Small bowel dilatation with decompressed large bowel. Appearance is concerning for small bowel obstruction. Electronically Signed   By: Rolm Baptise M.D.   On: 02/07/2019 17:48   DG Chest Port 1 View In am  Result Date: 02/09/2019 CLINICAL DATA:  Status post video-assisted thoracic surgery. EXAM: PORTABLE CHEST 1 VIEW COMPARISON:  February 08, 2019. FINDINGS: The heart size and mediastinal contours are within normal limits. Nasogastric tube tip is seen in proximal stomach. Two left-sided chest tubes are noted without pneumothorax. Left subclavian Port-A-Cath is unchanged. Right internal jugular catheter is unchanged. Right lung is clear. Mildly displaced left rib fracture is noted. IMPRESSION: Two left-sided chest tubes are noted without pneumothorax.  Nasogastric tube tip seen in proximal stomach. Electronically Signed   By: Marijo Conception M.D.   On: 02/09/2019 09:44   DG Chest Port 1 View In am  Result Date: 02/08/2019 CLINICAL DATA:  Post resection of LEFT upper lobe mass. EXAM: PORTABLE CHEST 1 VIEW COMPARISON:  Radiograph 02/07/2019 FINDINGS: Port in the anterior chest wall with tip in distal SVC. NG tube extends the stomach. Two LEFT chest tubes in place without appreciable pneumothorax. RIGHT lung clear. Central venous line unchanged. IMPRESSION: 1. Two LEFT chest tubes in place.  No appreciable pneumothorax. 2. NG tube extend into the stomach. Electronically Signed   By: Suzy Bouchard M.D.   On: 02/08/2019 10:08   DG Abd Portable 1V  Result Date: 02/09/2019 CLINICAL DATA:  Abdominal distension and pain. EXAM: PORTABLE ABDOMEN - 1 VIEW COMPARISON:  February 08, 2019. FINDINGS: Mildly dilated small bowel loops are noted concerning for ileus or distal small bowel obstruction. Phleboliths are noted in  the pelvis. No colonic dilatation is noted. IMPRESSION: Mildly dilated small bowel loops are noted concerning for ileus or distal small bowel obstruction. Electronically Signed   By: Marijo Conception M.D.   On: 02/09/2019 09:45   DG Abd Portable 1V  Result Date: 02/08/2019 CLINICAL DATA:  Small-bowel obstruction, nasogastric tube placement EXAM: PORTABLE ABDOMEN - 1 VIEW COMPARISON:  Same-day radiograph FINDINGS: A nasogastric tube distal tip is seen within the stomach just beyond the level of the GE junction. Stomach is air-filled. Dilated loops of small bowel are again seen throughout the abdomen measuring up to 5.5 cm in diameter, which is similar in caliber to the prior study. Findings compatible small bowel obstruction. IMPRESSION: 1. Nasogastric tube distal tip within the stomach just beyond the level of the GE junction. Recommend advancement approximately 10 cm. 2. Findings suggestive of small bowel obstruction. Electronically Signed   By:  Davina Poke D.O.   On: 02/08/2019 19:48   DG Abd Portable 1V  Result Date: 02/08/2019 CLINICAL DATA:  64 year old male with history of ileus. EXAM: PORTABLE ABDOMEN - 1 VIEW COMPARISON:  Chest x-ray 02/07/2019. FINDINGS: Nasogastric tube tip in the antral pre-pyloric region of the stomach. Diffuse dilatation of the small bowel measuring up to 5.7 cm in diameter. Patchy gas and stool noted throughout the colon, which appears decompressed. No rectal gas. No gross evidence of pneumoperitoneum noted on this single supine view. Surgical clips project over the right upper quadrant of the abdomen, likely from prior cholecystectomy. IMPRESSION: 1. Persistent small bowel dilatation with decompressed colon, concerning for partial small bowel obstruction. 2. Support apparatus and postoperative changes, as above. Electronically Signed   By: Vinnie Langton M.D.   On: 02/08/2019 10:12      Assessment/Plan Hx of rectal cancer s/p resection and colostomy 5 years ago,  chemo/radiation S/p Left upper lobectomy for metastatic lung mass 02/03/19 Dr. Servando Snare  SBO vs ileus - abdominal film this AM with mildly dilated small bowel loops - patient with no pain, and reports distention feels better since NGT placement, has had small amount output from colostomy - NGT with 2650 cc of bilious drainage - will order small bowel protocol - mobilize as able and clamp NGT for mobilization   Brigid Re, [facility] Surgery 02/09/2019, 10:28 AM  Agree with above. Already feels better with NGT.  Did not feel like getting out of bed yesterday - but thinks he can get to a chair today.  He has passed some flatus through his colosotmy. Plan SBO protocol.  Alphonsa Overall, MD, [facility] Surgery Office phone:  [phone]

## 2019-02-09 NOTE — Progress Notes (Addendum)
      [address]       [place] [address]             [phone]      6 Days Post-Op Procedure(s) (LRB): VIDEO BRONCHOSCOPY (N/A) VIDEO ASSISTED THORACOSCOPY (VATS)/LUNG RESECTION (Left) MINITHORACOTOMY (Left) LEFT UPPER LOBECTOMY WITH LYMPH NODE DISSECTION (Left) INTERCOSTAL NERVE BLOCK WITH EXPAREL (Left) Subjective: Feels better once NG tube was placed back in.   Objective: Vital signs in last 24 hours: Temp:  [97.7 F (36.5 C)-98.7 F (37.1 C)] 98 F (36.7 C) (01/17 0748) Pulse Rate:  [75-101] 80 (01/17 0748) Cardiac Rhythm: Normal sinus rhythm (01/17 0704) Resp:  [17-25] 18 (01/17 0447) BP: (144-160)/(84-91) 147/85 (01/17 0748) SpO2:  [97 %-100 %] 99 % (01/17 0800)     Intake/Output from previous day: 01/16 0701 - 01/17 0700 In: 3241.7 [P.O.:840; I.V.:2176.7; IV Piggyback:225] Out: 3662 [Urine:1850; Emesis/NG output:3400; Stool:150; Chest Tube:130] Intake/Output this shift: Total I/O In: 100 [P.O.:100] Out: 200 [Urine:200]  General appearance: alert, cooperative and no distress Heart: regular rate and rhythm, S1, S2 normal, no murmur, click, rub or gallop Lungs: clear to auscultation bilaterally and bilateral expriatory wheezing Abdomen: decreased bowel sounds Extremities: extremities normal, atraumatic, no cyanosis or edema Wound: clean and dry  Lab Results: Recent Labs    02/08/19 0406 02/09/19 0508  WBC 3.9* 3.4*  HGB 10.2* 10.2*  HCT 32.3* 32.8*  PLT 322 340   BMET:  Recent Labs    02/08/19 0406 02/09/19 0508  NA 143 142  K 3.8 3.5  CL 106 104  CO2 29 28  GLUCOSE 107* 106*  BUN 21 16  CREATININE 0.82 0.73  CALCIUM 8.3* 8.2*    PT/INR: No results for input(s): LABPROT, INR in the last 72 hours. ABG    Component Value Date/Time   PHART 7.422 02/04/2019 0345   HCO3 25.0 02/04/2019 0345   TCO2 28 02/03/2019 1557   ACIDBASEDEF 1.0 02/03/2019 1520   O2SAT 97.8 02/04/2019 0345   CBG (last 3)  No results for  input(s): GLUCAP in the last 72 hours.  Assessment/Plan: S/P Procedure(s) (LRB): VIDEO BRONCHOSCOPY (N/A) VIDEO ASSISTED THORACOSCOPY (VATS)/LUNG RESECTION (Left) MINITHORACOTOMY (Left) LEFT UPPER LOBECTOMY WITH LYMPH NODE DISSECTION (Left) INTERCOSTAL NERVE BLOCK WITH EXPAREL (Left)  1. Hemodynamically stable. BP slightly elevated but likely due to pain. On hydralazine PRN.  2. Pulm-CT remains with intermittent air leak at baseline. 2+ air leak with cough. CXR has been stable.  Tolerating 2L [place] with excellent oxygenation. Continue IS 3. Renal-creatinine stable 0.73 today and electrolytes okay 4. GI- abdominal distension last night with vomiting. NG tube replace and on suction. Patient feels better today. Over 3L of NG output.  5. Debility- Has not walked at all. OOB to chair. Try to mobilize more today but still copious output from NG. 6. Patient is requesting popsicle. Dr. Servando Snare says it is okay.   Plan: Continue current medical management.    LOS: 6 days    [name] [name] 02/09/2019  Asked GS to see and assist with care of SBO, large ng output Feels better this am, but still large ng output  I have seen and examined [name] [name] and agree with the above assessment  and plan.  [name] Isaac MD Beeper [phone] Office [phone] 02/09/2019 12:02 PM

## 2019-02-10 ENCOUNTER — Inpatient Hospital Stay (HOSPITAL_COMMUNITY): Payer: 59

## 2019-02-10 LAB — BASIC METABOLIC PANEL
Anion gap: 7 (ref 5–15)
BUN: 15 mg/dL (ref 8–23)
CO2: 25 mmol/L (ref 22–32)
Calcium: 8.1 mg/dL — ABNORMAL LOW (ref 8.9–10.3)
Chloride: 108 mmol/L (ref 98–111)
Creatinine, Ser: 0.75 mg/dL (ref 0.61–1.24)
GFR calc Af Amer: >60 mL/min (ref >60)
GFR calc non Af Amer: >60 mL/min (ref >60)
Glucose, Bld: 114 mg/dL — ABNORMAL HIGH (ref 70–99)
Potassium: 3.3 mmol/L — ABNORMAL LOW (ref 3.5–5.1)
Sodium: 140 mmol/L (ref 135–145)

## 2019-02-10 MED ORDER — POTASSIUM CHLORIDE 10 MEQ/100ML IV SOLN
10.0000 meq | INTRAVENOUS | Status: AC
  Administered 2019-02-10 (×4): 10 meq via INTRAVENOUS
  Filled 2019-02-10 (×4): qty 100

## 2019-02-10 NOTE — Progress Notes (Addendum)
[name] [name]       [place], [address]             [phone]      7 Days Post-Op Procedure(s) (LRB): VIDEO BRONCHOSCOPY (N/A) VIDEO ASSISTED THORACOSCOPY (VATS)/LUNG RESECTION (Left) MINITHORACOTOMY (Left) LEFT UPPER LOBECTOMY WITH LYMPH NODE DISSECTION (Left) INTERCOSTAL NERVE BLOCK WITH EXPAREL (Left) Subjective: Stool in colostomy bag, has emptied 3 times since yesterday, belly feels better  Objective: Vital signs in last 24 hours: Temp:  [97.8 F (36.6 C)-98.3 F (36.8 C)] 98.3 F (36.8 C) (01/18 0202) Pulse Rate:  [80-94] 91 (01/18 0202) Cardiac Rhythm: Normal sinus rhythm (01/17 2000) Resp:  [14-18] 17 (01/18 0202) BP: (144-167)/(83-98) 163/89 (01/18 0202) SpO2:  [95 %-99 %] 97 % (01/18 0202)  Hemodynamic parameters for last 24 hours:    Intake/Output from previous day: 01/17 0701 - 01/18 0700 In: 1621.8 [P.O.:300; I.V.:1282.8; IV Piggyback:39] Out: 2390 [Urine:1250; Emesis/NG output:650; Stool:400; Chest Tube:90] Intake/Output this shift: No intake/output data recorded.  General appearance: alert, cooperative and no distress Heart: regular rate and rhythm Lungs: coarse with scattered insp and exp wheeze Abdomen: less distended, nontender, scant BS Extremities: no edema or calf tenderness Wound: dressing in place  Lab Results: Recent Labs    02/08/19 0406 02/09/19 0508  WBC 3.9* 3.4*  HGB 10.2* 10.2*  HCT 32.3* 32.8*  PLT 322 340   BMET:  Recent Labs    02/08/19 0406 02/09/19 0508  NA 143 142  K 3.8 3.5  CL 106 104  CO2 29 28  GLUCOSE 107* 106*  BUN 21 16  CREATININE 0.82 0.73  CALCIUM 8.3* 8.2*    PT/INR: No results for input(s): LABPROT, INR in the last 72 hours. ABG    Component Value Date/Time   PHART 7.422 02/04/2019 0345   HCO3 25.0 02/04/2019 0345   TCO2 28 02/03/2019 1557   ACIDBASEDEF 1.0 02/03/2019 1520   O2SAT 97.8 02/04/2019 0345   CBG (last 3)  No results for input(s): GLUCAP in the last 72  hours.  Meds Scheduled Meds: . acetaminophen  1,000 mg Oral Q6H   Or  . acetaminophen (TYLENOL) oral liquid 160 mg/5 mL  1,000 mg Oral Q6H  . albuterol  2.5 mg Nebulization BID  . butamben-tetracaine-benzocaine  1 spray Topical Once  . Chlorhexidine Gluconate Cloth  6 each Topical Daily  . enoxaparin (LOVENOX) injection  40 mg Subcutaneous Q24H  . metoCLOPramide (REGLAN) injection  10 mg Intravenous Q8H  . timolol  1 drop Right Eye QHS   Continuous Infusions: . sodium chloride    . chlorproMAZINE (THORAZINE) IV 12.5 mg (02/09/19 2113)  . dextrose 5 % and 0.9% NaCl 125 mL/hr at 02/09/19 2000  . famotidine (PEPCID) IV 20 mg (02/09/19 2230)   PRN Meds:.Place/Maintain arterial line **AND** sodium chloride, albuterol, chlorproMAZINE (THORAZINE) IV, fentaNYL (SUBLIMAZE) injection, hydrALAZINE, ketorolac, ondansetron (ZOFRAN) IV, traMADol  Xrays DG Chest Port 1 View In am  Result Date: 02/09/2019 CLINICAL DATA:  Status post video-assisted thoracic surgery. EXAM: PORTABLE CHEST 1 VIEW COMPARISON:  February 08, 2019. FINDINGS: The heart size and mediastinal contours are within normal limits. Nasogastric tube tip is seen in proximal stomach. Two left-sided chest tubes are noted without pneumothorax. Left subclavian Port-A-Cath is unchanged. Right internal jugular catheter is unchanged. Right lung is clear. Mildly displaced left rib fracture is noted. IMPRESSION: Two left-sided chest tubes are noted without pneumothorax. Nasogastric tube tip seen in proximal stomach. Electronically Signed  By: Marijo Conception M.D.   On: 02/09/2019 09:44   DG Chest Port 1 View In am  Result Date: 02/08/2019 CLINICAL DATA:  Post resection of LEFT upper lobe mass. EXAM: PORTABLE CHEST 1 VIEW COMPARISON:  Radiograph 02/07/2019 FINDINGS: Port in the anterior chest wall with tip in distal SVC. NG tube extends the stomach. Two LEFT chest tubes in place without appreciable pneumothorax. RIGHT lung clear. Central venous  line unchanged. IMPRESSION: 1. Two LEFT chest tubes in place.  No appreciable pneumothorax. 2. NG tube extend into the stomach. Electronically Signed   By: Suzy Bouchard M.D.   On: 02/08/2019 10:08   DG Abd Portable 1V-Small Bowel Obstruction Protocol-initial, 8 hr delay  Result Date: 02/09/2019 CLINICAL DATA:  8 hour small-bowel protocol, ileus versus obstruction EXAM: PORTABLE ABDOMEN - 1 VIEW COMPARISON:  Radiograph 02/09/2019 FINDINGS: Coarse dilute contrast media is seen in the stomach and proximal small bowel. Contrast media peers to have traversed throughout the colon in layers over the rectal vault. There are persistent air distended loops of small bowel in the mid abdomen. Cholecystectomy clips again seen in the right upper quadrant. Degenerative changes in the spine and pelvis are similar to prior. IMPRESSION: Passage of contrast media to the level of the distal colon. Persistently dilated loops of mid abdominal small bowel may therefore reflect only partial obstruction or resolving ileus. Electronically Signed   By: Lovena Le M.D.   On: 02/09/2019 21:28   DG Abd Portable 1V  Result Date: 02/09/2019 CLINICAL DATA:  Abdominal distension and pain. EXAM: PORTABLE ABDOMEN - 1 VIEW COMPARISON:  February 08, 2019. FINDINGS: Mildly dilated small bowel loops are noted concerning for ileus or distal small bowel obstruction. Phleboliths are noted in the pelvis. No colonic dilatation is noted. IMPRESSION: Mildly dilated small bowel loops are noted concerning for ileus or distal small bowel obstruction. Electronically Signed   By: Marijo Conception M.D.   On: 02/09/2019 09:45   DG Abd Portable 1V  Result Date: 02/08/2019 CLINICAL DATA:  Small-bowel obstruction, nasogastric tube placement EXAM: PORTABLE ABDOMEN - 1 VIEW COMPARISON:  Same-day radiograph FINDINGS: A nasogastric tube distal tip is seen within the stomach just beyond the level of the GE junction. Stomach is air-filled. Dilated loops of small  bowel are again seen throughout the abdomen measuring up to 5.5 cm in diameter, which is similar in caliber to the prior study. Findings compatible small bowel obstruction. IMPRESSION: 1. Nasogastric tube distal tip within the stomach just beyond the level of the GE junction. Recommend advancement approximately 10 cm. 2. Findings suggestive of small bowel obstruction. Electronically Signed   By: Davina Poke D.O.   On: 02/08/2019 19:48   DG Abd Portable 1V  Result Date: 02/08/2019 CLINICAL DATA:  64 year old male with history of ileus. EXAM: PORTABLE ABDOMEN - 1 VIEW COMPARISON:  Chest x-ray 02/07/2019. FINDINGS: Nasogastric tube tip in the antral pre-pyloric region of the stomach. Diffuse dilatation of the small bowel measuring up to 5.7 cm in diameter. Patchy gas and stool noted throughout the colon, which appears decompressed. No rectal gas. No gross evidence of pneumoperitoneum noted on this single supine view. Surgical clips project over the right upper quadrant of the abdomen, likely from prior cholecystectomy. IMPRESSION: 1. Persistent small bowel dilatation with decompressed colon, concerning for partial small bowel obstruction. 2. Support apparatus and postoperative changes, as above. Electronically Signed   By: Vinnie Langton M.D.   On: 02/08/2019 10:12    Assessment/Plan: S/P  Procedure(s) (LRB): VIDEO BRONCHOSCOPY (N/A) VIDEO ASSISTED THORACOSCOPY (VATS)/LUNG RESECTION (Left) MINITHORACOTOMY (Left) LEFT UPPER LOBECTOMY WITH LYMPH NODE DISSECTION (Left) INTERCOSTAL NERVE BLOCK WITH EXPAREL (Left)  1 Ileus clinically appears improved ,+ stool in colostomy.  Does not appear to have SBO at this time on Xray eval. . Significant decrease in NGT drainage yesterday- Gen surgery assisting with management 2 small air leak- cont CT's Could possibly remove one. 3 Needs to be more active, which he knows- limited by body habitus, IV's/CT's, etc and chronic knee pain. Will ask PT to assist 4 will  check labs  5 check CXR in am   LOS: 7 days    [name] 02/10/2019 Pager [phone]  Ng now out  Ct to water seal, likely remove one tube tomorrow I have seen and examined [employer] and agree with the above assessment  and plan.  Grace Isaac MD Beeper [phone] Office [phone] 02/10/2019 2:47 PM

## 2019-02-10 NOTE — Progress Notes (Signed)
NG tube removed without difficulty, patient blowing nose and gagging a small amount after but tolerated well and happy to have the NG tube removed

## 2019-02-10 NOTE — Evaluation (Signed)
Physical Therapy Evaluation Patient Details Name: [name] [name] MRN: [id number] DOB: 1955/11/07 Today's Date: 02/10/2019   History of Present Illness  64 y.o.male with history of rectal carcinoma now with LUL lung mass. Pt PMH also significant for colostomy, DOE. Pt underwent LUL lobectomy on 5/09 complicated by SBO vs Ileus.  Clinical Impression  Pt presents to PT with deficits in functional mobility, gait, balance, endurance, power. Pt independent prior to surgery, coaching high school baseball. 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 Pt limited by nausea during session, performing bed mobility with limited physical assistance. Pt will benefit from aggressive acute PT intervention and mobilization to aide in resolving ileus and to improve cardiopulmonary function s/p lobeecomty.     Follow Up Recommendations Home health PT;Supervision/Assistance - 24 hour    Equipment Recommendations  None recommended by PT(pt owns necessary DME)    Recommendations for Other Services       Precautions / Restrictions Precautions Precautions: Fall Precaution Comments: chest tube, colostomy Restrictions Weight Bearing Restrictions: No      Mobility  Bed Mobility Overal bed mobility: Needs Assistance Bed Mobility: Supine to Sit;Sit to Supine     Supine to sit: Min assist;HOB elevated Sit to supine: Min guard      Transfers Overall transfer level: (deferred 2/2 nausea)                  Ambulation/Gait                Stairs            Wheelchair Mobility    Modified Rankin (Stroke Patients Only)       Balance Overall balance assessment: Needs assistance Sitting-balance support: No upper extremity supported;Feet supported Sitting balance-Leahy Scale: Good Sitting balance - Comments: supervision   Standing balance support: (deferred 2/2 nausea)                                 Pertinent Vitals/Pain Pain  Assessment: Faces Faces Pain Scale: Hurts even more Pain Location: ches tube site Pain Descriptors / Indicators: Aching Pain Intervention(s): Limited activity within patient's tolerance    Home Living Family/patient expects to be discharged to:: Private residence Living Arrangements: Spouse/significant other Available Help at Discharge: Family;Available 24 hours/day Type of Home: House Home Access: Stairs to enter Entrance Stairs-Rails: Left Entrance Stairs-Number of Steps: 4(additional 2 with R rail) Home Layout: Two level;Able to live on main level with bedroom/bathroom Home Equipment: Gilford Rile - 2 wheels;Cane - single point;Bedside commode;Shower seat      Prior Function Level of Independence: Independent         Comments: coaches high school baseball     Hand Dominance        Extremity/Trunk Assessment   Upper Extremity Assessment Upper Extremity Assessment: Overall WFL for tasks assessed    Lower Extremity Assessment Lower Extremity Assessment: Overall WFL for tasks assessed    Cervical / Trunk Assessment Cervical / Trunk Assessment: Normal  Communication   Communication: No difficulties  Cognition Arousal/Alertness: Awake/alert Behavior During Therapy: WFL for tasks assessed/performed Overall Cognitive Status: Within Functional Limits for tasks assessed                                        General Comments General comments (skin  integrity, edema, etc.): VSS on RA, session limited by nausea with bed mobility    Exercises     Assessment/Plan    PT Assessment Patient needs continued PT services  PT Problem List Decreased activity tolerance;Decreased balance;Decreased mobility;Cardiopulmonary status limiting activity       PT Treatment Interventions DME instruction;Gait training;Stair training;Functional mobility training;Therapeutic activities;Therapeutic exercise;Balance training;Neuromuscular re-education;Patient/family education     PT Goals (Current goals can be found in the Care Plan section)  Acute Rehab PT Goals Patient Stated Goal: To return to independent mobility PT Goal Formulation: With patient/family Time For Goal Achievement: 02/24/19 Potential to Achieve Goals: Good    Frequency Min 3X/week   Barriers to discharge        Co-evaluation               AM-PAC PT "6 Clicks" Mobility  Outcome Measure Help needed turning from your back to your side while in a flat bed without using bedrails?: A Little Help needed moving from lying on your back to sitting on the side of a flat bed without using bedrails?: A Little Help needed moving to and from a bed to a chair (including a wheelchair)?: A Lot Help needed standing up from a chair using your arms (e.g., wheelchair or bedside chair)?: A Lot Help needed to walk in hospital room?: A Lot Help needed climbing 3-5 steps with a railing? : A Lot 6 Click Score: 14    End of Session Equipment Utilized During Treatment: (none) Activity Tolerance: Treatment limited secondary to medical complications (Comment)(nausea) Patient left: in bed;with call bell/phone within reach;with family/visitor present Nurse Communication: Mobility status PT Visit Diagnosis: Unsteadiness on feet (R26.81)    Time: 6979-4801 PT Time Calculation (min) (ACUTE ONLY): 15 min   Charges:   PT Evaluation $PT Eval Moderate Complexity: 1 Mod          Zenaida Niece, PT, DPT Acute Rehabilitation Pager: [phone]   Zenaida Niece 02/10/2019, 3:44 PM

## 2019-02-10 NOTE — Progress Notes (Signed)
7 Days Post-Op   Subjective/Chief Complaint: Colostomy working Patient feels better today.  He has emptied his colostomy bag 3 times.  His plain films are improved and he denies any abdominal pain.   Objective: Vital signs in last 24 hours: Temp:  [97.8 F (36.6 C)-98.4 F (36.9 C)] 98.4 F (36.9 C) (01/18 0744) Pulse Rate:  [80-94] 84 (01/18 0744) Resp:  [14-19] 19 (01/18 0744) BP: (144-167)/(82-98) 148/82 (01/18 0744) SpO2:  [95 %-100 %] 100 % (01/18 0827) Last BM Date: 02/09/19  Intake/Output from previous day: 01/17 0701 - 01/18 0700 In: 1621.8 [P.O.:300; I.V.:1282.8; IV Piggyback:39] Out: 2390 [Urine:1250; Emesis/NG output:650; Stool:400; Chest Tube:90] Intake/Output this shift: No intake/output data recorded.  General appearance: alert, cooperative, appears stated age and fatigued Head: Normocephalic, without obvious abnormality, atraumatic Neck: no JVD and supple, symmetrical, trachea midline Resp: clear to auscultation bilaterally Cardio: regular rate and rhythm, S1, S2 normal, no murmur, click, rub or gallop GI: Left lower quadrant ostomy working.  Scar noted.  Belly soft minimal distention without rebound Extremities: extremities normal, atraumatic, no cyanosis or edema Skin: Skin color, texture, turgor normal. No rashes or lesions Neurologic: Grossly normal  Lab Results:  Recent Labs    02/08/19 0406 02/09/19 0508  WBC 3.9* 3.4*  HGB 10.2* 10.2*  HCT 32.3* 32.8*  PLT 322 340   BMET Recent Labs    02/08/19 0406 02/09/19 0508  NA 143 142  K 3.8 3.5  CL 106 104  CO2 29 28  GLUCOSE 107* 106*  BUN 21 16  CREATININE 0.82 0.73  CALCIUM 8.3* 8.2*   PT/INR No results for input(s): LABPROT, INR in the last 72 hours. ABG No results for input(s): PHART, HCO3 in the last 72 hours.  Invalid input(s): PCO2, PO2  Studies/Results: DG Chest Port 1 View  Result Date: 02/10/2019 CLINICAL DATA:  Chest pain. Postop day 7 LEFT UPPER lobectomy. EXAM: PORTABLE  CHEST 1 VIEW COMPARISON:  02/09/2019 and earlier. FINDINGS: Two LEFT chest tubes in place with a small (5% or less) LEFT apical pneumothorax. Suboptimal inspiration accounts for mild bibasilar atelectasis, unchanged. Lungs remain clear otherwise. RIGHT jugular central venous catheter tip projects at or near the cavoatrial junction, unchanged. Nasogastric tube courses below the diaphragm into the stomach. LEFT subclavian Port-A-Cath tip projects over the mid SVC. IMPRESSION: 1. Small (5% or less) LEFT apical pneumothorax with 2 LEFT chest tubes in place. 2. Suboptimal inspiration accounts for mild bibasilar atelectasis. No acute cardiopulmonary disease otherwise. 3. Support apparatus satisfactory. Electronically Signed   By: Evangeline Dakin M.D.   On: 02/10/2019 08:25   DG Chest Port 1 View In am  Result Date: 02/09/2019 CLINICAL DATA:  Status post video-assisted thoracic surgery. EXAM: PORTABLE CHEST 1 VIEW COMPARISON:  February 08, 2019. FINDINGS: The heart size and mediastinal contours are within normal limits. Nasogastric tube tip is seen in proximal stomach. Two left-sided chest tubes are noted without pneumothorax. Left subclavian Port-A-Cath is unchanged. Right internal jugular catheter is unchanged. Right lung is clear. Mildly displaced left rib fracture is noted. IMPRESSION: Two left-sided chest tubes are noted without pneumothorax. Nasogastric tube tip seen in proximal stomach. Electronically Signed   By: Marijo Conception M.D.   On: 02/09/2019 09:44   DG Abd Portable 1V  Result Date: 02/10/2019 CLINICAL DATA:  Abdominal pain and distension. Postop day 7 LEFT UPPER lobectomy. Follow-up postoperative ileus. EXAM: PORTABLE ABDOMEN - 1 VIEW COMPARISON:  02/09/2019 and earlier. FINDINGS: Persisting gaseous distension of multiple small bowel  loops throughout the abdomen, unchanged over the past 2 days, improved since the examination 4 days ago. Contrast material is present throughout the decompressed  colon, and colonic diverticulosis is noted. No suggestion of free air on the supine image. IMPRESSION: 1. Ileus (favored over partial small bowel obstruction) which is unchanged over the past 2 days, though improved since the examination 4 days ago. 2. No suggestion of free air on the supine image. Electronically Signed   By: Evangeline Dakin M.D.   On: 02/10/2019 08:29   DG Abd Portable 1V-Small Bowel Obstruction Protocol-initial, 8 hr delay  Result Date: 02/09/2019 CLINICAL DATA:  8 hour small-bowel protocol, ileus versus obstruction EXAM: PORTABLE ABDOMEN - 1 VIEW COMPARISON:  Radiograph 02/09/2019 FINDINGS: Coarse dilute contrast media is seen in the stomach and proximal small bowel. Contrast media peers to have traversed throughout the colon in layers over the rectal vault. There are persistent air distended loops of small bowel in the mid abdomen. Cholecystectomy clips again seen in the right upper quadrant. Degenerative changes in the spine and pelvis are similar to prior. IMPRESSION: Passage of contrast media to the level of the distal colon. Persistently dilated loops of mid abdominal small bowel may therefore reflect only partial obstruction or resolving ileus. Electronically Signed   By: Lovena Le M.D.   On: 02/09/2019 21:28   DG Abd Portable 1V  Result Date: 02/09/2019 CLINICAL DATA:  Abdominal distension and pain. EXAM: PORTABLE ABDOMEN - 1 VIEW COMPARISON:  February 08, 2019. FINDINGS: Mildly dilated small bowel loops are noted concerning for ileus or distal small bowel obstruction. Phleboliths are noted in the pelvis. No colonic dilatation is noted. IMPRESSION: Mildly dilated small bowel loops are noted concerning for ileus or distal small bowel obstruction. Electronically Signed   By: Marijo Conception M.D.   On: 02/09/2019 09:45   DG Abd Portable 1V  Result Date: 02/08/2019 CLINICAL DATA:  Small-bowel obstruction, nasogastric tube placement EXAM: PORTABLE ABDOMEN - 1 VIEW COMPARISON:   Same-day radiograph FINDINGS: A nasogastric tube distal tip is seen within the stomach just beyond the level of the GE junction. Stomach is air-filled. Dilated loops of small bowel are again seen throughout the abdomen measuring up to 5.5 cm in diameter, which is similar in caliber to the prior study. Findings compatible small bowel obstruction. IMPRESSION: 1. Nasogastric tube distal tip within the stomach just beyond the level of the GE junction. Recommend advancement approximately 10 cm. 2. Findings suggestive of small bowel obstruction. Electronically Signed   By: Davina Poke D.O.   On: 02/08/2019 19:48    Anti-infectives: Anti-infectives (From admission, onward)   Start     Dose/Rate Route Frequency Ordered Stop   02/03/19 2000  ceFAZolin (ANCEF) IVPB 2g/100 mL premix     2 g 200 mL/hr over 30 Minutes Intravenous Every 8 hours 02/03/19 1851 02/04/19 0459   02/03/19 0600  ceFAZolin (ANCEF) 3 g in dextrose 5 % 50 mL IVPB     3 g 100 mL/hr over 30 Minutes Intravenous 30 min pre-op 01/31/19 0802 02/03/19 1230      Assessment/Plan: Hx of rectal cancer s/p resection and colostomy 5 years ago,          chemo/radiation S/p Left upper lobectomy for metastatic lung mass 02/03/19 Dr. Servando Snare  SBO vs ileus - abdominal film improved  DC NG tube  Start clear liquids.  If he tolerates this, his diet can be advanced tomorrow.   LOS: 7 days  [name] [name] 02/10/2019

## 2019-02-11 ENCOUNTER — Inpatient Hospital Stay (HOSPITAL_COMMUNITY): Payer: 59

## 2019-02-11 LAB — CBC
HCT: 31.8 % — ABNORMAL LOW (ref 39.0–52.0)
Hemoglobin: 10.4 g/dL — ABNORMAL LOW (ref 13.0–17.0)
MCH: 30.9 pg (ref 26.0–34.0)
MCHC: 32.7 g/dL (ref 30.0–36.0)
MCV: 94.4 fL (ref 80.0–100.0)
Platelets: 366 10*3/uL (ref 150–400)
RBC: 3.37 MIL/uL — ABNORMAL LOW (ref 4.22–5.81)
RDW: 13.6 % (ref 11.5–15.5)
WBC: 4.1 10*3/uL (ref 4.0–10.5)
nRBC: 0 % (ref 0.0–0.2)

## 2019-02-11 LAB — BASIC METABOLIC PANEL
Anion gap: 7 (ref 5–15)
BUN: 9 mg/dL (ref 8–23)
CO2: 24 mmol/L (ref 22–32)
Calcium: 7.9 mg/dL — ABNORMAL LOW (ref 8.9–10.3)
Chloride: 108 mmol/L (ref 98–111)
Creatinine, Ser: 0.69 mg/dL (ref 0.61–1.24)
GFR calc Af Amer: >60 mL/min (ref >60)
GFR calc non Af Amer: >60 mL/min (ref >60)
Glucose, Bld: 104 mg/dL — ABNORMAL HIGH (ref 70–99)
Potassium: 3.5 mmol/L (ref 3.5–5.1)
Sodium: 139 mmol/L (ref 135–145)

## 2019-02-11 MED ORDER — AMLODIPINE BESYLATE 5 MG PO TABS
5.0000 mg | ORAL_TABLET | Freq: Every day | ORAL | Status: DC
  Administered 2019-02-11: 5 mg via ORAL
  Filled 2019-02-11: qty 1

## 2019-02-11 MED ORDER — FAMOTIDINE 20 MG PO TABS
20.0000 mg | ORAL_TABLET | Freq: Every day | ORAL | Status: DC
  Administered 2019-02-12 – 2019-02-14 (×3): 20 mg via ORAL
  Filled 2019-02-11 (×3): qty 1

## 2019-02-11 MED ORDER — POTASSIUM CHLORIDE 10 MEQ/100ML IV SOLN
10.0000 meq | INTRAVENOUS | Status: AC
  Administered 2019-02-11 (×4): 10 meq via INTRAVENOUS
  Filled 2019-02-11 (×4): qty 100

## 2019-02-11 MED ORDER — ALUM & MAG HYDROXIDE-SIMETH 200-200-20 MG/5ML PO SUSP
30.0000 mL | Freq: Four times a day (QID) | ORAL | Status: DC | PRN
  Administered 2019-02-11: 30 mL via ORAL
  Filled 2019-02-11: qty 30

## 2019-02-11 NOTE — Progress Notes (Signed)
Physical Therapy Treatment Patient Details Name: [name] [name] MRN: [id number] DOB: [birth year] Today's Date: 02/11/2019    History of Present Illness 64 y.o.male with history of rectal carcinoma now with LUL lung mass. Pt PMH also significant for colostomy, DOE. Pt underwent LUL lobectomy on 2/33 complicated by SBO vs Ileus.    PT Comments    Patient is making progress toward PT goals and tolerated gait distance of 60 ft this session. Pt with c/o nausea upon arrival and when sitting EOB that went away after belching. VSS on RA. Pt with 1-2/4DOE while ambulating. Continue to progress as tolerated.     Follow Up Recommendations  Home health PT;Supervision/Assistance - 24 hour     Equipment Recommendations  None recommended by PT(pt owns necessary DME)    Recommendations for Other Services       Precautions / Restrictions Precautions Precautions: Fall Precaution Comments: chest tube, colostomy Restrictions Weight Bearing Restrictions: No    Mobility  Bed Mobility Overal bed mobility: Needs Assistance Bed Mobility: Supine to Sit     Supine to sit: Min assist;HOB elevated     General bed mobility comments: assist to elevate trunk into sitting   Transfers Overall transfer level: Needs assistance Equipment used: Rolling walker (2 wheeled);None Transfers: Sit to/from Stand Sit to Stand: Min guard         General transfer comment: cues for safe hand placement; pt able to stand pushing from bed and then use of RW upon standing; min guard for safety  Ambulation/Gait Ambulation/Gait assistance: Min assist;Min guard Gait Distance (Feet): 60 Feet Assistive device: Rolling walker (2 wheeled) Gait Pattern/deviations: Step-through pattern;Decreased stride length;Narrow base of support(slight trendelenburg gait; L LE weakness per pt) Gait velocity: decreased   General Gait Details: assistance to steady at times; pt with DOE   Environmental education officer Rankin (Stroke Patients Only)       Balance Overall balance assessment: Needs assistance Sitting-balance support: No upper extremity supported;Feet supported Sitting balance-Leahy Scale: Good     Standing balance support: During functional activity;Bilateral upper extremity supported Standing balance-Leahy Scale: Poor                              Cognition Arousal/Alertness: Awake/alert Behavior During Therapy: WFL for tasks assessed/performed Overall Cognitive Status: Within Functional Limits for tasks assessed                                        Exercises      General Comments        Pertinent Vitals/Pain Pain Assessment: No/denies pain    Home Living                      Prior Function            PT Goals (current goals can now be found in the care plan section) Progress towards PT goals: Progressing toward goals    Frequency    Min 3X/week      PT Plan Current plan remains appropriate    Co-evaluation              AM-PAC PT "6 Clicks" Mobility   Outcome Measure  Help needed turning from your back to your side while  in a flat bed without using bedrails?: A Little Help needed moving from lying on your back to sitting on the side of a flat bed without using bedrails?: A Little Help needed moving to and from a bed to a chair (including a wheelchair)?: A Little Help needed standing up from a chair using your arms (e.g., wheelchair or bedside chair)?: A Little Help needed to walk in hospital room?: A Little Help needed climbing 3-5 steps with a railing? : A Lot 6 Click Score: 17    End of Session   Activity Tolerance: Patient tolerated treatment well Patient left: with call bell/phone within reach;with family/visitor present;in chair Nurse Communication: Mobility status;Other (comment)(chect tube container full and IV beeping ) PT Visit Diagnosis: Unsteadiness on feet (R26.81)      Time: 2824-1753 PT Time Calculation (min) (ACUTE ONLY): 32 min  Charges:  $Gait Training: 8-22 mins $Therapeutic Activity: 8-22 mins                     Earney Navy, PTA Acute Rehabilitation Services Pager: [phone] Office: [phone]     Darliss Cheney 02/11/2019, 4:16 PM
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
## 2019-02-11 NOTE — Progress Notes (Signed)
During Chest tube removal it was noted under anchor secure devise open blister area on the skin.  Pt stated that area has been sore for some time, that he had told staff this before.  We removed the devise, cleaned with water, and covered with foam dressing.  Consulted wound RN for consult for evaluation and treat.

## 2019-02-11 NOTE — Plan of Care (Signed)
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
## 2019-02-12 ENCOUNTER — Inpatient Hospital Stay (HOSPITAL_COMMUNITY): Payer: 59

## 2019-02-12 LAB — ACID FAST CULTURE WITH REFLEXED SENSITIVITIES (MYCOBACTERIA)
Acid Fast Culture: NEGATIVE
Acid Fast Culture: NEGATIVE

## 2019-02-12 LAB — BASIC METABOLIC PANEL
Anion gap: 9 (ref 5–15)
BUN: 10 mg/dL (ref 8–23)
CO2: 24 mmol/L (ref 22–32)
Calcium: 8.1 mg/dL — ABNORMAL LOW (ref 8.9–10.3)
Chloride: 103 mmol/L (ref 98–111)
Creatinine, Ser: 0.73 mg/dL (ref 0.61–1.24)
GFR calc Af Amer: >60 mL/min (ref >60)
GFR calc non Af Amer: >60 mL/min (ref >60)
Glucose, Bld: 111 mg/dL — ABNORMAL HIGH (ref 70–99)
Potassium: 3.4 mmol/L — ABNORMAL LOW (ref 3.5–5.1)
Sodium: 136 mmol/L (ref 135–145)

## 2019-02-12 MED ORDER — SODIUM CHLORIDE 0.9% FLUSH
10.0000 mL | Freq: Two times a day (BID) | INTRAVENOUS | Status: DC
  Administered 2019-02-12 – 2019-02-14 (×5): 10 mL

## 2019-02-12 MED ORDER — AMLODIPINE BESYLATE 10 MG PO TABS
10.0000 mg | ORAL_TABLET | Freq: Every day | ORAL | Status: DC
  Administered 2019-02-12 – 2019-02-14 (×3): 10 mg via ORAL
  Filled 2019-02-12 (×3): qty 1

## 2019-02-12 MED ORDER — POTASSIUM CHLORIDE CRYS ER 20 MEQ PO TBCR
40.0000 meq | EXTENDED_RELEASE_TABLET | Freq: Two times a day (BID) | ORAL | Status: AC
  Administered 2019-02-12 (×2): 40 meq via ORAL
  Filled 2019-02-12 (×2): qty 2

## 2019-02-12 MED ORDER — SODIUM CHLORIDE 0.9% FLUSH
10.0000 mL | INTRAVENOUS | Status: DC | PRN
  Administered 2019-02-13: 10 mL
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
## 2019-02-12 NOTE — Discharge Instructions (Signed)
Discharge Instructions:  1. You may shower, please wash incisions daily with soap and water and keep dry.  If you wish to cover wounds with dressing you may do so but please keep clean and change daily.  No tub baths or swimming until incisions have completely healed.  If your incisions become red or develop any drainage please call our office at [phone]  2. No Driving until cleared by our office and you are no longer using narcotic pain medications  3. Monitor your weight daily.. Please use the same scale and weigh at same time... If you gain 3-5 lbs in 48 hours with associated lower extremity swelling, please contact our office at [phone]  4. Fever of 101.5 for at least 24 hours, please contact our office at [phone]  5. Activity- up as tolerated, please walk at least 3 times per day.  Avoid strenuous activity, no lifting, pushing, or pulling with your arms over 8-10 lbs for a minimum of 6 weeks  6. If any questions or concerns arise, please do not hesitate to contact our office at [phone]    Low-Fiber Eating Plan Please follow this eating plan for the next 3-4 weeks. After which, please transition to a high fiber eating plan.  Fiber is found in fruits, vegetables, whole grains, and beans. Eating a diet low in fiber helps to reduce how often you have bowel movements and how much you produce during a bowel movement. A low-fiber eating plan may help your digestive system heal if:  You have certain conditions, such as Crohn's disease or diverticulitis.  You recently had radiation therapy on your pelvis or bowel.  You recently had intestinal surgery.  You have a new surgical opening in your abdomen (colostomy or ileostomy).  Your intestine is narrowed (stricture). Your health care provider will determine how long you need to stay on this diet. Your health care provider may recommend that you work with a diet and nutrition specialist (dietitian). What are tips for  following this plan? General guidelines  Follow recommendations from your dietitian about how much fiber you should have each day.  Most people on this eating plan should try to eat less than 10 grams (g) of fiber each day. Your daily fiber goal is _________________ g.  Take vitamin and mineral supplements as told by your health care provider or dietitian. Chewable or liquid forms are best when on this eating plan. Reading food labels  Check food labels for the amount of dietary fiber.  Choose foods that have less than 2 grams of fiber in one serving. Cooking  Use white flour and other allowed grains for baking and cooking.  Cook meat using methods that keep it tender, such as braising or poaching.  Cook eggs until the yolk is completely solid.  Cook with healthy oils, such as olive oil or canola oil. Meal planning   Eat 5-6 small meals throughout the day instead of 3 large meals.  If you are lactose intolerant: ? Choose low-lactose dairy foods. ? Do not eat dairy foods, if told by your dietitian.  Limit fat and oils to less than 8 teaspoons a day.  Eat small portions of desserts. What foods are allowed? The items listed below may not be a complete list. Talk with your dietitian about what dietary choices are best for you. Grains All bread and crackers made with white flour. Waffles, pancakes, and [place] toast. Bagels. Pretzels. Melba toast, zwieback, and matzoh. Cooked and dried cereals that do not  contain whole grains, added fiber, seeds, or dried fruit. CornmealDomenick Gong. Hot and cold cereals made with refined corn, wheat, rice, or oats. Plain pasta and noodles. White rice. Vegetables Well-cooked or canned vegetables without skin, seeds, or stems. Cooked potatoes without skins. Vegetable juice. Fruits Soft-cooked or canned fruits without skin and seeds. Peeled ripe banana. Applesauce. Fruit juice without pulp. Meats and other protein foods Ground meat. Tender cuts of meat  or poultry. Eggs. Fish, seafood, and shellfish. Smooth nut butters. Tofu. Dairy All milk products and drinks. Lactose-free milks, including rice, soy, and almond milks. Yogurt without fruit, nuts, chocolate, or granola mix-ins. Sour cream. Cottage cheese. Cheese. Beverages Decaf coffee. Fruit and vegetable juices or smoothies (in small amounts, with no pulp or skins, and with fruits from allowed list). Sports drinks. Herbal tea. Fats and oils Olive oil, canola oil, sunflower oil, flaxseed oil, and grapeseed oil. Mayonnaise. Cream cheese. Margarine. Butter. Sweets and desserts Plain cakes and cookies. Cream pies and pies made with allowed fruits. Pudding. Custard. Fruit gelatin. Sherbet. Popsicles. Ice cream without nuts. Plain hard candy. Honey. Jelly. Molasses. Syrups, including chocolate syrup. Chocolate. Marshmallows. Gumdrops. Seasoning and other foods Bouillon. Broth. Cream soups made from allowed foods. Strained soup. Casseroles made with allowed foods. Ketchup. Mild mustard. Mild salad dressings. Plain gravies. Vinegar. Spices in moderation. Salt. Sugar. What foods are not allowed? The items listed below may not be a complete list. Talk with your dietitian about what dietary choices are best for you. Grains Whole wheat and whole grain breads and crackers. Multigrain breads and crackers. Rye bread. Whole grain or multigrain cereals. Cereals with nuts, raisins, or coconut. Bran. Coarse wheat cereals. Granola. High-fiber cereals. Cornmeal or corn bread. Whole grain pasta. Wild or brown rice. Quinoa. Popcorn. Buckwheat. Wheat germ. Vegetables Potato skins. Raw or undercooked vegetables. All beans and bean sprouts. Cooked greens. Corn. Peas. Cabbage. Beets. Broccoli. Brussels sprouts. Cauliflower. Mushrooms. Onions. Peppers. Parsnips. Okra. Sauerkraut. Fruit Raw or dried fruit. Berries. Fruit juice with pulp. Prune juice. Meats and other protein foods Tough, fibrous meats with gristle. Fatty  meat. Poultry with skin. Fried meat, Sales executive, or fish. Deli or lunch meats. Sausage, bacon, and hot dogs. Nuts and chunky nut butter. Dried peas, beans, and lentils. Dairy Yogurt with fruit, nuts, chocolate, or granola mix-ins. Beverages Caffeinated coffee and teas. Fats and oils Avocado. Coconut. Sweets and desserts Desserts, cookies, or candies that contain nuts or coconut. Dried fruit. Jams and preserves with seeds. Marmalade. Any dessert made with fruits or grains that are not allowed. Seasoning and other foods Corn tortilla chips. Soups made with vegetables or grains that are not allowed. Relish. Horseradish. Angie Fava. Olives. Summary  Most people on a low-fiber eating plan should eat less than 10 grams of fiber a day. Follow recommendations from your dietitian about how much fiber you should have each day.  Always check food labels to see the dietary fiber content of packaged foods. In general, a low-fiber food will have fewer than 2 grams of fiber per serving.  In general, try to avoid whole grains, raw fruits and vegetables, dried fruit, tough cuts of meat, nuts, and seeds.  Take a vitamin and mineral supplement as told by your health care provider or dietitian. This information is not intended to replace advice given to you by your health care provider. Make sure you discuss any questions you have with your health care provider. Document Revised: 05/03/2018 Document Reviewed: 03/14/2016 Elsevier Patient Education  [place].   High-Fiber  Diet Fiber, also called dietary fiber, is a type of carbohydrate that is found in fruits, vegetables, whole grains, and beans. A high-fiber diet can have many health benefits. Your health care provider may recommend a high-fiber diet to help:  Prevent constipation. Fiber can make your bowel movements more regular.  Lower your cholesterol.  Relieve the following conditions: ? Swelling of veins in the anus (hemorrhoids). ? Swelling  and irritation (inflammation) of specific areas of the digestive tract (uncomplicated diverticulosis). ? A problem of the large intestine (colon) that sometimes causes pain and diarrhea (irritable bowel syndrome, IBS).  Prevent overeating as part of a weight-loss plan.  Prevent heart disease, type 2 diabetes, and certain cancers. What is my plan? The recommended daily fiber intake in grams (g) includes:  38 g for men age 52 or younger.  30 g for men over age 35.  29 g for women age 49 or younger.  21 g for women over age 82. You can get the recommended daily intake of dietary fiber by:  Eating a variety of fruits, vegetables, grains, and beans.  Taking a fiber supplement, if it is not possible to get enough fiber through your diet. What do I need to know about a high-fiber diet?  It is better to get fiber through food sources rather than from fiber supplements. There is not a lot of research about how effective supplements are.  Always check the fiber content on the nutrition facts label of any prepackaged food. Look for foods that contain 5 g of fiber or more per serving.  Talk with a diet and nutrition specialist (dietitian) if you have questions about specific foods that are recommended or not recommended for your medical condition, especially if those foods are not listed below.  Gradually increase how much fiber you consume. If you increase your intake of dietary fiber too quickly, you may have bloating, cramping, or gas.  Drink plenty of water. Water helps you to digest fiber. What are tips for following this plan?  Eat a wide variety of high-fiber foods.  Make sure that half of the grains that you eat each day are whole grains.  Eat breads and cereals that are made with whole-grain flour instead of refined flour or white flour.  Eat brown rice, bulgur wheat, or millet instead of white rice.  Start the day with a breakfast that is high in fiber, such as a cereal that  contains 5 g of fiber or more per serving.  Use beans in place of meat in soups, salads, and pasta dishes.  Eat high-fiber snacks, such as berries, raw vegetables, nuts, and popcorn.  Choose whole fruits and vegetables instead of processed forms like juice or sauce. What foods can I eat?  Fruits Berries. Pears. Apples. Oranges. Avocado. Prunes and raisins. Dried figs. Vegetables Sweet potatoes. Spinach. Kale. Artichokes. Cabbage. Broccoli. Cauliflower. Green peas. Carrots. Squash. Grains Whole-grain breads. Multigrain cereal. Oats and oatmeal. Brown rice. Barley. Bulgur wheat. [place]. Quinoa. Bran muffins. Popcorn. Rye wafer crackers. Meats and other proteins Navy, kidney, and pinto beans. Soybeans. Split peas. Lentils. Nuts and seeds. Dairy Fiber-fortified yogurt. Beverages Fiber-fortified soy milk. Fiber-fortified orange juice. Other foods Fiber bars. The items listed above may not be a complete list of recommended foods and beverages. Contact a dietitian for more options. What foods are not recommended? Fruits Fruit juice. Cooked, strained fruit. Vegetables Fried potatoes. Canned vegetables. Well-cooked vegetables. Grains White bread. Pasta made with refined flour. White rice. Meats and other  proteins Fatty cuts of meat. Fried chicken or fried fish. Dairy Milk. Yogurt. Cream cheese. Sour cream. Fats and oils Butters. Beverages Soft drinks. Other foods Cakes and pastries. The items listed above may not be a complete list of foods and beverages to avoid. Contact a dietitian for more information. Summary  Fiber is a type of carbohydrate. It is found in fruits, vegetables, whole grains, and beans.  There are many health benefits of eating a high-fiber diet, such as preventing constipation, lowering blood cholesterol, helping with weight loss, and reducing your risk of heart disease, diabetes, and certain cancers.  Gradually increase your intake of fiber. Increasing too  fast can result in cramping, bloating, and gas. Drink plenty of water while you increase your fiber.  The best sources of fiber include whole fruits and vegetables, whole grains, nuts, seeds, and beans. This information is not intended to replace advice given to you by your health care provider. Make sure you discuss any questions you have with your health care provider. Document Revised: 11/13/2016 Document Reviewed: 11/13/2016 Elsevier Patient Education  2020 [employer].

## 2019-02-12 NOTE — Consult Note (Signed)
[facility] Nurse Consult Note: Reason for Consult:Medical Adhesive Related Skin Injury to left flank.  Patient has chest tube and tape has stripped away epithelium. Wound type:MARSI  Partial thickness tissue loss Pressure Injury POA: NA Measurement:3 cm x 4 cm x0.1 cm  Wound TRR:NHAF and moist Drainage (amount, consistency, odor) minimal serosanguinous  No odor Periwound:intact  Chest tube.  Hair on abdomen Dressing procedure/placement/frequency:Cleanse wound to left flank with NS.  Apply aquacel Ag to wound bed and cover with foam.  Leave in place for 7 days.  Will be removed 02/19/19  (WIFE is a nurse and will do this if discharged)  Will not follow at this time.  Please re-consult if needed.  Domenic Moras MSN, RN, FNP-BC CWON Wound, Ostomy, Continence Nurse Pager [phone]

## 2019-02-12 NOTE — Progress Notes (Signed)
Chest tube drainage amount : 20 ml, but oozing from chest tube area which was soaking wet. Changed dressing. Removed central line. HS [employer]

## 2019-02-12 NOTE — Progress Notes (Signed)
Called to start PIV  Attempt without success.  Patient states he rather he had his Port a Cath access which this Probation officer did not realized he had before attempting PIV access.  PAC accessed as per protocol orders

## 2019-02-12 NOTE — Progress Notes (Signed)
There was central line remove order, requested IV team for PIV access. However, IV team tried PIV access then failed. He accessed port a cath which PA Wayne didn't want to do it for it. PA [place] made ware of IV team accessing the port a cath. HS [employer]

## 2019-02-12 NOTE — Final Consult Note (Signed)
Consultant Final Sign-Off Note    Assessment/Final recommendations  [name] [name] is a 64 y.o. male followed by me for SBO. Patient is tolerating a diet without emesis and having bowel function.   Wound care (if applicable):    Diet at discharge: soft diet for the next 4 weeks, then high fiber to follow    Activity at discharge: per primary team   Follow-up appointment:  PRN   Pending results:  Unresulted Labs (From admission, onward)    Start     Ordered   02/13/19 9562  Basic metabolic panel  Tomorrow morning,   R    Question:  Specimen collection method  Answer:  Unit=Unit collect   02/12/19 0713   02/13/19 0500  CBC  Tomorrow morning,   R    Question:  Specimen collection method  Answer:  Unit=Unit collect   02/12/19 0713   02/03/19 1453  Acid Fast Culture with reflexed sensitivities  RELEASE UPON ORDERING,   STAT    Comments: Specimen 3: Phone # [phone]        Previous Biopsy:  no Is the patient on airborne/droplet precautions? No           Clinical History:  Lung Mas Copy of Report to:  Dr. Grace Isaac, Md; FNP Raelyn Ensign E Specimen Disposition: Delivered to Histology     02/03/19 1453           Medication recommendations:    Other recommendations:    Thank you for allowing [place] to participate in the care of your patient!  Please consult [place] again if you have further needs for your patient.  Barth Kirks [facility] 02/12/2019 7:40 AM    Subjective   Patient reports that he is doing okay with FLD. Eating 1/2 of the tray but not a fan of some of the selections. He did buy a boost yesterday and tolerated that. Had occasional nausea that relieved after zofran. None this am. He denies any abdominal pain. He is having colostomy output.   Objective  Vital signs in last 24 hours: Temp:  [98.3 F (36.8 C)-98.6 F (37 C)] 98.3 F (36.8 C) (01/20 0729) Pulse Rate:  [69-87] 70 (01/20 0729) Resp:  [13-20] 19 (01/20 0729) BP: (134-169)/(78-85) 134/79 (01/20  0729) SpO2:  [97 %-100 %] 98 % (01/20 0400)  Gen:  Alert, NAD, pleasant Lungs: Normal rate and effort  Abd: Soft, ND, NT, +BS, colostomy bag with stool and gas present Ext:  No LE edema Psych: A&Ox3  Skin: no rashes noted, warm and dry  Pertinent labs and Studies: Recent Labs    02/11/19 0520  WBC 4.1  HGB 10.4*  HCT 31.8*   BMET Recent Labs    02/11/19 0520 02/12/19 0258  NA 139 136  K 3.5 3.4*  CL 108 103  CO2 24 24  GLUCOSE 104* 111*  BUN 9 10  CREATININE 0.69 0.73  CALCIUM 7.9* 8.1*   No results for input(s): LABURIN in the last 72 hours. Results for orders placed or performed during the hospital encounter of 02/03/19  Fungus Culture With Stain     Status: None (Preliminary result)   Collection Time: 02/03/19  2:45 PM   Specimen: PATH Respiratory tumor resection; Tissue  Result Value Ref Range Status   Fungus Stain Final report  Final    Comment: (NOTE) Performed At: [facility] [place], [place] [id number] Rush Farmer MD WU:[id number]    Fungus (Mycology) Culture PENDING  Incomplete   Fungal Source TISSUE  Final    Comment: LEFT UPPER LUNG Performed at [facility], [place] [address], [place], [place] [address]   Aerobic/Anaerobic Culture (surgical/deep wound)     Status: None   Collection Time: 02/03/19  2:45 PM   Specimen: PATH Respiratory tumor resection; Tissue  Result Value Ref Range Status   Specimen Description TISSUE LEFT UPPER LUNG  Final   Special Requests TUMOR  Final   Gram Stain   Final    RARE WBC PRESENT, PREDOMINANTLY PMN NO ORGANISMS SEEN    Culture   Final    No growth aerobically or anaerobically. Performed at [facility], [place] [address] [place], [place] [address]    Report Status 02/08/2019 FINAL  Final  Acid Fast Smear (AFB)     Status: None   Collection Time: 02/03/19  2:45 PM   Specimen: PATH Respiratory tumor resection; Tissue  Result Value Ref Range Status   AFB Specimen Processing  Concentration  Final   Acid Fast Smear Negative  Final    Comment: (NOTE) Performed At: [facility] [place], [place] [id number] Rush Farmer MD OM:[id number]    Source (AFB) TISSUE  Final    Comment: LEFT UPPER LUNG Performed at [facility], [place] [address], [place], [place] [address]   Fungus Culture Result     Status: None   Collection Time: 02/03/19  2:45 PM  Result Value Ref Range Status   Result 1 Comment  Final    Comment: (NOTE) KOH/Calcofluor preparation:  no fungus observed. Performed At: [facility] [place], [place] [id number] Rush Farmer MD TK:[id number]     Imaging: No results found.

## 2019-02-13 ENCOUNTER — Inpatient Hospital Stay (HOSPITAL_COMMUNITY): Payer: 59

## 2019-02-13 LAB — CBC
HCT: 35.5 % — ABNORMAL LOW (ref 39.0–52.0)
Hemoglobin: 11.4 g/dL — ABNORMAL LOW (ref 13.0–17.0)
MCH: 30 pg (ref 26.0–34.0)
MCHC: 32.1 g/dL (ref 30.0–36.0)
MCV: 93.4 fL (ref 80.0–100.0)
Platelets: 477 10*3/uL — ABNORMAL HIGH (ref 150–400)
RBC: 3.8 MIL/uL — ABNORMAL LOW (ref 4.22–5.81)
RDW: 13.5 % (ref 11.5–15.5)
WBC: 5.3 10*3/uL (ref 4.0–10.5)
nRBC: 0 % (ref 0.0–0.2)

## 2019-02-13 LAB — BASIC METABOLIC PANEL
Anion gap: 9 (ref 5–15)
BUN: 10 mg/dL (ref 8–23)
CO2: 25 mmol/L (ref 22–32)
Calcium: 8.5 mg/dL — ABNORMAL LOW (ref 8.9–10.3)
Chloride: 103 mmol/L (ref 98–111)
Creatinine, Ser: 0.84 mg/dL (ref 0.61–1.24)
GFR calc Af Amer: >60 mL/min (ref >60)
GFR calc non Af Amer: >60 mL/min (ref >60)
Glucose, Bld: 95 mg/dL (ref 70–99)
Potassium: 3.5 mmol/L (ref 3.5–5.1)
Sodium: 137 mmol/L (ref 135–145)

## 2019-02-13 MED ORDER — POTASSIUM CHLORIDE CRYS ER 20 MEQ PO TBCR
40.0000 meq | EXTENDED_RELEASE_TABLET | Freq: Two times a day (BID) | ORAL | Status: AC
  Administered 2019-02-13 (×2): 40 meq via ORAL
  Filled 2019-02-13 (×2): qty 2

## 2019-02-13 NOTE — Plan of Care (Signed)

## 2019-02-13 NOTE — Progress Notes (Signed)
Physical Therapy Treatment Patient Details Name: [name] [name] MRN: [id number] DOB: [birth year] Today's Date: 02/13/2019    History of Present Illness 64 y.o.male with history of rectal carcinoma now with LUL lung mass. Pt PMH also significant for colostomy, DOE. Pt underwent LUL lobectomy on 2/83 complicated by SBO vs Ileus.    PT Comments    Pt tolerated treatment well demonstrating improved mobility and progression to gait. Pt is mildly unsteady and requires use of RW initially, but with increased mobility during session becomes more stable with less balance deviations noted. Pt will benefit from continued acute PT POC to improve stability during OOB activity and aide in a return to independence. Pt declining need for PT follow-up at discharge, feeling confident he and his spouse can manage ambulation progression.   Follow Up Recommendations  No PT follow up;Supervision - Intermittent     Equipment Recommendations  (pt owns necessary DME)    Recommendations for Other Services       Precautions / Restrictions Precautions Precautions: Fall Precaution Comments: colostomy Restrictions Weight Bearing Restrictions: No    Mobility  Bed Mobility Overal bed mobility: Modified Independent Bed Mobility: Supine to Sit     Supine to sit: Modified independent (Device/Increase time)     General bed mobility comments: increased time  Transfers Overall transfer level: Needs assistance Equipment used: Rolling walker (2 wheeled) Transfers: Sit to/from Stand Sit to Stand: Supervision            Ambulation/Gait Ambulation/Gait assistance: Supervision Gait Distance (Feet): 120 Feet Assistive device: Rolling walker (2 wheeled) Gait Pattern/deviations: Step-to pattern Gait velocity: functional Gait velocity interpretation: 1.31 - 2.62 ft/sec, indicative of limited community ambulator General Gait Details: shortened step to gait with reduced gait speed during turns, one minor knee  buckle of L knee with no LOB   Stairs             Wheelchair Mobility    Modified Rankin (Stroke Patients Only)       Balance Overall balance assessment: Needs assistance Sitting-balance support: No upper extremity supported;Feet supported Sitting balance-Leahy Scale: Good Sitting balance - Comments: modI   Standing balance support: Bilateral upper extremity supported Standing balance-Leahy Scale: Fair Standing balance comment: Close supervision, one LOB noted requiring minG and stepping reflex to recover initially after getting OOB, pt attributes this to arthritis and stiffness in knees                            Cognition Arousal/Alertness: Awake/alert Behavior During Therapy: WFL for tasks assessed/performed Overall Cognitive Status: Within Functional Limits for tasks assessed                                        Exercises      General Comments General comments (skin integrity, edema, etc.): Pt with mild increase in work of breathing, denies SOB, pulse oximeter with unrelaible reading during ambulation, reading 93% on RA upon completion of walk      Pertinent Vitals/Pain Pain Assessment: Faces Faces Pain Scale: Hurts little more Pain Location: knees Pain Descriptors / Indicators: Aching Pain Intervention(s): Limited activity within patient's tolerance    Home Living                      Prior Function  PT Goals (current goals can now be found in the care plan section) Acute Rehab PT Goals Patient Stated Goal: To return to independent mobility Progress towards PT goals: Progressing toward goals    Frequency    Min 3X/week      PT Plan Discharge plan needs to be updated    Co-evaluation              AM-PAC PT "6 Clicks" Mobility   Outcome Measure  Help needed turning from your back to your side while in a flat bed without using bedrails?: None Help needed moving from lying on your back  to sitting on the side of a flat bed without using bedrails?: None Help needed moving to and from a bed to a chair (including a wheelchair)?: None Help needed standing up from a chair using your arms (e.g., wheelchair or bedside chair)?: None Help needed to walk in hospital room?: A Little Help needed climbing 3-5 steps with a railing? : A Little 6 Click Score: 22    End of Session Equipment Utilized During Treatment: (none) Activity Tolerance: Patient tolerated treatment well Patient left: in chair;with call bell/phone within reach Nurse Communication: Mobility status PT Visit Diagnosis: Unsteadiness on feet (R26.81)     Time: 7353-2992 PT Time Calculation (min) (ACUTE ONLY): 17 min  Charges:  $Gait Training: 8-22 mins                     Zenaida Niece, PT, DPT Acute Rehabilitation Pager: [phone]    Zenaida Niece 02/13/2019, 3:23 PM

## 2019-02-13 NOTE — Progress Notes (Signed)
[address]       [place] [address]             [phone]      10 Days Post-Op Procedure(s) (LRB): VIDEO BRONCHOSCOPY (N/A) VIDEO ASSISTED THORACOSCOPY (VATS)/LUNG RESECTION (Left) MINITHORACOTOMY (Left) LEFT UPPER LOBECTOMY WITH LYMPH NODE DISSECTION (Left) INTERCOSTAL NERVE BLOCK WITH EXPAREL (Left) Subjective: Feels well , no new c/o  Objective: Vital signs in last 24 hours: Temp:  [97.7 F (36.5 C)-99.1 F (37.3 C)] 98.6 F (37 C) (01/21 0334) Pulse Rate:  [71-81] 80 (01/20 1944) Cardiac Rhythm: Normal sinus rhythm (01/21 0700) Resp:  [14-29] 14 (01/21 0300) BP: (131-148)/(70-84) 148/79 (01/21 0334) SpO2:  [97 %-100 %] 99 % (01/20 1944)  Hemodynamic parameters for last 24 hours:    Intake/Output from previous day: 01/20 0701 - 01/21 0700 In: 1177.5 [P.O.:320; I.V.:857.5] Out: 2535 [Urine:1375; YBOFB:5102] Intake/Output this shift: No intake/output data recorded.  General appearance: alert, cooperative and no distress Heart: regular rate and rhythm Lungs: clear to auscultation bilaterally Abdomen: benign Extremities: no edema or calf tenderness Wound: incis healing well  Lab Results: Recent Labs    02/11/19 0520 02/13/19 0500  WBC 4.1 5.3  HGB 10.4* 11.4*  HCT 31.8* 35.5*  PLT 366 477*   BMET:  Recent Labs    02/12/19 0258 02/13/19 0500  NA 136 137  K 3.4* 3.5  CL 103 103  CO2 24 25  GLUCOSE 111* 95  BUN 10 10  CREATININE 0.73 0.84  CALCIUM 8.1* 8.5*    PT/INR: No results for input(s): LABPROT, INR in the last 72 hours. ABG    Component Value Date/Time   PHART 7.422 02/04/2019 0345   HCO3 25.0 02/04/2019 0345   TCO2 28 02/03/2019 1557   ACIDBASEDEF 1.0 02/03/2019 1520   O2SAT 97.8 02/04/2019 0345   CBG (last 3)  No results for input(s): GLUCAP in the last 72 hours.  Meds Scheduled Meds: . acetaminophen  1,000 mg Oral Q6H   Or  . acetaminophen (TYLENOL) oral liquid 160 mg/5 mL  1,000 mg Oral Q6H  .  amLODipine  10 mg Oral Daily  . butamben-tetracaine-benzocaine  1 spray Topical Once  . Chlorhexidine Gluconate Cloth  6 each Topical Daily  . enoxaparin (LOVENOX) injection  40 mg Subcutaneous Q24H  . famotidine  20 mg Oral Daily  . sodium chloride flush  10-40 mL Intracatheter Q12H  . timolol  1 drop Right Eye QHS   Continuous Infusions: . sodium chloride    . chlorproMAZINE (THORAZINE) IV 50 mL/hr at 02/11/19 1500  . dextrose 5 % and 0.9% NaCl 10 mL/hr at 02/12/19 0915   PRN Meds:.Place/Maintain arterial line **AND** sodium chloride, albuterol, alum & mag hydroxide-simeth, chlorproMAZINE (THORAZINE) IV, fentaNYL (SUBLIMAZE) injection, hydrALAZINE, ondansetron (ZOFRAN) IV, sodium chloride flush, traMADol  Xrays DG Chest Port 1 View  Result Date: 02/13/2019 CLINICAL DATA:  Chest tube. EXAM: PORTABLE CHEST 1 VIEW COMPARISON:  02/12/2019. FINDINGS: Interim removal of right IJ line. Left PowerPort catheter and left chest tube in stable position. Stable tiny left apical pneumothorax. Stable mild cardiomegaly. Bilateral subsegmental atelectasis. Stable mild left pleural thickening. IMPRESSION: 1. Interim removal of right IJ line. Left PowerPort catheter left chest tube in stable position. Stable tiny left apical pneumothorax. 2.  Stable cardiomegaly. 3.  Mild bilateral subsegmental atelectasis. Electronically Signed   By: Marcello Moores  Register   On: 02/13/2019 07:43   DG Chest Port 1 View  Result Date: 02/12/2019 CLINICAL  DATA:  Left VATS with lung resection, chest tube EXAM: PORTABLE CHEST 1 VIEW COMPARISON:  Chest radiograph from one day prior. FINDINGS: Left subclavian Port-A-Cath terminates at the cavoatrial junction. Right internal jugular central venous catheter terminates in lower third of the SVC. One of the left-sided chest tube has been removed. The other left-sided chest tube is stable in position with tip in the upper left pleural space. Stable cardiomediastinal silhouette with top-normal  heart size. Probable tiny left apical pneumothorax, less than 5%, not definitely seen on prior. No right pneumothorax. No pleural effusion. No pulmonary edema. Stable volume loss in the left hemithorax. IMPRESSION: Probable tiny left apical pneumothorax status post removal of 1 of the 2 left chest tubes, not definitely seen on yesterday's radiograph. Electronically Signed   By: Ilona Sorrel M.D.   On: 02/12/2019 08:31    Assessment/Plan: S/P Procedure(s) (LRB): VIDEO BRONCHOSCOPY (N/A) VIDEO ASSISTED THORACOSCOPY (VATS)/LUNG RESECTION (Left) MINITHORACOTOMY (Left) LEFT UPPER LOBECTOMY WITH LYMPH NODE DISSECTION (Left) INTERCOSTAL NERVE BLOCK WITH EXPAREL (Left)  1 conts to do well 2 BP control improved- cont norvasc, sinus rhythm 3 no fevers 4 CXR is stable with tiny apical space 5 CT- no air leak on H2O seal, minimal drainage- will d/c CT today 6 cont current diet 7 routine rehab and pulm toilet 8 labs stable- replace K+  LOS: 10 days    [name] 02/13/2019 Pager [phone]

## 2019-02-14 ENCOUNTER — Inpatient Hospital Stay (HOSPITAL_COMMUNITY): Payer: 59

## 2019-02-14 LAB — BASIC METABOLIC PANEL
Anion gap: 9 (ref 5–15)
BUN: 15 mg/dL (ref 8–23)
CO2: 22 mmol/L (ref 22–32)
Calcium: 8.6 mg/dL — ABNORMAL LOW (ref 8.9–10.3)
Chloride: 104 mmol/L (ref 98–111)
Creatinine, Ser: 0.92 mg/dL (ref 0.61–1.24)
GFR calc Af Amer: >60 mL/min (ref >60)
GFR calc non Af Amer: >60 mL/min (ref >60)
Glucose, Bld: 102 mg/dL — ABNORMAL HIGH (ref 70–99)
Potassium: 3.8 mmol/L (ref 3.5–5.1)
Sodium: 135 mmol/L (ref 135–145)

## 2019-02-14 MED ORDER — TRAMADOL HCL 50 MG PO TABS: 50.0000 mg | ORAL_TABLET | Freq: Four times a day (QID) | ORAL | 0 refills | Status: AC | PRN

## 2019-02-14 MED ORDER — AMLODIPINE BESYLATE 10 MG PO TABS: 10.0000 mg | ORAL_TABLET | Freq: Every day | ORAL | 1 refills | Status: DC

## 2019-02-14 MED ORDER — HEPARIN SOD (PORK) LOCK FLUSH 100 UNIT/ML IV SOLN
500.0000 [IU] | INTRAVENOUS | Status: AC | PRN
  Administered 2019-02-14: 500 [IU]
  Filled 2019-02-14: qty 5

## 2019-02-14 NOTE — Progress Notes (Signed)
Discharged home accompanied by wife, belongings taken home.

## 2019-02-14 NOTE — Progress Notes (Addendum)
[address]       [place] [address]             [phone]      11 Days Post-Op Procedure(s) (LRB): VIDEO BRONCHOSCOPY (N/A) VIDEO ASSISTED THORACOSCOPY (VATS)/LUNG RESECTION (Left) MINITHORACOTOMY (Left) LEFT UPPER LOBECTOMY WITH LYMPH NODE DISSECTION (Left) INTERCOSTAL NERVE BLOCK WITH EXPAREL (Left) Subjective: Feels well, wants to go home  Objective: Vital signs in last 24 hours: Temp:  [97.9 F (36.6 C)-98.7 F (37.1 C)] 98 F (36.7 C) (01/22 0328) Pulse Rate:  [63-87] 80 (01/21 1924) Cardiac Rhythm: Normal sinus rhythm (01/22 0700) Resp:  [16-20] 16 (01/22 0328) BP: (116-130)/(70-81) 124/78 (01/22 0328) SpO2:  [96 %-99 %] 97 % (01/21 1924)  Hemodynamic parameters for last 24 hours:    Intake/Output from previous day: 01/21 0701 - 01/22 0700 In: 280 [P.O.:250; I.V.:30] Out: 650 [Urine:650] Intake/Output this shift: No intake/output data recorded.  General appearance: alert, cooperative and no distress Heart: regular rate and rhythm Lungs: clear to auscultation bilaterally Abdomen: benign Extremities: no edema or calf tenderness Wound: incis healing well  Lab Results: Recent Labs    02/13/19 0500  WBC 5.3  HGB 11.4*  HCT 35.5*  PLT 477*   BMET:  Recent Labs    02/13/19 0500 02/14/19 0639  NA 137 135  K 3.5 3.8  CL 103 104  CO2 25 22  GLUCOSE 95 102*  BUN 10 15  CREATININE 0.84 0.92  CALCIUM 8.5* 8.6*    PT/INR: No results for input(s): LABPROT, INR in the last 72 hours. ABG    Component Value Date/Time   PHART 7.422 02/04/2019 0345   HCO3 25.0 02/04/2019 0345   TCO2 28 02/03/2019 1557   ACIDBASEDEF 1.0 02/03/2019 1520   O2SAT 97.8 02/04/2019 0345   CBG (last 3)  No results for input(s): GLUCAP in the last 72 hours.  Meds Scheduled Meds: . amLODipine  10 mg Oral Daily  . butamben-tetracaine-benzocaine  1 spray Topical Once  . Chlorhexidine Gluconate Cloth  6 each Topical Daily  . enoxaparin (LOVENOX)  injection  40 mg Subcutaneous Q24H  . famotidine  20 mg Oral Daily  . sodium chloride flush  10-40 mL Intracatheter Q12H  . timolol  1 drop Right Eye QHS   Continuous Infusions: . sodium chloride    . chlorproMAZINE (THORAZINE) IV 50 mL/hr at 02/11/19 1500  . dextrose 5 % and 0.9% NaCl 10 mL/hr at 02/12/19 0915   PRN Meds:.Place/Maintain arterial line **AND** sodium chloride, albuterol, alum & mag hydroxide-simeth, chlorproMAZINE (THORAZINE) IV, fentaNYL (SUBLIMAZE) injection, hydrALAZINE, ondansetron (ZOFRAN) IV, sodium chloride flush, traMADol  Xrays DG Chest 2 View  Result Date: 02/14/2019 CLINICAL DATA:  Surgery follow-up. Anticipated discharge. EXAM: CHEST - 2 VIEW COMPARISON:  Radiograph yesterday FINDINGS: Left chest port remains in place. Small left apical pneumothorax is not significantly changed, pleural line just above the posterior third rib. Left chest tube is been removed. Postsurgical volume loss in the left hemithorax. No significant pleural effusion. Streaky left lung base opacities likely atelectasis. Unchanged heart size and mediastinal contours. No pulmonary edema. Right lung is clear. Minimal subcutaneous emphysema in the left lower lateral wall. No acute osseous abnormalities are seen. IMPRESSION: 1. Unchanged small left apical pneumothorax. Left chest tube has been removed. 2. Postsurgical volume loss in the left hemithorax with left lung base atelectasis. Electronically Signed   By: [name] [name] M.D.   On: 02/14/2019 05:01   DG Chest [facility]  1 View  Result Date: 02/13/2019 CLINICAL DATA:  Chest tube. EXAM: PORTABLE CHEST 1 VIEW COMPARISON:  02/12/2019. FINDINGS: Interim removal of right IJ line. Left PowerPort catheter and left chest tube in stable position. Stable tiny left apical pneumothorax. Stable mild cardiomegaly. Bilateral subsegmental atelectasis. Stable mild left pleural thickening. IMPRESSION: 1. Interim removal of right IJ line. Left PowerPort catheter left  chest tube in stable position. Stable tiny left apical pneumothorax. 2.  Stable cardiomegaly. 3.  Mild bilateral subsegmental atelectasis. Electronically Signed   By: [name] [name]  Register   On: 02/13/2019 07:43    Assessment/Plan: S/P Procedure(s) (LRB): VIDEO BRONCHOSCOPY (N/A) VIDEO ASSISTED THORACOSCOPY (VATS)/LUNG RESECTION (Left) MINITHORACOTOMY (Left) LEFT UPPER LOBECTOMY WITH LYMPH NODE DISSECTION (Left) INTERCOSTAL NERVE BLOCK WITH EXPAREL (Left)  1 conts to do well 2 hemodyn stable in sinus, BP doing much better on Norvasc- he knows to f/u with primary for BP management 3 sats good on RA 4 labs stable 5 stable for discharge  LOS: 11 days    [name] [name] [facility] 02/14/2019 Pager [phone]  Stable for d/c today Instructions given , to follow up in office with chest xray I have seen and examined [name] [name] and agree with the above assessment  and plan.  [name] Isaac MD [facility] [phone] Office [phone] 02/14/2019 8:40 AM

## 2019-02-14 NOTE — Progress Notes (Signed)
All set to go home , discharge instructions given to pt. Awaiting ride home.

## 2019-02-16 ENCOUNTER — Encounter: Payer: Self-pay | Admitting: Oncology

## 2019-02-17 ENCOUNTER — Other Ambulatory Visit: Payer: Self-pay | Admitting: *Deleted

## 2019-02-17 ENCOUNTER — Telehealth: Payer: Self-pay

## 2019-02-17 NOTE — Patient Outreach (Addendum)
[name] [name]) Care Management  02/17/2019  [name] [name] [birth year] [id number]   Transition of care telephone call  Referral received: 01/30/19 Initial outreach: 02/17/19 Insurance: [place]  Initial unsuccessful telephone call to patient's contact number in order to complete transition of care assessment; no answer, left HIPAA compliant voicemail message requesting return call.   Objective: Per the electronic medical record, JeffreyRamirez was hospitalized at [facility] from 1/11-1/22/21 for bronchoscopy, left video assisted minithoractomy, left upper lobectomy and lymph node dissection related to adenocarcinoma of lung from rectal cancer metastasis.  Lymph node pathology was negative for cancer. Comorbidities include: hypogonadism, anemia, rectal cancer with resection and permanent colostomy, glaucoma of right eye, kidney stones, seasonal allergies He was discharged to home on 02/14/19 without the need for home health services or durable medical equipment per the discharge summary.   Addendum: [name] [name] returned call to this RNCM and left message stating he would be available this afternoon or anytime tomorrow.  Plan: RNCM will reach out to patient tomorrow to complete transition of care assessment.  Barrington Ellison [place] Management Coordinator Office Phone [phone] Office Fax [phone]

## 2019-02-17 NOTE — Telephone Encounter (Signed)
Transition Care Management Follow-up Telephone Call  Date of discharge and from where: 02/14/19 [name] [name]  How have you been since you were released from the hospital? Pt states he is doing okay, denies pain.     Any questions or concerns? Yes, patient prescribed amlodipine while in hospital. Please notify when you would like to follow up, pt due for annual exam after 03/22/18. Thank you.   Items Reviewed:  Did the pt receive and understand the discharge instructions provided? Yes   Medications obtained and verified? Yes   Any new allergies since your discharge? No   Dietary orders reviewed? Yes  Do you have support at home? Yes   Functional Questionnaire: (I = Independent and D = Dependent) ADLs: I  Bathing/Dressing- I  Meal Prep- I  Eating- I  Maintaining continence- I  Transferring/Ambulation- I  Managing Meds- I  Follow up appointments reviewed:   PCP Hospital f/u appt confirmed? No  Please advise if you need to see patient for hospital follow up appt and have front desk contact to schedule annual exam.    [facility] f/u appt confirmed? Yes    Are transportation arrangements needed? No   If their condition worsens, is the pt aware to call PCP or go to the Emergency Dept.? Yes  Was the patient provided with contact information for the PCP's office or ED? Yes  Was to pt encouraged to call back with questions or concerns? Yes

## 2019-02-18 ENCOUNTER — Other Ambulatory Visit: Payer: Self-pay | Admitting: *Deleted

## 2019-02-18 ENCOUNTER — Encounter: Payer: Self-pay | Admitting: *Deleted

## 2019-02-18 NOTE — Telephone Encounter (Signed)
Pt did not wish to schedule an appointment. He said he had spoken with someone on yesterday and they decided to wait until his appointment with Dr Oleta Mouse to see if he will need cemo

## 2019-02-18 NOTE — Patient Outreach (Signed)
[facility]) Care Management  02/18/2019  [name] [name] [birth year] [id number]   Transition of care call/case closure   Referral received:01/30/19 Initial outreach: 01/31/19 preop screening call , 02/17/19 initial post discharge call.  Insurance: [place] [employer]    Subjective: 2nd call attempt  successful telephone call to patient's preferred number in order to complete transition of care assessment; 2 HIPAA identifiers verified. Explained purpose of call and completed transition of care assessment.   [name] [name] states that he is doing pretty good. He discussed ,  surgical incisions intact without signs symptoms of infections, he states area near incision with foam dressing at skin tear site. He states having an appointment at surgeon office for staple/suture removal on tomorrow. He states surgical pain well managed with prescribed medications. He discussed having an ileus post operative, remarks that it has happened in the past .. He reports tolerating soft diet, denies nausea or vomiting, he states his ostomy output is good close to per surgery status. He discussed being pleased that his wife was able to change ostomy bag during stay in hospital and receiving assistance with empting bag as needed. He denies shortness of breath, reports continuing to use his incentive spirometry, tolerating mobility in the home, states doing well  gait steady denies dizziness.    Spouse is and RN working parttime from home and able to assist assisting with his  recovery.  He denies   any ongoing health issues and says he  does not need a referral to one of the [place] chronic disease management programs.He discussed ongoing follow up with oncology related to history of cancer and follow up after lung surgery.  States that he does not have the hospital indemnity He uses a [facility] outpatient pharmacy, [facility] outpatient pharmacy  He  denies educational needs related to staying safe during the [place] 19  pandemic.    Objective: [name] [name] was hospitalized at [facility] from 1/11-1/22/21 for bronchoscopy, left video assisted minithoractomy, left upper lobectomy and lymph node dissection related to adenocarcinoma of lung from rectal cancer metastasis.  Lymph node pathology was negative for cancer. Comorbidities include: hypogonadism, anemia, rectal cancer with resection and permanent colostomy, glaucoma of right eye, kidney stones, seasonal allergies He was discharged to home on 02/14/19 without the need for home health services or durable medical equipment per the discharge summary.    Assessment:  Patient voices good understanding of all discharge instructions.  See transition of care flowsheet for assessment details.   Plan:  Reviewed hospital discharge diagnosis of  Left video assisted mini thoracotomy, left upper lobectomy    and discharge treatment plan using hospital discharge instructions, assessing medication adherence, reviewing problems requiring provider notification, and discussing the importance of follow up with surgeon, primary care provider and/or specialists as directed.  No ongoing care management needs identified so will close case to [place] Management services and route successful outreach letter with [place] Management pamphlet and 24 Hour Nurse Line Magnet to [place] Management clinical pool to be mailed to patient's home address.   Joylene Draft, RN, BSN  [facility] Management Coordinator  [phone]- Mobile [phone]- Toll Free Main Office

## 2019-02-18 NOTE — Telephone Encounter (Signed)
Can he be worked in

## 2019-02-18 NOTE — Telephone Encounter (Signed)
Please schedule patient for hospital follow up with Dr. Ancil Boozer if possible in the next 1-2 weeks.

## 2019-02-18 NOTE — Telephone Encounter (Signed)
Please advise where to place hosp followup. Thanks

## 2019-02-18 NOTE — Telephone Encounter (Signed)
Documentation reviewed, will await his call if he needs follow up with our clinic.

## 2019-02-19 ENCOUNTER — Other Ambulatory Visit: Payer: Self-pay

## 2019-02-19 ENCOUNTER — Ambulatory Visit (INDEPENDENT_AMBULATORY_CARE_PROVIDER_SITE_OTHER): Payer: Self-pay | Admitting: *Deleted

## 2019-02-19 VITALS — Temp 97.6°F

## 2019-02-19 DIAGNOSIS — Z4802 Encounter for removal of sutures: Secondary | ICD-10-CM

## 2019-02-19 DIAGNOSIS — C3432 Malignant neoplasm of lower lobe, left bronchus or lung: Secondary | ICD-10-CM

## 2019-02-19 DIAGNOSIS — C19 Malignant neoplasm of rectosigmoid junction: Secondary | ICD-10-CM

## 2019-02-19 NOTE — Telephone Encounter (Signed)
Pt is refusing appt and wants to wait to see Dr Grayland Ormond

## 2019-02-19 NOTE — Telephone Encounter (Signed)
FYI

## 2019-02-20 NOTE — Progress Notes (Signed)
[name] [name] returns s/p L VATS/MINI THORACOTOMY and LULobectomy on 02/03/19.  Diet and appetite are normal.  He states he is doing well.  His mini thoracotomy incision is well healed with residual glue.  He has two sutures remaining in each of the two previous chest tube sites.  These were easily removed. They are irritated/moist.  He had been instructed to leave bandages in place by a "wound care nurse"  I instructed him to leave the areas open to air.One of the sites is slightly gaping.  He does have healing tape burns.  He will return as scheduled with a CXR.

## 2019-02-20 NOTE — Telephone Encounter (Signed)
Patient notified

## 2019-02-20 NOTE — Telephone Encounter (Signed)
Please let patient follow up with Dr. Grayland Ormond and decide from there. As per my last note, he can call if he also needs follow up with our clinic. Thank you.

## 2019-02-21 NOTE — Progress Notes (Signed)
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    ASSESSMENT: Recurrent, stage IV rectal adenocarcinoma.  PLAN:    1.  Recurrent, stage IV rectal adenocarcinoma: Patient was initially considered a clinical stage IIIa, but after completing neoadjuvant 5-FU and XRT followed by surgical excision on October 08, 2013 he was noted to be pathologic stage Ia.  He declined adjuvant FOLFOX at that time.  More recently, patient's CEA started trending up and CT of the chest revealed a large 7.7 cm left upper lobe mass biopsy consistent with rectal adenocarcinoma.  Patient completed cycle 6 of FOLFOX plus Avastin on October 16, 2018.  Repeat biopsy did not reveal any evidence of malignancy, but persistent fungal infection.  Patient underwent repeat bronchoscopy on December 30, 2018 which did in fact reveal residual malignancy.  He subsequently underwent surgical resection on February 03, 2019 with complete removal of residual disease.  One lymph node had a "fragment" of malignancy.  Patient does not require additional chemotherapy, but will discuss case with radiation oncology to see if adjuvant XRT is warranted given the fragment of malignancy in one lymph node.  Return to clinic in 3  months with repeat imaging and further evaluation.   2.  Leukopenia: Resolved. 3.  Cough: Chronic and unchanged.  Continue Tessalon Perles as needed. 4.  Insomnia: Patient does not complain of this today.  Continue Ambien as needed.  I provided  30 minutes of face-to-face video visit time during this encounter which included chart review, counseling, and coordination of care as documented above.  Patient expressed understanding and was in agreement with this plan. He also understands that He can call clinic at any time with any questions, concerns, or complaints.    Lloyd Huger, MD   02/28/2019 11:25 AM

## 2019-02-26 ENCOUNTER — Other Ambulatory Visit: Payer: Self-pay | Admitting: Cardiothoracic Surgery

## 2019-02-26 DIAGNOSIS — R918 Other nonspecific abnormal finding of lung field: Secondary | ICD-10-CM

## 2019-02-27 ENCOUNTER — Encounter: Payer: Self-pay | Admitting: Cardiothoracic Surgery

## 2019-02-27 ENCOUNTER — Encounter: Payer: Self-pay | Admitting: Oncology

## 2019-02-27 ENCOUNTER — Other Ambulatory Visit: Payer: Self-pay

## 2019-02-27 ENCOUNTER — Ambulatory Visit (INDEPENDENT_AMBULATORY_CARE_PROVIDER_SITE_OTHER): Payer: Self-pay | Admitting: Cardiothoracic Surgery

## 2019-02-27 ENCOUNTER — Ambulatory Visit
Admission: RE | Admit: 2019-02-27 | Discharge: 2019-02-27 | Disposition: A | Discharge status: Home / Self Care | Payer: 59 | Source: Ambulatory Visit | Attending: Cardiothoracic Surgery | Admitting: Cardiothoracic Surgery

## 2019-02-27 VITALS — BP 141/81 | HR 92 | Temp 97.9°F | Resp 20 | Ht 75.0 in | Wt 257.0 lb

## 2019-02-27 DIAGNOSIS — R918 Other nonspecific abnormal finding of lung field: Secondary | ICD-10-CM | POA: Diagnosis not present

## 2019-02-27 DIAGNOSIS — C19 Malignant neoplasm of rectosigmoid junction: Secondary | ICD-10-CM

## 2019-02-27 NOTE — Progress Notes (Signed)
[address]       [place] [address]             [phone]                  Burton L Dickerman [place] Medical Record #[id number] Date of Birth: 01-20-1956  Referring DP:OEUM, Christia Reading, MD Primary Cardiology: Primary Care:Boyce, Astrid Divine, FNP  Chief Complaint:  Follow Up Visit OPERATIVE REPORT DATE OF PROCEDURE:  02/03/2019 PREOPERATIVE DIAGNOSIS:  Left upper lobe lung mass, question isolated pulmonary metastasis from rectal carcinoma, question fungus ball. POSTOPERATIVE DIAGNOSIS:  Left upper lobe lung mass,final path and micro pending. SURGICAL PROCEDURE:  Bronchoscopy, left video-assisted thoracoscopy, mini thoracotomy, left upper lobectomy with lymph node dissection, intercostal nerve block with Exparel.  Cancer Staging Rectal cancer [facility]) Staging form: Colon and Rectum, AJCC 7th Edition - Clinical stage from 08/05/2014: Stage IVA (T2, N0, M1a) - Signed by Lloyd Huger, MD on 07/06/2018   History of Present Illness:     Patient returns to the office today in follow-up after left upper lobe resection for an isolated pulmonary metastasis of rectal mucin producing carcinoma.  The patient is making good recovery since discharge home.  His postop course was complicated by significant ileus/partial small bowel obstruction at ultimately resolved with conservative therapy.  Notes he is gaining strength, increasing his physical activity, denies significant shortness of breath.    Social History   Tobacco Use  Smoking Status Never Smoker  Smokeless Tobacco Current User  . Types: Chew  Tobacco Comment   might smoke a cigar once every 3 months       Allergies  Allergen Reactions  . Sulfa Antibiotics Hives  . Morphine And Related Other (See Comments)    Altered mental status    Current Outpatient Medications  Medication Sig Dispense Refill  . albuterol (VENTOLIN HFA) 108 (90 Base) MCG/ACT inhaler Inhale 2 puffs into the lungs every 6 (six)  hours as needed for wheezing or shortness of breath. 18 g 1  . amLODipine (NORVASC) 10 MG tablet Take 1 tablet (10 mg total) by mouth daily. 30 tablet 1  . aspirin-acetaminophen-caffeine (EXCEDRIN MIGRAINE) 353-614-43 MG tablet Take 2 tablets by mouth every 6 (six) hours as needed for headache.    [name] aspirin-sod bicarb-citric acid (ALKA-SELTZER) 325 MG TBEF tablet Take 650 mg by mouth every 6 (six) hours as needed (heartburn).    . cetirizine (ZYRTEC) 10 MG tablet Take 10 mg by mouth at bedtime.     [name] Dextromethorphan-guaiFENesin (MUCINEX DM MAXIMUM STRENGTH) 60-1200 MG TB12 Take 1 tablet by mouth at bedtime as needed (congestion/drainage.).    [name] diclofenac Sodium (VOLTAREN) 1 % GEL Apply 1 application topically 4 (four) times daily as needed (knee pain).    [name] loperamide (IMODIUM A-D) 2 MG capsule Take 4 mg by mouth as needed for diarrhea or loose stools.     . Multiple Vitamin (MULTIVITAMIN WITH MINERALS) TABS tablet Take 1 tablet by mouth every evening.     [name] Naphazoline-Pheniramine (VISINE-A OP) Place 1 drop into both eyes daily as needed (allergies).    . ondansetron (ZOFRAN) 8 MG tablet Take 1 tablet (8 mg total) by mouth 2 (two) times daily as needed for refractory nausea / vomiting. 30 tablet 2  . Oxymetazoline HCl (AFRIN EXTRA MOISTURIZING NA) Place 1 spray into the nose 2 (two) times daily as needed (congestion.).    [name] tadalafil (ADCIRCA/CIALIS) 20 MG tablet Take 1  tablet (20 mg total) by mouth daily as needed for erectile dysfunction. 6 tablet 10  . timolol (BETIMOL) 0.5 % ophthalmic solution Place 1 drop into the right eye at bedtime.     . chlorpheniramine-HYDROcodone (TUSSIONEX PENNKINETIC ER) 10-8 MG/5ML SUER Take 5 mLs by mouth every 8 (eight) hours as needed for cough. (Patient not taking: Reported on 02/20/2019) 115 mL 0   No current facility-administered medications for this visit.       Physical Exam: BP (!) 141/81 (BP Location: Right Arm, Patient Position: Sitting, Cuff Size:  Normal)   Pulse 92   Temp 97.9 F (36.6 C) (Skin)   Resp 20   Ht 6\' 3"  (1.905 m)   Wt 257 lb (116.6 kg)   SpO2 95% Comment: RA  BMI 32.12 kg/m   General appearance: alert, cooperative and no distress Neurologic: intact Heart: regular rate and rhythm, S1, S2 normal, no murmur, click, rub or gallop Lungs: clear to auscultation bilaterally Abdomen: soft, non-tender; bowel sounds normal; no masses,  no organomegaly Extremities: extremities normal, atraumatic, no cyanosis or edema and Homans sign is negative, no sign of DVT Wound: Not chest incisions port sites are all healing well without evidence of infection, small amount of eschar on largest of the incisions   Diagnostic Studies & Laboratory data:         Recent Radiology Findings: DG Chest 2 View  Result Date: 02/27/2019 CLINICAL DATA:  Left lung mass. EXAM: CHEST - 2 VIEW COMPARISON:  02/14/2019. FINDINGS: PowerPort catheter noted stable position. Heart size stable. Postsurgical changes left lung again noted with stable mild left base atelectasis and pleuroparenchymal thickening. No pneumothorax identified on today's exam. IMPRESSION: 1.  PowerPort catheter stable position. 2. Postsurgical changes left lung again noted with stable mild left base atelectasis and pleuroparenchymal thickening. No pneumothorax noted on today's exam. Electronically Signed   By: Marcello Moores  Register   On: 02/27/2019 08:52      I have independently reviewed the above radiology findings and reviewed findings  with the patient.  Recent Labs: Lab Results  Component Value Date   WBC 5.3 02/13/2019   HGB 11.4 (L) 02/13/2019   HCT 35.5 (L) 02/13/2019   PLT 477 (H) 02/13/2019   GLUCOSE 102 (H) 02/14/2019   CHOL 147 03/22/2018   TRIG 126 03/22/2018   HDL 35 (L) 03/22/2018   LDLCALC 89 03/22/2018   ALT 31 02/07/2019   AST 33 02/07/2019   NA 135 02/14/2019   K 3.8 02/14/2019   CL 104 02/14/2019   CREATININE 0.92 02/14/2019   BUN 15 02/14/2019   CO2 22  02/14/2019   TSH 3.96 07/03/2016   INR 0.9 01/30/2019   Pathology: FINAL MICROSCOPIC DIAGNOSIS:   A. LYMPH NODE, 5, BIOPSY:  - One lymph node, negative for carcinoma (0/1).   B. LYMPH NODE, 10L, BIOPSY:  - One lymph node, negative for carcinoma (0/1).   C. LUNG, LEFT UPPER LOBE, LOBECTOMY:  - Adenocarcinoma with extracellular mucin, 10.3 cm.  - Margins of resection are not involved.  - See comment.   D. LYMPH NODE, 11L, BIOPSY:  - One lymph node, negative for carcinoma (0/1).   E. LYMPH NODE, 11L #2, BIOPSY:  - One lymph node, negative for carcinoma (0/1).   F. LYMPH NODE, 11L #3, BIOPSY:  - Fragment of adenocarcinoma.  - Detached fragment of fibroadipose tissue.  - No distinct nodal tissue identified.   G. LYMPH NODE, 12L, BIOPSY:  - One lymph node, negative for  carcinoma (0/1).   H. LYMPH NODE, 4L, BIOPSY:  - One lymph node, negative for carcinoma (0/1).   I. LYMPH NODE, 8, BIOPSY:  - One lymph node, negative for carcinoma (0/1).   COMMENT:   C. By morphology and review of the record, the findings are compatible  with the provided clinical history of colorectal carcinoma.   INTRAOPERATIVE DIAGNOSIS:    C. Left upper lobe bronchial margin: "Negative for neoplasm."  Intraoperative diagnosis rendered by Dr. Jeannie Done at 3:20 PM on  02/03/2019.   GROSS DESCRIPTION:   A: Received fresh is a 0.8 cm anthracotic lymph node. Submitted in toto  in 1 cassette.   B: Received fresh is a 1.2 cm anthracotic lymph node. Submitted in toto  in 1 cassette.   C: Specimen: Left upper lobectomy, received fresh for rapid  intraoperative consult.  Specimen integrity: The pleura is previously incised.  Size, weight: 23.5 x 15.2 x 8.7 cm, 499 g.  Pleura: The pleura is previously incised and there is mucinous material  exuding from the incision sites.  Lesion: The cut surface shows a 10.3 x 8.2 x 6.8 cm well-circumscribed  mass. The cut surface is tan-white and mucinous. The  mass extends to  within 1 cm of the bronchial margin.  Margins: The bronchial margin is grossly free of tumor and is submitted  for frozen section.  Hilar vessels: Grossly free of tumor.  Nonneoplastic parenchyma: Spongy, red-brown.  Lymph nodes: None.  Block Summary: 9 blocks submitted.  1 = bronchial margin submitted for frozen section.  2 = vascular margins.  3-8 = tumor.  9 = uninvolved parenchyma.   D: Received fresh is a 1.7 cm anthracotic lymph node. Sectioned and  entirely submitted in 1 cassette.   E: Received fresh is a 0.7 cm anthracotic lymph node. Bisected and  entirely submitted in 1 cassette.   F: Received fresh are two fragments of tan-pink soft tissue, each 0.4  cm. Submitted in toto in 1 cassette.   G: Received fresh is a 0.7 cm anthracotic lymph node. Submitted in toto  in 1 cassette.   H: Received fresh is a 1.1 cm anthracotic lymph node. Sectioned and  entirely submitted in 1 cassette.   I: Received fresh is a 1 cm anthracotic lymph node. Submitted in toto  in 1 cassette. [facility] 02/04/2019)    Assessment / Plan:   Patient doing well after left upper lobectomy for isolated pulmonary metastasis from rectal  Carcinoma.  Left upper lobectomy was completed with negative margins.  One fragment of lymph node 11 showed carcinoma.  Total 10.3 cm in size.  Patient is doing well postoperatively.  He notes that he has a postop visit with Dr. Grayland Ormond tomorrow to discuss further therapy versus intensive surveillance.  Patient will gradually increase his physical activities as tolerated, avoiding heavy lifting We'll plan to see him back in 5 to 6 weeks with a follow-up chest x-ray.    Medication Changes: No orders of the defined types were placed in this encounter.     Grace Isaac 02/27/2019 9:23 AM

## 2019-02-28 ENCOUNTER — Inpatient Hospital Stay: Payer: 59 | Attending: Oncology | Admitting: Oncology

## 2019-02-28 DIAGNOSIS — C20 Malignant neoplasm of rectum: Secondary | ICD-10-CM

## 2019-03-06 ENCOUNTER — Other Ambulatory Visit: Payer: 59

## 2019-03-06 NOTE — Progress Notes (Signed)
Tumor Board Documentation  [name] [name] was presented by Dr Grayland Ormond at our Tumor Board on 03/06/2019, which included representatives from medical oncology, radiation oncology, navigation, pathology, radiology, surgical, surgical oncology, internal medicine, pharmacy, genetics, palliative care, research.  [name] [name] currently presents as a current patient, for [name] [name] with history of the following treatments: adjuvant chemotherapy, surgical intervention(s).  Additionally, we reviewed previous medical and familial history, history of present illness, and recent lab results along with all available histopathologic and imaging studies. The tumor board considered available treatment options and made the following recommendations: Active surveillance    The following procedures/referrals were also placed: No orders of the defined types were placed in this encounter.   Clinical Trial Status: not discussed   Staging used: AJCC Stage Group  AJCC Staging:       Group: Stage 4 Rectal Cacner   National site-specific guidelines NCCN were discussed with respect to the case.  Tumor board is a meeting of clinicians from various specialty areas who evaluate and discuss patients for whom a multidisciplinary approach is being considered. Final determinations in the plan of care are those of the provider(s). The responsibility for follow up of recommendations given during tumor board is that of the provider.   Today's extended care, comprehensive team conference, [name] [name] was not present for the discussion and was not examined.   Multidisciplinary Tumor Board is a multidisciplinary case peer review process.  Decisions discussed in the Multidisciplinary Tumor Board reflect the opinions of the specialists present at the conference without having examined the patient.  Ultimately, treatment and diagnostic decisions rest with the primary provider(s) and the patient.

## 2019-03-09 LAB — FUNGUS CULTURE WITH STAIN

## 2019-03-09 LAB — FUNGAL ORGANISM REFLEX

## 2019-03-09 LAB — FUNGUS CULTURE RESULT

## 2019-03-10 ENCOUNTER — Ambulatory Visit: Payer: 59 | Attending: Internal Medicine

## 2019-03-10 ENCOUNTER — Other Ambulatory Visit: Payer: Self-pay

## 2019-03-10 DIAGNOSIS — Z23 Encounter for immunization: Secondary | ICD-10-CM | POA: Insufficient documentation

## 2019-03-10 NOTE — Progress Notes (Signed)
   Covid-19 Vaccination Clinic  Name:  TERRANCE USERY    MRN: [id number] DOB: 10-09-1955  03/10/2019  Mr. Huwe was observed post Covid-19 immunization for 15 minutes without incidence. He was provided with Vaccine Information Sheet and instruction to access the V-Safe system.   Mr. Dralle was instructed to call 911 with any severe reactions post vaccine: [name] Difficulty breathing  . Swelling of your face and throat  . A fast heartbeat  . A bad rash all over your body  . Dizziness and weakness    Immunizations Administered    Name Date Dose VIS Date Route   Pfizer COVID-19 Vaccine 03/10/2019 10:42 AM 0.3 mL 01/03/2019 Intramuscular   Manufacturer: [place]   Lot: [id number]   [place]: [address]-2

## 2019-03-19 LAB — ACID FAST CULTURE WITH REFLEXED SENSITIVITIES (MYCOBACTERIA): Acid Fast Culture: NEGATIVE

## 2019-03-25 ENCOUNTER — Encounter: Payer: Self-pay | Admitting: Urology

## 2019-03-26 ENCOUNTER — Ambulatory Visit: Payer: 59

## 2019-04-08 ENCOUNTER — Other Ambulatory Visit: Payer: Self-pay | Admitting: Cardiothoracic Surgery

## 2019-04-08 DIAGNOSIS — B49 Unspecified mycosis: Secondary | ICD-10-CM

## 2019-04-09 ENCOUNTER — Ambulatory Visit: Payer: 59 | Attending: Internal Medicine

## 2019-04-09 DIAGNOSIS — Z23 Encounter for immunization: Secondary | ICD-10-CM

## 2019-04-09 NOTE — Progress Notes (Signed)
   Covid-19 Vaccination Clinic  Name:  [name] [name]    MRN: [id number] DOB: 03/24/1955  04/09/2019  [name] was observed post Covid-19 immunization for 15 minutes without incident. He was provided with Vaccine Information Sheet and instruction to access the V-Safe system.   [name] was instructed to call 911 with any severe reactions post vaccine: [name] Difficulty breathing  . Swelling of face and throat  . A fast heartbeat  . A bad rash all over body  . Dizziness and weakness   Immunizations Administered    Name Date Dose VIS Date Route   Pfizer COVID-19 Vaccine 04/09/2019 11:31 AM 0.3 mL 01/03/2019 Intramuscular   Manufacturer: [place]   Lot: [id number]   [place]: [id number]

## 2019-04-10 ENCOUNTER — Ambulatory Visit
Admission: RE | Admit: 2019-04-10 | Discharge: 2019-04-10 | Disposition: A | Discharge status: Home / Self Care | Payer: 59 | Source: Ambulatory Visit | Attending: Cardiothoracic Surgery | Admitting: Cardiothoracic Surgery

## 2019-04-10 ENCOUNTER — Ambulatory Visit (INDEPENDENT_AMBULATORY_CARE_PROVIDER_SITE_OTHER): Payer: Self-pay | Admitting: Cardiothoracic Surgery

## 2019-04-10 ENCOUNTER — Encounter: Payer: Self-pay | Admitting: Cardiothoracic Surgery

## 2019-04-10 ENCOUNTER — Other Ambulatory Visit: Payer: Self-pay

## 2019-04-10 VITALS — BP 133/80 | HR 99 | Temp 97.7°F | Resp 16 | Ht 75.0 in | Wt 257.0 lb

## 2019-04-10 DIAGNOSIS — C3432 Malignant neoplasm of lower lobe, left bronchus or lung: Secondary | ICD-10-CM

## 2019-04-10 DIAGNOSIS — Z902 Acquired absence of lung [part of]: Secondary | ICD-10-CM

## 2019-04-10 DIAGNOSIS — J9811 Atelectasis: Secondary | ICD-10-CM | POA: Diagnosis not present

## 2019-04-10 DIAGNOSIS — B49 Unspecified mycosis: Secondary | ICD-10-CM

## 2019-04-10 NOTE — Progress Notes (Signed)
[address]       [place] [address]             [phone]                  [place] Medical Record #[id number] Date of Birth: 05-19-1955  Referring CB:SWHQ, Christia Reading, MD Primary Cardiology: Primary Care:Boyce, Astrid Divine, FNP Oncology: Dr. Lilian Kapur  Chief Complaint:  Follow Up Visit OPERATIVE REPORT DATE OF PROCEDURE:  02/03/2019 PREOPERATIVE DIAGNOSIS:  Left upper lobe lung mass, question isolated pulmonary metastasis from rectal carcinoma, question fungus ball. POSTOPERATIVE DIAGNOSIS:  Left upper lobe lung mass,final path and micro pending. SURGICAL PROCEDURE:  Bronchoscopy, left video-assisted thoracoscopy, mini thoracotomy, left upper lobectomy with lymph node dissection, intercostal nerve block with Exparel.  Cancer Staging Rectal cancer [facility]) Staging form: Colon and Rectum, AJCC 7th Edition - Clinical stage from 08/05/2014: Stage IVA (T2, N0, M1a) - Signed by Lloyd Huger, MD on 07/06/2018   History of Present Illness:     Patient returns to the office today in follow-up after left upper lobe resection for an isolated pulmonary metastasis of rectal mucin producing carcinoma.  He notes that he is returned to near full level of activities.  He denies any significant shortness of breath.  No hemoptysis.  He notes that his seen Dr. Grayland Ormond and Dr. Donella Stade and currently is under observation only with a follow-up CT scan ordered in May.   Social History   Tobacco Use  Smoking Status Never Smoker  Smokeless Tobacco Current User  . Types: Chew  Tobacco Comment   might smoke a cigar once every 3 months       Allergies  Allergen Reactions  . Sulfa Antibiotics Hives  . Morphine And Related Other (See Comments)    Altered mental status    Current Outpatient Medications  Medication Sig Dispense Refill  . albuterol (VENTOLIN HFA) 108 (90 Base) MCG/ACT inhaler Inhale 2 puffs into the lungs every 6 (six) hours as needed for  wheezing or shortness of breath. 18 g 1  . amLODipine (NORVASC) 10 MG tablet Take 1 tablet (10 mg total) by mouth daily. 30 tablet 1  . aspirin-acetaminophen-caffeine (EXCEDRIN MIGRAINE) 759-163-84 MG tablet Take 2 tablets by mouth every 6 (six) hours as needed for headache.    [name] aspirin-sod bicarb-citric acid (ALKA-SELTZER) 325 MG TBEF tablet Take 650 mg by mouth every 6 (six) hours as needed (heartburn).    . cetirizine (ZYRTEC) 10 MG tablet Take 10 mg by mouth at bedtime.     . diclofenac Sodium (VOLTAREN) 1 % GEL Apply 1 application topically 4 (four) times daily as needed (knee pain).    [name] loperamide (IMODIUM A-D) 2 MG capsule Take 4 mg by mouth as needed for diarrhea or loose stools.     . Multiple Vitamin (MULTIVITAMIN WITH MINERALS) TABS tablet Take 1 tablet by mouth every evening.     [name] Naphazoline-Pheniramine (VISINE-A OP) Place 1 drop into both eyes daily as needed (allergies).    . Oxymetazoline HCl (AFRIN EXTRA MOISTURIZING NA) Place 1 spray into the nose 2 (two) times daily as needed (congestion.).    [name] tadalafil (ADCIRCA/CIALIS) 20 MG tablet Take 1 tablet (20 mg total) by mouth daily as needed for erectile dysfunction. 6 tablet 10  . timolol (BETIMOL) 0.5 % ophthalmic solution Place 1 drop into the right eye at bedtime.      No current facility-administered medications for  this visit.       Physical Exam: BP 133/80 (BP Location: Left Arm, Patient Position: Sitting, Cuff Size: Large)   Pulse 99   Temp 97.7 F (36.5 C)   Resp 16   Ht 6\' 3"  (1.905 m)   Wt 116.6 kg   SpO2 95%   BMI 32.12 kg/m   General appearance: alert and cooperative Head: Normocephalic, without obvious abnormality, atraumatic Neck: no adenopathy, no carotid bruit, no JVD, supple, symmetrical, trachea midline and thyroid not enlarged, symmetric, no tenderness/mass/nodules Lymph nodes: Cervical, supraclavicular, and axillary nodes normal. Resp: clear to auscultation bilaterally Cardio: regular rate and  rhythm, S1, S2 normal, no murmur, click, rub or gallop GI: soft, non-tender; bowel sounds normal; no masses,  no organomegaly and Colostomy intact Extremities: extremities normal, atraumatic, no cyanosis or edema Neurologic: Grossly normal Left chest incisions are well-healed   Diagnostic Studies & Laboratory data:         Recent Radiology Findings: DG Chest 2 View  Result Date: 04/10/2019 CLINICAL DATA:  History of colon carcinoma with prior left lobectomy for mass EXAM: CHEST - 2 VIEW COMPARISON:  February 27, 2019 and February 03, 2019 FINDINGS: Port-A-Cath tip is in the superior vena cava. No pneumothorax. Postoperative changes noted on the left. There is minimal atelectasis in the left base. The lungs elsewhere are clear. Heart size and pulmonary vascular normal. No adenopathy. No bone lesions. IMPRESSION: No recurrent mass on the left. Postoperative change with slight left base atelectasis present. Lungs elsewhere clear. Cardiac silhouette normal. No evident adenopathy. Central catheter tip in superior vena cava without pneumothorax. Electronically Signed   By: Lowella Grip III M.D.   On: 04/10/2019 09:23      I have independently reviewed the above radiology findings and reviewed findings  with the patient.  Recent Labs: Lab Results  Component Value Date   WBC 5.3 02/13/2019   HGB 11.4 (L) 02/13/2019   HCT 35.5 (L) 02/13/2019   PLT 477 (H) 02/13/2019   GLUCOSE 102 (H) 02/14/2019   CHOL 147 03/22/2018   TRIG 126 03/22/2018   HDL 35 (L) 03/22/2018   LDLCALC 89 03/22/2018   ALT 31 02/07/2019   AST 33 02/07/2019   NA 135 02/14/2019   K 3.8 02/14/2019   CL 104 02/14/2019   CREATININE 0.92 02/14/2019   BUN 15 02/14/2019   CO2 22 02/14/2019   TSH 3.96 07/03/2016   INR 0.9 01/30/2019   Pathology: FINAL MICROSCOPIC DIAGNOSIS:   A. LYMPH NODE, 5, BIOPSY:  - One lymph node, negative for carcinoma (0/1).   B. LYMPH NODE, 10L, BIOPSY:  - One lymph node, negative for  carcinoma (0/1).   C. LUNG, LEFT UPPER LOBE, LOBECTOMY:  - Adenocarcinoma with extracellular mucin, 10.3 cm.  - Margins of resection are not involved.  - See comment.   D. LYMPH NODE, 11L, BIOPSY:  - One lymph node, negative for carcinoma (0/1).   E. LYMPH NODE, 11L #2, BIOPSY:  - One lymph node, negative for carcinoma (0/1).   F. LYMPH NODE, 11L #3, BIOPSY:  - Fragment of adenocarcinoma.  - Detached fragment of fibroadipose tissue.  - No distinct nodal tissue identified.   G. LYMPH NODE, 12L, BIOPSY:  - One lymph node, negative for carcinoma (0/1).   H. LYMPH NODE, 4L, BIOPSY:  - One lymph node, negative for carcinoma (0/1).   I. LYMPH NODE, 8, BIOPSY:  - One lymph node, negative for carcinoma (0/1).   COMMENT:   C.  By morphology and review of the record, the findings are compatible  with the provided clinical history of colorectal carcinoma.   INTRAOPERATIVE DIAGNOSIS:    C. Left upper lobe bronchial margin: "Negative for neoplasm."  Intraoperative diagnosis rendered by Dr. Jeannie Done at 3:20 PM on  02/03/2019.   GROSS DESCRIPTION:   A: Received fresh is a 0.8 cm anthracotic lymph node. Submitted in toto  in 1 cassette.   B: Received fresh is a 1.2 cm anthracotic lymph node. Submitted in toto  in 1 cassette.   C: Specimen: Left upper lobectomy, received fresh for rapid  intraoperative consult.  Specimen integrity: The pleura is previously incised.  Size, weight: 23.5 x 15.2 x 8.7 cm, 499 g.  Pleura: The pleura is previously incised and there is mucinous material  exuding from the incision sites.  Lesion: The cut surface shows a 10.3 x 8.2 x 6.8 cm well-circumscribed  mass. The cut surface is tan-white and mucinous. The mass extends to  within 1 cm of the bronchial margin.  Margins: The bronchial margin is grossly free of tumor and is submitted  for frozen section.  Hilar vessels: Grossly free of tumor.  Nonneoplastic parenchyma: Spongy, red-brown.  Lymph  nodes: None.  Block Summary: 9 blocks submitted.  1 = bronchial margin submitted for frozen section.  2 = vascular margins.  3-8 = tumor.  9 = uninvolved parenchyma.   D: Received fresh is a 1.7 cm anthracotic lymph node. Sectioned and  entirely submitted in 1 cassette.   E: Received fresh is a 0.7 cm anthracotic lymph node. Bisected and  entirely submitted in 1 cassette.   F: Received fresh are two fragments of tan-pink soft tissue, each 0.4  cm. Submitted in toto in 1 cassette.   G: Received fresh is a 0.7 cm anthracotic lymph node. Submitted in toto  in 1 cassette.   H: Received fresh is a 1.1 cm anthracotic lymph node. Sectioned and  entirely submitted in 1 cassette.   I: Received fresh is a 1 cm anthracotic lymph node. Submitted in toto  in 1 cassette. [facility] 02/04/2019)    Assessment / Plan:   #1 status post left upper lobectomy for mucin producing rectal carcinoma metastatic to the lung with isolated metastasis-patient doing well postoperatively consensus of tumor board at [place] was to continue with observation.  Patient scheduled for follow-up CT scan in May of this year #2 hypertension the patient had a history of borderline hypertension prior to surgery-during the postoperative period he was started on calcium channel blocker and continues on this he will have follow-up with primary care for goals of hypertension management and renewal of medication  #3 plan see the patient back postop about 4 months-   Medication Changes: No orders of the defined types were placed in this encounter.     Grace Isaac 04/10/2019 10:03 AM

## 2019-04-13 ENCOUNTER — Encounter: Payer: Self-pay | Admitting: Cardiothoracic Surgery

## 2019-04-16 ENCOUNTER — Other Ambulatory Visit: Payer: Self-pay | Admitting: *Deleted

## 2019-04-16 DIAGNOSIS — I10 Essential (primary) hypertension: Secondary | ICD-10-CM

## 2019-04-16 MED ORDER — AMLODIPINE BESYLATE 10 MG PO TABS: 10.0000 mg | ORAL_TABLET | Freq: Every day | ORAL | 3 refills | Status: DC

## 2019-05-11 ENCOUNTER — Encounter: Payer: Self-pay | Admitting: Urology

## 2019-05-16 ENCOUNTER — Encounter: Payer: Self-pay | Admitting: Cardiothoracic Surgery

## 2019-05-16 ENCOUNTER — Encounter: Payer: Self-pay | Admitting: Oncology

## 2019-05-26 ENCOUNTER — Other Ambulatory Visit: Payer: Self-pay

## 2019-05-26 ENCOUNTER — Inpatient Hospital Stay: Payer: 59 | Attending: Oncology

## 2019-05-26 ENCOUNTER — Ambulatory Visit
Admission: RE | Admit: 2019-05-26 | Discharge: 2019-05-26 | Disposition: A | Discharge status: Home / Self Care | Payer: 59 | Source: Ambulatory Visit | Attending: Oncology | Admitting: Oncology

## 2019-05-26 DIAGNOSIS — C7802 Secondary malignant neoplasm of left lung: Secondary | ICD-10-CM | POA: Insufficient documentation

## 2019-05-26 DIAGNOSIS — Z95828 Presence of other vascular implants and grafts: Secondary | ICD-10-CM

## 2019-05-26 DIAGNOSIS — Z9221 Personal history of antineoplastic chemotherapy: Secondary | ICD-10-CM | POA: Insufficient documentation

## 2019-05-26 DIAGNOSIS — R05 Cough: Secondary | ICD-10-CM | POA: Insufficient documentation

## 2019-05-26 DIAGNOSIS — C20 Malignant neoplasm of rectum: Secondary | ICD-10-CM | POA: Insufficient documentation

## 2019-05-26 DIAGNOSIS — C7800 Secondary malignant neoplasm of unspecified lung: Secondary | ICD-10-CM | POA: Diagnosis not present

## 2019-05-26 DIAGNOSIS — G4700 Insomnia, unspecified: Secondary | ICD-10-CM | POA: Insufficient documentation

## 2019-05-26 DIAGNOSIS — Z923 Personal history of irradiation: Secondary | ICD-10-CM | POA: Insufficient documentation

## 2019-05-26 LAB — COMPREHENSIVE METABOLIC PANEL
ALT: 19 U/L (ref 0–44)
AST: 18 U/L (ref 15–41)
Albumin: 4.1 g/dL (ref 3.5–5.0)
Alkaline Phosphatase: 65 U/L (ref 38–126)
Anion gap: 8 (ref 5–15)
BUN: 18 mg/dL (ref 8–23)
CO2: 25 mmol/L (ref 22–32)
Calcium: 8.9 mg/dL (ref 8.9–10.3)
Chloride: 105 mmol/L (ref 98–111)
Creatinine, Ser: 0.79 mg/dL (ref 0.61–1.24)
GFR calc Af Amer: >60 mL/min (ref >60)
GFR calc non Af Amer: >60 mL/min (ref >60)
Glucose, Bld: 100 mg/dL — ABNORMAL HIGH (ref 70–99)
Potassium: 4 mmol/L (ref 3.5–5.1)
Sodium: 138 mmol/L (ref 135–145)
Total Bilirubin: 0.8 mg/dL (ref 0.3–1.2)
Total Protein: 7 g/dL (ref 6.5–8.1)

## 2019-05-26 LAB — CBC WITH DIFFERENTIAL/PLATELET
Abs Immature Granulocytes: 0.02 10*3/uL (ref 0.00–0.07)
Basophils Absolute: 0 10*3/uL (ref 0.0–0.1)
Basophils Relative: 1 %
Eosinophils Absolute: 0.2 10*3/uL (ref 0.0–0.5)
Eosinophils Relative: 7 %
HCT: 39.1 % (ref 39.0–52.0)
Hemoglobin: 12.8 g/dL — ABNORMAL LOW (ref 13.0–17.0)
Immature Granulocytes: 1 %
Lymphocytes Relative: 23 %
Lymphs Abs: 0.8 10*3/uL (ref 0.7–4.0)
MCH: 28.8 pg (ref 26.0–34.0)
MCHC: 32.7 g/dL (ref 30.0–36.0)
MCV: 88.1 fL (ref 80.0–100.0)
Monocytes Absolute: 0.4 10*3/uL (ref 0.1–1.0)
Monocytes Relative: 12 %
Neutro Abs: 2 10*3/uL (ref 1.7–7.7)
Neutrophils Relative %: 56 %
Platelets: 312 10*3/uL (ref 150–400)
RBC: 4.44 MIL/uL (ref 4.22–5.81)
RDW: 14.9 % (ref 11.5–15.5)
WBC: 3.4 10*3/uL — ABNORMAL LOW (ref 4.0–10.5)
nRBC: 0 % (ref 0.0–0.2)

## 2019-05-26 LAB — PSA: Prostatic Specific Antigen: 2.56 ng/mL (ref 0.00–4.00)

## 2019-05-26 MED ORDER — IOHEXOL 300 MG/ML  SOLN
75.0000 mL | Freq: Once | INTRAMUSCULAR | Status: AC | PRN
  Administered 2019-05-26: 75 mL via INTRAVENOUS

## 2019-05-26 MED ORDER — SODIUM CHLORIDE 0.9% FLUSH
10.0000 mL | INTRAVENOUS | Status: DC | PRN
  Administered 2019-05-26: 10 mL via INTRAVENOUS
  Filled 2019-05-26: qty 10

## 2019-05-26 MED ORDER — HEPARIN SOD (PORK) LOCK FLUSH 100 UNIT/ML IV SOLN
500.0000 [IU] | Freq: Once | INTRAVENOUS | Status: AC
  Administered 2019-05-26: 12:00:00 500 [IU] via INTRAVENOUS
  Filled 2019-05-26: qty 5

## 2019-05-27 LAB — TESTOSTERONE: Testosterone: 176 ng/dL — ABNORMAL LOW (ref 264–916)

## 2019-05-29 ENCOUNTER — Ambulatory Visit: Payer: 59 | Admitting: Oncology

## 2019-05-30 NOTE — Progress Notes (Signed)
[place]  Telephone:(336) [phone] Fax:(336) [phone]  ID: Lugene Beougher Ballew OB: May 03, 1955  MR#: [id number]  XHB#:[id number]  Patient Care Team: Hubbard Hartshorn, FNP as PCP - General (Family Medicine) Marlyce Huge, MD as Attending Physician (Surgery) Lloyd Huger, MD as Consulting Physician (Oncology) Abbie Sons, MD (Urology) Eulogio Bear, MD as Consulting Physician (Ophthalmology) Flora Lipps, MD as Consulting Physician (Pulmonary Disease)   CHIEF COMPLAINT: Recurrent, stage IV rectal adenocarcinoma.  INTERVAL HISTORY: Patient returns to clinic today for further evaluation and discussion of his imaging results.  He has residual dyspnea on exertion, but otherwise is nearly fully recovered from his recent surgery.  He does not complain of weakness or fatigue today.  He has no neurologic complaints. He does not complain of peripheral neuropathy today.  He denies any recent fevers or illnesses.  He denies any chest pain, shortness of breath, or hemoptysis.  He denies nausea, vomiting, constipation, or diarrhea. He has noted no blood or melena in his colostomy bag.  He has no urinary complaints.  Patient offers no further specific complaints today.  REVIEW OF SYSTEMS:   Review of Systems  Constitutional: Negative for fever, malaise/fatigue and weight loss.  Respiratory: Negative.  Negative for cough and shortness of breath.   Cardiovascular: Negative.  Negative for chest pain and leg swelling.  Gastrointestinal: Negative.  Negative for abdominal pain, blood in stool, constipation, diarrhea and melena.  Genitourinary: Negative.  Negative for dysuria.  Musculoskeletal: Negative.  Negative for back pain.  Skin: Negative.  Negative for rash.  Neurological: Negative.  Negative for tingling, sensory change, focal weakness, weakness and headaches.  Psychiatric/Behavioral: Negative.  The patient is not nervous/anxious and does not have insomnia.      As per HPI. Otherwise, a complete review of systems is negative.  PAST MEDICAL HISTORY: Past Medical History:  Diagnosis Date  . Anemia   . Cancer [facility])    rectal 2015, followed by Oncology, permanent colostomy  . Colostomy in place [facility])   . Dyspnea    on exertion  . History of kidney stones   . Pneumonia   . Seasonal allergies     PAST SURGICAL HISTORY: Past Surgical History:  Procedure Laterality Date  . BACK SURGERY    . CHOLECYSTECTOMY    . COLON SURGERY    . ENDOBRONCHIAL ULTRASOUND N/A 07/01/2018   Procedure: ENDOBRONCHIAL ULTRASOUND;  Surgeon: Tyler Pita, MD;  Location: [facility];  Service: Cardiopulmonary;  Laterality: N/A;  . FLEXIBLE BRONCHOSCOPY Left 11/20/2018   Procedure: FLEXIBLE BRONCHOSCOPY;  Surgeon: Tyler Pita, MD;  Location: [facility];  Service: Cardiopulmonary;  Laterality: Left;  . INTERCOSTAL NERVE BLOCK Left 02/03/2019   Procedure: INTERCOSTAL NERVE BLOCK WITH EXPAREL;  Surgeon: Grace Isaac, MD;  Location: [place];  Service: Thoracic;  Laterality: Left;  . KNEE SURGERY Bilateral   . LOBECTOMY Left 02/03/2019   Procedure: LEFT UPPER LOBECTOMY WITH LYMPH NODE DISSECTION;  Surgeon: Grace Isaac, MD;  Location: [place];  Service: Thoracic;  Laterality: Left;  . PORT A CATH REVISION Left 08/05/2018   Procedure: PORT A CATH REVISION, REPOSITION LEFT;  Surgeon: Robert Bellow, MD;  Location: [facility];  Service: General;  Laterality: Left;  . PORT-A-CATH REMOVAL Right 12/02/2014   Procedure: REMOVAL PORT-A-CATH;  Surgeon: Marlyce Huge, MD;  Location: [facility];  Service: General;  Laterality: Right;  . PORTACATH PLACEMENT    . PORTACATH PLACEMENT Left 07/29/2018   Procedure: INSERTION PORT-A-CATH LEFT;  Surgeon: Robert Bellow, MD;  Location: [facility];  Service: General;  Laterality: Left;  . THORACOTOMY/LOBECTOMY Left 02/03/2019   Procedure: MINITHORACOTOMY;  Surgeon: Grace Isaac, MD;  Location: [place];  Service:  Thoracic;  Laterality: Left;  [name] VIDEO ASSISTED THORACOSCOPY (VATS)/WEDGE RESECTION Left 02/03/2019   Procedure: VIDEO ASSISTED THORACOSCOPY (VATS)/LUNG RESECTION;  Surgeon: Grace Isaac, MD;  Location: [place];  Service: Thoracic;  Laterality: Left;  [name] VIDEO BRONCHOSCOPY N/A 02/03/2019   Procedure: VIDEO BRONCHOSCOPY;  Surgeon: Grace Isaac, MD;  Location: [facility];  Service: Thoracic;  Laterality: N/A;  . VIDEO BRONCHOSCOPY WITH ENDOBRONCHIAL ULTRASOUND N/A 12/30/2018   Procedure: VIDEO BRONCHOSCOPY WITH ENDOBRONCHIAL ULTRASOUND;  Surgeon: Grace Isaac, MD;  Location: [place];  Service: Thoracic;  Laterality: N/A;    FAMILY HISTORY: Father with prostate cancer.  COPD, CHF.     ADVANCED DIRECTIVES:    HEALTH MAINTENANCE: Social History   Tobacco Use  . Smoking status: Never Smoker  . Smokeless tobacco: Current User    Types: Chew  . Tobacco comment: might smoke a cigar once every 3 months  Substance Use Topics  . Alcohol use: Yes    Alcohol/week: 0.0 standard drinks    Comment: RARE  . Drug use: No     Colonoscopy:  PAP:  Bone density:  Lipid panel:  Allergies  Allergen Reactions  . Sulfa Antibiotics Hives  . Morphine And Related Other (See Comments)    Altered mental status    Current Outpatient Medications  Medication Sig Dispense Refill  . albuterol (VENTOLIN HFA) 108 (90 Base) MCG/ACT inhaler Inhale 2 puffs into the lungs every 6 (six) hours as needed for wheezing or shortness of breath. 18 g 1  . amLODipine (NORVASC) 10 MG tablet Take 1 tablet (10 mg total) by mouth daily. 90 tablet 3  . aspirin-acetaminophen-caffeine (EXCEDRIN MIGRAINE) 250-250-65 MG tablet Take 2 tablets by mouth every 6 (six) hours as needed for headache.    [name] aspirin-sod bicarb-citric acid (ALKA-SELTZER) 325 MG TBEF tablet Take 650 mg by mouth every 6 (six) hours as needed (heartburn).    . cetirizine (ZYRTEC) 10 MG tablet Take 10 mg by mouth at bedtime.     . diclofenac Sodium  (VOLTAREN) 1 % GEL Apply 1 application topically 4 (four) times daily as needed (knee pain).    [name] loperamide (IMODIUM A-D) 2 MG capsule Take 4 mg by mouth as needed for diarrhea or loose stools.     . Multiple Vitamin (MULTIVITAMIN WITH MINERALS) TABS tablet Take 1 tablet by mouth every evening.     [name] Naphazoline-Pheniramine (VISINE-A OP) Place 1 drop into both eyes daily as needed (allergies).    . Oxymetazoline HCl (AFRIN EXTRA MOISTURIZING NA) Place 1 spray into the nose 2 (two) times daily as needed (congestion.).    [name] tadalafil (ADCIRCA/CIALIS) 20 MG tablet Take 1 tablet (20 mg total) by mouth daily as needed for erectile dysfunction. 6 tablet 10  . timolol (BETIMOL) 0.5 % ophthalmic solution Place 1 drop into the right eye at bedtime.      No current facility-administered medications for this visit.    OBJECTIVE: Vitals:   06/02/19 1045  BP: 137/63  Pulse: 100  Resp: 20  SpO2: 98%     Body mass index is 34.14 kg/m.    ECOG FS:0 - Asymptomatic  General: Well-developed, well-nourished, no acute distress. Eyes: Pink conjunctiva, anicteric sclera. HEENT: Normocephalic, moist mucous membranes. Lungs: No audible wheezing or coughing. Heart:  Regular rate and rhythm. Abdomen: Soft, nontender, no obvious distention.  Colostomy in place. Musculoskeletal: No edema, cyanosis, or clubbing. Neuro: Alert, answering all questions appropriately. Cranial nerves grossly intact. Skin: No rashes or petechiae noted. Psych: Normal affect.   LAB RESULTS:  Lab Results  Component Value Date   NA 138 05/26/2019   K 4.0 05/26/2019   CL 105 05/26/2019   CO2 25 05/26/2019   GLUCOSE 100 (H) 05/26/2019   BUN 18 05/26/2019   CREATININE 0.79 05/26/2019   CALCIUM 8.9 05/26/2019   PROT 7.0 05/26/2019   ALBUMIN 4.1 05/26/2019   AST 18 05/26/2019   ALT 19 05/26/2019   ALKPHOS 65 05/26/2019   BILITOT 0.8 05/26/2019   GFRNONAA >60 05/26/2019   GFRAA >60 05/26/2019    Lab Results  Component  Value Date   WBC 3.4 (L) 05/26/2019   NEUTROABS 2.0 05/26/2019   HGB 12.8 (L) 05/26/2019   HCT 39.1 05/26/2019   MCV 88.1 05/26/2019   PLT 312 05/26/2019     STUDIES: CT CHEST W CONTRAST  Result Date: 05/26/2019 CLINICAL DATA:  Stage IV rectal cancer with left upper lobe lung metastasis status post chemotherapy and left upper lobectomy on 02/03/2019. Restaging. EXAM: CT CHEST WITH CONTRAST TECHNIQUE: Multidetector CT imaging of the chest was performed during intravenous contrast administration. CONTRAST:  109m OMNIPAQUE IOHEXOL 300 MG/ML  SOLN COMPARISON:  06/13/2026 chest CT. 11/04/2018 PET-CT. FINDINGS: Cardiovascular: Normal heart size. No significant pericardial effusion/thickening. Left anterior descending and left circumflex coronary atherosclerosis. Left subclavian Port-A-Cath terminates at the cavoatrial junction. Atherosclerotic nonaneurysmal thoracic aorta. Normal caliber pulmonary arteries. No central pulmonary emboli. Mediastinum/Nodes: No discrete thyroid nodules. Unremarkable esophagus. No pathologically enlarged axillary, mediastinal or hilar lymph nodes. Lungs/Pleura: No pneumothorax. No pleural effusion. Interval left upper lobectomy. No acute consolidative airspace disease or lung masses. Indistinct peripheral left lower lobe 0.6 cm nodular opacity (series 3/image 92), appearing bandlike on coronal reformats, new. A few small subpleural pulmonary nodules in the basilar left lower lobe, largest 0.5 cm (series 3/image 115), all stable. No additional new significant pulmonary nodules. Upper abdomen: Cholecystectomy. Stable subcentimeter hypodense renal cortical lesion in the posterior upper left kidney, too small to characterize, requiring no follow-up. Colonic diverticulosis. Musculoskeletal: No aggressive appearing focal osseous lesions. Mild thoracic spondylosis. IMPRESSION: 1. Interval left upper lobectomy. No findings highly suspicious for local tumor recurrence or metastatic disease.  A small nodular bandlike opacity in the peripheral left lower lobe is new, favor mild postsurgical scarring, recommend attention on follow-up chest CT in 3-6 months. 2. Two-vessel coronary atherosclerosis. 3. Aortic Atherosclerosis (ICD10-I70.0). Electronically Signed   By: JIlona SorrelM.D.   On: 05/26/2019 13:08    ASSESSMENT: Recurrent, stage IV rectal adenocarcinoma.  PLAN:    1.  Recurrent, stage IV rectal adenocarcinoma: Patient was initially considered a clinical stage IIIa, but after completing neoadjuvant 5-FU and XRT followed by surgical excision on October 08, 2013 he was noted to be pathologic stage Ia.  He declined adjuvant FOLFOX at that time.  More recently, patient's CEA started trending up and CT of the chest revealed a large 7.7 cm left upper lobe mass biopsy consistent with rectal adenocarcinoma.  Patient completed cycle 6 of FOLFOX plus Avastin on October 16, 2018.  Repeat biopsy did not reveal any evidence of malignancy, but persistent fungal infection.  Patient underwent repeat bronchoscopy on December 30, 2018 which did in fact reveal residual malignancy.  He subsequently underwent surgical resection on February 03, 2019 with complete  removal of residual disease.  One lymph node had a "fragment" of malignancy.  Patient did not require additional adjuvant chemotherapy or XRT.  His most recent CT scan from May 26, 2019 reviewed independently and reported as above with no obvious evidence of disease.  Will repeat imaging in 3 months with telemedicine visit follow-up 1 to 2 days later.  If patient has continued no evidence of disease, will likely transition to imaging every 6 months.   2.  Leukopenia: Resolved. 3.  Cough: Patient does not complain of this today.  Continue Tessalon Perles as needed. 4.  Insomnia: Patient does not complain of this today.  Continue Ambien as needed.   Patient expressed understanding and was in agreement with this plan. He also understands that He can  call clinic at any time with any questions, concerns, or complaints.    Lloyd Huger, MD   06/02/2019 1:13 PM

## 2019-06-02 ENCOUNTER — Other Ambulatory Visit: Payer: Self-pay

## 2019-06-02 ENCOUNTER — Inpatient Hospital Stay (HOSPITAL_BASED_OUTPATIENT_CLINIC_OR_DEPARTMENT_OTHER): Payer: 59 | Admitting: Oncology

## 2019-06-02 ENCOUNTER — Encounter: Payer: Self-pay | Admitting: Oncology

## 2019-06-02 VITALS — BP 137/63 | HR 100 | Resp 20 | Wt 273.1 lb

## 2019-06-02 DIAGNOSIS — C20 Malignant neoplasm of rectum: Secondary | ICD-10-CM | POA: Diagnosis not present

## 2019-06-02 DIAGNOSIS — Z9221 Personal history of antineoplastic chemotherapy: Secondary | ICD-10-CM | POA: Diagnosis not present

## 2019-06-02 DIAGNOSIS — G4700 Insomnia, unspecified: Secondary | ICD-10-CM | POA: Diagnosis not present

## 2019-06-02 DIAGNOSIS — C7802 Secondary malignant neoplasm of left lung: Secondary | ICD-10-CM | POA: Diagnosis not present

## 2019-06-02 DIAGNOSIS — R05 Cough: Secondary | ICD-10-CM | POA: Diagnosis not present

## 2019-06-02 DIAGNOSIS — Z923 Personal history of irradiation: Secondary | ICD-10-CM | POA: Diagnosis not present

## 2019-06-02 NOTE — Progress Notes (Signed)
Patient here today for follow up. Reports no pain or new concerns today.

## 2019-07-31 ENCOUNTER — Encounter: Payer: Self-pay | Admitting: Cardiothoracic Surgery

## 2019-07-31 ENCOUNTER — Encounter: Payer: Self-pay | Admitting: Oncology

## 2019-08-14 ENCOUNTER — Ambulatory Visit (INDEPENDENT_AMBULATORY_CARE_PROVIDER_SITE_OTHER): Payer: 59 | Admitting: Cardiothoracic Surgery

## 2019-08-14 ENCOUNTER — Other Ambulatory Visit: Payer: Self-pay

## 2019-08-14 VITALS — BP 150/82 | HR 86 | Temp 97.7°F | Resp 20 | Ht 75.0 in | Wt 280.0 lb

## 2019-08-14 DIAGNOSIS — R911 Solitary pulmonary nodule: Secondary | ICD-10-CM

## 2019-08-14 DIAGNOSIS — Z902 Acquired absence of lung [part of]: Secondary | ICD-10-CM | POA: Diagnosis not present

## 2019-08-14 NOTE — Progress Notes (Signed)
[address]       [place] [address]             [phone]                  [name] [name] Medical Record #[id number] Date of Birth: [birth year]  Referring IO:EVOJ, Christia Reading, MD Primary Cardiology: Primary Care:Boyce, Astrid Divine, FNP Oncology: Dr. Lilian Kapur  Chief Complaint:  Follow Up Visit OPERATIVE REPORT DATE OF PROCEDURE:  02/03/2019 PREOPERATIVE DIAGNOSIS:  Left upper lobe lung mass, question isolated pulmonary metastasis from rectal carcinoma, question fungus ball. POSTOPERATIVE DIAGNOSIS:  Left upper lobe lung mass,final path and micro pending. SURGICAL PROCEDURE:  Bronchoscopy, left video-assisted thoracoscopy, mini thoracotomy, left upper lobectomy with lymph node dissection, intercostal nerve block with Exparel.  Cancer Staging Rectal cancer [facility]) Staging form: Colon and Rectum, AJCC 7th Edition - Clinical stage from 08/05/2014: Stage IVA (T2, N0, M1a) - Signed by Lloyd Huger, MD on 07/06/2018   History of Present Illness:     Patient returns to the office today in follow-up after left upper lobe resection for an isolated pulmonary metastasis of rectal mucin producing carcinoma.  Patient continues to do well postoperatively.  He does note some shortness of breath with exertion but is able to carry on usual daily activities without difficulty.  He has had no hemoptysis or significant mucus production on coughing.  Follow-up CT scan was done in May he has another scan CT of the chest ordered for mid August.     Social History   Tobacco Use  Smoking Status Never Smoker  Smokeless Tobacco Current User  . Types: Chew  Tobacco Comment   might smoke a cigar once every 3 months       Allergies  Allergen Reactions  . Sulfa Antibiotics Hives  . Morphine And Related Other (See Comments)    Altered mental status    Current Outpatient Medications  Medication Sig Dispense Refill  . albuterol (VENTOLIN HFA) 108 (90 Base)  MCG/ACT inhaler Inhale 2 puffs into the lungs every 6 (six) hours as needed for wheezing or shortness of breath. 18 g 1  . amLODipine (NORVASC) 10 MG tablet Take 1 tablet (10 mg total) by mouth daily. 90 tablet 3  . aspirin-acetaminophen-caffeine (EXCEDRIN MIGRAINE) 250-250-65 MG tablet Take 2 tablets by mouth every 6 (six) hours as needed for headache.    [name] aspirin-sod bicarb-citric acid (ALKA-SELTZER) 325 MG TBEF tablet Take 650 mg by mouth every 6 (six) hours as needed (heartburn).    . cetirizine (ZYRTEC) 10 MG tablet Take 10 mg by mouth at bedtime.     . diclofenac Sodium (VOLTAREN) 1 % GEL Apply 1 application topically 4 (four) times daily as needed (knee pain).    [name] loperamide (IMODIUM A-D) 2 MG capsule Take 4 mg by mouth as needed for diarrhea or loose stools.     . Multiple Vitamin (MULTIVITAMIN WITH MINERALS) TABS tablet Take 1 tablet by mouth every evening.     [name] Naphazoline-Pheniramine (VISINE-A OP) Place 1 drop into both eyes daily as needed (allergies).    . Oxymetazoline HCl (AFRIN EXTRA MOISTURIZING NA) Place 1 spray into the nose 2 (two) times daily as needed (congestion.).    [name] tadalafil (ADCIRCA/CIALIS) 20 MG tablet Take 1 tablet (20 mg total) by mouth daily as needed for erectile dysfunction. 6 tablet 10  . timolol (BETIMOL) 0.5 % ophthalmic solution Place 1 drop into the right  eye at bedtime.      No current facility-administered medications for this visit.       Physical Exam: BP (!) 150/82 (BP Location: Right Arm, Patient Position: Sitting, Cuff Size: Normal)   Pulse 86   Temp 97.7 F (36.5 C) (Temporal)   Resp 20   Ht 6\' 3"  (1.905 m)   Wt (!) 280 lb (127 kg)   SpO2 94% Comment: RA  BMI 35.00 kg/m   General appearance: alert, cooperative and no distress Head: Normocephalic, without obvious abnormality, atraumatic Neck: no adenopathy, no carotid bruit, no JVD, supple, symmetrical, trachea midline and thyroid not enlarged, symmetric, no  tenderness/mass/nodules Lymph nodes: Cervical, supraclavicular, and axillary nodes normal. Resp: clear to auscultation bilaterally Cardio: regular rate and rhythm, S1, S2 normal, no murmur, click, rub or gallop GI: soft, non-tender; bowel sounds normal; no masses,  no organomegaly Extremities: extremities normal, atraumatic, no cyanosis or edema and Homans sign is negative, no sign of DVT Neurologic: Grossly normal  Incisions are well-healed  Diagnostic Studies & Laboratory data:         Recent Radiology Findings: CT CHEST W CONTRAST  Result Date: 05/26/2019 CLINICAL DATA:  Stage IV rectal cancer with left upper lobe lung metastasis status post chemotherapy and left upper lobectomy on 02/03/2019. Restaging. EXAM: CT CHEST WITH CONTRAST TECHNIQUE: Multidetector CT imaging of the chest was performed during intravenous contrast administration. CONTRAST:  51mL OMNIPAQUE IOHEXOL 300 MG/ML  SOLN COMPARISON:  06/13/2026 chest CT. 11/04/2018 PET-CT. FINDINGS: Cardiovascular: Normal heart size. No significant pericardial effusion/thickening. Left anterior descending and left circumflex coronary atherosclerosis. Left subclavian Port-A-Cath terminates at the cavoatrial junction. Atherosclerotic nonaneurysmal thoracic aorta. Normal caliber pulmonary arteries. No central pulmonary emboli. Mediastinum/Nodes: No discrete thyroid nodules. Unremarkable esophagus. No pathologically enlarged axillary, mediastinal or hilar lymph nodes. Lungs/Pleura: No pneumothorax. No pleural effusion. Interval left upper lobectomy. No acute consolidative airspace disease or lung masses. Indistinct peripheral left lower lobe 0.6 cm nodular opacity (series 3/image 92), appearing bandlike on coronal reformats, new. A few small subpleural pulmonary nodules in the basilar left lower lobe, largest 0.5 cm (series 3/image 115), all stable. No additional new significant pulmonary nodules. Upper abdomen: Cholecystectomy. Stable subcentimeter  hypodense renal cortical lesion in the posterior upper left kidney, too small to characterize, requiring no follow-up. Colonic diverticulosis. Musculoskeletal: No aggressive appearing focal osseous lesions. Mild thoracic spondylosis. IMPRESSION: 1. Interval left upper lobectomy. No findings highly suspicious for local tumor recurrence or metastatic disease. A small nodular bandlike opacity in the peripheral left lower lobe is new, favor mild postsurgical scarring, recommend attention on follow-up chest CT in 3-6 months. 2. Two-vessel coronary atherosclerosis. 3. Aortic Atherosclerosis (ICD10-I70.0). Electronically Signed   By: Ilona Sorrel M.D.   On: 05/26/2019 13:08    I have independently reviewed the above radiology findings and reviewed findings  with the patient.  Recent Labs: Lab Results  Component Value Date   WBC 3.4 (L) 05/26/2019   HGB 12.8 (L) 05/26/2019   HCT 39.1 05/26/2019   PLT 312 05/26/2019   GLUCOSE 100 (H) 05/26/2019   CHOL 147 03/22/2018   TRIG 126 03/22/2018   HDL 35 (L) 03/22/2018   LDLCALC 89 03/22/2018   ALT 19 05/26/2019   AST 18 05/26/2019   NA 138 05/26/2019   K 4.0 05/26/2019   CL 105 05/26/2019   CREATININE 0.79 05/26/2019   BUN 18 05/26/2019   CO2 25 05/26/2019   TSH 3.96 07/03/2016   INR 0.9 01/30/2019   Pathology:  FINAL MICROSCOPIC DIAGNOSIS:   A. LYMPH NODE, 5, BIOPSY:  - One lymph node, negative for carcinoma (0/1).   B. LYMPH NODE, 10L, BIOPSY:  - One lymph node, negative for carcinoma (0/1).   C. LUNG, LEFT UPPER LOBE, LOBECTOMY:  - Adenocarcinoma with extracellular mucin, 10.3 cm.  - Margins of resection are not involved.  - See comment.   D. LYMPH NODE, 11L, BIOPSY:  - One lymph node, negative for carcinoma (0/1).   E. LYMPH NODE, 11L #2, BIOPSY:  - One lymph node, negative for carcinoma (0/1).   F. LYMPH NODE, 11L #3, BIOPSY:  - Fragment of adenocarcinoma.  - Detached fragment of fibroadipose tissue.  - No distinct nodal tissue  identified.   G. LYMPH NODE, 12L, BIOPSY:  - One lymph node, negative for carcinoma (0/1).   H. LYMPH NODE, 4L, BIOPSY:  - One lymph node, negative for carcinoma (0/1).   I. LYMPH NODE, 8, BIOPSY:  - One lymph node, negative for carcinoma (0/1).   COMMENT:   C. By morphology and review of the record, the findings are compatible  with the provided clinical history of colorectal carcinoma.   INTRAOPERATIVE DIAGNOSIS:    C. Left upper lobe bronchial margin: "Negative for neoplasm."  Intraoperative diagnosis rendered by Dr. Jeannie Done at 3:20 PM on  02/03/2019.   GROSS DESCRIPTION:   A: Received fresh is a 0.8 cm anthracotic lymph node. Submitted in toto  in 1 cassette.   B: Received fresh is a 1.2 cm anthracotic lymph node. Submitted in toto  in 1 cassette.   C: Specimen: Left upper lobectomy, received fresh for rapid  intraoperative consult.  Specimen integrity: The pleura is previously incised.  Size, weight: 23.5 x 15.2 x 8.7 cm, 499 g.  Pleura: The pleura is previously incised and there is mucinous material  exuding from the incision sites.  Lesion: The cut surface shows a 10.3 x 8.2 x 6.8 cm well-circumscribed  mass. The cut surface is tan-white and mucinous. The mass extends to  within 1 cm of the bronchial margin.  Margins: The bronchial margin is grossly free of tumor and is submitted  for frozen section.  Hilar vessels: Grossly free of tumor.  Nonneoplastic parenchyma: Spongy, red-brown.  Lymph nodes: None.  Block Summary: 9 blocks submitted.  1 = bronchial margin submitted for frozen section.  2 = vascular margins.  3-8 = tumor.  9 = uninvolved parenchyma.   D: Received fresh is a 1.7 cm anthracotic lymph node. Sectioned and  entirely submitted in 1 cassette.   E: Received fresh is a 0.7 cm anthracotic lymph node. Bisected and  entirely submitted in 1 cassette.   F: Received fresh are two fragments of tan-pink soft tissue, each 0.4  cm. Submitted  in toto in 1 cassette.   G: Received fresh is a 0.7 cm anthracotic lymph node. Submitted in toto  in 1 cassette.   H: Received fresh is a 1.1 cm anthracotic lymph node. Sectioned and  entirely submitted in 1 cassette.   I: Received fresh is a 1 cm anthracotic lymph node. Submitted in toto  in 1 cassette. (GRP 02/04/2019)    Assessment / Plan:    #1 patient is status post left upper lobectomy for mucin producing isolated metastasis of a mucin producing rectal carcinoma-patient is doing well postoperatively-as this was his only site of metastatic disease is currently under observation with follow-up CT scan ordered for August with lab work at that time   plan  see the patient back postop about 6 months-he is closely followed in the cancer center in [place].  Medication Changes: No orders of the defined types were placed in this encounter.     Grace Isaac 08/14/2019 11:12 AM

## 2019-09-02 ENCOUNTER — Other Ambulatory Visit: Payer: 59

## 2019-09-02 ENCOUNTER — Ambulatory Visit: Payer: 59

## 2019-09-05 ENCOUNTER — Telehealth: Payer: 59 | Admitting: Oncology

## 2019-09-10 ENCOUNTER — Ambulatory Visit: Payer: 59

## 2019-09-10 ENCOUNTER — Inpatient Hospital Stay: Payer: 59

## 2019-09-11 NOTE — Progress Notes (Signed)
[place]  Telephone:([phone]) [phone] Fax:(336) [phone]  ID: [name] [name] OB: 05/06/1955  MR#: [id number]  VQM#:[id number]  Patient Care Team: Hubbard Hartshorn, FNP as PCP - General (Family Medicine) Marlyce Huge, MD as Attending Physician (Surgery) Lloyd Huger, MD as Consulting Physician (Oncology) Abbie Sons, MD (Urology) Eulogio Bear, MD as Consulting Physician (Ophthalmology) Flora Lipps, MD as Consulting Physician (Pulmonary Disease)   I connected with [name] [name] on 09/18/19 at  3:15 PM EDT by video enabled telemedicine visit and verified that I am speaking with the correct person using two identifiers.   I discussed the limitations, risks, security and privacy concerns of performing an evaluation and management service by telemedicine and the availability of in-person appointments. I also discussed with the patient that there may be a patient responsible charge related to this service. The patient expressed understanding and agreed to proceed.   Other persons participating in the visit and their role in the encounter: Patient, MD.  Patient's location: Home. Provider's location: Home.  CHIEF COMPLAINT: Recurrent, stage IV rectal adenocarcinoma.  INTERVAL HISTORY: Patient agreed to video assisted telemedicine visit for further evaluation and discussion of his imaging results.  He occasionally gets short of breath which is helped with an inhaler, but otherwise feels well and is asymptomatic.  He has no neurologic complaints.  He denies any weakness or fatigue. He does not complain of peripheral neuropathy today.  He denies any recent fevers or illnesses.  He denies any chest pain, cough, or hemoptysis.  He denies nausea, vomiting, constipation, or diarrhea. He has noted no blood or melena in his colostomy bag.  He has no urinary complaints.  Patient offers no further specific complaints today.  REVIEW OF SYSTEMS:   Review of Systems   Constitutional: Negative for fever, malaise/fatigue and weight loss.  Respiratory: Positive for shortness of breath. Negative for cough.   Cardiovascular: Negative.  Negative for chest pain and leg swelling.  Gastrointestinal: Negative.  Negative for abdominal pain, blood in stool, constipation, diarrhea and melena.  Genitourinary: Negative.  Negative for dysuria.  Musculoskeletal: Negative.  Negative for back pain.  Skin: Negative.  Negative for rash.  Neurological: Negative.  Negative for tingling, sensory change, focal weakness, weakness and headaches.  Psychiatric/Behavioral: Negative.  The patient is not nervous/anxious and does not have insomnia.     As per HPI. Otherwise, a complete review of systems is negative.  PAST MEDICAL HISTORY: Past Medical History:  Diagnosis Date  . Anemia   . [facility])    rectal 2015, followed by Oncology, permanent colostomy  . Colostomy in place [facility])   . Dyspnea    on exertion  . History of kidney stones   . Pneumonia   . Seasonal allergies     PAST SURGICAL HISTORY: Past Surgical History:  Procedure Laterality Date  . BACK SURGERY    . CHOLECYSTECTOMY    . COLON SURGERY    . ENDOBRONCHIAL ULTRASOUND N/A 07/01/2018   Procedure: ENDOBRONCHIAL ULTRASOUND;  Surgeon: Tyler Pita, MD;  Location: [facility];  Service: Cardiopulmonary;  Laterality: N/A;  . FLEXIBLE BRONCHOSCOPY Left 11/20/2018   Procedure: FLEXIBLE BRONCHOSCOPY;  Surgeon: Tyler Pita, MD;  Location: [facility];  Service: Cardiopulmonary;  Laterality: Left;  . INTERCOSTAL NERVE BLOCK Left 02/03/2019   Procedure: INTERCOSTAL NERVE BLOCK WITH EXPAREL;  Surgeon: Grace Isaac, MD;  Location: [place];  Service: Thoracic;  Laterality: Left;  . KNEE SURGERY Bilateral   .  LOBECTOMY Left 02/03/2019   Procedure: LEFT UPPER LOBECTOMY WITH LYMPH NODE DISSECTION;  Surgeon: Grace Isaac, MD;  Location: [place];  Service: Thoracic;  Laterality: Left;  . PORT A CATH REVISION  Left 08/05/2018   Procedure: PORT A CATH REVISION, REPOSITION LEFT;  Surgeon: Robert Bellow, MD;  Location: [facility];  Service: General;  Laterality: Left;  . PORT-A-CATH REMOVAL Right 12/02/2014   Procedure: REMOVAL PORT-A-CATH;  Surgeon: Marlyce Huge, MD;  Location: [facility];  Service: General;  Laterality: Right;  . PORTACATH PLACEMENT    . PORTACATH PLACEMENT Left 07/29/2018   Procedure: INSERTION PORT-A-CATH LEFT;  Surgeon: Robert Bellow, MD;  Location: [facility];  Service: General;  Laterality: Left;  . THORACOTOMY/LOBECTOMY Left 02/03/2019   Procedure: MINITHORACOTOMY;  Surgeon: Grace Isaac, MD;  Location: [place];  Service: Thoracic;  Laterality: Left;  [name] VIDEO ASSISTED THORACOSCOPY (VATS)/WEDGE RESECTION Left 02/03/2019   Procedure: VIDEO ASSISTED THORACOSCOPY (VATS)/LUNG RESECTION;  Surgeon: Grace Isaac, MD;  Location: [place];  Service: Thoracic;  Laterality: Left;  [name] VIDEO BRONCHOSCOPY N/A 02/03/2019   Procedure: VIDEO BRONCHOSCOPY;  Surgeon: Grace Isaac, MD;  Location: [facility];  Service: Thoracic;  Laterality: N/A;  . VIDEO BRONCHOSCOPY WITH ENDOBRONCHIAL ULTRASOUND N/A 12/30/2018   Procedure: VIDEO BRONCHOSCOPY WITH ENDOBRONCHIAL ULTRASOUND;  Surgeon: Grace Isaac, MD;  Location: [place];  Service: Thoracic;  Laterality: N/A;    FAMILY HISTORY: Father with prostate cancer.  COPD, CHF.     ADVANCED DIRECTIVES:    HEALTH MAINTENANCE: Social History   Tobacco Use  . Smoking status: Never Smoker  . Smokeless tobacco: Current User    Types: Chew  . Tobacco comment: might smoke a cigar once every 3 months  Vaping Use  . Vaping Use: Never used  Substance Use Topics  . Alcohol use: Yes    Alcohol/week: 0.0 standard drinks    Comment: RARE  . Drug use: No     Colonoscopy:  PAP:  Bone density:  Lipid panel:  Allergies  Allergen Reactions  . Sulfa Antibiotics Hives  . Morphine And Related Other (See Comments)    Altered mental status     Current Outpatient Medications  Medication Sig Dispense Refill  . albuterol (VENTOLIN HFA) 108 (90 Base) MCG/ACT inhaler INHALE 2 PUFFS INTO THE LUNGS EVERY 6 HOURS AS NEEDED FOR WHEEZING OR SHORTNESS OF BREATH. 18 g 1  . amLODipine (NORVASC) 10 MG tablet Take 1 tablet (10 mg total) by mouth daily. 90 tablet 3  . aspirin-acetaminophen-caffeine (EXCEDRIN MIGRAINE) 250-250-65 MG tablet Take 2 tablets by mouth every 6 (six) hours as needed for headache.    [name] aspirin-sod bicarb-citric acid (ALKA-SELTZER) 325 MG TBEF tablet Take 650 mg by mouth every 6 (six) hours as needed (heartburn).    . cetirizine (ZYRTEC) 10 MG tablet Take 10 mg by mouth at bedtime.     . diclofenac Sodium (VOLTAREN) 1 % GEL Apply 1 application topically 4 (four) times daily as needed (knee pain).    [name] loperamide (IMODIUM A-D) 2 MG capsule Take 4 mg by mouth as needed for diarrhea or loose stools.     . Multiple Vitamin (MULTIVITAMIN WITH MINERALS) TABS tablet Take 1 tablet by mouth every evening.     [name] Naphazoline-Pheniramine (VISINE-A OP) Place 1 drop into both eyes daily as needed (allergies).    . Oxymetazoline HCl (AFRIN EXTRA MOISTURIZING NA) Place 1 spray into the nose 2 (two) times daily as needed (congestion.).    [name]  tadalafil (ADCIRCA/CIALIS) 20 MG tablet Take 1 tablet (20 mg total) by mouth daily as needed for erectile dysfunction. 6 tablet 10  . timolol (BETIMOL) 0.5 % ophthalmic solution Place 1 drop into the right eye at bedtime.      No current facility-administered medications for this visit.    OBJECTIVE: There were no vitals filed for this visit.   There is no height or weight on file to calculate BMI.    ECOG FS:0 - Asymptomatic  General: Well-developed, well-nourished, no acute distress. HEENT: Normocephalic. Neuro: Alert, answering all questions appropriately. Cranial nerves grossly intact. Psych: Normal affect.  LAB RESULTS:  Lab Results  Component Value Date   NA 139 09/16/2019   K 4.1  09/16/2019   CL 103 09/16/2019   CO2 27 09/16/2019   GLUCOSE 103 (H) 09/16/2019   BUN 26 (H) 09/16/2019   CREATININE 0.73 09/16/2019   CALCIUM 9.2 09/16/2019   PROT 7.2 09/16/2019   ALBUMIN 4.1 09/16/2019   AST 26 09/16/2019   ALT 29 09/16/2019   ALKPHOS 81 09/16/2019   BILITOT 0.5 09/16/2019   GFRNONAA >60 09/16/2019   GFRAA >60 09/16/2019    Lab Results  Component Value Date   WBC 4.5 09/16/2019   NEUTROABS 2.6 09/16/2019   HGB 12.6 (L) 09/16/2019   HCT 37.4 (L) 09/16/2019   MCV 86.0 09/16/2019   PLT 332 09/16/2019     STUDIES: CT CHEST W CONTRAST  Result Date: 09/16/2019 CLINICAL DATA:  Restaging metastatic rectal cancer EXAM: CT CHEST, ABDOMEN, AND PELVIS WITH CONTRAST TECHNIQUE: Multidetector CT imaging of the chest, abdomen and pelvis was performed following the standard protocol during bolus administration of intravenous contrast. CONTRAST:  154m OMNIPAQUE IOHEXOL 300 MG/ML SOLN, additional oral enteric contrast COMPARISON:  CT chest, 05/26/2019, PET-CT, 11/04/2018 FINDINGS: CT CHEST FINDINGS Cardiovascular: Left chest port catheter. Aortic atherosclerosis. Normal heart size. Three-vessel coronary artery calcifications. No pericardial effusion. Mediastinum/Nodes: No enlarged mediastinal, hilar, or axillary lymph nodes. Thyroid gland, trachea, and esophagus demonstrate no significant findings. Lungs/Pleura: Status post left upper lobectomy. No pleural effusion or pneumothorax. Musculoskeletal: No chest wall mass or suspicious bone lesions identified. CT ABDOMEN PELVIS FINDINGS Hepatobiliary: No focal liver abnormality is seen. Status post cholecystectomy. No biliary dilatation. Pancreas: Unremarkable. No pancreatic ductal dilatation or surrounding inflammatory changes. Spleen: Normal in size without significant abnormality. Adrenals/Urinary Tract: Adrenal glands are unremarkable. Tiny nonobstructive inferior pole calculus of the left kidney. Bladder is unremarkable.  Stomach/Bowel: Stomach is within normal limits. Redemonstrated postoperative findings of abdominoperineal resection and left lower quadrant end colostomy. Diverticula of the remnant descending and sigmoid colon without evidence of acute diverticulitis. No change in post operative and post treatment appearance of soft tissue in the low pelvis, not previously PET avid (series 2, image 122). Vascular/Lymphatic: Aortic atherosclerosis. No enlarged abdominal or pelvic lymph nodes. Reproductive: No mass or other abnormality. Other: No abdominal wall hernia or abnormality. No abdominopelvic ascites. Musculoskeletal: No acute or significant osseous findings. IMPRESSION: 1. Redemonstrated postoperative findings of abdominoperineal resection and left lower quadrant end colostomy. No change in post operative and post treatment appearance of soft tissue in the low pelvis, not previously PET avid. No evidence of recurrent or metastatic disease in the abdomen or pelvis. 2. Status post left upper lobectomy. No evidence of recurrent or metastatic disease in the chest. 3. Nonobstructive left nephrolithiasis. 4. Coronary artery disease. Aortic Atherosclerosis (ICD10-I70.0). Electronically Signed   By: AEddie CandleM.D.   On: 09/16/2019 14:34   CT Abdomen  Pelvis W Contrast  Result Date: 09/16/2019 CLINICAL DATA:  Restaging metastatic rectal cancer EXAM: CT CHEST, ABDOMEN, AND PELVIS WITH CONTRAST TECHNIQUE: Multidetector CT imaging of the chest, abdomen and pelvis was performed following the standard protocol during bolus administration of intravenous contrast. CONTRAST:  150m OMNIPAQUE IOHEXOL 300 MG/ML SOLN, additional oral enteric contrast COMPARISON:  CT chest, 05/26/2019, PET-CT, 11/04/2018 FINDINGS: CT CHEST FINDINGS Cardiovascular: Left chest port catheter. Aortic atherosclerosis. Normal heart size. Three-vessel coronary artery calcifications. No pericardial effusion. Mediastinum/Nodes: No enlarged mediastinal, hilar, or  axillary lymph nodes. Thyroid gland, trachea, and esophagus demonstrate no significant findings. Lungs/Pleura: Status post left upper lobectomy. No pleural effusion or pneumothorax. Musculoskeletal: No chest wall mass or suspicious bone lesions identified. CT ABDOMEN PELVIS FINDINGS Hepatobiliary: No focal liver abnormality is seen. Status post cholecystectomy. No biliary dilatation. Pancreas: Unremarkable. No pancreatic ductal dilatation or surrounding inflammatory changes. Spleen: Normal in size without significant abnormality. Adrenals/Urinary Tract: Adrenal glands are unremarkable. Tiny nonobstructive inferior pole calculus of the left kidney. Bladder is unremarkable. Stomach/Bowel: Stomach is within normal limits. Redemonstrated postoperative findings of abdominoperineal resection and left lower quadrant end colostomy. Diverticula of the remnant descending and sigmoid colon without evidence of acute diverticulitis. No change in post operative and post treatment appearance of soft tissue in the low pelvis, not previously PET avid (series 2, image 122). Vascular/Lymphatic: Aortic atherosclerosis. No enlarged abdominal or pelvic lymph nodes. Reproductive: No mass or other abnormality. Other: No abdominal wall hernia or abnormality. No abdominopelvic ascites. Musculoskeletal: No acute or significant osseous findings. IMPRESSION: 1. Redemonstrated postoperative findings of abdominoperineal resection and left lower quadrant end colostomy. No change in post operative and post treatment appearance of soft tissue in the low pelvis, not previously PET avid. No evidence of recurrent or metastatic disease in the abdomen or pelvis. 2. Status post left upper lobectomy. No evidence of recurrent or metastatic disease in the chest. 3. Nonobstructive left nephrolithiasis. 4. Coronary artery disease. Aortic Atherosclerosis (ICD10-I70.0). Electronically Signed   By: AEddie CandleM.D.   On: 09/16/2019 14:34    ASSESSMENT:  Recurrent, stage IV rectal adenocarcinoma.  PLAN:    1.  Recurrent, stage IV rectal adenocarcinoma: Patient was initially considered a clinical stage IIIa, but after completing neoadjuvant 5-FU and XRT followed by surgical excision on October 08, 2013 he was noted to be pathologic stage Ia.  He declined adjuvant FOLFOX at that time.  More recently, patient's CEA started trending up and CT of the chest revealed a large 7.7 cm left upper lobe mass biopsy consistent with rectal adenocarcinoma.  Patient completed cycle 6 of FOLFOX plus Avastin on October 16, 2018.  Repeat biopsy did not reveal any evidence of malignancy, but persistent fungal infection.  Patient underwent repeat bronchoscopy on December 30, 2018 which did in fact reveal residual malignancy.  He subsequently underwent surgical resection on February 03, 2019 with complete removal of residual disease.  One lymph node had a "fragment" of malignancy.  Patient did not require additional adjuvant chemotherapy or XRT.  His most recent imaging on September 16, 2019 reviewed independently and reported as above with no obvious evidence of recurrent or progressive disease.  His CEA continues to be within normal limits as well.  No intervention is needed at this time.  Continue routine imaging every 6 months for a minimum of 2 years after his surgery.  Return to clinic in 6 months with repeat imaging and video assisted telemedicine visit.   2.  Leukopenia: Resolved. 3.  Cough:  Patient does not complain of this today.  Continue Tessalon Perles as needed. 4.  Insomnia: Patient does not complain of this today.  Continue Ambien as needed. 5.  Shortness of breath: Continue albuterol inhaler as needed.  I provided 20 minutes of face-to-face video visit time during this encounter which included chart review, counseling, and coordination of care as documented above.   Patient expressed understanding and was in agreement with this plan. He also understands that  He can call clinic at any time with any questions, concerns, or complaints.    Lloyd Huger, MD   09/18/2019 3:53 PM

## 2019-09-12 ENCOUNTER — Inpatient Hospital Stay: Payer: 59 | Admitting: Oncology

## 2019-09-15 ENCOUNTER — Other Ambulatory Visit: Payer: Self-pay | Admitting: Oncology

## 2019-09-16 ENCOUNTER — Other Ambulatory Visit: Payer: Self-pay

## 2019-09-16 ENCOUNTER — Ambulatory Visit
Admission: RE | Admit: 2019-09-16 | Discharge: 2019-09-16 | Disposition: A | Discharge status: Home / Self Care | Payer: 59 | Source: Ambulatory Visit | Attending: Oncology | Admitting: Oncology

## 2019-09-16 ENCOUNTER — Inpatient Hospital Stay: Payer: 59 | Attending: Oncology

## 2019-09-16 DIAGNOSIS — C20 Malignant neoplasm of rectum: Secondary | ICD-10-CM | POA: Insufficient documentation

## 2019-09-16 LAB — COMPREHENSIVE METABOLIC PANEL
ALT: 29 U/L (ref 0–44)
AST: 26 U/L (ref 15–41)
Albumin: 4.1 g/dL (ref 3.5–5.0)
Alkaline Phosphatase: 81 U/L (ref 38–126)
Anion gap: 9 (ref 5–15)
BUN: 26 mg/dL — ABNORMAL HIGH (ref 8–23)
CO2: 27 mmol/L (ref 22–32)
Calcium: 9.2 mg/dL (ref 8.9–10.3)
Chloride: 103 mmol/L (ref 98–111)
Creatinine, Ser: 0.73 mg/dL (ref 0.61–1.24)
GFR calc Af Amer: >60 mL/min (ref >60)
GFR calc non Af Amer: >60 mL/min (ref >60)
Glucose, Bld: 103 mg/dL — ABNORMAL HIGH (ref 70–99)
Potassium: 4.1 mmol/L (ref 3.5–5.1)
Sodium: 139 mmol/L (ref 135–145)
Total Bilirubin: 0.5 mg/dL (ref 0.3–1.2)
Total Protein: 7.2 g/dL (ref 6.5–8.1)

## 2019-09-16 LAB — CBC WITH DIFFERENTIAL/PLATELET
Abs Immature Granulocytes: 0.04 10*3/uL (ref 0.00–0.07)
Basophils Absolute: 0.1 10*3/uL (ref 0.0–0.1)
Basophils Relative: 1 %
Eosinophils Absolute: 0.2 10*3/uL (ref 0.0–0.5)
Eosinophils Relative: 5 %
HCT: 37.4 % — ABNORMAL LOW (ref 39.0–52.0)
Hemoglobin: 12.6 g/dL — ABNORMAL LOW (ref 13.0–17.0)
Immature Granulocytes: 1 %
Lymphocytes Relative: 22 %
Lymphs Abs: 1 10*3/uL (ref 0.7–4.0)
MCH: 29 pg (ref 26.0–34.0)
MCHC: 33.7 g/dL (ref 30.0–36.0)
MCV: 86 fL (ref 80.0–100.0)
Monocytes Absolute: 0.6 10*3/uL (ref 0.1–1.0)
Monocytes Relative: 13 %
Neutro Abs: 2.6 10*3/uL (ref 1.7–7.7)
Neutrophils Relative %: 58 %
Platelets: 332 10*3/uL (ref 150–400)
RBC: 4.35 MIL/uL (ref 4.22–5.81)
RDW: 14.4 % (ref 11.5–15.5)
WBC: 4.5 10*3/uL (ref 4.0–10.5)
nRBC: 0 % (ref 0.0–0.2)

## 2019-09-16 MED ORDER — IOHEXOL 300 MG/ML  SOLN
100.0000 mL | Freq: Once | INTRAMUSCULAR | Status: AC | PRN
  Administered 2019-09-16: 100 mL via INTRAVENOUS

## 2019-09-16 MED ORDER — HEPARIN SOD (PORK) LOCK FLUSH 100 UNIT/ML IV SOLN
INTRAVENOUS | Status: AC
  Filled 2019-09-16: qty 5

## 2019-09-16 MED ORDER — SODIUM CHLORIDE 0.9% FLUSH
10.0000 mL | Freq: Once | INTRAVENOUS | Status: AC
  Administered 2019-09-16: 10 mL via INTRAVENOUS
  Filled 2019-09-16: qty 10

## 2019-09-16 MED ORDER — HEPARIN SOD (PORK) LOCK FLUSH 100 UNIT/ML IV SOLN
500.0000 [IU] | Freq: Once | INTRAVENOUS | Status: AC
  Administered 2019-09-16: 500 [IU] via INTRAVENOUS
  Filled 2019-09-16: qty 5

## 2019-09-17 LAB — CEA: CEA: 2.1 ng/mL (ref 0.0–4.7)

## 2019-09-18 ENCOUNTER — Inpatient Hospital Stay (HOSPITAL_BASED_OUTPATIENT_CLINIC_OR_DEPARTMENT_OTHER): Payer: 59 | Admitting: Oncology

## 2019-09-18 DIAGNOSIS — C20 Malignant neoplasm of rectum: Secondary | ICD-10-CM

## 2019-10-14 NOTE — Progress Notes (Signed)
Name: [name] [name]   MRN: [id number]    DOB: 10/25/1955   Date:10/15/2019       Progress Note  Subjective  Chief Complaint  Chief Complaint  Patient presents with  . Annual Exam    HPI  Patient is a 64 year old male Last visit to [facility] was in February 2020 with Raelyn Ensign Presents today for an annual physical. All in all notes is doing ok, notes still some dyspnea on exertion, not increased at all   Rectal Cancer History: Patient is still followed by Dr. Grayland Ormond, last visit 09/18/2019 with the following assessment/plan:  ASSESSMENT: Recurrent, stage IV rectal adenocarcinoma.  PLAN:   1.  Recurrent, stage IV rectal adenocarcinoma: Patient was initially considered a clinical stage IIIa, but after completing neoadjuvant 5-FU and XRT followed by surgical excision on October 08, 2013 he was noted to be pathologic stage Ia.  He declined adjuvant FOLFOX at that time.  More recently, patient's CEA started trending up and CT of the chest revealed a large 7.7 cm left upper lobe mass biopsy consistent with rectal adenocarcinoma.  Patient completed cycle 6 of FOLFOX plus Avastin on October 16, 2018.  Repeat biopsy did not reveal any evidence of malignancy, but persistent fungal infection.  Patient underwent repeat bronchoscopy on December 30, 2018 which did in fact reveal residual malignancy.  He subsequently underwent surgical resection on February 03, 2019 with complete removal of residual disease.  One lymph node had a "fragment" of malignancy.  Patient did not require additional adjuvant chemotherapy or XRT.  His most recent imaging on September 16, 2019 reviewed independently and reported as above with no obvious evidence of recurrent or progressive disease.  His CEA continues to be within normal limits as well.  No intervention is needed at this time.  Continue routine imaging every 6 months for a minimum of 2 years after his surgery.  Return to clinic in 6 months with repeat imaging and video  assisted telemedicine visit.   2.  Leukopenia: Resolved. 3.  Cough: Patient does not complain of this today.  Continue Tessalon Perles as needed. 4.  Insomnia: Patient does not complain of this today.  Continue Ambien as needed. 5.  Shortness of breath: Continue albuterol inhaler as needed.  He followed up with cardiothoracic surgery 08/14/2019 with the following assessment/plan:  Assessment / Plan:   #1 patient is status post left upper lobectomy for mucin producing isolated metastasis of a mucin producing rectal carcinoma-patient is doing well postoperatively-as this was his only site of metastatic disease is currently under observation with follow-up CT scan ordered for August with lab work at that time  plan see the patient back postop about 6 months-he is closely followed in the cancer center in [place].  He last saw urology in May 2020 for symptomatic hypogonadism and Peyronie's disease -he has not seen them on follow-up since   Allergic Rhinitis: No recent increase symptoms of concern   History of RIGHT eye vision deficit: Seeing Dr. Edison Pace with [facility] at [facility]; he uses timolol, and was diagnosed with glaucoma, though they are not sure that this is the exact cause of the vision changers.  Does have vision decreased and some color loss (yellow, some greens). Has improved tremendously since started seeing them  Lumbar Back pain and history of back surgery: is no longer followed regularly by spine specialist (Was seeing Dr. Sabra Heck).  Does have some numbness and tingling in the LEFT foot that has been ongoing  since he had ruptured disc/surgery (many years). Noted has some back spasms intermittently and had flexeril in the past to help, and requested refill today and felt reasonable to refill.  Tobacco Use: He does use chewing tobacco daily.    Also rare cigar on the golf course  Obesity:  Wt Readings from Last 3 Encounters:  10/15/19 281 lb (127.5 kg)  08/14/19 (!)  280 lb (127 kg)  06/02/19 273 lb 1.6 oz (123.9 kg)  Weight has been relatively stable in the recent past  USPSTF grade A and B recommendations:  Diet: notes tries to watch and eat healthy, not adherent to a very strict specific diet noted Exercise:   notes no regular exercise, thru coaching baseball, yard work  Depression: phq 9 is negative Depression screen [facility] 2/9 10/15/2019 01/15/2019 12/25/2018 03/22/2018 09/21/2017  Decreased Interest 0 0 0 0 0  Down, Depressed, Hopeless 0 0 0 0 0  PHQ - 2 Score 0 0 0 0 0  Altered sleeping - - - 0 0  Tired, decreased energy - - - 0 0  Change in appetite - - - 0 0  Feeling bad or failure about yourself  - - - 0 0  Trouble concentrating - - - 0 0  Moving slowly or fidgety/restless - - - 0 0  Suicidal thoughts - - - 0 0  PHQ-9 Score - - - 0 0  Difficult doing work/chores - - - Not difficult at all Not difficult at all  Some recent data might be hidden    Hypertension:  BP Readings from Last 3 Encounters:  10/15/19 124/66  08/14/19 (!) 150/82  06/02/19 137/63    Obesity: Wt Readings from Last 3 Encounters:  10/15/19 281 lb (127.5 kg)  08/14/19 (!) 280 lb (127 kg)  06/02/19 273 lb 1.6 oz (123.9 kg)   BMI Readings from Last 3 Encounters:  10/15/19 35.12 kg/m  08/14/19 35.00 kg/m  06/02/19 34.14 kg/m     Lipids:  Lab Results  Component Value Date   CHOL 147 03/22/2018   CHOL 185 02/20/2017   CHOL 188 10/11/2016   Lab Results  Component Value Date   HDL 35 (L) 03/22/2018   HDL 39 (L) 02/20/2017   HDL 40 (L) 10/11/2016   Lab Results  Component Value Date   LDLCALC 89 03/22/2018   LDLCALC 117 (H) 02/20/2017   LDLCALC 118 (H) 10/11/2016   Lab Results  Component Value Date   TRIG 126 03/22/2018   TRIG 176 (H) 02/20/2017   TRIG 189 (H) 10/11/2016   Lab Results  Component Value Date   CHOLHDL 4.2 03/22/2018   CHOLHDL 4.7 02/20/2017   CHOLHDL 4.7 10/11/2016   No results found for: LDLDIRECT  Glucose:  Glucose  Date  Value Ref Range Status  12/25/2013 116 (H) 65 - 99 mg/dL Final  11/27/2013 128 (H) 65 - 99 mg/dL Final  10/30/2013 102 (H) 65 - 99 mg/dL Final   Glucose, Bld  Date Value Ref Range Status  09/16/2019 103 (H) 70 - 99 mg/dL Final    Comment:    Glucose reference range applies only to samples taken after fasting for at least 8 hours.  05/26/2019 100 (H) 70 - 99 mg/dL Final    Comment:    Glucose reference range applies only to samples taken after fasting for at least 8 hours.  02/14/2019 102 (H) 70 - 99 mg/dL Final   Glucose-Capillary  Date Value Ref Range Status  11/04/2018 111 (  H) 70 - 99 mg/dL Final  06/24/2018 94 70 - 99 mg/dL Final      Office Visit from 10/15/2019 in [facility]  AUDIT-C Score 1     Tob - Rare cigar on golf course, uses dip Alcohol - 2-3 beers a month  Married STD testing and prevention (HIV/chl/gon/syphilis): No concerns. Declines Hep C: Negative in 2018  Skin cancer: No concerning lesions Colorectal cancer:  Followed by oncology for colorectal cancer Prostate cancer: Following with urology Lab Results  Component Value Date   PSA1 2.5 12/13/2017   PSA 1.7 07/03/2016  Last PSA 2.56 - 4 months ago   Gets up 1-2 times a night to urinate, no hesitancy, urgency, hematuria, does plan to follow-up again with urology  Lung cancer:  Low Dose CT Chest recommended if Age 22-80 years, 30 pack-year currently smoking OR have quit w/in 15years. Patient does not qualify.   AAA: Does not qualify. The USPSTF recommends one-time screening with ultrasonography in men ages 18 to 27 years who have ever smoked ECG: Denies chest pain, palpitations, does have some shortness of breath with exertion - not new, no increased LE swelling  Advanced Care Planning: A voluntary discussion about advance care planning including the explanation and discussion of advance directives.  Discussed health care proxy and Living will, and the patient was able to identify  a health care proxy as wife, Nicole Hafley.  Patient does not have a living will at present time. If patient does have living will, I have requested they bring this to the clinic to be scanned in to their chart.   Patient Active Problem List   Diagnosis Date Noted  . Mass of left lung 02/03/2019  . Lung mass 01/28/2019  . Medication monitoring encounter 12/25/2018  . Fungal infection of lung 12/10/2018  . Goals of care, counseling/discussion 07/27/2018  . Glaucoma of right eye 03/22/2018  . Erectile dysfunction 03/22/2018  . Class 2 severe obesity due to excess calories with serious comorbidity and body mass index (BMI) of 35.0 to 35.9 in adult ([place]) 03/22/2018  . Decreased vision of right eye 09/21/2017  . Colostomy in place [facility]) 09/21/2017  . Hypogonadism in male 09/21/2017  . Seasonal allergies   . Degeneration of lumbar intervertebral disc 06/26/2017  . Rectal cancer ([place]) 08/05/2014    Past Surgical History:  Procedure Laterality Date  . BACK SURGERY    . CHOLECYSTECTOMY    . COLON SURGERY    . ENDOBRONCHIAL ULTRASOUND N/A 07/01/2018   Procedure: ENDOBRONCHIAL ULTRASOUND;  Surgeon: Tyler Pita, MD;  Location: [facility] ORS;  Service: Cardiopulmonary;  Laterality: N/A;  . FLEXIBLE BRONCHOSCOPY Left 11/20/2018   Procedure: FLEXIBLE BRONCHOSCOPY;  Surgeon: Tyler Pita, MD;  Location: [facility] ORS;  Service: Cardiopulmonary;  Laterality: Left;  . INTERCOSTAL NERVE BLOCK Left 02/03/2019   Procedure: INTERCOSTAL NERVE BLOCK WITH EXPAREL;  Surgeon: Grace Isaac, MD;  Location: [place];  Service: Thoracic;  Laterality: Left;  . KNEE SURGERY Bilateral   . LOBECTOMY Left 02/03/2019   Procedure: LEFT UPPER LOBECTOMY WITH LYMPH NODE DISSECTION;  Surgeon: Grace Isaac, MD;  Location: [place];  Service: Thoracic;  Laterality: Left;  . PORT A CATH REVISION Left 08/05/2018   Procedure: PORT A CATH REVISION, REPOSITION LEFT;  Surgeon: Robert Bellow, MD;  Location: [facility] ORS;   Service: General;  Laterality: Left;  . PORT-A-CATH REMOVAL Right 12/02/2014   Procedure: REMOVAL PORT-A-CATH;  Surgeon: Marlyce Huge, MD;  Location: [facility]  ORS;  Service: General;  Laterality: Right;  . PORTACATH PLACEMENT    . PORTACATH PLACEMENT Left 07/29/2018   Procedure: INSERTION PORT-A-CATH LEFT;  Surgeon: Robert Bellow, MD;  Location: [facility] ORS;  Service: General;  Laterality: Left;  . THORACOTOMY/LOBECTOMY Left 02/03/2019   Procedure: MINITHORACOTOMY;  Surgeon: Grace Isaac, MD;  Location: [place];  Service: Thoracic;  Laterality: Left;  [name] VIDEO ASSISTED THORACOSCOPY (VATS)/WEDGE RESECTION Left 02/03/2019   Procedure: VIDEO ASSISTED THORACOSCOPY (VATS)/LUNG RESECTION;  Surgeon: Grace Isaac, MD;  Location: [place];  Service: Thoracic;  Laterality: Left;  [name] VIDEO BRONCHOSCOPY N/A 02/03/2019   Procedure: VIDEO BRONCHOSCOPY;  Surgeon: Grace Isaac, MD;  Location: [facility] OR;  Service: Thoracic;  Laterality: N/A;  . VIDEO BRONCHOSCOPY WITH ENDOBRONCHIAL ULTRASOUND N/A 12/30/2018   Procedure: VIDEO BRONCHOSCOPY WITH ENDOBRONCHIAL ULTRASOUND;  Surgeon: Grace Isaac, MD;  [facility] OR;  Service: Thoracic;  Laterality: N/A;    Family History  Problem Relation Age of Onset  . Cancer Mother   . Hypertension Mother   . COPD Mother   . COPD Father   . Cancer Father        Prostate CA, on Lupron  . Stroke Maternal Grandmother   . Cancer Paternal Grandmother   . Heart attack Paternal Grandfather   . Cancer Paternal Grandfather     Social History   Socioeconomic History  . Marital status: Married    Spouse name: Olegario Shearer  . Number of children: Not on file  . Years of education: Not on file  . Highest education level: Not on file  Occupational History  . Not on file  Tobacco Use  . Smoking status: Never Smoker  . Smokeless tobacco: Current User    Types: Chew  . Tobacco comment: might smoke a cigar once every 3 months  Vaping Use  . Vaping Use: Never used   Substance and Sexual Activity  . Alcohol use: Yes    Alcohol/week: 0.0 standard drinks    Comment: RARE  . Drug use: No  . Sexual activity: Yes    Partners: Female  Other Topics Concern  . Not on file  Social History Narrative  . Not on file   Social Determinants of Health   Financial Resource Strain: Low Risk   . Difficulty of Paying Living Expenses: Not hard at all  Food Insecurity: No Food Insecurity  . Worried About Charity fundraiser in the Last Year: Never true  . Ran Out of Food in the Last Year: Never true  Transportation Needs: No Transportation Needs  . Lack of Transportation (Medical): No  . Lack of Transportation (Non-Medical): No  Physical Activity: Insufficiently Active  . Days of Exercise per Week: 2 days  . Minutes of Exercise per Session: 10 min  Stress: No Stress Concern Present  . Feeling of Stress : Not at all  Social Connections: Socially Integrated  . Frequency of Communication with Friends and Family: More than three times a week  . Frequency of Social Gatherings with Friends and Family: More than three times a week  . Attends Religious Services: More than 4 times per year  . Active Member of Clubs or Organizations: Yes  . Attends Archivist Meetings: More than 4 times per year  . Marital Status: Married  Human resources officer Violence: Not At Risk  . Fear of Current or Ex-Partner: No  . Emotionally Abused: No  . Physically Abused: No  . Sexually Abused: No  Current Outpatient Medications:  .  albuterol (VENTOLIN HFA) 108 (90 Base) MCG/ACT inhaler, INHALE 2 PUFFS INTO THE LUNGS EVERY 6 HOURS AS NEEDED FOR WHEEZING OR SHORTNESS OF BREATH., Disp: 18 g, Rfl: 1 .  amLODipine (NORVASC) 10 MG tablet, Take 1 tablet (10 mg total) by mouth daily., Disp: 90 tablet, Rfl: 3 .  aspirin-acetaminophen-caffeine (EXCEDRIN MIGRAINE) 250-250-65 MG tablet, Take 2 tablets by mouth every 6 (six) hours as needed for headache., Disp: , Rfl:  .  aspirin-sod  bicarb-citric acid (ALKA-SELTZER) 325 MG TBEF tablet, Take 650 mg by mouth every 6 (six) hours as needed (heartburn)., Disp: , Rfl:  .  cetirizine (ZYRTEC) 10 MG tablet, Take 10 mg by mouth at bedtime. , Disp: , Rfl:  .  diclofenac Sodium (VOLTAREN) 1 % GEL, Apply 1 application topically 4 (four) times daily as needed (knee pain)., Disp: , Rfl:  .  loperamide (IMODIUM A-D) 2 MG capsule, Take 4 mg by mouth as needed for diarrhea or loose stools. , Disp: , Rfl:  .  Multiple Vitamin (MULTIVITAMIN WITH MINERALS) TABS tablet, Take 1 tablet by mouth every evening. , Disp: , Rfl:  .  Naphazoline-Pheniramine (VISINE-A OP), Place 1 drop into both eyes daily as needed (allergies)., Disp: , Rfl:  .  Oxymetazoline HCl (AFRIN EXTRA MOISTURIZING NA), Place 1 spray into the nose 2 (two) times daily as needed (congestion.)., Disp: , Rfl:  .  tadalafil (ADCIRCA/CIALIS) 20 MG tablet, Take 1 tablet (20 mg total) by mouth daily as needed for erectile dysfunction., Disp: 6 tablet, Rfl: 10 .  timolol (BETIMOL) 0.5 % ophthalmic solution, Place 1 drop into the right eye at bedtime. , Disp: , Rfl:   Allergies  Allergen Reactions  . Sulfa Antibiotics Hives  . Morphine And Related Other (See Comments)    Altered mental status     ROS: As noted above in HPI denies any recent unintentional weight loss,  No recent fevers or other Covid concerning sx's No increase headaches,  No CP, palpitations,  No persistent abdominal pains ,  No flank pains or dysuria,  No frequency or urgency No lower extremity swelling,  No increase joint aches or muscle aches, occasional low back spasms noted + numbness, tingling in the lower extremities, bottom of feet from chemo Denies other specific complaints on systems review except as noted in HPI    Objective  Vitals:   10/15/19 1031  BP: 124/66  Pulse: (!) 108  Resp: 16  Temp: 98.3 F (36.8 C)  TempSrc: Oral  SpO2: 99%  Weight: 281 lb (127.5 kg)  Height: _0  (1.905 m)     Body mass index is 35.12 kg/m.  NAD, masked, pleasant HEENT - sclera anicteric, PERRL, EOMI, conj - non-inj'ed, TM's and canals clear,  pharynx clear Neck - supple, no adenopathy, no TM, no JVD, carotids 2+ and = without bruits bilat Car - RRR without m/g/r, not tachycardic on my exam with pulse approximately 90 and regular  Pulm- CTA without wheeze or rales Abd - soft, NT, positive ostomy with bag from left lower quadrant Back - no CVA tenderness, no spinal tenderness, positive scar over the lower lumbar spine, nontender palpating the muscle beds adjacent where he sometimes feels his spasm and discomfort, more often right-sided recent past Skin-no rash on exposed areas, Ext - no LE edema,  GU -deferred with no concerning symptoms recent past Rectal: not done, prostate exam deferred, has been followed with urology and plans to follow-up with them, has  been monitoring PSAs to help follow Neuro - affect was not flat, appropriate with conversation, speech normal  Grossly non-focal with good strength on testing, sensation intact to LT in distal extremities, DTR's 2+ and = patella,  no pronator drift, good balance on one foot, gait normal in the office,   Recent Results (from the past 2160 hour(s))  CBC with Differential     Status: Abnormal   Collection Time: 09/16/19 10:55 AM  Result Value Ref Range   WBC 4.5 4.0 - 10.5 K/uL   RBC 4.35 4.22 - 5.81 MIL/uL   Hemoglobin 12.6 (L) 13.0 - 17.0 g/dL   HCT 37.4 (L) 39 - 52 %   MCV 86.0 80.0 - 100.0 fL   MCH 29.0 26.0 - 34.0 pg   MCHC 33.7 30.0 - 36.0 g/dL   RDW 14.4 11.5 - 15.5 %   Platelets 332 150 - 400 K/uL   nRBC 0.0 0.0 - 0.2 %   Neutrophils Relative % 58 %   Neutro Abs 2.6 1.7 - 7.7 K/uL   Lymphocytes Relative 22 %   Lymphs Abs 1.0 0.7 - 4.0 K/uL   Monocytes Relative 13 %   Monocytes Absolute 0.6 0 - 1 K/uL   Eosinophils Relative 5 %   Eosinophils Absolute 0.2 0 - 0 K/uL   Basophils Relative 1 %   Basophils Absolute 0.1 0 -  0 K/uL   Immature Granulocytes 1 %   Abs Immature Granulocytes 0.04 0.00 - 0.07 K/uL    Comment: Performed at [facility], [address] [place], [place] [address]  Comprehensive metabolic panel     Status: Abnormal   Collection Time: 09/16/19 10:55 AM  Result Value Ref Range   Sodium 139 135 - 145 mmol/L   Potassium 4.1 3.5 - 5.1 mmol/L   Chloride 103 98 - 111 mmol/L   CO2 27 22 - 32 mmol/L   Glucose, Bld 103 (H) 70 - 99 mg/dL    Comment: Glucose reference range applies only to samples taken after fasting for at least 8 hours.   BUN 26 (H) 8 - 23 mg/dL   Creatinine, Ser 0.73 0.61 - 1.24 mg/dL   Calcium 9.2 8.9 - 10.3 mg/dL   Total Protein 7.2 6.5 - 8.1 g/dL   Albumin 4.1 3.5 - 5.0 g/dL   AST 26 15 - 41 U/L   ALT 29 0 - 44 U/L   Alkaline Phosphatase 81 38 - 126 U/L   Total Bilirubin 0.5 0.3 - 1.2 mg/dL   GFR calc non Af Amer >60 >60 mL/min   GFR calc Af Amer >60 >60 mL/min   Anion gap 9 5 - 15    Comment: Performed at [facility], [address] [place],  [address]  CEA     Status: None   Collection Time: 09/16/19 10:55 AM  Result Value Ref Range   CEA 2.1 0.0 - 4.7 ng/mL    Comment: (NOTE)                             Nonsmokers          <3.9                             Smokers             <5.6 [employer] Electrochemiluminescence Immunoassay (ECLIA) Values obtained with different assay  methods or kits cannot be used interchangeably.  Results cannot be interpreted as absolute evidence of the presence or absence of malignant disease. Performed At: [facility] [place], [place] [id number] Rush Farmer MD MW:[id number]      PHQ2/9: Depression screen [facility] 2/9 10/15/2019 01/15/2019 12/25/2018 03/22/2018 09/21/2017  Decreased Interest 0 0 0 0 0  Down, Depressed, Hopeless 0 0 0 0 0  PHQ - 2 Score 0 0 0 0 0  Altered sleeping - - - 0 0  Tired, decreased energy - - - 0 0  Change in appetite - - - 0 0  Feeling bad or  failure about yourself  - - - 0 0  Trouble concentrating - - - 0 0  Moving slowly or fidgety/restless - - - 0 0  Suicidal thoughts - - - 0 0  PHQ-9 Score - - - 0 0  Difficult doing work/chores - - - Not difficult at all Not difficult at all  Some recent data might be hidden    Fall Risk: Fall Risk  10/15/2019 01/15/2019 12/25/2018 12/02/2018 10/16/2018  Falls in the past year? 0 0 0 0 0  Number falls in past yr: 0 - - - -  Injury with Fall? 0 - - - -  Follow up Falls evaluation completed Falls evaluation completed Falls evaluation completed - -     Functional Status Survey: Is the patient deaf or have difficulty hearing?: No Does the patient have difficulty seeing, even when wearing glasses/contacts?: No Does the patient have difficulty concentrating, remembering, or making decisions?: No Does the patient have difficulty walking or climbing stairs?: Yes Does the patient have difficulty dressing or bathing?: No Does the patient have difficulty doing errands alone such as visiting a doctor's office or shopping?: No    Assessment & Plan  General Health/Health Maintenance:   -Prostate cancer screening and PSA options (with potential risks and benefits of testing vs not testing) were discussed along with recent recs/guidelines.  -USPSTF grade A and B recommendations reviewed with patient; age-appropriate recommendations, preventive care, screening tests, etc discussed and encouraged; healthy living encouraged; see AVS for any further patient education given to patient  -Discussed importance of regular physical activity weekly,   -Discussed importance of eating healthy diet:     -Reviewed Health Maintenance:  Adult vaccines due  Topic Date Due  . TETANUS/TDAP  02/20/2026     1. Spasm of muscle of lower back/prior lumbar disc disease and surgery Has been up and about and active with coaching, and notes occasionally suffers from some low back spasms.  Has used Flexeril in the past  with success, and requested a refill today. Informed him it may make him drowsy, and he was aware that, and does not drive after taking, takes mainly at nighttime and encouraged that to continue. Did refill that for him today to take as needed, mainly take at bedtime as needed recommended. - cyclobenzaprine (FLEXERIL) 10 MG tablet; Take 1 tablet (10 mg total) by mouth 3 (three) times daily as needed for muscle spasms. May make drowsy,not drive after taking  Dispense: 30 tablet; Refill: 2  2. Screening for lipid disorders He usually gets his labs drawn through his port when he sees oncology, and would prefer to continue to do so without having peripheral blood sticks if possible. Did feel reasonable to check a lipid panel the next time he has his blood drawn, and ordered that today - Lipid panel; Future  3. Annual  physical exam  4. Rectal cancer ([place]) Continues to follow with oncology  5. Colostomy in place [facility]) Continues to follow with oncology, with no concerns noted in the recent past  6. Mass of left lung Had an upper lobectomy to treat a mucin producing metastatic tumor from the rectal carcinoma. Notes still gets some dyspnea with exertion mostly, although has been relatively stable and not increased in the recent past Has plan continue to follow-up with cardiothoracic surgery as well.  7. Seasonal allergies No recent increase symptoms noted in recent past  8. Glaucoma of right eye, unspecified glaucoma type Linna Hoff is to follow-up with [facility] to help  9. Degeneration of lumbar intervertebral disc As above, having some recent spasms intermittently noted  10. Hypogonadism in male 55. Peyronie's disease Has been followed by urology, although has not seen them in the more recent past. He does plan to follow-up with them again here in the near future. His last PSA check was reasonable as above noted  12. Tobacco use Does use chewing tobacco, and a rare cigar noted on  the golf [place]. Is aware that last tobacco use is best.  Await the lipid panel done in the future when he has his next blood draw and recommended he get that fasting if possible. Tentatively, can follow-up on a yearly basis, with continued close follow-up with his specialists as above noted.  Follow-up sooner as needed.   Shiela Mayer, MD

## 2019-10-15 ENCOUNTER — Ambulatory Visit: Payer: 59 | Admitting: Internal Medicine

## 2019-10-15 ENCOUNTER — Other Ambulatory Visit: Payer: Self-pay | Admitting: Internal Medicine

## 2019-10-15 ENCOUNTER — Other Ambulatory Visit: Payer: Self-pay

## 2019-10-15 ENCOUNTER — Encounter: Payer: Self-pay | Admitting: Internal Medicine

## 2019-10-15 VITALS — BP 124/66 | HR 108 | Temp 98.3°F | Resp 16 | Ht 75.0 in | Wt 281.0 lb

## 2019-10-15 DIAGNOSIS — E291 Testicular hypofunction: Secondary | ICD-10-CM

## 2019-10-15 DIAGNOSIS — H409 Unspecified glaucoma: Secondary | ICD-10-CM

## 2019-10-15 DIAGNOSIS — M62830 Muscle spasm of back: Secondary | ICD-10-CM | POA: Diagnosis not present

## 2019-10-15 DIAGNOSIS — C20 Malignant neoplasm of rectum: Secondary | ICD-10-CM | POA: Diagnosis not present

## 2019-10-15 DIAGNOSIS — Z13220 Encounter for screening for lipoid disorders: Secondary | ICD-10-CM

## 2019-10-15 DIAGNOSIS — Z23 Encounter for immunization: Secondary | ICD-10-CM | POA: Diagnosis not present

## 2019-10-15 DIAGNOSIS — M5136 Other intervertebral disc degeneration, lumbar region: Secondary | ICD-10-CM | POA: Diagnosis not present

## 2019-10-15 DIAGNOSIS — J302 Other seasonal allergic rhinitis: Secondary | ICD-10-CM | POA: Diagnosis not present

## 2019-10-15 DIAGNOSIS — N486 Induration penis plastica: Secondary | ICD-10-CM | POA: Insufficient documentation

## 2019-10-15 DIAGNOSIS — Z933 Colostomy status: Secondary | ICD-10-CM

## 2019-10-15 DIAGNOSIS — Z720 Tobacco use: Secondary | ICD-10-CM

## 2019-10-15 DIAGNOSIS — R918 Other nonspecific abnormal finding of lung field: Secondary | ICD-10-CM | POA: Diagnosis not present

## 2019-10-15 DIAGNOSIS — Z0000 Encounter for general adult medical examination without abnormal findings: Secondary | ICD-10-CM

## 2019-10-15 MED ORDER — CYCLOBENZAPRINE HCL 10 MG PO TABS: 10.0000 mg | ORAL_TABLET | Freq: Three times a day (TID) | ORAL | 2 refills | Status: DC | PRN

## 2019-10-15 NOTE — Patient Instructions (Addendum)
Please check a lipid panel with your next blood draw through oncology.  The flexeril medicine was refilled

## 2019-10-23 MED FILL — AMLODIPINE BESYLATE 10 MG T: 10 | 90 days supply | Qty: 90 | Fill #0

## 2019-10-27 ENCOUNTER — Other Ambulatory Visit: Payer: Self-pay

## 2019-10-27 ENCOUNTER — Inpatient Hospital Stay: Payer: 59 | Attending: Oncology

## 2019-10-27 DIAGNOSIS — C20 Malignant neoplasm of rectum: Secondary | ICD-10-CM | POA: Insufficient documentation

## 2019-10-27 DIAGNOSIS — Z452 Encounter for adjustment and management of vascular access device: Secondary | ICD-10-CM | POA: Insufficient documentation

## 2019-10-27 DIAGNOSIS — Z95828 Presence of other vascular implants and grafts: Secondary | ICD-10-CM

## 2019-10-27 MED ORDER — SODIUM CHLORIDE 0.9% FLUSH
10.0000 mL | Freq: Once | INTRAVENOUS | Status: AC
  Administered 2019-10-27: 10 mL via INTRAVENOUS
  Filled 2019-10-27: qty 10

## 2019-10-27 MED ORDER — HEPARIN SOD (PORK) LOCK FLUSH 100 UNIT/ML IV SOLN
INTRAVENOUS | Status: AC
  Filled 2019-10-27: qty 5

## 2019-10-27 MED ORDER — HEPARIN SOD (PORK) LOCK FLUSH 100 UNIT/ML IV SOLN
500.0000 [IU] | Freq: Once | INTRAVENOUS | Status: AC
  Administered 2019-10-27: 500 [IU] via INTRAVENOUS
  Filled 2019-10-27: qty 5

## 2019-11-07 ENCOUNTER — Inpatient Hospital Stay: Payer: 59

## 2019-11-21 ENCOUNTER — Other Ambulatory Visit: Payer: Self-pay | Admitting: Oncology

## 2019-11-21 ENCOUNTER — Encounter: Payer: Self-pay | Admitting: Oncology

## 2019-11-21 MED ORDER — ALBUTEROL SULFATE HFA 108 (90 BASE) MCG/ACT IN AERS: INHALATION_SPRAY | RESPIRATORY_TRACT | 1 refills | Status: DC

## 2019-11-21 MED FILL — ALBUTEROL SULFATE HFA 108 (: 108 (90 BAS | 25 days supply | Qty: 18 | Fill #0

## 2019-11-26 MED FILL — CYCLOBENZAPRINE HCL 10 MG T: 10 | 10 days supply | Qty: 30 | Fill #0

## 2019-11-28 ENCOUNTER — Ambulatory Visit: Payer: 59 | Admitting: Urology

## 2019-12-03 ENCOUNTER — Other Ambulatory Visit: Payer: Self-pay

## 2019-12-03 ENCOUNTER — Ambulatory Visit (INDEPENDENT_AMBULATORY_CARE_PROVIDER_SITE_OTHER): Payer: 59 | Admitting: Urology

## 2019-12-03 ENCOUNTER — Encounter: Payer: Self-pay | Admitting: Urology

## 2019-12-03 VITALS — BP 158/86 | HR 98 | Ht 75.0 in | Wt 275.0 lb

## 2019-12-03 DIAGNOSIS — N5201 Erectile dysfunction due to arterial insufficiency: Secondary | ICD-10-CM | POA: Diagnosis not present

## 2019-12-03 DIAGNOSIS — E291 Testicular hypofunction: Secondary | ICD-10-CM | POA: Diagnosis not present

## 2019-12-03 NOTE — Progress Notes (Signed)
12/03/2019 10:19 AM   [name] [name] May 03, 1955 [id number]  Referring provider: Hubbard Hartshorn, [place],  [place] [address]  Chief Complaint  Patient presents with  . Hypogonadism    HPI: 64 y.o. male presents for follow-up of hypogonadism and erectile dysfunction.   Last seen May 2020 via a telehealth visit  Had recurrent rectal cancer diagnosed stage IV and has been unable to follow-up as scheduled  Using tadalafil for erectile dysfunction  Has been off testosterone since 2020 and does note recurrent symptoms of tiredness and fatigue  Testosterone level May 2021 was 176; PSA May 2021 2.56  Occasional urge incontinence, not bothersome  Denies dysuria, gross hematuria   PMH: Past Medical History:  Diagnosis Date  . Anemia   . Cancer [facility])    rectal 2015, followed by Oncology, permanent colostomy  . Colostomy in place [facility])   . Dyspnea    on exertion  . History of kidney stones   . Pneumonia   . Seasonal allergies     Surgical History: Past Surgical History:  Procedure Laterality Date  . BACK SURGERY    . CHOLECYSTECTOMY    . COLON SURGERY    . ENDOBRONCHIAL ULTRASOUND N/A 07/01/2018   Procedure: ENDOBRONCHIAL ULTRASOUND;  Surgeon: Tyler Pita, MD;  Location: [facility];  Service: Cardiopulmonary;  Laterality: N/A;  . FLEXIBLE BRONCHOSCOPY Left 11/20/2018   Procedure: FLEXIBLE BRONCHOSCOPY;  Surgeon: Tyler Pita, MD;  Location: [facility];  Service: Cardiopulmonary;  Laterality: Left;  . INTERCOSTAL NERVE BLOCK Left 02/03/2019   Procedure: INTERCOSTAL NERVE BLOCK WITH EXPAREL;  Surgeon: Grace Isaac, MD;  Location: [place];  Service: Thoracic;  Laterality: Left;  . KNEE SURGERY Bilateral   . LOBECTOMY Left 02/03/2019   Procedure: LEFT UPPER LOBECTOMY WITH LYMPH NODE DISSECTION;  Surgeon: Grace Isaac, MD;  Location: [place];  Service: Thoracic;  Laterality: Left;  . PORT A CATH REVISION Left 08/05/2018    Procedure: PORT A CATH REVISION, REPOSITION LEFT;  Surgeon: Robert Bellow, MD;  Location: [facility];  Service: General;  Laterality: Left;  . PORT-A-CATH REMOVAL Right 12/02/2014   Procedure: REMOVAL PORT-A-CATH;  Surgeon: Marlyce Huge, MD;  Location: [facility];  Service: General;  Laterality: Right;  . PORTACATH PLACEMENT    . PORTACATH PLACEMENT Left 07/29/2018   Procedure: INSERTION PORT-A-CATH LEFT;  Surgeon: Robert Bellow, MD;  Location: [facility];  Service: General;  Laterality: Left;  . THORACOTOMY/LOBECTOMY Left 02/03/2019   Procedure: MINITHORACOTOMY;  Surgeon: Grace Isaac, MD;  Location: [place];  Service: Thoracic;  Laterality: Left;  [name] VIDEO ASSISTED THORACOSCOPY (VATS)/WEDGE RESECTION Left 02/03/2019   Procedure: VIDEO ASSISTED THORACOSCOPY (VATS)/LUNG RESECTION;  Surgeon: Grace Isaac, MD;  Location: [place];  Service: Thoracic;  Laterality: Left;  [name] VIDEO BRONCHOSCOPY N/A 02/03/2019   Procedure: VIDEO BRONCHOSCOPY;  Surgeon: Grace Isaac, MD;  Location: [place];  Service: Thoracic;  Laterality: N/A;  . VIDEO BRONCHOSCOPY WITH ENDOBRONCHIAL ULTRASOUND N/A 12/30/2018   Procedure: VIDEO BRONCHOSCOPY WITH ENDOBRONCHIAL ULTRASOUND;  Surgeon: Grace Isaac, MD;  Location: [place];  Service: Thoracic;  Laterality: N/A;    Home Medications:  Allergies as of 12/03/2019      Reactions   Sulfa Antibiotics Hives   Morphine And Related Other (See Comments)   Altered mental status      Medication List       Accurate as of December 03, 2019 10:19 AM. If you  have any questions, ask your nurse or doctor.        AFRIN EXTRA MOISTURIZING NA Place 1 spray into the nose 2 (two) times daily as needed (congestion.).   albuterol 108 (90 Base) MCG/ACT inhaler Commonly known as: VENTOLIN HFA INHALE 2 PUFFS INTO THE LUNGS EVERY 6 HOURS AS NEEDED FOR WHEEZING OR SHORTNESS OF BREATH.   amLODipine 10 MG tablet Commonly known as: NORVASC Take 1 tablet (10 mg total) by  mouth daily.   aspirin-acetaminophen-caffeine 250-250-65 MG tablet Commonly known as: EXCEDRIN MIGRAINE Take 2 tablets by mouth every 6 (six) hours as needed for headache.   aspirin-sod bicarb-citric acid 325 MG Tbef tablet Commonly known as: ALKA-SELTZER Take 650 mg by mouth every 6 (six) hours as needed (heartburn).   cetirizine 10 MG tablet Commonly known as: ZYRTEC Take 10 mg by mouth at bedtime.   cyclobenzaprine 10 MG tablet Commonly known as: FLEXERIL Take 1 tablet (10 mg total) by mouth 3 (three) times daily as needed for muscle spasms. May make drowsy,not drive after taking   diclofenac Sodium 1 % Gel Commonly known as: VOLTAREN Apply 1 application topically 4 (four) times daily as needed (knee pain).   Imodium A-D 2 MG capsule Generic drug: loperamide Take 4 mg by mouth as needed for diarrhea or loose stools.   multivitamin with minerals Tabs tablet Take 1 tablet by mouth every evening.   tadalafil 20 MG tablet Commonly known as: CIALIS Take 1 tablet (20 mg total) by mouth daily as needed for erectile dysfunction.   timolol 0.5 % ophthalmic solution Commonly known as: BETIMOL Place 1 drop into the right eye at bedtime.   VISINE-A OP Place 1 drop into both eyes daily as needed (allergies).       Allergies:  Allergies  Allergen Reactions  . Sulfa Antibiotics Hives  . Morphine And Related Other (See Comments)    Altered mental status    Family History: Family History  Problem Relation Age of Onset  . Cancer Mother   . Hypertension Mother   . COPD Mother   . COPD Father   . Cancer Father        Prostate CA, on Lupron  . Stroke Maternal Grandmother   . Cancer Paternal Grandmother   . Heart attack Paternal Grandfather   . Cancer Paternal Grandfather     Social History:  reports that he has never smoked. His smokeless tobacco use includes chew. He reports current alcohol use. He reports that he does not use drugs.   Physical Exam: BP (!) 158/86    Pulse 98   Ht _0  (1.905 m)   Wt 275 lb (124.7 kg)   BMI 34.37 kg/m   Constitutional:  Alert and oriented, No acute distress. HEENT: [place] AT, moist mucus membranes.  Trachea midline, no masses. Cardiovascular: No clubbing, cyanosis, or edema. Respiratory: Normal respiratory effort, no increased work of breathing. Skin: No rashes, bruises or suspicious lesions. Neurologic: Grossly intact, no focal deficits, moving all 4 extremities. Psychiatric: Normal mood and affect.   Assessment & Plan:    1.  Erectile dysfunction  Tadalafil 20 mg refilled  2.  Hypogonadism  Desires to restart TRT  Rx testosterone sent to pharmacy  Follow-up testosterone level 6 weeks and he indicated he would have this done in the cancer center  Schedule office follow-up 6 months    Abbie Sons, MD  [facility] [address] [place], [place] [address] [phone]

## 2019-12-04 ENCOUNTER — Encounter: Payer: Self-pay | Admitting: Urology

## 2019-12-04 ENCOUNTER — Other Ambulatory Visit (HOSPITAL_COMMUNITY): Payer: Self-pay | Admitting: Urology

## 2019-12-04 ENCOUNTER — Encounter: Payer: Self-pay | Admitting: *Deleted

## 2019-12-04 MED ORDER — NEEDLE (DISP) 18G X 1" MISC
0 refills | Status: DC
Start: 2019-12-04 — End: 2019-12-04

## 2019-12-04 MED ORDER — TADALAFIL 20 MG PO TABS: ORAL_TABLET | ORAL | 3 refills | Status: DC

## 2019-12-04 MED ORDER — TESTOSTERONE CYPIONATE 200 MG/ML IM SOLN
200.0000 mg | INTRAMUSCULAR | 0 refills | Status: DC
Start: 2019-12-04 — End: 2019-12-04

## 2019-12-04 MED ORDER — LUER LOCK SAFETY SYRINGES 21G X 1-1/2" 3 ML MISC
0 refills | Status: DC
Start: 2019-12-04 — End: 2021-12-13

## 2019-12-04 MED FILL — TADALAFIL 20 MG TABS: 20 | 30 days supply | Qty: 6 | Fill #0

## 2019-12-04 MED FILL — CYCLOBENZAPRINE HCL 10 MG T: 10 | 10 days supply | Qty: 30 | Fill #1

## 2019-12-05 MED FILL — TADALAFIL 20 MG TABS: 20 | 30 days supply | Qty: 24 | Fill #1

## 2019-12-05 MED FILL — BD 3 ML SYR/NDLE 22G/1-1.5: 22G X 1-1/2 | 84 days supply | Qty: 6 | Fill #0

## 2019-12-10 ENCOUNTER — Telehealth: Payer: Self-pay

## 2019-12-10 NOTE — Telephone Encounter (Signed)
Waiting for his pharmacy to send to coveer my meds.

## 2019-12-10 NOTE — Telephone Encounter (Signed)
Patient called stating his testosterone needs prior authorization

## 2019-12-11 MED FILL — ALBUTEROL SULFATE HFA 108 (: 108 (90 BAS | 25 days supply | Qty: 18 | Fill #1

## 2019-12-15 NOTE — Telephone Encounter (Addendum)
Talked with patient and we are still waiting for [employer] to put it in cover my meds.

## 2019-12-15 NOTE — Telephone Encounter (Signed)
Pt calling stating his testosterone needs prior auth and he called the insurance and they are waiting on [place]. Please advise

## 2019-12-16 ENCOUNTER — Encounter: Payer: Self-pay | Admitting: Oncology

## 2019-12-22 ENCOUNTER — Telehealth: Payer: Self-pay | Admitting: *Deleted

## 2019-12-22 ENCOUNTER — Telehealth: Payer: Self-pay

## 2019-12-22 NOTE — Telephone Encounter (Signed)
Talked with [employer] [facility] and they put it to cover my meds. I filled out the infor needed.

## 2019-12-22 NOTE — Telephone Encounter (Signed)
Pt calls triage line and states that he is still waiting for PA to be completed on testosterone RX. Pt states that he just talked to the [employer] and they state they have faxed the form 4 times, but will fax again now. [place] [phone].

## 2019-12-22 NOTE — Telephone Encounter (Signed)
Testosterone approved 12/22/2019 thro 12/20/2020

## 2019-12-25 MED FILL — TESTOSTERONE CYP 200 MG/ML: 200 | 84 days supply | Qty: 6 | Fill #0

## 2019-12-26 ENCOUNTER — Inpatient Hospital Stay: Payer: 59 | Attending: Oncology

## 2019-12-26 ENCOUNTER — Other Ambulatory Visit: Payer: Self-pay

## 2019-12-26 DIAGNOSIS — Z452 Encounter for adjustment and management of vascular access device: Secondary | ICD-10-CM | POA: Insufficient documentation

## 2019-12-26 DIAGNOSIS — Z95828 Presence of other vascular implants and grafts: Secondary | ICD-10-CM

## 2019-12-26 DIAGNOSIS — C20 Malignant neoplasm of rectum: Secondary | ICD-10-CM | POA: Diagnosis not present

## 2019-12-26 MED ORDER — HEPARIN SOD (PORK) LOCK FLUSH 100 UNIT/ML IV SOLN
500.0000 [IU] | Freq: Once | INTRAVENOUS | Status: AC
  Administered 2019-12-26: 500 [IU] via INTRAVENOUS
  Filled 2019-12-26: qty 5

## 2019-12-26 MED ORDER — SODIUM CHLORIDE 0.9% FLUSH
10.0000 mL | Freq: Once | INTRAVENOUS | Status: AC
  Administered 2019-12-26: 10 mL via INTRAVENOUS
  Filled 2019-12-26: qty 10

## 2019-12-27 MED FILL — BD NEEDLES 18G 1: 18G X 1" | 84 days supply | Qty: 6 | Fill #0

## 2020-01-14 MED FILL — TADALAFIL 20 MG TABS: 20 | 30 days supply | Qty: 24 | Fill #2

## 2020-01-14 MED FILL — AMLODIPINE BESYLATE 10 MG T: 10 | 90 days supply | Qty: 90 | Fill #1

## 2020-02-05 ENCOUNTER — Ambulatory Visit (INDEPENDENT_AMBULATORY_CARE_PROVIDER_SITE_OTHER): Payer: 59 | Admitting: Cardiothoracic Surgery

## 2020-02-05 ENCOUNTER — Other Ambulatory Visit: Payer: Self-pay

## 2020-02-05 ENCOUNTER — Encounter: Payer: Self-pay | Admitting: Cardiothoracic Surgery

## 2020-02-05 VITALS — BP 144/79 | HR 113 | Resp 20 | Ht 75.0 in | Wt 297.0 lb

## 2020-02-05 DIAGNOSIS — R911 Solitary pulmonary nodule: Secondary | ICD-10-CM

## 2020-02-05 NOTE — Progress Notes (Signed)
[address]       [place] [address]             [phone]                  [facility] Record #[id number] Date of Birth: 07/10/1955  Referring MV:HQIO, Christia Reading, MD Primary Cardiology: Primary Care:Boyce, Astrid Divine, FNP Oncology: Dr. Lilian Kapur  Chief Complaint:  Follow Up Visit OPERATIVE REPORT DATE OF PROCEDURE:  02/03/2019 PREOPERATIVE DIAGNOSIS:  Left upper lobe lung mass, question isolated pulmonary metastasis from rectal carcinoma, question fungus ball. POSTOPERATIVE DIAGNOSIS:  Left upper lobe lung mass,final path and micro pending. SURGICAL PROCEDURE:  Bronchoscopy, left video-assisted thoracoscopy, mini thoracotomy, left upper lobectomy with lymph node dissection, intercostal nerve block with Exparel.  Cancer Staging Rectal cancer [facility]) Staging form: Colon and Rectum, AJCC 7th Edition - Clinical stage from 08/05/2014: Stage IVA (T2, N0, M1a) - Signed by Lloyd Huger, MD on 07/06/2018   History of Present Illness:   Patient returns to the office today in follow-up after left upper lobectomy for resection of isolated pulmonary metastasis of mucin producing rectal carcinoma.  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  He does note some shortness of breath with exertion but these symptoms appear stable.     He is followed closely in the oncology office in [place] Use  Smoking Status Never Smoker  Smokeless Tobacco Current User  . Types: Chew  Tobacco Comment   might smoke a cigar once every 3 months       Allergies  Allergen Reactions  . Sulfa Antibiotics Hives  . Morphine And Related Other (See Comments)    Altered mental status    Current Outpatient Medications  Medication Sig Dispense Refill  . albuterol (VENTOLIN HFA) 108 (90 Base) MCG/ACT inhaler INHALE 2 PUFFS INTO THE LUNGS EVERY 6 HOURS AS NEEDED  FOR WHEEZING OR SHORTNESS OF BREATH. 18 g 1  . amLODipine (NORVASC) 10 MG tablet Take 1 tablet (10 mg total) by mouth daily. 90 tablet 3  . aspirin-acetaminophen-caffeine (EXCEDRIN MIGRAINE) 250-250-65 MG tablet Take 2 tablets by mouth every 6 (six) hours as needed for headache.    [name] aspirin-sod bicarb-citric acid (ALKA-SELTZER) 325 MG TBEF tablet Take 650 mg by mouth every 6 (six) hours as needed (heartburn).    . cetirizine (ZYRTEC) 10 MG tablet Take 10 mg by mouth at bedtime.     . cyclobenzaprine (FLEXERIL) 10 MG tablet Take 1 tablet (10 mg total) by mouth 3 (three) times daily as needed for muscle spasms. May make drowsy,not drive after taking 30 tablet 2  . diclofenac Sodium (VOLTAREN) 1 % GEL Apply 1 application topically 4 (four) times daily as needed (knee pain).    [name] loperamide (IMODIUM) 2 MG capsule Take 4 mg by mouth as needed for diarrhea or loose stools.     . Multiple Vitamin (MULTIVITAMIN WITH MINERALS) TABS tablet Take 1 tablet by mouth every evening.     [name] Naphazoline-Pheniramine (VISINE-A OP) Place 1 drop into both eyes daily as needed (allergies).    . NEEDLE, DISP, 18 G 18G X 1" MISC Use as directed 50 each 0  . Oxymetazoline HCl (AFRIN EXTRA MOISTURIZING NA) Place 1 spray into the nose 2 (two) times daily as needed (congestion.).    [name] SYRINGE-NEEDLE, DISP, 3  ML (LUER LOCK SAFETY SYRINGES) 21G X 1-1/2" 3 ML MISC Use as directed for testosterone administration 50 each 0  . tadalafil (CIALIS) 20 MG tablet 1 tab 1 hour prior to intercourse as needed 30 tablet 3  . testosterone cypionate (DEPOTESTOSTERONE CYPIONATE) 200 MG/ML injection Inject 1 mL (200 mg total) into the muscle every 14 (fourteen) days. 10 mL 0  . timolol (BETIMOL) 0.5 % ophthalmic solution Place 1 drop into the right eye at bedtime.      No current facility-administered medications for this visit.       Physical Exam: BP (!) 144/79 (BP Location: Right Arm, Patient Position: Sitting)   Pulse (!) 113   Resp  20   Ht 6\' 3"  (1.905 m)   Wt 297 lb (134.7 kg)   SpO2 94% Comment: RA with mask on  BMI 37.12 kg/m   General appearance: alert, cooperative and no distress Head: Normocephalic, without obvious abnormality, atraumatic Neck: no adenopathy, no carotid bruit, no JVD, supple, symmetrical, trachea midline and thyroid not enlarged, symmetric, no tenderness/mass/nodules Lymph nodes: Cervical, supraclavicular, and axillary nodes normal. Resp: clear to auscultation bilaterally Cardio: regular rate and rhythm, S1, S2 normal, no murmur, click, rub or gallop GI: soft, non-tender; bowel sounds normal; no masses,  no organomegaly- left lower colostomy  Extremities: extremities normal, atraumatic, no cyanosis or edema and Homans sign is negative, no sign of DVT Neurologic: Grossly normal  Diagnostic Studies & Laboratory data:         Recent Radiology Findings: CLINICAL DATA:  Restaging metastatic rectal cancer  EXAM: CT CHEST, ABDOMEN, AND PELVIS WITH CONTRAST  TECHNIQUE: Multidetector CT imaging of the chest, abdomen and pelvis was performed following the standard protocol during bolus administration of intravenous contrast.  CONTRAST:  185mL OMNIPAQUE IOHEXOL 300 MG/ML SOLN, additional oral enteric contrast  COMPARISON:  CT chest, 05/26/2019, PET-CT, 11/04/2018  FINDINGS: CT CHEST FINDINGS  Cardiovascular: Left chest port catheter. Aortic atherosclerosis. Normal heart size. Three-vessel coronary artery calcifications. No pericardial effusion.  Mediastinum/Nodes: No enlarged mediastinal, hilar, or axillary lymph nodes. Thyroid gland, trachea, and esophagus demonstrate no significant findings.  Lungs/Pleura: Status post left upper lobectomy. No pleural effusion or pneumothorax.  Musculoskeletal: No chest wall mass or suspicious bone lesions identified.  CT ABDOMEN PELVIS FINDINGS  Hepatobiliary: No focal liver abnormality is seen. Status post cholecystectomy. No  biliary dilatation.  Pancreas: Unremarkable. No pancreatic ductal dilatation or surrounding inflammatory changes.  Spleen: Normal in size without significant abnormality.  Adrenals/Urinary Tract: Adrenal glands are unremarkable. Tiny nonobstructive inferior pole calculus of the left kidney. Bladder is unremarkable.  Stomach/Bowel: Stomach is within normal limits. Redemonstrated postoperative findings of abdominoperineal resection and left lower quadrant end colostomy. Diverticula of the remnant descending and sigmoid colon without evidence of acute diverticulitis. No change in post operative and post treatment appearance of soft tissue in the low pelvis, not previously PET avid (series 2, image 122).  Vascular/Lymphatic: Aortic atherosclerosis. No enlarged abdominal or pelvic lymph nodes.  Reproductive: No mass or other abnormality.  Other: No abdominal wall hernia or abnormality. No abdominopelvic ascites.  Musculoskeletal: No acute or significant osseous findings.  IMPRESSION: 1. Redemonstrated postoperative findings of abdominoperineal resection and left lower quadrant end colostomy. No change in post operative and post treatment appearance of soft tissue in the low pelvis, not previously PET avid. No evidence of recurrent or metastatic disease in the abdomen or pelvis. 2. Status post left upper lobectomy. No evidence of recurrent or metastatic disease in the chest.  3. Nonobstructive left nephrolithiasis. 4. Coronary artery disease. Aortic Atherosclerosis (ICD10-I70.0).   Electronically Signed   By: Eddie Candle M.D.   On: 09/16/2019 14:34   I have independently reviewed the above radiology findings and reviewed findings  with the patient.  Recent Labs: Lab Results  Component Value Date   WBC 4.5 09/16/2019   HGB 12.6 (L) 09/16/2019   HCT 37.4 (L) 09/16/2019   PLT 332 09/16/2019   GLUCOSE 103 (H) 09/16/2019   CHOL 147 03/22/2018   TRIG 126  03/22/2018   HDL 35 (L) 03/22/2018   LDLCALC 89 03/22/2018   ALT 29 09/16/2019   AST 26 09/16/2019   NA 139 09/16/2019   K 4.1 09/16/2019   CL 103 09/16/2019   CREATININE 0.73 09/16/2019   BUN 26 (H) 09/16/2019   CO2 27 09/16/2019   TSH 3.96 07/03/2016   INR 0.9 01/30/2019   Pathology: FINAL MICROSCOPIC DIAGNOSIS:   A. LYMPH NODE, 5, BIOPSY:  - One lymph node, negative for carcinoma (0/1).   B. LYMPH NODE, 10L, BIOPSY:  - One lymph node, negative for carcinoma (0/1).   C. LUNG, LEFT UPPER LOBE, LOBECTOMY:  - Adenocarcinoma with extracellular mucin, 10.3 cm.  - Margins of resection are not involved.  - See comment.   D. LYMPH NODE, 11L, BIOPSY:  - One lymph node, negative for carcinoma (0/1).   E. LYMPH NODE, 11L #2, BIOPSY:  - One lymph node, negative for carcinoma (0/1).   F. LYMPH NODE, 11L #3, BIOPSY:  - Fragment of adenocarcinoma.  - Detached fragment of fibroadipose tissue.  - No distinct nodal tissue identified.   G. LYMPH NODE, 12L, BIOPSY:  - One lymph node, negative for carcinoma (0/1).   H. LYMPH NODE, 4L, BIOPSY:  - One lymph node, negative for carcinoma (0/1).   I. LYMPH NODE, 8, BIOPSY:  - One lymph node, negative for carcinoma (0/1).   COMMENT:   C. By morphology and review of the record, the findings are compatible  with the provided clinical history of colorectal carcinoma.   INTRAOPERATIVE DIAGNOSIS:    C. Left upper lobe bronchial margin: "Negative for neoplasm."  Intraoperative diagnosis rendered by Dr. Jeannie Done at 3:20 PM on  02/03/2019.   GROSS DESCRIPTION:   A: Received fresh is a 0.8 cm anthracotic lymph node. Submitted in toto  in 1 cassette.   B: Received fresh is a 1.2 cm anthracotic lymph node. Submitted in toto  in 1 cassette.   C: Specimen: Left upper lobectomy, received fresh for rapid  intraoperative consult.  Specimen integrity: The pleura is previously incised.  Size, weight: 23.5 x 15.2 x 8.7 cm, 499 g.   Pleura: The pleura is previously incised and there is mucinous material  exuding from the incision sites.  Lesion: The cut surface shows a 10.3 x 8.2 x 6.8 cm well-circumscribed  mass. The cut surface is tan-white and mucinous. The mass extends to  within 1 cm of the bronchial margin.  Margins: The bronchial margin is grossly free of tumor and is submitted  for frozen section.  Hilar vessels: Grossly free of tumor.  Nonneoplastic parenchyma: Spongy, red-brown.  Lymph nodes: None.  Block Summary: 9 blocks submitted.  1 = bronchial margin submitted for frozen section.  2 = vascular margins.  3-8 = tumor.  9 = uninvolved parenchyma.   D: Received fresh is a 1.7 cm anthracotic lymph node. Sectioned and  entirely submitted in 1 cassette.   E: Received fresh is  a 0.7 cm anthracotic lymph node. Bisected and  entirely submitted in 1 cassette.   F: Received fresh are two fragments of tan-pink soft tissue, each 0.4  cm. Submitted in toto in 1 cassette.   G: Received fresh is a 0.7 cm anthracotic lymph node. Submitted in toto  in 1 cassette.   H: Received fresh is a 1.1 cm anthracotic lymph node. Sectioned and  entirely submitted in 1 cassette.   I: Received fresh is a 1 cm anthracotic lymph node. Submitted in toto  in 1 cassette. (GRP 02/04/2019)    Assessment / Plan:   #1 patient is status post left upper lobectomy for mucin producing isolated metastasis of rectal carcinoma-postoperative the patient is doing well-CT scan done in August showed no evidence of recurrent disease-follow-up scan is been ordered through oncology in late February.  I have not made the patient a follow-up appointment with  thoracic surgery office at this point, he is closely followed with appropriate scans through the oncology practice in [place].    Medication Changes: No orders of the defined types were placed in this encounter.     Grace Isaac 02/05/2020 10:50 AM

## 2020-02-09 ENCOUNTER — Other Ambulatory Visit: Payer: Self-pay | Admitting: Internal Medicine

## 2020-02-09 ENCOUNTER — Encounter: Payer: Self-pay | Admitting: Internal Medicine

## 2020-02-09 DIAGNOSIS — M62830 Muscle spasm of back: Secondary | ICD-10-CM

## 2020-02-09 MED ORDER — CYCLOBENZAPRINE HCL 10 MG PO TABS: 10.0000 mg | ORAL_TABLET | Freq: Three times a day (TID) | ORAL | 2 refills | Status: DC | PRN

## 2020-02-09 MED FILL — CYCLOBENZAPRINE HCL 10 MG T: 10 | 10 days supply | Qty: 30 | Fill #0

## 2020-02-13 ENCOUNTER — Inpatient Hospital Stay: Payer: 59

## 2020-02-22 MED FILL — CYCLOBENZAPRINE HCL 10 MG T: 10 | 10 days supply | Qty: 30 | Fill #1

## 2020-02-23 MED FILL — TADALAFIL 20 MG TABS: 20 | 30 days supply | Qty: 24 | Fill #3

## 2020-03-01 MED FILL — CYCLOBENZAPRINE HCL 10 MG T: 10 | 10 days supply | Qty: 30 | Fill #2

## 2020-03-18 MED FILL — TADALAFIL 20 MG TABS: 20 | 30 days supply | Qty: 24 | Fill #4

## 2020-03-19 ENCOUNTER — Inpatient Hospital Stay: Payer: 59

## 2020-03-19 ENCOUNTER — Other Ambulatory Visit: Payer: Self-pay

## 2020-03-19 ENCOUNTER — Ambulatory Visit
Admission: RE | Admit: 2020-03-19 | Discharge: 2020-03-19 | Disposition: A | Discharge status: Home / Self Care | Payer: 59 | Source: Ambulatory Visit | Attending: Oncology | Admitting: Oncology

## 2020-03-19 DIAGNOSIS — I2510 Atherosclerotic heart disease of native coronary artery without angina pectoris: Secondary | ICD-10-CM | POA: Diagnosis not present

## 2020-03-19 DIAGNOSIS — J984 Other disorders of lung: Secondary | ICD-10-CM | POA: Diagnosis not present

## 2020-03-19 DIAGNOSIS — C20 Malignant neoplasm of rectum: Secondary | ICD-10-CM | POA: Insufficient documentation

## 2020-03-19 DIAGNOSIS — I700 Atherosclerosis of aorta: Secondary | ICD-10-CM | POA: Diagnosis not present

## 2020-03-19 DIAGNOSIS — Z95828 Presence of other vascular implants and grafts: Secondary | ICD-10-CM

## 2020-03-19 DIAGNOSIS — N200 Calculus of kidney: Secondary | ICD-10-CM | POA: Diagnosis not present

## 2020-03-19 LAB — CREATININE, SERUM
Creatinine, Ser: 0.97 mg/dL (ref 0.61–1.24)
GFR, Estimated: >60 mL/min (ref >60)

## 2020-03-19 MED ORDER — SODIUM CHLORIDE 0.9% FLUSH
10.0000 mL | INTRAVENOUS | Status: DC | PRN
  Administered 2020-03-19: 10 mL via INTRAVENOUS
  Filled 2020-03-19: qty 10

## 2020-03-19 MED ORDER — HEPARIN SOD (PORK) LOCK FLUSH 100 UNIT/ML IV SOLN
500.0000 [IU] | Freq: Once | INTRAVENOUS | Status: AC
  Administered 2020-03-19: 500 [IU] via INTRAVENOUS
  Filled 2020-03-19: qty 5

## 2020-03-19 MED ORDER — IOHEXOL 300 MG/ML  SOLN
100.0000 mL | Freq: Once | INTRAMUSCULAR | Status: AC | PRN
  Administered 2020-03-19: 100 mL via INTRAVENOUS

## 2020-03-20 LAB — CEA: CEA: 1.5 ng/mL (ref 0.0–4.7)

## 2020-03-20 NOTE — Progress Notes (Signed)
[place]  Telephone:([phone]) [phone] Fax:(336) [phone]  ID: [name] [name] OB: 11-Dec-1955  MR#: [id number]  CSN#:[id number]  Patient Care Team: [place] as PCP - General (Family Medicine) Marlyce Huge, MD as Attending Physician (Surgery) Lloyd Huger, MD as Consulting Physician (Oncology) Abbie Sons, MD (Urology) Eulogio Bear, MD as Consulting Physician (Ophthalmology) Flora Lipps, MD as Consulting Physician (Pulmonary Disease)   I connected with [name] [name] on 03/25/20 at  2:30 PM EST by video enabled telemedicine visit and verified that I am speaking with the correct person using two identifiers.   I discussed the limitations, risks, security and privacy concerns of performing an evaluation and management service by telemedicine and the availability of in-person appointments. I also discussed with the patient that there may be a patient responsible charge related to this service. The patient expressed understanding and agreed to proceed.   Other persons participating in the visit and their role in the encounter: Patient, MD.  Patient's location: Car. Provider's location: Clinic.  CHIEF COMPLAINT: Recurrent, stage IV rectal adenocarcinoma.  INTERVAL HISTORY: Patient agreed to video assisted telemedicine visit for further evaluation and discussion of his imaging results.  He continues to have occasional shortness of breath, but otherwise feels well and is asymptomatic.  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  He has no neurologic complaints.  He denies any weakness or fatigue. He does not complain of peripheral neuropathy today.  He denies any recent fevers or illnesses.  He denies any chest pain, cough, or hemoptysis.  He denies nausea, vomiting, constipation, or diarrhea. He has noted no blood or melena in his colostomy bag.  He has no urinary complaints.  Patient offers no  further specific complaints today.  REVIEW OF SYSTEMS:   Review of Systems  Constitutional: Negative for fever, malaise/fatigue and weight loss.  Respiratory: Positive for shortness of breath. Negative for cough.   Cardiovascular: Negative.  Negative for chest pain and leg swelling.  Gastrointestinal: Negative.  Negative for abdominal pain, blood in stool, constipation, diarrhea and melena.  Genitourinary: Negative.  Negative for dysuria.  Musculoskeletal: Negative.  Negative for back pain.  Skin: Negative.  Negative for rash.  Neurological: Negative.  Negative for tingling, sensory change, focal weakness, weakness and headaches.  Psychiatric/Behavioral: Negative.  The patient is not nervous/anxious and does not have insomnia.     As per HPI. Otherwise, a complete review of systems is negative.  PAST MEDICAL HISTORY: Past Medical History:  Diagnosis Date  . Anemia   . Cancer [facility])    rectal 2015, followed by Oncology, permanent colostomy  . Colostomy in place [facility])   . Dyspnea    on exertion  . History of kidney stones   . Pneumonia   . Seasonal allergies     PAST SURGICAL HISTORY: Past Surgical History:  Procedure Laterality Date  . BACK SURGERY    . CHOLECYSTECTOMY    . COLON SURGERY    . ENDOBRONCHIAL ULTRASOUND N/A 07/01/2018   Procedure: ENDOBRONCHIAL ULTRASOUND;  Surgeon: Tyler Pita, MD;  Location: [facility];  Service: Cardiopulmonary;  Laterality: N/A;  . FLEXIBLE BRONCHOSCOPY Left 11/20/2018   Procedure: FLEXIBLE BRONCHOSCOPY;  Surgeon: Tyler Pita, MD;  Location: [facility];  Service: Cardiopulmonary;  Laterality: Left;  . INTERCOSTAL NERVE BLOCK Left 02/03/2019   Procedure: INTERCOSTAL NERVE BLOCK WITH EXPAREL;  Surgeon: Grace Isaac, MD;  Location: [place];  Service: Thoracic;  Laterality: Left;  . KNEE SURGERY Bilateral   . LOBECTOMY Left 02/03/2019   Procedure: LEFT UPPER LOBECTOMY WITH LYMPH NODE DISSECTION;  Surgeon: Grace Isaac, MD;   Location: [place];  Service: Thoracic;  Laterality: Left;  . PORT A CATH REVISION Left 08/05/2018   Procedure: PORT A CATH REVISION, REPOSITION LEFT;  Surgeon: Robert Bellow, MD;  Location: [facility];  Service: General;  Laterality: Left;  . PORT-A-CATH REMOVAL Right 12/02/2014   Procedure: REMOVAL PORT-A-CATH;  Surgeon: Marlyce Huge, MD;  Location: [facility];  Service: General;  Laterality: Right;  . PORTACATH PLACEMENT    . PORTACATH PLACEMENT Left 07/29/2018   Procedure: INSERTION PORT-A-CATH LEFT;  Surgeon: Robert Bellow, MD;  Location: [facility];  Service: General;  Laterality: Left;  . THORACOTOMY/LOBECTOMY Left 02/03/2019   Procedure: MINITHORACOTOMY;  Surgeon: Grace Isaac, MD;  Location: [place];  Service: Thoracic;  Laterality: Left;  [name] VIDEO ASSISTED THORACOSCOPY (VATS)/WEDGE RESECTION Left 02/03/2019   Procedure: VIDEO ASSISTED THORACOSCOPY (VATS)/LUNG RESECTION;  Surgeon: Grace Isaac, MD;  Location: [place];  Service: Thoracic;  Laterality: Left;  [name] VIDEO BRONCHOSCOPY N/A 02/03/2019   Procedure: VIDEO BRONCHOSCOPY;  Surgeon: Grace Isaac, MD;  [facility];  Service: Thoracic;  Laterality: N/A;  . VIDEO BRONCHOSCOPY WITH ENDOBRONCHIAL ULTRASOUND N/A 12/30/2018   Procedure: VIDEO BRONCHOSCOPY WITH ENDOBRONCHIAL ULTRASOUND;  Surgeon: Grace Isaac, MD;  Location: [place];  Service: Thoracic;  Laterality: N/A;    FAMILY HISTORY: Father with prostate cancer.  COPD, CHF.     ADVANCED DIRECTIVES:    HEALTH MAINTENANCE: Social History   Tobacco Use  . Smoking status: Never Smoker  . Smokeless tobacco: Current User    Types: Chew  . Tobacco comment: might smoke a cigar once every 3 months  Vaping Use  . Vaping Use: Never used  Substance Use Topics  . Alcohol use: Yes    Alcohol/week: 0.0 standard drinks    Comment: RARE  . Drug use: No     Colonoscopy:  PAP:  Bone density:  Lipid panel:  Allergies  Allergen Reactions  . Sulfa Antibiotics  Hives  . Morphine And Related Other (See Comments)    Altered mental status    Current Outpatient Medications  Medication Sig Dispense Refill  . albuterol (VENTOLIN HFA) 108 (90 Base) MCG/ACT inhaler INHALE 2 PUFFS INTO THE LUNGS EVERY 6 HOURS AS NEEDED FOR WHEEZING OR SHORTNESS OF BREATH. 18 g 1  . amLODipine (NORVASC) 10 MG tablet Take 1 tablet (10 mg total) by mouth daily. 90 tablet 3  . aspirin-acetaminophen-caffeine (EXCEDRIN MIGRAINE) 250-250-65 MG tablet Take 2 tablets by mouth every 6 (six) hours as needed for headache.    [name] aspirin-sod bicarb-citric acid (ALKA-SELTZER) 325 MG TBEF tablet Take 650 mg by mouth every 6 (six) hours as needed (heartburn).    . cetirizine (ZYRTEC) 10 MG tablet Take 10 mg by mouth at bedtime.     . cyclobenzaprine (FLEXERIL) 10 MG tablet Take 1 tablet (10 mg total) by mouth 3 (three) times daily as needed for muscle spasms. May make drowsy,not drive after taking 30 tablet 2  . diclofenac Sodium (VOLTAREN) 1 % GEL Apply 1 application topically 4 (four) times daily as needed (knee pain).    [name] loperamide (IMODIUM) 2 MG capsule Take 4 mg by mouth as needed for diarrhea or loose stools.     . Multiple Vitamin (MULTIVITAMIN WITH MINERALS) TABS tablet Take 1 tablet by mouth every evening.     [name]  Naphazoline-Pheniramine (VISINE-A OP) Place 1 drop into both eyes daily as needed (allergies).    . NEEDLE, DISP, 18 G 18G X 1" MISC Use as directed 50 each 0  . Oxymetazoline HCl (AFRIN EXTRA MOISTURIZING NA) Place 1 spray into the nose 2 (two) times daily as needed (congestion.).    [name] SYRINGE-NEEDLE, DISP, 3 ML (LUER LOCK SAFETY SYRINGES) 21G X 1-1/2" 3 ML MISC Use as directed for testosterone administration 50 each 0  . tadalafil (CIALIS) 20 MG tablet 1 tab 1 hour prior to intercourse as needed 30 tablet 3  . testosterone cypionate (DEPOTESTOSTERONE CYPIONATE) 200 MG/ML injection Inject 1 mL (200 mg total) into the muscle every 14 (fourteen) days. (Patient not taking:  Reported on 03/23/2020) 10 mL 0  . timolol (BETIMOL) 0.5 % ophthalmic solution Place 1 drop into the right eye at bedtime.  (Patient not taking: Reported on 03/23/2020)     No current facility-administered medications for this visit.    OBJECTIVE: There were no vitals filed for this visit.   There is no height or weight on file to calculate BMI.    ECOG FS:0 - Asymptomatic  General: Well-developed, well-nourished, no acute distress. HEENT: Normocephalic. Neuro: Alert, answering all questions appropriately. Cranial nerves grossly intact. Psych: Normal affect.  LAB RESULTS:  Lab Results  Component Value Date   NA 139 09/16/2019   K 4.1 09/16/2019   CL 103 09/16/2019   CO2 27 09/16/2019   GLUCOSE 103 (H) 09/16/2019   BUN 26 (H) 09/16/2019   CREATININE 0.97 03/19/2020   CALCIUM 9.2 09/16/2019   PROT 7.2 09/16/2019   ALBUMIN 4.1 09/16/2019   AST 26 09/16/2019   ALT 29 09/16/2019   ALKPHOS 81 09/16/2019   BILITOT 0.5 09/16/2019   GFRNONAA >60 03/19/2020   GFRAA >60 09/16/2019    Lab Results  Component Value Date   WBC 4.5 09/16/2019   NEUTROABS 2.6 09/16/2019   HGB 12.6 (L) 09/16/2019   HCT 37.4 (L) 09/16/2019   MCV 86.0 09/16/2019   PLT 332 09/16/2019     STUDIES: CT CHEST ABDOMEN PELVIS W CONTRAST  Result Date: 03/20/2020 CLINICAL DATA:  Rectal cancer.  Restaging. EXAM: CT CHEST, ABDOMEN, AND PELVIS WITH CONTRAST TECHNIQUE: Multidetector CT imaging of the chest, abdomen and pelvis was performed following the standard protocol during bolus administration of intravenous contrast. CONTRAST:  115m OMNIPAQUE IOHEXOL 300 MG/ML  SOLN COMPARISON:  09/16/2019 FINDINGS: CT CHEST FINDINGS Cardiovascular: The heart size is normal. No substantial pericardial effusion. Coronary artery calcification is evident. Atherosclerotic calcification is noted in the wall of the thoracic aorta. Left Port-A-Cath tip is positioned at the SVC/RA junction. Mediastinum/Nodes: No mediastinal  lymphadenopathy. There is no hilar lymphadenopathy. The esophagus has normal imaging features. There is no axillary lymphadenopathy. Lungs/Pleura: Prior left upper lobectomy. Stable scarring inferior left lung. No suspicious pulmonary nodule or mass. No focal airspace consolidation. There is no evidence of pleural effusion. Musculoskeletal: No worrisome lytic or sclerotic osseous abnormality. CT ABDOMEN PELVIS FINDINGS Hepatobiliary: No suspicious focal abnormality within the liver parenchyma. 15 mm focus of enhancement in the posterior right liver (62/2) is stable comparing back to an exam from 10/29/2015, consistent with benign etiology such as vascular malformation or flash filling hemangioma. Gallbladder surgically absent. No intrahepatic or extrahepatic biliary dilation. Pancreas: No focal mass lesion. No dilatation of the main duct. No intraparenchymal cyst. No peripancreatic edema. Spleen: No splenomegaly. No focal mass lesion. Adrenals/Urinary Tract: No adrenal nodule or mass. 6 mm exophytic lesion  upper interpolar left kidney is too small to characterize. 2 mm nonobstructing stone identified lower pole left kidney. No evidence for hydroureter. The urinary bladder appears normal for the degree of distention. Stomach/Bowel: Stomach is unremarkable. No gastric wall thickening. No evidence of outlet obstruction. Duodenum is normally positioned as is the ligament of Treitz. No small bowel wall thickening. No small bowel dilatation. The terminal ileum is normal. The appendix is normal. Diverticular changes are noted in the left colon without evidence of diverticulitis. Left lower quadrant sigmoid end colostomy again noted. Postsurgical changes in the central pelvis are stable. Presacral soft tissue shows interval evolution compatible with scarring. Nodular components measured previously are less conspicuous today. Vascular/Lymphatic: There is abdominal aortic atherosclerosis without aneurysm. There is no  gastrohepatic or hepatoduodenal ligament lymphadenopathy. No retroperitoneal or mesenteric lymphadenopathy. No pelvic sidewall lymphadenopathy. Reproductive: Unremarkable. Other: No intraperitoneal free fluid. Musculoskeletal: No worrisome lytic or sclerotic osseous abnormality. IMPRESSION: 1. Stable exam. No new or progressive interval findings to suggest recurrent or metastatic disease. 2. Interval evolution of the presacral soft tissue scarring with nodular components measured previously less conspicuous today. Continued attention on follow-up recommended. 3. 2 mm nonobstructing left renal stone. 4. 6 mm exophytic lesion posterior interpolar left kidney is new in the interval. Likely a cyst. Attention on follow-up recommended. 5. Aortic Atherosclerosis (ICD10-I70.0). Electronically Signed   By: Misty Stanley M.D.   On: 03/20/2020 10:14   ONCOLOGY HISTORY:  Patient was initially considered a clinical stage IIIa, but after completing neoadjuvant 5-FU and XRT followed by surgical excision on October 08, 2013 he was noted to be pathologic stage Ia.  He declined adjuvant FOLFOX at that time.  More recently, patient's CEA started trending up and CT of the chest revealed a large 7.7 cm left upper lobe mass biopsy consistent with rectal adenocarcinoma.  Patient completed cycle 6 of FOLFOX plus Avastin on October 16, 2018.  Repeat biopsy did not reveal any evidence of malignancy, but persistent fungal infection.  Patient underwent repeat bronchoscopy on December 30, 2018 which did in fact reveal residual malignancy.  He subsequently underwent surgical resection on February 03, 2019 with complete removal of residual disease.  One lymph node had a "fragment" of malignancy.  Patient did not require additional adjuvant chemotherapy or XRT.   ASSESSMENT: Recurrent, stage IV rectal adenocarcinoma.  PLAN:    1.  Recurrent, stage IV rectal adenocarcinoma: No evidence of disease.  Patient's most recent CT scan on  March 19, 2020 reviewed independently and report as above with no obvious evidence of recurrent or progressive disease.  CEA continues to be within normal limits at 1.5.  No intervention is needed. Continue routine imaging every 6 months for a minimum of 2 years after his surgery in January 2023.  Return to clinic in 6 months with repeat imaging and video assisted telemedicine visit. 2.  Cough: Patient does not complain of this today.  Continue Tessalon Perles as needed. 4.  Insomnia: Patient does not complain of this today.  Continue Ambien as needed. 5.  Shortness of breath: Chronic and unchanged.  Continue albuterol inhaler as needed.  I provided 20 minutes of face-to-face video visit time during this encounter which included chart review, counseling, and coordination of care as documented above.   Patient expressed understanding and was in agreement with this plan. He also understands that He can call clinic at any time with any questions, concerns, or complaints.    Lloyd Huger, MD   03/25/2020 6:23  AM     

## 2020-03-23 ENCOUNTER — Encounter: Payer: Self-pay | Admitting: Oncology

## 2020-03-23 ENCOUNTER — Inpatient Hospital Stay: Payer: 59 | Attending: Oncology | Admitting: Oncology

## 2020-03-23 DIAGNOSIS — C20 Malignant neoplasm of rectum: Secondary | ICD-10-CM

## 2020-03-23 NOTE — Progress Notes (Signed)
Patient called/ pre- screened for virtual appoinment today with oncologist. No concerns at this time

## 2020-04-12 ENCOUNTER — Other Ambulatory Visit (HOSPITAL_BASED_OUTPATIENT_CLINIC_OR_DEPARTMENT_OTHER): Payer: Self-pay

## 2020-04-14 ENCOUNTER — Telehealth: Payer: Self-pay | Admitting: Family Medicine

## 2020-04-14 ENCOUNTER — Encounter: Payer: Self-pay | Admitting: Cardiothoracic Surgery

## 2020-04-14 ENCOUNTER — Other Ambulatory Visit: Payer: Self-pay | Admitting: Family Medicine

## 2020-04-14 DIAGNOSIS — I10 Essential (primary) hypertension: Secondary | ICD-10-CM

## 2020-04-14 MED ORDER — AMLODIPINE BESYLATE 10 MG PO TABS: 10.0000 mg | ORAL_TABLET | Freq: Every day | ORAL | 0 refills | Status: DC

## 2020-04-14 MED FILL — AMLODIPINE BESYLATE 10 MG T: 10 | 90 days supply | Qty: 90 | Fill #0

## 2020-04-14 NOTE — Telephone Encounter (Signed)
Requested medication (s) are due for refill today: yes  Requested medication (s) are on the active medication list:yes  Last refill:  ?  Future visit scheduled: no  Notes to clinic:  historical provider, Dr Lanelle Bal; pt previously seen by Dr Roxan Hockey   Requested Prescriptions  Pending Prescriptions Disp Refills   amLODipine (NORVASC) 10 MG tablet 90 tablet 3    Sig: Take 1 tablet (10 mg total) by mouth daily.      Cardiovascular:  Calcium Channel Blockers Failed - 04/14/2020  1:24 PM      Failed - Last BP in normal range    BP Readings from Last 1 Encounters:  02/05/20 (!) 144/79          Failed - Valid encounter within last 6 months    Recent Outpatient Visits           6 months ago Spasm of muscle of lower back   [facility] Tow[place] Malkin, MD   2 years ago Annual physical exam   [place], FNP   2 years ago Low testosterone in male   [facility] [place] [facility], [place]   3 years ago Needs flu shot   [place], MD   3 years ago Mixed hyperlipidemia   [place], [place], MD       Future Appointments             In 1 month Stoioff, Ronda Fairly, MD [place]   In 5 months [place] Ormond, Kathlene November, MD [facility]   In 6 months Towanda Malkin, MD [facility]

## 2020-04-14 NOTE — Telephone Encounter (Signed)
Medication Refill - Medication: amLODipine (NORVASC) 10 MG tablet   Has the patient contacted their pharmacy? No. (Agent: If no, request that the patient contact the pharmacy for the refill.) (Agent: If yes, when and what did the pharmacy advise?)  Preferred Pharmacy (with phone number or street name):  [place], [place] [address]  [place] [address]  Phone: [phone] Fax: [phone]    Agent: Please be advised that RX refills may take up to 3 business days. We ask that you follow-up with your pharmacy.

## 2020-04-14 NOTE — Telephone Encounter (Signed)
lvm to inform pt of refill being called in and for pt to schedule an appt

## 2020-04-15 MED FILL — BD 3 ML SYR/NDLE 22G/1-1.5: 22G X 1-1/2 | 84 days supply | Qty: 6 | Fill #1

## 2020-04-15 MED FILL — TADALAFIL 20 MG TABS: 20 | 22 days supply | Qty: 18 | Fill #5

## 2020-04-15 MED FILL — TESTOSTERONE CYP 200 MG/ML: 200 | 55 days supply | Qty: 4 | Fill #1

## 2020-04-15 MED FILL — BD NEEDLES 18G 1: 18G X 1" | 84 days supply | Qty: 6 | Fill #1

## 2020-05-11 ENCOUNTER — Other Ambulatory Visit: Payer: Self-pay | Admitting: *Deleted

## 2020-05-11 DIAGNOSIS — E291 Testicular hypofunction: Secondary | ICD-10-CM

## 2020-05-11 NOTE — Progress Notes (Signed)
3

## 2020-05-17 ENCOUNTER — Inpatient Hospital Stay: Payer: 59 | Attending: Oncology

## 2020-05-17 DIAGNOSIS — C20 Malignant neoplasm of rectum: Secondary | ICD-10-CM | POA: Insufficient documentation

## 2020-05-17 DIAGNOSIS — Z452 Encounter for adjustment and management of vascular access device: Secondary | ICD-10-CM | POA: Diagnosis not present

## 2020-05-17 DIAGNOSIS — Z95828 Presence of other vascular implants and grafts: Secondary | ICD-10-CM

## 2020-05-17 MED ORDER — SODIUM CHLORIDE 0.9% FLUSH
10.0000 mL | Freq: Once | INTRAVENOUS | Status: AC
  Administered 2020-05-17: 10 mL via INTRAVENOUS
  Filled 2020-05-17: qty 10

## 2020-05-17 MED ORDER — HEPARIN SOD (PORK) LOCK FLUSH 100 UNIT/ML IV SOLN
500.0000 [IU] | Freq: Once | INTRAVENOUS | Status: AC
  Administered 2020-05-17: 500 [IU] via INTRAVENOUS
  Filled 2020-05-17: qty 5

## 2020-05-17 MED ORDER — HEPARIN SOD (PORK) LOCK FLUSH 100 UNIT/ML IV SOLN
INTRAVENOUS | Status: AC
  Filled 2020-05-17: qty 5

## 2020-05-31 ENCOUNTER — Other Ambulatory Visit: Payer: Self-pay

## 2020-06-02 ENCOUNTER — Ambulatory Visit: Payer: Self-pay | Admitting: Urology

## 2020-06-07 ENCOUNTER — Ambulatory Visit: Payer: Self-pay | Admitting: Urology

## 2020-06-08 ENCOUNTER — Other Ambulatory Visit: Payer: 59

## 2020-06-08 ENCOUNTER — Other Ambulatory Visit: Payer: Self-pay

## 2020-06-08 DIAGNOSIS — E291 Testicular hypofunction: Secondary | ICD-10-CM

## 2020-06-09 LAB — PSA: Prostate Specific Ag, Serum: 3.4 ng/mL (ref 0.0–4.0)

## 2020-06-09 LAB — TESTOSTERONE: Testosterone: 294 ng/dL (ref 264–916)

## 2020-06-09 LAB — HEMATOCRIT: Hematocrit: 46 % (ref 37.5–51.0)

## 2020-06-17 ENCOUNTER — Ambulatory Visit (INDEPENDENT_AMBULATORY_CARE_PROVIDER_SITE_OTHER): Payer: 59 | Admitting: Urology

## 2020-06-17 ENCOUNTER — Other Ambulatory Visit: Payer: Self-pay

## 2020-06-17 ENCOUNTER — Other Ambulatory Visit (HOSPITAL_COMMUNITY): Payer: Self-pay

## 2020-06-17 ENCOUNTER — Encounter: Payer: Self-pay | Admitting: Urology

## 2020-06-17 VITALS — BP 161/89 | HR 97 | Ht 75.0 in | Wt 275.0 lb

## 2020-06-17 DIAGNOSIS — E291 Testicular hypofunction: Secondary | ICD-10-CM

## 2020-06-17 DIAGNOSIS — N5201 Erectile dysfunction due to arterial insufficiency: Secondary | ICD-10-CM

## 2020-06-17 MED ORDER — TADALAFIL 20 MG PO TABS
ORAL_TABLET | ORAL | 3 refills | Status: DC
  Filled 2020-06-17: qty 6, 30d supply, fill #0
  Filled 2020-08-21: qty 6, 30d supply, fill #1
  Filled 2020-09-29: qty 6, 30d supply, fill #2

## 2020-06-17 NOTE — Progress Notes (Signed)
06/17/2020 3:20 PM   [name] [name] 08-08-1955 [id number]  Referring provider: [place] Hartshorn, [place],  [place] [address]  Chief Complaint  Patient presents with  . Hypogonadism    HPI: 65 y.o. male presents for follow-up of hypogonadism and ED.   Refer to my prior note 12/03/2019.  He elected to restart testosterone and did note significant improvement in his energy level and less lethargy while on replacement  He ran out of testosterone recently; testosterone level 06/08/2020 was 294; hematocrit 37.4 and PSA slightly above baseline at 3.4  Remains on tadalafil for ED  PMH: Past Medical History:  Diagnosis Date  . Anemia   . Cancer [facility])    rectal 2015, followed by Oncology, permanent colostomy  . Colostomy in place [facility])   . Dyspnea    on exertion  . History of kidney stones   . Pneumonia   . Seasonal allergies     Surgical History: Past Surgical History:  Procedure Laterality Date  . BACK SURGERY    . CHOLECYSTECTOMY    . COLON SURGERY    . ENDOBRONCHIAL ULTRASOUND N/A 07/01/2018   Procedure: ENDOBRONCHIAL ULTRASOUND;  Surgeon: Tyler Pita, MD;  Location: [facility];  Service: Cardiopulmonary;  Laterality: N/A;  . FLEXIBLE BRONCHOSCOPY Left 11/20/2018   Procedure: FLEXIBLE BRONCHOSCOPY;  Surgeon: Tyler Pita, MD;  Location: [facility];  Service: Cardiopulmonary;  Laterality: Left;  . INTERCOSTAL NERVE BLOCK Left 02/03/2019   Procedure: INTERCOSTAL NERVE BLOCK WITH EXPAREL;  Surgeon: Grace Isaac, MD;  Location: [place];  Service: Thoracic;  Laterality: Left;  . KNEE SURGERY Bilateral   . LOBECTOMY Left 02/03/2019   Procedure: LEFT UPPER LOBECTOMY WITH LYMPH NODE DISSECTION;  Surgeon: Grace Isaac, MD;  Location: [place];  Service: Thoracic;  Laterality: Left;  . PORT A CATH REVISION Left 08/05/2018   Procedure: PORT A CATH REVISION, REPOSITION LEFT;  Surgeon: Robert Bellow, MD;  Location: [facility];  Service:  General;  Laterality: Left;  . PORT-A-CATH REMOVAL Right 12/02/2014   Procedure: REMOVAL PORT-A-CATH;  Surgeon: Marlyce Huge, MD;  Location: [facility];  Service: General;  Laterality: Right;  . PORTACATH PLACEMENT    . PORTACATH PLACEMENT Left 07/29/2018   Procedure: INSERTION PORT-A-CATH LEFT;  Surgeon: Robert Bellow, MD;  Location: [facility];  Service: General;  Laterality: Left;  . THORACOTOMY/LOBECTOMY Left 02/03/2019   Procedure: MINITHORACOTOMY;  Surgeon: Grace Isaac, MD;  Location: [place];  Service: Thoracic;  Laterality: Left;  [name] VIDEO ASSISTED THORACOSCOPY (VATS)/WEDGE RESECTION Left 02/03/2019   Procedure: VIDEO ASSISTED THORACOSCOPY (VATS)/LUNG RESECTION;  Surgeon: Grace Isaac, MD;  Location: [place];  Service: Thoracic;  Laterality: Left;  [name] VIDEO BRONCHOSCOPY N/A 02/03/2019   Procedure: VIDEO BRONCHOSCOPY;  Surgeon: Grace Isaac, MD;  Location: [place];  Service: Thoracic;  Laterality: N/A;  . VIDEO BRONCHOSCOPY WITH ENDOBRONCHIAL ULTRASOUND N/A 12/30/2018   Procedure: VIDEO BRONCHOSCOPY WITH ENDOBRONCHIAL ULTRASOUND;  Surgeon: Grace Isaac, MD;  Location: [place];  Service: Thoracic;  Laterality: N/A;    Home Medications:  Allergies as of 06/17/2020      Reactions   Sulfa Antibiotics Hives   Morphine And Related Other (See Comments)   Altered mental status      Medication List       Accurate as of Jun 17, 2020  3:20 PM. If you have any questions, ask your nurse or doctor.  AFRIN EXTRA MOISTURIZING NA Place 1 spray into the nose 2 (two) times daily as needed (congestion.).   albuterol 108 (90 Base) MCG/ACT inhaler Commonly known as: VENTOLIN HFA INHALE 2 PUFFS BY MOUTH INTO THE LUNGS EVERY 6 HOURS AS NEEDED FOR WHEEZING OR SHORTNESS OF BREATH   amLODipine 10 MG tablet Commonly known as: NORVASC TAKE 1 TABLET BY MOUTH DAILY   aspirin-acetaminophen-caffeine 250-250-65 MG tablet Commonly known as: EXCEDRIN MIGRAINE Take 2 tablets by  mouth every 6 (six) hours as needed for headache.   aspirin-sod bicarb-citric acid 325 MG Tbef tablet Commonly known as: ALKA-SELTZER Take 650 mg by mouth every 6 (six) hours as needed (heartburn).   BD Hypodermic Needle 18G X 1" Misc Generic drug: NEEDLE (DISP) 18 G USE AS DIRECTED   cetirizine 10 MG tablet Commonly known as: ZYRTEC Take 10 mg by mouth at bedtime.   cyclobenzaprine 10 MG tablet Commonly known as: FLEXERIL TAKE 1 TABLET BY MOUTH 3 TIMES DAILY FOR MUSCLE SPASMS AS NEEDED. MAY MAKE DROWSY, DO NOT DRIVE AFTER TAKING   diclofenac Sodium 1 % Gel Commonly known as: VOLTAREN Apply 1 application topically 4 (four) times daily as needed (knee pain).   loperamide 2 MG capsule Commonly known as: IMODIUM Take 4 mg by mouth as needed for diarrhea or loose stools.   Luer Lock Safety Syringes 21G X 1-1/2" 3 ML Misc Generic drug: SYRINGE-NEEDLE (DISP) 3 ML Use as directed for testosterone administration   B-D 3CC LUER-LOK SYR 22GX1-1/2 22G X 1-1/2" 3 ML Misc Generic drug: SYRINGE-NEEDLE (DISP) 3 ML USE AS DIRECTED FOR TESTOSTERONE ADMINISTRATION   multivitamin with minerals Tabs tablet Take 1 tablet by mouth every evening.   tadalafil 20 MG tablet Commonly known as: CIALIS TAKE 1 TAB 1 HOUR PRIOR TO INTERCOURSE AS NEEDED   testosterone cypionate 200 MG/ML injection Commonly known as: DEPOTESTOSTERONE CYPIONATE INJECT 1 ML INTO THE MUSCLE EVERY 14 DAYS   timolol 0.5 % ophthalmic solution Commonly known as: BETIMOL Place 1 drop into the right eye at bedtime.   VISINE-A OP Place 1 drop into both eyes daily as needed (allergies).       Allergies:  Allergies  Allergen Reactions  . Sulfa Antibiotics Hives  . Morphine And Related Other (See Comments)    Altered mental status    Family History: Family History  Problem Relation Age of Onset  . Cancer Mother   . Hypertension Mother   . COPD Mother   . COPD Father   . Cancer Father        Prostate CA, on  Lupron  . Stroke Maternal Grandmother   . Cancer Paternal Grandmother   . Heart attack Paternal Grandfather   . Cancer Paternal Grandfather     Social History:  reports that he has never smoked. His smokeless tobacco use includes chew. He reports current alcohol use. He reports that he does not use drugs.   Physical Exam: BP (!) 161/89   Pulse 97   Ht _0  (1.905 m)   Wt 275 lb (124.7 kg)   BMI 34.37 kg/m   Constitutional:  Alert and oriented, No acute distress. HEENT: [place] AT, moist mucus membranes.  Trachea midline, no masses. Cardiovascular: No clubbing, cyanosis, or edema. Respiratory: Normal respiratory effort, no increased work of breathing.   Assessment & Plan:    1.  Hypogonadism  Desires to continue TRT and refill sent to pharmacy  Follow-up testosterone, PSA and hematocrit 6 months  1 year office visit  with labs  2.  Erectile dysfunction  Tadalafil was refilled   Abbie Sons, MD  [place] [address], [place], [place] [address] [phone]

## 2020-06-18 ENCOUNTER — Encounter: Payer: Self-pay | Admitting: Urology

## 2020-06-18 MED ORDER — TESTOSTERONE CYPIONATE 200 MG/ML IM SOLN
INTRAMUSCULAR | 0 refills | Status: DC
  Filled 2020-06-18: qty 6, 84d supply, fill #0
  Filled 2020-08-18: qty 4, 56d supply, fill #1
  Filled 2020-08-21: qty 6, 84d supply, fill #1
  Filled 2020-08-23: qty 4, 56d supply, fill #1
  Filled 2020-08-26: qty 6, 84d supply, fill #1
  Filled 2020-08-27: qty 4, 56d supply, fill #1

## 2020-06-19 ENCOUNTER — Other Ambulatory Visit (HOSPITAL_COMMUNITY): Payer: Self-pay

## 2020-06-22 ENCOUNTER — Other Ambulatory Visit (HOSPITAL_COMMUNITY): Payer: Self-pay

## 2020-06-22 ENCOUNTER — Encounter: Payer: Self-pay | Admitting: Oncology

## 2020-06-22 MED FILL — Syringe/Needle (Disp) 3 ML 22 x 1-1/2": 38 days supply | Qty: 38 | Fill #0 | Status: AC

## 2020-06-22 MED FILL — Needle (Disp) 18 x 1": 38 days supply | Qty: 38 | Fill #0 | Status: AC

## 2020-06-23 ENCOUNTER — Other Ambulatory Visit (HOSPITAL_COMMUNITY): Payer: Self-pay

## 2020-06-24 ENCOUNTER — Other Ambulatory Visit (HOSPITAL_COMMUNITY): Payer: Self-pay

## 2020-06-27 ENCOUNTER — Encounter: Payer: Self-pay | Admitting: Oncology

## 2020-07-08 ENCOUNTER — Other Ambulatory Visit: Payer: Self-pay | Admitting: *Deleted

## 2020-07-08 ENCOUNTER — Encounter: Payer: Self-pay | Admitting: Oncology

## 2020-07-08 ENCOUNTER — Other Ambulatory Visit (HOSPITAL_COMMUNITY): Payer: Self-pay

## 2020-07-08 MED ORDER — ALBUTEROL SULFATE HFA 108 (90 BASE) MCG/ACT IN AERS
2.0000 | INHALATION_SPRAY | Freq: Four times a day (QID) | RESPIRATORY_TRACT | 1 refills | Status: DC | PRN
  Filled 2020-07-08: qty 18, 25d supply, fill #1
  Filled 2020-07-08: qty 18, 25d supply, fill #0
  Filled 2020-08-18: qty 18, 25d supply, fill #1

## 2020-07-14 ENCOUNTER — Inpatient Hospital Stay: Payer: 59

## 2020-07-17 ENCOUNTER — Encounter: Payer: Self-pay | Admitting: Oncology

## 2020-07-20 ENCOUNTER — Inpatient Hospital Stay: Payer: 59

## 2020-08-01 IMAGING — DX DG ABDOMEN ACUTE W/ 1V CHEST
4 series · 4 of 4 positions shown · non-contrast
Comparison: 02/06/2019.

CLINICAL DATA: Recent lung surgery.  Chest tube.

EXAM:
DG ABDOMEN ACUTE W/ 1V CHEST

[abdomen erect (1 of 2)]
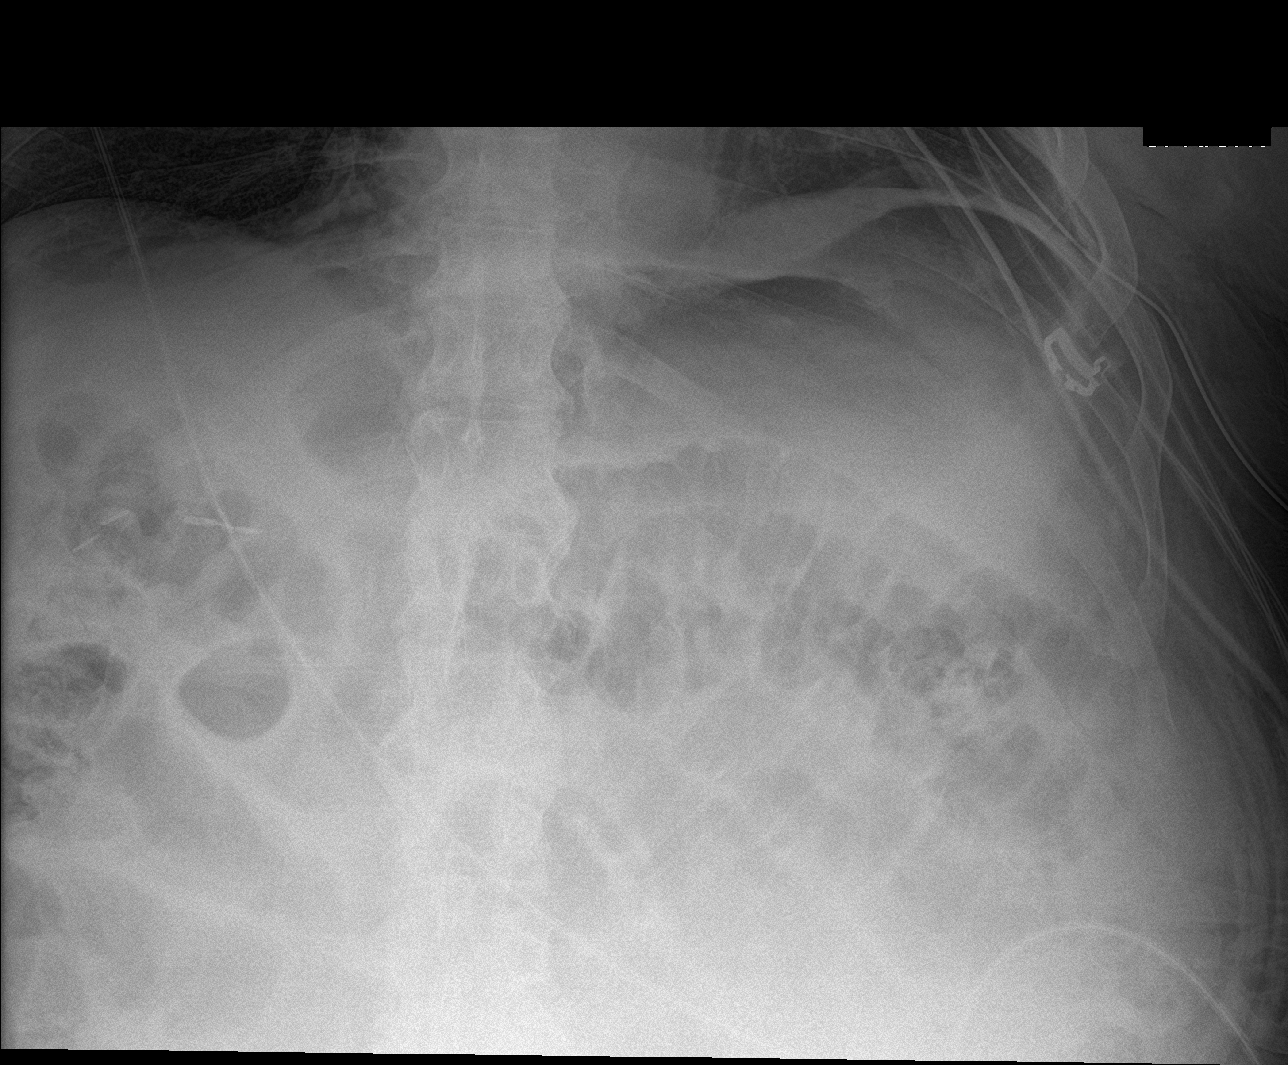

[abdomen supine]
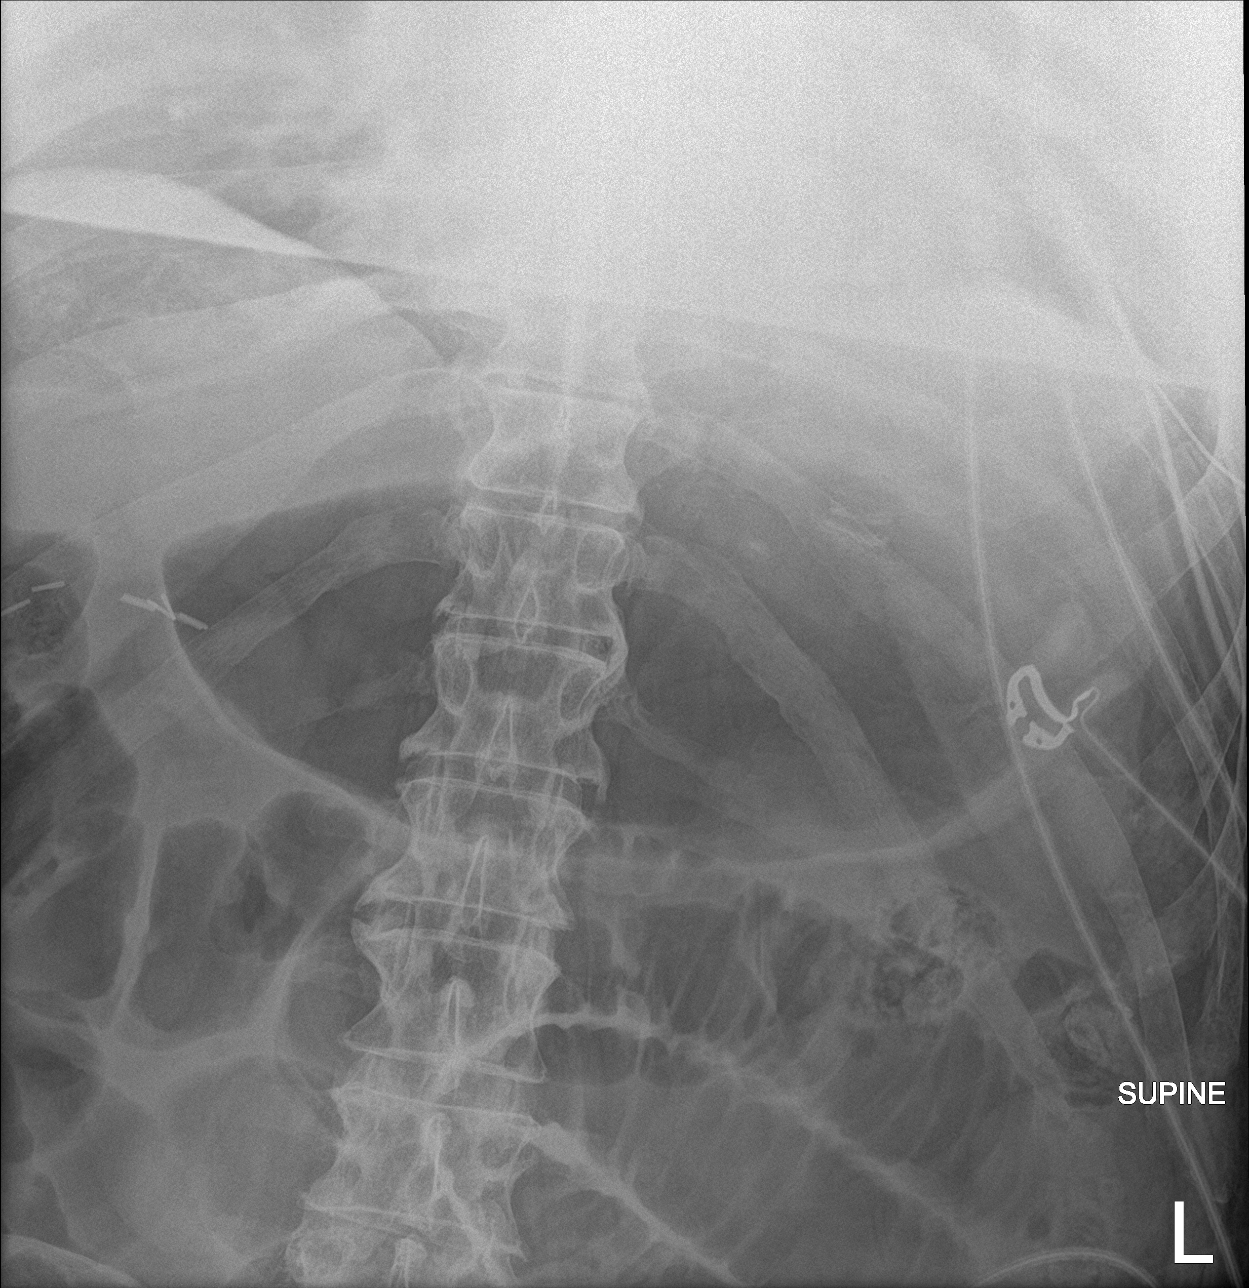

[chest ap]
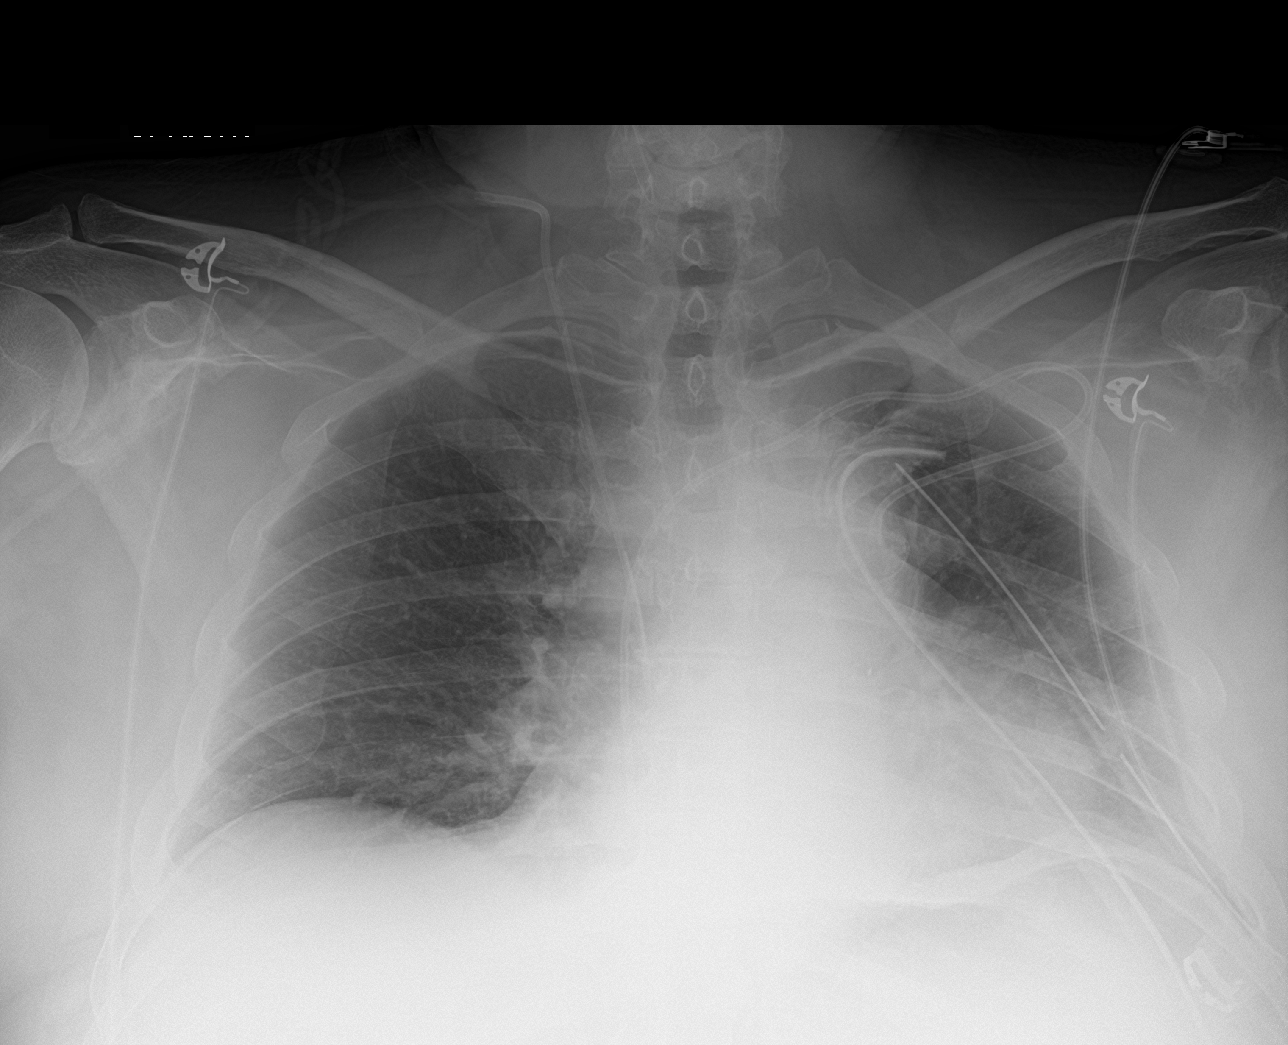

[abdomen erect (2 of 2)]
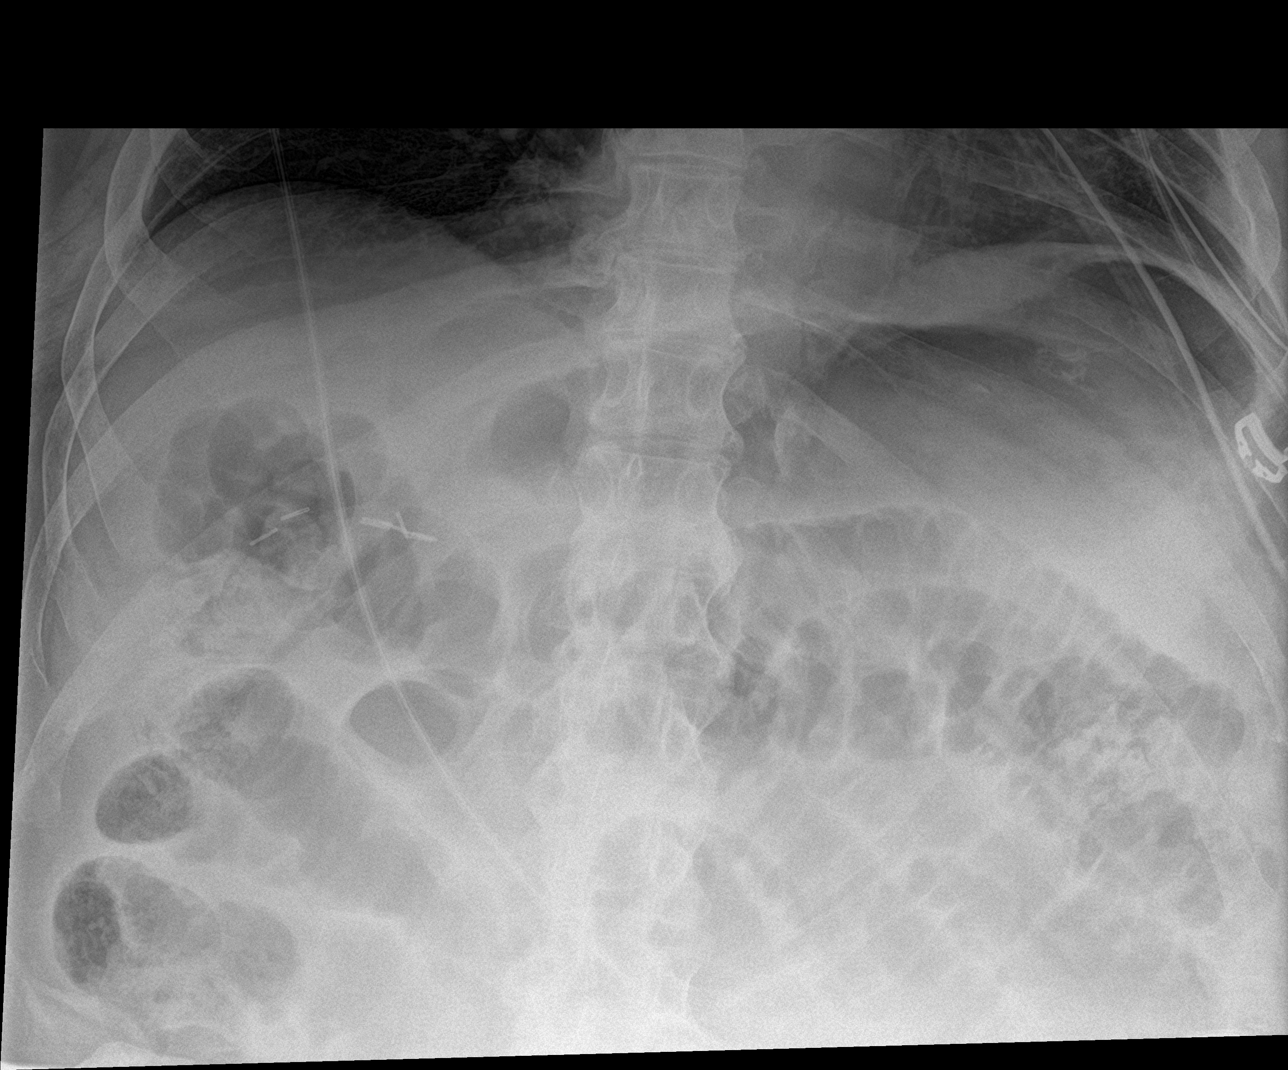

[4 of 4 positions shown; findings below may reference images not displayed]

FINDINGS: Two left chest tubes in stable position. Left PowerPort catheter and
right IJ line stable position. No pneumothorax. Low lung volumes
with bibasilar atelectasis. Stable cardiomegaly.

Lower abdomen is not imaged. Surgical clips right upper quadrant.
Gastric distention noted. Multiple distended loops of small bowel
are noted. Stool is present in the colon. No free air. Diffuse
thoracolumbar spine degenerative change.
IMPRESSION: 1. Lines and tubes including 2 left chest tubes are in stable
position. No pneumothorax. Bibasilar atelectasis.

2. Limited abdominal valuation, the lower abdomen was not imaged.
Gastric distention. Multiple distended loops of small bowel.
Small-bowel obstruction cannot be excluded.

## 2020-08-03 IMAGING — DX DG ABD PORTABLE 1V
1 series · 1 of 1 positions shown · non-contrast
Comparison: Chest x-ray 02/07/2019.

CLINICAL DATA: 63-year-old male with history of ileus.

EXAM:
PORTABLE ABDOMEN - 1 VIEW

[abdomen kub]
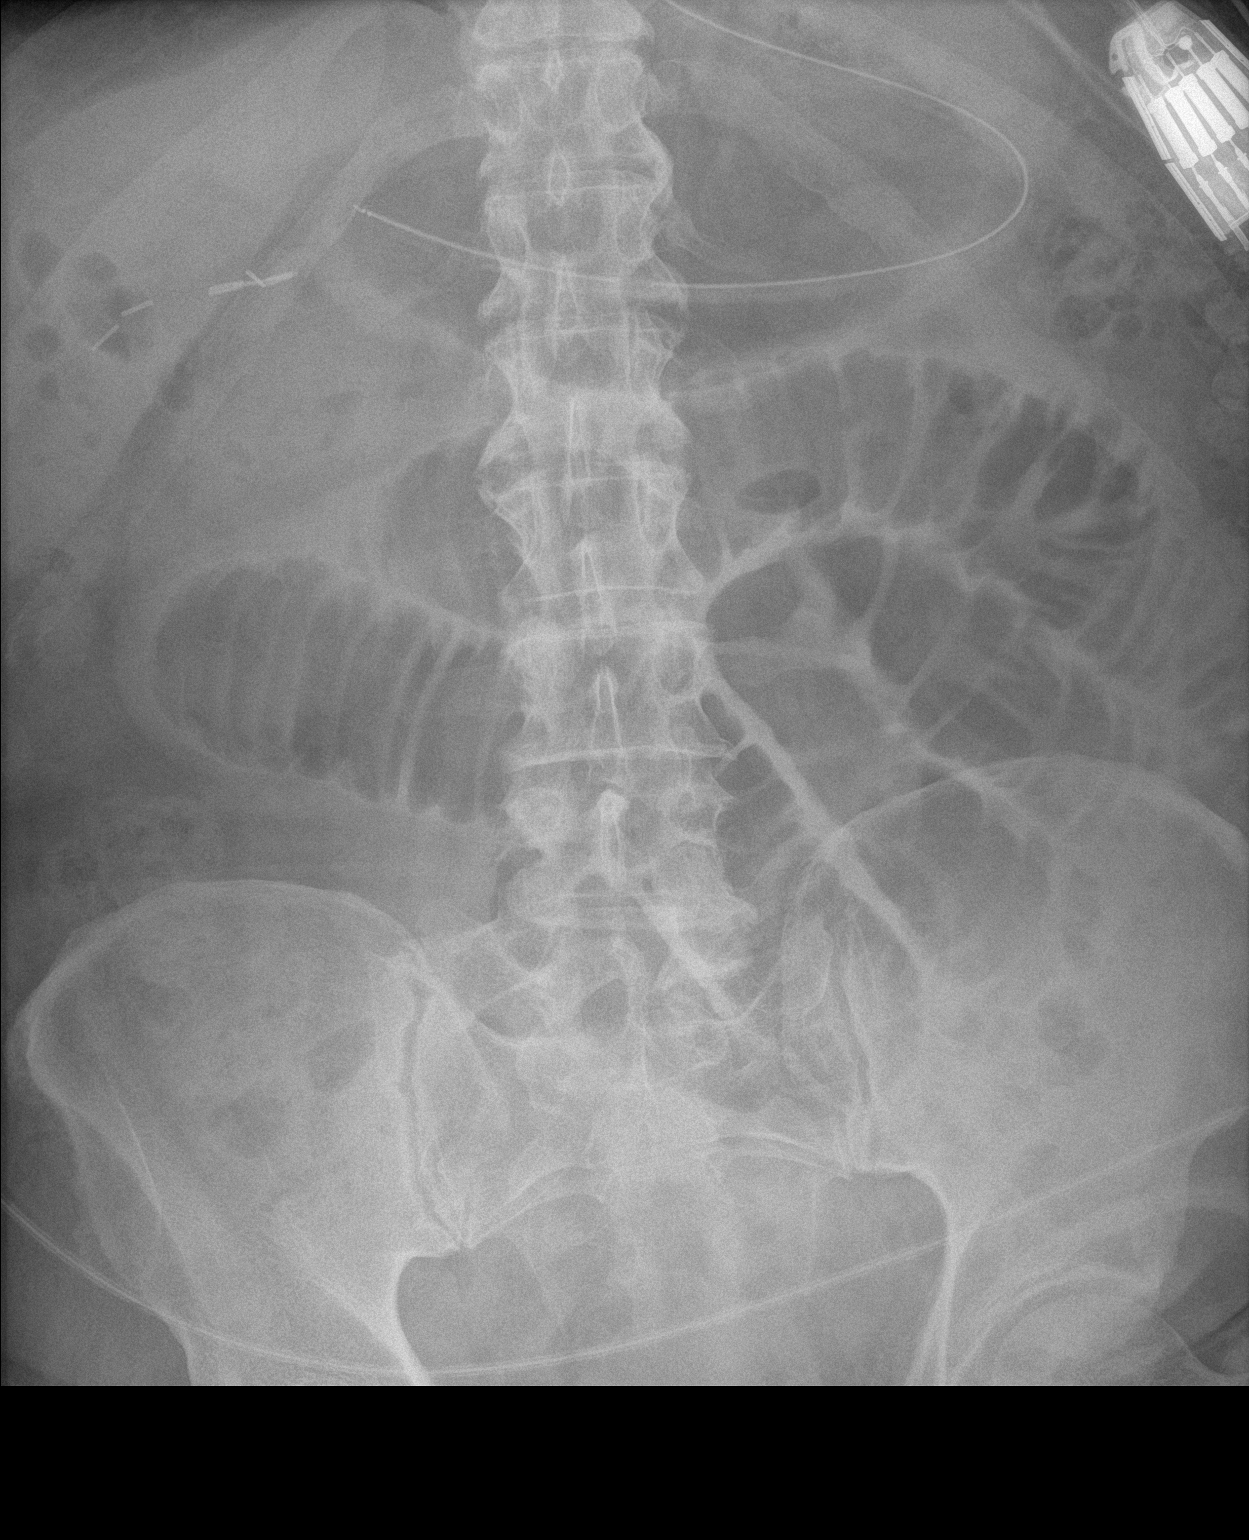

[1 of 1 positions shown; findings below may reference images not displayed]

FINDINGS: Nasogastric tube tip in the antral pre-pyloric region of the
stomach. Diffuse dilatation of the small bowel measuring up to
cm in diameter. Patchy gas and stool noted throughout the colon,
which appears decompressed. No rectal gas. No gross evidence of
pneumoperitoneum noted on this single supine view. Surgical clips
project over the right upper quadrant of the abdomen, likely from
prior cholecystectomy.
IMPRESSION: 1. Persistent small bowel dilatation with decompressed colon,
concerning for partial small bowel obstruction.
2. Support apparatus and postoperative changes, as above.

## 2020-08-03 IMAGING — DX DG CHEST 1V PORT
1 series · 1 of 1 positions shown · non-contrast
Comparison: Radiograph 02/07/2019

CLINICAL DATA: Post resection of LEFT upper lobe mass.

EXAM:
PORTABLE CHEST 1 VIEW

[chest ap]
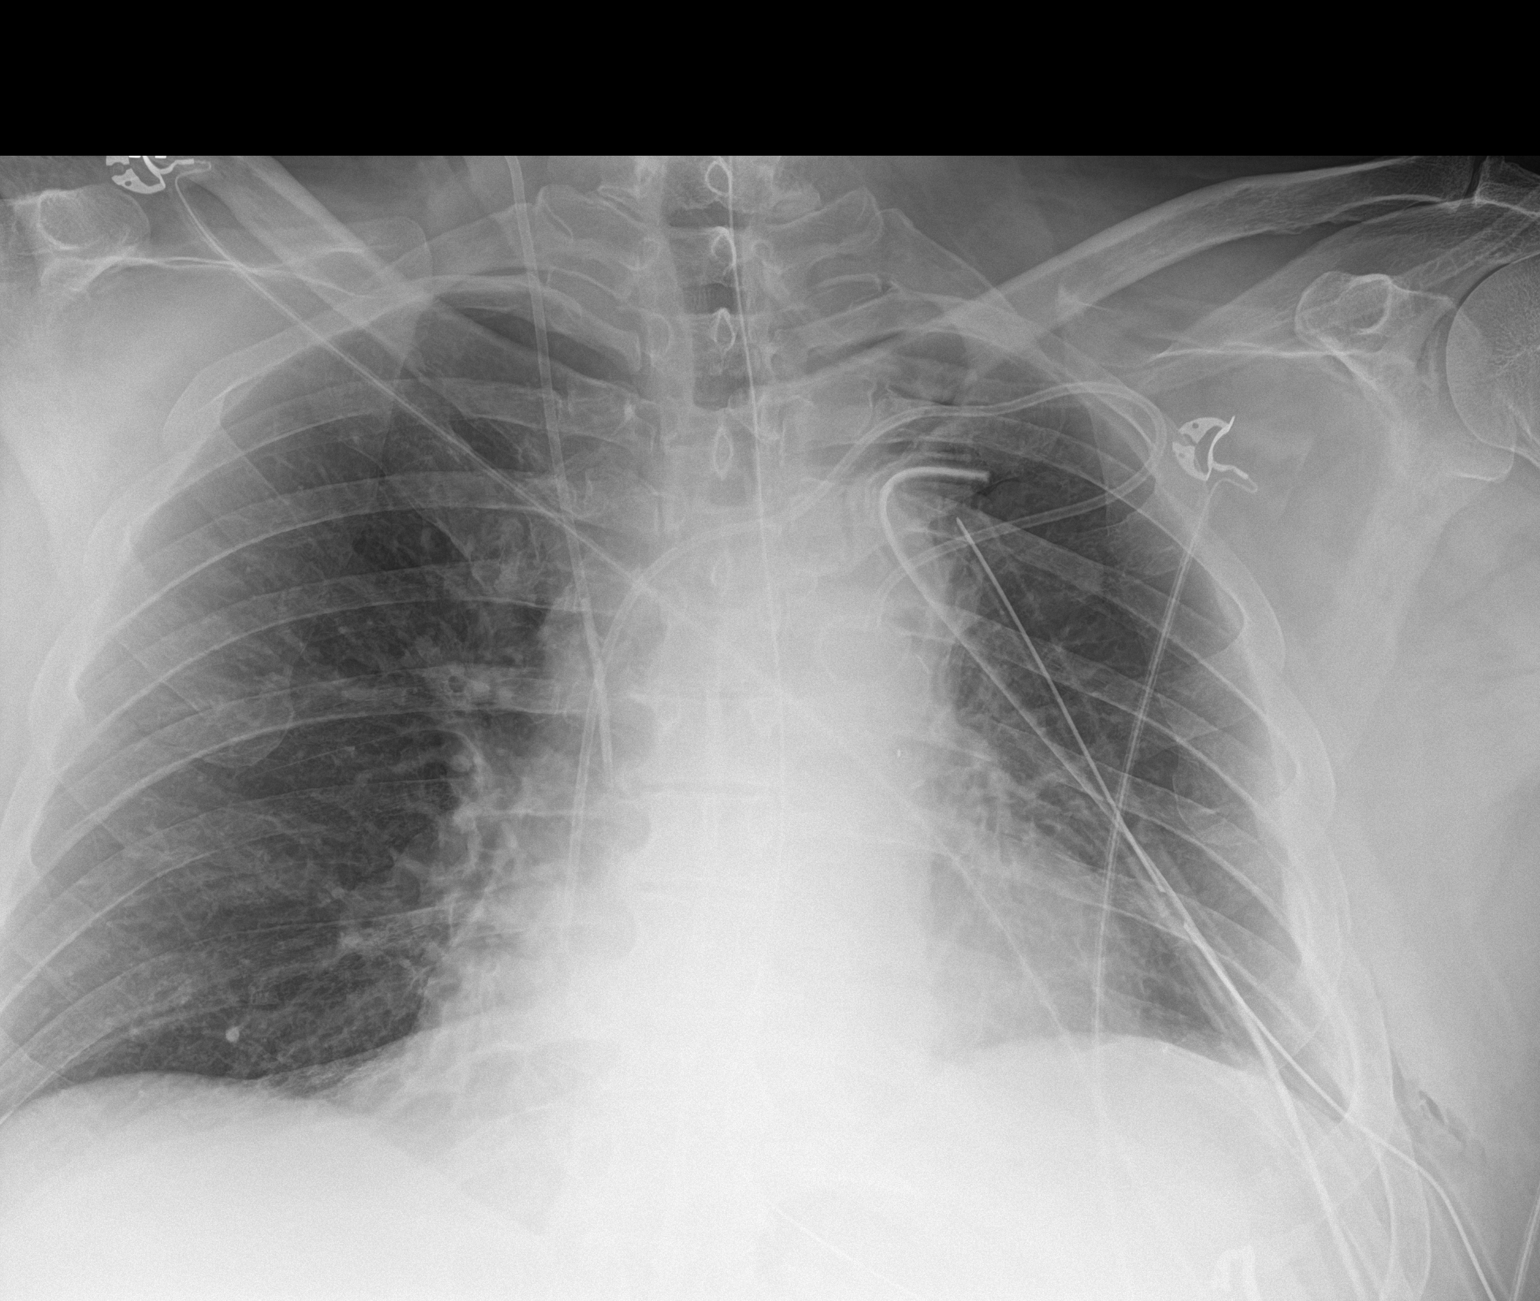

[1 of 1 positions shown; findings below may reference images not displayed]

FINDINGS: Port in the anterior chest wall with tip in distal SVC. NG tube
extends the stomach. Two LEFT chest tubes in place without
appreciable pneumothorax. RIGHT lung clear. Central venous line
unchanged.
IMPRESSION: 1. Two LEFT chest tubes in place.  No appreciable pneumothorax.
2. NG tube extend into the stomach.

## 2020-08-07 IMAGING — DX DG CHEST 1V PORT
1 series · 1 of 1 positions shown · non-contrast
Comparison: Chest radiograph from one day prior.

CLINICAL DATA: Left VATS with lung resection, chest tube

EXAM:
PORTABLE CHEST 1 VIEW

[chest ap]
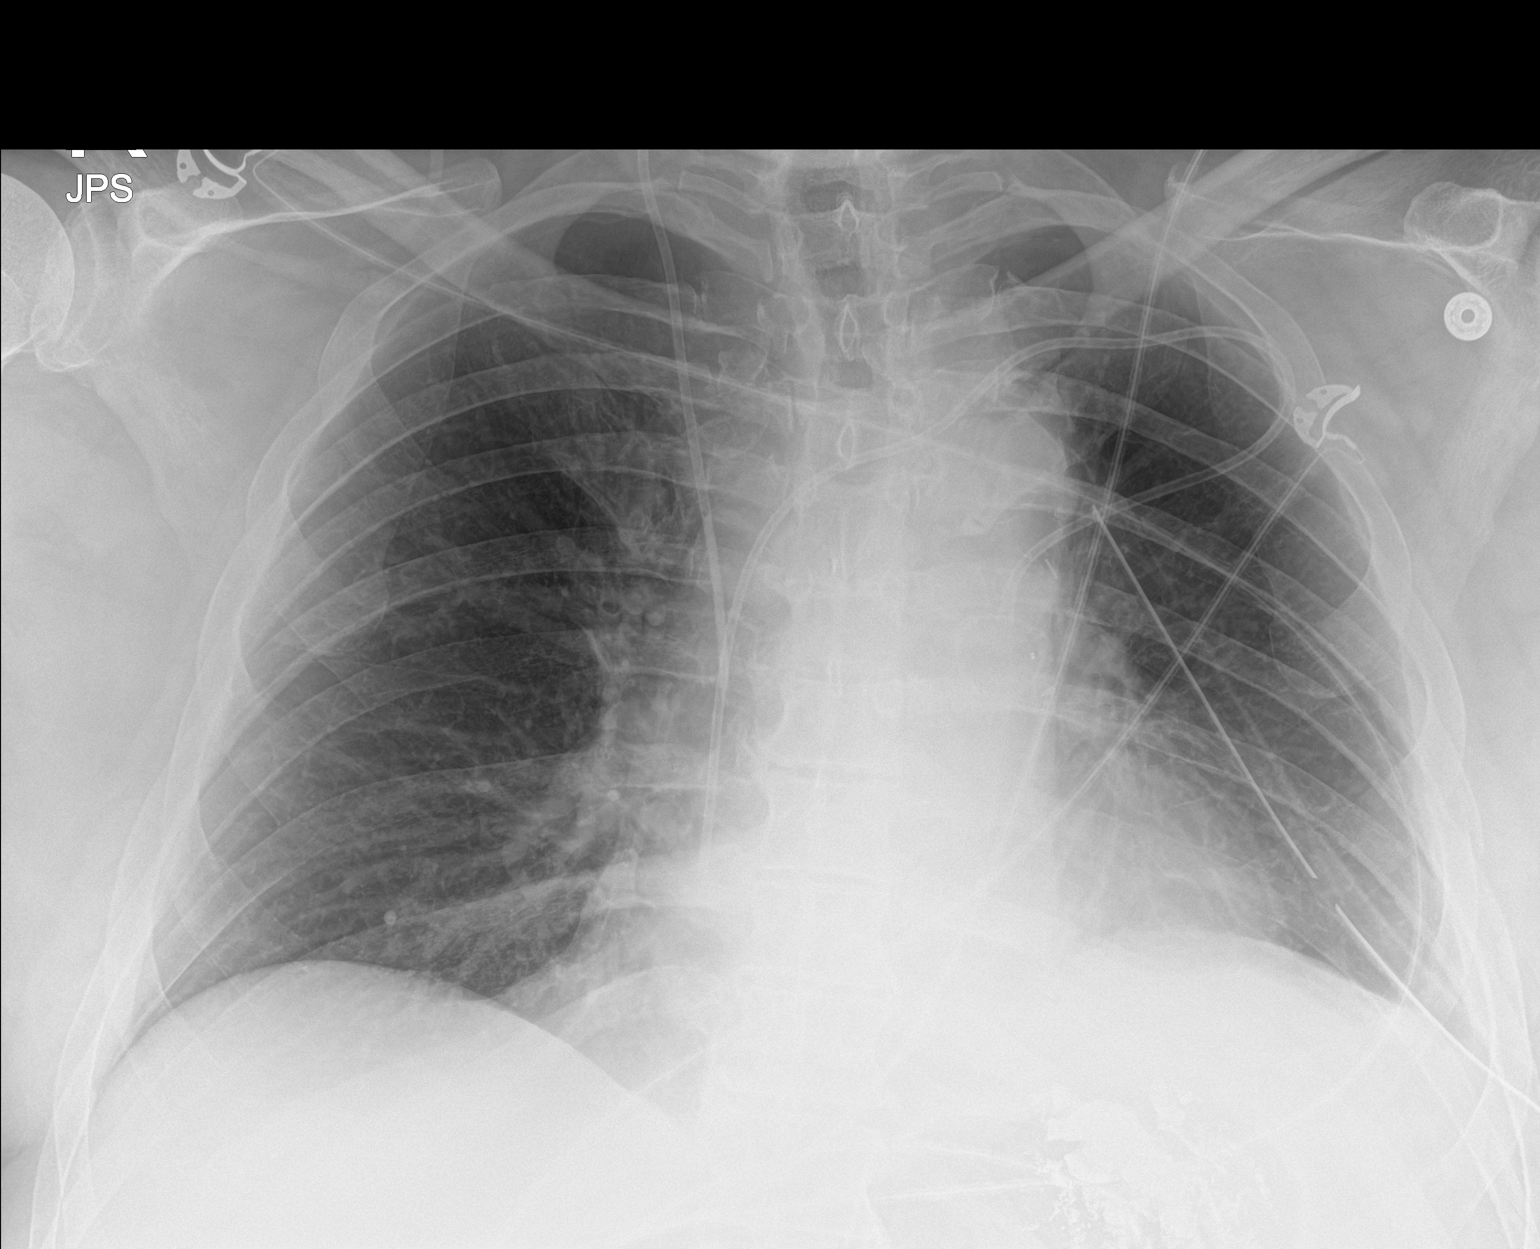

[1 of 1 positions shown; findings below may reference images not displayed]

FINDINGS: Left subclavian Port-A-Cath terminates at the cavoatrial junction.
Right internal jugular central venous catheter terminates in lower
third of the SVC. One of the left-sided chest tube has been removed.
The other left-sided chest tube is stable in position with tip in
the upper left pleural space. Stable cardiomediastinal silhouette
with top-normal heart size. Probable tiny left apical pneumothorax,
less than 5%, not definitely seen on prior. No right pneumothorax.
No pleural effusion. No pulmonary edema. Stable volume loss in the
left hemithorax.
IMPRESSION: Probable tiny left apical pneumothorax status post removal of 1 of
the 2 left chest tubes, not definitely seen on yesterday's
radiograph.

## 2020-08-09 ENCOUNTER — Other Ambulatory Visit (HOSPITAL_COMMUNITY): Payer: Self-pay

## 2020-08-09 ENCOUNTER — Other Ambulatory Visit: Payer: Self-pay | Admitting: Family Medicine

## 2020-08-09 DIAGNOSIS — I10 Essential (primary) hypertension: Secondary | ICD-10-CM

## 2020-08-09 MED ORDER — AMLODIPINE BESYLATE 10 MG PO TABS
ORAL_TABLET | Freq: Every day | ORAL | 0 refills | Status: DC
  Filled 2020-08-09: qty 90, 90d supply, fill #0

## 2020-08-09 NOTE — Telephone Encounter (Signed)
Requested medication (s) are due for refill today: yes  Requested medication (s) are on the active medication list: yes  Last refill:  04/14/20 #90 0 refills  Future visit scheduled: no  Notes to clinic:  no valid encounter within 6 months. Called patient to schedule appt. No answer, LVMTCB. Do you want courtesy refill ?     Requested Prescriptions  Pending Prescriptions Disp Refills   amLODipine (NORVASC) 10 MG tablet 90 tablet 0    Sig: TAKE 1 TABLET BY MOUTH DAILY      Cardiovascular:  Calcium Channel Blockers Failed - 08/09/2020  1:19 PM      Failed - Last BP in normal range    BP Readings from Last 1 Encounters:  06/17/20 (!) 161/89          Failed - Valid encounter within last 6 months    Recent Outpatient Visits           9 months ago Spasm of muscle of lower back   [facility] [place], [place]   2 years ago Annual physical exam   [place], [place]   2 years ago Low testosterone in male   [facility] [place], [place]   3 years ago Needs flu shot   [place], [place]   3 years ago Mixed hyperlipidemia   [place], [place], [place]       Future Appointments             In 1 month [place], [place]   In 10 months [place], [place], [place]

## 2020-08-09 NOTE — Telephone Encounter (Signed)
Has appointment 08/11/20.

## 2020-08-09 NOTE — Telephone Encounter (Signed)
Medication Refill - Medication: amLODipine (NORVASC) 10 MG tablet   Has the patient contacted their pharmacy? No.  Preferred Pharmacy (with phone number or street name):  [facility]  Phone:  [phone] Fax:  [phone]   Agent: Please be advised that RX refills may take up to 3 business days. We ask that you follow-up with your pharmacy.

## 2020-08-09 NOTE — Telephone Encounter (Signed)
Pt does have an appt on the 07/20, wanted to see about getting a small refill until then, pt states he has ran out of medication. Please advise.

## 2020-08-10 ENCOUNTER — Other Ambulatory Visit (HOSPITAL_COMMUNITY): Payer: Self-pay

## 2020-08-10 NOTE — Progress Notes (Signed)
Name: [name] [name]   MRN: [id number]    DOB: [birth year]   Date:08/11/2020       Progress Note  Subjective  Chief Complaint  Medication Refill  HPI  HTN: he does not check his bp at home, reviewed bp from the last 3 office visits at other physician's offices and bp either high or borderline. Discussed adding ACE or ARB to his regiment to keep bp around 130/80 but he declined. Advised him to monitor at home and let me know if lower when at home and he agreed. He denies chest pain or palpitation.   Vitals with BMI 08/11/2020 06/17/2020 02/05/2020  Height _0  _1  _2   Weight 284 lbs 275 lbs 297 lbs  BMI 35.5 37.62 83.15  Systolic 176 160 737  Diastolic 86 89 79  Pulse 84 97 113  Some encounter information is confidential and restricted. Go to Review Flowsheets activity to see all data.    Rectal cancer : diagnosed in 2015 and had permanent colostomy. He is a patient of Dr. Grayland Ormond, he did well for 5 years and during surveillance his CEA started to climb and he was found to have a mass on Left upper quadrant that was biopsied and showed metastatic rectal cancer to the lung. He had a lobectomy and has been doing well , he has some SOB with activity but able to do all ADL and states not limited by anything.   Insomnia: takes Ambien prn given by Dr. Grayland Ormond  Intermittent low back pain: he has a history of back surgery and has intermittent muscle spasms , he takes flexeril prn, he needs a refill, currently pain free, but gets up slowly from sitting due to knee pain  Morbid obesity: his BMI is above 35 with co-morbidities such as HTN, OA knee, dyslipidemia, discussed healthier diet and smaller portions  Dyslipidemia: we will recheck labs, currently not taking statin therapy.    Patient Active Problem List   Diagnosis Date Noted   Metastatic cancer to lung ([place]) 08/11/2020   Essential hypertension 08/11/2020   Morbid obesity ([place]) 08/11/2020   Dyslipidemia 08/11/2020   Spasm of  muscle of lower back 08/11/2020   Peyronie's disease 10/15/2019   Tobacco use 10/15/2019   Glaucoma of right eye 03/22/2018   Erectile dysfunction 03/22/2018   Class 2 severe obesity due to excess calories with serious comorbidity and body mass index (BMI) of 35.0 to 35.9 in adult [facility]) 03/22/2018   Decreased vision of right eye 09/21/2017   Colostomy in place [facility]) 09/21/2017   Hypogonadism in male 09/21/2017   Seasonal allergies    Degeneration of lumbar intervertebral disc 06/26/2017   Rectal cancer ([place]) 08/05/2014    Past Surgical History:  Procedure Laterality Date   BACK SURGERY     CHOLECYSTECTOMY     COLON SURGERY     ENDOBRONCHIAL ULTRASOUND N/A 07/01/2018   Procedure: ENDOBRONCHIAL ULTRASOUND;  Surgeon: Tyler Pita, MD;  Location: [facility];  Service: Cardiopulmonary;  Laterality: N/A;   FLEXIBLE BRONCHOSCOPY Left 11/20/2018   Procedure: FLEXIBLE BRONCHOSCOPY;  Surgeon: Tyler Pita, MD;  Location: [facility];  Service: Cardiopulmonary;  Laterality: Left;   INTERCOSTAL NERVE BLOCK Left 02/03/2019   Procedure: INTERCOSTAL NERVE BLOCK WITH EXPAREL;  Surgeon: Grace Isaac, MD;  Location: [place];  Service: Thoracic;  Laterality: Left;   KNEE SURGERY Bilateral    LOBECTOMY Left 02/03/2019   Procedure: LEFT UPPER LOBECTOMY WITH LYMPH NODE DISSECTION;  Surgeon: Lanelle Bal  B, MD;  Location: [place];  Service: Thoracic;  Laterality: Left;   PORT A CATH REVISION Left 08/05/2018   Procedure: PORT A CATH REVISION, REPOSITION LEFT;  Surgeon: Robert Bellow, MD;  Location: [facility];  Service: General;  Laterality: Left;   PORT-A-CATH REMOVAL Right 12/02/2014   Procedure: REMOVAL PORT-A-CATH;  Surgeon: Marlyce Huge, MD;  Location: [facility];  Service: General;  Laterality: Right;   PORTACATH PLACEMENT     PORTACATH PLACEMENT Left 07/29/2018   Procedure: INSERTION PORT-A-CATH LEFT;  Surgeon: Robert Bellow, MD;  Location: [facility];  Service: General;   Laterality: Left;   THORACOTOMY/LOBECTOMY Left 02/03/2019   Procedure: MINITHORACOTOMY;  Surgeon: Grace Isaac, MD;  Location: [place];  Service: Thoracic;  Laterality: Left;   VIDEO ASSISTED THORACOSCOPY (VATS)/WEDGE RESECTION Left 02/03/2019   Procedure: VIDEO ASSISTED THORACOSCOPY (VATS)/LUNG RESECTION;  Surgeon: Grace Isaac, MD;  Location: [place];  Service: Thoracic;  Laterality: Left;   VIDEO BRONCHOSCOPY N/A 02/03/2019   Procedure: VIDEO BRONCHOSCOPY;  Surgeon: Grace Isaac, MD;  Location: [place];  Service: Thoracic;  Laterality: N/A;   VIDEO BRONCHOSCOPY WITH ENDOBRONCHIAL ULTRASOUND N/A 12/30/2018   Procedure: VIDEO BRONCHOSCOPY WITH ENDOBRONCHIAL ULTRASOUND;  Surgeon: Grace Isaac, MD;  Location: [place];  Service: Thoracic;  Laterality: N/A;    Family History  Problem Relation Age of Onset   Cancer Mother    Hypertension Mother    COPD Mother    COPD Father    Cancer Father        Prostate CA, on Lupron   Stroke Maternal Grandmother    Cancer Paternal Grandmother    Heart attack Paternal Grandfather    Cancer Paternal Grandfather     Social History   Tobacco Use   Smoking status: Never   Smokeless tobacco: Current    Types: Chew   Tobacco comments:    might smoke a cigar once every 3 months  Substance Use Topics   Alcohol use: Yes    Alcohol/week: 0.0 standard drinks    Comment: RARE     Current Outpatient Medications:    albuterol (VENTOLIN HFA) 108 (90 Base) MCG/ACT inhaler, INHALE 2 PUFFS BY MOUTH INTO THE LUNGS EVERY 6 HOURS AS NEEDED FOR WHEEZING OR SHORTNESS OF BREATH, Disp: 18 g, Rfl: 1   aspirin-acetaminophen-caffeine (EXCEDRIN MIGRAINE) 250-250-65 MG tablet, Take 2 tablets by mouth every 6 (six) hours as needed for headache., Disp: , Rfl:    aspirin-sod bicarb-citric acid (ALKA-SELTZER) 325 MG TBEF tablet, Take 650 mg by mouth every 6 (six) hours as needed (heartburn)., Disp: , Rfl:    cetirizine (ZYRTEC) 10 MG tablet, Take 10 mg by mouth  at bedtime. , Disp: , Rfl:    loperamide (IMODIUM) 2 MG capsule, Take 4 mg by mouth as needed for diarrhea or loose stools. , Disp: , Rfl:    Multiple Vitamin (MULTIVITAMIN WITH MINERALS) TABS tablet, Take 1 tablet by mouth every evening. , Disp: , Rfl:    Naphazoline-Pheniramine (VISINE-A OP), Place 1 drop into both eyes daily as needed (allergies)., Disp: , Rfl:    NEEDLE, DISP, 18 G 18G X 1" MISC, USE AS DIRECTED, Disp: 50 each, Rfl: 0   SYRINGE-NEEDLE, DISP, 3 ML (LUER LOCK SAFETY SYRINGES) 21G X 1-1/2" 3 ML MISC, Use as directed for testosterone administration, Disp: 50 each, Rfl: 0   tadalafil (CIALIS) 20 MG tablet, TAKE 1 TAB BY MOUTH 1 HOUR PRIOR TO INTERCOURSE AS NEEDED, Disp: 30 tablet, Rfl:  3   testosterone cypionate (DEPOTESTOSTERONE CYPIONATE) 200 MG/ML injection, INJECT 1 ML INTO THE MUSCLE EVERY 14 DAYS, Disp: 10 mL, Rfl: 0   amLODipine (NORVASC) 10 MG tablet, TAKE 1 TABLET BY MOUTH DAILY, Disp: 90 tablet, Rfl: 0   cyclobenzaprine (FLEXERIL) 10 MG tablet, Take 1 tablet (10 mg total) by mouth 3 (three) times daily as needed for muscle spasms., Disp: 90 tablet, Rfl: 0  Allergies  Allergen Reactions   Sulfa Antibiotics Hives   Morphine And Related Other (See Comments)    Altered mental status    I personally reviewed active problem list, medication list, allergies, family history, social history, health maintenance with the patient/caregiver today.   ROS  Constitutional: Negative for fever or weight change.  Respiratory: Negative for cough and shortness of breath.   Cardiovascular: Negative for chest pain or palpitations.  Gastrointestinal: Negative for abdominal pain, no bowel changes.  Musculoskeletal: Negative for gait problem or joint swelling.  Skin: Negative for rash.  Neurological: Negative for dizziness or headache.  No other specific complaints in a complete review of systems (except as listed in HPI above).   Objective  Vitals:   08/11/20 1504  BP: 140/86   Pulse: 84  Resp: 16  Temp: 98.3 F (36.8 C)  TempSrc: Oral  SpO2: 98%  Weight: 284 lb (128.8 kg)  Height: _0  (1.905 m)    Body mass index is 35.5 kg/m.  Physical Exam  Constitutional: Patient appears well-developed and well-nourished. Obese  No distress.  HEENT: head atraumatic, normocephalic, pupils equal and reactive to light, neck supple Cardiovascular: Normal rate, regular rhythm and normal heart sounds.  No murmur heard. No BLE edema. Pulmonary/Chest: Effort normal and breath sounds normal. No respiratory distress. Abdominal: Soft.  There is no tenderness.colostomy bag in place  Psychiatric: Patient has a normal mood and affect. behavior is normal. Judgment and thought content normal.  Muscular Skeletal: scar from old surgery, no pain during palpation of lumbar spine   Recent Results (from the past 2160 hour(s))  PSA     Status: None   Collection Time: 06/08/20 10:57 AM  Result Value Ref Range   Prostate Specific Ag, Serum 3.4 0.0 - 4.0 ng/mL    Comment: Roche ECLIA methodology. According to the American Urological Association, Serum PSA should decrease and remain at undetectable levels after radical prostatectomy. The AUA defines biochemical recurrence as an initial PSA value 0.2 ng/mL or greater followed by a subsequent confirmatory PSA value 0.2 ng/mL or greater. Values obtained with different assay methods or kits cannot be used interchangeably. Results cannot be interpreted as absolute evidence of the presence or absence of malignant disease.   Testosterone     Status: None   Collection Time: 06/08/20 10:57 AM  Result Value Ref Range   Testosterone 294 264 - 916 ng/dL    Comment: Adult male reference interval is based on a population of healthy nonobese males (BMI <30) between 72 and 89 years old. [place], [place] [phone]. PMID: [id number].   Hematocrit     Status: None   Collection Time: 06/08/20 10:57 AM  Result Value Ref Range    Hematocrit 46.0 37.5 - 51.0 %      PHQ2/9: Depression screen [facility] 2/9 08/11/2020 10/15/2019 01/15/2019 12/25/2018 03/22/2018  Decreased Interest 0 0 0 0 0  Down, Depressed, Hopeless 0 0 0 0 0  PHQ - 2 Score 0 0 0 0 0  Altered sleeping - - - - 0  Tired, decreased energy - - - -  0  Change in appetite - - - - 0  Feeling bad or failure about yourself  - - - - 0  Trouble concentrating - - - - 0  Moving slowly or fidgety/restless - - - - 0  Suicidal thoughts - - - - 0  PHQ-9 Score - - - - 0  Difficult doing work/chores - - - - Not difficult at all  Some recent data might be hidden    phq 9 is negative   Fall Risk: Fall Risk  08/11/2020 10/15/2019 01/15/2019 12/25/2018 12/02/2018  Falls in the past year? 0 0 0 0 0  Number falls in past yr: 0 0 - - -  Injury with Fall? 0 0 - - -  Follow up - Falls evaluation completed Falls evaluation completed Falls evaluation completed -      Functional Status Survey: Is the patient deaf or have difficulty hearing?: No Does the patient have difficulty seeing, even when wearing glasses/contacts?: Yes Does the patient have difficulty concentrating, remembering, or making decisions?: No Does the patient have difficulty walking or climbing stairs?: No Does the patient have difficulty dressing or bathing?: No Does the patient have difficulty doing errands alone such as visiting a doctor's office or shopping?: No   Assessment & Plan  1. Essential hypertension  - amLODipine (NORVASC) 10 MG tablet; TAKE 1 TABLET BY MOUTH DAILY  Dispense: 90 tablet; Refill: 0 - Comprehensive metabolic panel; Future  2. Spasm of muscle of lower back  - cyclobenzaprine (FLEXERIL) 10 MG tablet; Take 1 tablet (10 mg total) by mouth 3 (three) times daily as needed for muscle spasms.  Dispense: 90 tablet; Refill: 0  3. Colostomy in place [facility])  Permanent   4. Class 2 severe obesity due to excess calories with serious comorbidity and body mass index (BMI) of 35.0 to 35.9  in adult ([place])   5. Malignant neoplasm metastatic to left lung [facility])  S/p lobectomy   6. Rectal cancer ([place])  Under the care of Dr. Grayland Ormond   7. Hypogonadism in male  Under the care of Dr. Yves Dill   8. Dyslipidemia  - Lipid panel; Future    9. Morbid obesity ([place])  Discussed with the patient the risk posed by an increased BMI. Discussed importance of portion control, calorie counting and at least 150 minutes of physical activity weekly. Avoid sweet beverages and drink more water. Eat at least 6 servings of fruit and vegetables daily

## 2020-08-11 ENCOUNTER — Other Ambulatory Visit: Payer: Self-pay

## 2020-08-11 ENCOUNTER — Ambulatory Visit: Payer: 59 | Admitting: Family Medicine

## 2020-08-11 ENCOUNTER — Other Ambulatory Visit (HOSPITAL_COMMUNITY): Payer: Self-pay

## 2020-08-11 ENCOUNTER — Encounter: Payer: Self-pay | Admitting: Family Medicine

## 2020-08-11 VITALS — BP 140/86 | HR 84 | Temp 98.3°F | Resp 16 | Ht 75.0 in | Wt 284.0 lb

## 2020-08-11 DIAGNOSIS — Z6835 Body mass index (BMI) 35.0-35.9, adult: Secondary | ICD-10-CM

## 2020-08-11 DIAGNOSIS — E291 Testicular hypofunction: Secondary | ICD-10-CM | POA: Diagnosis not present

## 2020-08-11 DIAGNOSIS — I10 Essential (primary) hypertension: Secondary | ICD-10-CM

## 2020-08-11 DIAGNOSIS — Z933 Colostomy status: Secondary | ICD-10-CM | POA: Diagnosis not present

## 2020-08-11 DIAGNOSIS — M62830 Muscle spasm of back: Secondary | ICD-10-CM | POA: Diagnosis not present

## 2020-08-11 DIAGNOSIS — E785 Hyperlipidemia, unspecified: Secondary | ICD-10-CM | POA: Diagnosis not present

## 2020-08-11 DIAGNOSIS — M5136 Other intervertebral disc degeneration, lumbar region: Secondary | ICD-10-CM

## 2020-08-11 DIAGNOSIS — C20 Malignant neoplasm of rectum: Secondary | ICD-10-CM

## 2020-08-11 DIAGNOSIS — C7800 Secondary malignant neoplasm of unspecified lung: Secondary | ICD-10-CM | POA: Insufficient documentation

## 2020-08-11 DIAGNOSIS — C7802 Secondary malignant neoplasm of left lung: Secondary | ICD-10-CM | POA: Diagnosis not present

## 2020-08-11 MED ORDER — AMLODIPINE BESYLATE 10 MG PO TABS
ORAL_TABLET | Freq: Every day | ORAL | 0 refills | Status: DC
  Filled 2020-08-11: qty 90, 90d supply, fill #0

## 2020-08-11 MED ORDER — CYCLOBENZAPRINE HCL 10 MG PO TABS
10.0000 mg | ORAL_TABLET | Freq: Three times a day (TID) | ORAL | 0 refills | Status: AC | PRN
Start: 2020-08-11 — End: 2021-08-11
  Filled 2020-08-11: qty 90, 30d supply, fill #0

## 2020-08-19 ENCOUNTER — Other Ambulatory Visit (HOSPITAL_COMMUNITY): Payer: Self-pay

## 2020-08-23 ENCOUNTER — Other Ambulatory Visit (HOSPITAL_COMMUNITY): Payer: Self-pay

## 2020-08-27 ENCOUNTER — Other Ambulatory Visit (HOSPITAL_COMMUNITY): Payer: Self-pay

## 2020-09-08 ENCOUNTER — Inpatient Hospital Stay: Payer: 59 | Attending: Oncology

## 2020-09-08 ENCOUNTER — Other Ambulatory Visit: Payer: Self-pay

## 2020-09-08 DIAGNOSIS — Z95828 Presence of other vascular implants and grafts: Secondary | ICD-10-CM

## 2020-09-08 DIAGNOSIS — Z452 Encounter for adjustment and management of vascular access device: Secondary | ICD-10-CM | POA: Diagnosis not present

## 2020-09-08 DIAGNOSIS — C20 Malignant neoplasm of rectum: Secondary | ICD-10-CM | POA: Insufficient documentation

## 2020-09-08 MED ORDER — SODIUM CHLORIDE 0.9% FLUSH
10.0000 mL | Freq: Once | INTRAVENOUS | Status: AC
  Administered 2020-09-08: 10 mL via INTRAVENOUS
  Filled 2020-09-08: qty 10

## 2020-09-08 MED ORDER — HEPARIN SOD (PORK) LOCK FLUSH 100 UNIT/ML IV SOLN
500.0000 [IU] | Freq: Once | INTRAVENOUS | Status: AC
  Administered 2020-09-08: 500 [IU] via INTRAVENOUS
  Filled 2020-09-08: qty 5

## 2020-09-24 NOTE — Progress Notes (Signed)
[facility]  [phone] [phone]  ID: [name] [name] OB: [birth year]  MR#: [id number]  STM#:[id number]  Patient Care Team: [place] as PCP - General (Family Medicine) Marlyce Huge, MD as Attending Physician (Surgery) Lloyd Huger, MD as Consulting Physician (Oncology) Abbie Sons, MD (Urology) Eulogio Bear, MD as Consulting Physician (Ophthalmology) Flora Lipps, MD as Consulting Physician (Pulmonary Disease)   I connected with [name] [name] on 10/02/20 at  2:15 PM EDT by video enabled telemedicine visit and verified that I am speaking with the correct person using two identifiers.   I discussed the limitations, risks, security and privacy concerns of performing an evaluation and management service by telemedicine and the availability of in-person appointments. I also discussed with the patient that there may be a patient responsible charge related to this service. The patient expressed understanding and agreed to proceed.   Other persons participating in the visit and their role in the encounter: Patient, MD.  Patient's location: Home. Provider's location: Clinic.  CHIEF COMPLAINT: Recurrent, stage IV rectal adenocarcinoma.  INTERVAL HISTORY: Patient agreed to video assisted telemedicine visit for further evaluation and discussion of his imaging results.  He continues to have occasional dyspnea on exertion, but otherwise feels well.  He currently feels well and is asymptomatic.  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  He has no neurologic complaints.  He denies any weakness or fatigue. He does not complain of peripheral neuropathy today.  He denies any recent fevers or illnesses.  He denies any chest pain, cough, or hemoptysis.  He denies nausea, vomiting, constipation, or diarrhea. He has noted no blood or melena in his colostomy bag.  He has no urinary  complaints.  Patient offers no specific complaints today.  REVIEW OF SYSTEMS:   Review of Systems  Constitutional:  Negative for fever, malaise/fatigue and weight loss.  Respiratory:  Positive for shortness of breath. Negative for cough.   Cardiovascular: Negative.  Negative for chest pain and leg swelling.  Gastrointestinal: Negative.  Negative for abdominal pain, blood in stool, constipation, diarrhea and melena.  Genitourinary: Negative.  Negative for dysuria.  Musculoskeletal: Negative.  Negative for back pain.  Skin: Negative.  Negative for rash.  Neurological: Negative.  Negative for tingling, sensory change, focal weakness, weakness and headaches.  Psychiatric/Behavioral: Negative.  The patient is not nervous/anxious and does not have insomnia.    As per HPI. Otherwise, a complete review of systems is negative.  PAST MEDICAL HISTORY: Past Medical History:  Diagnosis Date   Anemia    Cancer ([place])    rectal 2015, followed by Oncology, permanent colostomy   Colostomy in place [facility])    Dyspnea    on exertion   History of kidney stones    Pneumonia    Seasonal allergies     PAST SURGICAL HISTORY: Past Surgical History:  Procedure Laterality Date   BACK SURGERY     CHOLECYSTECTOMY     COLON SURGERY     ENDOBRONCHIAL ULTRASOUND N/A 07/01/2018   Procedure: ENDOBRONCHIAL ULTRASOUND;  Surgeon: Tyler Pita, MD;  Location: [facility];  Service: Cardiopulmonary;  Laterality: N/A;   FLEXIBLE BRONCHOSCOPY Left 11/20/2018   Procedure: FLEXIBLE BRONCHOSCOPY;  Surgeon: Tyler Pita, MD;  Location: [facility];  Service: Cardiopulmonary;  Laterality: Left;   INTERCOSTAL NERVE BLOCK Left 02/03/2019   Procedure: INTERCOSTAL NERVE BLOCK WITH EXPAREL;  Surgeon: Grace Isaac, MD;  Location:  [place] OR;  Service: Thoracic;  Laterality: Left;   KNEE SURGERY Bilateral    LOBECTOMY Left 02/03/2019   Procedure: LEFT UPPER LOBECTOMY WITH LYMPH NODE DISSECTION;  Surgeon: Grace Isaac,  MD;  Location: [place];  Service: Thoracic;  Laterality: Left;   PORT A CATH REVISION Left 08/05/2018   Procedure: PORT A CATH REVISION, REPOSITION LEFT;  Surgeon: Robert Bellow, MD;  Location: [place] [facility];  Service: General;  Laterality: Left;   PORT-A-CATH REMOVAL Right 12/02/2014   Procedure: REMOVAL PORT-A-CATH;  Surgeon: Marlyce Huge, MD;  Location: [facility];  Service: General;  Laterality: Right;   PORTACATH PLACEMENT     PORTACATH PLACEMENT Left 07/29/2018   Procedure: INSERTION PORT-A-CATH LEFT;  Surgeon: Robert Bellow, MD;  Location: [facility];  Service: General;  Laterality: Left;   THORACOTOMY/LOBECTOMY Left 02/03/2019   Procedure: MINITHORACOTOMY;  Surgeon: Grace Isaac, MD;  Location: [place];  Service: Thoracic;  Laterality: Left;   VIDEO ASSISTED THORACOSCOPY (VATS)/WEDGE RESECTION Left 02/03/2019   Procedure: VIDEO ASSISTED THORACOSCOPY (VATS)/LUNG RESECTION;  Surgeon: Grace Isaac, MD;  Location: [place];  Service: Thoracic;  Laterality: Left;   VIDEO BRONCHOSCOPY N/A 02/03/2019   Procedure: VIDEO BRONCHOSCOPY;  Surgeon: Grace Isaac, MD;  Location: [place];  Service: Thoracic;  Laterality: N/A;   VIDEO BRONCHOSCOPY WITH ENDOBRONCHIAL ULTRASOUND N/A 12/30/2018   Procedure: VIDEO BRONCHOSCOPY WITH ENDOBRONCHIAL ULTRASOUND;  Surgeon: Grace Isaac, MD;  Location: [place];  Service: Thoracic;  Laterality: N/A;    FAMILY HISTORY: Father with prostate cancer.  COPD, CHF.     ADVANCED DIRECTIVES:    HEALTH MAINTENANCE: Social History   Tobacco Use   Smoking status: Never   Smokeless tobacco: Current    Types: Chew   Tobacco comments:    might smoke a cigar once every 3 months  Vaping Use   Vaping Use: Never used  Substance Use Topics   Alcohol use: Yes    Alcohol/week: 0.0 standard drinks    Comment: RARE   Drug use: No     Colonoscopy:  PAP:  Bone density:  Lipid panel:  Allergies  Allergen Reactions   Sulfa Antibiotics Hives    Morphine And Related Other (See Comments)    Altered mental status    Current Outpatient Medications  Medication Sig Dispense Refill   albuterol (VENTOLIN HFA) 108 (90 Base) MCG/ACT inhaler INHALE 2 PUFFS BY MOUTH INTO THE LUNGS EVERY 6 HOURS AS NEEDED FOR WHEEZING OR SHORTNESS OF BREATH 18 g 1   amLODipine (NORVASC) 10 MG tablet TAKE 1 TABLET BY MOUTH DAILY 90 tablet 0   aspirin-acetaminophen-caffeine (EXCEDRIN MIGRAINE) 250-250-65 MG tablet Take 2 tablets by mouth every 6 (six) hours as needed for headache.     aspirin-sod bicarb-citric acid (ALKA-SELTZER) 325 MG TBEF tablet Take 650 mg by mouth every 6 (six) hours as needed (heartburn).     cetirizine (ZYRTEC) 10 MG tablet Take 10 mg by mouth at bedtime.      cyclobenzaprine (FLEXERIL) 10 MG tablet Take 1 tablet (10 mg total) by mouth 3 (three) times daily as needed for muscle spasms. 90 tablet 0   loperamide (IMODIUM) 2 MG capsule Take 4 mg by mouth as needed for diarrhea or loose stools.      Multiple Vitamin (MULTIVITAMIN WITH MINERALS) TABS tablet Take 1 tablet by mouth every evening.      Naphazoline-Pheniramine (VISINE-A OP) Place 1 drop into both eyes daily as needed (allergies).     NEEDLE,  DISP, 18 G 18G X 1" MISC USE AS DIRECTED 50 each 0   SYRINGE-NEEDLE, DISP, 3 ML (LUER LOCK SAFETY SYRINGES) 21G X 1-1/2" 3 ML MISC Use as directed for testosterone administration 50 each 0   tadalafil (CIALIS) 20 MG tablet TAKE 1 TAB BY MOUTH 1 HOUR PRIOR TO INTERCOURSE AS NEEDED 30 tablet 3   testosterone cypionate (DEPOTESTOSTERONE CYPIONATE) 200 MG/ML injection INJECT 1 ML INTO THE MUSCLE EVERY 14 DAYS 10 mL 0   No current facility-administered medications for this visit.    OBJECTIVE: There were no vitals filed for this visit.   There is no height or weight on file to calculate BMI.    ECOG FS:0 - Asymptomatic  General: Well-developed, well-nourished, no acute distress. HEENT: Normocephalic. Neuro: Alert, answering all questions  appropriately. Cranial nerves grossly intact. Psych: Normal affect.   LAB RESULTS:  Lab Results  Component Value Date   NA 139 09/16/2019   K 4.1 09/16/2019   CL 103 09/16/2019   CO2 27 09/16/2019   GLUCOSE 103 (H) 09/16/2019   BUN 26 (H) 09/16/2019   CREATININE 0.90 09/28/2020   CALCIUM 9.2 09/16/2019   PROT 7.2 09/16/2019   ALBUMIN 4.1 09/16/2019   AST 26 09/16/2019   ALT 29 09/16/2019   ALKPHOS 81 09/16/2019   BILITOT 0.5 09/16/2019   GFRNONAA >60 03/19/2020   GFRAA >60 09/16/2019    Lab Results  Component Value Date   WBC 4.5 09/16/2019   NEUTROABS 2.6 09/16/2019   HGB 12.6 (L) 09/16/2019   HCT 46.0 06/08/2020   MCV 86.0 09/16/2019   PLT 332 09/16/2019     STUDIES: CT CHEST ABDOMEN PELVIS W CONTRAST  Result Date: 09/29/2020 CLINICAL DATA:  Restaging LEFT lung cancer. LEFT upper lobectomy. Additional history of colorectal carcinoma with partial colon resection. EXAM: CT CHEST, ABDOMEN, AND PELVIS WITH CONTRAST TECHNIQUE: Multidetector CT imaging of the chest, abdomen and pelvis was performed following the standard protocol during bolus administration of intravenous contrast. CONTRAST:  167m OMNIPAQUE IOHEXOL 350 MG/ML SOLN COMPARISON:  03/19/2020 FINDINGS: CT CHEST FINDINGS Cardiovascular: No significant vascular findings. Normal heart size. No pericardial effusion. Mediastinum/Nodes: Port in the anterior chest wall with tip in distal SVC. No axillary or supraclavicular adenopathy. No mediastinal or hilar adenopathy. No pericardial fluid. Esophagus normal. Lungs/Pleura: No suspicious pulmonary nodules.  Post LEFT lobectomy. Musculoskeletal: No aggressive osseous lesion. CT ABDOMEN AND PELVIS FINDINGS Hepatobiliary: No focal hepatic lesion. No biliary ductal dilatation. Gallbladder is normal. Common bile duct is normal. Pancreas: No focal hepatic lesion. Postcholecystectomy. No biliary dilatation. Spleen: Normal spleen. Adrenals/urinary tract: Bilateral tiny nonobstructing  renal calculi. Ureters and bladder normal. Adrenals normal. Stomach/Bowel: Stomach, duodenum and small-bowel normal. LEFT abdominal wall colostomy. Post prostatectomy. Diverticula of the descending colon. No acute inflammation. No bowel obstruction. Soft tissue thickening in the presacral space similar to prior. No discrete nodularity. No adenopathy in the mesocolon. Vascular/Lymphatic: Abdominal aorta is normal caliber. There is no retroperitoneal or periportal lymphadenopathy. No pelvic lymphadenopathy. Reproductive: Prostate unremarkable Other: No free fluid. Musculoskeletal: No aggressive osseous lesion. IMPRESSION: Chest Impression: 1. Post LEFT lobectomy.  No evidence of lung cancer recurrence. 2. No lymphadenopathy. Abdomen / Pelvis Impression: 1. No evidence of colorectal carcinoma local recurrence or metastasis in the abdomen pelvis. 2. LEFT abdominal wall colostomy without complication. 3. Stable postsurgical change in the presacral space. Electronically Signed   By: SSuzy BouchardM.D.   On: 09/29/2020 09:26    ONCOLOGY HISTORY:  Patient was initially considered  a clinical stage IIIa, but after completing neoadjuvant 5-FU and XRT followed by surgical excision on October 08, 2013 he was noted to be pathologic stage Ia.  He declined adjuvant FOLFOX at that time.  More recently, patient's CEA started trending up and CT of the chest revealed a large 7.7 cm left upper lobe mass biopsy consistent with rectal adenocarcinoma.  Patient completed cycle 6 of FOLFOX plus Avastin on October 16, 2018.  Repeat biopsy did not reveal any evidence of malignancy, but persistent fungal infection.  Patient underwent repeat bronchoscopy on December 30, 2018 which did in fact reveal residual malignancy.  He subsequently underwent surgical resection on February 03, 2019 with complete removal of residual disease.  One lymph node had a "fragment" of malignancy.  Patient did not require additional adjuvant chemotherapy or  XRT.   ASSESSMENT: Recurrent, stage IV rectal adenocarcinoma.  PLAN:    1.  Recurrent, stage IV rectal adenocarcinoma: No evidence of disease.  Patient's most recent CT scan on September 29, 2020 reviewed independently and reported as above with no obvious evidence of recurrent or progressive disease.  CEA also continues to be within normal limits.  No intervention is needed. Continue routine imaging every 6 months for a minimum of 2 years after his surgery in January 2023.  Return to clinic in 6 months with repeat imaging and video assisted telemedicine visit at which point patient can likely be transitioned to yearly imaging and evaluation. 2.  Cough: Patient does not complain of this today.  Continue Tessalon Perles as needed. 3.  Dyspnea on exertion: Chronic and unchanged.  I provided 20 minutes of face-to-face video visit time during this encounter which included chart review, counseling, and coordination of care as documented above.    Patient expressed understanding and was in agreement with this plan. He also understands that He can call clinic at any time with any questions, concerns, or complaints.    Lloyd Huger, MD   10/02/2020 7:36 AM

## 2020-09-28 ENCOUNTER — Inpatient Hospital Stay: Payer: 59 | Attending: Oncology

## 2020-09-28 ENCOUNTER — Other Ambulatory Visit: Payer: Self-pay

## 2020-09-28 ENCOUNTER — Ambulatory Visit
Admission: RE | Admit: 2020-09-28 | Discharge: 2020-09-28 | Disposition: A | Discharge status: Home / Self Care | Payer: 59 | Source: Ambulatory Visit | Attending: Oncology | Admitting: Oncology

## 2020-09-28 DIAGNOSIS — C3492 Malignant neoplasm of unspecified part of left bronchus or lung: Secondary | ICD-10-CM | POA: Diagnosis not present

## 2020-09-28 DIAGNOSIS — C20 Malignant neoplasm of rectum: Secondary | ICD-10-CM | POA: Insufficient documentation

## 2020-09-28 DIAGNOSIS — Z9889 Other specified postprocedural states: Secondary | ICD-10-CM | POA: Diagnosis not present

## 2020-09-28 DIAGNOSIS — N200 Calculus of kidney: Secondary | ICD-10-CM | POA: Diagnosis not present

## 2020-09-28 DIAGNOSIS — Z9049 Acquired absence of other specified parts of digestive tract: Secondary | ICD-10-CM | POA: Diagnosis not present

## 2020-09-28 DIAGNOSIS — Z902 Acquired absence of lung [part of]: Secondary | ICD-10-CM | POA: Diagnosis not present

## 2020-09-28 DIAGNOSIS — K3189 Other diseases of stomach and duodenum: Secondary | ICD-10-CM | POA: Diagnosis not present

## 2020-09-28 DIAGNOSIS — Z95828 Presence of other vascular implants and grafts: Secondary | ICD-10-CM

## 2020-09-28 LAB — POCT I-STAT CREATININE: Creatinine, Ser: 0.9 mg/dL (ref 0.61–1.24)

## 2020-09-28 MED ORDER — SODIUM CHLORIDE 0.9% FLUSH
10.0000 mL | Freq: Once | INTRAVENOUS | Status: AC
  Administered 2020-09-28: 10 mL via INTRAVENOUS
  Filled 2020-09-28: qty 10

## 2020-09-28 MED ORDER — HEPARIN SOD (PORK) LOCK FLUSH 100 UNIT/ML IV SOLN
INTRAVENOUS | Status: AC
  Filled 2020-09-28: qty 5

## 2020-09-28 MED ORDER — HEPARIN SOD (PORK) LOCK FLUSH 100 UNIT/ML IV SOLN
500.0000 [IU] | Freq: Once | INTRAVENOUS | Status: AC
  Administered 2020-09-28: 500 [IU] via INTRAVENOUS
  Filled 2020-09-28: qty 5

## 2020-09-28 MED ORDER — IOHEXOL 350 MG/ML SOLN
100.0000 mL | Freq: Once | INTRAVENOUS | Status: AC | PRN
  Administered 2020-09-28: 100 mL via INTRAVENOUS

## 2020-09-29 LAB — CEA: CEA: 1.5 ng/mL (ref 0.0–4.7)

## 2020-09-30 ENCOUNTER — Encounter: Payer: Self-pay | Admitting: Oncology

## 2020-09-30 ENCOUNTER — Other Ambulatory Visit (HOSPITAL_COMMUNITY): Payer: Self-pay

## 2020-09-30 ENCOUNTER — Inpatient Hospital Stay (HOSPITAL_BASED_OUTPATIENT_CLINIC_OR_DEPARTMENT_OTHER): Payer: 59 | Admitting: Oncology

## 2020-09-30 DIAGNOSIS — C20 Malignant neoplasm of rectum: Secondary | ICD-10-CM | POA: Diagnosis not present

## 2020-09-30 NOTE — Progress Notes (Signed)
Patient assessed for video visit.

## 2020-10-02 ENCOUNTER — Encounter: Payer: Self-pay | Admitting: Oncology

## 2020-10-15 ENCOUNTER — Encounter: Payer: 59 | Admitting: Internal Medicine

## 2020-10-26 DIAGNOSIS — Z933 Colostomy status: Secondary | ICD-10-CM | POA: Diagnosis not present

## 2020-10-27 ENCOUNTER — Inpatient Hospital Stay: Payer: 59

## 2020-11-08 ENCOUNTER — Other Ambulatory Visit (HOSPITAL_COMMUNITY): Payer: Self-pay

## 2020-12-06 DIAGNOSIS — Z933 Colostomy status: Secondary | ICD-10-CM | POA: Diagnosis not present

## 2020-12-08 ENCOUNTER — Encounter: Payer: Self-pay | Admitting: Oncology

## 2020-12-11 ENCOUNTER — Encounter: Payer: Self-pay | Admitting: Oncology

## 2020-12-15 ENCOUNTER — Other Ambulatory Visit: Payer: Self-pay

## 2020-12-15 ENCOUNTER — Inpatient Hospital Stay: Payer: Medicare HMO | Attending: Oncology

## 2020-12-15 DIAGNOSIS — Z95828 Presence of other vascular implants and grafts: Secondary | ICD-10-CM

## 2020-12-15 DIAGNOSIS — Z452 Encounter for adjustment and management of vascular access device: Secondary | ICD-10-CM | POA: Insufficient documentation

## 2020-12-15 DIAGNOSIS — C20 Malignant neoplasm of rectum: Secondary | ICD-10-CM | POA: Diagnosis present

## 2020-12-15 MED ORDER — HEPARIN SOD (PORK) LOCK FLUSH 100 UNIT/ML IV SOLN
INTRAVENOUS | Status: AC
  Administered 2020-12-15: 500 [IU] via INTRAVENOUS
  Filled 2020-12-15: qty 5

## 2020-12-15 MED ORDER — SODIUM CHLORIDE 0.9% FLUSH
10.0000 mL | INTRAVENOUS | Status: DC | PRN
  Administered 2020-12-15: 10 mL via INTRAVENOUS
  Filled 2020-12-15: qty 10

## 2020-12-15 MED ORDER — HEPARIN SOD (PORK) LOCK FLUSH 100 UNIT/ML IV SOLN
500.0000 [IU] | Freq: Once | INTRAVENOUS | Status: AC
  Filled 2020-12-15: qty 5

## 2021-01-03 DIAGNOSIS — Z933 Colostomy status: Secondary | ICD-10-CM | POA: Diagnosis not present

## 2021-01-31 ENCOUNTER — Encounter: Payer: Self-pay | Admitting: Family Medicine

## 2021-01-31 ENCOUNTER — Encounter: Payer: Self-pay | Admitting: Oncology

## 2021-02-01 ENCOUNTER — Other Ambulatory Visit: Payer: Self-pay | Admitting: Family Medicine

## 2021-02-01 ENCOUNTER — Other Ambulatory Visit: Payer: Self-pay | Admitting: Emergency Medicine

## 2021-02-01 ENCOUNTER — Other Ambulatory Visit: Payer: Self-pay

## 2021-02-01 DIAGNOSIS — C20 Malignant neoplasm of rectum: Secondary | ICD-10-CM

## 2021-02-01 DIAGNOSIS — I10 Essential (primary) hypertension: Secondary | ICD-10-CM

## 2021-02-01 MED ORDER — ALBUTEROL SULFATE HFA 108 (90 BASE) MCG/ACT IN AERS: 2.0000 | INHALATION_SPRAY | Freq: Four times a day (QID) | RESPIRATORY_TRACT | 1 refills | Status: DC | PRN

## 2021-02-01 MED ORDER — AMLODIPINE BESYLATE 10 MG PO TABS: ORAL_TABLET | Freq: Every day | ORAL | 0 refills | Status: DC

## 2021-02-02 ENCOUNTER — Inpatient Hospital Stay: Payer: Medicare HMO | Attending: Oncology

## 2021-02-02 DIAGNOSIS — Z933 Colostomy status: Secondary | ICD-10-CM | POA: Diagnosis not present

## 2021-02-04 ENCOUNTER — Encounter: Payer: Self-pay | Admitting: Oncology

## 2021-02-05 ENCOUNTER — Other Ambulatory Visit: Payer: Self-pay | Admitting: Family Medicine

## 2021-02-05 DIAGNOSIS — I10 Essential (primary) hypertension: Secondary | ICD-10-CM

## 2021-02-05 NOTE — Telephone Encounter (Signed)
Requested Prescriptions  Pending Prescriptions Disp Refills   amLODipine (NORVASC) 10 MG tablet [Pharmacy Med Name: AMLODIPINE BESYLATE 10 MG TAB] 90 tablet 0    Sig: TAKE 1 TABLET BY MOUTH EVERY DAY     Cardiovascular:  Calcium Channel Blockers Failed - 02/05/2021  2:12 PM      Failed - Last BP in normal range    BP Readings from Last 1 Encounters:  08/11/20 140/86         Passed - Valid encounter within last 6 months    Recent Outpatient Visits          5 months ago Essential hypertension   [facility] Steele Sizer, MD   1 year ago Spasm of muscle of lower back   [facility] [place] Malkin, [place]   2 years ago Annual physical exam   [place], [place]   3 years ago Low testosterone in male   [facility] [place], [place]   3 years ago Needs flu shot   [place], [place]      Future Appointments            In 6 days Steele Sizer, MD [facility], [place]   In 1 month [place], Kathlene November, MD [place] [facility] [place]   In 4 months [place], [place], [place]

## 2021-02-07 ENCOUNTER — Encounter: Payer: Self-pay | Admitting: Family Medicine

## 2021-02-11 ENCOUNTER — Ambulatory Visit: Payer: 59 | Admitting: Family Medicine

## 2021-02-21 ENCOUNTER — Encounter: Payer: Self-pay | Admitting: Family Medicine

## 2021-02-22 ENCOUNTER — Telehealth (INDEPENDENT_AMBULATORY_CARE_PROVIDER_SITE_OTHER): Payer: Self-pay | Admitting: Unknown Physician Specialty

## 2021-02-22 DIAGNOSIS — J40 Bronchitis, not specified as acute or chronic: Secondary | ICD-10-CM

## 2021-02-22 MED ORDER — BUDESONIDE-FORMOTEROL FUMARATE 160-4.5 MCG/ACT IN AERO: 2.0000 | INHALATION_SPRAY | Freq: Two times a day (BID) | RESPIRATORY_TRACT | 3 refills | Status: DC

## 2021-02-22 MED ORDER — BENZONATATE 100 MG PO CAPS: 100.0000 mg | ORAL_CAPSULE | Freq: Two times a day (BID) | ORAL | 0 refills | Status: DC | PRN

## 2021-02-22 NOTE — Progress Notes (Signed)
There were no vitals taken for this visit.   Subjective:    Patient ID: [name] [name], male    DOB: 12-07-1955, 66 y.o.   MRN: [id number]  HPI: [name] [name] is a 66 y.o. male  Chief Complaint  Patient presents with   Cough    Pt believes its bronchitis, no covid test done, pt was around granddaughter that had a cold.   Nasal Congestion   Due to the catastrophic nature of the COVID-19 pandemic, this visit was completed via audio and visual contact via [employer] due to the restrictions of the COVID-19 pandemic. All issues as above were discussed and addressed. Physical exam was done as above through visual confirmation on [employer] If it was felt that the patient should be evaluated in the office, they were directed there. The patient verbally consented to this visit."} Location of the patient: home Location of the provider: work Those involved with this call:  Time spent on call: 20 minutes on video I verified patient identity using two factors (patient name and date of birth). Patient consents verbally to being seen via telemedicine visit today.  Cough This is a new problem. The current episode started in the past 7 days. The problem has been unchanged. The cough is Productive of sputum (clear sputum). Associated symptoms include rhinorrhea and wheezing. Pertinent negatives include no chest pain, chills, ear congestion, ear pain, fever, headaches, heartburn, hemoptysis, myalgias, nasal congestion, postnasal drip or shortness of breath. The symptoms are aggravated by cold air and lying down. He has tried a beta-agonist inhaler for the symptoms. The treatment provided mild relief. metastatic disease to lung, in remission     Relevant past medical, surgical, family and social history reviewed and updated as indicated. Interim medical history since our last visit reviewed. Allergies and medications reviewed and updated.  Review of Systems  Constitutional:  Negative for chills and  fever.  HENT:  Positive for rhinorrhea. Negative for ear pain and postnasal drip.   Respiratory:  Positive for cough and wheezing. Negative for hemoptysis and shortness of breath.   Cardiovascular:  Negative for chest pain.  Gastrointestinal:  Negative for heartburn.  Musculoskeletal:  Negative for myalgias.  Neurological:  Negative for headaches.   Per HPI unless specifically indicated above     Objective:    There were no vitals taken for this visit.  Wt Readings from Last 3 Encounters:  08/11/20 284 lb (128.8 kg)  06/17/20 275 lb (124.7 kg)  02/05/20 297 lb (134.7 kg)    Physical Exam Constitutional:      General: He is not in acute distress.    Appearance: Normal appearance. He is well-developed.  HENT:     Head: Normocephalic and atraumatic.  Eyes:     General: Lids are normal. No scleral icterus.       Right eye: No discharge.        Left eye: No discharge.     Conjunctiva/sclera: Conjunctivae normal.  Pulmonary:     Effort: Pulmonary effort is normal.  Abdominal:     Palpations: There is no hepatomegaly or splenomegaly.  Musculoskeletal:        General: Normal range of motion.  Skin:    Coloration: Skin is not pale.     Findings: No rash.  Neurological:     Mental Status: He is alert and oriented to person, place, and time.  Psychiatric:        Behavior: Behavior normal.  Thought Content: Thought content normal.        Judgment: Judgment normal.    Results for orders placed or performed during the hospital encounter of 09/28/20  I-STAT creatinine  Result Value Ref Range   Creatinine, Ser 0.90 0.61 - 1.24 mg/dL      Assessment & Plan:   Problem List Items Addressed This Visit   None Visit Diagnoses     Bronchitis    -  Primary   No indication of bacterial infection.  Rx for Symbicort to take for about 2 weeks. Tessalon perles for cough.  RTC if worsening        Follow up plan: Return if symptoms worsen or fail to improve.

## 2021-03-01 DIAGNOSIS — R051 Acute cough: Secondary | ICD-10-CM | POA: Diagnosis not present

## 2021-03-01 DIAGNOSIS — J209 Acute bronchitis, unspecified: Secondary | ICD-10-CM | POA: Diagnosis not present

## 2021-03-01 DIAGNOSIS — Z20822 Contact with and (suspected) exposure to covid-19: Secondary | ICD-10-CM | POA: Diagnosis not present

## 2021-03-01 DIAGNOSIS — Z03818 Encounter for observation for suspected exposure to other biological agents ruled out: Secondary | ICD-10-CM | POA: Diagnosis not present

## 2021-03-05 DIAGNOSIS — Z933 Colostomy status: Secondary | ICD-10-CM | POA: Diagnosis not present

## 2021-03-11 ENCOUNTER — Ambulatory Visit: Payer: Self-pay | Admitting: Family Medicine

## 2021-03-21 ENCOUNTER — Encounter: Payer: Self-pay | Admitting: Oncology

## 2021-03-24 NOTE — Progress Notes (Signed)
[facility]  Telephone:([phone] Fax:([phone]  ID: Aquila Delaughter Burdett OB: 1955/07/04  MR#: [id number]  NGE#:[id number]  Patient Care Team: [place] as PCP - General (Family Medicine) Marlyce Huge, MD as Attending Physician (Surgery) Lloyd Huger, MD as Consulting Physician (Oncology) Abbie Sons, MD (Urology) Eulogio Bear, MD as Consulting Physician (Ophthalmology) Flora Lipps, MD as Consulting Physician (Pulmonary Disease)   I connected with Barbara Cower Ellzey on 03/30/21 at 11:00 AM EST by video enabled telemedicine visit and verified that I am speaking with the correct person using two identifiers.   I discussed the limitations, risks, security and privacy concerns of performing an evaluation and management service by telemedicine and the availability of in-person appointments. I also discussed with the patient that there may be a patient responsible charge related to this service. The patient expressed understanding and agreed to proceed.   Other persons participating in the visit and their role in the encounter: Patient, MD.  Patients location: Home. Providers location: Clinic.  CHIEF COMPLAINT: Recurrent, stage IV rectal adenocarcinoma.  INTERVAL HISTORY: Patient agreed to video assisted telemedicine visit for routine 53-monthevaluation and discussion of his imaging results.  He feels well and is asymptomatic.  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  He has no neurologic complaints.  He denies any weakness or fatigue. He does not complain of peripheral neuropathy today.  He denies any recent fevers or illnesses.  He denies any chest pain, cough, or hemoptysis.  He denies nausea, vomiting, constipation, or diarrhea. He has noted no blood or melena in his colostomy bag.  He has no urinary complaints.  Patient offers no specific complaints today.  REVIEW OF SYSTEMS:    Review of Systems  Constitutional:  Negative for fever, malaise/fatigue and weight loss.  Respiratory: Negative.  Negative for cough and shortness of breath.   Cardiovascular: Negative.  Negative for chest pain and leg swelling.  Gastrointestinal: Negative.  Negative for abdominal pain, blood in stool, constipation, diarrhea and melena.  Genitourinary: Negative.  Negative for dysuria.  Musculoskeletal: Negative.  Negative for back pain.  Skin: Negative.  Negative for rash.  Neurological: Negative.  Negative for tingling, sensory change, focal weakness, weakness and headaches.  Psychiatric/Behavioral: Negative.  The patient is not nervous/anxious and does not have insomnia.    As per HPI. Otherwise, a complete review of systems is negative.  PAST MEDICAL HISTORY: Past Medical History:  Diagnosis Date   Anemia    Cancer ([place]    rectal 2015, followed by Oncology, permanent colostomy   Colostomy in place ([facility]    Dyspnea    on exertion   History of kidney stones    Pneumonia    Seasonal allergies     PAST SURGICAL HISTORY: Past Surgical History:  Procedure Laterality Date   BACK SURGERY     CHOLECYSTECTOMY     COLON SURGERY     ENDOBRONCHIAL ULTRASOUND N/A 07/01/2018   Procedure: ENDOBRONCHIAL ULTRASOUND;  Surgeon: GTyler Pita MD;  Location: [facility];  Service: Cardiopulmonary;  Laterality: N/A;   FLEXIBLE BRONCHOSCOPY Left 11/20/2018   Procedure: FLEXIBLE BRONCHOSCOPY;  Surgeon: GTyler Pita MD;  Location: [facility];  Service: Cardiopulmonary;  Laterality: Left;   INTERCOSTAL NERVE BLOCK Left 02/03/2019   Procedure: INTERCOSTAL NERVE BLOCK WITH EXPAREL;  Surgeon: GGrace Isaac MD;  [place]  Service: Thoracic;  Laterality: Left;   KNEE SURGERY Bilateral  LOBECTOMY Left 02/03/2019   Procedure: LEFT UPPER LOBECTOMY WITH LYMPH NODE DISSECTION;  Surgeon: Grace Isaac, MD;  Location: [place];  Service: Thoracic;  Laterality: Left;   PORT A CATH  REVISION Left 08/05/2018   Procedure: PORT A CATH REVISION, REPOSITION LEFT;  Surgeon: Robert Bellow, MD;  Location: [place] [facility];  Service: General;  Laterality: Left;   PORT-A-CATH REMOVAL Right 12/02/2014   Procedure: REMOVAL PORT-A-CATH;  Surgeon: Marlyce Huge, MD;  Location: [facility];  Service: General;  Laterality: Right;   PORTACATH PLACEMENT     PORTACATH PLACEMENT Left 07/29/2018   Procedure: INSERTION PORT-A-CATH LEFT;  Surgeon: Robert Bellow, MD;  Location: [facility];  Service: General;  Laterality: Left;   THORACOTOMY/LOBECTOMY Left 02/03/2019   Procedure: MINITHORACOTOMY;  Surgeon: Grace Isaac, MD;  Location: [place];  Service: Thoracic;  Laterality: Left;   VIDEO ASSISTED THORACOSCOPY (VATS)/WEDGE RESECTION Left 02/03/2019   Procedure: VIDEO ASSISTED THORACOSCOPY (VATS)/LUNG RESECTION;  Surgeon: Grace Isaac, MD;  Location: [place];  Service: Thoracic;  Laterality: Left;   VIDEO BRONCHOSCOPY N/A 02/03/2019   Procedure: VIDEO BRONCHOSCOPY;  Surgeon: Grace Isaac, MD;  Location: [place];  Service: Thoracic;  Laterality: N/A;   VIDEO BRONCHOSCOPY WITH ENDOBRONCHIAL ULTRASOUND N/A 12/30/2018   Procedure: VIDEO BRONCHOSCOPY WITH ENDOBRONCHIAL ULTRASOUND;  Surgeon: Grace Isaac, MD;  Location: [place];  Service: Thoracic;  Laterality: N/A;    FAMILY HISTORY: Father with prostate cancer.  COPD, CHF.     ADVANCED DIRECTIVES:    HEALTH MAINTENANCE: Social History   Tobacco Use   Smoking status: Never   Smokeless tobacco: Current    Types: Chew   Tobacco comments:    might smoke a cigar once every 3 months  Vaping Use   Vaping Use: Never used  Substance Use Topics   Alcohol use: Yes    Alcohol/week: 0.0 standard drinks    Comment: RARE   Drug use: No     Colonoscopy:  PAP:  Bone density:  Lipid panel:  Allergies  Allergen Reactions   Sulfa Antibiotics Hives   Morphine And Related Other (See Comments)    Altered mental status    Current  Outpatient Medications  Medication Sig Dispense Refill   albuterol (VENTOLIN HFA) 108 (90 Base) MCG/ACT inhaler INHALE 2 PUFFS BY MOUTH INTO THE LUNGS EVERY 6 HOURS AS NEEDED FOR WHEEZING OR SHORTNESS OF BREATH 18 g 1   amLODipine (NORVASC) 10 MG tablet TAKE 1 TABLET BY MOUTH EVERY DAY 90 tablet 0   aspirin-acetaminophen-caffeine (EXCEDRIN MIGRAINE) 250-250-65 MG tablet Take 2 tablets by mouth every 6 (six) hours as needed for headache.     aspirin-sod bicarb-citric acid (ALKA-SELTZER) 325 MG TBEF tablet Take 650 mg by mouth every 6 (six) hours as needed (heartburn).     cetirizine (ZYRTEC) 10 MG tablet Take 10 mg by mouth at bedtime.      cyclobenzaprine (FLEXERIL) 10 MG tablet Take 1 tablet (10 mg total) by mouth 3 (three) times daily as needed for muscle spasms. 90 tablet 0   loperamide (IMODIUM) 2 MG capsule Take 4 mg by mouth as needed for diarrhea or loose stools.      Multiple Vitamin (MULTIVITAMIN WITH MINERALS) TABS tablet Take 1 tablet by mouth every evening.      Naphazoline-Pheniramine (VISINE-A OP) Place 1 drop into both eyes daily as needed (allergies).     SYRINGE-NEEDLE, DISP, 3 ML (LUER LOCK SAFETY SYRINGES) 21G X 1-1/2" 3 ML MISC Use as  directed for testosterone administration 50 each 0   tadalafil (CIALIS) 20 MG tablet TAKE 1 TAB BY MOUTH 1 HOUR PRIOR TO INTERCOURSE AS NEEDED 30 tablet 3   testosterone cypionate (DEPOTESTOSTERONE CYPIONATE) 200 MG/ML injection INJECT 1 ML INTO THE MUSCLE EVERY 14 DAYS 10 mL 0   No current facility-administered medications for this visit.    OBJECTIVE: There were no vitals filed for this visit.   There is no height or weight on file to calculate BMI.    ECOG FS:0 - Asymptomatic  General: Well-developed, well-nourished, no acute distress. HEENT: Normocephalic. Neuro: Alert, answering all questions appropriately. Cranial nerves grossly intact. Psych: Normal affect.   LAB RESULTS:  Lab Results  Component Value Date   NA 139 09/16/2019    K 4.1 09/16/2019   CL 103 09/16/2019   CO2 27 09/16/2019   GLUCOSE 103 (H) 09/16/2019   BUN 26 (H) 09/16/2019   CREATININE 0.77 03/25/2021   CALCIUM 9.2 09/16/2019   PROT 7.2 09/16/2019   ALBUMIN 4.1 09/16/2019   AST 26 09/16/2019   ALT 29 09/16/2019   ALKPHOS 81 09/16/2019   BILITOT 0.5 09/16/2019   GFRNONAA >60 03/25/2021   GFRAA >60 09/16/2019    Lab Results  Component Value Date   WBC 4.5 09/16/2019   NEUTROABS 2.6 09/16/2019   HGB 12.6 (L) 09/16/2019   HCT 46.0 06/08/2020   MCV 86.0 09/16/2019   PLT 332 09/16/2019     STUDIES: CT CHEST ABDOMEN PELVIS W CONTRAST  Result Date: 03/25/2021 CLINICAL DATA:  History of left lung cancer status post left upper lobectomy. History of colorectal carcinoma status post partial colectomy. Restaging/follow-up. * onc * EXAM: CT CHEST, ABDOMEN, AND PELVIS WITH CONTRAST TECHNIQUE: Multidetector CT imaging of the chest, abdomen and pelvis was performed following the standard protocol during bolus administration of intravenous contrast. RADIATION DOSE REDUCTION: This exam was performed according to the departmental dose-optimization program which includes automated exposure control, adjustment of the mA and/or kV according to patient size and/or use of iterative reconstruction technique. CONTRAST:  134m OMNIPAQUE IOHEXOL 350 MG/ML SOLN COMPARISON:  Multiple priors including most recent CT September 28, 2020 FINDINGS: CT CHEST FINDINGS Cardiovascular: Accessed left chest Port-A-Cath with tip in the right atrium. Aortic atherosclerosis without aneurysmal dilation. No central pulmonary embolus on this nondedicated study. Normal size heart. No significant pericardial effusion/thickening. Mediastinum/Nodes: No supraclavicular adenopathy. No discrete thyroid nodule. No pathologically enlarged mediastinal, hilar or axillary lymph nodes. Calcified left hilar lymph nodes. Esophagus is grossly unremarkable. Lungs/Pleura: Stable changes of prior left upper  lobectomy. There is new clustered nodularity in the right middle lobe and dependent lung bases with a dominant 6 mm left lower lobe pulmonary nodule on image 119/3 and a dominant 5 mm right lower lobe pulmonary nodule on image 130/3. No pleural effusion or pneumothorax. Musculoskeletal: Multilevel degenerative changes spine. No aggressive lytic or blastic lesion of bone. CT ABDOMEN PELVIS FINDINGS Hepatobiliary: No new suspicious hepatic lesion. Subtle hypodense segment V foci along the gallbladder fossa on image 70/2 and segment IV foci along the falciform ligament on image 55/2 are similar over multiple prior studies dating back to at least September 16, 2019, and in locations commonly reflecting focal fatty infiltration. Enhancing 14 mm focus in the right lobe of the liver on image 60/2 is stable dating back to at least 10/29/2015 consistent with a benign etiology such as a intrahepatic shunt or flash filling hemangioma. Gallbladder surgically absent. No biliary ductal dilation. Pancreas: No pancreatic ductal  dilation or evidence of acute inflammation. Spleen: No splenomegaly or focal splenic lesion. Adrenals/Urinary Tract: Bilateral adrenal glands appear normal. No hydronephrosis. Punctate nonobstructive right lower pole renal calculi. Subcentimeter bilateral hypodense renal lesions are technically too small to accurately characterize but statistically likely to reflect cysts. The kidneys demonstrate symmetric enhancement and excretion of contrast material. Urinary bladder is unremarkable for degree of distension. Stomach/Bowel: Radiopaque enteric contrast material traverses the ascending colon small hiatal hernia otherwise stomach is unremarkable for degree of distension. No pathologic dilation of small or large bowel. Colonic diverticulosis without findings of acute diverticulitis. Similar presacral/central pelvis postsurgical change with and sigmoid colostomy in the left anterior abdominal wall.  Vascular/Lymphatic: Aortic atherosclerosis without abdominal aortic aneurysm. No pathologically enlarged abdominal or pelvic lymph nodes. Reproductive: Prostate is unremarkable. Other: No significant abdominopelvic free fluid. Bilateral fat containing inguinal hernias. Musculoskeletal: Multilevel degenerative changes spine. No aggressive lytic or blastic lesion of bone. IMPRESSION: 1. New clustered nodularity in the right middle lobe and dependent bilateral lower lobes with dominant nodules measuring up to 6 mm. Findings most likely reflect an infectious or inflammatory process and are in a pattern suggestive of aspiration. However, while less likely metastatic disease is not excluded on this examination. Recommend attention on close interval follow-up dedicated chest CT. 2. Stable changes of prior left upper lobectomy. 3. No evidence of thoracic adenopathy or abdominopelvic metastatic disease. 4. Prior partial colectomy with similar postsurgical changes in the pelvis/presacral space and stable left abdominal wall end colostomy. 5. Colonic diverticulosis without findings of acute diverticulitis. 6. Punctate nonobstructive right renal calculi. 7. Bilateral fat containing inguinal hernias. 8.  Aortic Atherosclerosis (ICD10-I70.0). Electronically Signed   By: Dahlia Bailiff M.D.   On: 03/25/2021 12:35    ONCOLOGY HISTORY:  Patient was initially considered a clinical stage IIIa, but after completing neoadjuvant 5-FU and XRT followed by surgical excision on October 08, 2013 he was noted to be pathologic stage Ia.  He declined adjuvant FOLFOX at that time.  More recently, patient's CEA started trending up and CT of the chest revealed a large 7.7 cm left upper lobe mass biopsy consistent with rectal adenocarcinoma.  Patient completed cycle 6 of FOLFOX plus Avastin on October 16, 2018.  Repeat biopsy did not reveal any evidence of malignancy, but persistent fungal infection.  Patient underwent repeat bronchoscopy on  December 30, 2018 which did in fact reveal residual malignancy.  He subsequently underwent surgical resection on February 03, 2019 with complete removal of residual disease.  One lymph node had a "fragment" of malignancy.  Patient did not require additional adjuvant chemotherapy or XRT.   ASSESSMENT: Recurrent, stage IV rectal adenocarcinoma.  PLAN:    1.  Recurrent, stage IV rectal adenocarcinoma: See oncology history as above.  No evidence of disease.  Patient's most recent CT scan on March 25, 2021 reviewed independently and reported as above with no obvious evidence of recurrent or progressive disease, but did note new "clustered nodularity" in the right middle lobe possibly infectious or aspiration related.  Patient reports he recently had bronchitis.  CEA continues to be within normal limits.  No intervention is needed at this time.  Will repeat CT of the chest only in 3 months to assess for interval change.  If nodules resolve, patient can be transitioned to imaging once per year.  Patient will have video-assisted telemedicine visit 1 to 2 days after his imaging in 3 months to discuss the results.   2.  Cough: Patient does not complain  of this today.  Continue Tessalon Perles as needed. 3.  Dyspnea on exertion: Patient does not complain of this today.  I provided 20 minutes of face-to-face video visit time during this encounter which included chart review, counseling, and coordination of care as documented above.   Patient expressed understanding and was in agreement with this plan. He also understands that He can call clinic at any time with any questions, concerns, or complaints.    Lloyd Huger, MD   03/30/2021 11:43 AM

## 2021-03-25 ENCOUNTER — Other Ambulatory Visit: Payer: Self-pay

## 2021-03-25 ENCOUNTER — Ambulatory Visit
Admission: RE | Admit: 2021-03-25 | Discharge: 2021-03-25 | Disposition: A | Discharge status: Home / Self Care | Payer: Medicare HMO | Source: Ambulatory Visit | Attending: Oncology | Admitting: Oncology

## 2021-03-25 ENCOUNTER — Inpatient Hospital Stay: Payer: Medicare HMO | Attending: Oncology

## 2021-03-25 DIAGNOSIS — C20 Malignant neoplasm of rectum: Secondary | ICD-10-CM | POA: Diagnosis not present

## 2021-03-25 DIAGNOSIS — N200 Calculus of kidney: Secondary | ICD-10-CM | POA: Diagnosis not present

## 2021-03-25 DIAGNOSIS — I898 Other specified noninfective disorders of lymphatic vessels and lymph nodes: Secondary | ICD-10-CM | POA: Diagnosis not present

## 2021-03-25 DIAGNOSIS — Z85118 Personal history of other malignant neoplasm of bronchus and lung: Secondary | ICD-10-CM | POA: Diagnosis not present

## 2021-03-25 DIAGNOSIS — I700 Atherosclerosis of aorta: Secondary | ICD-10-CM | POA: Diagnosis not present

## 2021-03-25 DIAGNOSIS — K4020 Bilateral inguinal hernia, without obstruction or gangrene, not specified as recurrent: Secondary | ICD-10-CM | POA: Diagnosis not present

## 2021-03-25 DIAGNOSIS — K5730 Diverticulosis of large intestine without perforation or abscess without bleeding: Secondary | ICD-10-CM | POA: Diagnosis not present

## 2021-03-25 DIAGNOSIS — Z95828 Presence of other vascular implants and grafts: Secondary | ICD-10-CM

## 2021-03-25 DIAGNOSIS — K449 Diaphragmatic hernia without obstruction or gangrene: Secondary | ICD-10-CM | POA: Diagnosis not present

## 2021-03-25 LAB — CREATININE, SERUM
Creatinine, Ser: 0.77 mg/dL (ref 0.61–1.24)
GFR, Estimated: >60 mL/min (ref >60)

## 2021-03-25 MED ORDER — SODIUM CHLORIDE 0.9% FLUSH
10.0000 mL | Freq: Once | INTRAVENOUS | Status: DC
Start: 2021-03-25 — End: 2021-03-25
  Filled 2021-03-25: qty 10

## 2021-03-25 MED ORDER — HEPARIN SOD (PORK) LOCK FLUSH 100 UNIT/ML IV SOLN
500.0000 [IU] | Freq: Once | INTRAVENOUS | Status: AC
Start: 2021-03-25 — End: 2021-03-25
  Administered 2021-03-25: 500 [IU] via INTRAVENOUS
  Filled 2021-03-25: qty 5

## 2021-03-25 MED ORDER — IOHEXOL 350 MG/ML SOLN
100.0000 mL | Freq: Once | INTRAVENOUS | Status: AC | PRN
  Administered 2021-03-25: 100 mL via INTRAVENOUS

## 2021-03-26 LAB — CEA: CEA: 1.8 ng/mL (ref 0.0–4.7)

## 2021-03-30 ENCOUNTER — Inpatient Hospital Stay (HOSPITAL_BASED_OUTPATIENT_CLINIC_OR_DEPARTMENT_OTHER): Payer: Medicare HMO | Admitting: Oncology

## 2021-03-30 ENCOUNTER — Other Ambulatory Visit: Payer: Self-pay

## 2021-03-30 DIAGNOSIS — C20 Malignant neoplasm of rectum: Secondary | ICD-10-CM | POA: Diagnosis not present

## 2021-03-31 ENCOUNTER — Encounter: Payer: Self-pay | Admitting: Oncology

## 2021-03-31 ENCOUNTER — Telehealth: Payer: 59 | Admitting: Oncology

## 2021-04-04 DIAGNOSIS — Z933 Colostomy status: Secondary | ICD-10-CM | POA: Diagnosis not present

## 2021-04-06 NOTE — Progress Notes (Unsigned)
Name: [name] [name]   MRN: [id number]    DOB: [birth year]   Date:04/06/2021 ? ?     Progress Note ? ?Subjective ? ?Chief Complaint ? ?Follow Up ? ?HPI ? ?HTN: he does not check his bp at home, reviewed bp from the last 3 office visits at other physician's offices and bp either high or borderline. Discussed adding ACE or ARB to his regiment to keep bp around 130/80 but he declined. Advised him to monitor at home and let me know if lower when at home and he agreed. He denies chest pain or palpitation.  ? ?Patient Active Problem List  ? Diagnosis Date Noted  ? Metastatic cancer to lung [facility]) 08/11/2020  ? Essential hypertension 08/11/2020  ? Morbid obesity ([place]) 08/11/2020  ? Dyslipidemia 08/11/2020  ? Spasm of muscle of lower back 08/11/2020  ? Peyronie's disease 10/15/2019  ? Tobacco use 10/15/2019  ? Glaucoma of right eye 03/22/2018  ? Erectile dysfunction 03/22/2018  ? Class 2 severe obesity due to excess calories with serious comorbidity and body mass index (BMI) of 35.0 to 35.9 in adult [facility]) 03/22/2018  ? Decreased vision of right eye 09/21/2017  ? Colostomy in place [facility]) 09/21/2017  ? Hypogonadism in male 09/21/2017  ? Seasonal allergies   ? Degeneration of lumbar intervertebral disc 06/26/2017  ? Rectal cancer ([place]) 08/05/2014  ? ? ?Past Surgical History:  ?Procedure Laterality Date  ? BACK SURGERY    ? CHOLECYSTECTOMY    ? COLON SURGERY    ? ENDOBRONCHIAL ULTRASOUND N/A 07/01/2018  ? Procedure: ENDOBRONCHIAL ULTRASOUND;  Surgeon: Tyler Pita, MD;  Location: [facility];  Service: Cardiopulmonary;  Laterality: N/A;  ? FLEXIBLE BRONCHOSCOPY Left 11/20/2018  ? Procedure: FLEXIBLE BRONCHOSCOPY;  Surgeon: Tyler Pita, MD;  Location: [facility];  Service: Cardiopulmonary;  Laterality: Left;  ? INTERCOSTAL NERVE BLOCK Left 02/03/2019  ? Procedure: INTERCOSTAL NERVE BLOCK WITH EXPAREL;  Surgeon: Grace Isaac, MD;  Location: [place];  Service: Thoracic;  Laterality: Left;  ? KNEE SURGERY Bilateral   ?  LOBECTOMY Left 02/03/2019  ? Procedure: LEFT UPPER LOBECTOMY WITH LYMPH NODE DISSECTION;  Surgeon: Grace Isaac, MD;  Location: [place];  Service: Thoracic;  Laterality: Left;  ? PORT A CATH REVISION Left 08/05/2018  ? Procedure: PORT A CATH REVISION, REPOSITION LEFT;  Surgeon: Robert Bellow, MD;  Location: [facility];  Service: General;  Laterality: Left;  ? PORT-A-CATH REMOVAL Right 12/02/2014  ? Procedure: REMOVAL PORT-A-CATH;  Surgeon: Marlyce Huge, MD;  Location: [facility];  Service: General;  Laterality: Right;  ? PORTACATH PLACEMENT    ? PORTACATH PLACEMENT Left 07/29/2018  ? Procedure: INSERTION PORT-A-CATH LEFT;  Surgeon: Robert Bellow, MD;  Location: [facility];  Service: General;  Laterality: Left;  ? THORACOTOMY/LOBECTOMY Left 02/03/2019  ? Procedure: MINITHORACOTOMY;  Surgeon: Grace Isaac, MD;  Location: [place];  Service: Thoracic;  Laterality: Left;  ? VIDEO ASSISTED THORACOSCOPY (VATS)/WEDGE RESECTION Left 02/03/2019  ? Procedure: VIDEO ASSISTED THORACOSCOPY (VATS)/LUNG RESECTION;  Surgeon: Grace Isaac, MD;  Location: [place];  Service: Thoracic;  Laterality: Left;  ? VIDEO BRONCHOSCOPY N/A 02/03/2019  ? Procedure: VIDEO BRONCHOSCOPY;  Surgeon: Grace Isaac, MD;  Location: [place];  Service: Thoracic;  Laterality: N/A;  ? VIDEO BRONCHOSCOPY WITH ENDOBRONCHIAL ULTRASOUND N/A 12/30/2018  ? Procedure: VIDEO BRONCHOSCOPY WITH ENDOBRONCHIAL ULTRASOUND;  Surgeon: Grace Isaac, MD;  Location: [place];  Service: Thoracic;  Laterality: N/A;  ? ? ?Family History  ?  Problem Relation Age of Onset  ? Cancer Mother   ? Hypertension Mother   ? COPD Mother   ? COPD Father   ? Cancer Father   ?     Prostate CA, on Lupron  ? Stroke Maternal Grandmother   ? Cancer Paternal Grandmother   ? Heart attack Paternal Grandfather   ? Cancer Paternal Grandfather   ? ? ?Social History  ? ?Tobacco Use  ? Smoking status: Never  ? Smokeless tobacco: Current  ?  Types: Chew  ? Tobacco comments:  ?  might  smoke a cigar once every 3 months  ?Substance Use Topics  ? Alcohol use: Yes  ?  Alcohol/week: 0.0 standard drinks  ?  Comment: RARE  ? ? ? ?Current Outpatient Medications:  ?  albuterol (VENTOLIN HFA) 108 (90 Base) MCG/ACT inhaler, INHALE 2 PUFFS BY MOUTH INTO THE LUNGS EVERY 6 HOURS AS NEEDED FOR WHEEZING OR SHORTNESS OF BREATH, Disp: 18 g, Rfl: 1 ?  amLODipine (NORVASC) 10 MG tablet, TAKE 1 TABLET BY MOUTH EVERY DAY, Disp: 90 tablet, Rfl: 0 ?  aspirin-acetaminophen-caffeine (EXCEDRIN MIGRAINE) 250-250-65 MG tablet, Take 2 tablets by mouth every 6 (six) hours as needed for headache., Disp: , Rfl:  ?  aspirin-sod bicarb-citric acid (ALKA-SELTZER) 325 MG TBEF tablet, Take 650 mg by mouth every 6 (six) hours as needed (heartburn)., Disp: , Rfl:  ?  cetirizine (ZYRTEC) 10 MG tablet, Take 10 mg by mouth at bedtime. , Disp: , Rfl:  ?  cyclobenzaprine (FLEXERIL) 10 MG tablet, Take 1 tablet (10 mg total) by mouth 3 (three) times daily as needed for muscle spasms., Disp: 90 tablet, Rfl: 0 ?  loperamide (IMODIUM) 2 MG capsule, Take 4 mg by mouth as needed for diarrhea or loose stools. , Disp: , Rfl:  ?  Multiple Vitamin (MULTIVITAMIN WITH MINERALS) TABS tablet, Take 1 tablet by mouth every evening. , Disp: , Rfl:  ?  Naphazoline-Pheniramine (VISINE-A OP), Place 1 drop into both eyes daily as needed (allergies)., Disp: , Rfl:  ?  SYRINGE-NEEDLE, DISP, 3 ML (LUER LOCK SAFETY SYRINGES) 21G X 1-1/2" 3 ML MISC, Use as directed for testosterone administration, Disp: 50 each, Rfl: 0 ?  tadalafil (CIALIS) 20 MG tablet, TAKE 1 TAB BY MOUTH 1 HOUR PRIOR TO INTERCOURSE AS NEEDED, Disp: 30 tablet, Rfl: 3 ?  testosterone cypionate (DEPOTESTOSTERONE CYPIONATE) 200 MG/ML injection, INJECT 1 ML INTO THE MUSCLE EVERY 14 DAYS, Disp: 10 mL, Rfl: 0 ? ?Allergies  ?Allergen Reactions  ? Sulfa Antibiotics Hives  ? Morphine And Related Other (See Comments)  ?  Altered mental status  ? ? ?I personally reviewed active problem list, medication list,  allergies, family history, social history, health maintenance with the patient/caregiver today. ? ? ?ROS ? ?*** ? ?Objective ? ?There were no vitals filed for this visit. ? ?There is no height or weight on file to calculate BMI. ? ?Physical Exam ?*** ? ?Recent Results (from the past 2160 hour(s))  ?Creatinine, serum     Status: None  ? Collection Time: 03/25/21  9:53 AM  ?Result Value Ref Range  ? Creatinine, Ser 0.77 0.61 - 1.24 mg/dL  ? GFR, Estimated >60 >60 mL/min  ?  Comment: (NOTE) ?Calculated using the CKD-EPI Creatinine Equation (2021) ?Performed at [facility], [address] [place], [place] ?[address] ?  ?CEA     Status: None  ? Collection Time: 03/25/21  9:53 AM  ?Result Value Ref Range  ? CEA 1.8 0.0 -  4.7 ng/mL  ?  Comment: (NOTE) ?                            Nonsmokers          <3.9 ?                            Smokers             <5.6 ?Roche [employer]ostics Electrochemiluminescence Immunoassay ?(ECLIA) ?Values obtained with different assay methods or kits ?cannot be used interchangeably.  Results cannot be ?interpreted as absolute evidence of the presence or ?absence of malignant disease. ?Performed At: [place] ?[address] [place], [place] [id number] ?Rush Farmer MD HU:[id number] ?  ? ? ?PHQ2/9: ?Depression screen [facility] 2/9 02/22/2021 08/11/2020 10/15/2019 01/15/2019 12/25/2018  ?Decreased Interest 0 0 0 0 0  ?Down, Depressed, Hopeless 0 0 0 0 0  ?PHQ - 2 Score 0 0 0 0 0  ?Altered sleeping 0 - - - -  ?Tired, decreased energy 0 - - - -  ?Change in appetite 0 - - - -  ?Feeling bad or failure about yourself  0 - - - -  ?Trouble concentrating 0 - - - -  ?Moving slowly or fidgety/restless 0 - - - -  ?Suicidal thoughts 0 - - - -  ?PHQ-9 Score 0 - - - -  ?Difficult doing work/chores Not difficult at all - - - -  ?Some recent data might be hidden  ?  ?phq 9 is {gen pos neg:[id number]} ? ? ?Fall Risk: ?Fall Risk  02/22/2021 08/11/2020 10/15/2019 01/15/2019 12/25/2018  ?Falls in the past year? 0 0 0  0 0  ?Number falls in past yr: 0 0 0 - -  ?Injury with Fall? 0 0 0 - -  ?Risk for fall due to : No Fall Risks - - - -  ?Follow up Falls prevention discussed - Falls evaluation completed Falls evaluation comple

## 2021-04-07 ENCOUNTER — Encounter: Payer: Self-pay | Admitting: Family Medicine

## 2021-04-07 ENCOUNTER — Other Ambulatory Visit: Payer: Self-pay

## 2021-04-07 ENCOUNTER — Ambulatory Visit (INDEPENDENT_AMBULATORY_CARE_PROVIDER_SITE_OTHER): Payer: Medicare HMO | Admitting: Family Medicine

## 2021-04-07 VITALS — BP 140/84 | HR 94 | Resp 16 | Ht 75.0 in | Wt 265.0 lb

## 2021-04-07 DIAGNOSIS — Z933 Colostomy status: Secondary | ICD-10-CM | POA: Diagnosis not present

## 2021-04-07 DIAGNOSIS — Z902 Acquired absence of lung [part of]: Secondary | ICD-10-CM

## 2021-04-07 DIAGNOSIS — J302 Other seasonal allergic rhinitis: Secondary | ICD-10-CM

## 2021-04-07 DIAGNOSIS — E785 Hyperlipidemia, unspecified: Secondary | ICD-10-CM

## 2021-04-07 DIAGNOSIS — J3089 Other allergic rhinitis: Secondary | ICD-10-CM | POA: Diagnosis not present

## 2021-04-07 DIAGNOSIS — I10 Essential (primary) hypertension: Secondary | ICD-10-CM | POA: Diagnosis not present

## 2021-04-07 DIAGNOSIS — E669 Obesity, unspecified: Secondary | ICD-10-CM | POA: Diagnosis not present

## 2021-04-07 DIAGNOSIS — E66811 Obesity, class 1: Secondary | ICD-10-CM

## 2021-04-07 DIAGNOSIS — E291 Testicular hypofunction: Secondary | ICD-10-CM | POA: Diagnosis not present

## 2021-04-07 DIAGNOSIS — R718 Other abnormality of red blood cells: Secondary | ICD-10-CM

## 2021-04-07 DIAGNOSIS — Z85048 Personal history of other malignant neoplasm of rectum, rectosigmoid junction, and anus: Secondary | ICD-10-CM | POA: Diagnosis not present

## 2021-04-07 MED ORDER — AMLODIPINE BESYLATE 10 MG PO TABS: 10.0000 mg | ORAL_TABLET | Freq: Every day | ORAL | 1 refills | Status: DC

## 2021-04-07 NOTE — Progress Notes (Signed)
Name: [name] [name]   MRN: [id number]    DOB: [birth year]   Date:04/07/2021 ? ?     Progress Note ? ?Subjective ? ?Chief Complaint ? ?Follow Up ? ?HPI ? ?HTN: he does not check his bp at home, reviewed bp from the last 3 office visits at other physician's offices and bp either high or borderline. Discussed adding ACE or ARB to his regiment again today He denies chest pain, palpitation , but has intermittent SOB - s/p lobectomy left lung but stable ? ?Hypogonadism: under the care of Dr. Bernardo Heater, last PSA was done 05/22 takes injectable testosterone , ? ?Dyslipidemia: he needs to have repeat labs ? ?Rectal cancer: with mets to left lung and status post lobectomy, doing well, CEA normal, recent CT showed some nodules but he had bronchitis at the time and will have a repeat soon. He has a colostomy and denies any problems.  ? ?AR: doing well on otc medication, ? ?Patient Active Problem List  ? Diagnosis Date Noted  ? History of lobectomy of lung 04/07/2021  ? Metastatic cancer to lung [facility]) 08/11/2020  ? Essential hypertension 08/11/2020  ? Dyslipidemia 08/11/2020  ? Spasm of muscle of lower back 08/11/2020  ? Peyronie's disease 10/15/2019  ? Tobacco use 10/15/2019  ? Glaucoma of right eye 03/22/2018  ? Erectile dysfunction 03/22/2018  ? Decreased vision of right eye 09/21/2017  ? Colostomy in place [facility]) 09/21/2017  ? Hypogonadism in male 09/21/2017  ? Perennial allergic rhinitis with seasonal variation   ? Degeneration of lumbar intervertebral disc 06/26/2017  ? Rectal cancer ([place]) 08/05/2014  ? ? ?Past Surgical History:  ?Procedure Laterality Date  ? BACK SURGERY    ? CHOLECYSTECTOMY    ? COLON SURGERY    ? ENDOBRONCHIAL ULTRASOUND N/A 07/01/2018  ? Procedure: ENDOBRONCHIAL ULTRASOUND;  Surgeon: Tyler Pita, MD;  Location: [facility];  Service: Cardiopulmonary;  Laterality: N/A;  ? FLEXIBLE BRONCHOSCOPY Left 11/20/2018  ? Procedure: FLEXIBLE BRONCHOSCOPY;  Surgeon: Tyler Pita, MD;  Location: [facility];   Service: Cardiopulmonary;  Laterality: Left;  ? INTERCOSTAL NERVE BLOCK Left 02/03/2019  ? Procedure: INTERCOSTAL NERVE BLOCK WITH EXPAREL;  Surgeon: Grace Isaac, MD;  Location: [place];  Service: Thoracic;  Laterality: Left;  ? KNEE SURGERY Bilateral   ? LOBECTOMY Left 02/03/2019  ? Procedure: LEFT UPPER LOBECTOMY WITH LYMPH NODE DISSECTION;  Surgeon: Grace Isaac, MD;  Location: [place];  Service: Thoracic;  Laterality: Left;  ? PORT A CATH REVISION Left 08/05/2018  ? Procedure: PORT A CATH REVISION, REPOSITION LEFT;  Surgeon: Robert Bellow, MD;  Location: [facility];  Service: General;  Laterality: Left;  ? PORT-A-CATH REMOVAL Right 12/02/2014  ? Procedure: REMOVAL PORT-A-CATH;  Surgeon: Marlyce Huge, MD;  Location: [facility];  Service: General;  Laterality: Right;  ? PORTACATH PLACEMENT    ? PORTACATH PLACEMENT Left 07/29/2018  ? Procedure: INSERTION PORT-A-CATH LEFT;  Surgeon: Robert Bellow, MD;  Location: [facility];  Service: General;  Laterality: Left;  ? THORACOTOMY/LOBECTOMY Left 02/03/2019  ? Procedure: MINITHORACOTOMY;  Surgeon: Grace Isaac, MD;  Location: [place];  Service: Thoracic;  Laterality: Left;  ? VIDEO ASSISTED THORACOSCOPY (VATS)/WEDGE RESECTION Left 02/03/2019  ? Procedure: VIDEO ASSISTED THORACOSCOPY (VATS)/LUNG RESECTION;  Surgeon: Grace Isaac, MD;  Location: [place];  Service: Thoracic;  Laterality: Left;  ? VIDEO BRONCHOSCOPY N/A 02/03/2019  ? Procedure: VIDEO BRONCHOSCOPY;  Surgeon: Grace Isaac, MD;  Location: [place];  Service: Thoracic;  Laterality: N/A;  ? VIDEO BRONCHOSCOPY WITH ENDOBRONCHIAL ULTRASOUND N/A 12/30/2018  ? Procedure: VIDEO BRONCHOSCOPY WITH ENDOBRONCHIAL ULTRASOUND;  Surgeon: Grace Isaac, MD;  Location: [place];  Service: Thoracic;  Laterality: N/A;  ? ? ?Family History  ?Problem Relation Age of Onset  ? Cancer Mother   ? Hypertension Mother   ? COPD Mother   ? COPD Father   ? Cancer Father   ?     Prostate CA, on Lupron  ? Stroke  Maternal Grandmother   ? Cancer Paternal Grandmother   ? Heart attack Paternal Grandfather   ? Cancer Paternal Grandfather   ? ? ?Social History  ? ?Tobacco Use  ? Smoking status: Never  ? Smokeless tobacco: Current  ?  Types: Chew  ? Tobacco comments:  ?  might smoke a cigar once every 3 months  ?Substance Use Topics  ? Alcohol use: Yes  ?  Alcohol/week: 0.0 standard drinks  ?  Comment: RARE  ? ? ? ?Current Outpatient Medications:  ?  albuterol (VENTOLIN HFA) 108 (90 Base) MCG/ACT inhaler, INHALE 2 PUFFS BY MOUTH INTO THE LUNGS EVERY 6 HOURS AS NEEDED FOR WHEEZING OR SHORTNESS OF BREATH, Disp: 18 g, Rfl: 1 ?  cetirizine (ZYRTEC) 10 MG tablet, Take 10 mg by mouth at bedtime. , Disp: , Rfl:  ?  cyclobenzaprine (FLEXERIL) 10 MG tablet, Take 1 tablet (10 mg total) by mouth 3 (three) times daily as needed for muscle spasms., Disp: 90 tablet, Rfl: 0 ?  loperamide (IMODIUM) 2 MG capsule, Take 4 mg by mouth as needed for diarrhea or loose stools. , Disp: , Rfl:  ?  Multiple Vitamin (MULTIVITAMIN WITH MINERALS) TABS tablet, Take 1 tablet by mouth every evening. , Disp: , Rfl:  ?  Naphazoline-Pheniramine (VISINE-A OP), Place 1 drop into both eyes daily as needed (allergies)., Disp: , Rfl:  ?  SYRINGE-NEEDLE, DISP, 3 ML (LUER LOCK SAFETY SYRINGES) 21G X 1-1/2" 3 ML MISC, Use as directed for testosterone administration, Disp: 50 each, Rfl: 0 ?  tadalafil (CIALIS) 20 MG tablet, TAKE 1 TAB BY MOUTH 1 HOUR PRIOR TO INTERCOURSE AS NEEDED, Disp: 30 tablet, Rfl: 3 ?  amLODipine (NORVASC) 10 MG tablet, Take 1 tablet (10 mg total) by mouth daily., Disp: 90 tablet, Rfl: 1 ?  testosterone cypionate (DEPOTESTOSTERONE CYPIONATE) 200 MG/ML injection, INJECT 1 ML INTO THE MUSCLE EVERY 14 DAYS, Disp: 10 mL, Rfl: 0 ? ?Allergies  ?Allergen Reactions  ? Sulfa Antibiotics Hives  ? Morphine And Related Other (See Comments)  ?  Altered mental status  ? ? ?I personally reviewed active problem list, medication list, allergies, family history, social  history, health maintenance with the patient/caregiver today. ? ? ?ROS ? ?Constitutional: Negative for fever or weight change.  ?Respiratory: Negative for cough and shortness of breath.   ?Cardiovascular: Negative for chest pain or palpitations.  ?Gastrointestinal: Negative for abdominal pain, no bowel changes.  ?Musculoskeletal: Negative for gait problem or joint swelling.  ?Skin: Negative for rash.  ?Neurological: Negative for dizziness or headache.  ?No other specific complaints in a complete review of systems (except as listed in HPI above).  ? ?Objective ? ?Vitals:  ? 04/07/21 0906  ?BP: 140/84  ?Pulse: 94  ?Resp: 16  ?SpO2: 99%  ?Weight: 265 lb (120.2 kg)  ?Height: _0  (1.905 m)  ? ? ?Body mass index is 33.12 kg/m?. ? ?Physical Exam ? ?Constitutional: Patient appears well-developed and well-nourished. Obese  No distress.  ?HEENT: head atraumatic, normocephalic,  pupils equal and reactive to light, neck supple, throat within normal limits ?Cardiovascular: Normal rate, regular rhythm and normal heart sounds.  No murmur heard. No BLE edema. ?Pulmonary/Chest: Effort normal and breath sounds normal. No respiratory distress. ?Abdominal: Soft.  There is no tenderness. ?Psychiatric: Patient has a normal mood and affect. behavior is normal. Judgment and thought content normal.  ? ?Recent Results (from the past 2160 hour(s))  ?Creatinine, serum     Status: None  ? Collection Time: 03/25/21  9:53 AM  ?Result Value Ref Range  ? Creatinine, Ser 0.77 0.61 - 1.24 mg/dL  ? GFR, Estimated >60 >60 mL/min  ?  Comment: (NOTE) ?Calculated using the CKD-EPI Creatinine Equation (2021) ?Performed at [facility], [address], [place], [place] ?[address] ?  ?CEA     Status: None  ? Collection Time: 03/25/21  9:53 AM  ?Result Value Ref Range  ? CEA 1.8 0.0 - 4.7 ng/mL  ?  Comment: (NOTE) ?                            Nonsmokers          <3.9 ?                            Smokers             <5.6 ?[employer]  Electrochemiluminescence Immunoassay ?(ECLIA) ?Values obtained with different assay methods or kits ?cannot be used interchangeably.  Results cannot be ?interpreted as absolute evidence of the presence or ?absence of malig

## 2021-04-28 ENCOUNTER — Other Ambulatory Visit: Payer: Self-pay

## 2021-04-28 ENCOUNTER — Encounter: Payer: Self-pay | Admitting: Family Medicine

## 2021-04-28 DIAGNOSIS — I10 Essential (primary) hypertension: Secondary | ICD-10-CM

## 2021-04-29 MED ORDER — AMLODIPINE BESYLATE 10 MG PO TABS: 10.0000 mg | ORAL_TABLET | Freq: Every day | ORAL | 1 refills | Status: DC

## 2021-05-05 DIAGNOSIS — Z933 Colostomy status: Secondary | ICD-10-CM | POA: Diagnosis not present

## 2021-05-10 DIAGNOSIS — J209 Acute bronchitis, unspecified: Secondary | ICD-10-CM | POA: Diagnosis not present

## 2021-05-10 DIAGNOSIS — Z20822 Contact with and (suspected) exposure to covid-19: Secondary | ICD-10-CM | POA: Diagnosis not present

## 2021-05-10 DIAGNOSIS — Z03818 Encounter for observation for suspected exposure to other biological agents ruled out: Secondary | ICD-10-CM | POA: Diagnosis not present

## 2021-05-10 DIAGNOSIS — J018 Other acute sinusitis: Secondary | ICD-10-CM | POA: Diagnosis not present

## 2021-06-07 DIAGNOSIS — Z933 Colostomy status: Secondary | ICD-10-CM | POA: Diagnosis not present

## 2021-06-11 ENCOUNTER — Encounter: Payer: Self-pay | Admitting: Oncology

## 2021-06-11 ENCOUNTER — Encounter: Payer: Self-pay | Admitting: Urology

## 2021-06-14 ENCOUNTER — Telehealth: Payer: Self-pay

## 2021-06-14 ENCOUNTER — Other Ambulatory Visit: Payer: Self-pay

## 2021-06-14 DIAGNOSIS — C20 Malignant neoplasm of rectum: Secondary | ICD-10-CM

## 2021-06-14 NOTE — Telephone Encounter (Signed)
Patient sent my chart message requesting that labs from Dr. Bernardo Heater be added to his labs to 6/8 for Dr. Grayland Ormond to minimize sticks. Dr. Dene Gentry nurse states he needs testosterone, PSA, and HCT added. Orders added for 6/8.

## 2021-06-14 NOTE — Telephone Encounter (Signed)
Called Dr. Bernardo Heater to get labs needed from him to add on to patient next lab appointment with Woodfin Ganja on 6/8

## 2021-06-22 ENCOUNTER — Ambulatory Visit: Payer: Self-pay | Admitting: Urology

## 2021-06-30 ENCOUNTER — Ambulatory Visit
Admission: RE | Admit: 2021-06-30 | Discharge: 2021-06-30 | Disposition: A | Discharge status: Home / Self Care | Payer: Medicare HMO | Source: Ambulatory Visit | Attending: Oncology | Admitting: Oncology

## 2021-06-30 ENCOUNTER — Inpatient Hospital Stay: Payer: Medicare HMO | Attending: Oncology

## 2021-06-30 DIAGNOSIS — R0609 Other forms of dyspnea: Secondary | ICD-10-CM | POA: Insufficient documentation

## 2021-06-30 DIAGNOSIS — Z902 Acquired absence of lung [part of]: Secondary | ICD-10-CM | POA: Insufficient documentation

## 2021-06-30 DIAGNOSIS — C20 Malignant neoplasm of rectum: Secondary | ICD-10-CM | POA: Diagnosis not present

## 2021-06-30 DIAGNOSIS — R972 Elevated prostate specific antigen [PSA]: Secondary | ICD-10-CM | POA: Diagnosis not present

## 2021-06-30 DIAGNOSIS — Z933 Colostomy status: Secondary | ICD-10-CM | POA: Diagnosis not present

## 2021-06-30 DIAGNOSIS — Z85048 Personal history of other malignant neoplasm of rectum, rectosigmoid junction, and anus: Secondary | ICD-10-CM | POA: Diagnosis not present

## 2021-06-30 DIAGNOSIS — R059 Cough, unspecified: Secondary | ICD-10-CM | POA: Diagnosis not present

## 2021-06-30 DIAGNOSIS — R0602 Shortness of breath: Secondary | ICD-10-CM | POA: Insufficient documentation

## 2021-06-30 DIAGNOSIS — F17290 Nicotine dependence, other tobacco product, uncomplicated: Secondary | ICD-10-CM | POA: Diagnosis not present

## 2021-06-30 DIAGNOSIS — R918 Other nonspecific abnormal finding of lung field: Secondary | ICD-10-CM | POA: Diagnosis not present

## 2021-06-30 DIAGNOSIS — I700 Atherosclerosis of aorta: Secondary | ICD-10-CM | POA: Diagnosis not present

## 2021-06-30 DIAGNOSIS — R69 Illness, unspecified: Secondary | ICD-10-CM | POA: Diagnosis not present

## 2021-06-30 LAB — COMPREHENSIVE METABOLIC PANEL
ALT: 12 U/L (ref 0–44)
AST: 21 U/L (ref 15–41)
Albumin: 4.2 g/dL (ref 3.5–5.0)
Alkaline Phosphatase: 65 U/L (ref 38–126)
Anion gap: 8 (ref 5–15)
BUN: 15 mg/dL (ref 8–23)
CO2: 25 mmol/L (ref 22–32)
Calcium: 8.8 mg/dL — ABNORMAL LOW (ref 8.9–10.3)
Chloride: 106 mmol/L (ref 98–111)
Creatinine, Ser: 0.73 mg/dL (ref 0.61–1.24)
GFR, Estimated: >60 mL/min (ref >60)
Glucose, Bld: 106 mg/dL — ABNORMAL HIGH (ref 70–99)
Potassium: 3.7 mmol/L (ref 3.5–5.1)
Sodium: 139 mmol/L (ref 135–145)
Total Bilirubin: 0.7 mg/dL (ref 0.3–1.2)
Total Protein: 7.4 g/dL (ref 6.5–8.1)

## 2021-06-30 LAB — CBC WITH DIFFERENTIAL/PLATELET
Abs Immature Granulocytes: 0.03 10*3/uL (ref 0.00–0.07)
Basophils Absolute: 0.1 10*3/uL (ref 0.0–0.1)
Basophils Relative: 1 %
Eosinophils Absolute: 0.1 10*3/uL (ref 0.0–0.5)
Eosinophils Relative: 2 %
HCT: 41.7 % (ref 39.0–52.0)
Hemoglobin: 14 g/dL (ref 13.0–17.0)
Immature Granulocytes: 1 %
Lymphocytes Relative: 21 %
Lymphs Abs: 1 10*3/uL (ref 0.7–4.0)
MCH: 29.7 pg (ref 26.0–34.0)
MCHC: 33.6 g/dL (ref 30.0–36.0)
MCV: 88.3 fL (ref 80.0–100.0)
Monocytes Absolute: 0.4 10*3/uL (ref 0.1–1.0)
Monocytes Relative: 9 %
Neutro Abs: 3 10*3/uL (ref 1.7–7.7)
Neutrophils Relative %: 66 %
Platelets: 327 10*3/uL (ref 150–400)
RBC: 4.72 MIL/uL (ref 4.22–5.81)
RDW: 13.9 % (ref 11.5–15.5)
WBC: 4.6 10*3/uL (ref 4.0–10.5)
nRBC: 0 % (ref 0.0–0.2)

## 2021-06-30 LAB — PSA: Prostatic Specific Antigen: 2.4 ng/mL (ref 0.00–4.00)

## 2021-06-30 MED ORDER — HEPARIN SOD (PORK) LOCK FLUSH 100 UNIT/ML IV SOLN
500.0000 [IU] | Freq: Once | INTRAVENOUS | Status: AC
Start: 2021-06-30 — End: ?
  Filled 2021-06-30: qty 5

## 2021-06-30 MED ORDER — SODIUM CHLORIDE 0.9% FLUSH
10.0000 mL | Freq: Once | INTRAVENOUS | Status: AC
Start: 2021-06-30 — End: ?
  Filled 2021-06-30: qty 10

## 2021-06-30 MED ORDER — IOHEXOL 300 MG/ML  SOLN
75.0000 mL | Freq: Once | INTRAMUSCULAR | Status: AC | PRN
  Administered 2021-06-30: 75 mL via INTRAVENOUS

## 2021-06-30 MED ORDER — HEPARIN SOD (PORK) LOCK FLUSH 100 UNIT/ML IV SOLN
500.0000 [IU] | Freq: Once | INTRAVENOUS | Status: AC
  Administered 2021-06-30: 500 [IU] via INTRAVENOUS

## 2021-07-01 LAB — TESTOSTERONE: Testosterone: 187 ng/dL — ABNORMAL LOW (ref 264–916)

## 2021-07-01 LAB — CEA: CEA: 0.8 ng/mL (ref 0.0–4.7)

## 2021-07-05 ENCOUNTER — Inpatient Hospital Stay (HOSPITAL_BASED_OUTPATIENT_CLINIC_OR_DEPARTMENT_OTHER): Payer: Medicare HMO | Admitting: Oncology

## 2021-07-05 ENCOUNTER — Encounter: Payer: Self-pay | Admitting: Oncology

## 2021-07-05 DIAGNOSIS — R0602 Shortness of breath: Secondary | ICD-10-CM | POA: Diagnosis not present

## 2021-07-05 DIAGNOSIS — C20 Malignant neoplasm of rectum: Secondary | ICD-10-CM

## 2021-07-05 DIAGNOSIS — R059 Cough, unspecified: Secondary | ICD-10-CM

## 2021-07-05 NOTE — Progress Notes (Signed)
[facility]  [phone] Fax:(336) [phone]  ID: [name] [name] OB: 04/29/1955  MR#: [id number]  TAV#:[id number]  Patient Care Team: [place] as PCP - General (Family Medicine) Marlyce Huge, MD as Attending Physician (Surgery) Lloyd Huger, MD as Consulting Physician (Oncology) Abbie Sons, MD (Urology) Eulogio Bear, MD as Consulting Physician (Ophthalmology) Flora Lipps, MD as Consulting Physician (Pulmonary Disease)   I connected with [name] [name] on 07/05/21 at  3:00 PM EDT by video enabled telemedicine visit and verified that I am speaking with the correct person using two identifiers.   I discussed the limitations, risks, security and privacy concerns of performing an evaluation and management service by telemedicine and the availability of in-person appointments. I also discussed with the patient that there may be a patient responsible charge related to this service. The patient expressed understanding and agreed to proceed.   Other persons participating in the visit and their role in the encounter: Patient, MD.  Patient's location: Home. Provider's location: Clinic.  CHIEF COMPLAINT: Recurrent, stage IV rectal adenocarcinoma.  INTERVAL HISTORY: Patient agreed to video assisted telemedicine visit for further evaluation and discussion of his chest CT results.  He continues to have occasional cough and shortness of breath, but otherwise feels well.  He currently feels well and is asymptomatic.  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  He has no neurologic complaints.  He denies any weakness or fatigue. He does not complain of peripheral neuropathy today.  He denies any recent fevers or illnesses.  He denies any chest pain or hemoptysis.  He denies nausea, vomiting, constipation, or diarrhea. He has noted no blood or melena in his colostomy bag.  He has no urinary  complaints.  Patient offers no further specific complaints today.  REVIEW OF SYSTEMS:   Review of Systems  Constitutional:  Negative for fever, malaise/fatigue and weight loss.  Respiratory:  Positive for cough and shortness of breath.   Cardiovascular: Negative.  Negative for chest pain and leg swelling.  Gastrointestinal: Negative.  Negative for abdominal pain, blood in stool, constipation, diarrhea and melena.  Genitourinary: Negative.  Negative for dysuria.  Musculoskeletal: Negative.  Negative for back pain.  Skin: Negative.  Negative for rash.  Neurological: Negative.  Negative for tingling, sensory change, focal weakness, weakness and headaches.  Psychiatric/Behavioral: Negative.  The patient is not nervous/anxious and does not have insomnia.     As per HPI. Otherwise, a complete review of systems is negative.  PAST MEDICAL HISTORY: Past Medical History:  Diagnosis Date   Anemia    Cancer ([place])    rectal 2015, followed by Oncology, permanent colostomy   Colostomy in place [facility])    Dyspnea    on exertion   History of kidney stones    Pneumonia    Seasonal allergies     PAST SURGICAL HISTORY: Past Surgical History:  Procedure Laterality Date   BACK SURGERY     CHOLECYSTECTOMY     COLON SURGERY     ENDOBRONCHIAL ULTRASOUND N/A 07/01/2018   Procedure: ENDOBRONCHIAL ULTRASOUND;  Surgeon: Tyler Pita, MD;  Location: [facility];  Service: Cardiopulmonary;  Laterality: N/A;   FLEXIBLE BRONCHOSCOPY Left 11/20/2018   Procedure: FLEXIBLE BRONCHOSCOPY;  Surgeon: Tyler Pita, MD;  Location: [facility];  Service: Cardiopulmonary;  Laterality: Left;   INTERCOSTAL NERVE BLOCK Left 02/03/2019   Procedure: INTERCOSTAL NERVE BLOCK WITH EXPAREL;  Surgeon: Lanelle Bal  B, MD;  Location: [place];  Service: Thoracic;  Laterality: Left;   KNEE SURGERY Bilateral    LOBECTOMY Left 02/03/2019   Procedure: LEFT UPPER LOBECTOMY WITH LYMPH NODE DISSECTION;  Surgeon: Grace Isaac,  MD;  Location: [place];  Service: Thoracic;  Laterality: Left;   PORT A CATH REVISION Left 08/05/2018   Procedure: PORT A CATH REVISION, REPOSITION LEFT;  Surgeon: Robert Bellow, MD;  Location: [facility];  Service: General;  Laterality: Left;   PORT-A-CATH REMOVAL Right 12/02/2014   Procedure: REMOVAL PORT-A-CATH;  Surgeon: Marlyce Huge, MD;  Location: [facility];  Service: General;  Laterality: Right;   PORTACATH PLACEMENT     PORTACATH PLACEMENT Left 07/29/2018   Procedure: INSERTION PORT-A-CATH LEFT;  Surgeon: Robert Bellow, MD;  Location: [facility];  Service: General;  Laterality: Left;   THORACOTOMY/LOBECTOMY Left 02/03/2019   Procedure: MINITHORACOTOMY;  Surgeon: Grace Isaac, MD;  Location: [place];  Service: Thoracic;  Laterality: Left;   VIDEO ASSISTED THORACOSCOPY (VATS)/WEDGE RESECTION Left 02/03/2019   Procedure: VIDEO ASSISTED THORACOSCOPY (VATS)/LUNG RESECTION;  Surgeon: Grace Isaac, MD;  Location: [place];  Service: Thoracic;  Laterality: Left;   VIDEO BRONCHOSCOPY N/A 02/03/2019   Procedure: VIDEO BRONCHOSCOPY;  Surgeon: Grace Isaac, MD;  Location: [place];  Service: Thoracic;  Laterality: N/A;   VIDEO BRONCHOSCOPY WITH ENDOBRONCHIAL ULTRASOUND N/A 12/30/2018   Procedure: VIDEO BRONCHOSCOPY WITH ENDOBRONCHIAL ULTRASOUND;  Surgeon: Grace Isaac, MD;  Location: [place];  Service: Thoracic;  Laterality: N/A;    FAMILY HISTORY: Father with prostate cancer.  COPD, CHF.     ADVANCED DIRECTIVES:    HEALTH MAINTENANCE: Social History   Tobacco Use   Smoking status: Never   Smokeless tobacco: Current    Types: Chew   Tobacco comments:    might smoke a cigar once every 3 months  Vaping Use   Vaping Use: Never used  Substance Use Topics   Alcohol use: Yes    Alcohol/week: 0.0 standard drinks of alcohol    Comment: RARE   Drug use: No     Colonoscopy:  PAP:  Bone density:  Lipid panel:  Allergies  Allergen Reactions   Sulfa Antibiotics Hives    Morphine And Related Other (See Comments)    Altered mental status    Current Outpatient Medications  Medication Sig Dispense Refill   albuterol (VENTOLIN HFA) 108 (90 Base) MCG/ACT inhaler INHALE 2 PUFFS BY MOUTH INTO THE LUNGS EVERY 6 HOURS AS NEEDED FOR WHEEZING OR SHORTNESS OF BREATH 18 g 1   amLODipine (NORVASC) 10 MG tablet Take 1 tablet (10 mg total) by mouth daily. 90 tablet 1   cetirizine (ZYRTEC) 10 MG tablet Take 10 mg by mouth at bedtime.      cyclobenzaprine (FLEXERIL) 10 MG tablet Take 1 tablet (10 mg total) by mouth 3 (three) times daily as needed for muscle spasms. 90 tablet 0   loperamide (IMODIUM) 2 MG capsule Take 4 mg by mouth as needed for diarrhea or loose stools.      Multiple Vitamin (MULTIVITAMIN WITH MINERALS) TABS tablet Take 1 tablet by mouth every evening.      Naphazoline-Pheniramine (VISINE-A OP) Place 1 drop into both eyes daily as needed (allergies).     SYMBICORT 160-4.5 MCG/ACT inhaler Inhale 2 puffs into the lungs 2 (two) times daily.     SYRINGE-NEEDLE, DISP, 3 ML (LUER LOCK SAFETY SYRINGES) 21G X 1-1/2" 3 ML MISC Use as directed for testosterone administration 50  each 0   tadalafil (CIALIS) 20 MG tablet TAKE 1 TAB BY MOUTH 1 HOUR PRIOR TO INTERCOURSE AS NEEDED 30 tablet 3   testosterone cypionate (DEPOTESTOSTERONE CYPIONATE) 200 MG/ML injection INJECT 1 ML INTO THE MUSCLE EVERY 14 DAYS (Patient not taking: Reported on 07/05/2021) 10 mL 0   No current facility-administered medications for this visit.   Facility-Administered Medications Ordered in Other Visits  Medication Dose Route Frequency Provider Last Rate Last Admin   heparin lock flush 100 unit/mL  500 Units Intravenous Once Lloyd Huger, MD       sodium chloride flush (NS) 0.9 % injection 10 mL  10 mL Intravenous Once Lloyd Huger, MD        OBJECTIVE: There were no vitals filed for this visit.   There is no height or weight on file to calculate BMI.    ECOG FS:0 -  Asymptomatic  General: Well-developed, well-nourished, no acute distress. HEENT: Normocephalic. Neuro: Alert, answering all questions appropriately. Cranial nerves grossly intact. Psych: Normal affect.  LAB RESULTS:  Lab Results  Component Value Date   NA 139 06/30/2021   K 3.7 06/30/2021   CL 106 06/30/2021   CO2 25 06/30/2021   GLUCOSE 106 (H) 06/30/2021   BUN 15 06/30/2021   CREATININE 0.73 06/30/2021   CALCIUM 8.8 (L) 06/30/2021   PROT 7.4 06/30/2021   ALBUMIN 4.2 06/30/2021   AST 21 06/30/2021   ALT 12 06/30/2021   ALKPHOS 65 06/30/2021   BILITOT 0.7 06/30/2021   GFRNONAA >60 06/30/2021   GFRAA >60 09/16/2019    Lab Results  Component Value Date   WBC 4.6 06/30/2021   NEUTROABS 3.0 06/30/2021   HGB 14.0 06/30/2021   HCT 41.7 06/30/2021   MCV 88.3 06/30/2021   PLT 327 06/30/2021     STUDIES: CT CHEST W CONTRAST  Result Date: 07/01/2021 CLINICAL DATA:  History of lung cancer, status post left upper lobectomy, rectal cancer, status post partial colectomy, follow-up pulmonary nodules * Tracking Code: BO * EXAM: CT CHEST WITH CONTRAST TECHNIQUE: Multidetector CT imaging of the chest was performed during intravenous contrast administration. RADIATION DOSE REDUCTION: This exam was performed according to the departmental dose-optimization program which includes automated exposure control, adjustment of the mA and/or kV according to patient size and/or use of iterative reconstruction technique. CONTRAST:  66m OMNIPAQUE IOHEXOL 300 MG/ML  SOLN COMPARISON:  CT chest abdomen pelvis, 03/25/2021 FINDINGS: Cardiovascular: Left chest port catheter. Aortic atherosclerosis. Normal heart size. Scattered three-vessel coronary artery calcifications. No pericardial effusion. Mediastinum/Nodes: No enlarged mediastinal, hilar, or axillary lymph nodes. Thyroid gland, trachea, and esophagus demonstrate no significant findings. Lungs/Pleura: Status post left upper lobectomy. Nodularity in the  dependent right lung base and left lung bases stable or diminished, predominantly featuring irregular bandlike scarring and or atelectasis in the dependent right lower lobe (series 3, image 141). Small nodules in the left lung base are not significantly changed, and most appear faintly calcified, including a subpleural nodule measuring 0.5 cm (series 3, image 122). No pleural effusion or pneumothorax. Upper Abdomen: No acute abnormality.  Status post cholecystectomy. Musculoskeletal: No chest wall abnormality. No suspicious osseous lesions identified. IMPRESSION: 1. Nodularity in the lung bases is stable or diminished, with residual irregular bandlike scarring and or atelectasis in the dependent right lower lobe and unchanged small nodules in the left lung base, the majority of which appear faintly calcified. These are almost certainly benign sequelae of prior infection or inflammation, without specific findings to suggest metastatic  disease. No new nodules. 2. Status post left upper lobectomy. 3. Coronary artery disease. Aortic Atherosclerosis (ICD10-I70.0). Electronically Signed   By: Delanna Ahmadi M.D.   On: 07/01/2021 16:12    ONCOLOGY HISTORY:  Patient was initially considered a clinical stage IIIa, but after completing neoadjuvant 5-FU and XRT followed by surgical excision on October 08, 2013 he was noted to be pathologic stage Ia.  He declined adjuvant FOLFOX at that time.  More recently, patient's CEA started trending up and CT of the chest revealed a large 7.7 cm left upper lobe mass biopsy consistent with rectal adenocarcinoma.  Patient completed cycle 6 of FOLFOX plus Avastin on October 16, 2018.  Repeat biopsy did not reveal any evidence of malignancy, but persistent fungal infection.  Patient underwent repeat bronchoscopy on December 30, 2018 which did in fact reveal residual malignancy.  He subsequently underwent surgical resection on February 03, 2019 with complete removal of residual disease.   One lymph node had a "fragment" of malignancy.  Patient did not require additional adjuvant chemotherapy or XRT.   ASSESSMENT: Recurrent, stage IV rectal adenocarcinoma.  PLAN:    1.  Recurrent, stage IV rectal adenocarcinoma: See oncology history as above.  No evidence of disease.  Patient's most recent CT scan of chest, abdomen, and pelvis on March 25, 2021 reviewed independently with no obvious evidence of recurrent or progressive disease, but did note new "clustered nodularity" in the right middle lobe possibly infectious or aspiration related.  Repeat chest CT on June 30, 2021 reported nodularity resolved or resolving and no evidence of recurrent or metastatic disease.  CEA is 0.8.  No intervention is needed at this time.  Continue full body imaging once per year with his next scheduled CT scan in March 2024.  Patient will return to clinic in 4 months with laboratory work and video assisted telemedicine visit.   2.  Cough/shortness of breath: Intermittent.  Patient was given a referral back to pulmonology for further evaluation. 3.  Dyspnea on exertion: Referral back to pulmonology as above.   I provided 20 minutes of face-to-face video visit time during this encounter which included chart review, counseling, and coordination of care as documented above.   Patient expressed understanding and was in agreement with this plan. He also understands that He can call clinic at any time with any questions, concerns, or complaints.    Lloyd Huger, MD   07/05/2021 3:08 PM

## 2021-07-05 NOTE — Addendum Note (Signed)
Addended by: Delice Bison E on: 07/05/2021 03:55 PM   Modules accepted: Orders

## 2021-07-08 DIAGNOSIS — Z933 Colostomy status: Secondary | ICD-10-CM | POA: Diagnosis not present

## 2021-07-12 ENCOUNTER — Other Ambulatory Visit: Payer: Medicare HMO

## 2021-07-14 ENCOUNTER — Ambulatory Visit: Payer: Medicare HMO | Admitting: Urology

## 2021-08-03 ENCOUNTER — Ambulatory Visit: Payer: Medicare HMO | Admitting: Urology

## 2021-08-05 ENCOUNTER — Telehealth: Payer: Self-pay | Admitting: Urology

## 2021-08-05 MED ORDER — TESTOSTERONE CYPIONATE 200 MG/ML IM SOLN: INTRAMUSCULAR | 0 refills | Status: DC

## 2021-08-05 NOTE — Telephone Encounter (Signed)
Testosterone level was low at 187.  Has he been taking testosterone and if so when was his last injection in relation to this blood draw?  PSA was normal at 2.4

## 2021-08-05 NOTE — Addendum Note (Signed)
Addended by: Abbie Sons on: 08/05/2021 03:07 PM   Modules accepted: Orders

## 2021-08-05 NOTE — Telephone Encounter (Signed)
Rx sent.  Please schedule lab visit for testosterone level and PSA in 6 months

## 2021-08-05 NOTE — Telephone Encounter (Signed)
Notified patient as instructed, patient pleased. Discussed follow-up appointments, patient agrees  

## 2021-08-05 NOTE — Telephone Encounter (Signed)
Patient states he has not been taking his injection in over six month. He had to cancel some appts. We had to wait until he got his lab work done.

## 2021-08-07 DIAGNOSIS — Z933 Colostomy status: Secondary | ICD-10-CM | POA: Diagnosis not present

## 2021-09-07 DIAGNOSIS — Z933 Colostomy status: Secondary | ICD-10-CM | POA: Diagnosis not present

## 2021-09-21 ENCOUNTER — Other Ambulatory Visit: Payer: Self-pay | Admitting: Oncology

## 2021-09-21 DIAGNOSIS — C20 Malignant neoplasm of rectum: Secondary | ICD-10-CM

## 2021-10-11 ENCOUNTER — Other Ambulatory Visit: Payer: Self-pay | Admitting: *Deleted

## 2021-10-11 DIAGNOSIS — Z933 Colostomy status: Secondary | ICD-10-CM | POA: Diagnosis not present

## 2021-10-11 NOTE — Progress Notes (Unsigned)
Name: [name] [name]   MRN: [id number]    DOB: Mar 12, 1955   Date:10/12/2021       Progress Note  Subjective  Chief Complaint  Follow Up  HPI  HTN: he does not check his bp at home, reviewed bp from the last 3 office visits at other physician's offices and bp either high or borderline. Discussed adding ACE or ARB to his regiment again today He denies chest pain, palpitation , but has intermittent SOB - s/p lobectomy left lung but stable  Hypogonadism: under the care of Dr. Bernardo Heater, last PSA was done 06/23  and itw as 2.40, his last testosterone was down  to 187 however he was supplementation at the time, had four shots since and feels energy level has improved   Dyslipidemia/Aorta atherosclerosis: discussed CT chest done 06/2021   Rectal cancer stage IV: with mets to left lung and status post left lobectomy 2021 , doing well, last CEA was done 06/2021 and it was down to 0.8 He has a colostomy back in 2016  and denies any problems. He has follow up with Dr. Duwayne Heck about last CT chest - he has intermittent cough and has some SOB with high humidity . Up to date with follow up with Dr. Grayland Ormond   AR: doing well on otc medication  History of back surgery: had back surgery many years ago, he no longer has radiculitis , permanent numbness on 4th and 5 th toe left foot,  he takes flexeril prn for back spasms.   Chemo induced neuropathy: he tried gabapentin but did not help and it caused diarrhea so he stopped, symptoms are stable, no problems on hands now just has it on both feet.   Patient Active Problem List   Diagnosis Date Noted   History of lobectomy of lung 04/07/2021   Metastatic cancer to lung ([place]) 08/11/2020   Essential hypertension 08/11/2020   Dyslipidemia 08/11/2020   Spasm of muscle of lower back 08/11/2020   Peyronie's disease 10/15/2019   Tobacco use 10/15/2019   Glaucoma of right eye 03/22/2018   Erectile dysfunction 03/22/2018   Decreased vision of right eye 09/21/2017    Colostomy in place [facility]) 09/21/2017   Hypogonadism in male 09/21/2017   Perennial allergic rhinitis with seasonal variation    Degeneration of lumbar intervertebral disc 06/26/2017   Rectal cancer ([place]) 08/05/2014    Past Surgical History:  Procedure Laterality Date   BACK SURGERY     CHOLECYSTECTOMY     COLON SURGERY     ENDOBRONCHIAL ULTRASOUND N/A 07/01/2018   Procedure: ENDOBRONCHIAL ULTRASOUND;  Surgeon: Tyler Pita, MD;  Location: [facility];  Service: Cardiopulmonary;  Laterality: N/A;   FLEXIBLE BRONCHOSCOPY Left 11/20/2018   Procedure: FLEXIBLE BRONCHOSCOPY;  Surgeon: Tyler Pita, MD;  Location: [facility];  Service: Cardiopulmonary;  Laterality: Left;   INTERCOSTAL NERVE BLOCK Left 02/03/2019   Procedure: INTERCOSTAL NERVE BLOCK WITH EXPAREL;  Surgeon: Grace Isaac, MD;  Location: [place];  Service: Thoracic;  Laterality: Left;   KNEE SURGERY Bilateral    LOBECTOMY Left 02/03/2019   Procedure: LEFT UPPER LOBECTOMY WITH LYMPH NODE DISSECTION;  Surgeon: Grace Isaac, MD;  Location: [place];  Service: Thoracic;  Laterality: Left;   PORT A CATH REVISION Left 08/05/2018   Procedure: PORT A CATH REVISION, REPOSITION LEFT;  Surgeon: Robert Bellow, MD;  Location: [facility];  Service: General;  Laterality: Left;   PORT-A-CATH REMOVAL Right 12/02/2014   Procedure: REMOVAL PORT-A-CATH;  Surgeon:  Marlyce Huge, MD;  Location: [facility];  Service: General;  Laterality: Right;   PORTACATH PLACEMENT     PORTACATH PLACEMENT Left 07/29/2018   Procedure: INSERTION PORT-A-CATH LEFT;  Surgeon: Robert Bellow, MD;  Location: [facility];  Service: General;  Laterality: Left;   THORACOTOMY/LOBECTOMY Left 02/03/2019   Procedure: MINITHORACOTOMY;  Surgeon: Grace Isaac, MD;  Location: [place];  Service: Thoracic;  Laterality: Left;   VIDEO ASSISTED THORACOSCOPY (VATS)/WEDGE RESECTION Left 02/03/2019   Procedure: VIDEO ASSISTED THORACOSCOPY (VATS)/LUNG RESECTION;  Surgeon:  Grace Isaac, MD;  Location: [place];  Service: Thoracic;  Laterality: Left;   VIDEO BRONCHOSCOPY N/A 02/03/2019   Procedure: VIDEO BRONCHOSCOPY;  Surgeon: Grace Isaac, MD;  Location: [place];  Service: Thoracic;  Laterality: N/A;   VIDEO BRONCHOSCOPY WITH ENDOBRONCHIAL ULTRASOUND N/A 12/30/2018   Procedure: VIDEO BRONCHOSCOPY WITH ENDOBRONCHIAL ULTRASOUND;  Surgeon: Grace Isaac, MD;  Location: [place];  Service: Thoracic;  Laterality: N/A;    Family History  Problem Relation Age of Onset   Cancer Mother    Hypertension Mother    COPD Mother    COPD Father    Cancer Father        Prostate CA, on Lupron   Stroke Maternal Grandmother    Cancer Paternal Grandmother    Heart attack Paternal Grandfather    Cancer Paternal Grandfather     Social History   Tobacco Use   Smoking status: Never   Smokeless tobacco: Current    Types: Chew   Tobacco comments:    might smoke a cigar once every 3 months  Substance Use Topics   Alcohol use: Yes    Alcohol/week: 0.0 standard drinks of alcohol    Comment: RARE     Current Outpatient Medications:    albuterol (VENTOLIN HFA) 108 (90 Base) MCG/ACT inhaler, INHALE 2 PUFFS BY MOUTH INTO THE LUNGS EVERY 6 HOURS AS NEEDED FOR WHEEZING OR SHORTNESS OF BREATH, Disp: 18 each, Rfl: 1   amLODipine (NORVASC) 10 MG tablet, Take 1 tablet (10 mg total) by mouth daily., Disp: 90 tablet, Rfl: 1   cetirizine (ZYRTEC) 10 MG tablet, Take 10 mg by mouth at bedtime. , Disp: , Rfl:    loperamide (IMODIUM) 2 MG capsule, Take 4 mg by mouth as needed for diarrhea or loose stools. , Disp: , Rfl:    Multiple Vitamin (MULTIVITAMIN WITH MINERALS) TABS tablet, Take 1 tablet by mouth every evening. , Disp: , Rfl:    Naphazoline-Pheniramine (VISINE-A OP), Place 1 drop into both eyes daily as needed (allergies)., Disp: , Rfl:    SYMBICORT 160-4.5 MCG/ACT inhaler, Inhale 2 puffs into the lungs 2 (two) times daily., Disp: , Rfl:    SYRINGE-NEEDLE, DISP, 3 ML (LUER  LOCK SAFETY SYRINGES) 21G X 1-1/2" 3 ML MISC, Use as directed for testosterone administration, Disp: 50 each, Rfl: 0   testosterone cypionate (DEPOTESTOSTERONE CYPIONATE) 200 MG/ML injection, INJECT 1 ML INTO THE MUSCLE EVERY 14 DAYS, Disp: 10 mL, Rfl: 0   tadalafil (CIALIS) 20 MG tablet, TAKE 1 TAB BY MOUTH 1 HOUR PRIOR TO INTERCOURSE AS NEEDED, Disp: 30 tablet, Rfl: 3 No current facility-administered medications for this visit.  Facility-Administered Medications Ordered in Other Visits:    heparin lock flush 100 unit/mL, 500 Units, Intravenous, Once, Finnegan, Kathlene November, MD   sodium chloride flush (NS) 0.9 % injection 10 mL, 10 mL, Intravenous, Once, Finnegan, Kathlene November, MD  Allergies  Allergen Reactions   Sulfa Antibiotics Hives  Morphine And Related Other (See Comments)    Altered mental status    I personally reviewed active problem list, medication list, allergies, family history, social history, health maintenance with the patient/caregiver today.   ROS  Constitutional: Negative for fever , positive for  weight change.  Respiratory: Negative for cough and shortness of breath.   Cardiovascular: Negative for chest pain or palpitations.  Gastrointestinal: Negative for abdominal pain, no bowel changes.  Musculoskeletal: Negative for gait problem or joint swelling.  Skin: Negative for rash.  Neurological: Negative for dizziness or headache.  No other specific complaints in a complete review of systems (except as listed in HPI above).   Objective  Vitals:   10/12/21 0952 10/12/21 1001  BP: (!) 142/86 136/82  Pulse: 94   Resp: 16   SpO2: 96%   Weight: 272 lb (123.4 kg)   Height: _0  (1.905 m)     Body mass index is 34 kg/m.  Physical Exam  Constitutional: Patient appears well-developed and well-nourished. Obese  No distress.  HEENT: head atraumatic, normocephalic, pupils equal and reactive to light, neck supple Cardiovascular: Normal rate, regular rhythm and normal  heart sounds.  No murmur heard. No BLE edema. Pulmonary/Chest: Effort normal and breath sounds normal. No respiratory distress. Abdominal: Soft.  There is no tenderness. Colostomy bag in place, he changes it every 3-4 days.  Psychiatric: Patient has a normal mood and affect. behavior is normal. Judgment and thought content normal.    PHQ2/9:    10/12/2021    9:52 AM 04/07/2021    9:06 AM 02/22/2021    9:15 AM 08/11/2020    2:55 PM 10/15/2019   10:32 AM  Depression screen PHQ 2/9  Decreased Interest 0 0 0 0 0  Down, Depressed, Hopeless 0 0 0 0 0  PHQ - 2 Score 0 0 0 0 0  Altered sleeping 0 0 0    Tired, decreased energy 0 0 0    Change in appetite 0 0 0    Feeling bad or failure about yourself  0 0 0    Trouble concentrating 0 0 0    Moving slowly or fidgety/restless 0 0 0    Suicidal thoughts 0 0 0    PHQ-9 Score 0 0 0    Difficult doing work/chores   Not difficult at all      phq 9 is negative   Fall Risk:    10/12/2021    9:51 AM 04/07/2021    9:06 AM 02/22/2021    9:15 AM 08/11/2020    2:54 PM 10/15/2019   10:32 AM  Fall Risk   Falls in the past year? 0 0 0 0 0  Number falls in past yr: 0 0 0 0 0  Injury with Fall? 0 0 0 0 0  Risk for fall due to : No Fall Risks No Fall Risks No Fall Risks    Follow up Falls prevention discussed Falls prevention discussed Falls prevention discussed  Falls evaluation completed      Functional Status Survey: Is the patient deaf or have difficulty hearing?: No Does the patient have difficulty seeing, even when wearing glasses/contacts?: No Does the patient have difficulty concentrating, remembering, or making decisions?: No Does the patient have difficulty walking or climbing stairs?: No Does the patient have difficulty dressing or bathing?: No Does the patient have difficulty doing errands alone such as visiting a doctor's office or shopping?: No    Assessment & Plan  1.  Chemotherapy-induced neuropathy (HCC)  Stable could not  tolerate medication  2. Essential hypertension  - amLODipine (NORVASC) 10 MG tablet; Take 1 tablet (10 mg total) by mouth daily.  Dispense: 90 tablet; Refill: 1  3. Colostomy in place [facility])  Gets 10 pouches per month   4. Need for immunization against influenza  - Flu Vaccine QUAD High Dose(Fluad)  5. Need for pneumococcal 20-valent conjugate vaccination  - Pneumococcal conjugate vaccine 20-valent (Prevnar 20)  6. Dyslipidemia   7. Hypogonadism in male  Under the care of Urologist   8. Rectal cancer ([place])  Monitored by hematologist / oncologist  9. Spasm of muscle of lower back  Needs refill of flexeril prn   10. History of lobectomy of lung   11. DDD (degenerative disc disease), lumbar

## 2021-10-12 ENCOUNTER — Encounter: Payer: Self-pay | Admitting: Oncology

## 2021-10-12 ENCOUNTER — Other Ambulatory Visit: Payer: Self-pay

## 2021-10-12 ENCOUNTER — Encounter: Payer: Self-pay | Admitting: Family Medicine

## 2021-10-12 ENCOUNTER — Ambulatory Visit (INDEPENDENT_AMBULATORY_CARE_PROVIDER_SITE_OTHER): Payer: Medicare HMO | Admitting: Family Medicine

## 2021-10-12 VITALS — BP 136/82 | HR 94 | Resp 16 | Ht 75.0 in | Wt 272.0 lb

## 2021-10-12 DIAGNOSIS — G620 Drug-induced polyneuropathy: Secondary | ICD-10-CM | POA: Diagnosis not present

## 2021-10-12 DIAGNOSIS — T451X5A Adverse effect of antineoplastic and immunosuppressive drugs, initial encounter: Secondary | ICD-10-CM | POA: Diagnosis not present

## 2021-10-12 DIAGNOSIS — E785 Hyperlipidemia, unspecified: Secondary | ICD-10-CM | POA: Diagnosis not present

## 2021-10-12 DIAGNOSIS — C20 Malignant neoplasm of rectum: Secondary | ICD-10-CM

## 2021-10-12 DIAGNOSIS — M5136 Other intervertebral disc degeneration, lumbar region: Secondary | ICD-10-CM | POA: Diagnosis not present

## 2021-10-12 DIAGNOSIS — E291 Testicular hypofunction: Secondary | ICD-10-CM

## 2021-10-12 DIAGNOSIS — M62830 Muscle spasm of back: Secondary | ICD-10-CM | POA: Diagnosis not present

## 2021-10-12 DIAGNOSIS — M51369 Other intervertebral disc degeneration, lumbar region without mention of lumbar back pain or lower extremity pain: Secondary | ICD-10-CM

## 2021-10-12 DIAGNOSIS — Z902 Acquired absence of lung [part of]: Secondary | ICD-10-CM

## 2021-10-12 DIAGNOSIS — Z23 Encounter for immunization: Secondary | ICD-10-CM | POA: Diagnosis not present

## 2021-10-12 DIAGNOSIS — I10 Essential (primary) hypertension: Secondary | ICD-10-CM | POA: Diagnosis not present

## 2021-10-12 DIAGNOSIS — Z933 Colostomy status: Secondary | ICD-10-CM

## 2021-10-12 MED ORDER — AMLODIPINE BESYLATE 10 MG PO TABS: 10.0000 mg | ORAL_TABLET | Freq: Every day | ORAL | 1 refills | Status: DC

## 2021-10-12 MED ORDER — CYCLOBENZAPRINE HCL 10 MG PO TABS: 10.0000 mg | ORAL_TABLET | Freq: Three times a day (TID) | ORAL | 0 refills | Status: DC | PRN

## 2021-10-12 NOTE — Patient Instructions (Signed)
Get second shingles vaccine at local pharmacy  COVID-19 booster  RSV vaccine

## 2021-10-13 ENCOUNTER — Encounter: Payer: Self-pay | Admitting: Pulmonary Disease

## 2021-10-15 MED ORDER — TADALAFIL 20 MG PO TABS: ORAL_TABLET | ORAL | 3 refills | Status: AC

## 2021-10-17 ENCOUNTER — Ambulatory Visit: Payer: Medicare HMO | Admitting: Pulmonary Disease

## 2021-10-17 ENCOUNTER — Telehealth: Payer: Self-pay | Admitting: *Deleted

## 2021-10-17 ENCOUNTER — Encounter: Payer: Self-pay | Admitting: Pulmonary Disease

## 2021-10-17 VITALS — BP 150/90 | HR 99 | Temp 98.0°F | Ht 75.0 in | Wt 268.6 lb

## 2021-10-17 DIAGNOSIS — R053 Chronic cough: Secondary | ICD-10-CM | POA: Diagnosis not present

## 2021-10-17 DIAGNOSIS — C7802 Secondary malignant neoplasm of left lung: Secondary | ICD-10-CM

## 2021-10-17 DIAGNOSIS — R0602 Shortness of breath: Secondary | ICD-10-CM | POA: Diagnosis not present

## 2021-10-17 NOTE — Telephone Encounter (Signed)
10/62023 12 pm Regular Bronch with cryo therapy and tumor dubulk CPT codes:  33582, 51898, 58957 Diagnosis code:  R91.8 Dr. Patsey Berthold

## 2021-10-17 NOTE — H&P (View-Only) (Signed)
Subjective:    Patient ID: [name] [name], male    DOB: 1955-10-31, 66 y.o.   MRN: [id number] Patient Care Team: Steele Sizer, MD as PCP - General (Family Medicine) Lloyd Huger, MD as Consulting Physician (Oncology) Abbie Sons, MD (Urology) Eulogio Bear, MD as Consulting Physician (Ophthalmology) Steele Sizer, MD as Attending Physician (Family Medicine) Tyler Pita, MD as Consulting Physician (Pulmonary Disease) Cardiovascular, [facility]  Chief Complaint  Patient presents with   Consult   HPI [name] [name] is a 66 year old lifelong never smoker albeit uses chewing tobacco, who presents for evaluation of a recurrent cough.  We last evaluated the patient in November 2020.  He is followed by Dr. Delight Hoh who requests reevaluation.  Recall that the patient has a history of rectal carcinoma of the anal verge.  He developed a left lung mass and required left upper lobectomy on February 03, 2019.  This was a lone metastatic deposit from adenocarcinoma.  After the procedure his cough had resolved.  However over the last year or so he has noted recurrence of a dry cough.  No hemoptysis.  He is not certain that there is a seasonal variation.  No specific triggers.  Nothing really seems to alleviate it.  He also notices some mild shortness of breath when walking on inclines or when carrying a load.  This has been present since his surgery in 2021.  He has not had any chest pain.  No lower extremity edema.  No calf tenderness.  No fevers, chills or sweats.  Of note during his initial evaluation here for the large left hilar mass, it was noted that he had some adenocarcinoma noted however there were also fungal elements noted at that time.    He does not endorse any other symptomatology.  I have reviewed his history since his last visit here in detail.   Review of Systems A 10 point review of systems was performed and it is as noted above otherwise negative.  Past  Medical History:  Diagnosis Date   Anemia    Arthritis    Cancer ([place])    rectal 2015, followed by Oncology, permanent colostomy   Colostomy in place [facility])    Dyspnea    on exertion   History of kidney stones    Hypertension    Neuromuscular disorder ([place])    Pneumonia    Seasonal allergies    Past Surgical History:  Procedure Laterality Date   BACK SURGERY     CHOLECYSTECTOMY     COLON SURGERY     ENDOBRONCHIAL ULTRASOUND N/A 07/01/2018   Procedure: ENDOBRONCHIAL ULTRASOUND;  Surgeon: Tyler Pita, MD;  Location: [facility];  Service: Cardiopulmonary;  Laterality: N/A;   FLEXIBLE BRONCHOSCOPY Left 11/20/2018   Procedure: FLEXIBLE BRONCHOSCOPY;  Surgeon: Tyler Pita, MD;  Location: [facility];  Service: Cardiopulmonary;  Laterality: Left;   INTERCOSTAL NERVE BLOCK Left 02/03/2019   Procedure: INTERCOSTAL NERVE BLOCK WITH EXPAREL;  Surgeon: Grace Isaac, MD;  Location: [place];  Service: Thoracic;  Laterality: Left;   KNEE SURGERY Bilateral    laceration of wrist Right    LOBECTOMY Left 02/03/2019   Procedure: LEFT UPPER LOBECTOMY WITH LYMPH NODE DISSECTION;  Surgeon: Grace Isaac, MD;  Location: [place];  Service: Thoracic;  Laterality: Left;   PORT A CATH REVISION Left 08/05/2018   Procedure: PORT A CATH REVISION, REPOSITION LEFT;  Surgeon: Robert Bellow, MD;  Location: [facility];  Service: General;  Laterality: Left;   PORT-A-CATH REMOVAL Right 12/02/2014   Procedure: REMOVAL PORT-A-CATH;  Surgeon: Marlyce Huge, MD;  Location: [facility];  Service: General;  Laterality: Right;   PORTACATH PLACEMENT     PORTACATH PLACEMENT Left 07/29/2018   Procedure: INSERTION PORT-A-CATH LEFT;  Surgeon: Robert Bellow, MD;  Location: [facility];  Service: General;  Laterality: Left;   THORACOTOMY/LOBECTOMY Left 02/03/2019   Procedure: MINITHORACOTOMY;  Surgeon: Grace Isaac, MD;  Location: [place];  Service: Thoracic;  Laterality: Left;   VIDEO ASSISTED  THORACOSCOPY (VATS)/WEDGE RESECTION Left 02/03/2019   Procedure: VIDEO ASSISTED THORACOSCOPY (VATS)/LUNG RESECTION;  Surgeon: Grace Isaac, MD;  Location: [place];  Service: Thoracic;  Laterality: Left;   VIDEO BRONCHOSCOPY N/A 02/03/2019   Procedure: VIDEO BRONCHOSCOPY;  Surgeon: Grace Isaac, MD;  Location: [place];  Service: Thoracic;  Laterality: N/A;   VIDEO BRONCHOSCOPY WITH ENDOBRONCHIAL ULTRASOUND N/A 12/30/2018   Procedure: VIDEO BRONCHOSCOPY WITH ENDOBRONCHIAL ULTRASOUND;  Surgeon: Grace Isaac, MD;  Location: [place];  Service: Thoracic;  Laterality: N/A;   Patient Active Problem List   Diagnosis Date Noted   History of lobectomy of lung 04/07/2021   Metastatic cancer to lung ([place]) 08/11/2020   Essential hypertension 08/11/2020   Dyslipidemia 08/11/2020   Spasm of muscle of lower back 08/11/2020   Peyronie's disease 10/15/2019   Tobacco use 10/15/2019   Glaucoma of right eye 03/22/2018   Erectile dysfunction 03/22/2018   Decreased vision of right eye 09/21/2017   Colostomy in place [facility]) 09/21/2017   Hypogonadism in male 09/21/2017   Perennial allergic rhinitis with seasonal variation    Degeneration of lumbar intervertebral disc 06/26/2017   Rectal cancer ([place]) 08/05/2014   Family History  Problem Relation Age of Onset   Cancer Mother    Hypertension Mother    COPD Mother    COPD Father    Cancer Father        Prostate CA, on Lupron   Stroke Maternal Grandmother    Cancer Paternal Grandmother    Heart attack Paternal Grandfather    Cancer Paternal Grandfather    Social History   Tobacco Use   Smoking status: Never   Smokeless tobacco: Current    Types: Chew   Tobacco comments:    might smoke a cigar once every 3 months.  No cigar use.  Still using dip.  10/17/2021 11:11 am .  Substance Use Topics   Alcohol use: Yes    Alcohol/week: 0.0 standard drinks of alcohol    Comment: RARE   Allergies  Allergen Reactions   Sulfa Antibiotics Hives    Morphine And Related Other (See Comments)    Altered mental status   Current Meds  Medication Sig   albuterol (VENTOLIN HFA) 108 (90 Base) MCG/ACT inhaler INHALE 2 PUFFS BY MOUTH INTO THE LUNGS EVERY 6 HOURS AS NEEDED FOR WHEEZING OR SHORTNESS OF BREATH   amLODipine (NORVASC) 10 MG tablet Take 1 tablet (10 mg total) by mouth daily.   cetirizine (ZYRTEC) 10 MG tablet Take 10 mg by mouth at bedtime.    cyclobenzaprine (FLEXERIL) 10 MG tablet Take 1 tablet (10 mg total) by mouth 3 (three) times daily as needed for muscle spasms.   loperamide (IMODIUM) 2 MG capsule Take 4 mg by mouth as needed for diarrhea or loose stools.    Multiple Vitamin (MULTIVITAMIN WITH MINERALS) TABS tablet Take 1 tablet by mouth every evening.    Naphazoline-Pheniramine (VISINE-A OP) Place 1 drop into both  eyes daily as needed (allergies).   SYMBICORT 160-4.5 MCG/ACT inhaler Inhale 2 puffs into the lungs 2 (two) times daily.   SYRINGE-NEEDLE, DISP, 3 ML (LUER LOCK SAFETY SYRINGES) 21G X 1-1/2" 3 ML MISC Use as directed for testosterone administration   tadalafil (CIALIS) 20 MG tablet TAKE 1 TAB BY MOUTH 1 HOUR PRIOR TO INTERCOURSE AS NEEDED   testosterone cypionate (DEPOTESTOSTERONE CYPIONATE) 200 MG/ML injection INJECT 1 ML INTO THE MUSCLE EVERY 14 DAYS   Immunization History  Administered Date(s) Administered   Fluad Quad(high Dose 65+) 10/12/2021   Influenza,inj,Quad PF,6+ Mos 03/06/2017, 09/21/2017, 11/28/2018, 10/15/2019   Influenza-Unspecified 11/17/2020   PFIZER(Purple Top)SARS-COV-2 Vaccination 03/10/2019, 04/09/2019   PNEUMOCOCCAL CONJUGATE-20 10/12/2021   Tdap 02/21/2016   Zoster Recombinat (Shingrix) 07/03/2016       Objective:   Physical Exam BP (!) 150/90 (BP Location: Left Arm, Patient Position: Sitting, Cuff Size: Large)   Pulse 99   Temp 98 F (36.7 C) (Oral)   Ht _0  (1.905 m)   Wt 268 lb 9.6 oz (121.8 kg)   SpO2 97%   BMI 33.57 kg/m  GENERAL: Well-developed, well-nourished gentleman  no acute distress, fully ambulatory, no conversational dyspnea.  Intermittently coughing during visit. HEAD: Normocephalic, atraumatic.  EYES: Pupils equal, round, reactive to light.  No scleral icterus.  MOUTH: Oral mucosa moist.  No thrush. NECK: Supple. No thyromegaly. Trachea midline. No JVD.  No adenopathy. PULMONARY: Good air entry bilaterally.  No adventitious sounds. CARDIOVASCULAR: S1 and S2. Regular rate and rhythm.  No rubs, murmurs or gallops heard.  Port-A-Cath on left. ABDOMEN: Benign. MUSCULOSKELETAL: No joint deformity, no clubbing, no edema.  NEUROLOGIC: No overt focal deficit, no gait disturbance, speech is fluent. SKIN: Intact,warm,dry. PSYCH: Mood and behavior normal.  Review of most recent chest CT cannot exclude potential recurrence at suture line:       Assessment & Plan:     ICD-10-CM   1. Malignant neoplasm metastatic to left lung [facility])  C78.02    Status post left upper lobectomy January 2021 Query recurrence at suture line Query bronchial stump fungal invasion    2. Persistent cough - recurrent issue  R05.3    Cannot exclude recurrence at suture line Airway inspection Potential cryotherapy    3. Shortness of breath  R06.02    Present since surgery in 2021 Previous PFTs with mild restrictive physiology Suspect decreased volumes due to prior left upper lobectomy     We will proceed with bronchoscopy for airway examination with potential cryotherapy and local tumor debulking if necessary.    Procedure scheduled for 6 October at 12:15 PM.  Benefits, limitations and potential complications of the procedure were discussed with the patient/family.  Complications from bronchoscopy are rare and most often minor, but if they occur they may include breathing difficulty, vocal cord spasm, hoarseness, slight fever, vomiting, dizziness, bronchospasm, infection, low blood oxygen, bleeding from biopsy site, or an allergic reaction to medications.  It is uncommon for  patients to experience other more serious complications for example: Collapsed lung requiring chest tube placement, respiratory failure, heart attack and/or cardiac arrhythmia.  Patient agrees to proceed  C. Derrill Kay, MD Advanced Bronchoscopy PCCM [facility]    *This note was dictated using voice recognition software/Dragon.  Despite best efforts to proofread, errors can occur which can change the meaning. Any transcriptional errors that result from this process are unintentional and may not be fully corrected at the time of dictation.

## 2021-10-17 NOTE — Patient Instructions (Signed)
We have scheduled your procedure for 6 October at 12:30 PM.  You will get instructions from the preprocedure clinic with regards to what time to be there etc.  We will see you in follow-up here in 4 to 6 weeks time call sooner should any new problems arise.

## 2021-10-17 NOTE — Telephone Encounter (Signed)
Called centralized scheduled scheduling and scheduled procedure for 10/28/2021 at 12 pm.  His preop phone call is scheduled for 10/21/21 between 8 am and 1 pm.  His covid test is scheduled for 10/26/21 in the medical arts at preop testing at 9 am.    Patient was given information verbally and on his avs, he verbalized understanding.  Nothing further needed.

## 2021-10-17 NOTE — Progress Notes (Unsigned)
Subjective:    Patient ID: [name] [name], male    DOB: 1955-10-31, 66 y.o.   MRN: [id number] Patient Care Team: Steele Sizer, MD as PCP - General (Family Medicine) Lloyd Huger, MD as Consulting Physician (Oncology) Abbie Sons, MD (Urology) Eulogio Bear, MD as Consulting Physician (Ophthalmology) Steele Sizer, MD as Attending Physician (Family Medicine) Tyler Pita, MD as Consulting Physician (Pulmonary Disease) Cardiovascular, [facility]  Chief Complaint  Patient presents with   Consult   HPI Dayquan is a 66 year old lifelong never smoker albeit uses chewing tobacco, who presents for evaluation of a recurrent cough.  We last evaluated the patient in November 2020.  He is followed by Dr. Delight Hoh who requests reevaluation.  Recall that the patient has a history of rectal carcinoma of the anal verge.  He developed a left lung mass and required left upper lobectomy on February 03, 2019.  This was a lone metastatic deposit from adenocarcinoma.  After the procedure his cough had resolved.  However over the last year or so he has noted recurrence of a dry cough.  No hemoptysis.  He is not certain that there is a seasonal variation.  No specific triggers.  Nothing really seems to alleviate it.  He also notices some mild shortness of breath when walking on inclines or when carrying a load.  This has been present since his surgery in 2021.  He has not had any chest pain.  No lower extremity edema.  No calf tenderness.  No fevers, chills or sweats.  Of note during his initial evaluation here for the large left hilar mass, it was noted that he had some adenocarcinoma noted however there were also fungal elements noted at that time.    He does not endorse any other symptomatology.  I have reviewed his history since his last visit here in detail.   Review of Systems A 10 point review of systems was performed and it is as noted above otherwise negative.  Past  Medical History:  Diagnosis Date   Anemia    Arthritis    Cancer ([place])    rectal 2015, followed by Oncology, permanent colostomy   Colostomy in place [facility])    Dyspnea    on exertion   History of kidney stones    Hypertension    Neuromuscular disorder ([place])    Pneumonia    Seasonal allergies    Past Surgical History:  Procedure Laterality Date   BACK SURGERY     CHOLECYSTECTOMY     COLON SURGERY     ENDOBRONCHIAL ULTRASOUND N/A 07/01/2018   Procedure: ENDOBRONCHIAL ULTRASOUND;  Surgeon: Tyler Pita, MD;  Location: [facility];  Service: Cardiopulmonary;  Laterality: N/A;   FLEXIBLE BRONCHOSCOPY Left 11/20/2018   Procedure: FLEXIBLE BRONCHOSCOPY;  Surgeon: Tyler Pita, MD;  Location: [facility];  Service: Cardiopulmonary;  Laterality: Left;   INTERCOSTAL NERVE BLOCK Left 02/03/2019   Procedure: INTERCOSTAL NERVE BLOCK WITH EXPAREL;  Surgeon: Grace Isaac, MD;  Location: [place];  Service: Thoracic;  Laterality: Left;   KNEE SURGERY Bilateral    laceration of wrist Right    LOBECTOMY Left 02/03/2019   Procedure: LEFT UPPER LOBECTOMY WITH LYMPH NODE DISSECTION;  Surgeon: Grace Isaac, MD;  Location: [place];  Service: Thoracic;  Laterality: Left;   PORT A CATH REVISION Left 08/05/2018   Procedure: PORT A CATH REVISION, REPOSITION LEFT;  Surgeon: Robert Bellow, MD;  Location: [facility];  Service: General;  Laterality: Left;   PORT-A-CATH REMOVAL Right 12/02/2014   Procedure: REMOVAL PORT-A-CATH;  Surgeon: Marlyce Huge, MD;  Location: [facility];  Service: General;  Laterality: Right;   PORTACATH PLACEMENT     PORTACATH PLACEMENT Left 07/29/2018   Procedure: INSERTION PORT-A-CATH LEFT;  Surgeon: Robert Bellow, MD;  Location: [facility];  Service: General;  Laterality: Left;   THORACOTOMY/LOBECTOMY Left 02/03/2019   Procedure: MINITHORACOTOMY;  Surgeon: Grace Isaac, MD;  Location: [place];  Service: Thoracic;  Laterality: Left;   VIDEO ASSISTED  THORACOSCOPY (VATS)/WEDGE RESECTION Left 02/03/2019   Procedure: VIDEO ASSISTED THORACOSCOPY (VATS)/LUNG RESECTION;  Surgeon: Grace Isaac, MD;  Location: [place];  Service: Thoracic;  Laterality: Left;   VIDEO BRONCHOSCOPY N/A 02/03/2019   Procedure: VIDEO BRONCHOSCOPY;  Surgeon: Grace Isaac, MD;  Location: [place];  Service: Thoracic;  Laterality: N/A;   VIDEO BRONCHOSCOPY WITH ENDOBRONCHIAL ULTRASOUND N/A 12/30/2018   Procedure: VIDEO BRONCHOSCOPY WITH ENDOBRONCHIAL ULTRASOUND;  Surgeon: Grace Isaac, MD;  Location: [place];  Service: Thoracic;  Laterality: N/A;   Patient Active Problem List   Diagnosis Date Noted   History of lobectomy of lung 04/07/2021   Metastatic cancer to lung ([place]) 08/11/2020   Essential hypertension 08/11/2020   Dyslipidemia 08/11/2020   Spasm of muscle of lower back 08/11/2020   Peyronie's disease 10/15/2019   Tobacco use 10/15/2019   Glaucoma of right eye 03/22/2018   Erectile dysfunction 03/22/2018   Decreased vision of right eye 09/21/2017   Colostomy in place [facility]) 09/21/2017   Hypogonadism in male 09/21/2017   Perennial allergic rhinitis with seasonal variation    Degeneration of lumbar intervertebral disc 06/26/2017   Rectal cancer ([place]) 08/05/2014   Family History  Problem Relation Age of Onset   Cancer Mother    Hypertension Mother    COPD Mother    COPD Father    Cancer Father        Prostate CA, on Lupron   Stroke Maternal Grandmother    Cancer Paternal Grandmother    Heart attack Paternal Grandfather    Cancer Paternal Grandfather    Social History   Tobacco Use   Smoking status: Never   Smokeless tobacco: Current    Types: Chew   Tobacco comments:    might smoke a cigar once every 3 months.  No cigar use.  Still using dip.  10/17/2021 11:11 am .  Substance Use Topics   Alcohol use: Yes    Alcohol/week: 0.0 standard drinks of alcohol    Comment: RARE   Allergies  Allergen Reactions   Sulfa Antibiotics Hives    Morphine And Related Other (See Comments)    Altered mental status   Current Meds  Medication Sig   albuterol (VENTOLIN HFA) 108 (90 Base) MCG/ACT inhaler INHALE 2 PUFFS BY MOUTH INTO THE LUNGS EVERY 6 HOURS AS NEEDED FOR WHEEZING OR SHORTNESS OF BREATH   amLODipine (NORVASC) 10 MG tablet Take 1 tablet (10 mg total) by mouth daily.   cetirizine (ZYRTEC) 10 MG tablet Take 10 mg by mouth at bedtime.    cyclobenzaprine (FLEXERIL) 10 MG tablet Take 1 tablet (10 mg total) by mouth 3 (three) times daily as needed for muscle spasms.   loperamide (IMODIUM) 2 MG capsule Take 4 mg by mouth as needed for diarrhea or loose stools.    Multiple Vitamin (MULTIVITAMIN WITH MINERALS) TABS tablet Take 1 tablet by mouth every evening.    Naphazoline-Pheniramine (VISINE-A OP) Place 1 drop into both  eyes daily as needed (allergies).   SYMBICORT 160-4.5 MCG/ACT inhaler Inhale 2 puffs into the lungs 2 (two) times daily.   SYRINGE-NEEDLE, DISP, 3 ML (LUER LOCK SAFETY SYRINGES) 21G X 1-1/2" 3 ML MISC Use as directed for testosterone administration   tadalafil (CIALIS) 20 MG tablet TAKE 1 TAB BY MOUTH 1 HOUR PRIOR TO INTERCOURSE AS NEEDED   testosterone cypionate (DEPOTESTOSTERONE CYPIONATE) 200 MG/ML injection INJECT 1 ML INTO THE MUSCLE EVERY 14 DAYS   Immunization History  Administered Date(s) Administered   Fluad Quad(high Dose 65+) 10/12/2021   Influenza,inj,Quad PF,6+ Mos 03/06/2017, 09/21/2017, 11/28/2018, 10/15/2019   Influenza-Unspecified 11/17/2020   PFIZER(Purple Top)SARS-COV-2 Vaccination 03/10/2019, 04/09/2019   PNEUMOCOCCAL CONJUGATE-20 10/12/2021   Tdap 02/21/2016   Zoster Recombinat (Shingrix) 07/03/2016       Objective:   Physical Exam BP (!) 150/90 (BP Location: Left Arm, Patient Position: Sitting, Cuff Size: Large)   Pulse 99   Temp 98 F (36.7 C) (Oral)   Ht _0  (1.905 m)   Wt 268 lb 9.6 oz (121.8 kg)   SpO2 97%   BMI 33.57 kg/m  GENERAL: Well-developed, well-nourished gentleman  no acute distress, fully ambulatory, no conversational dyspnea.  Intermittently coughing during visit. HEAD: Normocephalic, atraumatic.  EYES: Pupils equal, round, reactive to light.  No scleral icterus.  MOUTH: Oral mucosa moist.  No thrush. NECK: Supple. No thyromegaly. Trachea midline. No JVD.  No adenopathy. PULMONARY: Good air entry bilaterally.  No adventitious sounds. CARDIOVASCULAR: S1 and S2. Regular rate and rhythm.  No rubs, murmurs or gallops heard.  Port-A-Cath on left. ABDOMEN: Benign. MUSCULOSKELETAL: No joint deformity, no clubbing, no edema.  NEUROLOGIC: No overt focal deficit, no gait disturbance, speech is fluent. SKIN: Intact,warm,dry. PSYCH: Mood and behavior normal.  Review of most recent chest CT cannot exclude potential recurrence at suture line:       Assessment & Plan:     ICD-10-CM   1. Malignant neoplasm metastatic to left lung [facility])  C78.02    Status post left upper lobectomy January 2021 Query recurrence at suture line Query bronchial stump fungal invasion    2. Persistent cough - recurrent issue  R05.3    Cannot exclude recurrence at suture line Airway inspection Potential cryotherapy    3. Shortness of breath  R06.02    Present since surgery in 2021 Previous PFTs with mild restrictive physiology Suspect decreased volumes due to prior left upper lobectomy     We will proceed with bronchoscopy for airway examination with potential cryotherapy and local tumor debulking if necessary.    Procedure scheduled for 6 October at 12:15 PM.  Benefits, limitations and potential complications of the procedure were discussed with the patient/family.  Complications from bronchoscopy are rare and most often minor, but if they occur they may include breathing difficulty, vocal cord spasm, hoarseness, slight fever, vomiting, dizziness, bronchospasm, infection, low blood oxygen, bleeding from biopsy site, or an allergic reaction to medications.  It is uncommon for  patients to experience other more serious complications for example: Collapsed lung requiring chest tube placement, respiratory failure, heart attack and/or cardiac arrhythmia.  Patient agrees to proceed  C. Derrill Kay, MD Advanced Bronchoscopy PCCM [facility]    *This note was dictated using voice recognition software/Dragon.  Despite best efforts to proofread, errors can occur which can change the meaning. Any transcriptional errors that result from this process are unintentional and may not be fully corrected at the time of dictation.

## 2021-10-18 NOTE — Telephone Encounter (Signed)
Noted  

## 2021-10-18 NOTE — Telephone Encounter (Signed)
For the codes 31628, [id number], [phone] Prior Auth Not Required Refer # [id number]

## 2021-10-21 ENCOUNTER — Other Ambulatory Visit: Payer: Self-pay

## 2021-10-21 ENCOUNTER — Encounter
Admission: RE | Admit: 2021-10-21 | Discharge: 2021-10-21 | Disposition: A | Discharge status: Home / Self Care | Payer: Medicare HMO | Source: Ambulatory Visit | Attending: Pulmonary Disease | Admitting: Pulmonary Disease

## 2021-10-21 ENCOUNTER — Encounter: Payer: Self-pay | Admitting: Pulmonary Disease

## 2021-10-21 DIAGNOSIS — Z01812 Encounter for preprocedural laboratory examination: Secondary | ICD-10-CM

## 2021-10-21 HISTORY — DX: Myoneural disorder, unspecified: G70.9

## 2021-10-21 HISTORY — DX: Essential (primary) hypertension: I10

## 2021-10-21 HISTORY — DX: Unspecified osteoarthritis, unspecified site: M19.90

## 2021-10-21 NOTE — Telephone Encounter (Signed)
Dr. Gonzalez, please advise. Thanks 

## 2021-10-21 NOTE — Patient Instructions (Addendum)
Your procedure is scheduled on: 10/28/21 - Friday Report to the Registration Desk on the 1st floor of the [facility]. To find out your arrival time, please call [phone] between 1PM - 3PM on: 10/27/21 - Thursday If your arrival time is 6:00 am, do not arrive prior to that time as the [place] entrance doors do not open until 6:00 am.  REMEMBER: Instructions that are not followed completely may result in serious medical risk, up to and including death; or upon the discretion of your surgeon and anesthesiologist your surgery may need to be rescheduled.  Do not eat food or drink any fluids after midnight the night before surgery.  No gum chewing, lozengers or hard candies.  TAKE THESE MEDICATIONS THE MORNING OF SURGERY WITH A SIP OF WATER:  - albuterol (VENTOLIN HFA)  - amLODipine (NORVASC)  - SYMBICORT  One week prior to surgery: Stop Anti-inflammatories (NSAIDS) such as Advil, Aleve, Ibuprofen, Motrin, Naproxen, Naprosyn and Aspirin based products such as Excedrin, Goodys Powder, BC Powder.  Stop ANY OVER THE COUNTER supplements until after surgery.  You may however, continue to take Tylenol if needed for pain up until the day of surgery.  No Alcohol for 24 hours before or after surgery.  No Smoking including e-cigarettes for 24 hours prior to surgery.  No chewable tobacco products for at least 6 hours prior to surgery.  No nicotine patches on the day of surgery.  Do not use any "recreational" drugs for at least a week prior to your surgery.  Please be advised that the combination of cocaine and anesthesia may have negative outcomes, up to and including death. If you test positive for cocaine, your surgery will be cancelled.  On the morning of surgery brush your teeth with toothpaste and water, you may rinse your mouth with mouthwash if you wish. Do not swallow any toothpaste or mouthwash.  Do not wear jewelry, make-up, hairpins, clips or nail polish.  Do not wear  lotions, powders, or perfumes.   Do not shave body from the neck down 48 hours prior to surgery just in case you cut yourself which could leave a site for infection.  Also, freshly shaved skin may become irritated if using the CHG soap.  Contact lenses, hearing aids and dentures may not be worn into surgery.  Do not bring valuables to the hospital. [facility] is not responsible for any missing/lost belongings or valuables.   Notify your doctor if there is any change in your medical condition (cold, fever, infection).  Wear comfortable clothing (specific to your surgery type) to the hospital.  After surgery, you can help prevent lung complications by doing breathing exercises.  Take deep breaths and cough every 1-2 hours. Your doctor may order a device called an Incentive Spirometer to help you take deep breaths. When coughing or sneezing, hold a pillow firmly against your incision with both hands. This is called "splinting." Doing this helps protect your incision. It also decreases belly discomfort.  If you are being admitted to the hospital overnight, leave your suitcase in the car. After surgery it may be brought to your room.  If you are being discharged the day of surgery, you will not be allowed to drive home. You will need a responsible adult (18 years or older) to drive you home and stay with you that night.   If you are taking public transportation, you will need to have a responsible adult (18 years or older) with you. Please confirm  with your physician that it is acceptable to use public transportation.   Please call the [place] Dept. at [phone] if you have any questions about these instructions.  Surgery Visitation Policy:  Patients undergoing a surgery or procedure may have two family members or support persons with them as long as the person is not COVID-19 positive or experiencing its symptoms.   Inpatient Visitation:    Visiting hours are 7 a.m.  to 8 p.m. Up to four visitors are allowed at one time in a patient room, including children. The visitors may rotate out with other people during the day. One designated support person (adult) may remain overnight.

## 2021-10-21 NOTE — Telephone Encounter (Signed)
This is actually something that is the preference of the anesthesiologist.  The best thing would be for him to contact the preadmission clinic.  Overton Mam, NP could probably help with this question as he deals with the anesthesiologist all the time.  From my standpoint we can access his port if that is what he prefers.

## 2021-10-24 ENCOUNTER — Other Ambulatory Visit: Payer: Self-pay | Admitting: Urology

## 2021-10-26 ENCOUNTER — Encounter: Payer: Self-pay | Admitting: Urgent Care

## 2021-10-26 ENCOUNTER — Encounter
Admission: RE | Admit: 2021-10-26 | Discharge: 2021-10-26 | Disposition: A | Discharge status: Home / Self Care | Payer: Medicare HMO | Source: Ambulatory Visit | Attending: Pulmonary Disease | Admitting: Pulmonary Disease

## 2021-10-26 DIAGNOSIS — I10 Essential (primary) hypertension: Secondary | ICD-10-CM | POA: Insufficient documentation

## 2021-10-26 DIAGNOSIS — C7800 Secondary malignant neoplasm of unspecified lung: Secondary | ICD-10-CM | POA: Insufficient documentation

## 2021-10-26 DIAGNOSIS — Z902 Acquired absence of lung [part of]: Secondary | ICD-10-CM | POA: Diagnosis not present

## 2021-10-26 DIAGNOSIS — Z01810 Encounter for preprocedural cardiovascular examination: Secondary | ICD-10-CM | POA: Diagnosis not present

## 2021-10-26 DIAGNOSIS — Z01818 Encounter for other preprocedural examination: Secondary | ICD-10-CM | POA: Diagnosis not present

## 2021-10-26 DIAGNOSIS — Z01812 Encounter for preprocedural laboratory examination: Secondary | ICD-10-CM

## 2021-10-26 LAB — BASIC METABOLIC PANEL
Anion gap: 3 — ABNORMAL LOW (ref 5–15)
BUN: 16 mg/dL (ref 8–23)
CO2: 24 mmol/L (ref 22–32)
Calcium: 8.6 mg/dL — ABNORMAL LOW (ref 8.9–10.3)
Chloride: 110 mmol/L (ref 98–111)
Creatinine, Ser: 0.89 mg/dL (ref 0.61–1.24)
GFR, Estimated: >60 mL/min (ref >60)
Glucose, Bld: 95 mg/dL (ref 70–99)
Potassium: 3.9 mmol/L (ref 3.5–5.1)
Sodium: 137 mmol/L (ref 135–145)

## 2021-10-26 LAB — CBC
HCT: 48.6 % (ref 39.0–52.0)
Hemoglobin: 15.5 g/dL (ref 13.0–17.0)
MCH: 27.2 pg (ref 26.0–34.0)
MCHC: 31.9 g/dL (ref 30.0–36.0)
MCV: 85.3 fL (ref 80.0–100.0)
Platelets: 417 10*3/uL — ABNORMAL HIGH (ref 150–400)
RBC: 5.7 MIL/uL (ref 4.22–5.81)
RDW: 14.2 % (ref 11.5–15.5)
WBC: 4.9 10*3/uL (ref 4.0–10.5)
nRBC: 0 % (ref 0.0–0.2)

## 2021-10-27 ENCOUNTER — Encounter: Payer: Self-pay | Admitting: Oncology

## 2021-10-28 ENCOUNTER — Ambulatory Visit: Payer: Medicare HMO | Admitting: Urgent Care

## 2021-10-28 ENCOUNTER — Other Ambulatory Visit: Payer: Self-pay

## 2021-10-28 ENCOUNTER — Encounter: Payer: Self-pay | Admitting: Pulmonary Disease

## 2021-10-28 ENCOUNTER — Encounter
Admission: RE | Disposition: A | Discharge status: Home / Self Care | Payer: Self-pay | Source: Home / Self Care | Attending: Pulmonary Disease

## 2021-10-28 ENCOUNTER — Ambulatory Visit: Payer: Medicare HMO

## 2021-10-28 ENCOUNTER — Ambulatory Visit
Admission: RE | Admit: 2021-10-28 | Discharge: 2021-10-28 | Disposition: A | Discharge status: Home / Self Care | Payer: Medicare HMO | Attending: Pulmonary Disease | Admitting: Pulmonary Disease

## 2021-10-28 DIAGNOSIS — I10 Essential (primary) hypertension: Secondary | ICD-10-CM | POA: Diagnosis not present

## 2021-10-28 DIAGNOSIS — J984 Other disorders of lung: Secondary | ICD-10-CM | POA: Insufficient documentation

## 2021-10-28 DIAGNOSIS — R079 Chest pain, unspecified: Secondary | ICD-10-CM | POA: Diagnosis not present

## 2021-10-28 DIAGNOSIS — E669 Obesity, unspecified: Secondary | ICD-10-CM | POA: Diagnosis not present

## 2021-10-28 DIAGNOSIS — R059 Cough, unspecified: Secondary | ICD-10-CM | POA: Insufficient documentation

## 2021-10-28 DIAGNOSIS — F17220 Nicotine dependence, chewing tobacco, uncomplicated: Secondary | ICD-10-CM | POA: Insufficient documentation

## 2021-10-28 DIAGNOSIS — R0602 Shortness of breath: Secondary | ICD-10-CM | POA: Insufficient documentation

## 2021-10-28 DIAGNOSIS — Z85038 Personal history of other malignant neoplasm of large intestine: Secondary | ICD-10-CM | POA: Insufficient documentation

## 2021-10-28 DIAGNOSIS — L929 Granulomatous disorder of the skin and subcutaneous tissue, unspecified: Secondary | ICD-10-CM

## 2021-10-28 DIAGNOSIS — Z902 Acquired absence of lung [part of]: Secondary | ICD-10-CM | POA: Diagnosis not present

## 2021-10-28 DIAGNOSIS — D381 Neoplasm of uncertain behavior of trachea, bronchus and lung: Secondary | ICD-10-CM | POA: Diagnosis not present

## 2021-10-28 DIAGNOSIS — Z6833 Body mass index (BMI) 33.0-33.9, adult: Secondary | ICD-10-CM | POA: Insufficient documentation

## 2021-10-28 DIAGNOSIS — C7802 Secondary malignant neoplasm of left lung: Secondary | ICD-10-CM | POA: Diagnosis not present

## 2021-10-28 DIAGNOSIS — R918 Other nonspecific abnormal finding of lung field: Secondary | ICD-10-CM | POA: Diagnosis not present

## 2021-10-28 DIAGNOSIS — Z85118 Personal history of other malignant neoplasm of bronchus and lung: Secondary | ICD-10-CM | POA: Diagnosis not present

## 2021-10-28 DIAGNOSIS — C7800 Secondary malignant neoplasm of unspecified lung: Secondary | ICD-10-CM

## 2021-10-28 DIAGNOSIS — Z9221 Personal history of antineoplastic chemotherapy: Secondary | ICD-10-CM | POA: Diagnosis not present

## 2021-10-28 DIAGNOSIS — Z1152 Encounter for screening for COVID-19: Secondary | ICD-10-CM | POA: Diagnosis not present

## 2021-10-28 DIAGNOSIS — Z01812 Encounter for preprocedural laboratory examination: Secondary | ICD-10-CM

## 2021-10-28 HISTORY — PX: VIDEO BRONCHOSCOPY: SHX5072

## 2021-10-28 LAB — SARS CORONAVIRUS 2 BY RT PCR: SARS Coronavirus 2 by RT PCR: NEGATIVE

## 2021-10-28 SURGERY — VIDEO BRONCHOSCOPY WITHOUT FLUORO
Anesthesia: General

## 2021-10-28 MED ORDER — ROCURONIUM BROMIDE 100 MG/10ML IV SOLN
INTRAVENOUS | Status: DC | PRN
  Administered 2021-10-28: 40 mg via INTRAVENOUS

## 2021-10-28 MED ORDER — SUGAMMADEX SODIUM 500 MG/5ML IV SOLN
INTRAVENOUS | Status: DC | PRN
  Administered 2021-10-28: 250 mg via INTRAVENOUS

## 2021-10-28 MED ORDER — FENTANYL CITRATE (PF) 100 MCG/2ML IJ SOLN
INTRAMUSCULAR | Status: AC
  Filled 2021-10-28: qty 2

## 2021-10-28 MED ORDER — DEXAMETHASONE SODIUM PHOSPHATE 10 MG/ML IJ SOLN
INTRAMUSCULAR | Status: AC
  Filled 2021-10-28: qty 1

## 2021-10-28 MED ORDER — ORAL CARE MOUTH RINSE: 15.0000 mL | Freq: Once | OROMUCOSAL | Status: AC

## 2021-10-28 MED ORDER — ONDANSETRON HCL 4 MG/2ML IJ SOLN
INTRAMUSCULAR | Status: AC
  Filled 2021-10-28: qty 2

## 2021-10-28 MED ORDER — DEXAMETHASONE SODIUM PHOSPHATE 10 MG/ML IJ SOLN
INTRAMUSCULAR | Status: DC | PRN
  Administered 2021-10-28: 10 mg via INTRAVENOUS

## 2021-10-28 MED ORDER — FAMOTIDINE 20 MG PO TABS
ORAL_TABLET | ORAL | Status: AC
  Administered 2021-10-28: 20 mg via ORAL
  Filled 2021-10-28: qty 1

## 2021-10-28 MED ORDER — FENTANYL CITRATE (PF) 100 MCG/2ML IJ SOLN
INTRAMUSCULAR | Status: DC | PRN
  Administered 2021-10-28 (×2): 50 ug via INTRAVENOUS

## 2021-10-28 MED ORDER — CHLORHEXIDINE GLUCONATE 0.12 % MT SOLN: 15.0000 mL | Freq: Once | OROMUCOSAL | Status: AC

## 2021-10-28 MED ORDER — HEPARIN SOD (PORK) LOCK FLUSH 100 UNIT/ML IV SOLN: 500.0000 [IU] | INTRAVENOUS | Status: AC | PRN

## 2021-10-28 MED ORDER — SODIUM CHLORIDE FLUSH 0.9 % IV SOLN
INTRAVENOUS | Status: AC
  Administered 2021-10-28: 10 mL
  Filled 2021-10-28: qty 10

## 2021-10-28 MED ORDER — EPHEDRINE 5 MG/ML INJ
INTRAVENOUS | Status: AC
  Filled 2021-10-28: qty 5

## 2021-10-28 MED ORDER — ROCURONIUM BROMIDE 10 MG/ML (PF) SYRINGE
PREFILLED_SYRINGE | INTRAVENOUS | Status: AC
  Filled 2021-10-28: qty 10

## 2021-10-28 MED ORDER — SODIUM CHLORIDE 0.9 % IV SOLN: Freq: Once | INTRAVENOUS | Status: DC

## 2021-10-28 MED ORDER — PROPOFOL 10 MG/ML IV BOLUS
INTRAVENOUS | Status: DC | PRN
  Administered 2021-10-28 (×2): 50 mg via INTRAVENOUS
  Administered 2021-10-28: 150 mg via INTRAVENOUS

## 2021-10-28 MED ORDER — SUGAMMADEX SODIUM 500 MG/5ML IV SOLN
INTRAVENOUS | Status: AC
  Filled 2021-10-28: qty 5

## 2021-10-28 MED ORDER — ONDANSETRON HCL 4 MG/2ML IJ SOLN
INTRAMUSCULAR | Status: DC | PRN
  Administered 2021-10-28: 4 mg via INTRAVENOUS

## 2021-10-28 MED ORDER — PROPOFOL 10 MG/ML IV BOLUS
INTRAVENOUS | Status: AC
  Filled 2021-10-28: qty 20

## 2021-10-28 MED ORDER — CHLORHEXIDINE GLUCONATE 0.12 % MT SOLN
OROMUCOSAL | Status: AC
  Administered 2021-10-28: 15 mL via OROMUCOSAL
  Filled 2021-10-28: qty 15

## 2021-10-28 MED ORDER — LACTATED RINGERS IV SOLN: INTRAVENOUS | Status: DC

## 2021-10-28 MED ORDER — LIDOCAINE HCL (CARDIAC) PF 100 MG/5ML IV SOSY
PREFILLED_SYRINGE | INTRAVENOUS | Status: DC | PRN
  Administered 2021-10-28: 50 mg via INTRAVENOUS

## 2021-10-28 MED ORDER — EPHEDRINE SULFATE (PRESSORS) 50 MG/ML IJ SOLN
INTRAMUSCULAR | Status: DC | PRN
  Administered 2021-10-28: 10 mg via INTRAVENOUS

## 2021-10-28 MED ORDER — HEPARIN SOD (PORK) LOCK FLUSH 100 UNIT/ML IV SOLN
INTRAVENOUS | Status: AC
  Administered 2021-10-28: 500 [IU]
  Filled 2021-10-28: qty 5

## 2021-10-28 MED ORDER — FAMOTIDINE 20 MG PO TABS: 20.0000 mg | ORAL_TABLET | Freq: Once | ORAL | Status: AC

## 2021-10-28 NOTE — Transfer of Care (Signed)
Immediate Anesthesia Transfer of Care Note  Patient: [name] [name]  Procedure(s) Performed: VIDEO BRONCHOSCOPY WITH CRYO and DEBULKING  Patient Location: PACU  Anesthesia Type:General  Level of Consciousness: drowsy and patient cooperative  Airway & Oxygen Therapy: Patient Spontanous Breathing and Patient connected to face mask oxygen  Post-op Assessment: Report given to RN and Post -op Vital signs reviewed and stable  Post vital signs: Reviewed and stable  Last Vitals:  Vitals Value Taken Time  BP 128/82 10/28/21 1337  Temp 36.4 C 10/28/21 1332  Pulse 70 10/28/21 1338  Resp 10 10/28/21 1338  SpO2 100 % 10/28/21 1338  Vitals shown include unvalidated device data.  Last Pain:  Vitals:   10/28/21 1332  TempSrc:   PainSc: 0-No pain         Complications: No notable events documented.

## 2021-10-28 NOTE — Op Note (Addendum)
PROCEDURES:  Bronchoscopy with biopsies Bronchoscopic spray cryotherapy for tissue ablation  PROCEDURE DATE: 10/28/2021  TIME: 12:30 p.m. NAME:  [name] [name]  DOB:[birth year]  MRN: [id number] LOC:  [facility]/None    HOSP DAY: N/A   Indications/Preliminary Diagnosis: Patient is a 66 year old with a history of metastatic colon CA to the lung and status post left upper lobectomy.  Now with shortness of breath and persistent cough.  Imaging consistent with potential recurrence versus granulation tissue at the suture line. Preoperative diagnosis: Possible malignancy recurrence at the suture line versus granulation tissue Impending obstruction left mainstem bronchus   Consent: (Place X beside choice/s below)  The benefits, risks and possible complications of the procedure were        explained to:  _X__ patient  _X__ patient's family  ___ other:___________  who verbalized understanding and gave:  ___ verbal  ___ written  _X__ verbal and written  ___ telephone  ___ other:________ consent.      Unable to obtain consent; procedure performed on emergent basis.     Other:    Benefits, limitations and potential complications of the procedure were discussed with the patient/family.  Complications from bronchoscopy are rare and most often minor, but if they occur they may include breathing difficulty, vocal cord spasm, hoarseness, slight fever, vomiting, dizziness, bronchospasm, infection, low blood oxygen, bleeding from biopsy site, or an allergic reaction to medications.  It is uncommon for patients to experience other more serious complications for example: Collapsed lung requiring chest tube placement, respiratory failure, heart attack and/or cardiac arrhythmia.  Patient agrees to proceed.    Anesthesia type: General endotracheal  Surgeon: Renold Don, MD Assistant/Scrub: Sullivan Lone, RRT Circulator: Annia Belt, RRT Anesthesiologist/CRNA: Iran Ouch, MD/Janice Rise Patience,  CRNA Cytotechnology: [facility]   PROCEDURE DETAILS: Patient was taken to Procedure Room 2 (Bronchoscopy Suite) in the OR area.  Appropriate Timeout performed and correct patient, name, ID and laterality confirmed.  Patient was inducted under general anesthesia and intubated with an 9.0 ET tube without difficulty.  Once the patient was under adequate general anesthesia a Portex adapter was placed in the ET tube flange.  Through the Portex adapter the Olympus video therapeutic bronchoscope was then advanced.  The visible distal trachea was normal, carina appeared sharp. The right upper lobe , right middle lobe, lower lobe bronchi were free of endobronchial lesions or any other abnormality.  At this point bronchoscope was brought to the left mainstem and examination revealed almost complete occlusion of the left mainstem bronchus by tissue protruding from a left upper lobectomy suture line.  There was a portion of the tissue that was acting as a "ball valve" on the left mainstem bronchus.  This was bypassed with the bronchoscope and the lower lobe subsegments were then examined there were significant inspissated secretions on the left lower lobe and these were lavaged till clear.  Patient then received 1-10,000 solution of epinephrine, 3 mL via bronchial wash.  Once this was performed I proceeded to perform 2 initial cycles of cryoablation with a truFreeze spray cryotherapy catheter.  A total of 2 cycles of 5-second freeze were performed on the tissue.  The patient was appropriately passively vented by deflating the ET tube cuff and disconnecting from the circuit, breath-holding per anesthesia.  After the appropriate freezing time was completed a total of 10 biopsies were obtained from the excess tissue noted around the suture line of the left upper lobe.  ROSE was consistent with inflammatory cells.  Brushings were  then performed x2, transbronchial biopsies were performed with an Exelon 19-gauge needle x2, material  was placed on CytoLyt.  This was then concluded with a bronchoalveolar lavage with 20 mL instilled and approximately 15 mL return.  BAL fluid was then placed on CytoLyt.  The airway was then examined thoroughly to ensure adequate hemostasis.  At this point a total of 3 cycles of 5 seconds right cryotherapy with truFreeze were performed.  The patient was allowed to passively vent as described previously by deflating ET tube balloon, disengaging ET tube flange and breath-holding during the procedure.  The patient was allowed to equilibrate between freezing cycles.  After examination and inspection of the airway and noting excellent hemostasis, the patient received 9 mL of 1% lidocaine via bronchial lavage prior to retrieving the bronchoscope.  At this point the procedure was terminated and the patient was allowed to emerge from general anesthesia.  Patient was extubated in the procedure room and transported to the PACU in satisfactory condition.  The patient did not have any complaints of chest pain or shortness of breath postprocedure.  Postprocedure examination shows symmetrical lung sounds.  Postprocedure chest x-ray showed no pneumothorax.  Patient tolerated the procedure well.   SPECIMENS (Sites): (Place X beside choice below)  Specimens Description   No Specimens Obtained     Washings   X Lavage 15 mL, left  X Biopsies X10, left upper lobe suture line  X Fine Needle Aspirates X 2, left upper lobe suture line  X Brushings X2, left upper lobe suture line   Sputum    FINDINGS:   Left mainstem bronchus during inspiration, partly obstructed by excessive tissue (blue arrow), suture material easily seen (red arrow):   Left mainstem bronchus during exhalation with almost complete occlusion of the bronchus:   Bronchoscope past the left main obstruction:   During cryotherapy:   After second cryoablation sequence during respiratory cycle:   ESTIMATED BLOOD LOSS: Less than 2  mL   COMPLICATIONS/RESOLUTION: none, post procedure chest x-ray showed no pneumothorax:     IMPRESSION:POST-PROCEDURE DX:   Near complete obstruction of the left mainstem bronchus due to granulation tissue versus cancer recurrence on the left upper lobe suture line.  [name] [name] the former. Status post biopsies, brushings, fine-needle aspiration and BAL  Status post cryoablation of tissue  RECOMMENDATION/PLAN:   Follow-up pathology reports Patient has appropriate pulmonary/oncology appointments     C. Derrill Kay, MD Advanced Bronchoscopy PCCM [facility]    *This note was dictated using voice recognition software/Dragon.  Despite best efforts to proofread, errors can occur which can change the meaning.  Any change was purely unintentional.

## 2021-10-28 NOTE — Anesthesia Preprocedure Evaluation (Addendum)
Anesthesia Evaluation  Patient identified by MRN, date of birth, ID band Patient awake    Reviewed: Allergy & Precautions, NPO status , Patient's Chart, lab work & pertinent test results  Airway Mallampati: III  TM Distance: >3 FB Neck ROM: Full    Dental no notable dental hx.    Pulmonary shortness of breath and with exertion,  Metastatic cancer to Left lung s/p lobectomy Previous PFTs with mild restrictive physiology Cough   Pulmonary exam normal breath sounds clear to auscultation       Cardiovascular Exercise Tolerance: Poor hypertension, Pt. on medications Normal cardiovascular exam Rhythm:Regular Rate:Normal  PORTACATH    Neuro/Psych negative neurological ROS  negative psych ROS   GI/Hepatic Neg liver ROS, Rectal cancer s/p chemo permanent colostomy   Endo/Other  negative endocrine ROS  Renal/GU negative Renal ROS     Musculoskeletal negative musculoskeletal ROS (+)   Abdominal (+) + obese,   Peds  Hematology negative hematology ROS (+)   Anesthesia Other Findings left hilar lung mass s/p lobectomy  Reproductive/Obstetrics                            Anesthesia Physical  Anesthesia Plan  ASA: 3  Anesthesia Plan: General   Post-op Pain Management: Minimal or no pain anticipated   Induction: Intravenous  PONV Risk Score and Plan: 2 and Ondansetron and Dexamethasone  Airway Management Planned: Oral ETT  Additional Equipment:   Intra-op Plan:   Post-operative Plan: Extubation in OR  Informed Consent: I have reviewed the patients History and Physical, chart, labs and discussed the procedure including the risks, benefits and alternatives for the proposed anesthesia with the patient or authorized representative who has indicated his/her understanding and acceptance.     Dental advisory given  Plan Discussed with: CRNA and Anesthesiologist  Anesthesia Plan Comments:         Anesthesia Quick Evaluation

## 2021-10-28 NOTE — Discharge Instructions (Signed)

## 2021-10-28 NOTE — Anesthesia Postprocedure Evaluation (Signed)
Anesthesia Post Note  Patient: [name] [name]  Procedure(s) Performed: VIDEO BRONCHOSCOPY WITH CRYO and DEBULKING  Patient location during evaluation: Endoscopy Anesthesia Type: General Level of consciousness: awake and alert Pain management: pain level controlled Vital Signs Assessment: post-procedure vital signs reviewed and stable Respiratory status: spontaneous breathing, nonlabored ventilation and respiratory function stable Cardiovascular status: blood pressure returned to baseline and stable Postop Assessment: no apparent nausea or vomiting Anesthetic complications: no   No notable events documented.   Last Vitals:  Vitals:   10/28/21 1345 10/28/21 1418  BP: 110/75 137/76  Pulse: 72 85  Resp: 11 16  Temp:  36.8 C  SpO2: 100% 95%    Last Pain:  Vitals:   10/28/21 1418  TempSrc:   PainSc: 0-No pain                 [place] Ouch

## 2021-10-28 NOTE — Anesthesia Procedure Notes (Signed)
Procedure Name: Intubation Date/Time: 10/28/2021 12:40 PM  Performed by: Jonna Clark, CRNAPre-anesthesia Checklist: Patient identified, Patient being monitored, Timeout performed, Emergency Drugs available and Suction available Patient Re-evaluated:Patient Re-evaluated prior to induction Oxygen Delivery Method: Circle system utilized Preoxygenation: Pre-oxygenation with 100% oxygen Induction Type: IV induction Ventilation: Mask ventilation without difficulty Laryngoscope Size: 4 and McGraph Grade View: Grade I Tube type: Oral Tube size: 9.0 mm Number of attempts: 1 Airway Equipment and Method: Stylet Placement Confirmation: ETT inserted through vocal cords under direct vision, positive ETCO2 and breath sounds checked- equal and bilateral Secured at: 23 cm Tube secured with: Tape Dental Injury: Teeth and Oropharynx as per pre-operative assessment

## 2021-10-28 NOTE — Interval H&P Note (Signed)
[name] [name] has presented today for surgery, with the diagnosis of COLON CANCER METASTATIC TO THE LUNG STATUS POST LEFT UPPER LOBECTOMY, rule out recurrence.  The various methods of treatment have been discussed with the patient and family. After consideration of risks, benefits and other options for treatment, the patient has consented to  Procedure(s): BRONCHOSCOPY WITH BIOPSIES AND SPRAY CRYOTHERAPY AND TUMOR DEBULKING IF NECESSARY-LEFT as a surgical intervention.  The patient's history has been reviewed, patient examined, no change in status, stable for surgery.  I have reviewed the patient's chart and labs.  Questions were answered to the patient's satisfaction.    Benefits, limitations and potential complications of the procedure were discussed with the patient/family.  Complications from bronchoscopy are rare and most often minor, but if they occur they may include breathing difficulty, vocal cord spasm, hoarseness, slight fever, vomiting, dizziness, bronchospasm, infection, low blood oxygen, bleeding from biopsy site, or an allergic reaction to medications.  It is uncommon for patients to experience other more serious complications for example: Collapsed lung requiring chest tube placement, respiratory failure, heart attack and/or cardiac arrhythmia.Patient agrees to proceed.  Renold Don, MD Advanced Bronchoscopy PCCM [facility]    *This note was dictated using voice recognition software/Dragon.  Despite best efforts to proofread, errors can occur which can change the meaning. Any transcriptional errors that result from this process are unintentional and may not be fully corrected at the time of dictation.

## 2021-10-30 NOTE — Progress Notes (Signed)
I am almost done he [facility]  [phone] Fax:([phone]) [phone]  ID: [name] Cower Tippens OB: 1955/01/31  MR#: [id number]  PIR#:[id number]  Patient Care Team: Steele Sizer, MD as PCP - General (Family Medicine) Lloyd Huger, MD as Consulting Physician (Oncology) Abbie Sons, MD (Urology) Eulogio Bear, MD as Consulting Physician (Ophthalmology) Steele Sizer, MD as Attending Physician (Family Medicine) Tyler Pita, MD as Consulting Physician (Pulmonary Disease) Cardiovascular, [place]   I connected with [name] [name] on 11/03/21 at  3:30 PM EDT by video enabled telemedicine visit and verified that I am speaking with the correct person using two identifiers.   I discussed the limitations, risks, security and privacy concerns of performing an evaluation and management service by telemedicine and the availability of in-person appointments. I also discussed with the patient that there may be a patient responsible charge related to this service. The patient expressed understanding and agreed to proceed.   Other persons participating in the visit and their role in the encounter: Patient, MD.  Patient's location: Home. Provider's location: Clinic.  CHIEF COMPLAINT: Recurrent, stage IV rectal adenocarcinoma.  INTERVAL HISTORY: Patient agreed to video assisted telemedicine visit for further evaluation and discussion of his laboratory results.  He recently had bronchoscopy with pulmonology which ablated some granulation tissue in his pulmonary bronchus significantly improving his breathing.  He currently feels well and is asymptomatic.  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  He has no neurologic complaints.  He denies any weakness or fatigue. He does not complain of peripheral neuropathy today.  He denies any recent fevers or illnesses.  He denies any chest pain or hemoptysis.  He denies nausea, vomiting,  constipation, or diarrhea. He has noted no blood or melena in his colostomy bag.  He has no urinary complaints.  Patient offers no specific complaints today.  REVIEW OF SYSTEMS:   Review of Systems  Constitutional:  Negative for fever, malaise/fatigue and weight loss.  Respiratory: Negative.  Negative for cough and shortness of breath.   Cardiovascular: Negative.  Negative for chest pain and leg swelling.  Gastrointestinal: Negative.  Negative for abdominal pain, blood in stool, constipation, diarrhea and melena.  Genitourinary: Negative.  Negative for dysuria.  Musculoskeletal: Negative.  Negative for back pain.  Skin: Negative.  Negative for rash.  Neurological: Negative.  Negative for tingling, sensory change, focal weakness, weakness and headaches.  Psychiatric/Behavioral: Negative.  The patient is not nervous/anxious and does not have insomnia.     As per HPI. Otherwise, a complete review of systems is negative.  PAST MEDICAL HISTORY: Past Medical History:  Diagnosis Date   Anemia    Arthritis    Cancer ([place])    rectal 2015, followed by Oncology, permanent colostomy   Colostomy in place [facility])    Dyspnea    on exertion   History of kidney stones    Hypertension    Neuromuscular disorder ([place])    Pneumonia    Seasonal allergies     PAST SURGICAL HISTORY: Past Surgical History:  Procedure Laterality Date   BACK SURGERY     CHOLECYSTECTOMY     COLON SURGERY     ENDOBRONCHIAL ULTRASOUND N/A 07/01/2018   Procedure: ENDOBRONCHIAL ULTRASOUND;  Surgeon: Tyler Pita, MD;  Location: [facility];  Service: Cardiopulmonary;  Laterality: N/A;   FLEXIBLE BRONCHOSCOPY Left 11/20/2018   Procedure: FLEXIBLE BRONCHOSCOPY;  Surgeon: Tyler Pita, MD;  Location: [facility];  Service: Cardiopulmonary;  Laterality: Left;   INTERCOSTAL NERVE BLOCK Left 02/03/2019   Procedure: INTERCOSTAL NERVE BLOCK WITH EXPAREL;  Surgeon: Grace Isaac, MD;  Location: [place];  Service:  Thoracic;  Laterality: Left;   KNEE SURGERY Bilateral    laceration of wrist Right    LOBECTOMY Left 02/03/2019   Procedure: LEFT UPPER LOBECTOMY WITH LYMPH NODE DISSECTION;  Surgeon: Grace Isaac, MD;  Location: [place];  Service: Thoracic;  Laterality: Left;   PORT A CATH REVISION Left 08/05/2018   Procedure: PORT A CATH REVISION, REPOSITION LEFT;  Surgeon: Robert Bellow, MD;  Location: [place] [facility];  Service: General;  Laterality: Left;   PORT-A-CATH REMOVAL Right 12/02/2014   Procedure: REMOVAL PORT-A-CATH;  Surgeon: Marlyce Huge, MD;  Location: [facility];  Service: General;  Laterality: Right;   PORTACATH PLACEMENT     PORTACATH PLACEMENT Left 07/29/2018   Procedure: INSERTION PORT-A-CATH LEFT;  Surgeon: Robert Bellow, MD;  Location: [facility];  Service: General;  Laterality: Left;   THORACOTOMY/LOBECTOMY Left 02/03/2019   Procedure: MINITHORACOTOMY;  Surgeon: Grace Isaac, MD;  Location: [place];  Service: Thoracic;  Laterality: Left;   VIDEO ASSISTED THORACOSCOPY (VATS)/WEDGE RESECTION Left 02/03/2019   Procedure: VIDEO ASSISTED THORACOSCOPY (VATS)/LUNG RESECTION;  Surgeon: Grace Isaac, MD;  Location[facility] OR;  Service: Thoracic;  Laterality: Left;   VIDEO BRONCHOSCOPY N/A 02/03/2019   Procedure: VIDEO BRONCHOSCOPY;  Surgeon: Grace Isaac, MD;  Location[facility];  Service: Thoracic;  Laterality: N/A;   VIDEO BRONCHOSCOPY N/A 10/28/2021   Procedure: VIDEO BRONCHOSCOPY WITH CRYO and DEBULKING;  Surgeon: Tyler Pita, MD;  Location: [facility];  Service: Cardiopulmonary;  Laterality: N/A;   VIDEO BRONCHOSCOPY WITH ENDOBRONCHIAL ULTRASOUND N/A 12/30/2018   Procedure: VIDEO BRONCHOSCOPY WITH ENDOBRONCHIAL ULTRASOUND;  Surgeon: Grace Isaac, MD;  Location: [place];  Service: Thoracic;  Laterality: N/A;    FAMILY HISTORY: Father with prostate cancer.  COPD, CHF.     ADVANCED DIRECTIVES:    HEALTH MAINTENANCE: Social History   Tobacco Use    Smoking status: Never   Smokeless tobacco: Current    Types: Chew   Tobacco comments:    might smoke a cigar once every 3 months.  No cigar use.  Still using dip.  10/17/2021 11:11 am .  Vaping Use   Vaping Use: Never used  Substance Use Topics   Alcohol use: Yes    Alcohol/week: 0.0 standard drinks of alcohol    Comment: RARE   Drug use: No     Colonoscopy:  PAP:  Bone density:  Lipid panel:  Allergies  Allergen Reactions   Sulfa Antibiotics Hives   Morphine And Related Other (See Comments)    Altered mental status    Current Outpatient Medications  Medication Sig Dispense Refill   acetaminophen (TYLENOL) 500 MG tablet Take 1,000 mg by mouth every 6 (six) hours as needed for mild pain.     albuterol (VENTOLIN HFA) 108 (90 Base) MCG/ACT inhaler INHALE 2 PUFFS BY MOUTH INTO THE LUNGS EVERY 6 HOURS AS NEEDED FOR WHEEZING OR SHORTNESS OF BREATH 18 each 1   amLODipine (NORVASC) 10 MG tablet Take 1 tablet (10 mg total) by mouth daily. 90 tablet 1   calcium carbonate (TUMS - DOSED IN MG ELEMENTAL CALCIUM) 500 MG chewable tablet Chew 2 tablets by mouth as needed for indigestion or heartburn.     cetirizine (ZYRTEC) 10 MG tablet Take 10 mg by mouth at bedtime.  cyclobenzaprine (FLEXERIL) 10 MG tablet Take 1 tablet (10 mg total) by mouth 3 (three) times daily as needed for muscle spasms. 90 tablet 0   loperamide (IMODIUM) 2 MG capsule Take 4 mg by mouth as needed for diarrhea or loose stools.      Multiple Vitamin (MULTIVITAMIN WITH MINERALS) TABS tablet Take 1 tablet by mouth every evening.      Naphazoline-Pheniramine (VISINE-A OP) Place 1 drop into both eyes daily as needed (allergies).     SYMBICORT 160-4.5 MCG/ACT inhaler Inhale 2 puffs into the lungs 2 (two) times daily.     SYRINGE-NEEDLE, DISP, 3 ML (LUER LOCK SAFETY SYRINGES) 21G X 1-1/2" 3 ML MISC Use as directed for testosterone administration 50 each 0   tadalafil (CIALIS) 20 MG tablet TAKE 1 TAB BY MOUTH 1 HOUR PRIOR TO  INTERCOURSE AS NEEDED 30 tablet 3   testosterone cypionate (DEPOTESTOSTERONE CYPIONATE) 200 MG/ML injection INJECT 1 ML INTRAMUSCULARLY EVERY 14 DAYS 6 mL 1   No current facility-administered medications for this visit.   Facility-Administered Medications Ordered in Other Visits  Medication Dose Route Frequency Provider Last Rate Last Admin   heparin lock flush 100 unit/mL  500 Units Intravenous Once Lloyd Huger, MD       sodium chloride flush (NS) 0.9 % injection 10 mL  10 mL Intravenous Once Lloyd Huger, MD        OBJECTIVE: There were no vitals filed for this visit.   There is no height or weight on file to calculate BMI.    ECOG FS:0 - Asymptomatic  General: Well-developed, well-nourished, no acute distress. HEENT: Normocephalic. Neuro: Alert, answering all questions appropriately. Cranial nerves grossly intact. Psych: Normal affect.  LAB RESULTS:  Lab Results  Component Value Date   NA 139 11/01/2021   K 3.8 11/01/2021   CL 107 11/01/2021   CO2 27 11/01/2021   GLUCOSE 95 11/01/2021   BUN 21 11/01/2021   CREATININE 0.67 11/01/2021   CALCIUM 8.9 11/01/2021   PROT 6.7 11/01/2021   ALBUMIN 3.9 11/01/2021   AST 17 11/01/2021   ALT 13 11/01/2021   ALKPHOS 61 11/01/2021   BILITOT 0.6 11/01/2021   GFRNONAA >60 11/01/2021   GFRAA >60 09/16/2019    Lab Results  Component Value Date   WBC 6.0 11/01/2021   NEUTROABS 4.3 11/01/2021   HGB 14.9 11/01/2021   HCT 46.6 11/01/2021   MCV 85.0 11/01/2021   PLT 348 11/01/2021     STUDIES: DG Chest Port 1 View  Result Date: 10/28/2021 CLINICAL DATA:  Status post bronchoscopic biopsy EXAM: PORTABLE CHEST 1 VIEW COMPARISON:  Previous chest radiograph done on 04/10/2019, CT chest done on 06/30/2021 FINDINGS: Transverse diameter of heart is increased. There are no signs of alveolar pulmonary edema. There is poor inspiration. There are linear densities in the lower lung fields. There is no focal pulmonary  consolidation. Left lateral CP angle is indistinct. There is no pneumothorax. IMPRESSION: Linear densities in the lower lung fields suggest subsegmental atelectasis. There is no focal pulmonary consolidation. Possible minimal left pleural effusion. Electronically Signed   By: Elmer Picker M.D.   On: 10/28/2021 14:22    ONCOLOGY HISTORY:  Patient was initially considered a clinical stage IIIa, but after completing neoadjuvant 5-FU and XRT followed by surgical excision on October 08, 2013 he was noted to be pathologic stage Ia.  He declined adjuvant FOLFOX at that time.  More recently, patient's CEA started trending up and CT of the chest  revealed a large 7.7 cm left upper lobe mass biopsy consistent with rectal adenocarcinoma.  Patient completed cycle 6 of FOLFOX plus Avastin on October 16, 2018.  Repeat biopsy did not reveal any evidence of malignancy, but persistent fungal infection.  Patient underwent repeat bronchoscopy on December 30, 2018 which did in fact reveal residual malignancy.  He subsequently underwent surgical resection on February 03, 2019 with complete removal of residual disease.  One lymph node had a "fragment" of malignancy.  Patient did not require additional adjuvant chemotherapy or XRT.   ASSESSMENT: Recurrent, stage IV rectal adenocarcinoma.  PLAN:    1.  Recurrent, stage IV rectal adenocarcinoma: See oncology history as above.  No evidence of disease.  Patient's most recent CT scan of chest, abdomen, and pelvis on March 25, 2021 reviewed independently with no obvious evidence of recurrent or progressive disease, but did note new "clustered nodularity" in the right middle lobe possibly infectious or aspiration related.  Repeat chest CT on June 30, 2021 reported nodularity resolved or resolving and no evidence of recurrent or metastatic disease.  CEA remains within normal limits at 0.9.  No intervention is needed at this time.  Return to clinic in March 2024 with repeat  laboratory work, imaging, and video assisted telemedicine visit.     2.  Cough/shortness of breath: Significantly improved after ablation of granulation tissue by pulmonary.  Case discussed with pulmonology.  Plan on 2 more interventions.  Follow-up with them as indicated.     I provided 20 minutes of face-to-face video visit time during this encounter which included chart review, counseling, and coordination of care as documented above.    Patient expressed understanding and was in agreement with this plan. He also understands that He can call clinic at any time with any questions, concerns, or complaints.    Lloyd Huger, MD   11/03/2021 3:47 PM

## 2021-10-31 LAB — CYTOLOGY - NON PAP

## 2021-11-01 ENCOUNTER — Telehealth: Payer: Self-pay | Admitting: Family Medicine

## 2021-11-01 ENCOUNTER — Other Ambulatory Visit: Payer: Self-pay | Admitting: Anatomic Pathology & Clinical Pathology

## 2021-11-01 ENCOUNTER — Inpatient Hospital Stay: Payer: Medicare HMO | Attending: Oncology

## 2021-11-01 DIAGNOSIS — Z8042 Family history of malignant neoplasm of prostate: Secondary | ICD-10-CM | POA: Diagnosis not present

## 2021-11-01 DIAGNOSIS — R972 Elevated prostate specific antigen [PSA]: Secondary | ICD-10-CM | POA: Insufficient documentation

## 2021-11-01 DIAGNOSIS — C20 Malignant neoplasm of rectum: Secondary | ICD-10-CM | POA: Insufficient documentation

## 2021-11-01 LAB — CBC WITH DIFFERENTIAL/PLATELET
Abs Immature Granulocytes: 0.04 10*3/uL (ref 0.00–0.07)
Basophils Absolute: 0.1 10*3/uL (ref 0.0–0.1)
Basophils Relative: 1 %
Eosinophils Absolute: 0.2 10*3/uL (ref 0.0–0.5)
Eosinophils Relative: 3 %
HCT: 46.6 % (ref 39.0–52.0)
Hemoglobin: 14.9 g/dL (ref 13.0–17.0)
Immature Granulocytes: 1 %
Lymphocytes Relative: 14 %
Lymphs Abs: 0.8 10*3/uL (ref 0.7–4.0)
MCH: 27.2 pg (ref 26.0–34.0)
MCHC: 32 g/dL (ref 30.0–36.0)
MCV: 85 fL (ref 80.0–100.0)
Monocytes Absolute: 0.6 10*3/uL (ref 0.1–1.0)
Monocytes Relative: 10 %
Neutro Abs: 4.3 10*3/uL (ref 1.7–7.7)
Neutrophils Relative %: 71 %
Platelets: 348 10*3/uL (ref 150–400)
RBC: 5.48 MIL/uL (ref 4.22–5.81)
RDW: 14.4 % (ref 11.5–15.5)
WBC: 6 10*3/uL (ref 4.0–10.5)
nRBC: 0 % (ref 0.0–0.2)

## 2021-11-01 LAB — COMPREHENSIVE METABOLIC PANEL
ALT: 13 U/L (ref 0–44)
AST: 17 U/L (ref 15–41)
Albumin: 3.9 g/dL (ref 3.5–5.0)
Alkaline Phosphatase: 61 U/L (ref 38–126)
Anion gap: 5 (ref 5–15)
BUN: 21 mg/dL (ref 8–23)
CO2: 27 mmol/L (ref 22–32)
Calcium: 8.9 mg/dL (ref 8.9–10.3)
Chloride: 107 mmol/L (ref 98–111)
Creatinine, Ser: 0.67 mg/dL (ref 0.61–1.24)
GFR, Estimated: >60 mL/min (ref >60)
Glucose, Bld: 95 mg/dL (ref 70–99)
Potassium: 3.8 mmol/L (ref 3.5–5.1)
Sodium: 139 mmol/L (ref 135–145)
Total Bilirubin: 0.6 mg/dL (ref 0.3–1.2)
Total Protein: 6.7 g/dL (ref 6.5–8.1)

## 2021-11-01 LAB — SURGICAL PATHOLOGY

## 2021-11-01 LAB — PSA: Prostatic Specific Antigen: 4.15 ng/mL — ABNORMAL HIGH (ref 0.00–4.00)

## 2021-11-01 MED ORDER — SODIUM CHLORIDE 0.9% FLUSH
10.0000 mL | Freq: Once | INTRAVENOUS | Status: AC
  Administered 2021-11-01: 10 mL via INTRAVENOUS
  Filled 2021-11-01: qty 10

## 2021-11-01 MED ORDER — HEPARIN SOD (PORK) LOCK FLUSH 100 UNIT/ML IV SOLN
INTRAVENOUS | Status: AC
  Filled 2021-11-01: qty 5

## 2021-11-01 MED ORDER — HEPARIN SOD (PORK) LOCK FLUSH 100 UNIT/ML IV SOLN
500.0000 [IU] | Freq: Once | INTRAVENOUS | Status: AC
  Administered 2021-11-01: 500 [IU] via INTRAVENOUS
  Filled 2021-11-01: qty 5

## 2021-11-01 NOTE — Telephone Encounter (Unsigned)
Copied from [place] [id number]. Topic: General - Other >> Nov 01, 2021  4:00 PM [place] B wrote: Reason for CRM: Venida Jarvis , called in about request for colostomy supplies she sent the end of September. Please call back if received and status.

## 2021-11-01 NOTE — Telephone Encounter (Signed)
Routing to Dr. Patsey Berthold.

## 2021-11-01 NOTE — Telephone Encounter (Signed)
I am glad he is feeling better.  He will probably need 2 more sessions with the cryotherapy to keep things "permanent" but we will review on follow-up.  I have not yet called him with results of the biopsies because the biopsies are still pending however the other samples that we took during the procedure were benign (no tumor).  Once I know the final we will give him a call.

## 2021-11-01 NOTE — Telephone Encounter (Signed)
Faxed 10/20/21. Re-faxed today.

## 2021-11-02 ENCOUNTER — Encounter: Payer: Self-pay | Admitting: Pulmonary Disease

## 2021-11-02 LAB — TESTOSTERONE: Testosterone: 703 ng/dL (ref 264–916)

## 2021-11-02 NOTE — Telephone Encounter (Signed)
Dr. Gonzalez, please advise. Thanks 

## 2021-11-02 NOTE — Telephone Encounter (Signed)
Patient is aware of the results.  This was discussed via telephone conversation on 11/02/2021 at 5:10 PM

## 2021-11-03 ENCOUNTER — Inpatient Hospital Stay (HOSPITAL_BASED_OUTPATIENT_CLINIC_OR_DEPARTMENT_OTHER): Payer: Medicare HMO | Admitting: Oncology

## 2021-11-03 DIAGNOSIS — C20 Malignant neoplasm of rectum: Secondary | ICD-10-CM

## 2021-11-03 LAB — CEA: CEA: 0.9 ng/mL (ref 0.0–4.7)

## 2021-11-03 NOTE — Telephone Encounter (Signed)
noted 

## 2021-11-10 ENCOUNTER — Other Ambulatory Visit: Payer: Medicare HMO

## 2021-11-10 DIAGNOSIS — Z933 Colostomy status: Secondary | ICD-10-CM | POA: Diagnosis not present

## 2021-11-10 NOTE — Progress Notes (Signed)
Tumor Board Documentation  [name] [name] was presented by Dr Patsey Berthold at our Tumor Board on 11/10/2021, which included representatives from medical oncology, radiology, pathology, pharmacy, pulmonology, radiation oncology, navigation, research, internal medicine, palliative care, surgical.  [name] [name] currently presents as a current patient, for [place], for new positive pathology with history of the following treatments: active survellience, surgical intervention(s), adjuvant chemotherapy.  Additionally, we reviewed previous medical and familial history, history of present illness, and recent lab results along with all available histopathologic and imaging studies. The tumor board considered available treatment options and made the following recommendations: Active surveillance (FU Pulmonolgy 3 - 4 weeks)    The following procedures/referrals were also placed: No orders of the defined types were placed in this encounter.   Clinical Trial Status: not discussed   Staging used:   AJCC Staging:       Group: Stage IV Rectal Cancer, Questionable new met to lung   National site-specific guidelines   were discussed with respect to the case.  Tumor board is a meeting of clinicians from various specialty areas who evaluate and discuss patients for whom a multidisciplinary approach is being considered. Final determinations in the plan of care are those of the provider(s). The responsibility for follow up of recommendations given during tumor board is that of the provider.   Today's extended care, comprehensive team conference, [name] [name] was not present for the discussion and was not examined.   Multidisciplinary Tumor Board is a multidisciplinary case peer review process.  Decisions discussed in the Multidisciplinary Tumor Board reflect the opinions of the specialists present at the conference without having examined the patient.  Ultimately, treatment and diagnostic decisions rest with the primary provider(s)  and the patient.

## 2021-11-12 ENCOUNTER — Other Ambulatory Visit: Payer: Self-pay | Admitting: Family Medicine

## 2021-11-17 ENCOUNTER — Telehealth: Payer: Self-pay | Admitting: Pulmonary Disease

## 2021-11-17 MED ORDER — METHYLPREDNISOLONE 4 MG PO TBPK: ORAL_TABLET | ORAL | 0 refills | Status: DC

## 2021-11-17 MED ORDER — AZITHROMYCIN 250 MG PO TABS: ORAL_TABLET | ORAL | 0 refills | Status: DC

## 2021-11-17 NOTE — Telephone Encounter (Signed)
I spoke with the patient. No fevers or chills. Coughing has worsened since he talked to you last. Coughing up yellow sputum. Having SOB No Covid Test and he does not have one at home Does not monitor his O2.

## 2021-11-17 NOTE — Telephone Encounter (Signed)
I have sent in the patient's medication. I notified the patient. Nothing further is needed.

## 2021-11-17 NOTE — Telephone Encounter (Signed)
Yes he should test for COVID.  He will need a total of 3 treatments from the one that we gave during his bronchoscopy.  He has a follow-up appointment with me to set up that second treatment.  In the meantime we can order a Medrol Dosepak and a Azithromycin Z-Pak and see if that helps him.

## 2021-12-02 ENCOUNTER — Encounter: Payer: Self-pay | Admitting: Pulmonary Disease

## 2021-12-02 ENCOUNTER — Telehealth: Payer: Self-pay

## 2021-12-02 ENCOUNTER — Ambulatory Visit: Payer: Medicare HMO | Admitting: Pulmonary Disease

## 2021-12-02 VITALS — BP 146/86 | HR 86 | Temp 98.0°F | Ht 75.0 in | Wt 268.6 lb

## 2021-12-02 DIAGNOSIS — J9809 Other diseases of bronchus, not elsewhere classified: Secondary | ICD-10-CM | POA: Diagnosis not present

## 2021-12-02 DIAGNOSIS — C7802 Secondary malignant neoplasm of left lung: Secondary | ICD-10-CM

## 2021-12-02 NOTE — H&P (View-Only) (Signed)
Subjective:    Patient ID: [name] [name], male    DOB: 14-Jan-1956, 66 y.o.   MRN: [id number] Patient Care Team: Steele Sizer, MD as PCP - General (Family Medicine) Lloyd Huger, MD as Consulting Physician (Oncology) Abbie Sons, MD (Urology) Eulogio Bear, MD as Consulting Physician (Ophthalmology) Steele Sizer, MD as Attending Physician (Family Medicine) Tyler Pita, MD as Consulting Physician (Pulmonary Disease) Cardiovascular, [facility]  Chief Complaint  Patient presents with   Follow-up    Better. Some congestion. Cough with clear sputum in the morning. No SOB or wheezing.    HPI [name] [name] is a 66 year old lifelong never smoker, albeit uses chewing tobacco, who follows after evaluation of a recurrent cough.  We last evaluated the patient 17 October 2021.  Recall that the patient has a history of rectal carcinoma of the anal verge.  [name] [name] developed a left lung mass and required left upper lobectomy on February 03, 2019.  This was a lone metastatic deposit from adenocarcinoma.  After the procedure his cough had resolved.  However over the last year or so [name] [name] has noted recurrence of a dry cough.  No hemoptysis. No specific triggers. Nothing seemed to alleviate it.  [name] [name] also was noticing some mild shortness of breath when walking on inclines or when carrying a load.  This has been present since his surgery in 2021.  [name] [name] has not had any chest pain.  No lower extremity edema.  No calf tenderness.  No fevers, chills or sweats.  The patient underwent bronchoscopy on 28 October 2021 and it was noted that [name] [name] had what appeared to be granulation tissue at this site of his prior lobectomy with a ball-valve type of mechanism occluding his cell airway almost completely on exhalation.  [name] [name] underwent spray cryotherapy for ablation of this granulation tissue.  Since his procedure [name] [name] has noted that his cough has almost resolved.  [name] [name] no longer has shortness of breath.  [name] [name] presents today for  follow-up as [name] [name] will need reassessment to make sure cryotherapy is indeed working.  In addition biopsies were taking at the time of the procedure and there was a questionable sample where recurrent carcinoma could not be excluded however there were no cellular components associated with it just mucoid changes.  Since his initial bronchoscopy the patient has not as noted very well.  [name] [name] has not had any issues.  Overall [name] [name] feels well and looks well.    Review of Systems A 10 point review of systems was performed and it is as noted above otherwise negative.  Patient Active Problem List   Diagnosis Date Noted   Excessive granulation tissue    S/P lobectomy of lung 04/07/2021   Metastatic cancer to lung ([place]) 08/11/2020   Essential hypertension 08/11/2020   Dyslipidemia 08/11/2020   Spasm of muscle of lower back 08/11/2020   Peyronie's disease 10/15/2019   Tobacco use 10/15/2019   Glaucoma of right eye 03/22/2018   Erectile dysfunction 03/22/2018   Decreased vision of right eye 09/21/2017   Colostomy in place [facility]) 09/21/2017   Hypogonadism in male 09/21/2017   Perennial allergic rhinitis with seasonal variation    Degeneration of lumbar intervertebral disc 06/26/2017   Rectal cancer ([place]) 08/05/2014   Social History   Tobacco Use   Smoking status: Never   Smokeless tobacco: Current    Types: Chew   Tobacco comments:    might smoke a cigar once every 3 months.  No cigar use.  Still  using dip.  12/02/21 khj  Substance Use Topics   Alcohol use: Yes    Alcohol/week: 0.0 standard drinks of alcohol    Comment: RARE   Allergies  Allergen Reactions   Sulfa Antibiotics Hives   Morphine And Related Other (See Comments)    Altered mental status   Current Meds  Medication Sig   acetaminophen (TYLENOL) 500 MG tablet Take 1,000 mg by mouth every 6 (six) hours as needed for mild pain.   albuterol (VENTOLIN HFA) 108 (90 Base) MCG/ACT inhaler INHALE 2 PUFFS BY MOUTH INTO THE LUNGS EVERY 6  HOURS AS NEEDED FOR WHEEZING OR SHORTNESS OF BREATH   amLODipine (NORVASC) 10 MG tablet Take 1 tablet (10 mg total) by mouth daily.   calcium carbonate (TUMS - DOSED IN MG ELEMENTAL CALCIUM) 500 MG chewable tablet Chew 2 tablets by mouth as needed for indigestion or heartburn.   cetirizine (ZYRTEC) 10 MG tablet Take 10 mg by mouth at bedtime.    cyclobenzaprine (FLEXERIL) 10 MG tablet TAKE 1 TABLET BY MOUTH THREE TIMES A DAY AS NEEDED FOR MUSCLE SPASMS   loperamide (IMODIUM) 2 MG capsule Take 4 mg by mouth as needed for diarrhea or loose stools.    Multiple Vitamin (MULTIVITAMIN WITH MINERALS) TABS tablet Take 1 tablet by mouth every evening.    Naphazoline-Pheniramine (VISINE-A OP) Place 1 drop into both eyes daily as needed (allergies).   SYMBICORT 160-4.5 MCG/ACT inhaler Inhale 2 puffs into the lungs 2 (two) times daily.   SYRINGE-NEEDLE, DISP, 3 ML (LUER LOCK SAFETY SYRINGES) 21G X 1-1/2" 3 ML MISC Use as directed for testosterone administration   tadalafil (CIALIS) 20 MG tablet TAKE 1 TAB BY MOUTH 1 HOUR PRIOR TO INTERCOURSE AS NEEDED   testosterone cypionate (DEPOTESTOSTERONE CYPIONATE) 200 MG/ML injection INJECT 1 ML INTRAMUSCULARLY EVERY 14 DAYS   Immunization History  Administered Date(s) Administered   Fluad Quad(high Dose 65+) 10/12/2021   Influenza,inj,Quad PF,6+ Mos 03/06/2017, 09/21/2017, 11/28/2018, 10/15/2019   Influenza-Unspecified 11/17/2020   PFIZER(Purple Top)SARS-COV-2 Vaccination 03/10/2019, 04/09/2019   PNEUMOCOCCAL CONJUGATE-20 10/12/2021   Tdap 02/21/2016   Zoster Recombinat (Shingrix) 07/03/2016       Objective:   Physical Exam BP (!) 146/86 (BP Location: Left Arm, Cuff Size: Large)   Pulse 86   Temp 98 F (36.7 C)   Ht 6\' 3"  (1.905 m)   Wt 268 lb 9.6 oz (121.8 kg)   SpO2 96%   BMI 33.57 kg/m  GENERAL: Well-developed, well-nourished gentleman no acute distress, fully ambulatory, no conversational dyspnea.  Intermittently coughing during visit. HEAD:  Normocephalic, atraumatic.  EYES: Pupils equal, round, reactive to light.  No scleral icterus.  MOUTH: Oral mucosa moist.  No thrush. NECK: Supple. No thyromegaly. Trachea midline. No JVD.  No adenopathy. PULMONARY: Good air entry bilaterally.  No adventitious sounds. CARDIOVASCULAR: S1 and S2. Regular rate and rhythm.  No rubs, murmurs or gallops heard.  Port-A-Cath on left. ABDOMEN: Benign. MUSCULOSKELETAL: No joint deformity, no clubbing, no edema.  NEUROLOGIC: No overt focal deficit, no gait disturbance, speech is fluent. SKIN: Intact,warm,dry. PSYCH: Mood and behavior normal.      Assessment & Plan:     ICD-10-CM   1. Bronchial obstruction  J98.09    Granulation tissue post lobectomy Status post cryotherapy x1 (1/3 treatments) Scheduled for second treatment 23 December 2021    2. Malignant neoplasm metastatic to left lung [facility])  C78.02    Prior solitary metastasis to the left upper lobe Status post lobectomy  The patient has been scheduled for reevaluation of the area of granulation tissue at his suture line.  We will also repeat biopsy at that time due to the questionable findings on initial pathology.  Procedure has been scheduled for 1 December.  Patient will receive second stage of cryoablation to this area.  Will see the patient in follow-up in 6 weeks time, [name] [name] is to contact us prior to that time should any new difficulties arise.   Benefits, limitations and potential complications of the procedure were discussed with the patient/family.  Complications from bronchoscopy are rare and most often minor, but if they occur they may include breathing difficulty, vocal cord spasm, hoarseness, slight fever, vomiting, dizziness, bronchospasm, infection, low blood oxygen, bleeding from biopsy site, or an allergic reaction to medications.  It is uncommon for patients to experience other more serious complications for example: Collapsed lung requiring chest tube placement, respiratory  failure, heart attack and/or cardiac arrhythmia.  [name] [name] agrees to proceed.   Renold Don, MD Advanced Bronchoscopy PCCM [facility]    *This note was dictated using voice recognition software/Dragon.  Despite best efforts to proofread, errors can occur which can change the meaning. Any transcriptional errors that result from this process are unintentional and may not be fully corrected at the time of dictation.

## 2021-12-02 NOTE — Telephone Encounter (Signed)
Flexible Bronch 12/23/21 at 12:30pm Bronchial obstruction [address], [place], [place] please see bronch info

## 2021-12-02 NOTE — Telephone Encounter (Signed)
PreAdmit- 12/13/2021 8-1pm COVID: 12/21/2021 8-12pm  I have notified the patient.

## 2021-12-02 NOTE — Progress Notes (Signed)
Subjective:    Patient ID: [name] [name], male    DOB: 1955/07/07, 66 y.o.   MRN: [id number] Patient Care Team: Steele Sizer, MD as PCP - General (Family Medicine) Lloyd Huger, MD as Consulting Physician (Oncology) Abbie Sons, MD (Urology) Eulogio Bear, MD as Consulting Physician (Ophthalmology) Steele Sizer, MD as Attending Physician (Family Medicine) Tyler Pita, MD as Consulting Physician (Pulmonary Disease) Cardiovascular, [facility]  Chief Complaint  Patient presents with   Follow-up    Better. Some congestion. Cough with clear sputum in the morning. No SOB or wheezing.    HPI [name] is a 66 year old lifelong never smoker, albeit uses chewing tobacco, who follows after evaluation of a recurrent cough.  We last evaluated the patient 17 October 2021.  Recall that the patient has a history of rectal carcinoma of the anal verge.  He developed a left lung mass and required left upper lobectomy on February 03, 2019.  This was a lone metastatic deposit from adenocarcinoma.  After the procedure his cough had resolved.  However over the last year or so he has noted recurrence of a dry cough.  No hemoptysis. No specific triggers. Nothing seemed to alleviate it.  He also was noticing some mild shortness of breath when walking on inclines or when carrying a load.  This has been present since his surgery in 2021.  He has not had any chest pain.  No lower extremity edema.  No calf tenderness.  No fevers, chills or sweats.  The patient underwent bronchoscopy on 28 October 2021 and it was noted that he had what appeared to be granulation tissue at this site of his prior lobectomy with a ball-valve type of mechanism occluding his cell airway almost completely on exhalation.  He underwent spray cryotherapy for ablation of this granulation tissue.  Since his procedure he has noted that his cough has almost resolved.  He no longer has shortness of breath.  He presents today for  follow-up as he will need reassessment to make sure cryotherapy is indeed working.  In addition biopsies were taking at the time of the procedure and there was a questionable sample where recurrent carcinoma could not be excluded however there were no cellular components associated with it just mucoid changes.  Since his initial bronchoscopy the patient has not as noted very well.  He has not had any issues.  Overall he feels well and looks well.    Review of Systems A 10 point review of systems was performed and it is as noted above otherwise negative.  Patient Active Problem List   Diagnosis Date Noted   Excessive granulation tissue    S/P lobectomy of lung 04/07/2021   Metastatic cancer to lung ([place]) 08/11/2020   Essential hypertension 08/11/2020   Dyslipidemia 08/11/2020   Spasm of muscle of lower back 08/11/2020   Peyronie's disease 10/15/2019   Tobacco use 10/15/2019   Glaucoma of right eye 03/22/2018   Erectile dysfunction 03/22/2018   Decreased vision of right eye 09/21/2017   Colostomy in place [facility]) 09/21/2017   Hypogonadism in male 09/21/2017   Perennial allergic rhinitis with seasonal variation    Degeneration of lumbar intervertebral disc 06/26/2017   Rectal cancer ([place]) 08/05/2014   Social History   Tobacco Use   Smoking status: Never   Smokeless tobacco: Current    Types: Chew   Tobacco comments:    might smoke a cigar once every 3 months.  No cigar use.  Still  using dip.  12/02/21 khj  Substance Use Topics   Alcohol use: Yes    Alcohol/week: 0.0 standard drinks of alcohol    Comment: RARE   Allergies  Allergen Reactions   Sulfa Antibiotics Hives   Morphine And Related Other (See Comments)    Altered mental status   Current Meds  Medication Sig   acetaminophen (TYLENOL) 500 MG tablet Take 1,000 mg by mouth every 6 (six) hours as needed for mild pain.   albuterol (VENTOLIN HFA) 108 (90 Base) MCG/ACT inhaler INHALE 2 PUFFS BY MOUTH INTO THE LUNGS EVERY 6  HOURS AS NEEDED FOR WHEEZING OR SHORTNESS OF BREATH   amLODipine (NORVASC) 10 MG tablet Take 1 tablet (10 mg total) by mouth daily.   calcium carbonate (TUMS - DOSED IN MG ELEMENTAL CALCIUM) 500 MG chewable tablet Chew 2 tablets by mouth as needed for indigestion or heartburn.   cetirizine (ZYRTEC) 10 MG tablet Take 10 mg by mouth at bedtime.    cyclobenzaprine (FLEXERIL) 10 MG tablet TAKE 1 TABLET BY MOUTH THREE TIMES A DAY AS NEEDED FOR MUSCLE SPASMS   loperamide (IMODIUM) 2 MG capsule Take 4 mg by mouth as needed for diarrhea or loose stools.    Multiple Vitamin (MULTIVITAMIN WITH MINERALS) TABS tablet Take 1 tablet by mouth every evening.    Naphazoline-Pheniramine (VISINE-A OP) Place 1 drop into both eyes daily as needed (allergies).   SYMBICORT 160-4.5 MCG/ACT inhaler Inhale 2 puffs into the lungs 2 (two) times daily.   SYRINGE-NEEDLE, DISP, 3 ML (LUER LOCK SAFETY SYRINGES) 21G X 1-1/2" 3 ML MISC Use as directed for testosterone administration   tadalafil (CIALIS) 20 MG tablet TAKE 1 TAB BY MOUTH 1 HOUR PRIOR TO INTERCOURSE AS NEEDED   testosterone cypionate (DEPOTESTOSTERONE CYPIONATE) 200 MG/ML injection INJECT 1 ML INTRAMUSCULARLY EVERY 14 DAYS   Immunization History  Administered Date(s) Administered   Fluad Quad(high Dose 65+) 10/12/2021   Influenza,inj,Quad PF,6+ Mos 03/06/2017, 09/21/2017, 11/28/2018, 10/15/2019   Influenza-Unspecified 11/17/2020   PFIZER(Purple Top)SARS-COV-2 Vaccination 03/10/2019, 04/09/2019   PNEUMOCOCCAL CONJUGATE-20 10/12/2021   Tdap 02/21/2016   Zoster Recombinat (Shingrix) 07/03/2016       Objective:   Physical Exam BP (!) 146/86 (BP Location: Left Arm, Cuff Size: Large)   Pulse 86   Temp 98 F (36.7 C)   Ht 6\' 3"  (1.905 m)   Wt 268 lb 9.6 oz (121.8 kg)   SpO2 96%   BMI 33.57 kg/m  GENERAL: Well-developed, well-nourished gentleman no acute distress, fully ambulatory, no conversational dyspnea.  Intermittently coughing during visit. HEAD:  Normocephalic, atraumatic.  EYES: Pupils equal, round, reactive to light.  No scleral icterus.  MOUTH: Oral mucosa moist.  No thrush. NECK: Supple. No thyromegaly. Trachea midline. No JVD.  No adenopathy. PULMONARY: Good air entry bilaterally.  No adventitious sounds. CARDIOVASCULAR: S1 and S2. Regular rate and rhythm.  No rubs, murmurs or gallops heard.  Port-A-Cath on left. ABDOMEN: Benign. MUSCULOSKELETAL: No joint deformity, no clubbing, no edema.  NEUROLOGIC: No overt focal deficit, no gait disturbance, speech is fluent. SKIN: Intact,warm,dry. PSYCH: Mood and behavior normal.      Assessment & Plan:     ICD-10-CM   1. Bronchial obstruction  J98.09    Granulation tissue post lobectomy Status post cryotherapy x1 (1/3 treatments) Scheduled for second treatment 23 December 2021    2. Malignant neoplasm metastatic to left lung [facility])  C78.02    Prior solitary metastasis to the left upper lobe Status post lobectomy  The patient has been scheduled for reevaluation of the area of granulation tissue at his suture line.  We will also repeat biopsy at that time due to the questionable findings on initial pathology.  Procedure has been scheduled for 1 December.  Patient will receive second stage of cryoablation to this area.  Will see the patient in follow-up in 6 weeks time, he is to contact us prior to that time should any new difficulties arise.   Benefits, limitations and potential complications of the procedure were discussed with the patient/family.  Complications from bronchoscopy are rare and most often minor, but if they occur they may include breathing difficulty, vocal cord spasm, hoarseness, slight fever, vomiting, dizziness, bronchospasm, infection, low blood oxygen, bleeding from biopsy site, or an allergic reaction to medications.  It is uncommon for patients to experience other more serious complications for example: Collapsed lung requiring chest tube placement, respiratory  failure, heart attack and/or cardiac arrhythmia.  He agrees to proceed.   Renold Don, MD Advanced Bronchoscopy PCCM [facility]    *This note was dictated using voice recognition software/Dragon.  Despite best efforts to proofread, errors can occur which can change the meaning. Any transcriptional errors that result from this process are unintentional and may not be fully corrected at the time of dictation.

## 2021-12-02 NOTE — Patient Instructions (Addendum)
We have schedule you for your second procedure on 1 December which is a Friday.  Will be at 12:30 PM.  We will receive a call from the preadmission clinic with further details.  We will see you in follow-up in 6 weeks time.  Call sooner should any new problems arise.

## 2021-12-05 NOTE — Telephone Encounter (Signed)
For the codes 31628, [phone], [phone] Prior Auth Not Required Refer # Amie Critchley [id number]

## 2021-12-05 NOTE — Telephone Encounter (Signed)
Noted  

## 2021-12-07 DIAGNOSIS — H2513 Age-related nuclear cataract, bilateral: Secondary | ICD-10-CM | POA: Diagnosis not present

## 2021-12-07 DIAGNOSIS — H479 Unspecified disorder of visual pathways: Secondary | ICD-10-CM | POA: Diagnosis not present

## 2021-12-07 DIAGNOSIS — H02885 Meibomian gland dysfunction left lower eyelid: Secondary | ICD-10-CM | POA: Diagnosis not present

## 2021-12-07 DIAGNOSIS — H02882 Meibomian gland dysfunction right lower eyelid: Secondary | ICD-10-CM | POA: Diagnosis not present

## 2021-12-10 ENCOUNTER — Other Ambulatory Visit: Payer: Self-pay | Admitting: Family Medicine

## 2021-12-13 ENCOUNTER — Encounter
Admission: RE | Admit: 2021-12-13 | Discharge: 2021-12-13 | Disposition: A | Discharge status: Home / Self Care | Payer: Medicare HMO | Source: Ambulatory Visit | Attending: Pulmonary Disease | Admitting: Pulmonary Disease

## 2021-12-13 DIAGNOSIS — Z933 Colostomy status: Secondary | ICD-10-CM | POA: Diagnosis not present

## 2021-12-13 NOTE — Patient Instructions (Signed)
Covid testing is scheduled for Wednesday, November 29 at pre-admission testing department [facility])  Your procedure is scheduled on: Friday, December 1 Report to the Registration Desk on the 1st floor of the [facility]. To find out your arrival time, please call [phone] between 1PM - 3PM on: Thursday, November 30 If your arrival time is 6:00 am, do not arrive prior to that time as the [place] entrance doors do not open until 6:00 am.  REMEMBER: Instructions that are not followed completely may result in serious medical risk, up to and including death; or upon the discretion of your surgeon and anesthesiologist your surgery may need to be rescheduled.  Do not eat or drink after midnight the night before surgery.  No gum chewing, lozengers or hard candies.  TAKE THESE MEDICATIONS THE MORNING OF SURGERY WITH A SIP OF WATER:  Amlodipine Symbicort inhaler  Use inhalers on the day of surgery and bring your albuterol inhaler to the hospital.  One week prior to surgery: starting November 24 Stop Anti-inflammatories (NSAIDS) such as Advil, Aleve, Ibuprofen, Motrin, Naproxen, Naprosyn and Aspirin based products such as Excedrin, Goodys Powder, BC Powder. Stop ANY OVER THE COUNTER supplements until after surgery. Stop TUMS, multiple vitamins. You may however, continue to take Tylenol if needed for pain up until the day of surgery.  No Alcohol for 24 hours before or after surgery.  No Smoking including e-cigarettes for 24 hours prior to surgery.  No chewable tobacco products for at least 6 hours prior to surgery.  No nicotine patches on the day of surgery.  Do not use any "recreational" drugs for at least a week prior to your surgery.  Please be advised that the combination of cocaine and anesthesia may have negative outcomes, up to and including death. If you test positive for cocaine, your surgery will be cancelled.  On the morning of surgery brush your teeth with toothpaste and  water, you may rinse your mouth with mouthwash if you wish. Do not swallow any toothpaste or mouthwash.  Do not wear jewelry, make-up, hairpins, clips or nail polish.  Do not wear lotions, powders, or perfumes.   Contact lenses, hearing aids and dentures may not be worn into surgery.  Do not bring valuables to the hospital. [facility] is not responsible for any missing/lost belongings or valuables.   Notify your doctor if there is any change in your medical condition (cold, fever, infection).  Wear comfortable clothing (specific to your surgery type) to the hospital.  If you are being discharged the day of surgery, you will not be allowed to drive home. You will need a responsible adult (18 years or older) to drive you home and stay with you that night.   If you are taking public transportation, you will need to have a responsible adult (18 years or older) with you. Please confirm with your physician that it is acceptable to use public transportation.   Please call the [facility] at [phone] if you have any questions about these instructions.  Surgery Visitation Policy:  Patients undergoing a surgery or procedure may have two family members or support persons with them as long as the person is not COVID-19 positive or experiencing its symptoms.

## 2021-12-14 ENCOUNTER — Encounter: Payer: Self-pay | Admitting: Pulmonary Disease

## 2021-12-21 ENCOUNTER — Encounter
Admission: RE | Admit: 2021-12-21 | Discharge: 2021-12-21 | Disposition: A | Discharge status: Home / Self Care | Payer: Medicare HMO | Source: Ambulatory Visit | Attending: Pulmonary Disease | Admitting: Pulmonary Disease

## 2021-12-21 DIAGNOSIS — Z1152 Encounter for screening for COVID-19: Secondary | ICD-10-CM | POA: Insufficient documentation

## 2021-12-21 DIAGNOSIS — Z01812 Encounter for preprocedural laboratory examination: Secondary | ICD-10-CM | POA: Diagnosis not present

## 2021-12-21 MED ORDER — PROPOFOL 1000 MG/100ML IV EMUL
INTRAVENOUS | Status: AC
  Filled 2021-12-21: qty 100

## 2021-12-21 MED ORDER — EPHEDRINE 5 MG/ML INJ
INTRAVENOUS | Status: AC
  Filled 2021-12-21: qty 5

## 2021-12-22 LAB — SARS CORONAVIRUS 2 (TAT 6-24 HRS): SARS Coronavirus 2: NEGATIVE

## 2021-12-23 ENCOUNTER — Ambulatory Visit: Payer: Medicare HMO | Admitting: Anesthesiology

## 2021-12-23 ENCOUNTER — Other Ambulatory Visit: Payer: Self-pay

## 2021-12-23 ENCOUNTER — Ambulatory Visit: Payer: Medicare HMO

## 2021-12-23 ENCOUNTER — Encounter: Payer: Self-pay | Admitting: Pulmonary Disease

## 2021-12-23 ENCOUNTER — Ambulatory Visit
Admission: RE | Admit: 2021-12-23 | Discharge: 2021-12-23 | Disposition: A | Discharge status: Home / Self Care | Payer: Medicare HMO | Attending: Pulmonary Disease | Admitting: Pulmonary Disease

## 2021-12-23 ENCOUNTER — Encounter
Admission: RE | Disposition: A | Discharge status: Home / Self Care | Payer: Self-pay | Source: Home / Self Care | Attending: Pulmonary Disease

## 2021-12-23 DIAGNOSIS — J9809 Other diseases of bronchus, not elsewhere classified: Secondary | ICD-10-CM | POA: Diagnosis not present

## 2021-12-23 DIAGNOSIS — R69 Illness, unspecified: Secondary | ICD-10-CM | POA: Diagnosis not present

## 2021-12-23 DIAGNOSIS — L929 Granulomatous disorder of the skin and subcutaneous tissue, unspecified: Secondary | ICD-10-CM | POA: Diagnosis not present

## 2021-12-23 DIAGNOSIS — Z902 Acquired absence of lung [part of]: Secondary | ICD-10-CM | POA: Diagnosis not present

## 2021-12-23 DIAGNOSIS — I10 Essential (primary) hypertension: Secondary | ICD-10-CM | POA: Diagnosis not present

## 2021-12-23 DIAGNOSIS — J410 Simple chronic bronchitis: Secondary | ICD-10-CM | POA: Diagnosis not present

## 2021-12-23 DIAGNOSIS — F17220 Nicotine dependence, chewing tobacco, uncomplicated: Secondary | ICD-10-CM | POA: Insufficient documentation

## 2021-12-23 DIAGNOSIS — Z85048 Personal history of other malignant neoplasm of rectum, rectosigmoid junction, and anus: Secondary | ICD-10-CM | POA: Diagnosis not present

## 2021-12-23 DIAGNOSIS — Z87891 Personal history of nicotine dependence: Secondary | ICD-10-CM | POA: Diagnosis not present

## 2021-12-23 DIAGNOSIS — C7802 Secondary malignant neoplasm of left lung: Secondary | ICD-10-CM | POA: Diagnosis not present

## 2021-12-23 DIAGNOSIS — J811 Chronic pulmonary edema: Secondary | ICD-10-CM | POA: Diagnosis not present

## 2021-12-23 HISTORY — PX: FLEXIBLE BRONCHOSCOPY: SHX5094

## 2021-12-23 SURGERY — BRONCHOSCOPY, FLEXIBLE
Anesthesia: General | Laterality: Left

## 2021-12-23 MED ORDER — FAMOTIDINE 20 MG PO TABS: 20.0000 mg | ORAL_TABLET | Freq: Once | ORAL | Status: AC

## 2021-12-23 MED ORDER — PROPOFOL 10 MG/ML IV BOLUS
INTRAVENOUS | Status: DC | PRN
  Administered 2021-12-23: 150 mg via INTRAVENOUS

## 2021-12-23 MED ORDER — IPRATROPIUM-ALBUTEROL 0.5-2.5 (3) MG/3ML IN SOLN
RESPIRATORY_TRACT | Status: AC
  Filled 2021-12-23: qty 3

## 2021-12-23 MED ORDER — PROPOFOL 10 MG/ML IV BOLUS
INTRAVENOUS | Status: AC
  Filled 2021-12-23: qty 20

## 2021-12-23 MED ORDER — FENTANYL CITRATE (PF) 100 MCG/2ML IJ SOLN
INTRAMUSCULAR | Status: AC
  Filled 2021-12-23: qty 2

## 2021-12-23 MED ORDER — FENTANYL CITRATE (PF) 100 MCG/2ML IJ SOLN: 25.0000 ug | INTRAMUSCULAR | Status: DC | PRN

## 2021-12-23 MED ORDER — SUGAMMADEX SODIUM 200 MG/2ML IV SOLN
INTRAVENOUS | Status: DC | PRN
  Administered 2021-12-23: 243.2 mg via INTRAVENOUS

## 2021-12-23 MED ORDER — FAMOTIDINE 20 MG PO TABS
ORAL_TABLET | ORAL | Status: AC
  Administered 2021-12-23: 20 mg via ORAL
  Filled 2021-12-23: qty 1

## 2021-12-23 MED ORDER — ONDANSETRON HCL 4 MG/2ML IJ SOLN
INTRAMUSCULAR | Status: AC
  Filled 2021-12-23: qty 2

## 2021-12-23 MED ORDER — LIDOCAINE HCL (CARDIAC) PF 100 MG/5ML IV SOSY
PREFILLED_SYRINGE | INTRAVENOUS | Status: DC | PRN
  Administered 2021-12-23: 100 mg via INTRAVENOUS

## 2021-12-23 MED ORDER — OXYCODONE HCL 5 MG PO TABS: 5.0000 mg | ORAL_TABLET | Freq: Once | ORAL | Status: DC | PRN

## 2021-12-23 MED ORDER — SODIUM CHLORIDE 0.9 % IV SOLN: Freq: Once | INTRAVENOUS | Status: DC

## 2021-12-23 MED ORDER — FENTANYL CITRATE (PF) 100 MCG/2ML IJ SOLN
INTRAMUSCULAR | Status: DC | PRN
  Administered 2021-12-23: 50 ug via INTRAVENOUS

## 2021-12-23 MED ORDER — CHLORHEXIDINE GLUCONATE 0.12 % MT SOLN: 15.0000 mL | Freq: Once | OROMUCOSAL | Status: AC

## 2021-12-23 MED ORDER — DEXAMETHASONE SODIUM PHOSPHATE 10 MG/ML IJ SOLN
INTRAMUSCULAR | Status: AC
  Filled 2021-12-23: qty 1

## 2021-12-23 MED ORDER — LACTATED RINGERS IV SOLN: INTRAVENOUS | Status: DC

## 2021-12-23 MED ORDER — SEVOFLURANE IN SOLN
RESPIRATORY_TRACT | Status: AC
  Filled 2021-12-23: qty 250

## 2021-12-23 MED ORDER — CHLORHEXIDINE GLUCONATE 0.12 % MT SOLN
OROMUCOSAL | Status: AC
  Administered 2021-12-23: 15 mL via OROMUCOSAL
  Filled 2021-12-23: qty 15

## 2021-12-23 MED ORDER — HEPARIN SOD (PORK) LOCK FLUSH 100 UNIT/ML IV SOLN: 500.0000 [IU] | INTRAVENOUS | Status: AC | PRN

## 2021-12-23 MED ORDER — MIDAZOLAM HCL 2 MG/2ML IJ SOLN
INTRAMUSCULAR | Status: DC | PRN
  Administered 2021-12-23: 2 mg via INTRAVENOUS

## 2021-12-23 MED ORDER — ONDANSETRON HCL 4 MG/2ML IJ SOLN
INTRAMUSCULAR | Status: DC | PRN
  Administered 2021-12-23: 4 mg via INTRAVENOUS

## 2021-12-23 MED ORDER — HEPARIN SOD (PORK) LOCK FLUSH 100 UNIT/ML IV SOLN
INTRAVENOUS | Status: AC
  Administered 2021-12-23: 500 [IU]
  Filled 2021-12-23: qty 5

## 2021-12-23 MED ORDER — IPRATROPIUM-ALBUTEROL 0.5-2.5 (3) MG/3ML IN SOLN
3.0000 mL | Freq: Once | RESPIRATORY_TRACT | Status: AC
  Administered 2021-12-23: 3 mL via RESPIRATORY_TRACT

## 2021-12-23 MED ORDER — CHLORHEXIDINE GLUCONATE CLOTH 2 % EX PADS: 6.0000 | MEDICATED_PAD | Freq: Every day | CUTANEOUS | Status: DC

## 2021-12-23 MED ORDER — OXYCODONE HCL 5 MG/5ML PO SOLN: 5.0000 mg | Freq: Once | ORAL | Status: DC | PRN

## 2021-12-23 MED ORDER — ORAL CARE MOUTH RINSE: 15.0000 mL | Freq: Once | OROMUCOSAL | Status: AC

## 2021-12-23 MED ORDER — DEXAMETHASONE SODIUM PHOSPHATE 10 MG/ML IJ SOLN
INTRAMUSCULAR | Status: DC | PRN
  Administered 2021-12-23: 10 mg via INTRAVENOUS

## 2021-12-23 MED ORDER — ROCURONIUM BROMIDE 100 MG/10ML IV SOLN
INTRAVENOUS | Status: DC | PRN
  Administered 2021-12-23: 10 mg via INTRAVENOUS
  Administered 2021-12-23: 50 mg via INTRAVENOUS

## 2021-12-23 MED ORDER — MIDAZOLAM HCL 2 MG/2ML IJ SOLN
INTRAMUSCULAR | Status: AC
  Filled 2021-12-23: qty 2

## 2021-12-23 NOTE — Anesthesia Preprocedure Evaluation (Signed)
Anesthesia Evaluation  Patient identified by MRN, date of birth, ID band Patient awake    Reviewed: Allergy & Precautions, NPO status , Patient's Chart, lab work & pertinent test results  History of Anesthesia Complications Negative for: history of anesthetic complications  Airway Mallampati: III  TM Distance: >3 FB Neck ROM: full    Dental  (+) Chipped   Pulmonary shortness of breath and with exertion, former smoker    + decreased breath sounds      Cardiovascular hypertension, (-) angina Normal cardiovascular exam     Neuro/Psych  Neuromuscular disease  negative psych ROS   GI/Hepatic negative GI ROS, Neg liver ROS,,,  Endo/Other  negative endocrine ROS    Renal/GU      Musculoskeletal   Abdominal   Peds  Hematology negative hematology ROS (+)   Anesthesia Other Findings Past Medical History: No date: Anemia No date: Arthritis 2015: Cancer ([place])     Comment:  rectal, followed by Oncology, permanent colostomy 01/2019: Cancer of upper lobe of left lung (HCC) No date: Colostomy in place [facility]) No date: Dyspnea     Comment:  on exertion No date: History of kidney stones No date: Hypertension 01/2019: Mass of upper lobe of left lung No date: Neuromuscular disorder ([place]) No date: Pneumonia 2015: Rectal cancer ([place]) No date: Seasonal allergies  Past Surgical History: No date: BACK SURGERY No date: CHOLECYSTECTOMY No date: COLON SURGERY 07/01/2018: ENDOBRONCHIAL ULTRASOUND; N/A     Comment:  Procedure: ENDOBRONCHIAL ULTRASOUND;  Surgeon: Tyler Pita, MD;  Location: [facility];  Service:               Cardiopulmonary;  Laterality: N/A; 11/20/2018: FLEXIBLE BRONCHOSCOPY; Left     Comment:  Procedure: FLEXIBLE BRONCHOSCOPY;  Surgeon: Tyler Pita, MD;  Location: [facility];  Service:               Cardiopulmonary;  Laterality: Left; 02/03/2019: INTERCOSTAL NERVE BLOCK;  Left     Comment:  Procedure: INTERCOSTAL NERVE BLOCK WITH EXPAREL;                Surgeon: Grace Isaac, MD;  Location: [place];                Service: Thoracic;  Laterality: Left; No date: KNEE SURGERY; Bilateral No date: laceration of wrist; Right 02/03/2019: LOBECTOMY; Left     Comment:  Procedure: LEFT UPPER LOBECTOMY WITH LYMPH NODE               DISSECTION;  Surgeon: Grace Isaac, MD;  Location:               [place];  Service: Thoracic;  Laterality: Left; 08/05/2018: PORT A CATH REVISION; Left     Comment:  Procedure: PORT A CATH REVISION, REPOSITION LEFT;                Surgeon: Robert Bellow, MD;  Location: [facility];                Service: General;  Laterality: Left; 12/02/2014: PORT-A-CATH REMOVAL; Right     Comment:  Procedure: REMOVAL PORT-A-CATH;  Surgeon: Marlyce Huge, MD;  Location: [facility];  Service: General;  Laterality: Right; No date: PORTACATH PLACEMENT 07/29/2018: PORTACATH PLACEMENT; Left     Comment:  Procedure: INSERTION PORT-A-CATH LEFT;  Surgeon:               Robert Bellow, MD;  Location: [facility];  Service:               General;  Laterality: Left; 04/30/2013: RECTAL BIOPSY 02/03/2019: THORACOTOMY/LOBECTOMY; Left     Comment:  Procedure: MINITHORACOTOMY;  Surgeon: Grace Isaac, MD;  Location: [place];  Service: Thoracic;  Laterality:              Left; 02/03/2019: VIDEO ASSISTED THORACOSCOPY (VATS)/WEDGE RESECTION; Left     Comment:  Procedure: VIDEO ASSISTED THORACOSCOPY (VATS)/LUNG               RESECTION;  Surgeon: Grace Isaac, MD;  Location:               [facility] OR;  Service: Thoracic;  Laterality: Left; 02/03/2019: VIDEO BRONCHOSCOPY; N/A     Comment:  Procedure: VIDEO BRONCHOSCOPY;  Surgeon: Grace Isaac, MD;  Location: [facility] OR;  Service: Thoracic;                Laterality: N/A; 10/28/2021: VIDEO BRONCHOSCOPY; N/A     Comment:  Procedure: VIDEO  BRONCHOSCOPY WITH CRYO and DEBULKING;                Surgeon: Tyler Pita, MD;  Location: [facility];                Service: Cardiopulmonary;  Laterality: N/A; 12/30/2018: VIDEO BRONCHOSCOPY WITH ENDOBRONCHIAL ULTRASOUND; N/A     Comment:  Procedure: VIDEO BRONCHOSCOPY WITH ENDOBRONCHIAL               ULTRASOUND;  Surgeon: Grace Isaac, MD;  Location:               [place];  Service: Thoracic;  Laterality: N/A; 10/09/2013: XI ROBOT ABDOMINAL PERINEAL RESECTION  BMI    Body Mass Index: 33.51 kg/m      Reproductive/Obstetrics negative OB ROS                             Anesthesia Physical Anesthesia Plan  ASA: 3  Anesthesia Plan: General ETT   Post-op Pain Management:    Induction: Intravenous  PONV Risk Score and Plan: Ondansetron, Dexamethasone, Midazolam and Treatment may vary due to age or medical condition  Airway Management Planned: Oral ETT  Additional Equipment:   Intra-op Plan:   Post-operative Plan: Extubation in OR  Informed Consent: I have reviewed the patients History and Physical, chart, labs and discussed the procedure including the risks, benefits and alternatives for the proposed anesthesia with the patient or authorized representative who has indicated his/her understanding and acceptance.     Dental Advisory Given  Plan Discussed with: Anesthesiologist, CRNA and Surgeon  Anesthesia Plan Comments: (Patient consented for risks of anesthesia including but not limited to:  - adverse reactions to medications - damage to eyes, teeth, lips or other oral mucosa - nerve damage due to positioning  - sore throat or hoarseness - Damage to heart, brain, nerves, lungs, other parts of body or loss of life  Patient voiced understanding.)  Anesthesia Quick Evaluation  

## 2021-12-23 NOTE — Interval H&P Note (Signed)
[name] [name] has presented today for surgery, with the diagnosis of BRONCHIAL OBSTRUCTION.  The various methods of treatment have been discussed with the patient and family. After consideration of risks, benefits and other options for treatment, the patient has consented to  Procedure(s): BRONCHOSCOPY WITH CRYOABLATION-LEFT as a surgical intervention.  The patient's history has been reviewed, patient examined, no change in status, stable for surgery.  I have reviewed the patient's chart and labs.  Questions were answered to the patient's satisfaction.  Benefits, limitations and potential complications of the procedure were discussed with the patient/family.  Complications from bronchoscopy are rare and most often minor, but if they occur they may include breathing difficulty, vocal cord spasm, hoarseness, slight fever, vomiting, dizziness, bronchospasm, infection, low blood oxygen, bleeding from biopsy site, or an allergic reaction to medications.  It is uncommon for patients to experience other more serious complications for example: Collapsed lung requiring chest tube placement, respiratory failure, heart attack and/or cardiac arrhythmia.  Patient agrees to proceed.   Renold Don, MD Advanced Bronchoscopy PCCM [facility]    *This note was dictated using voice recognition software/Dragon.  Despite best efforts to proofread, errors can occur which can change the meaning. Any transcriptional errors that result from this process are unintentional and may not be fully corrected at the time of dictation.

## 2021-12-23 NOTE — Op Note (Signed)
PROCEDURES:  Bronchoscopy with biopsies Bronchoscopic spray cryotherapy for tissue ablation Endobronchial balloon dilation   PROCEDURE DATE: 12/23/2021           TIME: 13:00HRS NAME:  [name] [name]          DOB:[birth year]                     MRN: [id number] LOC:  [facility]/None                 HOSP DAY: N/A     Indications/Preliminary Diagnosis: Patient is a 66 year old with a history of metastatic colon CA to the lung and status post left upper lobectomy for lone metastasis in the lung.  That is post bronchoscopy in 28 October 2021 revealing complete obstruction of the left mainstem bronchus by granulation tissue arising from the suture line from the left upper lobectomy deviously performed.  The granulation tissue was functioning in a "ball valve "mechanism".  Patient had biopsies and cryoablation performed during that procedure.  Pathology requested rebiopsy to 1 concerning sample showing mucoid material but no cellular material.  This is also a follow-up treatment with cryoablation. Preoperative diagnosis: Bronchial obstruction due to granulation tissue from prior lobectomy "Ball-valve mechanism" obstruction left mainstem bronchus Equivocal prior biopsy     Consent: (Place X beside choice/s below)   The benefits, risks and possible complications of the procedure were        explained to:  _X__ patient  _X__ patient's family  ___ other:___________  who verbalized understanding and gave:  ___ verbal  ___ written  _X__ verbal and written  ___ telephone  ___ other:________ consent.        Unable to obtain consent; procedure performed on emergent basis.      Other:     Benefits, limitations and potential complications of the procedure were discussed with the patient/family.  Complications from bronchoscopy are rare and most often minor, but if they occur they may include breathing difficulty, vocal cord spasm, hoarseness, slight fever, vomiting, dizziness, bronchospasm, infection, low  blood oxygen, bleeding from biopsy site, or an allergic reaction to medications.  It is uncommon for patients to experience other more serious complications for example: Collapsed lung requiring chest tube placement, respiratory failure, heart attack and/or cardiac arrhythmia.  Patient agrees to proceed.     Anesthesia type: General endotracheal   Surgeon: Renold Don, MD Assistant/Scrub: Sullivan Lone, RRT Circulator: Liborio Nixon, RRT Anesthesiologist/CRNA: Katy Fitch, MD/[place] Malvin Johns, CRNA Cytotechnology: [facility]     PROCEDURE DETAILS: Patient was taken to Procedure Room 2 (Bronchoscopy Suite) in the OR area.  Appropriate Timeout performed and correct patient, name, ID and laterality confirmed.  Patient was inducted under general anesthesia and intubated with an 9.0 ET tube without difficulty.  Once the patient was under adequate general anesthesia a Portex adapter was placed in the ET tube flange.  Through the Portex adapter the Olympus video therapeutic bronchoscope was then advanced.  The visible distal trachea was normal, carina appeared sharp. The right upper lobe , right middle lobe, lower lobe bronchi were free of endobronchial lesions or any other abnormality.  At this point bronchoscope was brought to the left mainstem and examination revealed persistent "ball-valve mechanism" occlusion of the left mainstem bronchus by tissue protruding from a left upper lobectomy suture line.  This tissue however appears to be less prominent than previous.  The area of obstruction was bypassed and examination with the bronchoscope of the lower lobe  subsegments showed him to be patent and clear of secretions.  At this point 6 endobronchial biopsies were performed of this granulation tissue.  Patient then received 1-10,000 solution of epinephrine, 3 mL via bronchial wash.  For hemostasis.  Once this was performed I proceeded to perform 3 initial cycles of cryoablation with a truFreeze spray  cryotherapy catheter.  A total of 3 cycles of 5-second freeze were performed on the tissue.  The patient was appropriately passively vented by deflating the ET tube cuff and disconnecting from the circuit, breath-holding per anesthesia.  After the appropriate freezing time was completed a the airway was then examined thoroughly to ensure adequate hemostasis.  At this point utilizing a [employer] CRE pulmonary balloon the patient had the area of stricture dilated stepwise fashion beginning at 8 mm to ending at 10 mm each dilation less than 30 seconds.  Following balloon dilation another 3 cycles of 5 seconds for a cryotherapy with truFreeze were performed.  The patient was allowed to passively vent as described previously by deflating ET tube balloon, disengaging ET tube flange and breath-holding during the procedure.  The patient was allowed to equilibrate between freezing cycles.  After examination and inspection of the airway and noting excellent hemostasis, the patient received 9 mL of 1% lidocaine via bronchial lavage prior to retrieving the bronchoscope.  At this point the procedure was terminated and the patient was allowed to emerge from general anesthesia. Patient was extubated in the procedure room and transported to the PACU in satisfactory condition.  The patient did not have any complaints of chest pain or shortness of breath postprocedure.  Postprocedure examination shows symmetrical lung sounds.  Postprocedure chest x-ray showed no pneumothorax.  Patient tolerated the procedure well.     SPECIMENS (Sites): (Place X beside choice below)   Specimens Description    No Specimens Obtained      Washings     Lavage   X Biopsies X6, left upper lobe suture line/mainstem   Fine Needle Aspirates    Brushings     Sputum      FINDINGS:    Granulation tissue at the area of prior suture line from left upper lobectomy left mainstem bronchus lumen slightly more open than previous.  This is an  inspiratory image:   Exhalation photo, granulation tissue working as a Programmer, systems" mechanism.  The lumen occludes further however does not occlude completely as noted on prior bronchoscopy:   View of left mainstem lumen after spray cryotherapy and balloon dilation:   Lumen remains open even on exhalation:   During cryotherapy:   ESTIMATED BLOOD LOSS: Less than 2 mL     COMPLICATIONS/RESOLUTION: none, post procedure chest x-ray showed no pneumothorax:      IMPRESSION:POST-PROCEDURE DX:    Obstruction of the left mainstem bronchus due to granulation tissueon the left upper lobe suture line.  Status post biopsies, from granulation tissue  Status post cryoablation of tissue Status post endobronchial balloon dilation   RECOMMENDATION/PLAN:    Follow-up pathology reports Patient has appropriate pulmonary/oncology appointments         C. Derrill Kay, MD Advanced Bronchoscopy PCCM [facility]    *This note was dictated using voice recognition software/Dragon.  Despite best efforts to proofread, errors can occur which can change the meaning.  Any change was purely unintentional.

## 2021-12-23 NOTE — Transfer of Care (Signed)
Immediate Anesthesia Transfer of Care Note  Patient: [name] [name]  Procedure(s) Performed: FLEXIBLE BRONCHOSCOPY (Left)  Patient Location: PACU  Anesthesia Type:General  Level of Consciousness: oriented and drowsy  Airway & Oxygen Therapy: Patient Spontanous Breathing  Post-op Assessment: Report given to RN and Post -op Vital signs reviewed and stable  Post vital signs: Reviewed and stable  Last Vitals:  Vitals Value Taken Time  BP 153/86 12/23/21 1350  Temp 36 C 12/23/21 1350  Pulse 67 12/23/21 1354  Resp 15 12/23/21 1354  SpO2 95 % 12/23/21 1354  Vitals shown include unvalidated device data.  Last Pain:  Vitals:   12/23/21 1350  TempSrc:   PainSc: Asleep         Complications: No notable events documented.

## 2021-12-23 NOTE — Discharge Instructions (Signed)

## 2021-12-25 NOTE — Anesthesia Postprocedure Evaluation (Signed)
Anesthesia Post Note  Patient: [name] [name]  Procedure(s) Performed: FLEXIBLE BRONCHOSCOPY (Left)  Patient location during evaluation: PACU Anesthesia Type: General Level of consciousness: awake and alert Pain management: pain level controlled Vital Signs Assessment: post-procedure vital signs reviewed and stable Respiratory status: spontaneous breathing, nonlabored ventilation, respiratory function stable and patient connected to nasal cannula oxygen Cardiovascular status: blood pressure returned to baseline and stable Postop Assessment: no apparent nausea or vomiting Anesthetic complications: no   No notable events documented.   Last Vitals:  Vitals:   12/23/21 1445 12/23/21 1450  BP:  (!) 155/76  Pulse: 73 73  Resp: (!) 24 18  Temp:  (!) 36.3 C  SpO2: (!) 88% 100%    Last Pain:  Vitals:   12/23/21 1450  TempSrc: Temporal  PainSc: 0-No pain                 Precious Haws Perris Conwell

## 2021-12-26 ENCOUNTER — Encounter: Payer: Self-pay | Admitting: Pulmonary Disease

## 2021-12-26 LAB — SURGICAL PATHOLOGY

## 2021-12-31 ENCOUNTER — Encounter: Payer: Self-pay | Admitting: Family Medicine

## 2022-01-02 ENCOUNTER — Other Ambulatory Visit: Payer: Self-pay | Admitting: Family Medicine

## 2022-01-02 MED ORDER — SYMBICORT 160-4.5 MCG/ACT IN AERO: 2.0000 | INHALATION_SPRAY | Freq: Two times a day (BID) | RESPIRATORY_TRACT | 2 refills | Status: DC

## 2022-01-09 ENCOUNTER — Other Ambulatory Visit: Payer: Self-pay | Admitting: Family Medicine

## 2022-01-10 ENCOUNTER — Other Ambulatory Visit: Payer: Self-pay

## 2022-01-10 NOTE — Telephone Encounter (Signed)
Requested medication (s) are due for refill today - yes  Requested medication (s) are on the active medication list -yes  Future visit scheduled -yes  Last refill: 12/11/21 #90   Notes to clinic: non delegated Rx  Requested Prescriptions  Pending Prescriptions Disp Refills   cyclobenzaprine (FLEXERIL) 10 MG tablet [Pharmacy Med Name: CYCLOBENZAPRINE 10 MG TABLET] 90 tablet 0    Sig: TAKE 1 TABLET BY MOUTH THREE TIMES A DAY AS NEEDED FOR MUSCLE SPASM     Not Delegated - Analgesics:  Muscle Relaxants Failed - 01/09/2022  6:47 PM      Failed - This refill cannot be delegated      Passed - Valid encounter within last 6 months    Recent Outpatient Visits           3 months ago Chemotherapy-induced neuropathy [facility])   [facility] Steele Sizer, MD   9 months ago Essential hypertension   [facility] Steele Sizer, MD   10 months ago [place], NP   1 year ago Essential hypertension   [facility] [place], Drue Stager, MD   2 years ago Spasm of muscle of lower back   [facility] Towanda Malkin, MD       Future Appointments             In 3 weeks Steele Sizer, MD [facility], [place]   In 2 months Grayland Ormond, Kathlene November, MD [facility] [place]   In 6 months Steele Sizer, MD [facility], [facility]               Requested Prescriptions  Pending Prescriptions Disp Refills   cyclobenzaprine (FLEXERIL) 10 MG tablet [Pharmacy Med Name: CYCLOBENZAPRINE 10 MG TABLET] 90 tablet 0    Sig: TAKE 1 TABLET BY MOUTH THREE TIMES A DAY AS NEEDED FOR MUSCLE SPASM     Not Delegated - Analgesics:  Muscle Relaxants Failed - 01/09/2022  6:47 PM      Failed - This refill cannot be delegated      Passed - Valid encounter within last 6 months    Recent Outpatient Visits           3 months ago  Chemotherapy-induced neuropathy [facility])   [facility] Steele Sizer, MD   9 months ago Essential hypertension   [facility] Steele Sizer, MD   10 months ago [facility] Kathrine Haddock, NP   1 year ago Essential hypertension   [facility] [place], Drue Stager, MD   2 years ago Spasm of muscle of lower back   [place], MD       Future Appointments             In 3 weeks Steele Sizer, MD [facility], [place]   In 2 months Grayland Ormond, Kathlene November, MD [place] [facility] [place]   In 6 months Steele Sizer, MD [facility], [facility]

## 2022-01-13 ENCOUNTER — Encounter: Payer: Self-pay | Admitting: Pulmonary Disease

## 2022-01-13 DIAGNOSIS — Z933 Colostomy status: Secondary | ICD-10-CM | POA: Diagnosis not present

## 2022-01-18 ENCOUNTER — Telehealth: Payer: Self-pay

## 2022-01-18 NOTE — Telephone Encounter (Signed)
Lm for patient to request that he come in at 8:30 on 02/15/22 vs 1:30. LG has bronch that afternoon.

## 2022-01-24 ENCOUNTER — Ambulatory Visit: Payer: Medicare HMO | Admitting: Pulmonary Disease

## 2022-01-24 ENCOUNTER — Other Ambulatory Visit: Payer: Self-pay | Admitting: *Deleted

## 2022-01-24 DIAGNOSIS — E291 Testicular hypofunction: Secondary | ICD-10-CM

## 2022-01-24 DIAGNOSIS — Z125 Encounter for screening for malignant neoplasm of prostate: Secondary | ICD-10-CM

## 2022-01-29 ENCOUNTER — Encounter: Payer: Self-pay | Admitting: Family Medicine

## 2022-02-02 ENCOUNTER — Encounter: Payer: Medicare HMO | Admitting: Family Medicine

## 2022-02-09 ENCOUNTER — Other Ambulatory Visit: Payer: Medicare HMO

## 2022-02-11 ENCOUNTER — Other Ambulatory Visit: Payer: Self-pay | Admitting: Family Medicine

## 2022-02-15 ENCOUNTER — Ambulatory Visit: Payer: Medicare HMO | Admitting: Pulmonary Disease

## 2022-02-16 ENCOUNTER — Encounter: Payer: Self-pay | Admitting: Pulmonary Disease

## 2022-02-16 ENCOUNTER — Ambulatory Visit (INDEPENDENT_AMBULATORY_CARE_PROVIDER_SITE_OTHER): Payer: PPO | Admitting: Pulmonary Disease

## 2022-02-16 VITALS — BP 138/70 | HR 100 | Temp 98.3°F | Ht 75.0 in | Wt 271.6 lb

## 2022-02-16 DIAGNOSIS — J9809 Other diseases of bronchus, not elsewhere classified: Secondary | ICD-10-CM | POA: Diagnosis not present

## 2022-02-16 DIAGNOSIS — C7802 Secondary malignant neoplasm of left lung: Secondary | ICD-10-CM | POA: Diagnosis not present

## 2022-02-16 DIAGNOSIS — R0602 Shortness of breath: Secondary | ICD-10-CM

## 2022-02-16 NOTE — Patient Instructions (Addendum)
We will hold off on the bronchoscopy for now given your current schedule and the fact that you are still doing very well with regards to breathing.  We are going to see you in follow-up after your March 12 CT scan of the chest.

## 2022-02-16 NOTE — Progress Notes (Signed)
Subjective:    Patient ID: [name] [name], male    DOB: 08-Sep-1955, 67 y.o.   MRN: [id number] Patient Care Team: Steele Sizer, MD as PCP - General (Family Medicine) Lloyd Huger, MD as Consulting Physician (Oncology) Abbie Sons, MD (Urology) Eulogio Bear, MD as Consulting Physician (Ophthalmology) Steele Sizer, MD as Attending Physician (Family Medicine) Tyler Pita, MD as Consulting Physician (Pulmonary Disease) Cardiovascular, [facility]  Chief Complaint  Patient presents with   Follow-up    SOB with exertion and dry cough.     HPI [name] [name] is a 67 year old lifelong never smoker, albeit uses chewing tobacco, who follows after evaluation of a recurrent cough.  We last evaluated the patient on 02 December 2021.  Recall that the patient has a history of rectal carcinoma of the anal verge.  He developed a left lung mass and required left upper lobectomy on February 03, 2019.  This was a lone metastatic deposit from adenocarcinoma.  After the procedure his cough had resolved.  However over time he noted recurrence of a dry cough.  No hemoptysis. No specific triggers. Nothing seemed to alleviate it.  He noted shortness of breath when walking on inclines or when carrying a load.  This has been present since his surgery in 2021.  He has not had any chest pain.  No lower extremity edema.  No calf tenderness.  No fevers, chills or sweats.  The patient underwent bronchoscopy on 28 October 2021 and it was noted that he had what appeared to be granulation tissue at this site of his prior lobectomy with a ball-valve type of mechanism occluding his left mainstem bronchus almost completely on exhalation.  He underwent spray cryotherapy for ablation of this granulation tissue.  Since that procedure he has noted that his cough has almost resolved.  He no longer has shortness of breath.  Cryotherapy ablation is usually done in 3 sessions.  Usually 3 to 4 weeks apart.  He had the second  session performed on 23 December 2021.  This is a follow-up from that visit.  That procedure was more involved thus we performed cryotherapy and balloon dilation of the airway.  Airway patency was markedly improved after procedure.  We repeated biopsies during the December procedure and these were negative for recurrent carcinoma.  Since the second procedure the patient continues to do well.  He does not note the degree of dyspnea previously.  This is markedly improved.  He only notices dyspnea if he is carrying a heavy load.  He still has occasional dry cough but this also has improved significantly.  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  He is to have a follow-up CT chest abdomen and pelvis on 04 April 2022.  Overall today he feels well and looks well.  Review of Systems A 10 point review of systems was performed and it is as noted above otherwise negative.  Patient Active Problem List   Diagnosis Date Noted   Bronchial obstruction 12/23/2021   Excessive granulation tissue    S/P lobectomy of lung 04/07/2021   Metastatic cancer to lung ([place]) 08/11/2020   Essential hypertension 08/11/2020   Dyslipidemia 08/11/2020   Spasm of muscle of lower back 08/11/2020   Peyronie's disease 10/15/2019   Tobacco use 10/15/2019   Glaucoma  of right eye 03/22/2018   Erectile dysfunction 03/22/2018   Decreased vision of right eye 09/21/2017   Colostomy in place [facility]) 09/21/2017   Hypogonadism in male 09/21/2017   Perennial allergic rhinitis with seasonal variation    Degeneration of lumbar intervertebral disc 06/26/2017   Rectal cancer ([place]) 08/05/2014   Social History   Tobacco Use   Smoking status: Former    Types: Cigars   Smokeless tobacco: Current    Types: Chew   Tobacco comments:    might smoke a cigar once every 3  months.  No cigar use.  Still using dip.  12/02/21 khj  Substance Use Topics   Alcohol use: Yes    Alcohol/week: 0.0 standard drinks of alcohol    Comment: RARE   Allergies  Allergen Reactions   Sulfa Antibiotics Hives   Morphine And Related Other (See Comments)    Altered mental status   Current Meds  Medication Sig   acetaminophen (TYLENOL) 500 MG tablet Take 1,000 mg by mouth every 6 (six) hours as needed for mild pain.   albuterol (VENTOLIN HFA) 108 (90 Base) MCG/ACT inhaler INHALE 2 PUFFS BY MOUTH INTO THE LUNGS EVERY 6 HOURS AS NEEDED FOR WHEEZING OR SHORTNESS OF BREATH   amLODipine (NORVASC) 10 MG tablet Take 1 tablet (10 mg total) by mouth daily.   calcium carbonate (TUMS - DOSED IN MG ELEMENTAL CALCIUM) 500 MG chewable tablet Chew 2 tablets by mouth as needed for indigestion or heartburn.   cetirizine (ZYRTEC) 10 MG tablet Take 10 mg by mouth at bedtime.    cyclobenzaprine (FLEXERIL) 10 MG tablet TAKE 1 TABLET BY MOUTH THREE TIMES A DAY AS NEEDED FOR MUSCLE SPASM   loperamide (IMODIUM) 2 MG capsule Take 4 mg by mouth as needed for diarrhea or loose stools.    Multiple Vitamin (MULTIVITAMIN WITH MINERALS) TABS tablet Take 1 tablet by mouth every evening.    Naphazoline-Pheniramine (VISINE-A OP) Place 1 drop into both eyes daily as needed (allergies).   SYMBICORT 160-4.5 MCG/ACT inhaler Inhale 2 puffs into the lungs 2 (two) times daily.   tadalafil (CIALIS) 20 MG tablet TAKE 1 TAB BY MOUTH 1 HOUR PRIOR TO INTERCOURSE AS NEEDED   Immunization History  Administered Date(s) Administered   Fluad Quad(high Dose 65+) 10/12/2021   Influenza,inj,Quad PF,6+ Mos 03/06/2017, 09/21/2017, 11/28/2018, 10/15/2019   Influenza-Unspecified 11/17/2020   PFIZER(Purple Top)SARS-COV-2 Vaccination 03/10/2019, 04/09/2019   PNEUMOCOCCAL CONJUGATE-20 10/12/2021   Tdap 02/21/2016   Zoster Recombinat (Shingrix) 07/03/2016       Objective:   Physical Exam BP 138/70 (BP Location: Left Arm, Cuff  Size: Normal)   Pulse 100   Temp 98.3 F (36.8 C) (Temporal)   Ht 6\' 3"  (1.905 m)   Wt 271 lb 9.6 oz (123.2 kg)   SpO2 96%   BMI 33.95 kg/m   SpO2: 96 % O2 Device: None (Room air)  GENERAL: Well-developed, well-nourished gentleman no acute distress, fully ambulatory, no conversational dyspnea.  Intermittently coughing during visit. HEAD: Normocephalic, atraumatic.  EYES: Pupils equal, round, reactive to light.  No scleral icterus.  MOUTH: Oral mucosa moist.  No thrush. NECK: Supple. No thyromegaly. Trachea midline. No JVD.  No adenopathy. PULMONARY: Good air entry bilaterally.  No adventitious sounds. CARDIOVASCULAR: S1 and S2. Regular rate and rhythm.  No rubs, murmurs or gallops heard.  Port-A-Cath on left. ABDOMEN: Benign. MUSCULOSKELETAL: No joint deformity, no clubbing, no edema.  NEUROLOGIC: No overt focal deficit, no gait disturbance, speech is fluent. SKIN: Intact,warm,dry.  PSYCH: Mood and behavior normal.     Assessment & Plan:     ICD-10-CM   1. Bronchial obstruction  J98.09    Ideally he should have a repeat cryotherapy Patient unable to schedule at present We will reassess him after CT scan of 12 March    2. Malignant neoplasm metastatic to left lung [facility])  C78.02    This issue adds complexity to his management Being followed by oncology (Dr. Grayland Ormond)    3. Shortness of breath  R06.02    Markedly improved after cryotherapy interventions     We will see the patient in follow-up after his CT scan of the chest abdomen and pelvis that will be performed on 04 April 2022.  This is his request as he is currently unable to undergo procedures due to his schedule.  He is to contact us prior to that time should any new difficulties arise.  Renold Don, MD Advanced Bronchoscopy P[facility]    *This note was dictated using voice recognition software/Dragon.  Despite best efforts to proofread, errors can occur which can change the meaning.  Any transcriptional errors that result from this process are unintentional and may not be fully corrected at the time of dictation.

## 2022-02-21 ENCOUNTER — Other Ambulatory Visit: Payer: Self-pay

## 2022-02-21 DIAGNOSIS — C20 Malignant neoplasm of rectum: Secondary | ICD-10-CM

## 2022-02-21 DIAGNOSIS — Z933 Colostomy status: Secondary | ICD-10-CM

## 2022-02-21 NOTE — Telephone Encounter (Unsigned)
Copied from [place] [id number]. Topic: General - Other >> Feb 21, 2022 10:07 AM Sabas Sous wrote: Reason for CRM: Pt has information to share with the clinic regarding his ostomy supplies, please advise and call back  Best contact: [phone]

## 2022-02-21 NOTE — Telephone Encounter (Signed)
Spoke to patient. Information obtained to send ostomy supply rx to [facility].

## 2022-03-01 DIAGNOSIS — C20 Malignant neoplasm of rectum: Secondary | ICD-10-CM | POA: Diagnosis not present

## 2022-03-17 ENCOUNTER — Telehealth (INDEPENDENT_AMBULATORY_CARE_PROVIDER_SITE_OTHER): Payer: PPO | Admitting: Family Medicine

## 2022-03-17 ENCOUNTER — Encounter: Payer: Self-pay | Admitting: Family Medicine

## 2022-03-17 DIAGNOSIS — J9809 Other diseases of bronchus, not elsewhere classified: Secondary | ICD-10-CM

## 2022-03-17 DIAGNOSIS — C7802 Secondary malignant neoplasm of left lung: Secondary | ICD-10-CM

## 2022-03-17 DIAGNOSIS — J411 Mucopurulent chronic bronchitis: Secondary | ICD-10-CM

## 2022-03-17 DIAGNOSIS — J209 Acute bronchitis, unspecified: Secondary | ICD-10-CM | POA: Diagnosis not present

## 2022-03-17 MED ORDER — PREDNISONE 20 MG PO TABS: 40.0000 mg | ORAL_TABLET | Freq: Every day | ORAL | 0 refills | Status: AC

## 2022-03-17 MED ORDER — ALBUTEROL SULFATE HFA 108 (90 BASE) MCG/ACT IN AERS: 2.0000 | INHALATION_SPRAY | Freq: Four times a day (QID) | RESPIRATORY_TRACT | 1 refills | Status: DC | PRN

## 2022-03-17 MED ORDER — AZITHROMYCIN 250 MG PO TABS: ORAL_TABLET | ORAL | 0 refills | Status: DC

## 2022-03-17 MED ORDER — SYMBICORT 160-4.5 MCG/ACT IN AERO: 2.0000 | INHALATION_SPRAY | Freq: Two times a day (BID) | RESPIRATORY_TRACT | 2 refills | Status: DC

## 2022-03-17 NOTE — Progress Notes (Signed)
Name: [name] [name]   MRN: [id number]    DOB: 03-28-1955   Date:03/17/2022       Progress Note  Subjective:    Chief Complaint  Chief Complaint  Patient presents with   Cough    Pt believes possible bronchitis, started yesterday    I connected with  Barbara Cower Cost  on 03/17/22 at  3:00 PM EST by a video enabled telemedicine application and verified that I am speaking with the correct person using two identifiers.  I discussed the limitations of evaluation and management by telemedicine and the availability of in person appointments. The patient expressed understanding and agreed to proceed. Staff also discussed with the patient that there may be a patient responsible charge related to this service. Patient Location: home Provider Location: [facility] Additional Individuals present:   Cough   Worsening cough than baseline, cough is productive clear to yellow He reports 5-6 episodes recently with similar sx which gets much worse with bronchitis or needing antibiotics Hx of lobectomy, metastatic CA to lung, and bronchial obstruction currently tx with cryotherapy (per Pulm) Managing acute on chronic sx with albuterol and symbicort and mucinex He reports he was told his symbicort will not be refilled because he doesn't have the correct dx or code or stuff done to make insurance pay for it.  He reports no childhood asthma, multiple bronchitis episodes, hx of only occasionally smoking a cigar He is consulting with pulm for obstruction, no pft's that I can find in chart under procedures - per prior pulm note by Dr. Patsey Berthold  09/2021 OV:  3. Shortness of breath  R06.02      Present since surgery in 2021 Previous PFTs with mild restrictive physiology Suspect decreased volumes due to prior left upper lobectomy          Patient Active Problem List   Diagnosis Date Noted   Bronchial obstruction 12/23/2021   Excessive granulation tissue    S/P lobectomy of lung 04/07/2021    Metastatic cancer to lung ([place]) 08/11/2020   Essential hypertension 08/11/2020   Dyslipidemia 08/11/2020   Spasm of muscle of lower back 08/11/2020   Peyronie's disease 10/15/2019   Tobacco use 10/15/2019   Glaucoma of right eye 03/22/2018   Erectile dysfunction 03/22/2018   Decreased vision of right eye 09/21/2017   Colostomy in place [facility]) 09/21/2017   Hypogonadism in male 09/21/2017   Perennial allergic rhinitis with seasonal variation    Degeneration of lumbar intervertebral disc 06/26/2017   Rectal cancer ([place]) 08/05/2014    Social History   Tobacco Use   Smoking status: Former    Types: Cigars   Smokeless tobacco: Current    Types: Chew   Tobacco comments:    might smoke a cigar once every 3 months.  No cigar use.  Still using dip.  12/02/21 khj  Substance Use Topics   Alcohol use: Yes    Alcohol/week: 0.0 standard drinks of alcohol    Comment: RARE     Current Outpatient Medications:    acetaminophen (TYLENOL) 500 MG tablet, Take 1,000 mg by mouth every 6 (six) hours as needed for mild pain., Disp: , Rfl:    albuterol (VENTOLIN HFA) 108 (90 Base) MCG/ACT inhaler, INHALE 2 PUFFS BY MOUTH INTO THE LUNGS EVERY 6 HOURS AS NEEDED FOR WHEEZING OR SHORTNESS OF BREATH, Disp: 18 each, Rfl: 1   amLODipine (NORVASC) 10 MG tablet, Take 1 tablet (10 mg total) by mouth daily., Disp: 90 tablet, Rfl:  1   calcium carbonate (TUMS - DOSED IN MG ELEMENTAL CALCIUM) 500 MG chewable tablet, Chew 2 tablets by mouth as needed for indigestion or heartburn., Disp: , Rfl:    cetirizine (ZYRTEC) 10 MG tablet, Take 10 mg by mouth at bedtime. , Disp: , Rfl:    cyclobenzaprine (FLEXERIL) 10 MG tablet, TAKE 1 TABLET BY MOUTH THREE TIMES A DAY AS NEEDED FOR MUSCLE SPASM, Disp: 90 tablet, Rfl: 0   loperamide (IMODIUM) 2 MG capsule, Take 4 mg by mouth as needed for diarrhea or loose stools. , Disp: , Rfl:    Multiple Vitamin (MULTIVITAMIN WITH MINERALS) TABS tablet, Take 1 tablet by mouth every evening. ,  Disp: , Rfl:    Naphazoline-Pheniramine (VISINE-A OP), Place 1 drop into both eyes daily as needed (allergies)., Disp: , Rfl:    SYMBICORT 160-4.5 MCG/ACT inhaler, Inhale 2 puffs into the lungs 2 (two) times daily., Disp: 1 each, Rfl: 2   tadalafil (CIALIS) 20 MG tablet, TAKE 1 TAB BY MOUTH 1 HOUR PRIOR TO INTERCOURSE AS NEEDED, Disp: 30 tablet, Rfl: 3 No current facility-administered medications for this visit.  Facility-Administered Medications Ordered in Other Visits:    heparin lock flush 100 unit/mL, 500 Units, Intravenous, Once, Finnegan, Kathlene November, MD   sodium chloride flush (NS) 0.9 % injection 10 mL, 10 mL, Intravenous, Once, Grayland Ormond, Kathlene November, MD  Allergies  Allergen Reactions   Sulfa Antibiotics Hives   Morphine And Related Other (See Comments)    Altered mental status    I personally reviewed active problem list, medication list, allergies, family history, social history, health maintenance, notes from last encounter, lab results, imaging with the patient/caregiver today.   Review of Systems  Constitutional: Negative.   HENT: Negative.    Eyes: Negative.   Respiratory:  Positive for cough.   Cardiovascular: Negative.   Gastrointestinal: Negative.   Endocrine: Negative.   Genitourinary: Negative.   Musculoskeletal: Negative.   Skin: Negative.   Allergic/Immunologic: Negative.   Neurological: Negative.   Hematological: Negative.   Psychiatric/Behavioral: Negative.    All other systems reviewed and are negative.     Objective:   Virtual encounter, vitals limited, only able to obtain the following There were no vitals filed for this visit. There is no height or weight on file to calculate BMI. Nursing Note and Vital Signs reviewed.  Physical Exam Vitals and nursing note reviewed.  Pulmonary:     Effort: No tachypnea, accessory muscle usage, respiratory distress or retractions.     Comments: Frequent wet and wheezy sounding cough during visit, able to speak  in full sentences Neurological:     Mental Status: He is alert.     PE limited by virtual encounter  No results found for this or any previous visit (from the past 72 hour(s)).  Assessment and Plan:     ICD-10-CM   1. Acute bronchitis, unspecified organism  J20.9 predniSONE (DELTASONE) 20 MG tablet    azithromycin (ZITHROMAX) 250 MG tablet    albuterol (VENTOLIN HFA) 108 (90 Base) MCG/ACT inhaler    SYMBICORT 160-4.5 MCG/ACT inhaler   onset 2 d ago, worsening cough frequency, productive, worse SOB, tx with mucinex, inhalers, steroid burst and zpak with complicated lung hx    2. Malignant neoplasm metastatic to left lung ([facility])  C78.02 azithromycin (ZITHROMAX) 250 MG tablet    SYMBICORT 160-4.5 MCG/ACT inhaler    3. Bronchial obstruction  J98.09 azithromycin (ZITHROMAX) 250 MG tablet    SYMBICORT 160-4.5 MCG/ACT inhaler  4. Bronchitis, mucopurulent recurrent ([facility])  J41.1 predniSONE (DELTASONE) 20 MG tablet    azithromycin (ZITHROMAX) 250 MG tablet    albuterol (VENTOLIN HFA) 108 (90 Base) MCG/ACT inhaler    SYMBICORT 160-4.5 MCG/ACT inhaler     Recommend close f/up, in person exam and possibly CXR if not improving He may want to also ask pulm about repeat PFT's to help get further assessment to justify inhalers if he continues to have difficulty getting it due to insurance He reports to me 5-6 recent episodes of bronchitis that rapidly worsens   -Red flags and when to present for emergency care or RTC including fever >101.28F, chest pain, shortness of breath, new/worsening/un-resolving symptoms, reviewed with patient at time of visit. Follow up and care instructions discussed and provided in AVS. - I discussed the assessment and treatment plan with the patient. The patient was provided an opportunity to ask questions and all were answered. The patient agreed with the plan and demonstrated an understanding of the instructions.  I provided 20+ minutes of non-face-to-face time  during this encounter.  Delsa Grana, PA-C 03/17/22 2:52 PM

## 2022-03-17 NOTE — Telephone Encounter (Signed)
Appt set

## 2022-03-19 ENCOUNTER — Other Ambulatory Visit: Payer: Self-pay | Admitting: Family Medicine

## 2022-03-27 ENCOUNTER — Other Ambulatory Visit: Payer: Self-pay | Admitting: *Deleted

## 2022-03-27 ENCOUNTER — Encounter: Payer: Self-pay | Admitting: Oncology

## 2022-03-27 DIAGNOSIS — C20 Malignant neoplasm of rectum: Secondary | ICD-10-CM

## 2022-03-27 NOTE — Progress Notes (Signed)
p 

## 2022-03-29 ENCOUNTER — Encounter: Payer: Self-pay | Admitting: Family Medicine

## 2022-03-31 DIAGNOSIS — Z933 Colostomy status: Secondary | ICD-10-CM | POA: Diagnosis not present

## 2022-04-04 ENCOUNTER — Ambulatory Visit
Admission: RE | Admit: 2022-04-04 | Discharge: 2022-04-04 | Disposition: A | Discharge status: Home / Self Care | Payer: PPO | Source: Ambulatory Visit | Attending: Oncology | Admitting: Oncology

## 2022-04-04 ENCOUNTER — Inpatient Hospital Stay: Payer: PPO | Attending: Oncology

## 2022-04-04 DIAGNOSIS — Z85040 Personal history of malignant carcinoid tumor of rectum: Secondary | ICD-10-CM | POA: Insufficient documentation

## 2022-04-04 DIAGNOSIS — R972 Elevated prostate specific antigen [PSA]: Secondary | ICD-10-CM | POA: Diagnosis not present

## 2022-04-04 DIAGNOSIS — Z79890 Hormone replacement therapy (postmenopausal): Secondary | ICD-10-CM | POA: Insufficient documentation

## 2022-04-04 DIAGNOSIS — R918 Other nonspecific abnormal finding of lung field: Secondary | ICD-10-CM | POA: Diagnosis not present

## 2022-04-04 DIAGNOSIS — C20 Malignant neoplasm of rectum: Secondary | ICD-10-CM

## 2022-04-04 DIAGNOSIS — Z87891 Personal history of nicotine dependence: Secondary | ICD-10-CM | POA: Insufficient documentation

## 2022-04-04 LAB — CBC WITH DIFFERENTIAL/PLATELET
Abs Immature Granulocytes: 0.03 10*3/uL (ref 0.00–0.07)
Basophils Absolute: 0.1 10*3/uL (ref 0.0–0.1)
Basophils Relative: 2 %
Eosinophils Absolute: 0.2 10*3/uL (ref 0.0–0.5)
Eosinophils Relative: 4 %
HCT: 41 % (ref 39.0–52.0)
Hemoglobin: 13.5 g/dL (ref 13.0–17.0)
Immature Granulocytes: 1 %
Lymphocytes Relative: 21 %
Lymphs Abs: 0.8 10*3/uL (ref 0.7–4.0)
MCH: 28.9 pg (ref 26.0–34.0)
MCHC: 32.9 g/dL (ref 30.0–36.0)
MCV: 87.8 fL (ref 80.0–100.0)
Monocytes Absolute: 0.4 10*3/uL (ref 0.1–1.0)
Monocytes Relative: 9 %
Neutro Abs: 2.5 10*3/uL (ref 1.7–7.7)
Neutrophils Relative %: 63 %
Platelets: 379 10*3/uL (ref 150–400)
RBC: 4.67 MIL/uL (ref 4.22–5.81)
RDW: 14.2 % (ref 11.5–15.5)
WBC: 3.9 10*3/uL — ABNORMAL LOW (ref 4.0–10.5)
nRBC: 0 % (ref 0.0–0.2)

## 2022-04-04 LAB — COMPREHENSIVE METABOLIC PANEL
ALT: 16 U/L (ref 0–44)
AST: 19 U/L (ref 15–41)
Albumin: 4.1 g/dL (ref 3.5–5.0)
Alkaline Phosphatase: 62 U/L (ref 38–126)
Anion gap: 8 (ref 5–15)
BUN: 18 mg/dL (ref 8–23)
CO2: 24 mmol/L (ref 22–32)
Calcium: 8.7 mg/dL — ABNORMAL LOW (ref 8.9–10.3)
Chloride: 106 mmol/L (ref 98–111)
Creatinine, Ser: 0.85 mg/dL (ref 0.61–1.24)
GFR, Estimated: >60 mL/min (ref >60)
Glucose, Bld: 101 mg/dL — ABNORMAL HIGH (ref 70–99)
Potassium: 3.9 mmol/L (ref 3.5–5.1)
Sodium: 138 mmol/L (ref 135–145)
Total Bilirubin: 0.6 mg/dL (ref 0.3–1.2)
Total Protein: 6.9 g/dL (ref 6.5–8.1)

## 2022-04-04 LAB — PSA: Prostatic Specific Antigen: 4.35 ng/mL — ABNORMAL HIGH (ref 0.00–4.00)

## 2022-04-04 MED ORDER — IOHEXOL 300 MG/ML  SOLN
100.0000 mL | Freq: Once | INTRAMUSCULAR | Status: AC | PRN
  Administered 2022-04-04: 100 mL via INTRAVENOUS

## 2022-04-04 MED ORDER — HEPARIN SOD (PORK) LOCK FLUSH 100 UNIT/ML IV SOLN
500.0000 [IU] | Freq: Once | INTRAVENOUS | Status: AC
  Administered 2022-04-04: 500 [IU] via INTRAVENOUS

## 2022-04-05 LAB — TESTOSTERONE: Testosterone: 198 ng/dL — ABNORMAL LOW (ref 264–916)

## 2022-04-06 ENCOUNTER — Inpatient Hospital Stay (HOSPITAL_BASED_OUTPATIENT_CLINIC_OR_DEPARTMENT_OTHER): Payer: PPO | Admitting: Oncology

## 2022-04-06 ENCOUNTER — Encounter: Payer: Self-pay | Admitting: Pulmonary Disease

## 2022-04-06 ENCOUNTER — Ambulatory Visit (INDEPENDENT_AMBULATORY_CARE_PROVIDER_SITE_OTHER): Payer: PPO | Admitting: Pulmonary Disease

## 2022-04-06 VITALS — BP 146/94 | HR 85 | Temp 97.5°F | Ht 75.0 in | Wt 262.6 lb

## 2022-04-06 DIAGNOSIS — J9809 Other diseases of bronchus, not elsewhere classified: Secondary | ICD-10-CM

## 2022-04-06 DIAGNOSIS — Z85048 Personal history of other malignant neoplasm of rectum, rectosigmoid junction, and anus: Secondary | ICD-10-CM | POA: Diagnosis not present

## 2022-04-06 DIAGNOSIS — R0602 Shortness of breath: Secondary | ICD-10-CM | POA: Diagnosis not present

## 2022-04-06 DIAGNOSIS — J4489 Other specified chronic obstructive pulmonary disease: Secondary | ICD-10-CM

## 2022-04-06 DIAGNOSIS — R972 Elevated prostate specific antigen [PSA]: Secondary | ICD-10-CM | POA: Diagnosis not present

## 2022-04-06 DIAGNOSIS — C20 Malignant neoplasm of rectum: Secondary | ICD-10-CM

## 2022-04-06 LAB — CEA: CEA: 2.4 ng/mL (ref 0.0–4.7)

## 2022-04-06 MED ORDER — BREZTRI AEROSPHERE 160-9-4.8 MCG/ACT IN AERO: 2.0000 | INHALATION_SPRAY | Freq: Two times a day (BID) | RESPIRATORY_TRACT | 0 refills | Status: DC

## 2022-04-06 NOTE — Progress Notes (Signed)
Subjective:    Patient ID: [name] [name], male    DOB: [birth year], 67 y.o.   MRN: [id number] Patient Care Team: Steele Sizer, MD as PCP - General (Family Medicine) Lloyd Huger, MD as Consulting Physician (Oncology) Abbie Sons, MD (Urology) Eulogio Bear, MD as Consulting Physician (Ophthalmology) Steele Sizer, MD as Attending Physician (Family Medicine) Tyler Pita, MD as Consulting Physician (Pulmonary Disease) Cardiovascular, [facility]  Chief Complaint  Patient presents with   Follow-up    SOB with exertion. No wheezing. Cough with white sputum. Had Bronchitis the last week of Feb.    HPI [name] [name] is a 67 year old lifelong never smoker, albeit uses chewing tobacco, who follows after evaluation of a recurrent cough.  I last saw the patient on 16 February 2022.  Recall that the patient has a history of rectal carcinoma of the anal verge. He developed a left lung mass and required left upper lobectomy on February 03, 2019.  This was a lone metastatic deposit from adenocarcinoma.  After the procedure his cough had resolved.  However over time he noted recurrence of a dry cough. No hemoptysis. No specific triggers. Nothing seemed to alleviate it.  He noted shortness of breath when walking on inclines or when carrying a load.  This has been present since his surgery in 2021.  He has not had any chest pain.  No lower extremity edema.  No calf tenderness.  No fevers, chills or sweats.  The patient underwent bronchoscopy on 28 October 2021 and it was noted that he had what appeared to be granulation tissue at this site of his prior lobectomy with a ball-valve type of mechanism occluding his left mainstem bronchus almost completely on exhalation.  He underwent spray cryotherapy for ablation of this granulation tissue.  Since that procedure he has noted that his cough has resolved.  He no longer has shortness of breath.  Cryotherapy ablation is usually done in 3 sessions.   Usually 3 to 4 weeks apart.  He had the second session performed on 23 December 2021.  That procedure was more involved thus we performed cryotherapy and balloon dilation of the airway.  Airway patency was markedly improved after procedure.  We repeated biopsies during the December procedure and these were negative for recurrent carcinoma.  Since the second procedure the patient continues to do well. He does not note the degree of dyspnea he previously experienced.  He only notices dyspnea if he is carrying an unusually heavy load.  He has not had any cough of late.  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  He had CT chest of abdomen and pelvis on 12 March, I have reviewed the films independently and the area in question to has been treated on the left looks fairly patent.  [name] [name] had an episode of "bronchitis" towards the end of February however he responded to azithromycin that was provided by primary care.  He is on Symbicort for asthmatic bronchitis.  Review of Systems A 10 point review of systems was performed and it is as noted above otherwise negative.  Patient Active Problem List   Diagnosis Date Noted   Bronchial obstruction 12/23/2021   Excessive granulation tissue    S/P lobectomy of lung 04/07/2021   Metastatic cancer to lung ([place]) 08/11/2020  Essential hypertension 08/11/2020   Dyslipidemia 08/11/2020   Spasm of muscle of lower back 08/11/2020   Peyronie's disease 10/15/2019   Tobacco use 10/15/2019   Glaucoma of right eye 03/22/2018   Erectile dysfunction 03/22/2018   Decreased vision of right eye 09/21/2017   Colostomy in place [facility]) 09/21/2017   Hypogonadism in male 09/21/2017   Perennial allergic rhinitis with seasonal variation    Degeneration of lumbar intervertebral disc 06/26/2017   Rectal cancer  ([place]) 08/05/2014   Social History   Tobacco Use   Smoking status: Former    Types: Cigars   Smokeless tobacco: Current    Types: Chew   Tobacco comments:    might smoke a cigar once every 3 months.  No cigar use.  Still using dip.  12/02/21 khj  Substance Use Topics   Alcohol use: Yes    Alcohol/week: 0.0 standard drinks of alcohol    Comment: RARE   Allergies  Allergen Reactions   Sulfa Antibiotics Hives   Morphine And Related Other (See Comments)    Altered mental status   Current Meds  Medication Sig   acetaminophen (TYLENOL) 500 MG tablet Take 1,000 mg by mouth every 6 (six) hours as needed for mild pain.   albuterol (VENTOLIN HFA) 108 (90 Base) MCG/ACT inhaler Inhale 2 puffs into the lungs every 6 (six) hours as needed for wheezing or shortness of breath.   amLODipine (NORVASC) 10 MG tablet Take 1 tablet (10 mg total) by mouth daily.   Budeson-Glycopyrrol-Formoterol (BREZTRI AEROSPHERE) 160-9-4.8 MCG/ACT AERO Inhale 2 puffs into the lungs in the morning and at bedtime.   calcium carbonate (TUMS - DOSED IN MG ELEMENTAL CALCIUM) 500 MG chewable tablet Chew 2 tablets by mouth as needed for indigestion or heartburn.   cetirizine (ZYRTEC) 10 MG tablet Take 10 mg by mouth at bedtime.    cyclobenzaprine (FLEXERIL) 10 MG tablet TAKE 1 TABLET BY MOUTH THREE TIMES A DAY AS NEEDED FOR MUSCLE SPASM   loperamide (IMODIUM) 2 MG capsule Take 4 mg by mouth as needed for diarrhea or loose stools.    Multiple Vitamin (MULTIVITAMIN WITH MINERALS) TABS tablet Take 1 tablet by mouth every evening.    Naphazoline-Pheniramine (VISINE-A OP) Place 1 drop into both eyes daily as needed (allergies).   tadalafil (CIALIS) 20 MG tablet TAKE 1 TAB BY MOUTH 1 HOUR PRIOR TO INTERCOURSE AS NEEDED   [DISCONTINUED] SYMBICORT 160-4.5 MCG/ACT inhaler Inhale 2 puffs into the lungs 2 (two) times daily.   Immunization History  Administered Date(s) Administered   COVID-19, mRNA, vaccine(Comirnaty)12 years and  older 02/21/2022   Fluad Quad(high Dose 65+) 10/12/2021   Influenza,inj,Quad PF,6+ Mos 03/06/2017, 09/21/2017, 11/28/2018, 10/15/2019   Influenza-Unspecified 11/17/2020   PFIZER(Purple Top)SARS-COV-2 Vaccination 03/10/2019, 04/09/2019   PNEUMOCOCCAL CONJUGATE-20 10/12/2021   Respiratory Syncytial Virus Vaccine,Recomb Aduvanted(Arexvy) 02/21/2022   Tdap 02/21/2016   Zoster Recombinat (Shingrix) 07/03/2016       Objective:   Physical Exam BP (!) 146/94 (BP Location: Left Arm, Cuff Size: Large)   Pulse 85   Temp (!) 97.5 F (36.4 C)   Ht '6\' 3"'$  (1.905 m)   Wt 262 lb 9.6 oz (119.1 kg)   SpO2 99%   BMI 32.82 kg/m   SpO2: 99 % O2 Device: None (Room air)  GENERAL: Well-developed, well-nourished gentleman no acute distress, fully ambulatory, no conversational dyspnea.  Intermittently coughing during visit. HEAD: Normocephalic, atraumatic.  EYES: Pupils equal, round, reactive to light.  No scleral icterus.  MOUTH: Oral mucosa  moist.  No thrush. NECK: Supple. No thyromegaly. Trachea midline. No JVD.  No adenopathy. PULMONARY: Good air entry bilaterally.  No adventitious sounds. CARDIOVASCULAR: S1 and S2. Regular rate and rhythm.  No rubs, murmurs or gallops heard.  Port-A-Cath on left. ABDOMEN: Benign. MUSCULOSKELETAL: No joint deformity, no clubbing, no edema.  NEUROLOGIC: No overt focal deficit, no gait disturbance, speech is fluent. SKIN: Intact,warm,dry. PSYCH: Mood and behavior normal.  Representative image from CT performed 04 April 2022 showing increased patency in the area previously noted to have bronchial obstruction:     Assessment & Plan:     ICD-10-CM   1. Bronchial obstruction  J98.09    Status post balloon dilation and cryo ablation Will need follow-up bronchoscopy in the future    2. Shortness of breath  R06.02    Markedly improved after interventional procedure    3. Asthmatic bronchitis , chronic  J44.89    Symbicort no longer covered by insurance Trial  of Breztri 2 puffs twice a day     Meds ordered this encounter  Medications   Budeson-Glycopyrrol-Formoterol (BREZTRI AEROSPHERE) 160-9-4.8 MCG/ACT AERO    Sig: Inhale 2 puffs into the lungs in the morning and at bedtime.    Dispense:  11.8 g    Refill:  0    Order Specific Question:   Lot Number?    Answer:   [id number]    Order Specific Question:   Expiration Date?    Answer:   04/23/2024    Order Specific Question:   Manufacturer?    Answer:   [employer] [[id number]]    Order Specific Question:   Quantity    Answer:   2   You are getting the patient a trial of Breztri 2 puffs twice a day.  He is to let us know how he does with this so we can call the prescription into his pharmacy.  He is to follow-up in June (end of his baseball season).  He is to call sooner should he develops any new difficulties.  Renold Don, MD Advanced Bronchoscopy PCCM [facility]    *This note was dictated using voice recognition software/Dragon.  Despite best efforts to proofread, errors can occur which can change the meaning. Any transcriptional errors that result from this process are unintentional and may not be fully corrected at the time of dictation.

## 2022-04-06 NOTE — Patient Instructions (Signed)
We will plan to see you back in mid to late June.  We are giving you a trial of an inhaler called Breztri this is 2 puffs twice a day.  Let us know how you do with this inhaler.  This is covered by your insurance and is pretty much the "sister" medication to the Symbicort.  Call sooner prior to your appointment should you have any new difficulties.

## 2022-04-06 NOTE — Progress Notes (Signed)
I am almost done he [facility]  [phone] [phone]  ID: [name] Cower Pries OB: 16-Dec-1955  MR#: [id number]  [id number]  Patient Care Team: Steele Sizer, MD as PCP - General (Family Medicine) Lloyd Huger, MD as Consulting Physician (Oncology) Abbie Sons, MD (Urology) Eulogio Bear, MD as Consulting Physician (Ophthalmology) Steele Sizer, MD as Attending Physician (Family Medicine) Tyler Pita, MD as Consulting Physician (Pulmonary Disease) Cardiovascular, [place]  I connected with [name] [name] on 04/06/22 at  3:30 PM EDT by video enabled telemedicine visit and verified that I am speaking with the correct person using two identifiers.   I discussed the limitations, risks, security and privacy concerns of performing an evaluation and management service by telemedicine and the availability of in-person appointments. I also discussed with the patient that there may be a patient responsible charge related to this service. The patient expressed understanding and agreed to proceed.   Other persons participating in the visit and their role in the encounter: Patient, MD.  Patient's location: Home. Provider's location: Clinic.  CHIEF COMPLAINT: Recurrent, stage IV rectal adenocarcinoma.  INTERVAL HISTORY: Patient agreed to video assisted telemedicine visit for further evaluation and discussion of his imaging and laboratory results.  He currently feels well and is asymptomatic.  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  He has no neurologic complaints.  He denies any weakness or fatigue. He does not complain of peripheral neuropathy today.  He denies any recent fevers or illnesses.  He has occasional cough and shortness of breath, but denies any chest pain or hemoptysis.  He denies nausea, vomiting, constipation, or diarrhea. He has noted no blood or melena in his colostomy bag.  He has  no urinary complaints.  Patient offers no further specific complaints today.  REVIEW OF SYSTEMS:   Review of Systems  Constitutional:  Negative for fever, malaise/fatigue and weight loss.  Respiratory:  Positive for cough and shortness of breath. Negative for hemoptysis.   Cardiovascular: Negative.  Negative for chest pain and leg swelling.  Gastrointestinal: Negative.  Negative for abdominal pain, blood in stool, constipation, diarrhea and melena.  Genitourinary: Negative.  Negative for dysuria.  Musculoskeletal: Negative.  Negative for back pain.  Skin: Negative.  Negative for rash.  Neurological: Negative.  Negative for tingling, sensory change, focal weakness, weakness and headaches.  Psychiatric/Behavioral: Negative.  The patient is not nervous/anxious and does not have insomnia.     As per HPI. Otherwise, a complete review of systems is negative.  PAST MEDICAL HISTORY: Past Medical History:  Diagnosis Date   Anemia    Arthritis    Cancer ([place]) 2015   rectal, followed by Oncology, permanent colostomy   Cancer of upper lobe of left lung ([place]) 01/2019   Colostomy in place [facility])    Dyspnea    on exertion   History of kidney stones    Hypertension    Mass of upper lobe of left lung 01/2019   Neuromuscular disorder ([place])    Pneumonia    Rectal cancer ([place]) 2015   Seasonal allergies     PAST SURGICAL HISTORY: Past Surgical History:  Procedure Laterality Date   BACK SURGERY     CHOLECYSTECTOMY     COLON SURGERY     ENDOBRONCHIAL ULTRASOUND N/A 07/01/2018   Procedure: ENDOBRONCHIAL ULTRASOUND;  Surgeon: Tyler Pita, MD;  Location: [facility];  Service: Cardiopulmonary;  Laterality: N/A;   FLEXIBLE  BRONCHOSCOPY Left 11/20/2018   Procedure: FLEXIBLE BRONCHOSCOPY;  Surgeon: Tyler Pita, MD;  Location: [facility];  Service: Cardiopulmonary;  Laterality: Left;   FLEXIBLE BRONCHOSCOPY Left 12/23/2021   Procedure: FLEXIBLE BRONCHOSCOPY;  Surgeon: Tyler Pita, MD;   Location: [facility];  Service: Pulmonary;  Laterality: Left;   INTERCOSTAL NERVE BLOCK Left 02/03/2019   Procedure: INTERCOSTAL NERVE BLOCK WITH EXPAREL;  Surgeon: Grace Isaac, MD;  Location: [place];  Service: Thoracic;  Laterality: Left;   KNEE SURGERY Bilateral    laceration of wrist Right    LOBECTOMY Left 02/03/2019   Procedure: LEFT UPPER LOBECTOMY WITH LYMPH NODE DISSECTION;  Surgeon: Grace Isaac, MD;  Location: [place];  Service: Thoracic;  Laterality: Left;   PORT A CATH REVISION Left 08/05/2018   Procedure: PORT A CATH REVISION, REPOSITION LEFT;  Surgeon: Robert Bellow, MD;  Location: [place] [facility];  Service: General;  Laterality: Left;   PORT-A-CATH REMOVAL Right 12/02/2014   Procedure: REMOVAL PORT-A-CATH;  Surgeon: Marlyce Huge, MD;  Location: [facility];  Service: General;  Laterality: Right;   PORTACATH PLACEMENT     PORTACATH PLACEMENT Left 07/29/2018   Procedure: INSERTION PORT-A-CATH LEFT;  Surgeon: Robert Bellow, MD;  Location: [facility];  Service: General;  Laterality: Left;   RECTAL BIOPSY  04/30/2013   THORACOTOMY/LOBECTOMY Left 02/03/2019   Procedure: MINITHORACOTOMY;  Surgeon: Grace Isaac, MD;  Location: [place];  Service: Thoracic;  Laterality: Left;   VIDEO ASSISTED THORACOSCOPY (VATS)/WEDGE RESECTION Left 02/03/2019   Procedure: VIDEO ASSISTED THORACOSCOPY (VATS)/LUNG RESECTION;  Surgeon: Grace Isaac, MD;  Location: [facility];  Service: Thoracic;  Laterality: Left;   VIDEO BRONCHOSCOPY N/A 02/03/2019   Procedure: VIDEO BRONCHOSCOPY;  Surgeon: Grace Isaac, MD;  Location: [place];  Service: Thoracic;  Laterality: N/A;   VIDEO BRONCHOSCOPY N/A 10/28/2021   Procedure: VIDEO BRONCHOSCOPY WITH CRYO and DEBULKING;  Surgeon: Tyler Pita, MD;  Location: [facility];  Service: Cardiopulmonary;  Laterality: N/A;   VIDEO BRONCHOSCOPY WITH ENDOBRONCHIAL ULTRASOUND N/A 12/30/2018   Procedure: VIDEO BRONCHOSCOPY WITH ENDOBRONCHIAL ULTRASOUND;   Surgeon: Grace Isaac, MD;  Location: [place];  Service: Thoracic;  Laterality: N/A;   XI ROBOT ABDOMINAL PERINEAL RESECTION  10/09/2013    FAMILY HISTORY: Father with prostate cancer.  COPD, CHF.     ADVANCED DIRECTIVES:    HEALTH MAINTENANCE: Social History   Tobacco Use   Smoking status: Former    Types: Cigars   Smokeless tobacco: Current    Types: Chew   Tobacco comments:    might smoke a cigar once every 3 months.  No cigar use.  Still using dip.  12/02/21 khj  Vaping Use   Vaping Use: Never used  Substance Use Topics   Alcohol use: Yes    Alcohol/week: 0.0 standard drinks of alcohol    Comment: RARE   Drug use: No     Colonoscopy:  PAP:  Bone density:  Lipid panel:  Allergies  Allergen Reactions   Sulfa Antibiotics Hives   Morphine And Related Other (See Comments)    Altered mental status    Current Outpatient Medications  Medication Sig Dispense Refill   acetaminophen (TYLENOL) 500 MG tablet Take 1,000 mg by mouth every 6 (six) hours as needed for mild pain.     albuterol (VENTOLIN HFA) 108 (90 Base) MCG/ACT inhaler Inhale 2 puffs into the lungs every 6 (six) hours as needed for wheezing or shortness of breath. 18 each 1  amLODipine (NORVASC) 10 MG tablet Take 1 tablet (10 mg total) by mouth daily. 90 tablet 1   azithromycin (ZITHROMAX) 250 MG tablet Take 2 tabs (500 mg) PO qd x 1, then take 1 tab (250 mg) PO qd for day 2-5 (Patient not taking: Reported on 04/06/2022) 6 each 0   Budeson-Glycopyrrol-Formoterol (BREZTRI AEROSPHERE) 160-9-4.8 MCG/ACT AERO Inhale 2 puffs into the lungs in the morning and at bedtime. 11.8 g 0   calcium carbonate (TUMS - DOSED IN MG ELEMENTAL CALCIUM) 500 MG chewable tablet Chew 2 tablets by mouth as needed for indigestion or heartburn.     cetirizine (ZYRTEC) 10 MG tablet Take 10 mg by mouth at bedtime.      cyclobenzaprine (FLEXERIL) 10 MG tablet TAKE 1 TABLET BY MOUTH THREE TIMES A DAY AS NEEDED FOR MUSCLE SPASM 90 tablet 0    loperamide (IMODIUM) 2 MG capsule Take 4 mg by mouth as needed for diarrhea or loose stools.      Multiple Vitamin (MULTIVITAMIN WITH MINERALS) TABS tablet Take 1 tablet by mouth every evening.      Naphazoline-Pheniramine (VISINE-A OP) Place 1 drop into both eyes daily as needed (allergies).     tadalafil (CIALIS) 20 MG tablet TAKE 1 TAB BY MOUTH 1 HOUR PRIOR TO INTERCOURSE AS NEEDED 30 tablet 3   No current facility-administered medications for this visit.   Facility-Administered Medications Ordered in Other Visits  Medication Dose Route Frequency Provider Last Rate Last Admin   heparin lock flush 100 unit/mL  500 Units Intravenous Once Lloyd Huger, MD       sodium chloride flush (NS) 0.9 % injection 10 mL  10 mL Intravenous Once Lloyd Huger, MD        OBJECTIVE: There were no vitals filed for this visit.   There is no height or weight on file to calculate BMI.    ECOG FS:0 - Asymptomatic  General: Well-developed, well-nourished, no acute distress. HEENT: Normocephalic. Neuro: Alert, answering all questions appropriately. Cranial nerves grossly intact. Psych: Normal affect.  LAB RESULTS:  Lab Results  Component Value Date   NA 138 04/04/2022   K 3.9 04/04/2022   CL 106 04/04/2022   CO2 24 04/04/2022   GLUCOSE 101 (H) 04/04/2022   BUN 18 04/04/2022   CREATININE 0.85 04/04/2022   CALCIUM 8.7 (L) 04/04/2022   PROT 6.9 04/04/2022   ALBUMIN 4.1 04/04/2022   AST 19 04/04/2022   ALT 16 04/04/2022   ALKPHOS 62 04/04/2022   BILITOT 0.6 04/04/2022   GFRNONAA >60 04/04/2022   GFRAA >60 09/16/2019    Lab Results  Component Value Date   WBC 3.9 (L) 04/04/2022   NEUTROABS 2.5 04/04/2022   HGB 13.5 04/04/2022   HCT 41.0 04/04/2022   MCV 87.8 04/04/2022   PLT 379 04/04/2022     STUDIES: CT CHEST ABDOMEN PELVIS W CONTRAST  Result Date: 04/05/2022 CLINICAL DATA:  Rectal cancer staging. * Tracking Code: BO * additional history of lung carcinoma EXAM: CT  CHEST, ABDOMEN, AND PELVIS WITH CONTRAST TECHNIQUE: Multidetector CT imaging of the chest, abdomen and pelvis was performed following the standard protocol during bolus administration of intravenous contrast. RADIATION DOSE REDUCTION: This exam was performed according to the departmental dose-optimization program which includes automated exposure control, adjustment of the mA and/or kV according to patient size and/or use of iterative reconstruction technique. CONTRAST:  174m OMNIPAQUE IOHEXOL 300 MG/ML  SOLN COMPARISON:  CT 03/25/2021 FINDINGS: CT CHEST FINDINGS Cardiovascular: Port in the  anterior chest wall with tip in distal SVC. No significant vascular findings. Normal heart size. No pericardial effusion. Mediastinum/Nodes: No axillary or supraclavicular adenopathy. No mediastinal or hilar adenopathy. No pericardial fluid. Esophagus normal. Lungs/Pleura: Fine branching nodularity in the posterior RIGHT lower lobe is decreased in density compared to prior (image 132/3). No new or suspicious pulmonary nodules. Musculoskeletal: No aggressive osseous lesion. CT ABDOMEN AND PELVIS FINDINGS Hepatobiliary: No focal hepatic lesion. Postcholecystectomy. No biliary dilatation. Pancreas: Pancreas is normal. No ductal dilatation. No pancreatic inflammation. Spleen: Normal spleen Adrenals/urinary tract: Adrenal glands, kidneys and ureters normal. Stomach/Bowel: Stomach, small bowel, appendix, and cecum are normal. LEFT lower quadrant colostomy. Several diverticula of the descending colon. Post rectosigmoid colectomy. Soft tissue thickening in the presacral space is unchanged. No pelvic lymphadenopathy. No mesenteric lymphadenopathy. Vascular/Lymphatic: Abdominal aorta is normal caliber. There is no retroperitoneal or periportal lymphadenopathy. No pelvic lymphadenopathy. Reproductive: Prostate unremarkable Other: No free fluid. Musculoskeletal: No aggressive osseous lesion. IMPRESSION: CHEST IMPRESSION: 1. No evidence of  thoracic metastasis. 2. Fine branching nodularity in the RIGHT lower lobe is decreased in density consistent with inflammatory/infectious process. PELVIS IMPRESSION: 1. No evidence of colorectal carcinoma recurrence or metastasis in the abdomen pelvis. 2. Post rectosigmoid colectomy with LEFT lower quadrant colostomy. 3. Stable soft tissue thickening in the presacral space. Electronically Signed   By: Suzy Bouchard M.D.   On: 04/05/2022 17:13    ONCOLOGY HISTORY:  Patient was initially considered a clinical stage IIIa, but after completing neoadjuvant 5-FU and XRT followed by surgical excision on October 08, 2013 he was noted to be pathologic stage Ia.  He declined adjuvant FOLFOX at that time.  More recently, patient's CEA started trending up and CT of the chest revealed a large 7.7 cm left upper lobe mass biopsy consistent with rectal adenocarcinoma.  Patient completed cycle 6 of FOLFOX plus Avastin on October 16, 2018.  Repeat biopsy did not reveal any evidence of malignancy, but persistent fungal infection.  Patient underwent repeat bronchoscopy on December 30, 2018 which did in fact reveal residual malignancy.  He subsequently underwent surgical resection on February 03, 2019 with complete removal of residual disease.  One lymph node had a "fragment" of malignancy.  Patient did not require additional adjuvant chemotherapy or XRT.   ASSESSMENT: Recurrent, stage IV rectal adenocarcinoma.  PLAN:    Recurrent, stage IV rectal adenocarcinoma: See oncology history as above.  No evidence of disease.  Patient's most recent CT scan on April 04, 2022 reviewed independently and report as above with no obvious evidence of recurrent or progressive disease.  CEA remains within normal limits.  No intervention is needed at this time.  Continue follow-up with pulmonology as indicated.  Return to clinic in 6 months with repeat imaging, laboratory work, and video-assisted telemedicine visit. Cough/shortness of  breath: Significantly improved after ablation of granulation tissue by pulmonary.  Continue follow-up with pulmonary as indicated. Elevated PSA: Patient is going to monitor his external testosterone use.  I provided 20 minutes of face-to-face video visit time during this encounter which included chart review, counseling, and coordination of care as documented above.    Patient expressed understanding and was in agreement with this plan. He also understands that He can call clinic at any time with any questions, concerns, or complaints.    Lloyd Huger, MD   04/06/2022 4:14 PM

## 2022-04-11 ENCOUNTER — Encounter: Payer: Self-pay | Admitting: Pulmonary Disease

## 2022-04-12 MED ORDER — BREZTRI AEROSPHERE 160-9-4.8 MCG/ACT IN AERO: 2.0000 | INHALATION_SPRAY | Freq: Two times a day (BID) | RESPIRATORY_TRACT | 5 refills | Status: DC

## 2022-04-17 ENCOUNTER — Other Ambulatory Visit: Payer: Self-pay | Admitting: Internal Medicine

## 2022-04-18 NOTE — Telephone Encounter (Signed)
Requested medication (s) are due for refill today:   Provider to review  Requested medication (s) are on the active medication list:   Yes  Future visit scheduled:   Yes   Last ordered: 03/20/2022 #90, 0 refills  Non delegated refill    Requested Prescriptions  Pending Prescriptions Disp Refills   cyclobenzaprine (FLEXERIL) 10 MG tablet [Pharmacy Med Name: CYCLOBENZAPRINE 10 MG TABLET] 90 tablet 0    Sig: TAKE 1 TABLET BY MOUTH THREE TIMES A DAY AS NEEDED FOR MUSCLE SPASM     Not Delegated - Analgesics:  Muscle Relaxants Failed - 04/17/2022  2:47 PM      Failed - This refill cannot be delegated      Passed - Valid encounter within last 6 months    Recent Outpatient Visits           1 month ago Acute bronchitis, unspecified organism   [facility] Delsa Grana, PA-C   6 months ago Chemotherapy-induced neuropathy [facility])   [facility] Steele Sizer, MD   1 year ago Essential hypertension   [facility] Steele Sizer, MD   1 year ago [facility] Kathrine Haddock, NP   1 year ago Essential hypertension   [facility] Steele Sizer, MD       Future Appointments             In 2 months Ancil Boozer, Drue Stager, MD [facility], [facility]

## 2022-05-04 ENCOUNTER — Other Ambulatory Visit: Payer: Self-pay | Admitting: Family Medicine

## 2022-05-04 DIAGNOSIS — J411 Mucopurulent chronic bronchitis: Secondary | ICD-10-CM

## 2022-05-04 DIAGNOSIS — J209 Acute bronchitis, unspecified: Secondary | ICD-10-CM

## 2022-05-20 ENCOUNTER — Other Ambulatory Visit: Payer: Self-pay | Admitting: Family Medicine

## 2022-05-27 DIAGNOSIS — Z933 Colostomy status: Secondary | ICD-10-CM | POA: Diagnosis not present

## 2022-06-05 ENCOUNTER — Telehealth: Payer: Self-pay | Admitting: Family Medicine

## 2022-06-05 NOTE — Telephone Encounter (Signed)
Contacted [name] [name] to schedule their annual wellness visit. Appointment made for 06/09/2022.  [facility] Care Guide [facility] AWV TEAM Direct Dial: [phone]

## 2022-06-09 ENCOUNTER — Telehealth: Payer: Self-pay

## 2022-06-09 ENCOUNTER — Ambulatory Visit: Payer: PPO

## 2022-06-09 NOTE — Telephone Encounter (Signed)
06/09/2022 10:27 AM EDT by Sue Lush, LPN  Outgoing Batley, Jeydan Dorminy (Self) [phone] (Mobile) Remove  Left Message - lft msg calling to do AWV.Marland Kitchenrtn call  06/09/2022 10:28 AM EDT by Sue Lush, LPN  Outgoing Artola, Stinson Raser (Self) [phone] (Home) Remove  No Answer/Busy - vm is full..was unable to lv message

## 2022-06-13 ENCOUNTER — Encounter: Payer: Self-pay | Admitting: Family Medicine

## 2022-06-18 ENCOUNTER — Other Ambulatory Visit: Payer: Self-pay | Admitting: Family Medicine

## 2022-06-24 DIAGNOSIS — J9809 Other diseases of bronchus, not elsewhere classified: Secondary | ICD-10-CM

## 2022-06-24 HISTORY — DX: Other diseases of bronchus, not elsewhere classified: J98.09

## 2022-06-26 ENCOUNTER — Encounter: Payer: Self-pay | Admitting: Family Medicine

## 2022-06-27 ENCOUNTER — Other Ambulatory Visit: Payer: Self-pay | Admitting: Oncology

## 2022-06-27 DIAGNOSIS — J411 Mucopurulent chronic bronchitis: Secondary | ICD-10-CM

## 2022-06-27 DIAGNOSIS — J209 Acute bronchitis, unspecified: Secondary | ICD-10-CM

## 2022-06-29 DIAGNOSIS — Z933 Colostomy status: Secondary | ICD-10-CM | POA: Diagnosis not present

## 2022-07-09 ENCOUNTER — Other Ambulatory Visit: Payer: Self-pay | Admitting: Family Medicine

## 2022-07-09 DIAGNOSIS — I10 Essential (primary) hypertension: Secondary | ICD-10-CM

## 2022-07-13 ENCOUNTER — Ambulatory Visit: Payer: Medicare HMO | Admitting: Family Medicine

## 2022-07-16 ENCOUNTER — Other Ambulatory Visit: Payer: Self-pay | Admitting: Internal Medicine

## 2022-07-18 ENCOUNTER — Other Ambulatory Visit: Payer: Self-pay

## 2022-07-18 MED ORDER — CYCLOBENZAPRINE HCL 10 MG PO TABS: ORAL_TABLET | ORAL | 0 refills | Status: DC

## 2022-07-18 NOTE — Telephone Encounter (Signed)
Requested medication (s) are due for refill today: routing for review  Requested medication (s) are on the active medication list: yes  Last refill:  06/20/22  Future visit scheduled: yes  Notes to clinic:  Unable to refill per protocol, cannot delegate.      Requested Prescriptions  Pending Prescriptions Disp Refills   cyclobenzaprine (FLEXERIL) 10 MG tablet [Pharmacy Med Name: CYCLOBENZAPRINE 10 MG TABLET] 90 tablet 0    Sig: TAKE 1 TABLET BY MOUTH THREE TIMES A DAY AS NEEDED FOR MUSCLE SPASM     Not Delegated - Analgesics:  Muscle Relaxants Failed - 07/16/2022  1:33 PM      Failed - This refill cannot be delegated      Passed - Valid encounter within last 6 months    Recent Outpatient Visits           4 months ago Acute bronchitis, unspecified organism   [facility] Danelle Berry, PA-C   9 months ago Chemotherapy-induced neuropathy [facility])   [facility] Alba Cory, MD   1 year ago Essential hypertension   [facility] Alba Cory, MD   1 year ago Bronchitis   [facility] Gabriel Cirri, NP   1 year ago Essential hypertension   [facility] Alba Cory, MD       Future Appointments             In 3 weeks Alba Cory, MD [facility]   In 1 month  [facility], [facility]

## 2022-07-19 ENCOUNTER — Encounter: Payer: Self-pay | Admitting: Family Medicine

## 2022-07-19 ENCOUNTER — Ambulatory Visit: Payer: PPO | Admitting: Pulmonary Disease

## 2022-07-19 ENCOUNTER — Encounter: Payer: Self-pay | Admitting: Pulmonary Disease

## 2022-07-19 ENCOUNTER — Telehealth: Payer: Self-pay

## 2022-07-19 VITALS — BP 130/80 | HR 85 | Temp 97.8°F | Ht 75.0 in | Wt 273.6 lb

## 2022-07-19 DIAGNOSIS — J9809 Other diseases of bronchus, not elsewhere classified: Secondary | ICD-10-CM | POA: Diagnosis not present

## 2022-07-19 DIAGNOSIS — R0602 Shortness of breath: Secondary | ICD-10-CM

## 2022-07-19 DIAGNOSIS — J4489 Other specified chronic obstructive pulmonary disease: Secondary | ICD-10-CM

## 2022-07-19 NOTE — Telephone Encounter (Signed)
Bronchoscopy with Cryoablation and Bronchial dilation 08/09/2022 at 12:30pm Bronchial Obstruction 31622, 31641, 31630  Synetta Fail please see Bronch info.

## 2022-07-19 NOTE — Telephone Encounter (Signed)
Pre Admit- 08/01/2022 8-1pm   I have notified the patient.

## 2022-07-19 NOTE — Progress Notes (Signed)
Subjective:    Patient ID: [name] [name], male    DOB: Aug 08, 1955, 67 y.o.   MRN: [id number]  Patient Care Team: Alba Cory, MD as PCP - General (Family Medicine) Jeralyn Ruths, MD as Consulting Physician (Oncology) Riki Altes, MD (Urology) [place] Crane, MD as Consulting Physician (Ophthalmology) Alba Cory, MD as Attending Physician (Family Medicine) Salena Saner, MD as Consulting Physician (Pulmonary Disease) Cardiovascular, [facility]  Chief Complaint  Patient presents with   Follow-up    DOE. No wheezing. Cough with clear sputum.     HPI [name] [name] is a 67 year old lifelong never smoker, albeit uses chewing tobacco, who follows after evaluation of a recurrent cough.  I last saw the patient on 06 April 2022.  Recall that the patient has a history of rectal carcinoma of the anal verge. He developed a left lung mass and required left upper lobectomy on February 03, 2019.  This was a lone metastatic deposit from adenocarcinoma.  After the procedure his cough had resolved.  However, over time he noted recurrence of a dry cough. No hemoptysis. No specific triggers. Nothing seemed to alleviate it.  He noted shortness of breath when walking on inclines or when carrying a load.  This has been present since his surgery in 2021. The patient underwent bronchoscopy on 28 October 2021 and it was noted that he had what appeared to be granulation tissue at this site of his prior lobectomy with a ball-valve type of mechanism occluding his left mainstem bronchus almost completely on exhalation.  He underwent spray cryotherapy for ablation of this granulation tissue.  After the initial procedure he noted marked improvement in his cough and shortness of breath. Cryotherapy ablation is usually done in 3 sessions.  Usually 3 to 4 weeks apart.  He had the second session performed on 23 December 2021.  That procedure was more involved thus we performed cryotherapy and balloon dilation of  the airway.  Airway patency was markedly improved after procedure.  We repeated biopsies during the December procedure and these were negative for recurrent carcinoma.  Previously he noted that since the second procedure he continued to do well. He does not note the degree of dyspnea he previously experienced.  He only notices dyspnea if he is carrying an unusually heavy load. 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  He had CT chest of abdomen and pelvis on 12 March, I have reviewed the films independently and the area in question to has been treated on the left looked fairly patent at that time.  Since his prior visit however he has started to note now increased shortness of breath and "wheezing" particularly when he exerts himself.  Cough has returned somewhat.  I suspect he needs repeat intervention.   At his prior visit on 14 March we also switched his inhalers to [facility] 2 puffs twice a day, he is doing well with this inhaler.  Notes that when he uses it his lungs feel like he can take a deeper breath.  He has not had any chest pain.  No lower extremity edema.  No calf tenderness.  No fevers, chills or sweats.  No sputum production or hemoptysis.  Appetite and weight have been stable.  He does not endorse any other complaints.     Review of Systems A 10 point review of systems was  performed and it is as noted above otherwise negative.   Patient Active Problem List   Diagnosis Date Noted   Bronchial obstruction 12/23/2021   Excessive granulation tissue    S/P lobectomy of lung 04/07/2021   Metastatic cancer to lung (HCC) 08/11/2020   Essential hypertension 08/11/2020   Dyslipidemia 08/11/2020   Spasm of muscle of lower back 08/11/2020   Peyronie's disease 10/15/2019   Tobacco use 10/15/2019   Glaucoma of right eye 03/22/2018   Erectile dysfunction 03/22/2018    Decreased vision of right eye 09/21/2017   Colostomy in place [facility]) 09/21/2017   Hypogonadism in male 09/21/2017   Perennial allergic rhinitis with seasonal variation    Degeneration of lumbar intervertebral disc 06/26/2017   Rectal cancer (HCC) 08/05/2014    Social History   Tobacco Use   Smoking status: Former    Types: Cigars   Smokeless tobacco: Current    Types: Chew   Tobacco comments:    No Cigars. Does Dip. Khj 07/19/2022  Substance Use Topics   Alcohol use: Yes    Alcohol/week: 0.0 standard drinks of alcohol    Comment: RARE    Allergies  Allergen Reactions   Sulfa Antibiotics Hives   Morphine And Codeine Other (See Comments)    Altered mental status    Current Meds  Medication Sig   acetaminophen (TYLENOL) 500 MG tablet Take 1,000 mg by mouth every 6 (six) hours as needed for mild pain.   albuterol (VENTOLIN HFA) 108 (90 Base) MCG/ACT inhaler INHALE 2 PUFFS BY MOUTH INTO THE LUNGS EVERY 6 HOURS AS NEEDED FOR WHEEZING OR SHORTNESS OF BREATH   amLODipine (NORVASC) 10 MG tablet TAKE 1 TABLET BY MOUTH EVERY DAY   Budeson-Glycopyrrol-Formoterol (BREZTRI AEROSPHERE) 160-9-4.8 MCG/ACT AERO Inhale 2 puffs into the lungs in the morning and at bedtime.   calcium carbonate (TUMS - DOSED IN MG ELEMENTAL CALCIUM) 500 MG chewable tablet Chew 2 tablets by mouth as needed for indigestion or heartburn.   cetirizine (ZYRTEC) 10 MG tablet Take 10 mg by mouth at bedtime.    cyclobenzaprine (FLEXERIL) 10 MG tablet TAKE 1 TABLET BY MOUTH THREE TIMES A DAY AS NEEDED FOR MUSCLE SPASM   loperamide (IMODIUM) 2 MG capsule Take 4 mg by mouth as needed for diarrhea or loose stools.    Multiple Vitamin (MULTIVITAMIN WITH MINERALS) TABS tablet Take 1 tablet by mouth every evening.    tadalafil (CIALIS) 20 MG tablet TAKE 1 TAB BY MOUTH 1 HOUR PRIOR TO INTERCOURSE AS NEEDED    Immunization History  Administered Date(s) Administered   COVID-19, mRNA, vaccine(Comirnaty)12 years and older  02/21/2022   Fluad Quad(high Dose 65+) 10/12/2021   Influenza,inj,Quad PF,6+ Mos 03/06/2017, 09/21/2017, 11/28/2018, 10/15/2019   Influenza-Unspecified 11/17/2020   PFIZER(Purple Top)SARS-COV-2 Vaccination 03/10/2019, 04/09/2019   PNEUMOCOCCAL CONJUGATE-20 10/12/2021   Respiratory Syncytial Virus Vaccine,Recomb Aduvanted(Arexvy) 02/21/2022   Tdap 02/21/2016   Zoster Recombinat (Shingrix) 07/03/2016      Objective:     BP 130/80 (BP Location: Left Arm, Cuff Size: Large)   Pulse 85   Temp 97.8 F (36.6 C)   Ht 6\' 3"  (1.905 m)   Wt 273 lb 9.6 oz (124.1 kg)   SpO2 100%   BMI 34.20 kg/m   SpO2: 100 % O2 Device: None (Room air)  GENERAL: Well-developed, well-nourished gentleman no acute distress, fully ambulatory, mild conversational dyspnea. HEAD: Normocephalic, atraumatic.  EYES: Pupils equal, round, reactive to light.  No scleral icterus.  MOUTH: Oral mucosa moist.  No thrush. NECK: Supple. No thyromegaly. Trachea midline. No JVD.  No adenopathy. PULMONARY: Good air entry bilaterally.  Fixed wheeze on the left upper lung zone. CARDIOVASCULAR: S1 and S2. Regular rate and rhythm.  No rubs, murmurs or gallops heard.  Port-A-Cath on left. ABDOMEN: Benign. MUSCULOSKELETAL: No joint deformity, no clubbing, no edema.  NEUROLOGIC: No overt focal deficit, no gait disturbance, speech is fluent. SKIN: Intact,warm,dry. PSYCH: Mood and behavior normal.       Assessment & Plan:     ICD-10-CM   1. Bronchial obstruction - LEFT  J98.09    Bronchial stenosis from granulation tissue Recurrent after lobectomy Will need balloon dilation/cryoablation Procedure scheduled for 09 August 2022    2. Shortness of breath  R06.02    Worsened lately likely due to recurrent bronchial stenosis Balloon dilation and cryoablation as above    3. Asthmatic bronchitis , chronic  J44.89    Continue Breztri 2 puffs twice a day Continue as needed albuterol Patient notes benefit from the medication      Plan will be for bronchoscopy with balloon dilation and cryoablation of recurrent bronchial stenosis on the LEFT as needed.  We will see the patient in follow-up in 3 to 4 weeks time after the procedure (4 to 6 weeks time from today's visit) however will stay in contact with the patient before, during and after the procedure.  He is to contact us prior to his follow-up appointment should any new questions or concerns arise.    Gailen Shelter, MD Advanced Bronchoscopy [facility]    *This note was dictated using voice recognition software/Dragon.  Despite best efforts to proofread, errors can occur which can change the meaning. Any transcriptional errors that result from this process are unintentional and may not be fully corrected at the time of dictation.

## 2022-07-19 NOTE — Patient Instructions (Addendum)
We are scheduling your procedure for 17 July.  This will be to dilate that area at that has narrowed.  We will also use cryotherapy at that time.  Will see him in follow-up in 3 to 4 weeks time after the procedure (4 to 6 weeks from today).  But we will stay in contact with you prior to, during and after the procedure.

## 2022-07-19 NOTE — H&P (View-Only) (Signed)
 Subjective:    Patient ID: [name] [name], male    DOB: Aug 08, 1955, 67 y.o.   MRN: [id number]  Patient Care Team: Alba Cory, MD as PCP - General (Family Medicine) Jeralyn Ruths, MD as Consulting Physician (Oncology) Riki Altes, MD (Urology) [place] Crane, MD as Consulting Physician (Ophthalmology) Alba Cory, MD as Attending Physician (Family Medicine) Salena Saner, MD as Consulting Physician (Pulmonary Disease) Cardiovascular, [facility]  Chief Complaint  Patient presents with   Follow-up    DOE. No wheezing. Cough with clear sputum.     HPI Cree is a 67 year old lifelong never smoker, albeit uses chewing tobacco, who follows after evaluation of a recurrent cough.  I last saw the patient on 06 April 2022.  Recall that the patient has a history of rectal carcinoma of the anal verge. He developed a left lung mass and required left upper lobectomy on February 03, 2019.  This was a lone metastatic deposit from adenocarcinoma.  After the procedure his cough had resolved.  However, over time he noted recurrence of a dry cough. No hemoptysis. No specific triggers. Nothing seemed to alleviate it.  He noted shortness of breath when walking on inclines or when carrying a load.  This has been present since his surgery in 2021. The patient underwent bronchoscopy on 28 October 2021 and it was noted that he had what appeared to be granulation tissue at this site of his prior lobectomy with a ball-valve type of mechanism occluding his left mainstem bronchus almost completely on exhalation.  He underwent spray cryotherapy for ablation of this granulation tissue.  After the initial procedure he noted marked improvement in his cough and shortness of breath. Cryotherapy ablation is usually done in 3 sessions.  Usually 3 to 4 weeks apart.  He had the second session performed on 23 December 2021.  That procedure was more involved thus we performed cryotherapy and balloon dilation of  the airway.  Airway patency was markedly improved after procedure.  We repeated biopsies during the December procedure and these were negative for recurrent carcinoma.  Previously he noted that since the second procedure he continued to do well. He does not note the degree of dyspnea he previously experienced.  He only notices dyspnea if he is carrying an unusually heavy load. 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  He had CT chest of abdomen and pelvis on 12 March, I have reviewed the films independently and the area in question to has been treated on the left looked fairly patent at that time.  Since his prior visit however he has started to note now increased shortness of breath and "wheezing" particularly when he exerts himself.  Cough has returned somewhat.  I suspect he needs repeat intervention.   At his prior visit on 14 March we also switched his inhalers to [facility] 2 puffs twice a day, he is doing well with this inhaler.  Notes that when he uses it his lungs feel like he can take a deeper breath.  He has not had any chest pain.  No lower extremity edema.  No calf tenderness.  No fevers, chills or sweats.  No sputum production or hemoptysis.  Appetite and weight have been stable.  He does not endorse any other complaints.     Review of Systems A 10 point review of systems was  performed and it is as noted above otherwise negative.   Patient Active Problem List   Diagnosis Date Noted   Bronchial obstruction 12/23/2021   Excessive granulation tissue    S/P lobectomy of lung 04/07/2021   Metastatic cancer to lung (HCC) 08/11/2020   Essential hypertension 08/11/2020   Dyslipidemia 08/11/2020   Spasm of muscle of lower back 08/11/2020   Peyronie's disease 10/15/2019   Tobacco use 10/15/2019   Glaucoma of right eye 03/22/2018   Erectile dysfunction 03/22/2018    Decreased vision of right eye 09/21/2017   Colostomy in place [facility]) 09/21/2017   Hypogonadism in male 09/21/2017   Perennial allergic rhinitis with seasonal variation    Degeneration of lumbar intervertebral disc 06/26/2017   Rectal cancer (HCC) 08/05/2014    Social History   Tobacco Use   Smoking status: Former    Types: Cigars   Smokeless tobacco: Current    Types: Chew   Tobacco comments:    No Cigars. Does Dip. Khj 07/19/2022  Substance Use Topics   Alcohol use: Yes    Alcohol/week: 0.0 standard drinks of alcohol    Comment: RARE    Allergies  Allergen Reactions   Sulfa Antibiotics Hives   Morphine And Codeine Other (See Comments)    Altered mental status    Current Meds  Medication Sig   acetaminophen (TYLENOL) 500 MG tablet Take 1,000 mg by mouth every 6 (six) hours as needed for mild pain.   albuterol (VENTOLIN HFA) 108 (90 Base) MCG/ACT inhaler INHALE 2 PUFFS BY MOUTH INTO THE LUNGS EVERY 6 HOURS AS NEEDED FOR WHEEZING OR SHORTNESS OF BREATH   amLODipine (NORVASC) 10 MG tablet TAKE 1 TABLET BY MOUTH EVERY DAY   Budeson-Glycopyrrol-Formoterol (BREZTRI AEROSPHERE) 160-9-4.8 MCG/ACT AERO Inhale 2 puffs into the lungs in the morning and at bedtime.   calcium carbonate (TUMS - DOSED IN MG ELEMENTAL CALCIUM) 500 MG chewable tablet Chew 2 tablets by mouth as needed for indigestion or heartburn.   cetirizine (ZYRTEC) 10 MG tablet Take 10 mg by mouth at bedtime.    cyclobenzaprine (FLEXERIL) 10 MG tablet TAKE 1 TABLET BY MOUTH THREE TIMES A DAY AS NEEDED FOR MUSCLE SPASM   loperamide (IMODIUM) 2 MG capsule Take 4 mg by mouth as needed for diarrhea or loose stools.    Multiple Vitamin (MULTIVITAMIN WITH MINERALS) TABS tablet Take 1 tablet by mouth every evening.    tadalafil (CIALIS) 20 MG tablet TAKE 1 TAB BY MOUTH 1 HOUR PRIOR TO INTERCOURSE AS NEEDED    Immunization History  Administered Date(s) Administered   COVID-19, mRNA, vaccine(Comirnaty)12 years and older  02/21/2022   Fluad Quad(high Dose 65+) 10/12/2021   Influenza,inj,Quad PF,6+ Mos 03/06/2017, 09/21/2017, 11/28/2018, 10/15/2019   Influenza-Unspecified 11/17/2020   PFIZER(Purple Top)SARS-COV-2 Vaccination 03/10/2019, 04/09/2019   PNEUMOCOCCAL CONJUGATE-20 10/12/2021   Respiratory Syncytial Virus Vaccine,Recomb Aduvanted(Arexvy) 02/21/2022   Tdap 02/21/2016   Zoster Recombinat (Shingrix) 07/03/2016      Objective:     BP 130/80 (BP Location: Left Arm, Cuff Size: Large)   Pulse 85   Temp 97.8 F (36.6 C)   Ht 6\' 3"  (1.905 m)   Wt 273 lb 9.6 oz (124.1 kg)   SpO2 100%   BMI 34.20 kg/m   SpO2: 100 % O2 Device: None (Room air)  GENERAL: Well-developed, well-nourished gentleman no acute distress, fully ambulatory, mild conversational dyspnea. HEAD: Normocephalic, atraumatic.  EYES: Pupils equal, round, reactive to light.  No scleral icterus.  MOUTH: Oral mucosa moist.  No thrush. NECK: Supple. No thyromegaly. Trachea midline. No JVD.  No adenopathy. PULMONARY: Good air entry bilaterally.  Fixed wheeze on the left upper lung zone. CARDIOVASCULAR: S1 and S2. Regular rate and rhythm.  No rubs, murmurs or gallops heard.  Port-A-Cath on left. ABDOMEN: Benign. MUSCULOSKELETAL: No joint deformity, no clubbing, no edema.  NEUROLOGIC: No overt focal deficit, no gait disturbance, speech is fluent. SKIN: Intact,warm,dry. PSYCH: Mood and behavior normal.       Assessment & Plan:     ICD-10-CM   1. Bronchial obstruction - LEFT  J98.09    Bronchial stenosis from granulation tissue Recurrent after lobectomy Will need balloon dilation/cryoablation Procedure scheduled for 09 August 2022    2. Shortness of breath  R06.02    Worsened lately likely due to recurrent bronchial stenosis Balloon dilation and cryoablation as above    3. Asthmatic bronchitis , chronic  J44.89    Continue Breztri 2 puffs twice a day Continue as needed albuterol Patient notes benefit from the medication      Plan will be for bronchoscopy with balloon dilation and cryoablation of recurrent bronchial stenosis on the LEFT as needed.  We will see the patient in follow-up in 3 to 4 weeks time after the procedure (4 to 6 weeks time from today's visit) however will stay in contact with the patient before, during and after the procedure.  He is to contact us prior to his follow-up appointment should any new questions or concerns arise.    Gailen Shelter, MD Advanced Bronchoscopy PCCM [facility]    *This note was dictated using voice recognition software/Dragon.  Despite best efforts to proofread, errors can occur which can change the meaning. Any transcriptional errors that result from this process are unintentional and may not be fully corrected at the time of dictation.

## 2022-07-20 NOTE — Telephone Encounter (Signed)
For the codes 16109, [id number], [id number] Auth # [id number] valid 07/19/22 to 10/17/2022

## 2022-07-20 NOTE — Telephone Encounter (Signed)
Nothing further needed 

## 2022-07-24 DIAGNOSIS — J9809 Other diseases of bronchus, not elsewhere classified: Secondary | ICD-10-CM

## 2022-07-24 HISTORY — DX: Other diseases of bronchus, not elsewhere classified: J98.09

## 2022-08-01 ENCOUNTER — Inpatient Hospital Stay: Admission: RE | Admit: 2022-08-01 | Payer: PPO | Source: Ambulatory Visit

## 2022-08-01 HISTORY — DX: Hyperlipidemia, unspecified: E78.5

## 2022-08-02 ENCOUNTER — Encounter
Admission: RE | Admit: 2022-08-02 | Discharge: 2022-08-02 | Disposition: A | Discharge status: Home / Self Care | Payer: PPO | Source: Ambulatory Visit | Attending: Pulmonary Disease | Admitting: Pulmonary Disease

## 2022-08-02 VITALS — Ht 75.0 in | Wt 267.0 lb

## 2022-08-02 DIAGNOSIS — Z01812 Encounter for preprocedural laboratory examination: Secondary | ICD-10-CM

## 2022-08-02 DIAGNOSIS — I10 Essential (primary) hypertension: Secondary | ICD-10-CM

## 2022-08-02 NOTE — Patient Instructions (Addendum)
Your procedure is scheduled on: Wednesday, July 17 Report to the Registration Desk on the 1st floor of the [facility] To find out your arrival time, please call [phone] between 1PM - 3PM on: Tuesday, July 16 If your arrival time is 6:00 am, do not arrive before that time as the [facility] entrance doors do not open until 6:00 am.  REMEMBER: Instructions that are not followed completely may result in serious medical risk, up to and including death; or upon the discretion of your surgeon and anesthesiologist your surgery may need to be rescheduled.  Do not eat or drink after midnight the night before surgery.  No gum chewing or hard candies.  One week prior to surgery: starting July 10 Stop Anti-inflammatories (NSAIDS) such as Advil, Aleve, Ibuprofen, Motrin, Naproxen, Naprosyn and Aspirin based products such as Excedrin, Goody's Powder, BC Powder. Stop ANY OVER THE COUNTER supplements until after surgery. Stop multiple vitamin and tums You may however, continue to take Tylenol if needed for pain up until the day of surgery.  Continue taking all prescribed medications with the exception of the following:  Tadalafil (Cialis) - do not take at least 2 days before surgery.  TAKE ONLY THESE MEDICATIONS THE MORNING OF SURGERY WITH A SIP OF WATER:  Amlodipine Breztri inhaler  Use inhaler on the day of surgery and bring your albuterol inhaler to the hospital.  No Alcohol for 24 hours before or after surgery.  No Smoking including e-cigarettes for 24 hours before surgery.  No chewable tobacco products for at least 6 hours before surgery.  No nicotine patches on the day of surgery.  Do not use any "recreational" drugs for at least a week (preferably 2 weeks) before your surgery.  Please be advised that the combination of cocaine and anesthesia may have negative outcomes, up to and including death. If you test positive for cocaine, your surgery will be cancelled.  On the morning  of surgery brush your teeth with toothpaste and water, you may rinse your mouth with mouthwash if you wish. Do not swallow any toothpaste or mouthwash.  Do not wear jewelry, make-up, hairpins, clips or nail polish.  Do not wear lotions, powders, or perfumes.   Do not shave body hair from the neck down 48 hours before surgery.  Contact lenses, hearing aids and dentures may not be worn into surgery.  Do not bring valuables to the hospital. [facility] is not responsible for any missing/lost belongings or valuables.   Notify your doctor if there is any change in your medical condition (cold, fever, infection).  Wear comfortable clothing (specific to your surgery type) to the hospital.  If you are being discharged the day of surgery, you will not be allowed to drive home. You will need a responsible individual to drive you home and stay with you for 24 hours after surgery.   If you are taking public transportation, you will need to have a responsible individual with you.  Please call the Pre-admissions Testing Dept. at [phone] if you have any questions about these instructions.  Surgery Visitation Policy:  Patients having surgery or a procedure may have two visitors.  Children under the age of 59 must have an adult with them who is not the patient.

## 2022-08-04 ENCOUNTER — Encounter
Admission: RE | Admit: 2022-08-04 | Discharge: 2022-08-04 | Disposition: A | Discharge status: Home / Self Care | Payer: PPO | Source: Ambulatory Visit | Attending: Pulmonary Disease | Admitting: Pulmonary Disease

## 2022-08-04 DIAGNOSIS — Z01812 Encounter for preprocedural laboratory examination: Secondary | ICD-10-CM

## 2022-08-04 DIAGNOSIS — Z01818 Encounter for other preprocedural examination: Secondary | ICD-10-CM | POA: Diagnosis not present

## 2022-08-04 DIAGNOSIS — Z01810 Encounter for preprocedural cardiovascular examination: Secondary | ICD-10-CM | POA: Diagnosis not present

## 2022-08-04 DIAGNOSIS — I10 Essential (primary) hypertension: Secondary | ICD-10-CM | POA: Insufficient documentation

## 2022-08-04 LAB — BASIC METABOLIC PANEL
Anion gap: 8 (ref 5–15)
BUN: 19 mg/dL (ref 8–23)
CO2: 22 mmol/L (ref 22–32)
Calcium: 8.7 mg/dL — ABNORMAL LOW (ref 8.9–10.3)
Chloride: 109 mmol/L (ref 98–111)
Creatinine, Ser: 0.81 mg/dL (ref 0.61–1.24)
GFR, Estimated: >60 mL/min (ref >60)
Glucose, Bld: 95 mg/dL (ref 70–99)
Potassium: 3.4 mmol/L — ABNORMAL LOW (ref 3.5–5.1)
Sodium: 139 mmol/L (ref 135–145)

## 2022-08-04 LAB — CBC
HCT: 39.9 % (ref 39.0–52.0)
Hemoglobin: 13.8 g/dL (ref 13.0–17.0)
MCH: 29.5 pg (ref 26.0–34.0)
MCHC: 34.6 g/dL (ref 30.0–36.0)
MCV: 85.3 fL (ref 80.0–100.0)
Platelets: 359 10*3/uL (ref 150–400)
RBC: 4.68 MIL/uL (ref 4.22–5.81)
RDW: 13.9 % (ref 11.5–15.5)
WBC: 3.2 10*3/uL — ABNORMAL LOW (ref 4.0–10.5)
nRBC: 0 % (ref 0.0–0.2)

## 2022-08-09 ENCOUNTER — Ambulatory Visit: Payer: PPO | Admitting: Anesthesiology

## 2022-08-09 ENCOUNTER — Ambulatory Visit: Payer: PPO

## 2022-08-09 ENCOUNTER — Encounter: Payer: Self-pay | Admitting: Pulmonary Disease

## 2022-08-09 ENCOUNTER — Ambulatory Visit: Payer: PPO | Admitting: Urgent Care

## 2022-08-09 ENCOUNTER — Encounter
Admission: RE | Disposition: A | Discharge status: Home / Self Care | Payer: Self-pay | Source: Home / Self Care | Attending: Pulmonary Disease

## 2022-08-09 ENCOUNTER — Other Ambulatory Visit: Payer: Self-pay

## 2022-08-09 ENCOUNTER — Encounter: Payer: PPO | Admitting: Family Medicine

## 2022-08-09 ENCOUNTER — Ambulatory Visit
Admission: RE | Admit: 2022-08-09 | Discharge: 2022-08-09 | Disposition: A | Discharge status: Home / Self Care | Payer: PPO | Attending: Pulmonary Disease | Admitting: Pulmonary Disease

## 2022-08-09 DIAGNOSIS — Z933 Colostomy status: Secondary | ICD-10-CM | POA: Insufficient documentation

## 2022-08-09 DIAGNOSIS — L929 Granulomatous disorder of the skin and subcutaneous tissue, unspecified: Secondary | ICD-10-CM | POA: Diagnosis not present

## 2022-08-09 DIAGNOSIS — Z85118 Personal history of other malignant neoplasm of bronchus and lung: Secondary | ICD-10-CM | POA: Insufficient documentation

## 2022-08-09 DIAGNOSIS — J9809 Other diseases of bronchus, not elsewhere classified: Secondary | ICD-10-CM | POA: Diagnosis not present

## 2022-08-09 DIAGNOSIS — I10 Essential (primary) hypertension: Secondary | ICD-10-CM | POA: Diagnosis not present

## 2022-08-09 DIAGNOSIS — Z87891 Personal history of nicotine dependence: Secondary | ICD-10-CM | POA: Insufficient documentation

## 2022-08-09 DIAGNOSIS — J302 Other seasonal allergic rhinitis: Secondary | ICD-10-CM | POA: Diagnosis not present

## 2022-08-09 DIAGNOSIS — Z902 Acquired absence of lung [part of]: Secondary | ICD-10-CM | POA: Diagnosis not present

## 2022-08-09 DIAGNOSIS — R0989 Other specified symptoms and signs involving the circulatory and respiratory systems: Secondary | ICD-10-CM | POA: Diagnosis not present

## 2022-08-09 DIAGNOSIS — J209 Acute bronchitis, unspecified: Secondary | ICD-10-CM

## 2022-08-09 DIAGNOSIS — J4489 Other specified chronic obstructive pulmonary disease: Secondary | ICD-10-CM | POA: Diagnosis not present

## 2022-08-09 DIAGNOSIS — I700 Atherosclerosis of aorta: Secondary | ICD-10-CM | POA: Diagnosis not present

## 2022-08-09 DIAGNOSIS — Z85048 Personal history of other malignant neoplasm of rectum, rectosigmoid junction, and anus: Secondary | ICD-10-CM | POA: Diagnosis not present

## 2022-08-09 DIAGNOSIS — Z79899 Other long term (current) drug therapy: Secondary | ICD-10-CM | POA: Insufficient documentation

## 2022-08-09 DIAGNOSIS — Z48813 Encounter for surgical aftercare following surgery on the respiratory system: Secondary | ICD-10-CM | POA: Diagnosis not present

## 2022-08-09 DIAGNOSIS — M1990 Unspecified osteoarthritis, unspecified site: Secondary | ICD-10-CM | POA: Insufficient documentation

## 2022-08-09 DIAGNOSIS — C20 Malignant neoplasm of rectum: Secondary | ICD-10-CM

## 2022-08-09 HISTORY — PX: RIGID BRONCHOSCOPY: SHX5069

## 2022-08-09 HISTORY — PX: FLEXIBLE BRONCHOSCOPY: SHX5094

## 2022-08-09 SURGERY — BRONCHOSCOPY, FLEXIBLE
Anesthesia: General | Laterality: Left

## 2022-08-09 MED ORDER — OXYCODONE HCL 5 MG PO TABS: 5.0000 mg | ORAL_TABLET | Freq: Once | ORAL | Status: DC | PRN

## 2022-08-09 MED ORDER — DEXAMETHASONE SODIUM PHOSPHATE 10 MG/ML IJ SOLN
INTRAMUSCULAR | Status: DC | PRN
  Administered 2022-08-09: 10 mg via INTRAVENOUS

## 2022-08-09 MED ORDER — IPRATROPIUM-ALBUTEROL 0.5-2.5 (3) MG/3ML IN SOLN
3.0000 mL | Freq: Once | RESPIRATORY_TRACT | Status: AC
  Administered 2022-08-09: 3 mL via RESPIRATORY_TRACT

## 2022-08-09 MED ORDER — OXYCODONE HCL 5 MG/5ML PO SOLN: 5.0000 mg | Freq: Once | ORAL | Status: DC | PRN

## 2022-08-09 MED ORDER — PROPOFOL 10 MG/ML IV BOLUS
INTRAVENOUS | Status: AC
  Filled 2022-08-09: qty 20

## 2022-08-09 MED ORDER — LIDOCAINE HCL (PF) 2 % IJ SOLN
INTRAMUSCULAR | Status: AC
  Filled 2022-08-09: qty 5

## 2022-08-09 MED ORDER — PROPOFOL 10 MG/ML IV BOLUS
INTRAVENOUS | Status: DC | PRN
  Administered 2022-08-09: 150 mg via INTRAVENOUS

## 2022-08-09 MED ORDER — IPRATROPIUM-ALBUTEROL 0.5-2.5 (3) MG/3ML IN SOLN
RESPIRATORY_TRACT | Status: AC
  Filled 2022-08-09: qty 3

## 2022-08-09 MED ORDER — SUGAMMADEX SODIUM 200 MG/2ML IV SOLN
INTRAVENOUS | Status: DC | PRN
  Administered 2022-08-09: 400 mg via INTRAVENOUS

## 2022-08-09 MED ORDER — ONDANSETRON HCL 4 MG/2ML IJ SOLN: 4.0000 mg | Freq: Once | INTRAMUSCULAR | Status: DC | PRN

## 2022-08-09 MED ORDER — SODIUM CHLORIDE 0.9 % IV SOLN: Freq: Once | INTRAVENOUS | Status: DC

## 2022-08-09 MED ORDER — FENTANYL CITRATE (PF) 100 MCG/2ML IJ SOLN: 25.0000 ug | INTRAMUSCULAR | Status: DC | PRN

## 2022-08-09 MED ORDER — PREDNISONE 20 MG PO TABS: 20.0000 mg | ORAL_TABLET | Freq: Every day | ORAL | 0 refills | Status: AC

## 2022-08-09 MED ORDER — LIDOCAINE HCL (CARDIAC) PF 100 MG/5ML IV SOSY
PREFILLED_SYRINGE | INTRAVENOUS | Status: DC | PRN
  Administered 2022-08-09: 100 mg via INTRAVENOUS

## 2022-08-09 MED ORDER — ACETAMINOPHEN 10 MG/ML IV SOLN: 1000.0000 mg | Freq: Once | INTRAVENOUS | Status: DC | PRN

## 2022-08-09 MED ORDER — HEPARIN SOD (PORK) LOCK FLUSH 100 UNIT/ML IV SOLN
500.0000 [IU] | Freq: Once | INTRAVENOUS | Status: AC
  Administered 2022-08-09: 500 [IU] via INTRAVENOUS

## 2022-08-09 MED ORDER — FAMOTIDINE 20 MG PO TABS
20.0000 mg | ORAL_TABLET | Freq: Once | ORAL | Status: AC
  Administered 2022-08-09: 20 mg via ORAL

## 2022-08-09 MED ORDER — ORAL CARE MOUTH RINSE: 15.0000 mL | Freq: Once | OROMUCOSAL | Status: AC

## 2022-08-09 MED ORDER — ROCURONIUM BROMIDE 100 MG/10ML IV SOLN
INTRAVENOUS | Status: DC | PRN
  Administered 2022-08-09: 50 mg via INTRAVENOUS

## 2022-08-09 MED ORDER — CHLORHEXIDINE GLUCONATE 0.12 % MT SOLN
OROMUCOSAL | Status: AC
  Filled 2022-08-09: qty 15

## 2022-08-09 MED ORDER — AZITHROMYCIN 250 MG PO TABS: ORAL_TABLET | ORAL | 0 refills | Status: DC

## 2022-08-09 MED ORDER — FAMOTIDINE 20 MG PO TABS
ORAL_TABLET | ORAL | Status: AC
  Filled 2022-08-09: qty 1

## 2022-08-09 MED ORDER — FENTANYL CITRATE (PF) 100 MCG/2ML IJ SOLN
INTRAMUSCULAR | Status: AC
  Filled 2022-08-09: qty 2

## 2022-08-09 MED ORDER — ONDANSETRON HCL 4 MG/2ML IJ SOLN
INTRAMUSCULAR | Status: DC | PRN
  Administered 2022-08-09: 4 mg via INTRAVENOUS

## 2022-08-09 MED ORDER — FENTANYL CITRATE (PF) 100 MCG/2ML IJ SOLN
INTRAMUSCULAR | Status: DC | PRN
  Administered 2022-08-09 (×2): 50 ug via INTRAVENOUS

## 2022-08-09 MED ORDER — LACTATED RINGERS IV SOLN: INTRAVENOUS | Status: DC

## 2022-08-09 MED ORDER — HEPARIN SOD (PORK) LOCK FLUSH 100 UNIT/ML IV SOLN
INTRAVENOUS | Status: AC
  Filled 2022-08-09: qty 5

## 2022-08-09 MED ORDER — CHLORHEXIDINE GLUCONATE 0.12 % MT SOLN
15.0000 mL | Freq: Once | OROMUCOSAL | Status: AC
  Administered 2022-08-09: 15 mL via OROMUCOSAL

## 2022-08-09 NOTE — Anesthesia Procedure Notes (Signed)
Procedure Name: Intubation Date/Time: 08/09/2022 1:01 PM  Performed by: Ginger Carne, CRNAPre-anesthesia Checklist: Patient identified, Emergency Drugs available, Suction available, Patient being monitored and Timeout performed Patient Re-evaluated:Patient Re-evaluated prior to induction Oxygen Delivery Method: Circle system utilized Preoxygenation: Pre-oxygenation with 100% oxygen Induction Type: IV induction Ventilation: Mask ventilation with difficulty, Two handed mask ventilation required and Oral airway inserted - appropriate to patient size Laryngoscope Size: McGraph and 3 Grade View: Grade II Tube type: Oral Tube size: 9.0 mm Number of attempts: 2 Airway Equipment and Method: Stylet and Video-laryngoscopy Placement Confirmation: ETT inserted through vocal cords under direct vision, positive ETCO2 and breath sounds checked- equal and bilateral Secured at: 22 cm Tube secured with: Tape Dental Injury: Teeth and Oropharynx as per pre-operative assessment

## 2022-08-09 NOTE — Interval H&P Note (Signed)
[name] [name] has presented today for surgery, with the diagnosis of Bronchial Obstruction/stenosis.  The various methods of treatment have been discussed with the patient and family. After consideration of risks, benefits and other options for treatment, the patient has consented to  Procedure(s): Bronchoscopy with Cryoablation and Balloon Dilation-LEFT as a surgical intervention.  The patient's history has been reviewed, patient examined, no change in status, stable for surgery.  I have reviewed the patient's chart and labs.  Questions were answered to the patient's satisfaction.  Benefits, limitations and potential complications of the procedure were discussed with the patient/family.  Complications from bronchoscopy are rare and most often minor, but if they occur they may include breathing difficulty, vocal cord spasm, hoarseness, slight fever, vomiting, dizziness, bronchospasm, infection, low blood oxygen, bleeding from biopsy site, or an allergic reaction to medications.  It is uncommon for patients to experience other more serious complications for example: Collapsed lung requiring chest tube placement, respiratory failure, heart attack and/or cardiac arrhythmia.  Patient agrees to proceed.  Gailen Shelter, MD Advanced Bronchoscopy PCCM [facility]    *This note was dictated using voice recognition software/Dragon.  Despite best efforts to proofread, errors can occur which can change the meaning. Any transcriptional errors that result from this process are unintentional and may not be fully corrected at the time of dictation.

## 2022-08-09 NOTE — Transfer of Care (Signed)
Immediate Anesthesia Transfer of Care Note  Patient: [name] [name]  Procedure(s) Performed: FLEXIBLE BRONCHOSCOPY (Left) RIGID BRONCHOSCOPY (Left)  Patient Location: PACU  Anesthesia Type:General  Level of Consciousness: awake, alert , and drowsy  Airway & Oxygen Therapy: Patient Spontanous Breathing and Patient connected to face mask oxygen  Post-op Assessment: Report given to RN and Post -op Vital signs reviewed and stable  Post vital signs: Reviewed and stable  Last Vitals:  Vitals Value Taken Time  BP 136/67 08/09/22 1349  Temp    Pulse 74 08/09/22 1349  Resp 14 08/09/22 1349  SpO2 99 % 08/09/22 1349    Last Pain:  Vitals:   08/09/22 1204  TempSrc: Tympanic  PainSc: 0-No pain         Complications: No notable events documented.

## 2022-08-09 NOTE — Anesthesia Postprocedure Evaluation (Signed)
Anesthesia Post Note  Patient: [name] [name]  Procedure(s) Performed: FLEXIBLE BRONCHOSCOPY (Left) RIGID BRONCHOSCOPY (Left)  Patient location during evaluation: PACU Anesthesia Type: General Level of consciousness: awake and alert Pain management: pain level controlled Vital Signs Assessment: post-procedure vital signs reviewed and stable Respiratory status: spontaneous breathing, nonlabored ventilation, respiratory function stable and patient connected to nasal cannula oxygen Cardiovascular status: blood pressure returned to baseline and stable Postop Assessment: no apparent nausea or vomiting Anesthetic complications: no   No notable events documented.   Last Vitals:  Vitals:   08/09/22 1415 08/09/22 1430  BP: 128/74 124/73  Pulse: 69 63  Resp: 18 13  Temp:  36.7 C  SpO2: 100% 96%    Last Pain:  Vitals:   08/09/22 1430  TempSrc:   PainSc: 0-No pain                 Corinda Gubler

## 2022-08-09 NOTE — Anesthesia Preprocedure Evaluation (Addendum)
Anesthesia Evaluation  Patient identified by MRN, date of birth, ID band Patient awake    Reviewed: Allergy & Precautions, NPO status , Patient's Chart, lab work & pertinent test results  History of Anesthesia Complications Negative for: history of anesthetic complications  Airway Mallampati: II  TM Distance: >3 FB Neck ROM: Full    Dental no notable dental hx. (+) Teeth Intact   Pulmonary shortness of breath, neg sleep apnea, neg COPD, Patient abstained from smoking.Not current smoker, former smoker   Pulmonary exam normal breath sounds clear to auscultation       Cardiovascular Exercise Tolerance: Good METShypertension, Pt. on medications (-) CAD and (-) Past MI (-) dysrhythmias  Rhythm:Regular Rate:Normal - Systolic murmurs    Neuro/Psych  Neuromuscular disease  negative psych ROS   GI/Hepatic ,neg GERD  ,,(+)     (-) substance abuse    Endo/Other  neg diabetes    Renal/GU negative Renal ROS     Musculoskeletal  (+) Arthritis ,    Abdominal   Peds  Hematology   Anesthesia Other Findings Past Medical History: No date: Anemia No date: Arthritis 07/2022: Bronchial obstruction 06/2022: Bronchial stenosis, left 2015: Cancer (HCC)     Comment:  rectal, followed by Oncology, permanent colostomy 01/2019: Cancer of upper lobe of left lung (HCC) No date: Colostomy in place [facility]) No date: Dyslipidemia No date: Dyspnea     Comment:  on exertion No date: History of kidney stones No date: Hypertension 01/2019: Mass of upper lobe of left lung No date: Neuromuscular disorder (HCC) No date: Pneumonia 2015: Rectal cancer (HCC) No date: Seasonal allergies  Reproductive/Obstetrics                             Anesthesia Physical Anesthesia Plan  ASA: 3  Anesthesia Plan: General   Post-op Pain Management:    Induction: Intravenous  PONV Risk Score and Plan: 2 and Ondansetron and  Dexamethasone  Airway Management Planned: Oral ETT  Additional Equipment: None  Intra-op Plan:   Post-operative Plan: Extubation in OR  Informed Consent: I have reviewed the patients History and Physical, chart, labs and discussed the procedure including the risks, benefits and alternatives for the proposed anesthesia with the patient or authorized representative who has indicated his/her understanding and acceptance.     Dental advisory given  Plan Discussed with: CRNA and Surgeon  Anesthesia Plan Comments: (Discussed risks of anesthesia with patient, including PONV, sore throat, lip/dental/eye damage. Rare risks discussed as well, such as cardiorespiratory and neurological sequelae, and allergic reactions. Discussed the role of CRNA in patient's perioperative care. Patient understands.)       Anesthesia Quick Evaluation

## 2022-08-09 NOTE — Discharge Instructions (Signed)

## 2022-08-09 NOTE — Op Note (Signed)
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      Unable to obtain consent; procedure performed on emergent basis.     Other:    Benefits, limitations and potential complications of the procedure were discussed with the patient/family.  Complications from bronchoscopy are rare and most often minor, but if they occur they may include breathing difficulty, vocal cord spasm, hoarseness, slight fever, vomiting, dizziness, bronchospasm, infection, low blood oxygen, bleeding from biopsy site, or an allergic reaction to medications.  It is uncommon for patients to experience other more serious complications for example: Collapsed lung requiring chest tube placement, respiratory failure, heart attack and/or cardiac arrhythmia.  Patient agrees to proceed.   Anesthesia type: General endotracheal   Surgeon: Gailen Shelter, MD Assistant/Scrub: Everlean Alstrom, RRT Circulator: Glendale Chard, RRT Anesthesiologist/CRNA: Merton Border Zak,MD/Stephanie Michelet, CRNA Cytotechnology: Not utilized    PROCEDURE DETAILS: Patient was taken to Procedure Room 2 (Bronchoscopy Suite) in the OR area.  Appropriate Timeout performed and correct patient, name, ID and laterality confirmed.  Patient was inducted under general anesthesia and intubated with an 9.0 ET  tube without difficulty.  Once the patient was under adequate general anesthesia a Portex adapter was placed in the ET tube flange.  Through the Portex adapter the Olympus video therapeutic bronchoscope was then advanced.  The visible distal trachea was normal, carina appeared sharp. The right upper lobe , right middle lobe, lower lobe bronchi were free of endobronchial lesions but there was significant erythema consistent with bronchitic changes.  There were were copious nonpurulent secretions that were suctioned and lavaged until clear.  At this point bronchoscope was brought to the left mainstem and examination revealed recurrent "ball-valve mechanism" occlusion of the left mainstem bronchus by tissue protruding from a left upper lobectomy suture line.  The area was becoming stenotic again.  As in the right, the mucosa also appeared to have significant erythema consistent with bronchitic changes.  The area of obstruction was bypassed and examination with the bronchoscope of the lower lobe subsegments showed him to be patent but with copious clear secretions noted.  These were lavaged until cleared. Once this was performed I proceeded to perform 2 initial cycles of cryoablation with a truFreeze spray cryotherapy catheter.  A total of 2 cycles of 5-second freezes were performed on the tissue.  The patient was appropriately passively vented by deflating the ET tube cuff and disconnecting from the circuit, breath-holding per anesthesia.  After the appropriate freezing time was completed the airway was then examined thoroughly to ensure adequate hemostasis.  At this point utilizing a [employer] CRE pulmonary balloon the patient had the area of stricture dilated stepwise fashion beginning at 8 mm to ending at 10 mm each dilation for approximately 30 seconds.  Following balloon dilation another 2 cycles of 5 seconds for a cryotherapy with truFreeze were  performed.  The patient was allowed to passively vent as  described previously by deflating ET tube balloon, disengaging ET tube flange and breath-holding during the procedure.  The patient was allowed to equilibrate between freezing cycles.  After the second cycle was completed, we seated to dilate the stenotic area to a 10 mm diameter on the CRE balloon at 30 seconds x 2.  The balloon at this time was retrieved and the airway was examined for adequate hemostasis.  Noting excellent hemostasis, the patient received 9 mL of 1% lidocaine via bronchial lavage prior to retrieving the bronchoscope.  At this point the procedure was terminated and the patient was allowed to emerge from general anesthesia. Patient was extubated in the procedure room and transported to the PACU in satisfactory condition.  The patient did not have any complaints of chest pain or shortness of breath postprocedure.  Postprocedure examination shows symmetrical lung sounds. Postprocedure chest x-ray showed no pneumothorax.  Patient tolerated the procedure well.   TECHNICAL PROCEDURES: (Place X beside choice below)   Procedures  Description    None     Electrocautery   X  Cryotherapy   X  Balloon Dilatation     Bronchography     Stent Placement     Therapeutic Aspiration     Laser/Argon Plasma    Brachytherapy Catheter Placement    Foreign Body Removal     SPECIMENS (Sites): (Place X beside choice below)  Specimens Description  X No Specimens Obtained     Washings    Lavage    Biopsies    Fine Needle Aspirates    Brushings    Sputum    FINDINGS:  Stenotic area on left mainstem bronchus next to suture line from prior lobectomy:   On exhalation almost complete occlusion of the left mainstem bronchus:   During balloon dilation:   During cryotherapy:   After initial balloon dilation:   After final balloon dilation:   ESTIMATED BLOOD LOSS: Nil   COMPLICATIONS/RESOLUTION: none    IMPRESSION:POST-PROCEDURE DX:  Stricture of the left mainstem bronchus due to  granulation tissue in the left upper lobe suture line Status post cryoablation of tissue Status post endobronchial balloon dilation Acute bronchitic changes with copious secretions  RECOMMENDATION/PLAN:  Routine post bronchoscopy instructions Azithromycin treatment pack Prednisone 20 mg daily x 5 days Patient has appropriate pulmonary/oncology appointments   C. Danice Goltz, MD Advanced Bronchoscopy PCCM  [facility]    *This note was dictated using voice recognition software/Dragon.  Despite best efforts to proofread, errors can occur which can change the meaning. Any transcriptional errors that result from this process are unintentional and may not be fully corrected at the time of dictation.

## 2022-08-09 NOTE — Telephone Encounter (Signed)
Dr. Jayme Cloud, please advise. I do not see what Symbicort was mentioned previously.

## 2022-08-10 ENCOUNTER — Encounter: Payer: Self-pay | Admitting: Pulmonary Disease

## 2022-08-10 NOTE — Telephone Encounter (Signed)
I discussed this with him after the bronchoscopy yesterday.  I would have him continue the [facility] for now.  We can see if our pharmacy team can find out if generic Symbicort is covered by his insurance.  I think the glycopyrrolate in the [name] is likely making his secretions thicker.  He was doing very well on Symbicort previously but we had to switch it to [place] due to insurance noncoverage.

## 2022-08-10 NOTE — Telephone Encounter (Signed)
Pharmacy team, please advise if generic symbicort is covered. Thank you.

## 2022-08-11 ENCOUNTER — Other Ambulatory Visit (HOSPITAL_COMMUNITY): Payer: Self-pay

## 2022-08-11 NOTE — Telephone Encounter (Signed)
Lets see if Advair or Virgel Bouquet are covered. For Breo it would be 200/25 dose 1 puff daily. If  Advair, 250/50 1 puff twice a day, rinse well after use.

## 2022-08-11 NOTE — Telephone Encounter (Signed)
Lets see if Advair or Virgel Bouquet are covered. For Breo it would be 200/25 dose 1 puff daily. If  Advair, 250/50 1 puff twice a day, rinse well after use.        Pharmacy team, please do test claim. Thanks

## 2022-08-11 NOTE — Telephone Encounter (Signed)
Neither Brand or Generic Symbicort is covered at this time.

## 2022-08-11 NOTE — Telephone Encounter (Signed)
Dr. Jayme Cloud, please see pharmacy teams response.   Neither Brand or Generic Symbicort is covered at this time.

## 2022-08-13 ENCOUNTER — Other Ambulatory Visit: Payer: Self-pay | Admitting: Family Medicine

## 2022-08-15 ENCOUNTER — Telehealth: Payer: Self-pay

## 2022-08-15 ENCOUNTER — Encounter: Payer: Self-pay | Admitting: Pulmonary Disease

## 2022-08-15 MED ORDER — METHYLPREDNISOLONE 4 MG PO TBPK: ORAL_TABLET | ORAL | 0 refills | Status: DC

## 2022-08-15 NOTE — Telephone Encounter (Signed)
Lets see if Advair or Virgel Bouquet are covered. For Breo it would be 200/25 dose 1 puff daily. If  Advair, 250/50 1 puff twice a day, rinse well after use.        Pharmacy team, please do test claim. Thanks

## 2022-08-15 NOTE — Telephone Encounter (Signed)
Lets send a Medrol Dosepak.  The pharmacy team is checking on alternative inhalers.  He had quite a bit of inflammation in his airway when we did his bronchoscopy.

## 2022-08-17 ENCOUNTER — Ambulatory Visit (INDEPENDENT_AMBULATORY_CARE_PROVIDER_SITE_OTHER): Payer: PPO

## 2022-08-17 VITALS — Ht 75.0 in | Wt 268.0 lb

## 2022-08-17 DIAGNOSIS — Z0000 Encounter for general adult medical examination without abnormal findings: Secondary | ICD-10-CM

## 2022-08-17 NOTE — Progress Notes (Signed)
Subjective:   [name] [name] is a 67 y.o. male who presents for Medicare Annual/Subsequent preventive examination.  Visit Complete: Virtual  I connected with  [name] on 08/17/22 by a audio enabled telemedicine application and verified that I am speaking with the correct person using two identifiers.  Patient Location: Home  Provider Location: Home Office  I discussed the limitations of evaluation and management by telemedicine. The patient expressed understanding and agreed to proceed.  Patient Medicare AWV questionnaire was completed by the patient on ; I have confirmed that all information answered by patient is correct and no changes since this date.  Review of Systems     Cardiac Risk Factors include: advanced age (>36men, >27 women);male gender;smoking/ tobacco exposure;hypertension;dyslipidemia     Objective:    Today's Vitals   08/17/22 1030 08/17/22 1031  Weight: 268 lb (121.6 kg)   Height: 6\' 3"  (1.905 m)   PainSc:  0-No pain   Body mass index is 33.5 kg/m.     08/17/2022   10:38 AM 08/02/2022    9:11 AM 04/06/2022    2:21 PM 12/23/2021   11:10 AM 12/13/2021   10:59 AM 10/28/2021   10:54 AM 10/21/2021   10:48 AM  Advanced Directives  Does Patient Have a Medical Advance Directive? No No No No No No No  Would patient like information on creating a medical advance directive? No - Patient declined  No - Patient declined No - Patient declined  No - Patient declined     Current Medications (verified) Outpatient Encounter Medications as of 08/17/2022  Medication Sig   acetaminophen (TYLENOL) 500 MG tablet Take 1,000 mg by mouth every 6 (six) hours as needed for mild pain.   albuterol (VENTOLIN HFA) 108 (90 Base) MCG/ACT inhaler INHALE 2 PUFFS BY MOUTH INTO THE LUNGS EVERY 6 HOURS AS NEEDED FOR WHEEZING OR SHORTNESS OF BREATH   amLODipine (NORVASC) 10 MG tablet TAKE 1 TABLET BY MOUTH EVERY DAY   Budeson-Glycopyrrol-Formoterol (BREZTRI AEROSPHERE) 160-9-4.8  MCG/ACT AERO Inhale 2 puffs into the lungs in the morning and at bedtime.   calcium carbonate (TUMS - DOSED IN MG ELEMENTAL CALCIUM) 500 MG chewable tablet Chew 2 tablets by mouth as needed for indigestion or heartburn.   cetirizine (ZYRTEC) 10 MG tablet Take 10 mg by mouth at bedtime.    cyclobenzaprine (FLEXERIL) 10 MG tablet TAKE 1 TABLET BY MOUTH THREE TIMES A DAY AS NEEDED FOR MUSCLE SPASM   loperamide (IMODIUM) 2 MG capsule Take 4 mg by mouth as needed for diarrhea or loose stools.    methylPREDNISolone (MEDROL DOSEPAK) 4 MG TBPK tablet Use as directed   Multiple Vitamin (MULTIVITAMIN WITH MINERALS) TABS tablet Take 1 tablet by mouth every evening.    tadalafil (CIALIS) 20 MG tablet TAKE 1 TAB BY MOUTH 1 HOUR PRIOR TO INTERCOURSE AS NEEDED   [DISCONTINUED] azithromycin (ZITHROMAX) 250 MG tablet Take 2 tablets (500 mg) on  Day 1,  followed by 1 tablet (250 mg) once daily on Days 2 through 5.   Facility-Administered Encounter Medications as of 08/17/2022  Medication   heparin lock flush 100 unit/mL   sodium chloride flush (NS) 0.9 % injection 10 mL    Allergies (verified) Sulfa antibiotics and Morphine and codeine   History: Past Medical History:  Diagnosis Date   Anemia    Arthritis    Bronchial obstruction 07/2022   Bronchial stenosis, left 06/2022   Cancer (HCC) 2015   rectal, followed by Oncology,  permanent colostomy   Cancer of upper lobe of left lung (HCC) 01/2019   Colostomy in place [facility])    Dyslipidemia    Dyspnea    on exertion   History of kidney stones    Hypertension    Mass of upper lobe of left lung 01/2019   Neuromuscular disorder (HCC)    Pneumonia    Rectal cancer (HCC) 2015   Seasonal allergies    Past Surgical History:  Procedure Laterality Date   CHOLECYSTECTOMY     COLON SURGERY     ENDOBRONCHIAL ULTRASOUND N/A 07/01/2018   Procedure: ENDOBRONCHIAL ULTRASOUND;  Surgeon: Salena Saner, MD;  Location: [facility];  Service: Cardiopulmonary;   Laterality: N/A;   FLEXIBLE BRONCHOSCOPY Left 11/20/2018   Procedure: FLEXIBLE BRONCHOSCOPY;  Surgeon: Salena Saner, MD;  Location: [facility];  Service: Cardiopulmonary;  Laterality: Left;   FLEXIBLE BRONCHOSCOPY Left 12/23/2021   Procedure: FLEXIBLE BRONCHOSCOPY;  Surgeon: Salena Saner, MD;  Location: [facility];  Service: Pulmonary;  Laterality: Left;   FLEXIBLE BRONCHOSCOPY Left 08/09/2022   Procedure: FLEXIBLE BRONCHOSCOPY;  Surgeon: Salena Saner, MD;  Location: [facility];  Service: Pulmonary;  Laterality: Left;   INTERCOSTAL NERVE BLOCK Left 02/03/2019   Procedure: INTERCOSTAL NERVE BLOCK WITH EXPAREL;  Surgeon: Delight Ovens, MD;  Location: [facility] OR;  Service: Thoracic;  Laterality: Left;   KNEE ARTHROSCOPY Right    KNEE SURGERY Left    torn ligaments (staple in place)   laceration of wrist Right    LOBECTOMY Left 02/03/2019   Procedure: LEFT UPPER LOBECTOMY WITH LYMPH NODE DISSECTION;  Surgeon: Delight Ovens, MD;  Location: [facility] OR;  Service: Thoracic;  Laterality: Left;   LUMBAR DISC SURGERY     ruptured disc   PORT A CATH REVISION Left 08/05/2018   Procedure: PORT A CATH REVISION, REPOSITION LEFT;  Surgeon: Earline Mayotte, MD;  Location: [facility];  Service: General;  Laterality: Left;   PORT-A-CATH REMOVAL Right 12/02/2014   Procedure: REMOVAL PORT-A-CATH;  Surgeon: Ida Rogue, MD;  Location: [facility];  Service: General;  Laterality: Right;   PORTACATH PLACEMENT     PORTACATH PLACEMENT Left 07/29/2018   Procedure: INSERTION PORT-A-CATH LEFT;  Surgeon: Earline Mayotte, MD;  Location: [facility];  Service: General;  Laterality: Left;   RECTAL BIOPSY  04/30/2013   RIGID BRONCHOSCOPY Left 08/09/2022   Procedure: RIGID BRONCHOSCOPY;  Surgeon: Salena Saner, MD;  Location: [facility];  Service: Pulmonary;  Laterality: Left;   THORACOTOMY/LOBECTOMY Left 02/03/2019   Procedure: MINITHORACOTOMY;  Surgeon: Delight Ovens, MD;  Location: [facility] OR;   Service: Thoracic;  Laterality: Left;   VIDEO ASSISTED THORACOSCOPY (VATS)/WEDGE RESECTION Left 02/03/2019   Procedure: VIDEO ASSISTED THORACOSCOPY (VATS)/LUNG RESECTION;  Surgeon: Delight Ovens, MD;  Location: [facility] OR;  Service: Thoracic;  Laterality: Left;   VIDEO BRONCHOSCOPY N/A 02/03/2019   Procedure: VIDEO BRONCHOSCOPY;  Surgeon: Delight Ovens, MD;  Location: [facility] OR;  Service: Thoracic;  Laterality: N/A;   VIDEO BRONCHOSCOPY N/A 10/28/2021   Procedure: VIDEO BRONCHOSCOPY WITH CRYO and DEBULKING;  Surgeon: Salena Saner, MD;  Location: [facility];  Service: Cardiopulmonary;  Laterality: N/A;   VIDEO BRONCHOSCOPY WITH ENDOBRONCHIAL ULTRASOUND N/A 12/30/2018   Procedure: VIDEO BRONCHOSCOPY WITH ENDOBRONCHIAL ULTRASOUND;  Surgeon: Delight Ovens, MD;  Location: [facility] OR;  Service: Thoracic;  Laterality: N/A;   XI ROBOT ABDOMINAL PERINEAL RESECTION  10/09/2013   Family History  Problem Relation Age of Onset   Cancer  Mother    Hypertension Mother    COPD Mother    COPD Father    Cancer Father        Prostate CA, on Lupron   Stroke Maternal Grandmother    Cancer Paternal Grandmother    Heart attack Paternal Grandfather    Cancer Paternal Grandfather    Social History   Socioeconomic History   Marital status: Married    Spouse name: Rubin Payor   Number of children: 1   Years of education: Not on file   Highest education level: Not on file  Occupational History   Not on file  Tobacco Use   Smoking status: Former    Types: Cigars   Smokeless tobacco: Current    Types: Chew   Tobacco comments:    No Cigars. Does Dip. Khj 07/19/2022  Vaping Use   Vaping status: Never Used  Substance and Sexual Activity   Alcohol use: Yes    Alcohol/week: 0.0 standard drinks of alcohol    Comment: RARE   Drug use: No   Sexual activity: Yes    Partners: Female  Other Topics Concern   Not on file  Social History Narrative   Not on file   Social Determinants of Health   Financial  Resource Strain: Low Risk  (08/17/2022)   Overall Financial Resource Strain (CARDIA)    Difficulty of Paying Living Expenses: Not hard at all  Food Insecurity: No Food Insecurity (08/17/2022)   Hunger Vital Sign    Worried About Running Out of Food in the Last Year: Never true    Ran Out of Food in the Last Year: Never true  Transportation Needs: No Transportation Needs (08/17/2022)   PRAPARE - Administrator, Civil Service (Medical): No    Lack of Transportation (Non-Medical): No  Physical Activity: Inactive (08/17/2022)   Exercise Vital Sign    Days of Exercise per Week: 0 days    Minutes of Exercise per Session: 0 min  Stress: No Stress Concern Present (08/17/2022)   [employer] of Occupational Health - Occupational Stress Questionnaire    Feeling of Stress : Not at all  Social Connections: Socially Integrated (08/17/2022)   Social Connection and Isolation Panel [NHANES]    Frequency of Communication with Friends and Family: More than three times a week    Frequency of Social Gatherings with Friends and Family: More than three times a week    Attends Religious Services: More than 4 times per year    Active Member of [employer] or Organizations: Yes    Attends Engineer, structural: More than 4 times per year    Marital Status: Married    Tobacco Counseling Ready to quit: No Counseling given: Yes Tobacco comments: No Cigars. Does Dip. Khj 07/19/2022   Clinical Intake:  Pre-visit preparation completed: No  Pain : No/denies pain Pain Score: 0-No pain     BMI - recorded: 33.5 Nutritional Status: BMI > 30  Obese Nutritional Risks: None Diabetes: No  How often do you need to have someone help you when you read instructions, pamphlets, or other written materials from your doctor or pharmacy?: 1 - Never  Interpreter Needed?: No  Information entered by :: Theresa Mulligan LPN   Activities of Daily Living    08/17/2022   10:37 AM 08/02/2022    9:23 AM  In  your present state of health, do you have any difficulty performing the following activities:  Hearing? 0   Vision? 0  Difficulty concentrating or making decisions? 0   Walking or climbing stairs? 0   Dressing or bathing? 0   Doing errands, shopping? 0 0  Preparing Food and eating ? N   Using the Toilet? N   In the past six months, have you accidently leaked urine? N   Do you have problems with loss of bowel control? N   Managing your Medications? N   Managing your Finances? N   Housekeeping or managing your Housekeeping? N     Patient Care Team: Alba Cory, MD as PCP - General (Family Medicine) Jeralyn Ruths, MD as Consulting Physician (Oncology) Riki Altes, MD (Urology) Nevada Crane, MD as Consulting Physician (Ophthalmology) Alba Cory, MD as Attending Physician (Family Medicine) Salena Saner, MD as Consulting Physician (Pulmonary Disease) Cardiovascular, [place]  Indicate any recent Medical Services you may have received from other than [facility] providers in the past year (date may be approximate).     Assessment:   This is a routine wellness examination for Shlome.  Hearing/Vision screen Hearing Screening - Comments:: Denies hearing difficulties   Vision Screening - Comments:: Wears rx glasses - up to date with routine eye exams with  [facility]  Dietary issues and exercise activities discussed:     Goals Addressed               This Visit's Progress     Lose about 10 more lbs (pt-stated)         Depression Screen    08/17/2022   10:36 AM 03/17/2022    2:23 PM 10/12/2021    9:52 AM 04/07/2021    9:06 AM 02/22/2021    9:15 AM 08/11/2020    2:55 PM 10/15/2019   10:32 AM  PHQ 2/9 Scores  PHQ - 2 Score 0 0 0 0 0 0 0  PHQ- 9 Score  0 0 0 0      Fall Risk    08/17/2022   10:37 AM 06/05/2022   12:50 PM 03/17/2022    2:23 PM 10/12/2021    9:51 AM 04/07/2021    9:06 AM  Fall Risk   Falls in the past year? 1 0 0 0 0  Number  falls in past yr: 0  0 0 0  Injury with Fall? 0 0 0 0 0  Risk for fall due to : No Fall Risks  No Fall Risks No Fall Risks No Fall Risks  Follow up Falls prevention discussed  Falls prevention discussed;Education provided;Falls evaluation completed Falls prevention discussed Falls prevention discussed    MEDICARE RISK AT HOME:  Medicare Risk at Home - 08/17/22 1041     Any stairs in or around the home? Yes    If so, are there any without handrails? No    Home free of loose throw rugs in walkways, pet beds, electrical cords, etc? Yes    Adequate lighting in your home to reduce risk of falls? Yes    Life alert? No    Use of a cane, walker or w/c? No    Grab bars in the bathroom? Yes    Shower chair or bench in shower? No    Elevated toilet seat or a handicapped toilet? No             TIMED UP AND GO:  Was the test performed?  No    Cognitive Function:        08/17/2022   10:38 AM  6CIT Screen  What Year? 0 points  What month? 0 points  What time? 0 points  Count back from 20 0 points  Months in reverse 0 points  Repeat phrase 0 points  Total Score 0 points    Immunizations Immunization History  Administered Date(s) Administered   COVID-19, mRNA, vaccine(Comirnaty)12 years and older 02/21/2022   Fluad Quad(high Dose 65+) 10/12/2021   Influenza,inj,Quad PF,6+ Mos 03/06/2017, 09/21/2017, 11/28/2018, 10/15/2019   Influenza-Unspecified 11/17/2020   PFIZER(Purple Top)SARS-COV-2 Vaccination 03/10/2019, 04/09/2019   PNEUMOCOCCAL CONJUGATE-20 10/12/2021   Respiratory Syncytial Virus Vaccine,Recomb Aduvanted(Arexvy) 02/21/2022   Tdap 02/21/2016   Zoster Recombinant(Shingrix) 07/03/2016    TDAP status: Up to date  Flu Vaccine status: Up to date  Pneumococcal vaccine status: Up to date  Covid-19 vaccine status: Completed vaccines  Qualifies for Shingles Vaccine? Yes   Zostavax completed No   Shingrix Completed?: No.    Education has been provided regarding the  importance of this vaccine. Patient has been advised to call insurance company to determine out of pocket expense if they have not yet received this vaccine. Advised may also receive vaccine at local pharmacy or Health Dept. Verbalized acceptance and understanding.  Screening Tests Health Maintenance  Topic Date Due   Colonoscopy  Never done   Zoster Vaccines- Shingrix (2 of 2) 08/28/2016   COVID-19 Vaccine (4 - 2023-24 season) 04/18/2022   INFLUENZA VACCINE  08/24/2022   Medicare Annual Wellness (AWV)  08/17/2023   DTaP/Tdap/Td (2 - Td or Tdap) 02/20/2026   Pneumonia Vaccine 16+ Years old  Completed   Hepatitis C Screening  Completed   HPV VACCINES  Aged Out    Health Maintenance  Health Maintenance Due  Topic Date Due   Colonoscopy  Never done   Zoster Vaccines- Shingrix (2 of 2) 08/28/2016   COVID-19 Vaccine (4 - 2023-24 season) 04/18/2022    Colorectal cancer screening: Referral to GI placed Deferred. Pt aware the office will call re: appt.  Lung Cancer Screening: (Low Dose CT Chest recommended if Age 69-80 years, 20 pack-year currently smoking OR have quit w/in 15years.) does not qualify.     Additional Screening:  Hepatitis C Screening: does qualify; Completed 07/03/16  Vision Screening: Recommended annual ophthalmology exams for early detection of glaucoma and other disorders of the eye. Is the patient up to date with their annual eye exam?  Yes  Who is the provider or what is the name of the office in which the patient attends annual eye exams? [facility] If pt is not established with a provider, would they like to be referred to a provider to establish care? No .   Dental Screening: Recommended annual dental exams for proper oral hygiene   Community Resource Referral / Chronic Care Management:  CRR required this visit?  No   CCM required this visit?  No     Plan:     I have personally reviewed and noted the following in the patient's chart:    Medical and social history Use of alcohol, tobacco or illicit drugs  Current medications and supplements including opioid prescriptions. Patient is not currently taking opioid prescriptions. Functional ability and status Nutritional status Physical activity Advanced directives List of other physicians Hospitalizations, surgeries, and ER visits in previous 12 months Vitals Screenings to include cognitive, depression, and falls Referrals and appointments  In addition, I have reviewed and discussed with patient certain preventive protocols, quality metrics, and best practice recommendations. A written personalized care plan for preventive services as well  as general preventive health recommendations were provided to patient.     Tillie Rung, LPN   [id number]   After Visit Summary: (MyChart) Due to this being a telephonic visit, the after visit summary with patients personalized plan was offered to patient via MyChart   Nurse Notes: None

## 2022-08-17 NOTE — Patient Instructions (Addendum)
[name] [name] , Thank you for taking time to come for your Medicare Wellness Visit. I appreciate your ongoing commitment to your health goals. Please review the following plan we discussed and let me know if I can assist you in the future.   Referrals/Orders/Follow-Ups/Clinician Recommendations:   This is a list of the screening recommended for you and due dates:  Health Maintenance  Topic Date Due   Colon Cancer Screening  Never done   Zoster (Shingles) Vaccine (2 of 2) 08/28/2016   COVID-19 Vaccine (4 - 2023-24 season) 04/18/2022   Flu Shot  08/24/2022   Medicare Annual Wellness Visit  08/17/2023   DTaP/Tdap/Td vaccine (2 - Td or Tdap) 02/20/2026   Pneumonia Vaccine  Completed   Hepatitis C Screening  Completed   HPV Vaccine  Aged Out    Advanced directives: (Declined) Advance directive discussed with you today. Even though you declined this today, please call our office should you change your mind, and we can give you the proper paperwork for you to fill out.  Next Medicare Annual Wellness Visit scheduled for next year: Yes  Preventive Care 70 Years and Older, Male  Preventive care refers to lifestyle choices and visits with your health care provider that can promote health and wellness. What does preventive care include? A yearly physical exam. This is also called an annual well check. Dental exams once or twice a year. Routine eye exams. Ask your health care provider how often you should have your eyes checked. Personal lifestyle choices, including: Daily care of your teeth and gums. Regular physical activity. Eating a healthy diet. Avoiding tobacco and drug use. Limiting alcohol use. Practicing safe sex. Taking low doses of aspirin every day. Taking vitamin and mineral supplements as recommended by your health care provider. What happens during an annual well check? The services and screenings done by your health care provider during your annual well check will depend on  your age, overall health, lifestyle risk factors, and family history of disease. Counseling  Your health care provider may ask you questions about your: Alcohol use. Tobacco use. Drug use. Emotional well-being. Home and relationship well-being. Sexual activity. Eating habits. History of falls. Memory and ability to understand (cognition). Work and work Astronomer. Screening  You may have the following tests or measurements: Height, weight, and BMI. Blood pressure. Lipid and cholesterol levels. These may be checked every 5 years, or more frequently if you are over 60 years old. Skin check. Lung cancer screening. You may have this screening every year starting at age 48 if you have a 30-pack-year history of smoking and currently smoke or have quit within the past 15 years. Fecal occult blood test (FOBT) of the stool. You may have this test every year starting at age 52. Flexible sigmoidoscopy or colonoscopy. You may have a sigmoidoscopy every 5 years or a colonoscopy every 10 years starting at age 35. Prostate cancer screening. Recommendations will vary depending on your family history and other risks. Hepatitis C blood test. Hepatitis B blood test. Sexually transmitted disease (STD) testing. Diabetes screening. This is done by checking your blood sugar (glucose) after you have not eaten for a while (fasting). You may have this done every 1-3 years. Abdominal aortic aneurysm (AAA) screening. You may need this if you are a current or former smoker. Osteoporosis. You may be screened starting at age 42 if you are at high risk. Talk with your health care provider about your test results, treatment options, and if necessary,  the need for more tests. Vaccines  Your health care provider may recommend certain vaccines, such as: Influenza vaccine. This is recommended every year. Tetanus, diphtheria, and acellular pertussis (Tdap, Td) vaccine. You may need a Td booster every 10 years. Zoster  vaccine. You may need this after age 28. Pneumococcal 13-valent conjugate (PCV13) vaccine. One dose is recommended after age 74. Pneumococcal polysaccharide (PPSV23) vaccine. One dose is recommended after age 87. Talk to your health care provider about which screenings and vaccines you need and how often you need them. This information is not intended to replace advice given to you by your health care provider. Make sure you discuss any questions you have with your health care provider. Document Released: 02/05/2015 Document Revised: 09/29/2015 Document Reviewed: 11/10/2014 Elsevier Interactive Patient Education  2017 [employer].  Fall Prevention in the Home Falls can cause injuries. They can happen to people of all ages. There are many things you can do to make your home safe and to help prevent falls. What can I do on the outside of my home? Regularly fix the edges of walkways and driveways and fix any cracks. Remove anything that might make you trip as you walk through a door, such as a raised step or threshold. Trim any bushes or trees on the path to your home. Use bright outdoor lighting. Clear any walking paths of anything that might make someone trip, such as rocks or tools. Regularly check to see if handrails are loose or broken. Make sure that both sides of any steps have handrails. Any raised decks and porches should have guardrails on the edges. Have any leaves, snow, or ice cleared regularly. Use sand or salt on walking paths during winter. Clean up any spills in your garage right away. This includes oil or grease spills. What can I do in the bathroom? Use night lights. Install grab bars by the toilet and in the tub and shower. Do not use towel bars as grab bars. Use non-skid mats or decals in the tub or shower. If you need to sit down in the shower, use a plastic, non-slip stool. Keep the floor dry. Clean up any water that spills on the floor as soon as it happens. Remove  soap buildup in the tub or shower regularly. Attach bath mats securely with double-sided non-slip rug tape. Do not have throw rugs and other things on the floor that can make you trip. What can I do in the bedroom? Use night lights. Make sure that you have a light by your bed that is easy to reach. Do not use any sheets or blankets that are too big for your bed. They should not hang down onto the floor. Have a firm chair that has side arms. You can use this for support while you get dressed. Do not have throw rugs and other things on the floor that can make you trip. What can I do in the kitchen? Clean up any spills right away. Avoid walking on wet floors. Keep items that you use a lot in easy-to-reach places. If you need to reach something above you, use a strong step stool that has a grab bar. Keep electrical cords out of the way. Do not use floor polish or wax that makes floors slippery. If you must use wax, use non-skid floor wax. Do not have throw rugs and other things on the floor that can make you trip. What can I do with my stairs? Do not leave any items on  the stairs. Make sure that there are handrails on both sides of the stairs and use them. Fix handrails that are broken or loose. Make sure that handrails are as long as the stairways. Check any carpeting to make sure that it is firmly attached to the stairs. Fix any carpet that is loose or worn. Avoid having throw rugs at the top or bottom of the stairs. If you do have throw rugs, attach them to the floor with carpet tape. Make sure that you have a light switch at the top of the stairs and the bottom of the stairs. If you do not have them, ask someone to add them for you. What else can I do to help prevent falls? Wear shoes that: Do not have high heels. Have rubber bottoms. Are comfortable and fit you well. Are closed at the toe. Do not wear sandals. If you use a stepladder: Make sure that it is fully opened. Do not climb a  closed stepladder. Make sure that both sides of the stepladder are locked into place. Ask someone to hold it for you, if possible. Clearly mark and make sure that you can see: Any grab bars or handrails. First and last steps. Where the edge of each step is. Use tools that help you move around (mobility aids) if they are needed. These include: Canes. Walkers. Scooters. Crutches. Turn on the lights when you go into a dark area. Replace any light bulbs as soon as they burn out. Set up your furniture so you have a clear path. Avoid moving your furniture around. If any of your floors are uneven, fix them. If there are any pets around you, be aware of where they are. Review your medicines with your doctor. Some medicines can make you feel dizzy. This can increase your chance of falling. Ask your doctor what other things that you can do to help prevent falls. This information is not intended to replace advice given to you by your health care provider. Make sure you discuss any questions you have with your health care provider. Document Released: 11/05/2008 Document Revised: 06/17/2015 Document Reviewed: 02/13/2014 Elsevier Interactive Patient Education  2017 [employer].

## 2022-08-18 ENCOUNTER — Other Ambulatory Visit (HOSPITAL_COMMUNITY): Payer: Self-pay

## 2022-08-18 MED ORDER — FLUTICASONE-SALMETEROL 250-50 MCG/ACT IN AEPB: 1.0000 | INHALATION_SPRAY | Freq: Two times a day (BID) | RESPIRATORY_TRACT | 5 refills | Status: DC

## 2022-08-18 NOTE — Telephone Encounter (Signed)
Per test claims Virgel Bouquet is not covered, Wixela/generic Advair Diskus is covered at this time for a $5.00 co-pay

## 2022-08-18 NOTE — Telephone Encounter (Signed)
Lets do Advair discus 250/51 inhalation twice a day.

## 2022-08-18 NOTE — Telephone Encounter (Signed)
[name] [name] has been sent in and I notified the patient.  Nothing further needed.

## 2022-08-21 ENCOUNTER — Other Ambulatory Visit (HOSPITAL_COMMUNITY): Payer: Self-pay

## 2022-08-21 NOTE — Telephone Encounter (Signed)
Generic Adviar Diskus is covered, per test claim this is being filled at [facility] and is too early to refill until 08/26/2022

## 2022-08-21 NOTE — Telephone Encounter (Signed)
Please see 08/15/2022 phone note.

## 2022-08-23 DIAGNOSIS — Z933 Colostomy status: Secondary | ICD-10-CM | POA: Diagnosis not present

## 2022-08-23 NOTE — Progress Notes (Unsigned)
Name: [name] [name]   MRN: [id number]    DOB: January 12, 1956   Date:08/24/2022       Progress Note  Subjective  Chief Complaint  Annual exam  HPI  Patient presents for annual CPE.  IPSS Questionnaire (AUA-7): Over the past month.   1)  How often have you had a sensation of not emptying your bladder completely after you finish urinating?  0 - Not at all  2)  How often have you had to urinate again less than two hours after you finished urinating? 1 - Less than 1 time in 5  3)  How often have you found you stopped and started again several times when you urinated?  1 - Less than 1 time in 5  4) How difficult have you found it to postpone urination?  0 - Not at all  5) How often have you had a weak urinary stream?  0 - Not at all  6) How often have you had to push or strain to begin urination?  0 - Not at all  7) How many times did you most typically get up to urinate from the time you went to bed until the time you got up in the morning?  1 - 1 time  Total score:  0-7 mildly symptomatic   8-19 moderately symptomatic   20-35 severely symptomatic     Diet: usually eats a balanced diet except during baseball season when he eats on the go  Exercise: he takes care of his yard, discussed continues physical activity  Last Dental Exam: advised to go back  Last Eye Exam: up to date  Depression: phq 9 is negative    08/24/2022   10:43 AM 08/17/2022   10:36 AM 03/17/2022    2:23 PM 10/12/2021    9:52 AM 04/07/2021    9:06 AM  Depression screen PHQ 2/9  Decreased Interest 0 0 0 0 0  Down, Depressed, Hopeless 0 0 0 0 0  PHQ - 2 Score 0 0 0 0 0  Altered sleeping 0  0 0 0  Tired, decreased energy 0  0 0 0  Change in appetite 0  0 0 0  Feeling bad or failure about yourself  0  0 0 0  Trouble concentrating 0  0 0 0  Moving slowly or fidgety/restless 0  0 0 0  Suicidal thoughts 0  0 0 0  PHQ-9 Score 0  0 0 0  Difficult doing work/chores   Not difficult at all      Hypertension:  BP Readings  from Last 3 Encounters:  08/24/22 132/70  08/09/22 124/73  07/19/22 130/80    Obesity: Wt Readings from Last 3 Encounters:  08/24/22 270 lb 11.2 oz (122.8 kg)  08/17/22 268 lb (121.6 kg)  08/09/22 268 lb (121.6 kg)   BMI Readings from Last 3 Encounters:  08/24/22 33.84 kg/m  08/17/22 33.50 kg/m  08/09/22 33.50 kg/m     Lipids:  Lab Results  Component Value Date   CHOL 147 03/22/2018   CHOL 185 02/20/2017   CHOL 188 10/11/2016   Lab Results  Component Value Date   HDL 35 (L) 03/22/2018   HDL 39 (L) 02/20/2017   HDL 40 (L) 10/11/2016   Lab Results  Component Value Date   LDLCALC 89 03/22/2018   LDLCALC 117 (H) 02/20/2017   LDLCALC 118 (H) 10/11/2016   Lab Results  Component Value Date   TRIG 126 03/22/2018   TRIG  176 (H) 02/20/2017   TRIG 189 (H) 10/11/2016   Lab Results  Component Value Date   CHOLHDL 4.2 03/22/2018   CHOLHDL 4.7 02/20/2017   CHOLHDL 4.7 10/11/2016   No results found for: "LDLDIRECT" Glucose:  Glucose  Date Value Ref Range Status  12/25/2013 116 (H) 65 - 99 mg/dL Final  21/30/8657 846 (H) 65 - 99 mg/dL Final  96/29/5284 132 (H) 65 - 99 mg/dL Final   Glucose, Bld  Date Value Ref Range Status  08/04/2022 95 70 - 99 mg/dL Final    Comment:    Glucose reference range applies only to samples taken after fasting for at least 8 hours.  04/04/2022 101 (H) 70 - 99 mg/dL Final    Comment:    Glucose reference range applies only to samples taken after fasting for at least 8 hours.  11/01/2021 95 70 - 99 mg/dL Final    Comment:    Glucose reference range applies only to samples taken after fasting for at least 8 hours.   Glucose-Capillary  Date Value Ref Range Status  11/04/2018 111 (H) 70 - 99 mg/dL Final  44/01/270 94 70 - 99 mg/dL Final    Flowsheet Row Office Visit from 08/24/20[facility] in [facility]  AUDIT-C Score 2       Married STD testing and prevention (HIV/chl/gon/syphilis):  no Sexual history:  one partner, wife , uses Cialis  Hep C Screening: up to date  Skin cancer: Discussed monitoring for atypical lesions Colorectal cancer: Not Completed Prostate cancer: monitored by oncologist     Lung cancer:  Low Dose CT Chest recommended if Age 32-80 years, 30 pack-year currently smoking OR have quit w/in 15years. Patient  is not  a candidate for screening   AAA: The USPSTF recommends one-time screening with ultrasonography in men ages 71 to 75 years who have ever smoked. Patient is not  candidate for screening - never smoked cigarettes  ECG:  08/10/2022  Vaccines:   RSV: up to date Tdap: 02/21/2016 Shingrix: only had one , needs to go back for second  Pneumonia: Completed Flu: yearly  COVID-19:up to date  Advanced Care Planning: A voluntary discussion about advance care planning including the explanation and discussion of advance directives.  Discussed health care proxy and Living will, and the patient was able to identify a health care proxy as wife .  Patient does not have a living will and power of attorney of health care   Patient Active Problem List   Diagnosis Date Noted   Bronchial obstruction 12/23/2021   Excessive granulation tissue    S/P lobectomy of lung 04/07/2021   Metastatic cancer to lung (HCC) 08/11/2020   Essential hypertension 08/11/2020   Dyslipidemia 08/11/2020   Spasm of muscle of lower back 08/11/2020   Peyronie's disease 10/15/2019   Tobacco use 10/15/2019   Glaucoma of right eye 03/22/2018   Erectile dysfunction 03/22/2018   Decreased vision of right eye 09/21/2017   Colostomy in place [facility]) 09/21/2017   Hypogonadism in male 09/21/2017   Perennial allergic rhinitis with seasonal variation    Degeneration of lumbar intervertebral disc 06/26/2017   Rectal cancer (HCC) 08/05/2014    Past Surgical History:  Procedure Laterality Date   CHOLECYSTECTOMY     COLON SURGERY     ENDOBRONCHIAL ULTRASOUND N/A 07/01/2018   Procedure: ENDOBRONCHIAL  ULTRASOUND;  Surgeon: Salena Saner, MD;  Location: [facility] ORS;  Service: Cardiopulmonary;  Laterality: N/A;   FLEXIBLE BRONCHOSCOPY Left 11/20/2018  Procedure: FLEXIBLE BRONCHOSCOPY;  Surgeon: Salena Saner, MD;  Location: [facility];  Service: Cardiopulmonary;  Laterality: Left;   FLEXIBLE BRONCHOSCOPY Left 12/23/2021   Procedure: FLEXIBLE BRONCHOSCOPY;  Surgeon: Salena Saner, MD;  Location: [facility];  Service: Pulmonary;  Laterality: Left;   FLEXIBLE BRONCHOSCOPY Left 08/09/2022   Procedure: FLEXIBLE BRONCHOSCOPY;  Surgeon: Salena Saner, MD;  Location: [facility];  Service: Pulmonary;  Laterality: Left;   INTERCOSTAL NERVE BLOCK Left 02/03/2019   Procedure: INTERCOSTAL NERVE BLOCK WITH EXPAREL;  Surgeon: Delight Ovens, MD;  Location: [facility] OR;  Service: Thoracic;  Laterality: Left;   KNEE ARTHROSCOPY Right    KNEE SURGERY Left    torn ligaments (staple in place)   laceration of wrist Right    LOBECTOMY Left 02/03/2019   Procedure: LEFT UPPER LOBECTOMY WITH LYMPH NODE DISSECTION;  Surgeon: Delight Ovens, MD;  Location: [facility] OR;  Service: Thoracic;  Laterality: Left;   LUMBAR DISC SURGERY     ruptured disc   PORT A CATH REVISION Left 08/05/2018   Procedure: PORT A CATH REVISION, REPOSITION LEFT;  Surgeon: Earline Mayotte, MD;  Location: [facility];  Service: General;  Laterality: Left;   PORT-A-CATH REMOVAL Right 12/02/2014   Procedure: REMOVAL PORT-A-CATH;  Surgeon: Ida Rogue, MD;  Location: [facility];  Service: General;  Laterality: Right;   PORTACATH PLACEMENT     PORTACATH PLACEMENT Left 07/29/2018   Procedure: INSERTION PORT-A-CATH LEFT;  Surgeon: Earline Mayotte, MD;  Location: [facility];  Service: General;  Laterality: Left;   RECTAL BIOPSY  04/30/2013   RIGID BRONCHOSCOPY Left 08/09/2022   Procedure: RIGID BRONCHOSCOPY;  Surgeon: Salena Saner, MD;  Location: [facility];  Service: Pulmonary;  Laterality: Left;   THORACOTOMY/LOBECTOMY Left  02/03/2019   Procedure: MINITHORACOTOMY;  Surgeon: Delight Ovens, MD;  Location[facility] OR;  Service: Thoracic;  Laterality: Left;   VIDEO ASSISTED THORACOSCOPY (VATS)/WEDGE RESECTION Left 02/03/2019   Procedure: VIDEO ASSISTED THORACOSCOPY (VATS)/LUNG RESECTION;  Surgeon: Delight Ovens, MD;  Location: [facility] OR;  Service: Thoracic;  Laterality: Left;   VIDEO BRONCHOSCOPY N/A 02/03/2019   Procedure: VIDEO BRONCHOSCOPY;  Surgeon: Delight Ovens, MD;  Location: [facility] OR;  Service: Thoracic;  Laterality: N/A;   VIDEO BRONCHOSCOPY N/A 10/28/2021   Procedure: VIDEO BRONCHOSCOPY WITH CRYO and DEBULKING;  Surgeon: Salena Saner, MD;  Location: [facility];  Service: Cardiopulmonary;  Laterality: N/A;   VIDEO BRONCHOSCOPY WITH ENDOBRONCHIAL ULTRASOUND N/A 12/30/2018   Procedure: VIDEO BRONCHOSCOPY WITH ENDOBRONCHIAL ULTRASOUND;  Surgeon: Delight Ovens, MD;  Location: [facility] OR;  Service: Thoracic;  Laterality: N/A;   XI ROBOT ABDOMINAL PERINEAL RESECTION  10/09/2013    Family History  Problem Relation Age of Onset   Cancer Mother    Hypertension Mother    COPD Mother    COPD Father    Cancer Father        Prostate CA, on Lupron   Stroke Maternal Grandmother    Cancer Paternal Grandmother    Heart attack Paternal Grandfather    Cancer Paternal Grandfather     Social History   Socioeconomic History   Marital status: Married    Spouse name: Rubin Payor   Number of children: 1   Years of education: Not on file   Highest education level: Not on file  Occupational History   Not on file  Tobacco Use   Smoking status: Former    Types: Cigars   Smokeless tobacco: Current    Types:  Chew   Tobacco comments:    No Cigars. Does Dip. Khj 07/19/2022  Vaping Use   Vaping status: Never Used  Substance and Sexual Activity   Alcohol use: Yes    Alcohol/week: 0.0 standard drinks of alcohol    Comment: RARE   Drug use: No   Sexual activity: Yes    Partners: Female  Other Topics Concern   Not  on file  Social History Narrative   Not on file   Social Determinants of Health   Financial Resource Strain: Low Risk  (08/17/2022)   Overall Financial Resource Strain (CARDIA)    Difficulty of Paying Living Expenses: Not hard at all  Food Insecurity: No Food Insecurity (08/17/2022)   Hunger Vital Sign    Worried About Running Out of Food in the Last Year: Never true    Ran Out of Food in the Last Year: Never true  Transportation Needs: No Transportation Needs (08/17/2022)   PRAPARE - Administrator, Civil Service (Medical): No    Lack of Transportation (Non-Medical): No  Physical Activity: Inactive (08/17/2022)   Exercise Vital Sign    Days of Exercise per Week: 0 days    Minutes of Exercise per Session: 0 min  Stress: No Stress Concern Present (08/17/2022)   [employer] of Occupational Health - Occupational Stress Questionnaire    Feeling of Stress : Not at all  Social Connections: Socially Integrated (08/17/2022)   Social Connection and Isolation Panel [NHANES]    Frequency of Communication with Friends and Family: More than three times a week    Frequency of Social Gatherings with Friends and Family: More than three times a week    Attends Religious Services: More than 4 times per year    Active Member of [employer] or Organizations: Yes    Attends Banker Meetings: More than 4 times per year    Marital Status: Married  Catering manager Violence: Not At Risk (08/17/2022)   Humiliation, Afraid, Rape, and Kick questionnaire    Fear of Current or Ex-Partner: No    Emotionally Abused: No    Physically Abused: No    Sexually Abused: No     Current Outpatient Medications:    acetaminophen (TYLENOL) 500 MG tablet, Take 1,000 mg by mouth every 6 (six) hours as needed for mild pain., Disp: , Rfl:    albuterol (VENTOLIN HFA) 108 (90 Base) MCG/ACT inhaler, INHALE 2 PUFFS BY MOUTH INTO THE LUNGS EVERY 6 HOURS AS NEEDED FOR WHEEZING OR SHORTNESS OF BREATH, Disp: 18  each, Rfl: 1   amLODipine (NORVASC) 10 MG tablet, TAKE 1 TABLET BY MOUTH EVERY DAY, Disp: 90 tablet, Rfl: 0   calcium carbonate (TUMS - DOSED IN MG ELEMENTAL CALCIUM) 500 MG chewable tablet, Chew 2 tablets by mouth as needed for indigestion or heartburn., Disp: , Rfl:    cetirizine (ZYRTEC) 10 MG tablet, Take 10 mg by mouth at bedtime. , Disp: , Rfl:    fluticasone-salmeterol (WIXELA INHUB) 250-50 MCG/ACT AEPB, Inhale 1 puff into the lungs in the morning and at bedtime., Disp: 60 each, Rfl: 5   loperamide (IMODIUM) 2 MG capsule, Take 4 mg by mouth as needed for diarrhea or loose stools. , Disp: , Rfl:    Multiple Vitamin (MULTIVITAMIN WITH MINERALS) TABS tablet, Take 1 tablet by mouth every evening. , Disp: , Rfl:    tadalafil (CIALIS) 20 MG tablet, TAKE 1 TAB BY MOUTH 1 HOUR PRIOR TO INTERCOURSE AS NEEDED, Disp: 30  tablet, Rfl: 3   cyclobenzaprine (FLEXERIL) 10 MG tablet, TAKE 1 TABLET BY MOUTH THREE TIMES A DAY AS NEEDED FOR MUSCLE SPASM (Patient not taking: Reported on 08/24/2022), Disp: 90 tablet, Rfl: 0 No current facility-administered medications for this visit.  Facility-Administered Medications Ordered in Other Visits:    heparin lock flush 100 unit/mL, 500 Units, Intravenous, Once, Finnegan, Tollie Pizza, MD   sodium chloride flush (NS) 0.9 % injection 10 mL, 10 mL, Intravenous, Once, Finnegan, Tollie Pizza, MD  Allergies  Allergen Reactions   Sulfa Antibiotics Hives   Morphine And Codeine Other (See Comments)    Altered mental status     ROS  Constitutional: Negative for fever or weight change.  Respiratory: Negative for cough and shortness of breath.   Cardiovascular: Negative for chest pain or palpitations.  Gastrointestinal: Negative for abdominal pain, no bowel changes.  Musculoskeletal: Negative for gait problem or joint swelling.  Skin: Negative for rash.  Neurological: Negative for dizziness or headache.  No other specific complaints in a complete review of systems (except as  listed in HPI above).    Objective  Vitals:   08/24/22 1030  BP: 132/70  Pulse: 97  Resp: 16  Temp: 97.9 F (36.6 C)  TempSrc: Oral  SpO2: 96%  Weight: 270 lb 11.2 oz (122.8 kg)  Height: 6\' 3"  (1.905 m)    Body mass index is 33.84 kg/m.  Physical Exam  Constitutional: Patient appears well-developed and well-nourished. No distress.  HENT: Head: Normocephalic and atraumatic. Ears: B TMs ok, no erythema or effusion; Nose: Nose normal. Mouth/Throat: Oropharynx is clear and moist. No oropharyngeal exudate.  Eyes: Conjunctivae injected bilaterally.  EOM are normal. Pupils are equal, round, and reactive to light. No scleral icterus.  Neck: Normal range of motion. Neck supple. No JVD present. No thyromegaly present.  Cardiovascular: Normal rate, regular rhythm and normal heart sounds.  No murmur heard. No BLE edema. Pulmonary/Chest: Effort normal and breath sounds normal. No respiratory distress. Abdominal: Soft. Bowel sounds are normal, no distension. There is no tenderness. no masses MALE GENITALIA: Normal descended testes bilaterally, no masses palpated, small right inguinal hernia - direct  no lesions, no discharge RECTAL: not done  Musculoskeletal: Normal range of motion, no joint effusions. No gross deformities Neurological: he is alert and oriented to person, place, and time. No cranial nerve deficit. Coordination, balance, strength, speech and gait are normal.  Skin: Skin is warm and dry. senile purpura  Psychiatric: Patient has a normal mood and affect. behavior is normal. Judgment and thought content normal.   Recent Results (from the past 2160 hour(s))  Basic metabolic panel per protocol     Status: Abnormal   Collection Time: 08/04/22 10:33 AM  Result Value Ref Range   Sodium 139 135 - 145 mmol/L   Potassium 3.4 (L) 3.5 - 5.1 mmol/L   Chloride 109 98 - 111 mmol/L   CO2 22 22 - 32 mmol/L   Glucose, Bld 95 70 - 99 mg/dL    Comment: Glucose reference range applies only  to samples taken after fasting for at least 8 hours.   BUN 19 8 - 23 mg/dL   Creatinine, Ser 9.89 0.61 - 1.24 mg/dL   Calcium 8.7 (L) 8.9 - 10.3 mg/dL   GFR, Estimated >21 >19 mL/min    Comment: (NOTE) Calculated using the CKD-EPI Creatinine Equation (2021)    Anion gap 8 5 - 15    Comment: Performed at [facility], [address]  [place], [place] [address]  CBC per protocol     Status: Abnormal   Collection Time: 08/04/22 10:33 AM  Result Value Ref Range   WBC 3.2 (L) 4.0 - 10.5 K/uL   RBC 4.68 4.22 - 5.81 MIL/uL   Hemoglobin 13.8 13.0 - 17.0 g/dL   HCT 08.6 57.8 - 46.9 %   MCV 85.3 80.0 - 100.0 fL   MCH 29.5 26.0 - 34.0 pg   MCHC 34.6 30.0 - 36.0 g/dL   RDW 62.9 52.8 - 41.3 %   Platelets 359 150 - 400 K/uL   nRBC 0.0 0.0 - 0.2 %    Comment: Performed at [facility], [address], [place], [place] [address]     Fall Risk:    08/24/2022   10:43 AM 08/17/2022   10:37 AM 06/05/2022   12:50 PM 03/17/2022    2:23 PM 10/12/2021    9:51 AM  Fall Risk   Falls in the past year? 1 1 0 0 0  Number falls in past yr: 0 0  0 0  Injury with Fall? 0 0 0 0 0  Risk for fall due to : History of fall(s) No Fall Risks  No Fall Risks No Fall Risks  Follow up Falls evaluation completed;Education provided;Falls prevention discussed Falls prevention discussed  Falls prevention discussed;Education provided;Falls evaluation completed Falls prevention discussed     Functional Status Survey: Is the patient deaf or have difficulty hearing?: No Does the patient have difficulty seeing, even when wearing glasses/contacts?: No Does the patient have difficulty concentrating, remembering, or making decisions?: No Does the patient have difficulty walking or climbing stairs?: No Does the patient have difficulty dressing or bathing?: No Does the patient have difficulty doing errands alone such as visiting a doctor's office or shopping?: No    Assessment & Plan   1. Well adult  exam  Needs to do regular physical activity  2. Colon cancer screening  He is not interested at this time, he has CEA done by Dr. Orlie Dakin due to personal history of rectal cancer and does not want to do the prep since he has a colostomy bag    -Prostate cancer screening and PSA options (with potential risks and benefits of testing vs not testing) were discussed along with recent recs/guidelines. -USPSTF grade A and B recommendations reviewed with patient; age-appropriate recommendations, preventive care, screening tests, etc discussed and encouraged; healthy living encouraged; see AVS for patient education given to patient -Discussed importance of 150 minutes of physical activity weekly, eat two servings of fish weekly, eat one serving of tree nuts ( cashews, pistachios, pecans, almonds.[name]) every other day, eat 6 servings of fruit/vegetables daily and drink plenty of water and avoid sweet beverages.  -Reviewed Health Maintenance: yes

## 2022-08-24 ENCOUNTER — Ambulatory Visit (INDEPENDENT_AMBULATORY_CARE_PROVIDER_SITE_OTHER): Payer: PPO | Admitting: Family Medicine

## 2022-08-24 ENCOUNTER — Encounter: Payer: Self-pay | Admitting: Family Medicine

## 2022-08-24 VITALS — BP 132/70 | HR 97 | Temp 97.9°F | Resp 16 | Ht 75.0 in | Wt 270.7 lb

## 2022-08-24 DIAGNOSIS — Z1211 Encounter for screening for malignant neoplasm of colon: Secondary | ICD-10-CM | POA: Diagnosis not present

## 2022-08-24 DIAGNOSIS — Z0000 Encounter for general adult medical examination without abnormal findings: Secondary | ICD-10-CM

## 2022-08-25 DIAGNOSIS — Z933 Colostomy status: Secondary | ICD-10-CM | POA: Diagnosis not present

## 2022-08-31 ENCOUNTER — Other Ambulatory Visit: Payer: Self-pay | Admitting: Oncology

## 2022-08-31 DIAGNOSIS — J209 Acute bronchitis, unspecified: Secondary | ICD-10-CM

## 2022-08-31 DIAGNOSIS — J411 Mucopurulent chronic bronchitis: Secondary | ICD-10-CM

## 2022-09-04 NOTE — Telephone Encounter (Signed)
Copied from CRM [id number]. Topic: General - Other >> Sep 04, 2022 10:52 AM Everette C wrote: Reason for CRM: The patient has called to request an order change for their ostomy supplies   The patient needs 4 bottles a month of Coloplast submitted to [facility]  Please contact the patient further when possible to confirm submission, per patient request

## 2022-09-10 ENCOUNTER — Other Ambulatory Visit: Payer: Self-pay | Admitting: Family Medicine

## 2022-09-19 ENCOUNTER — Encounter: Payer: Self-pay | Admitting: Pulmonary Disease

## 2022-09-21 ENCOUNTER — Ambulatory Visit: Payer: PPO | Admitting: Pulmonary Disease

## 2022-09-23 DIAGNOSIS — Z933 Colostomy status: Secondary | ICD-10-CM | POA: Diagnosis not present

## 2022-09-26 NOTE — Progress Notes (Unsigned)
No show

## 2022-09-27 ENCOUNTER — Encounter: Payer: Self-pay | Admitting: Family Medicine

## 2022-09-27 ENCOUNTER — Telehealth (INDEPENDENT_AMBULATORY_CARE_PROVIDER_SITE_OTHER): Payer: Self-pay | Admitting: Family Medicine

## 2022-09-27 ENCOUNTER — Telehealth: Payer: Self-pay

## 2022-09-27 DIAGNOSIS — Z91199 Patient's noncompliance with other medical treatment and regimen due to unspecified reason: Secondary | ICD-10-CM

## 2022-09-27 NOTE — Telephone Encounter (Signed)
Attempted to reach patient at various time beginning at 0830 to triage for virtual visit. Left voicemail messages and patient has not responded/returned calls.

## 2022-10-04 ENCOUNTER — Encounter: Payer: Self-pay | Admitting: Pulmonary Disease

## 2022-10-04 ENCOUNTER — Ambulatory Visit: Payer: PPO | Admitting: Pulmonary Disease

## 2022-10-04 VITALS — BP 140/78 | HR 93 | Temp 98.3°F | Ht 75.0 in | Wt 274.4 lb

## 2022-10-04 DIAGNOSIS — J4489 Other specified chronic obstructive pulmonary disease: Secondary | ICD-10-CM

## 2022-10-04 DIAGNOSIS — R0602 Shortness of breath: Secondary | ICD-10-CM

## 2022-10-04 DIAGNOSIS — J9809 Other diseases of bronchus, not elsewhere classified: Secondary | ICD-10-CM

## 2022-10-04 DIAGNOSIS — C7802 Secondary malignant neoplasm of left lung: Secondary | ICD-10-CM

## 2022-10-04 NOTE — Progress Notes (Signed)
Subjective:    Patient ID: [name] [name], male    DOB: 07-18-1955, 67 y.o.   MRN: [id number]  Patient Care Team: Alba Cory, MD as PCP - General (Family Medicine) Jeralyn Ruths, MD as Consulting Physician (Oncology) Riki Altes, MD (Urology) Nevada Crane, MD as Consulting Physician (Ophthalmology) Alba Cory, MD as Attending Physician (Family Medicine) Salena Saner, MD as Consulting Physician (Pulmonary Disease) Cardiovascular, [facility]  Chief Complaint  Patient presents with   Follow-up    DOE. Dry cough. No wheezing.    HPI [name] [name] is a 67 year old lifelong never smoker, albeit uses chewing tobacco, who follows after evaluation of a recurrent cough.  I last saw the patient on 09 August 2022 at that time we I performed bronchoscopy with balloon dilation and spray cryotherapy to an area of stenosis on his bronchial stump on the left.  Recall that the patient has a history of rectal carcinoma of the anal verge. He developed a left lung mass and required left upper lobectomy on February 03, 2019.  This was a lone metastatic deposit from adenocarcinoma. After that at procedure his cough had resolved.  However, over time he noted recurrence of a dry cough. No hemoptysis. No specific triggers. Nothing seemed to alleviate it. He noted shortness of breath when walking on inclines or when carrying a load.  This has been present since his surgery in 2021. The patient underwent bronchoscopy on 28 October 2021 and it was noted that he had what appeared to be granulation tissue at this site of his prior lobectomy with a ball-valve type of mechanism occluding his left mainstem bronchus almost completely on exhalation.  He underwent spray cryotherapy for ablation of this granulation tissue.  After the initial procedure he noted marked improvement in his cough and shortness of breath. Cryotherapy ablation is usually done in 3 sessions.  Usually 3 to 4 weeks apart.  He had the  second session performed on 23 December 2021.  That procedure was more involved thus we performed cryotherapy and balloon dilation of the airway.  Airway patency was markedly improved after procedure.  We repeated biopsies during the December procedure and these were negative for recurrent carcinoma.  Previously he noted that since the second procedure he continued to do well. He does not note the degree of dyspnea he previously experienced.  He only notices dyspnea if he is carrying an unusually heavy load. 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  He had CT chest of abdomen and pelvis on 12 March, I have reviewed the films independently and the area in question to has been treated on the left looked fairly patent at that time.  The patient then saw me on 19 July 2022 and at that time noted increased shortness of breath and "wheezing" particularly on exertion.  Cough hide also returned.  He underwent balloon dilation and cryoablation again on 17 July as noted above.  He tolerated the procedure well.  During that procedure it was noted that he had what appeared to have been an acute bronchitis and also copious secretions that were very thick.  He was treated with azithromycin and prednisone and his Breztri inhaler was switched to Advair 250/50 1 inhalation twice a day.  It appears that he LAMA component and Breztri was causing tenacious secretions.  Since he has had bad change  he notes marked improvement on his shortness of breath.  Cough is now nonproductive and rare and notes no wheezing.  He has not had any chest pain.  No lower extremity edema.  No calf tenderness.  No fevers, chills or sweats.  No sputum production or hemoptysis.  Appetite and weight have been stable.  He does not endorse any other complaints.  Overall he feels well and looks well.   Review of Systems A 10  point review of systems was performed and it is as noted above otherwise negative.   Patient Active Problem List   Diagnosis Date Noted   Bronchial obstruction 12/23/2021   Excessive granulation tissue    S/P lobectomy of lung 04/07/2021   Metastatic cancer to lung (HCC) 08/11/2020   Essential hypertension 08/11/2020   Dyslipidemia 08/11/2020   Spasm of muscle of lower back 08/11/2020   Peyronie's disease 10/15/2019   Tobacco use 10/15/2019   Glaucoma of right eye 03/22/2018   Erectile dysfunction 03/22/2018   Decreased vision of right eye 09/21/2017   Colostomy in place [facility]) 09/21/2017   Hypogonadism in male 09/21/2017   Perennial allergic rhinitis with seasonal variation    Degeneration of lumbar intervertebral disc 06/26/2017   Rectal cancer (HCC) 08/05/2014    Social History   Tobacco Use   Smoking status: Former    Types: Cigars   Smokeless tobacco: Current    Types: Chew   Tobacco comments:    No Cigars. Does Dip. Khj 10/04/2022  Substance Use Topics   Alcohol use: Yes    Alcohol/week: 0.0 standard drinks of alcohol    Comment: RARE    Allergies  Allergen Reactions   Sulfa Antibiotics Hives   Morphine And Codeine Other (See Comments)    Altered mental status    Current Meds  Medication Sig   acetaminophen (TYLENOL) 500 MG tablet Take 1,000 mg by mouth every 6 (six) hours as needed for mild pain.   albuterol (VENTOLIN HFA) 108 (90 Base) MCG/ACT inhaler INHALE 2 PUFFS BY MOUTH INTO THE LUNGS EVERY 6 HOURS AS NEEDED FOR WHEEZING OR SHORTNESS OF BREATH   amLODipine (NORVASC) 10 MG tablet TAKE 1 TABLET BY MOUTH EVERY DAY   calcium carbonate (TUMS - DOSED IN MG ELEMENTAL CALCIUM) 500 MG chewable tablet Chew 2 tablets by mouth as needed for indigestion or heartburn.   cetirizine (ZYRTEC) 10 MG tablet Take 10 mg by mouth at bedtime.    cyclobenzaprine (FLEXERIL) 10 MG tablet TAKE 1 TABLET BY MOUTH THREE TIMES A DAY AS NEEDED FOR MUSCLE SPASM   fluticasone-salmeterol  (WIXELA INHUB) 250-50 MCG/ACT AEPB Inhale 1 puff into the lungs in the morning and at bedtime.   loperamide (IMODIUM) 2 MG capsule Take 4 mg by mouth as needed for diarrhea or loose stools.    Multiple Vitamin (MULTIVITAMIN WITH MINERALS) TABS tablet Take 1 tablet by mouth every evening.    tadalafil (CIALIS) 20 MG tablet TAKE 1 TAB BY MOUTH 1 HOUR PRIOR TO INTERCOURSE AS NEEDED    Immunization History  Administered Date(s) Administered   COVID-19, mRNA, vaccine(Comirnaty)12 years and older 02/21/2022   Fluad Quad(high Dose 65+) 10/12/2021   Influenza,inj,Quad PF,6+ Mos 03/06/2017, 09/21/2017, 11/28/2018, 10/15/2019   Influenza-Unspecified 11/17/2020   PFIZER(Purple Top)SARS-COV-2 Vaccination 03/10/2019, 04/09/2019   PNEUMOCOCCAL CONJUGATE-20 10/12/2021   Respiratory Syncytial Virus Vaccine,Recomb Aduvanted(Arexvy) 02/21/2022   Tdap 02/21/2016   Zoster Recombinant(Shingrix) 07/03/2016        Objective:     BP (!) 140/78 (BP  Location: Right Arm, Cuff Size: Large)   Pulse 93   Temp 98.3 F (36.8 C)   Ht 6\' 3"  (1.905 m)   Wt 274 lb 6.4 oz (124.5 kg)   SpO2 96%   BMI 34.30 kg/m   SpO2: 96 % O2 Device: None (Room air)  GENERAL: Well-developed, well-nourished gentleman no acute distress, fully ambulatory, no conversational dyspnea. HEAD: Normocephalic, atraumatic.  EYES: Pupils equal, round, reactive to light.  No scleral icterus.  MOUTH: Oral mucosa moist.  No thrush. NECK: Supple. No thyromegaly. Trachea midline. No JVD.  No adenopathy. PULMONARY: Good air entry bilaterally.  No adventitious sounds. CARDIOVASCULAR: S1 and S2. Regular rate and rhythm.  No rubs, murmurs or gallops heard.  Port-A-Cath on left. ABDOMEN: Benign. MUSCULOSKELETAL: No joint deformity, no clubbing, no edema.  NEUROLOGIC: No overt focal deficit, no gait disturbance, speech is fluent. SKIN: Intact,warm,dry. PSYCH: Mood and behavior normal.   Assessment & Plan:     ICD-10-CM   1. Bronchial  obstruction - LEFT  J98.09    Status post cryoablation and balloon dilation Clinically relieved No need for further intervention at present    2. Shortness of breath  R06.02    Improved after intervention Improved after switch of inhalers    3. Asthmatic bronchitis , chronic  J44.89    Continue Advair 250/50 1 inhalation twice a day    4. Malignant neoplasm metastatic to left lung (HCC)  C78.02    Status post left upper lobectomy No evidence of recurrence     We will see the patient in follow-up in 3 to 4 months time he is to contact us sooner should any new problems arise.    Gailen Shelter, MD Advanced Bronchoscopy PCCM [facility]    *This note was dictated using voice recognition software/Dragon.  Despite best efforts to proofread, errors can occur which can change the meaning. Any transcriptional errors that result from this process are unintentional and may not be fully corrected at the time of dictation.

## 2022-10-04 NOTE — Patient Instructions (Signed)
Continue using the Advair as you are doing.  Will see you in follow-up in 3 to 4 months time call sooner should any new problems arise.

## 2022-10-12 ENCOUNTER — Other Ambulatory Visit: Payer: Self-pay | Admitting: Family Medicine

## 2022-10-12 DIAGNOSIS — I10 Essential (primary) hypertension: Secondary | ICD-10-CM

## 2022-10-13 ENCOUNTER — Other Ambulatory Visit: Payer: Self-pay | Admitting: Family Medicine

## 2022-10-21 ENCOUNTER — Other Ambulatory Visit: Payer: Self-pay | Admitting: Nurse Practitioner

## 2022-10-21 DIAGNOSIS — J411 Mucopurulent chronic bronchitis: Secondary | ICD-10-CM

## 2022-10-21 DIAGNOSIS — J209 Acute bronchitis, unspecified: Secondary | ICD-10-CM

## 2022-10-29 ENCOUNTER — Encounter: Payer: Self-pay | Admitting: Pulmonary Disease

## 2022-10-30 MED ORDER — AZITHROMYCIN 250 MG PO TABS: ORAL_TABLET | ORAL | 0 refills | Status: DC

## 2022-10-30 MED ORDER — METHYLPREDNISOLONE 4 MG PO TBPK: ORAL_TABLET | ORAL | 0 refills | Status: DC

## 2022-10-30 NOTE — Telephone Encounter (Signed)
Lets do a Medrol Dosepak and Azithromycin Z-Pak.

## 2022-10-31 DIAGNOSIS — Z933 Colostomy status: Secondary | ICD-10-CM | POA: Diagnosis not present

## 2022-11-24 NOTE — Progress Notes (Unsigned)
Name: [name] [name]   MRN: [id number]    DOB: 1955/05/27   Date:11/27/2022       Progress Note  Subjective  Chief Complaint  Medication Refill  I connected with  [name] [name]  on 11/27/22 at  9:40 AM EST by a video enabled telemedicine application and verified that I am speaking with the correct person using two identifiers.  I discussed the limitations of evaluation and management by telemedicine and the availability of in person appointments. The patient expressed understanding and agreed to proceed with the virtual visit  Staff also discussed with the patient that there may be a patient responsible charge related to this service. Patient Location: home  Provider Location: [facility] Additional Individuals present: alone   HPI  HTN: he does not check his bp at home, his last BP in our office was normal, but elevated during recent visit with Dr. Marcos Eke. Advised him to ask to be checked twice when seen by other providers. .  He denies chest pain, palpitation   Hypogonadism: under the care of Dr. Lonna Cobb, last PSA was 03/2022 and stable at 4.35 .   Dyslipidemia/Aorta atherosclerosis: discussed CT chest done 06/2021 , he is not on statin therapy, he asked to have labs done through his port, future orders placed on his chart today   Rectal cancer stage IV: with mets to left lung and status post left lobectomy 2021 ,  last CEA was 2.4 - previous level was 0.9  He had a colectomy and has a colostomy bad since 2016 . He developed chemo induced neuropathy. Initially on both hands and feet. He tried gabapentin but it caused diarrhea. He states hand neuropathy has resolved and feet neuropathy is not severe.   Asthmatic  bronchitis: He is now under the care of Dr. Marcos Eke, his cough improved with [facility] , he has been compliant with medication  AR: doing well on otc medication  History of back surgery: had back surgery many years ago, he no longer has radiculitis , permanent numbness on 4th and 5  th toe left foot,  he takes flexeril prn for back spasms and needs a refill   Chemo induced neuropathy: he tried gabapentin but did not help and it caused diarrhea so he stopped, symptoms are stable, no problems on hands now just has it on both feet. Unchanged  Patient Active Problem List   Diagnosis Date Noted   Bronchial obstruction 12/23/2021   Excessive granulation tissue    S/P lobectomy of lung 04/07/2021   Metastatic cancer to lung (HCC) 08/11/2020   Essential hypertension 08/11/2020   Dyslipidemia 08/11/2020   Spasm of muscle of lower back 08/11/2020   Peyronie's disease 10/15/2019   Tobacco use 10/15/2019   Glaucoma of right eye 03/22/2018   Erectile dysfunction 03/22/2018   Decreased vision of right eye 09/21/2017   Colostomy in place [facility]) 09/21/2017   Hypogonadism in male 09/21/2017   Perennial allergic rhinitis with seasonal variation    Degeneration of lumbar intervertebral disc 06/26/2017   Rectal cancer (HCC) 08/05/2014    Past Surgical History:  Procedure Laterality Date   CHOLECYSTECTOMY     COLON SURGERY     ENDOBRONCHIAL ULTRASOUND N/A 07/01/2018   Procedure: ENDOBRONCHIAL ULTRASOUND;  Surgeon: Salena Saner, MD;  Location: [facility] ORS;  Service: Cardiopulmonary;  Laterality: N/A;   FLEXIBLE BRONCHOSCOPY Left 11/20/2018   Procedure: FLEXIBLE BRONCHOSCOPY;  Surgeon: Salena Saner, MD;  Location: [facility] ORS;  Service: Cardiopulmonary;  Laterality: Left;  FLEXIBLE BRONCHOSCOPY Left 12/23/2021   Procedure: FLEXIBLE BRONCHOSCOPY;  Surgeon: Salena Saner, MD;  Location: [facility] ORS;  Service: Pulmonary;  Laterality: Left;   FLEXIBLE BRONCHOSCOPY Left 08/09/2022   Procedure: FLEXIBLE BRONCHOSCOPY;  Surgeon: Salena Saner, MD;  Location: [facility] ORS;  Service: Pulmonary;  Laterality: Left;   INTERCOSTAL NERVE BLOCK Left 02/03/2019   Procedure: INTERCOSTAL NERVE BLOCK WITH EXPAREL;  Surgeon: Delight Ovens, MD;  Location: [facility] OR;  Service: Thoracic;   Laterality: Left;   KNEE ARTHROSCOPY Right    KNEE SURGERY Left    torn ligaments (staple in place)   laceration of wrist Right    LOBECTOMY Left 02/03/2019   Procedure: LEFT UPPER LOBECTOMY WITH LYMPH NODE DISSECTION;  Surgeon: Delight Ovens, MD;  Location: [facility] OR;  Service: Thoracic;  Laterality: Left;   LUMBAR DISC SURGERY     ruptured disc   PORT A CATH REVISION Left 08/05/2018   Procedure: PORT A CATH REVISION, REPOSITION LEFT;  Surgeon: Earline Mayotte, MD;  Location: [facility] ORS;  Service: General;  Laterality: Left;   PORT-A-CATH REMOVAL Right 12/02/2014   Procedure: REMOVAL PORT-A-CATH;  Surgeon: Ida Rogue, MD;  Location: [facility] ORS;  Service: General;  Laterality: Right;   PORTACATH PLACEMENT     PORTACATH PLACEMENT Left 07/29/2018   Procedure: INSERTION PORT-A-CATH LEFT;  Surgeon: Earline Mayotte, MD;  Location: [facility] ORS;  Service: General;  Laterality: Left;   RECTAL BIOPSY  04/30/2013   RIGID BRONCHOSCOPY Left 08/09/2022   Procedure: RIGID BRONCHOSCOPY;  Surgeon: Salena Saner, MD;  Location: [facility] ORS;  Service: Pulmonary;  Laterality: Left;   THORACOTOMY/LOBECTOMY Left 02/03/2019   Procedure: MINITHORACOTOMY;  Surgeon: Delight Ovens, MD;  Location: [facility] OR;  Service: Thoracic;  Laterality: Left;   VIDEO ASSISTED THORACOSCOPY (VATS)/WEDGE RESECTION Left 02/03/2019   Procedure: VIDEO ASSISTED THORACOSCOPY (VATS)/LUNG RESECTION;  Surgeon: Delight Ovens, MD;  Location[facility] OR;  Service: Thoracic;  Laterality: Left;   VIDEO BRONCHOSCOPY N/A 02/03/2019   Procedure: VIDEO BRONCHOSCOPY;  Surgeon: Delight Ovens, MD;  Location: [facility] OR;  Service: Thoracic;  Laterality: N/A;   VIDEO BRONCHOSCOPY N/A 10/28/2021   Procedure: VIDEO BRONCHOSCOPY WITH CRYO and DEBULKING;  Surgeon: Salena Saner, MD;  Location: [facility] ORS;  Service: Cardiopulmonary;  Laterality: N/A;   VIDEO BRONCHOSCOPY WITH ENDOBRONCHIAL ULTRASOUND N/A 12/30/2018   Procedure: VIDEO  BRONCHOSCOPY WITH ENDOBRONCHIAL ULTRASOUND;  Surgeon: Delight Ovens, MD;  Location: [facility] OR;  Service: Thoracic;  Laterality: N/A;   XI ROBOT ABDOMINAL PERINEAL RESECTION  10/09/2013    Family History  Problem Relation Age of Onset   Cancer Mother    Hypertension Mother    COPD Mother    COPD Father    Cancer Father        Prostate CA, on Lupron   Stroke Maternal Grandmother    Cancer Paternal Grandmother    Heart attack Paternal Grandfather    Cancer Paternal Grandfather     Social History   Socioeconomic History   Marital status: Married    Spouse name: Rubin Payor   Number of children: 1   Years of education: Not on file   Highest education level: Not on file  Occupational History   Not on file  Tobacco Use   Smoking status: Former    Types: Cigars   Smokeless tobacco: Current    Types: Chew   Tobacco comments:    No Cigars. Does Dip. Khj 10/04/2022  Vaping Use   Vaping  status: Never Used  Substance and Sexual Activity   Alcohol use: Yes    Alcohol/week: 0.0 standard drinks of alcohol    Comment: RARE   Drug use: No   Sexual activity: Yes    Partners: Female  Other Topics Concern   Not on file  Social History Narrative   Not on file   Social Determinants of Health   Financial Resource Strain: Low Risk  (08/17/2022)   Overall Financial Resource Strain (CARDIA)    Difficulty of Paying Living Expenses: Not hard at all  Food Insecurity: No Food Insecurity (08/17/2022)   Hunger Vital Sign    Worried About Running Out of Food in the Last Year: Never true    Ran Out of Food in the Last Year: Never true  Transportation Needs: No Transportation Needs (08/17/2022)   PRAPARE - Administrator, Civil Service (Medical): No    Lack of Transportation (Non-Medical): No  Physical Activity: Inactive (08/17/2022)   Exercise Vital Sign    Days of Exercise per Week: 0 days    Minutes of Exercise per Session: 0 min  Stress: No Stress Concern Present (08/17/2022)    [employer] of Occupational Health - Occupational Stress Questionnaire    Feeling of Stress : Not at all  Social Connections: Socially Integrated (08/17/2022)   Social Connection and Isolation Panel [NHANES]    Frequency of Communication with Friends and Family: More than three times a week    Frequency of Social Gatherings with Friends and Family: More than three times a week    Attends Religious Services: More than 4 times per year    Active Member of [employer] or Organizations: Yes    Attends Banker Meetings: More than 4 times per year    Marital Status: Married  Catering manager Violence: Not At Risk (08/17/2022)   Humiliation, Afraid, Rape, and Kick questionnaire    Fear of Current or Ex-Partner: No    Emotionally Abused: No    Physically Abused: No    Sexually Abused: No     Current Outpatient Medications:    acetaminophen (TYLENOL) 500 MG tablet, Take 1,000 mg by mouth every 6 (six) hours as needed for mild pain., Disp: , Rfl:    albuterol (VENTOLIN HFA) 108 (90 Base) MCG/ACT inhaler, INHALE 2 PUFFS BY MOUTH INTO THE LUNGS EVERY 6 HOURS AS NEEDED FOR WHEEZING OR SHORTNESS OF BREATH, Disp: 18 each, Rfl: 1   amLODipine (NORVASC) 10 MG tablet, TAKE 1 TABLET BY MOUTH EVERY DAY, Disp: 90 tablet, Rfl: 0   calcium carbonate (TUMS - DOSED IN MG ELEMENTAL CALCIUM) 500 MG chewable tablet, Chew 2 tablets by mouth as needed for indigestion or heartburn., Disp: , Rfl:    cetirizine (ZYRTEC) 10 MG tablet, Take 10 mg by mouth at bedtime. , Disp: , Rfl:    cyclobenzaprine (FLEXERIL) 10 MG tablet, TAKE 1 TABLET BY MOUTH THREE TIMES A DAY AS NEEDED FOR MUSCLE SPASM, Disp: 90 tablet, Rfl: 0   fluticasone-salmeterol (WIXELA INHUB) 250-50 MCG/ACT AEPB, Inhale 1 puff into the lungs in the morning and at bedtime., Disp: 60 each, Rfl: 5   loperamide (IMODIUM) 2 MG capsule, Take 4 mg by mouth as needed for diarrhea or loose stools. , Disp: , Rfl:    Multiple Vitamin (MULTIVITAMIN WITH  MINERALS) TABS tablet, Take 1 tablet by mouth every evening. , Disp: , Rfl:    tadalafil (CIALIS) 20 MG tablet, TAKE 1 TAB BY MOUTH 1 HOUR PRIOR  TO INTERCOURSE AS NEEDED, Disp: 30 tablet, Rfl: 3 No current facility-administered medications for this visit.  Facility-Administered Medications Ordered in Other Visits:    heparin lock flush 100 unit/mL, 500 Units, Intravenous, Once, Finnegan, Tollie Pizza, MD   sodium chloride flush (NS) 0.9 % injection 10 mL, 10 mL, Intravenous, Once, Orlie Dakin, Tollie Pizza, MD  Allergies  Allergen Reactions   Sulfa Antibiotics Hives   Morphine And Codeine Other (See Comments)    Altered mental status    I personally reviewed active problem list, medication list, allergies, family history, social history, notes from last encounter with the patient/caregiver today.   ROS  Ten systems reviewed and is negative except as mentioned in HPI    Objective  Virtual encounter, vitals not obtained.  There is no height or weight on file to calculate BMI.  Physical Exam  Awake, alert and oriented   PHQ2/9:    11/27/2022    8:23 AM 09/27/2022    8:08 AM 08/24/2022   10:43 AM 08/17/2022   10:36 AM 03/17/2022    2:23 PM  Depression screen PHQ 2/9  Decreased Interest 0 0 0 0 0  Down, Depressed, Hopeless 0 0 0 0 0  PHQ - 2 Score 0 0 0 0 0  Altered sleeping 0 0 0  0  Tired, decreased energy 0 0 0  0  Change in appetite 0 0 0  0  Feeling bad or failure about yourself  0 0 0  0  Trouble concentrating 0 0 0  0  Moving slowly or fidgety/restless 0 0 0  0  Suicidal thoughts 0 0 0  0  PHQ-9 Score 0 0 0  0  Difficult doing work/chores     Not difficult at all   PHQ-2/9 Result is negative.    Fall Risk:    11/27/2022    8:23 AM 09/27/2022    8:08 AM 08/24/2022   10:43 AM 08/17/2022   10:37 AM 06/05/2022   12:50 PM  Fall Risk   Falls in the past year? 1 1 1 1  0  Number falls in past yr: 0 0 0 0   Injury with Fall? 0 0 0 0 0  Risk for fall due to : No Fall Risks  History of fall(s) History of fall(s) No Fall Risks   Follow up Falls prevention discussed Falls prevention discussed;Education provided Falls evaluation completed;Education provided;Falls prevention discussed Falls prevention discussed      Assessment & Plan   1. Chemotherapy-induced neuropathy (HCC)  No longer on hands but still has symptoms on both feet, Gabapentin and Lyrica caused diarrhea  2. Colostomy in place (HCC)  unchanged  3. Asthmatic bronchitis , chronic (HCC)  Seeing Dr. Marcos Eke, he states symptoms have improved with Wyxella  4. Atherosclerosis of aorta (HCC)  - Lipid panel; Future Not on statin therapy, he asked to send orders electronically so it can be collected from port   5. Rectal cancer metastasized to lung [facility])  Under the care of Dr. Orlie Dakin, advised to call back , base on his notes he was supposed to have a visit in Sep  6. Essential hypertension  - amLODipine (NORVASC) 10 MG tablet; Take 1 tablet (10 mg total) by mouth daily.  Dispense: 90 tablet; Refill: 3 - Comprehensive metabolic panel; Future  7. History of lobectomy of lung  For treatment with metastatic rectal cancer, reviewed recent CT   8. Perennial allergic rhinitis with seasonal variation  Taking otc medications  9.  Dyslipidemia  - Lipid panel; Future  10. Spasm of muscle of lower back  - cyclobenzaprine (FLEXERIL) 10 MG tablet; TAKE 1 TABLET BY MOUTH THREE TIMES A DAY AS NEEDED FOR MUSCLE SPASM  Dispense: 90 tablet; Refill: 3   I discussed the assessment and treatment plan with the patient. The patient was provided an opportunity to ask questions and all were answered. The patient agreed with the plan and demonstrated an understanding of the instructions.  The patient was advised to call back or seek an in-person evaluation if the symptoms worsen or if the condition fails to improve as anticipated.  I provided 25 minutes of non-face-to-face time during this encounter.

## 2022-11-27 ENCOUNTER — Encounter: Payer: Self-pay | Admitting: Family Medicine

## 2022-11-27 ENCOUNTER — Telehealth: Payer: PPO | Admitting: Family Medicine

## 2022-11-27 DIAGNOSIS — G620 Drug-induced polyneuropathy: Secondary | ICD-10-CM | POA: Diagnosis not present

## 2022-11-27 DIAGNOSIS — Z87891 Personal history of nicotine dependence: Secondary | ICD-10-CM | POA: Diagnosis not present

## 2022-11-27 DIAGNOSIS — Z902 Acquired absence of lung [part of]: Secondary | ICD-10-CM

## 2022-11-27 DIAGNOSIS — I10 Essential (primary) hypertension: Secondary | ICD-10-CM

## 2022-11-27 DIAGNOSIS — C7800 Secondary malignant neoplasm of unspecified lung: Secondary | ICD-10-CM

## 2022-11-27 DIAGNOSIS — Z933 Colostomy status: Secondary | ICD-10-CM

## 2022-11-27 DIAGNOSIS — E785 Hyperlipidemia, unspecified: Secondary | ICD-10-CM

## 2022-11-27 DIAGNOSIS — I700 Atherosclerosis of aorta: Secondary | ICD-10-CM

## 2022-11-27 DIAGNOSIS — M62830 Muscle spasm of back: Secondary | ICD-10-CM | POA: Diagnosis not present

## 2022-11-27 DIAGNOSIS — C20 Malignant neoplasm of rectum: Secondary | ICD-10-CM | POA: Diagnosis not present

## 2022-11-27 DIAGNOSIS — J4489 Other specified chronic obstructive pulmonary disease: Secondary | ICD-10-CM

## 2022-11-27 DIAGNOSIS — J3089 Other allergic rhinitis: Secondary | ICD-10-CM | POA: Diagnosis not present

## 2022-11-27 MED ORDER — CYCLOBENZAPRINE HCL 10 MG PO TABS
ORAL_TABLET | ORAL | 3 refills | Status: DC
Start: 2022-11-27 — End: 2023-10-02

## 2022-11-27 MED ORDER — AMLODIPINE BESYLATE 10 MG PO TABS
10.0000 mg | ORAL_TABLET | Freq: Every day | ORAL | 3 refills | Status: DC
Start: 2022-11-27 — End: 2023-10-02

## 2022-11-29 DIAGNOSIS — Z933 Colostomy status: Secondary | ICD-10-CM | POA: Diagnosis not present

## 2022-12-12 ENCOUNTER — Other Ambulatory Visit: Payer: Self-pay | Admitting: Nurse Practitioner

## 2022-12-12 DIAGNOSIS — J209 Acute bronchitis, unspecified: Secondary | ICD-10-CM

## 2022-12-12 DIAGNOSIS — J411 Mucopurulent chronic bronchitis: Secondary | ICD-10-CM

## 2022-12-15 DIAGNOSIS — H01021 Squamous blepharitis right upper eyelid: Secondary | ICD-10-CM | POA: Diagnosis not present

## 2022-12-15 DIAGNOSIS — H40021 Open angle with borderline findings, high risk, right eye: Secondary | ICD-10-CM | POA: Diagnosis not present

## 2022-12-15 DIAGNOSIS — H01024 Squamous blepharitis left upper eyelid: Secondary | ICD-10-CM | POA: Diagnosis not present

## 2022-12-15 DIAGNOSIS — B880 Other acariasis: Secondary | ICD-10-CM | POA: Diagnosis not present

## 2023-01-05 ENCOUNTER — Encounter: Payer: Self-pay | Admitting: Pulmonary Disease

## 2023-01-05 DIAGNOSIS — Z933 Colostomy status: Secondary | ICD-10-CM | POA: Diagnosis not present

## 2023-01-12 ENCOUNTER — Ambulatory Visit: Payer: PPO | Admitting: Pulmonary Disease

## 2023-01-19 ENCOUNTER — Other Ambulatory Visit: Payer: Self-pay | Admitting: *Deleted

## 2023-01-19 ENCOUNTER — Encounter: Payer: Self-pay | Admitting: Oncology

## 2023-01-19 DIAGNOSIS — C20 Malignant neoplasm of rectum: Secondary | ICD-10-CM

## 2023-01-24 DIAGNOSIS — J4489 Other specified chronic obstructive pulmonary disease: Secondary | ICD-10-CM

## 2023-01-24 HISTORY — DX: Other specified chronic obstructive pulmonary disease: J44.89

## 2023-01-31 DIAGNOSIS — Z933 Colostomy status: Secondary | ICD-10-CM | POA: Diagnosis not present

## 2023-02-09 DIAGNOSIS — H40021 Open angle with borderline findings, high risk, right eye: Secondary | ICD-10-CM | POA: Diagnosis not present

## 2023-02-16 ENCOUNTER — Encounter: Payer: Self-pay | Admitting: Pulmonary Disease

## 2023-02-16 ENCOUNTER — Ambulatory Visit: Payer: PPO | Admitting: Pulmonary Disease

## 2023-02-16 VITALS — BP 138/80 | HR 91 | Temp 97.1°F | Ht 75.0 in | Wt 281.4 lb

## 2023-02-16 DIAGNOSIS — R0602 Shortness of breath: Secondary | ICD-10-CM | POA: Diagnosis not present

## 2023-02-16 DIAGNOSIS — C20 Malignant neoplasm of rectum: Secondary | ICD-10-CM | POA: Diagnosis not present

## 2023-02-16 DIAGNOSIS — J9809 Other diseases of bronchus, not elsewhere classified: Secondary | ICD-10-CM | POA: Diagnosis not present

## 2023-02-16 DIAGNOSIS — J4489 Other specified chronic obstructive pulmonary disease: Secondary | ICD-10-CM | POA: Diagnosis not present

## 2023-02-16 LAB — NITRIC OXIDE: Nitric Oxide: 20

## 2023-02-16 NOTE — Patient Instructions (Signed)
VISIT SUMMARY:  During today's visit, we discussed your respiratory health, particularly focusing on your bronchial obstruction due to granulation tissue and asthmatic bronchitis. You reported stable breathing with occasional mild coughing, and you are currently managing well with Advair. We also planned for upcoming tests to monitor your condition.  YOUR PLAN:  -BRONCHIAL OBSTRUCTION DUE TO GRANULATION TISSUE: Bronchial obstruction due to granulation tissue is a blockage in the airways caused by scar tissue following surgery. Your breathing is stable, and you are managing well with Advair. We have scheduled a chest interval scan and spirometry on March 17th to monitor your condition and compare with pre-surgery values. This will help [place] determine if any further intervention is needed.  -ASTHMATIC BRONCHITIS: Asthmatic bronchitis is inflammation of the airways that causes coughing and difficulty breathing. Your condition is well-managed with Advair, and you only experience occasional coughing. We will check your airway inflammation levels to establish a baseline and monitor for potential flare-ups.  INSTRUCTIONS:  Please ensure you complete the chest interval scan and spirometry on March 17th. Schedule a follow-up appointment after these tests to discuss the results and any further steps needed.

## 2023-02-16 NOTE — Progress Notes (Signed)
Subjective:    Patient ID: [name] [name], male    DOB: 1955-07-28, 68 y.o.   MRN: [id number]  Patient Care Team: Alba Cory, MD as PCP - General (Family Medicine) Jeralyn Ruths, MD as Consulting Physician (Oncology) Riki Altes, MD (Urology) [place] Crane, MD as Consulting Physician (Ophthalmology) Alba Cory, MD as Attending Physician (Family Medicine) Salena Saner, MD as Consulting Physician (Pulmonary Disease) Cardiovascular, [name]  Chief Complaint  Patient presents with   Follow-up    DOE. No wheezing or cough.    BACKGROUND/INTERVAL: [name] [name] is a 68 year old lifelong never smoker (does chew tobacco) who follows on the issue of asthmatic bronchitis and prior left bronchial obstruction due to granulation tissue on the bronchial stump from prior left upper lobectomy for lung metastatic deposit from stage IV rectal adenocarcinoma.  This is a scheduled visit.  Patient underwent balloon dilation and cryotherapy to the granulation tissue/bronchial stricture on 10/2021, 12/2021 and 07/2022.  Patient was last seen on 04 October 2022, no issues in the interim.  HPI Discussed the use of AI scribe software for clinical note transcription with the patient, who gave verbal consent to proceed.  History of Present Illness   The patient, with a history of bronchial obstruction due to granulation tissue following resection of a lone metastatic deposit of rectal adenocarcinoma on the left upper lobe, reports stable respiratory symptoms. He describes his breathing as "good" and denies any worsening of symptoms. He also mentions occasional coughing, but it is not a significant concern. The patient is currently on Advair, which he reports as effective in managing his symptoms. He has not experienced any significant flare-ups of asthmatic bronchitis recently. 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 He is scheduled for a surveillance chest, abdomen and pelvis CT in March 2025.    Overall he feels well and looks well.  Review of Systems A 10 point review of systems was performed and it is as noted above otherwise negative.   Patient Active Problem List   Diagnosis Date Noted   Chemotherapy-induced neuropathy (HCC) 11/27/2022   Asthmatic bronchitis , chronic (HCC) 11/27/2022   Bronchial obstruction 12/23/2021   Excessive granulation tissue    History of lobectomy of lung 04/07/2021   Metastatic cancer to lung (HCC) 08/11/2020   Essential hypertension 08/11/2020   Dyslipidemia 08/11/2020   Spasm of muscle of lower back 08/11/2020   Peyronie's disease 10/15/2019   Tobacco use 10/15/2019   Glaucoma of right eye 03/22/2018   Erectile dysfunction 03/22/2018   Decreased vision of right eye 09/21/2017   Colostomy in place [facility]) 09/21/2017   Hypogonadism in male 09/21/2017   Perennial allergic rhinitis with seasonal variation    Degeneration of lumbar intervertebral disc 06/26/2017   Rectal cancer (HCC) 08/05/2014    Social History   Tobacco Use   Smoking status: Former    Types: Cigars   Smokeless tobacco: Current    Types: Chew   Tobacco comments:    No Cigars. Does Dip. Khj 02/16/2023  Substance Use Topics   Alcohol use: Yes    Alcohol/week: 0.0 standard drinks of alcohol    Comment: RARE    Allergies  Allergen Reactions   Sulfa Antibiotics Hives   Morphine And Codeine Other (See Comments)    Altered mental status    Current Meds  Medication Sig   acetaminophen (TYLENOL) 500 MG tablet Take 1,000 mg by mouth  every 6 (six) hours as needed for mild pain.   albuterol (VENTOLIN HFA) 108 (90 Base) MCG/ACT inhaler INHALE 2 PUFFS BY MOUTH INTO THE LUNGS EVERY 6 HOURS AS NEEDED FOR WHEEZING OR SHORTNESS OF BREATH   amLODipine (NORVASC) 10 MG tablet Take 1 tablet (10 mg total) by mouth daily.   calcium carbonate (TUMS - DOSED IN MG ELEMENTAL CALCIUM) 500 MG  chewable tablet Chew 2 tablets by mouth as needed for indigestion or heartburn.   cetirizine (ZYRTEC) 10 MG tablet Take 10 mg by mouth at bedtime.    cyclobenzaprine (FLEXERIL) 10 MG tablet TAKE 1 TABLET BY MOUTH THREE TIMES A DAY AS NEEDED FOR MUSCLE SPASM   fluticasone-salmeterol (WIXELA INHUB) 250-50 MCG/ACT AEPB Inhale 1 puff into the lungs in the morning and at bedtime.   loperamide (IMODIUM) 2 MG capsule Take 4 mg by mouth as needed for diarrhea or loose stools.    Multiple Vitamin (MULTIVITAMIN WITH MINERALS) TABS tablet Take 1 tablet by mouth every evening.     Immunization History  Administered Date(s) Administered   Fluad Quad(high Dose 65+) 10/12/2021   Influenza, High Dose Seasonal PF 11/23/2022   Influenza,inj,Quad PF,6+ Mos 03/06/2017, 09/21/2017, 11/28/2018, 10/15/2019   Influenza-Unspecified 11/17/2020   PFIZER(Purple Top)SARS-COV-2 Vaccination 03/10/2019, 04/09/2019   PNEUMOCOCCAL CONJUGATE-20 10/12/2021   Pfizer(Comirnaty)Fall Seasonal Vaccine 12 years and older 11/23/2022   Respiratory Syncytial Virus Vaccine,Recomb Aduvanted(Arexvy) 02/21/2022   Tdap 02/21/2016   Zoster Recombinant(Shingrix) 07/03/2016, 11/23/2022        Objective:   BP 138/80 (BP Location: Right Arm, Cuff Size: Large)   Pulse 91   Temp (!) 97.1 F (36.2 C)   Ht 6\' 3"  (1.905 m)   Wt 281 lb 6.4 oz (127.6 kg)   SpO2 100%   BMI 35.17 kg/m   SpO2: 100 % O2 Device: None (Room air)  GENERAL: Well-developed, well-nourished gentleman no acute distress, fully ambulatory, no conversational dyspnea. HEAD: Normocephalic, atraumatic.  EYES: Pupils equal, round, reactive to light.  No scleral icterus.  MOUTH: Oral mucosa moist.  No thrush. NECK: Supple. No thyromegaly. Trachea midline. No JVD.  No adenopathy. PULMONARY: Good air entry bilaterally.  Coarse on left otherwise, no adventitious sounds. CARDIOVASCULAR: S1 and S2. Regular rate and rhythm.  No rubs, murmurs or gallops heard.  Port-A-Cath on  left. ABDOMEN: Benign. MUSCULOSKELETAL: No joint deformity, no clubbing, no edema.  NEUROLOGIC: No overt focal deficit, no gait disturbance, speech is fluent. SKIN: Intact,warm,dry. PSYCH: Mood and behavior normal.:  Lab Results  Component Value Date   NITRICOXIDE 20 02/16/2023    Assessment & Plan:     ICD-10-CM   1. Asthmatic bronchitis , chronic (HCC)  J44.89 Pulmonary Function Test [facility] Only    2. Shortness of breath  R06.02 Nitric oxide    Pulmonary Function Test [facility] Only    3. Bronchial obstruction - LEFT  J98.09     4. Rectal cancer (HCC)  C20       Orders Placed This Encounter  Procedures   Nitric oxide   Pulmonary Function Test [facility] Only    Standing Status:   Future    Expected Date:   04/09/2023    Expiration Date:   02/16/2024    Full PFT: includes the following: basic spirometry, spirometry pre & post bronchodilator, diffusion capacity (DLCO), lung volumes:   Full PFT    This test can only be performed at:   [facility]   Discussion:    Bronchial Obstruction due to Granulation Tissue  Bronchial obstruction secondary to granulation tissue post-resection of a metastatic deposit in the left upper lobe. Reports well-managed breathing with occasional mild dyspnea, no worsening symptoms. Physical exam reveals slight turbulence, improved from previous visits. Currently on Advair, which is effective. Chest interval scan scheduled for March 17th to monitor the condition. Discussed the importance of comparing spirometry results to pre-surgery values to assess progress and potential need for further intervention. - Order spirometry to compare with pre-surgery results - Coordinate spirometry with the chest interval scan on March 17th - Schedule follow-up appointment post-scan to assess the need for further intervention  Asthmatic Bronchitis Asthmatic bronchitis, well-managed with Advair, with only occasional coughing. Plan to establish a baseline for airway  inflammation to monitor potential flare-ups. - Nitric oxide levels today at 20 ppb  Follow-up - Schedule follow-up appointment after the surveillance scan and PFTs in March.    Advised if symptoms do not improve or worsen, to please contact office for sooner follow up or seek emergency care.    I spent 30 minutes of dedicated to the care of this patient on the date of this encounter to include pre-visit review of records, face-to-face time with the patient discussing conditions above, post visit ordering of testing, clinical documentation with the electronic health record, making appropriate referrals as documented, and communicating necessary findings to members of the patients care team.     C. Danice Goltz, MD Advanced Bronchoscopy PCCM [facility]    *This note was generated using voice recognition software/Dragon and/or AI transcription program.  Despite best efforts to proofread, errors can occur which can change the meaning. Any transcriptional errors that result from this process are unintentional and may not be fully corrected at the time of dictation.

## 2023-02-21 ENCOUNTER — Encounter: Payer: Self-pay | Admitting: Oncology

## 2023-02-22 ENCOUNTER — Other Ambulatory Visit: Payer: Self-pay | Admitting: Pulmonary Disease

## 2023-02-23 DIAGNOSIS — H40021 Open angle with borderline findings, high risk, right eye: Secondary | ICD-10-CM | POA: Diagnosis not present

## 2023-03-14 DIAGNOSIS — Z933 Colostomy status: Secondary | ICD-10-CM | POA: Diagnosis not present

## 2023-03-25 ENCOUNTER — Encounter: Payer: Self-pay | Admitting: Pulmonary Disease

## 2023-03-26 MED ORDER — AZITHROMYCIN 500 MG PO TABS: 500.0000 mg | ORAL_TABLET | Freq: Every day | ORAL | 0 refills | Status: AC

## 2023-03-26 MED ORDER — PREDNISONE 20 MG PO TABS: 20.0000 mg | ORAL_TABLET | Freq: Every day | ORAL | 0 refills | Status: AC

## 2023-03-26 NOTE — Telephone Encounter (Signed)
 Sent prescriptions to his pharmacy.  Sent azithromycin 500 mg daily for 3 days and prednisone 20 mg daily for 5 days.  Hope he feels better.  Call back if symptoms not better.

## 2023-04-09 ENCOUNTER — Other Ambulatory Visit: Payer: PPO

## 2023-04-09 ENCOUNTER — Other Ambulatory Visit: Payer: Self-pay | Admitting: *Deleted

## 2023-04-09 ENCOUNTER — Ambulatory Visit
Admission: RE | Admit: 2023-04-09 | Discharge: 2023-04-09 | Disposition: A | Discharge status: Home / Self Care | Payer: PPO | Source: Ambulatory Visit | Attending: Oncology | Admitting: Oncology

## 2023-04-09 ENCOUNTER — Inpatient Hospital Stay: Payer: PPO | Attending: Oncology

## 2023-04-09 ENCOUNTER — Other Ambulatory Visit: Payer: Self-pay

## 2023-04-09 DIAGNOSIS — Z85048 Personal history of other malignant neoplasm of rectum, rectosigmoid junction, and anus: Secondary | ICD-10-CM | POA: Diagnosis not present

## 2023-04-09 DIAGNOSIS — D649 Anemia, unspecified: Secondary | ICD-10-CM | POA: Insufficient documentation

## 2023-04-09 DIAGNOSIS — C20 Malignant neoplasm of rectum: Secondary | ICD-10-CM

## 2023-04-09 DIAGNOSIS — I700 Atherosclerosis of aorta: Secondary | ICD-10-CM | POA: Diagnosis not present

## 2023-04-09 DIAGNOSIS — R918 Other nonspecific abnormal finding of lung field: Secondary | ICD-10-CM | POA: Diagnosis not present

## 2023-04-09 LAB — CBC WITH DIFFERENTIAL/PLATELET
Abs Immature Granulocytes: 0.02 10*3/uL (ref 0.00–0.07)
Basophils Absolute: 0.1 10*3/uL (ref 0.0–0.1)
Basophils Relative: 2 %
Eosinophils Absolute: 0.2 10*3/uL (ref 0.0–0.5)
Eosinophils Relative: 5 %
HCT: 35 % — ABNORMAL LOW (ref 39.0–52.0)
Hemoglobin: 11.2 g/dL — ABNORMAL LOW (ref 13.0–17.0)
Immature Granulocytes: 1 %
Lymphocytes Relative: 23 %
Lymphs Abs: 1 10*3/uL (ref 0.7–4.0)
MCH: 27.1 pg (ref 26.0–34.0)
MCHC: 32 g/dL (ref 30.0–36.0)
MCV: 84.5 fL (ref 80.0–100.0)
Monocytes Absolute: 0.5 10*3/uL (ref 0.1–1.0)
Monocytes Relative: 11 %
Neutro Abs: 2.5 10*3/uL (ref 1.7–7.7)
Neutrophils Relative %: 58 %
Platelets: 427 10*3/uL — ABNORMAL HIGH (ref 150–400)
RBC: 4.14 MIL/uL — ABNORMAL LOW (ref 4.22–5.81)
RDW: 14.8 % (ref 11.5–15.5)
WBC: 4.2 10*3/uL (ref 4.0–10.5)
nRBC: 0 % (ref 0.0–0.2)

## 2023-04-09 LAB — IRON AND TIBC
Iron: 25 ug/dL — ABNORMAL LOW (ref 45–182)
Saturation Ratios: 6 % — ABNORMAL LOW (ref 17.9–39.5)
TIBC: 403 ug/dL (ref 250–450)
UIBC: 378 ug/dL

## 2023-04-09 LAB — CMP (CANCER CENTER ONLY)
ALT: 21 U/L (ref 0–44)
AST: 21 U/L (ref 15–41)
Albumin: 3.8 g/dL (ref 3.5–5.0)
Alkaline Phosphatase: 67 U/L (ref 38–126)
Anion gap: 7 (ref 5–15)
BUN: 18 mg/dL (ref 8–23)
CO2: 23 mmol/L (ref 22–32)
Calcium: 8.3 mg/dL — ABNORMAL LOW (ref 8.9–10.3)
Chloride: 108 mmol/L (ref 98–111)
Creatinine: 1.03 mg/dL (ref 0.61–1.24)
GFR, Estimated: >60 mL/min (ref >60)
Glucose, Bld: 114 mg/dL — ABNORMAL HIGH (ref 70–99)
Potassium: 3.6 mmol/L (ref 3.5–5.1)
Sodium: 138 mmol/L (ref 135–145)
Total Bilirubin: 0.5 mg/dL (ref 0.0–1.2)
Total Protein: 6.6 g/dL (ref 6.5–8.1)

## 2023-04-09 LAB — FERRITIN: Ferritin: 11 ng/mL — ABNORMAL LOW (ref 24–336)

## 2023-04-09 MED ORDER — IOHEXOL 300 MG/ML  SOLN
100.0000 mL | Freq: Once | INTRAMUSCULAR | Status: AC | PRN
  Administered 2023-04-09: 100 mL via INTRAVENOUS

## 2023-04-09 MED ORDER — BARIUM SULFATE 2 % PO SUSP
450.0000 mL | ORAL | Status: AC
  Administered 2023-04-09 (×2): 450 mL via ORAL

## 2023-04-09 MED ORDER — HEPARIN SOD (PORK) LOCK FLUSH 100 UNIT/ML IV SOLN
500.0000 [IU] | Freq: Once | INTRAVENOUS | Status: AC
  Administered 2023-04-09: 500 [IU] via INTRAVENOUS

## 2023-04-10 LAB — CEA: CEA: 1.6 ng/mL (ref 0.0–4.7)

## 2023-04-16 ENCOUNTER — Ambulatory Visit: Payer: PPO | Admitting: Oncology

## 2023-04-18 ENCOUNTER — Ambulatory Visit: Payer: PPO | Admitting: Pulmonary Disease

## 2023-04-18 ENCOUNTER — Telehealth: Payer: Self-pay

## 2023-04-18 ENCOUNTER — Encounter: Payer: Self-pay | Admitting: Pulmonary Disease

## 2023-04-18 VITALS — BP 136/84 | HR 84 | Temp 97.8°F | Ht 75.0 in | Wt 272.8 lb

## 2023-04-18 DIAGNOSIS — J4489 Other specified chronic obstructive pulmonary disease: Secondary | ICD-10-CM | POA: Diagnosis not present

## 2023-04-18 DIAGNOSIS — R0602 Shortness of breath: Secondary | ICD-10-CM

## 2023-04-18 DIAGNOSIS — C20 Malignant neoplasm of rectum: Secondary | ICD-10-CM

## 2023-04-18 DIAGNOSIS — J9809 Other diseases of bronchus, not elsewhere classified: Secondary | ICD-10-CM | POA: Diagnosis not present

## 2023-04-18 NOTE — Progress Notes (Unsigned)
 Subjective:    Patient ID: [name] [name], male    DOB: 31-Oct-1955, 68 y.o.   MRN: [id number]  Patient Care Team: Alba Cory, MD as PCP - General (Family Medicine) Jeralyn Ruths, MD as Consulting Physician (Oncology) Riki Altes, MD (Urology) [place] Crane, MD as Consulting Physician (Ophthalmology) Alba Cory, MD as Attending Physician (Family Medicine) Salena Saner, MD as Consulting Physician (Pulmonary Disease) Cardiovascular, [facility]  Chief Complaint  Patient presents with  . Follow-up    Cough, shortness of breath on exertion and occasional wheezing.     BACKGROUND/INTERVAL:  HPI    Review of Systems A 10 point review of systems was performed and it is as noted above otherwise negative.   Patient Active Problem List   Diagnosis Date Noted  . Chemotherapy-induced neuropathy (HCC) 11/27/2022  . Asthmatic bronchitis , chronic (HCC) 11/27/2022  . Bronchial obstruction 12/23/2021  . Excessive granulation tissue   . History of lobectomy of lung 04/07/2021  . Metastatic cancer to lung (HCC) 08/11/2020  . Essential hypertension 08/11/2020  . Dyslipidemia 08/11/2020  . Spasm of muscle of lower back 08/11/2020  . Peyronie's disease 10/15/2019  . Tobacco use 10/15/2019  . Glaucoma of right eye 03/22/2018  . Erectile dysfunction 03/22/2018  . Decreased vision of right eye 09/21/2017  . Colostomy in place [facility]) 09/21/2017  . Hypogonadism in male 09/21/2017  . Perennial allergic rhinitis with seasonal variation   . Degeneration of lumbar intervertebral disc 06/26/2017  . Rectal cancer (HCC) 08/05/2014    Social History   Tobacco Use  . Smoking status: Former    Types: Cigars  . Smokeless tobacco: Current    Types: Chew  . Tobacco comments:    No Cigars. Does Dip. Khj 02/16/2023  Substance Use Topics  . Alcohol use: Yes    Alcohol/week: 0.0 standard drinks of alcohol    Comment: RARE    Allergies  Allergen Reactions  . Sulfa  Antibiotics Hives  . Morphine And Codeine Other (See Comments)    Altered mental status    Current Meds  Medication Sig  . acetaminophen (TYLENOL) 500 MG tablet Take 1,000 mg by mouth every 6 (six) hours as needed for mild pain.  [name] albuterol (VENTOLIN HFA) 108 (90 Base) MCG/ACT inhaler INHALE 2 PUFFS BY MOUTH INTO THE LUNGS EVERY 6 HOURS AS NEEDED FOR WHEEZING OR SHORTNESS OF BREATH  . amLODipine (NORVASC) 10 MG tablet Take 1 tablet (10 mg total) by mouth daily.  . calcium carbonate (TUMS - DOSED IN MG ELEMENTAL CALCIUM) 500 MG chewable tablet Chew 2 tablets by mouth as needed for indigestion or heartburn.  . cetirizine (ZYRTEC) 10 MG tablet Take 10 mg by mouth at bedtime.   . cyclobenzaprine (FLEXERIL) 10 MG tablet TAKE 1 TABLET BY MOUTH THREE TIMES A DAY AS NEEDED FOR MUSCLE SPASM  . loperamide (IMODIUM) 2 MG capsule Take 4 mg by mouth as needed for diarrhea or loose stools.   . Multiple Vitamin (MULTIVITAMIN WITH MINERALS) TABS tablet Take 1 tablet by mouth every evening.   Monte Fantasia INHUB 250-50 MCG/ACT AEPB INHALE 1 PUFF INTO THE LUNGS IN THE MORNING AND AT BEDTIME.    Immunization History  Administered Date(s) Administered  . Fluad Quad(high Dose 65+) 10/12/2021  . Influenza, High Dose Seasonal PF 11/23/2022  . Influenza,inj,Quad PF,6+ Mos 03/06/2017, 09/21/2017, 11/28/2018, 10/15/2019  . Influenza-Unspecified 11/17/2020  . PFIZER(Purple Top)SARS-COV-2 Vaccination 03/10/2019, 04/09/2019  . PNEUMOCOCCAL CONJUGATE-20 10/12/2021  . Pfizer(Comirnaty)Fall Seasonal Vaccine  12 years and older 11/23/2022  . Respiratory Syncytial Virus Vaccine,Recomb Aduvanted(Arexvy) 02/21/2022  . Tdap 02/21/2016  . Zoster Recombinant(Shingrix) 07/03/2016, 11/23/2022        Objective:     BP 136/84 (BP Location: Left Arm, Patient Position: Sitting, Cuff Size: Normal)   Pulse 84   Temp 97.8 F (36.6 C) (Temporal)   Ht 6\' 3"  (1.905 m)   Wt 272 lb 12.8 oz (123.7 kg)   SpO2 97%   BMI 34.10 kg/m    SpO2: 97 %  GENERAL: HEAD: Normocephalic, atraumatic.  EYES: Pupils equal, round, reactive to light.  No scleral icterus.  MOUTH:  NECK: Supple. No thyromegaly. Trachea midline. No JVD.  No adenopathy. PULMONARY: Good air entry bilaterally.  No adventitious sounds. CARDIOVASCULAR: S1 and S2. Regular rate and rhythm.  ABDOMEN: MUSCULOSKELETAL: No joint deformity, no clubbing, no edema.  NEUROLOGIC:  SKIN: Intact,warm,dry. PSYCH:        Assessment & Plan:   No diagnosis found.  No orders of the defined types were placed in this encounter.   No orders of the defined types were placed in this encounter.      Advised if symptoms do not improve or worsen, to please contact office for sooner follow up or seek emergency care.    I spent xxx minutes of dedicated to the care of this patient on the date of this encounter to include pre-visit review of records, face-to-face time with the patient discussing conditions above, post visit ordering of testing, clinical documentation with the electronic health record, making appropriate referrals as documented, and communicating necessary findings to members of the patients care team.     C. Danice Goltz, MD Advanced Bronchoscopy PCCM [facility]    *This note was generated using voice recognition software/Dragon and/or AI transcription program.  Despite best efforts to proofread, errors can occur which can change the meaning. Any transcriptional errors that result from this process are unintentional and may not be fully corrected at the time of dictation.

## 2023-04-18 NOTE — Patient Instructions (Addendum)
 VISIT SUMMARY:  [name] [name], during your visit today, we discussed your ongoing breathing difficulties following your lobectomy, particularly during a recent episode of bronchitis. We reviewed your symptoms, including increased coughing and chest tightness, and the need for more frequent use of your inhaler. We also discussed the results of your recent CT scan and the need for further intervention.  YOUR PLAN:  -LEFT MAIN BRONCHIAL STENOSIS POST-LOBECTOMY: Bronchial stenosis means there is a narrowing in your bronchial tubes, which can make it hard to breathe. This condition has been causing your increased cough and chest tightness. We have scheduled a bronchial dilation procedure for March 31st at 10 AM to help open up your airways and improve your breathing.  -CHRONIC BRONCHITIS: Chronic bronchitis is a long-term inflammation of the airways that causes coughing and mucus production. You recently had a flare-up of this condition, but it has since improved. Continue using Advair as it has been effective in managing your symptoms.  INSTRUCTIONS:  Please remember to come in for your bronchial dilation procedure on March 31st at 10 AM. Continue using Advair as prescribed. If you experience any worsening of symptoms or have any concerns, please contact our office.  Follow-up in 3 to 4 weeks time.

## 2023-04-18 NOTE — Telephone Encounter (Signed)
 Bronch with Cryoablation and Bronchial Dilation 04/30/2023 at 10:00am Disease of bronchus [J98.09] 31622, 31641, 31630  Synetta Fail please see Bronch info.

## 2023-04-18 NOTE — H&P (View-Only) (Signed)
 Subjective:    Patient ID: [name] [name], male    DOB: 31-Oct-1955, 68 y.o.   MRN: [id number]  Patient Care Team: Alba Cory, MD as PCP - General (Family Medicine) Jeralyn Ruths, MD as Consulting Physician (Oncology) Riki Altes, MD (Urology) [place] Crane, MD as Consulting Physician (Ophthalmology) Alba Cory, MD as Attending Physician (Family Medicine) Salena Saner, MD as Consulting Physician (Pulmonary Disease) Cardiovascular, [facility]  Chief Complaint  Patient presents with  . Follow-up    Cough, shortness of breath on exertion and occasional wheezing.     BACKGROUND/INTERVAL:  HPI    Review of Systems A 10 point review of systems was performed and it is as noted above otherwise negative.   Patient Active Problem List   Diagnosis Date Noted  . Chemotherapy-induced neuropathy (HCC) 11/27/2022  . Asthmatic bronchitis , chronic (HCC) 11/27/2022  . Bronchial obstruction 12/23/2021  . Excessive granulation tissue   . History of lobectomy of lung 04/07/2021  . Metastatic cancer to lung (HCC) 08/11/2020  . Essential hypertension 08/11/2020  . Dyslipidemia 08/11/2020  . Spasm of muscle of lower back 08/11/2020  . Peyronie's disease 10/15/2019  . Tobacco use 10/15/2019  . Glaucoma of right eye 03/22/2018  . Erectile dysfunction 03/22/2018  . Decreased vision of right eye 09/21/2017  . Colostomy in place [facility]) 09/21/2017  . Hypogonadism in male 09/21/2017  . Perennial allergic rhinitis with seasonal variation   . Degeneration of lumbar intervertebral disc 06/26/2017  . Rectal cancer (HCC) 08/05/2014    Social History   Tobacco Use  . Smoking status: Former    Types: Cigars  . Smokeless tobacco: Current    Types: Chew  . Tobacco comments:    No Cigars. Does Dip. Khj 02/16/2023  Substance Use Topics  . Alcohol use: Yes    Alcohol/week: 0.0 standard drinks of alcohol    Comment: RARE    Allergies  Allergen Reactions  . Sulfa  Antibiotics Hives  . Morphine And Codeine Other (See Comments)    Altered mental status    Current Meds  Medication Sig  . acetaminophen (TYLENOL) 500 MG tablet Take 1,000 mg by mouth every 6 (six) hours as needed for mild pain.  [name] albuterol (VENTOLIN HFA) 108 (90 Base) MCG/ACT inhaler INHALE 2 PUFFS BY MOUTH INTO THE LUNGS EVERY 6 HOURS AS NEEDED FOR WHEEZING OR SHORTNESS OF BREATH  . amLODipine (NORVASC) 10 MG tablet Take 1 tablet (10 mg total) by mouth daily.  . calcium carbonate (TUMS - DOSED IN MG ELEMENTAL CALCIUM) 500 MG chewable tablet Chew 2 tablets by mouth as needed for indigestion or heartburn.  . cetirizine (ZYRTEC) 10 MG tablet Take 10 mg by mouth at bedtime.   . cyclobenzaprine (FLEXERIL) 10 MG tablet TAKE 1 TABLET BY MOUTH THREE TIMES A DAY AS NEEDED FOR MUSCLE SPASM  . loperamide (IMODIUM) 2 MG capsule Take 4 mg by mouth as needed for diarrhea or loose stools.   . Multiple Vitamin (MULTIVITAMIN WITH MINERALS) TABS tablet Take 1 tablet by mouth every evening.   Monte Fantasia INHUB 250-50 MCG/ACT AEPB INHALE 1 PUFF INTO THE LUNGS IN THE MORNING AND AT BEDTIME.    Immunization History  Administered Date(s) Administered  . Fluad Quad(high Dose 65+) 10/12/2021  . Influenza, High Dose Seasonal PF 11/23/2022  . Influenza,inj,Quad PF,6+ Mos 03/06/2017, 09/21/2017, 11/28/2018, 10/15/2019  . Influenza-Unspecified 11/17/2020  . PFIZER(Purple Top)SARS-COV-2 Vaccination 03/10/2019, 04/09/2019  . PNEUMOCOCCAL CONJUGATE-20 10/12/2021  . Pfizer(Comirnaty)Fall Seasonal Vaccine  12 years and older 11/23/2022  . Respiratory Syncytial Virus Vaccine,Recomb Aduvanted(Arexvy) 02/21/2022  . Tdap 02/21/2016  . Zoster Recombinant(Shingrix) 07/03/2016, 11/23/2022        Objective:     BP 136/84 (BP Location: Left Arm, Patient Position: Sitting, Cuff Size: Normal)   Pulse 84   Temp 97.8 F (36.6 C) (Temporal)   Ht 6\' 3"  (1.905 m)   Wt 272 lb 12.8 oz (123.7 kg)   SpO2 97%   BMI 34.10 kg/m    SpO2: 97 %  GENERAL: HEAD: Normocephalic, atraumatic.  EYES: Pupils equal, round, reactive to light.  No scleral icterus.  MOUTH:  NECK: Supple. No thyromegaly. Trachea midline. No JVD.  No adenopathy. PULMONARY: Good air entry bilaterally.  No adventitious sounds. CARDIOVASCULAR: S1 and S2. Regular rate and rhythm.  ABDOMEN: MUSCULOSKELETAL: No joint deformity, no clubbing, no edema.  NEUROLOGIC:  SKIN: Intact,warm,dry. PSYCH:        Assessment & Plan:   No diagnosis found.  No orders of the defined types were placed in this encounter.   No orders of the defined types were placed in this encounter.      Advised if symptoms do not improve or worsen, to please contact office for sooner follow up or seek emergency care.    I spent xxx minutes of dedicated to the care of this patient on the date of this encounter to include pre-visit review of records, face-to-face time with the patient discussing conditions above, post visit ordering of testing, clinical documentation with the electronic health record, making appropriate referrals as documented, and communicating necessary findings to members of the patients care team.     C. Danice Goltz, MD Advanced Bronchoscopy PCCM [facility]    *This note was generated using voice recognition software/Dragon and/or AI transcription program.  Despite best efforts to proofread, errors can occur which can change the meaning. Any transcriptional errors that result from this process are unintentional and may not be fully corrected at the time of dictation.

## 2023-04-19 NOTE — Telephone Encounter (Signed)
 For the codes 16109, [id number], [id number] Auth # [id number] valid 04/18/23 to 07/17/2023

## 2023-04-19 NOTE — Telephone Encounter (Signed)
 Noted. Nothing further needed.

## 2023-04-20 ENCOUNTER — Encounter: Payer: Self-pay | Admitting: Pulmonary Disease

## 2023-04-23 ENCOUNTER — Other Ambulatory Visit

## 2023-04-23 ENCOUNTER — Ambulatory Visit: Payer: PPO | Admitting: Oncology

## 2023-04-23 ENCOUNTER — Other Ambulatory Visit: Payer: PPO

## 2023-04-23 NOTE — Telephone Encounter (Signed)
 The 11:00am appt was already taken so I have scheduled [name] [name]'s PFT at 10:00am at [facility] Entrance

## 2023-04-24 ENCOUNTER — Encounter: Payer: Self-pay | Admitting: Oncology

## 2023-04-24 ENCOUNTER — Inpatient Hospital Stay: Payer: PPO | Attending: Oncology | Admitting: Oncology

## 2023-04-24 ENCOUNTER — Encounter
Admission: RE | Admit: 2023-04-24 | Discharge: 2023-04-24 | Disposition: A | Discharge status: Home / Self Care | Source: Ambulatory Visit | Attending: Pulmonary Disease | Admitting: Pulmonary Disease

## 2023-04-24 ENCOUNTER — Other Ambulatory Visit: Payer: Self-pay

## 2023-04-24 ENCOUNTER — Encounter: Payer: Self-pay | Admitting: *Deleted

## 2023-04-24 DIAGNOSIS — C20 Malignant neoplasm of rectum: Secondary | ICD-10-CM | POA: Diagnosis not present

## 2023-04-24 DIAGNOSIS — I10 Essential (primary) hypertension: Secondary | ICD-10-CM

## 2023-04-24 NOTE — Progress Notes (Signed)
 [facility]  [phone]) [phone] [phone]) [phone]  ID: Nayan Proch Bardales OB: [birth year]  MR#: [id number]  GEX#:[id number]  Patient Care Team: Alba Cory, MD as PCP - General (Family Medicine) Jeralyn Ruths, MD as Consulting Physician (Oncology) Riki Altes, MD (Urology) [place] Crane, MD as Consulting Physician (Ophthalmology) Alba Cory, MD as Attending Physician (Family Medicine) Salena Saner, MD as Consulting Physician (Pulmonary Disease) Cardiovascular, [place]  I connected with Linard Millers [place] on 04/24/23 at 10:00 AM EDT by video enabled telemedicine visit and verified that I am speaking with the correct person using two identifiers.   I discussed the limitations, risks, security and privacy concerns of performing an evaluation and management service by telemedicine and the availability of in-person appointments. I also discussed with the patient that there may be a patient responsible charge related to this service. The patient expressed understanding and agreed to proceed.   Other persons participating in the visit and their role in the encounter: Patient, MD.  Patient's location: Home. Provider's location: Clinic.  CHIEF COMPLAINT: Recurrent, stage IV rectal adenocarcinoma.  INTERVAL HISTORY: Patient agreed to video-assisted telemedicine visit for further evaluation and discussion of his imaging results.  Patient reports an extended bout of bronchitis recently, but otherwise feels well.  He currently feels well and is back to his baseline.  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  He has no neurologic complaints.  He denies any weakness or fatigue. He does not complain of peripheral neuropathy today.  He has occasional cough and shortness of breath, but denies any chest pain or hemoptysis.  He denies nausea, vomiting, constipation, or diarrhea. He has noted no blood or  melena in his colostomy bag.  He has no urinary complaints.  Patient offers no further specific complaints today.  REVIEW OF SYSTEMS:   Review of Systems  Constitutional:  Negative for fever, malaise/fatigue and weight loss.  Respiratory: Negative.  Negative for cough, hemoptysis and shortness of breath.   Cardiovascular: Negative.  Negative for chest pain and leg swelling.  Gastrointestinal: Negative.  Negative for abdominal pain, blood in stool, constipation, diarrhea and melena.  Genitourinary: Negative.  Negative for dysuria.  Musculoskeletal: Negative.  Negative for back pain.  Skin: Negative.  Negative for rash.  Neurological: Negative.  Negative for tingling, sensory change, focal weakness, weakness and headaches.  Psychiatric/Behavioral: Negative.  The patient is not nervous/anxious and does not have insomnia.     As per HPI. Otherwise, a complete review of systems is negative.  PAST MEDICAL HISTORY: Past Medical History:  Diagnosis Date   Anemia    Arthritis    Asthmatic bronchitis , chronic (HCC) 2025   Bronchial obstruction 07/2022   Bronchial stenosis, left 06/2022   Bronchial stenosis, left 2025   Cancer (HCC) 2015   rectal, followed by Oncology, permanent colostomy   Cancer of upper lobe of left lung (HCC) 01/2019   Colostomy in place [facility])    Dyslipidemia    Dyspnea    on exertion   History of kidney stones    Hypertension    Mass of upper lobe of left lung 01/2019   Neuromuscular disorder (HCC)    Pneumonia    Rectal cancer (HCC) 2015   Seasonal allergies     PAST SURGICAL HISTORY: Past Surgical History:  Procedure Laterality Date   CHOLECYSTECTOMY     COLON SURGERY     has a permanment ostomy  ENDOBRONCHIAL ULTRASOUND N/A 07/01/2018   Procedure: ENDOBRONCHIAL ULTRASOUND;  Surgeon: Salena Saner, MD;  Location: [facility] ORS;  Service: Cardiopulmonary;  Laterality: N/A;   FLEXIBLE BRONCHOSCOPY Left 11/20/2018   Procedure: FLEXIBLE BRONCHOSCOPY;   Surgeon: Salena Saner, MD;  Location: [facility] ORS;  Service: Cardiopulmonary;  Laterality: Left;   FLEXIBLE BRONCHOSCOPY Left 12/23/2021   Procedure: FLEXIBLE BRONCHOSCOPY;  Surgeon: Salena Saner, MD;  Location: [facility] ORS;  Service: Pulmonary;  Laterality: Left;   FLEXIBLE BRONCHOSCOPY Left 08/09/2022   Procedure: FLEXIBLE BRONCHOSCOPY;  Surgeon: Salena Saner, MD;  Location: [facility] ORS;  Service: Pulmonary;  Laterality: Left;   INTERCOSTAL NERVE BLOCK Left 02/03/2019   Procedure: INTERCOSTAL NERVE BLOCK WITH EXPAREL;  Surgeon: Delight Ovens, MD;  Location: [facility] OR;  Service: Thoracic;  Laterality: Left;   KNEE ARTHROSCOPY Right    KNEE SURGERY Left    torn ligaments (staple in place)   laceration of wrist Right    LOBECTOMY Left 02/03/2019   Procedure: LEFT UPPER LOBECTOMY WITH LYMPH NODE DISSECTION;  Surgeon: Delight Ovens, MD;  Location: [facility] OR;  Service: Thoracic;  Laterality: Left;   LUMBAR DISC SURGERY     ruptured disc   PORT A CATH REVISION Left 08/05/2018   Procedure: PORT A CATH REVISION, REPOSITION LEFT;  Surgeon: Earline Mayotte, MD;  Location: [facility] ORS;  Service: General;  Laterality: Left;   PORT-A-CATH REMOVAL Right 12/02/2014   Procedure: REMOVAL PORT-A-CATH;  Surgeon: Ida Rogue, MD;  Location: [facility] ORS;  Service: General;  Laterality: Right;   PORTACATH PLACEMENT     PORTACATH PLACEMENT Left 07/29/2018   Procedure: INSERTION PORT-A-CATH LEFT;  Surgeon: Earline Mayotte, MD;  Location: [facility] ORS;  Service: General;  Laterality: Left;   RECTAL BIOPSY  04/30/2013   RIGID BRONCHOSCOPY Left 08/09/2022   Procedure: RIGID BRONCHOSCOPY;  Surgeon: Salena Saner, MD;  Location: [facility] ORS;  Service: Pulmonary;  Laterality: Left;   THORACOTOMY/LOBECTOMY Left 02/03/2019   Procedure: MINITHORACOTOMY;  Surgeon: Delight Ovens, MD;  Location: [facility] OR;  Service: Thoracic;  Laterality: Left;   VIDEO ASSISTED THORACOSCOPY (VATS)/WEDGE RESECTION Left  02/03/2019   Procedure: VIDEO ASSISTED THORACOSCOPY (VATS)/LUNG RESECTION;  Surgeon: Delight Ovens, MD;  Location: [facility] OR;  Service: Thoracic;  Laterality: Left;   VIDEO BRONCHOSCOPY N/A 02/03/2019   Procedure: VIDEO BRONCHOSCOPY;  Surgeon: Delight Ovens, MD;  Location: [facility] OR;  Service: Thoracic;  Laterality: N/A;   VIDEO BRONCHOSCOPY N/A 10/28/2021   Procedure: VIDEO BRONCHOSCOPY WITH CRYO and DEBULKING;  Surgeon: Salena Saner, MD;  Location: [facility] ORS;  Service: Cardiopulmonary;  Laterality: N/A;   VIDEO BRONCHOSCOPY WITH ENDOBRONCHIAL ULTRASOUND N/A 12/30/2018   Procedure: VIDEO BRONCHOSCOPY WITH ENDOBRONCHIAL ULTRASOUND;  Surgeon: Delight Ovens, MD;  Location: [facility] OR;  Service: Thoracic;  Laterality: N/A;   XI ROBOT ABDOMINAL PERINEAL RESECTION  10/09/2013    FAMILY HISTORY: Father with prostate cancer.  COPD, CHF.     ADVANCED DIRECTIVES:    HEALTH MAINTENANCE: Social History   Tobacco Use   Smoking status: Former    Types: Cigars   Smokeless tobacco: Current    Types: Chew   Tobacco comments:    No Cigars. Does Dip. Khj 02/16/2023  Vaping Use   Vaping status: Never Used  Substance Use Topics   Alcohol use: Yes    Alcohol/week: 0.0 standard drinks of alcohol    Comment: RARE   Drug use: No     Colonoscopy:  PAP:  Bone density:  Lipid panel:  Allergies  Allergen Reactions   Sulfa Antibiotics Hives   Morphine And Codeine Other (See Comments)    Altered mental status    Current Outpatient Medications  Medication Sig Dispense Refill   acetaminophen (TYLENOL) 500 MG tablet Take 1,000 mg by mouth every 6 (six) hours as needed for mild pain.     albuterol (VENTOLIN HFA) 108 (90 Base) MCG/ACT inhaler INHALE 2 PUFFS BY MOUTH INTO THE LUNGS EVERY 6 HOURS AS NEEDED FOR WHEEZING OR SHORTNESS OF BREATH 18 each 1   amLODipine (NORVASC) 10 MG tablet Take 1 tablet (10 mg total) by mouth daily. 90 tablet 3   calcium carbonate (TUMS - DOSED IN MG ELEMENTAL  CALCIUM) 500 MG chewable tablet Chew 2 tablets by mouth as needed for indigestion or heartburn.     cetirizine (ZYRTEC) 10 MG tablet Take 10 mg by mouth at bedtime.      cyclobenzaprine (FLEXERIL) 10 MG tablet TAKE 1 TABLET BY MOUTH THREE TIMES A DAY AS NEEDED FOR MUSCLE SPASM 90 tablet 3   loperamide (IMODIUM) 2 MG capsule Take 4 mg by mouth as needed for diarrhea or loose stools.      Multiple Vitamin (MULTIVITAMIN WITH MINERALS) TABS tablet Take 1 tablet by mouth every evening.      tadalafil (CIALIS) 20 MG tablet TAKE 1 TAB BY MOUTH 1 HOUR PRIOR TO INTERCOURSE AS NEEDED 30 tablet 3   WIXELA INHUB 250-50 MCG/ACT AEPB INHALE 1 PUFF INTO THE LUNGS IN THE MORNING AND AT BEDTIME. 60 each 5   No current facility-administered medications for this visit.   Facility-Administered Medications Ordered in Other Visits  Medication Dose Route Frequency Provider Last Rate Last Admin   heparin lock flush 100 unit/mL  500 Units Intravenous Once Jeralyn Ruths, MD       sodium chloride flush (NS) 0.9 % injection 10 mL  10 mL Intravenous Once Jeralyn Ruths, MD        OBJECTIVE: There were no vitals filed for this visit.   There is no height or weight on file to calculate BMI.    ECOG FS:0 - Asymptomatic  General: Well-developed, well-nourished, no acute distress. HEENT: Normocephalic. Neuro: Alert, answering all questions appropriately. Cranial nerves grossly intact. Psych: Normal affect.  LAB RESULTS:  Lab Results  Component Value Date   NA 138 04/09/2023   K 3.6 04/09/2023   CL 108 04/09/2023   CO2 23 04/09/2023   GLUCOSE 114 (H) 04/09/2023   BUN 18 04/09/2023   CREATININE 1.03 04/09/2023   CALCIUM 8.3 (L) 04/09/2023   PROT 6.6 04/09/2023   ALBUMIN 3.8 04/09/2023   AST 21 04/09/2023   ALT 21 04/09/2023   ALKPHOS 67 04/09/2023   BILITOT 0.5 04/09/2023   GFRNONAA >60 04/09/2023   GFRAA >60 09/16/2019    Lab Results  Component Value Date   WBC 4.2 04/09/2023   NEUTROABS 2.5  04/09/2023   HGB 11.2 (L) 04/09/2023   HCT 35.0 (L) 04/09/2023   MCV 84.5 04/09/2023   PLT 427 (H) 04/09/2023     STUDIES: CT CHEST ABDOMEN PELVIS W CONTRAST Result Date: 04/09/2023 CLINICAL DATA:  Follow-up rectal cancer.  * Tracking Code: BO * EXAM: CT CHEST, ABDOMEN, AND PELVIS WITH CONTRAST TECHNIQUE: Multidetector CT imaging of the chest, abdomen and pelvis was performed following the standard protocol during bolus administration of intravenous contrast. RADIATION DOSE REDUCTION: This exam was performed according to the departmental dose-optimization program which includes automated exposure control,  adjustment of the mA and/or kV according to patient size and/or use of iterative reconstruction technique. CONTRAST:  OMNIPAQUE IOHEXOL 300 MG/ML  SOLN COMPARISON:  Multiple priors including most recent CT April 04, 2022 FINDINGS: CT CHEST FINDINGS Cardiovascular: Accessed left chest Port-A-Cath with tip near the superior cavoatrial junction. Aortic atherosclerosis. Normal size heart. Coronary artery calcifications. Mediastinum/Nodes: No suspicious thyroid nodule. No pathologically enlarged mediastinal, hilar or axillary lymph nodes. Esophagus is grossly unremarkable. Tiny hiatal hernia. Lungs/Pleura: New 5 mm left lower lobe pulmonary nodule on image 109/3 and another on image 126/3. Stable 5 mm left lower lobe pulmonary nodule on image 101/3. Scattered atelectasis/scarring. Musculoskeletal: No aggressive lytic or blastic lesion of bone. Remote left rib fractures. CT ABDOMEN PELVIS FINDINGS Hepatobiliary: No suspicious hepatic lesion. Gallbladder surgically absent. No biliary ductal dilation. Pancreas: No pancreatic ductal dilation or evidence of acute inflammation. Spleen: No splenomegaly. Adrenals/Urinary Tract: Bilateral adrenal glands appear normal. No hydronephrosis. Kidneys demonstrate symmetric enhancement. Urinary bladder is unremarkable for degree of distension. Stomach/Bowel: Radiopaque  enteric contrast material traverses the hepatic flexure. Tiny hiatal hernia. No pathologic dilation of small or large bowel. Colonic stool burden compatible with constipation. Colonic diverticulosis. Prior rectosigmoid colectomy with left lower quadrant colostomy formation. There is similar soft tissue in fluid stranding in the mesorectal/presacral space for instance on image 119/2. Vascular/Lymphatic: No pathologically enlarged abdominal or pelvic lymph nodes. Aortic atherosclerosis. Reproductive: Prostate is unremarkable. Other: No significant abdominopelvic free fluid. Musculoskeletal: No aggressive lytic or blastic lesion of bone. Multilevel degenerative changes spine. IMPRESSION: 1. Prior rectosigmoid colectomy with left lower quadrant colostomy formation. Similar soft tissue and fluid stranding in the mesorectal/presacral space, nonspecific and possibly reflecting posttreatment change. 2. New 5 mm left lower lobe pulmonary nodules, nonspecific and possibly infectious/inflammatory but metastatic disease is not excluded. Suggest attention on short-term interval follow-up dedicated chest CT. 3. No evidence of metastatic disease within the abdomen or pelvis. 4. Colonic stool burden compatible with constipation. 5. Aortic atherosclerosis. Electronically Signed   By: Maudry Mayhew M.D.   On: 04/09/2023 15:24    ONCOLOGY HISTORY:  Patient was initially considered a clinical stage IIIa, but after completing neoadjuvant 5-FU and XRT followed by surgical excision on October 08, 2013 he was noted to be pathologic stage Ia.  He declined adjuvant FOLFOX at that time.  More recently, patient's CEA started trending up and CT of the chest revealed a large 7.7 cm left upper lobe mass biopsy consistent with rectal adenocarcinoma.  Patient completed cycle 6 of FOLFOX plus Avastin on October 16, 2018.  Repeat biopsy did not reveal any evidence of malignancy, but persistent fungal infection.  Patient underwent repeat  bronchoscopy on December 30, 2018 which did in fact reveal residual malignancy.  He subsequently underwent surgical resection on February 03, 2019 with complete removal of residual disease.  One lymph node had a "fragment" of malignancy.  Patient did not require additional adjuvant chemotherapy or XRT.   ASSESSMENT: Recurrent, stage IV rectal adenocarcinoma.  PLAN:    Recurrent, stage IV rectal adenocarcinoma: See oncology history as above.  No evidence of disease.  Patient's most recent CT scan on April 09, 2023 reviewed independently and reported as above with no obvious evidence of recurrent or progressive disease.  Patient reports he is having a bronchoscopy with possible cryo therapy in approximately 1 week.  CEA remains within normal limits.  No intervention is needed at this time.  Will continue evaluation and imaging every 6 months until patient is 5  years removed from his surgery at which point we will transition to yearly visits.  Continue follow-up with pulmonology as indicated.  Return to clinic in 6 months with repeat imaging video-assisted telemedicine visit.  Cough/shortness of breath: Significantly improved after ablation of granulation tissue by pulmonary.  Continue follow-up with pulmonary as indicated. Elevated PSA: Patient is going to monitor his external testosterone use.  Follow-up with urology as indicated. Pulmonary nodule: Patient noted to have a 5 mm left lower lobe nodule, likely infectious given his recent bronchitis.  Repeat CT scan in 6 months as above.  I provided 20 minutes of face-to-face video visit time during this encounter which included chart review, counseling, and coordination of care as documented above.     Patient expressed understanding and was in agreement with this plan. He also understands that He can call clinic at any time with any questions, concerns, or complaints.    Jeralyn Ruths, MD   04/24/2023 1:03 PM

## 2023-04-24 NOTE — Telephone Encounter (Signed)
 HTA PRIOR AUTH# [id number] FROM 04/18/23-07/17/23 FOR [id number]

## 2023-04-24 NOTE — Patient Instructions (Addendum)
 Your procedure is scheduled on: 04/30/23 - Monday Report to the Registration Desk on the 1st floor of the [facility]. To find out your arrival time, please call [phone] between 1PM - 3PM on: 04/27/23 Friday If your arrival time is 6:00 am, do not arrive before that time as the [facility] entrance doors do not open until 6:00 am.  REMEMBER: Instructions that are not followed completely may result in serious medical risk, up to and including death; or upon the discretion of your surgeon and anesthesiologist your surgery may need to be rescheduled.  Do not eat food or drink any liquids after midnight the night before surgery.  No gum chewing or hard candies.  One week prior to surgery: Stop Anti-inflammatories (NSAIDS) such as Advil, Aleve, Ibuprofen, Motrin, Naproxen, Naprosyn and Aspirin based products such as Excedrin, Goody's Powder, BC Powder. You may take Tylenol if needed for pain up until the day of surgery  Stop ANY OVER THE COUNTER supplements until after surgery: multivitamin  .Hold tadalafil (CIALIS) beginning 04/28/23.   ON THE DAY OF SURGERY ONLY TAKE THESE MEDICATIONS WITH SIPS OF WATER:  WIXELA INHUB  amLODipine (NORVASC)   Use inhalers albuterol (VENTOLIN HFA)  on the day of surgery and bring to the hospital.  Fleets enema or bowel prep as directed.  No Alcohol for 24 hours before or after surgery.  No Smoking including e-cigarettes for 24 hours before surgery.  No chewable tobacco products for at least 6 hours before surgery.  No nicotine patches on the day of surgery.  Do not use any "recreational" drugs for at least a week (preferably 2 weeks) before your surgery.  Please be advised that the combination of cocaine and anesthesia may have negative outcomes, up to and including death. If you test positive for cocaine, your surgery will be cancelled.  On the morning of surgery brush your teeth with toothpaste and water, you may rinse your mouth with  mouthwash if you wish. Do not swallow any toothpaste or mouthwash.  Do not wear jewelry, make-up, hairpins, clips or nail polish.  For welded (permanent) jewelry: bracelets, anklets, waist bands, etc.  Please have this removed prior to surgery.  If it is not removed, there is a chance that hospital personnel will need to cut it off on the day of surgery.  Do not wear lotions, powders, or perfumes.   Do not shave body hair from the neck down 48 hours before surgery.  Contact lenses, hearing aids and dentures may not be worn into surgery.  Do not bring valuables to the hospital. [facility] is not responsible for any missing/lost belongings or valuables.   Notify your doctor if there is any change in your medical condition (cold, fever, infection).  Wear comfortable clothing (specific to your surgery type) to the hospital.  After surgery, you can help prevent lung complications by doing breathing exercises.  Take deep breaths and cough every 1-2 hours. Your doctor may order a device called an Incentive Spirometer to help you take deep breaths.  When coughing or sneezing, hold a pillow firmly against your incision with both hands. This is called "splinting." Doing this helps protect your incision. It also decreases belly discomfort.  If you are being admitted to the hospital overnight, leave your suitcase in the car. After surgery it may be brought to your room.  In case of increased patient census, it may be necessary for you, the patient, to continue your postoperative care in the Same  Day Surgery department.  If you are being discharged the day of surgery, you will not be allowed to drive home. You will need a responsible individual to drive you home and stay with you for 24 hours after surgery.   If you are taking public transportation, you will need to have a responsible individual with you.  Please call the Pre-admissions Testing Dept. at [phone] if you have any questions  about these instructions.  Surgery Visitation Policy:  Patients having surgery or a procedure may have two visitors.  Children under the age of 21 must have an adult with them who is not the patient.  Inpatient Visitation:    Visiting hours are 7 a.m. to 8 p.m. Up to four visitors are allowed at one time in a patient room. The visitors may rotate out with other people during the day.  One visitor age 36 or older may stay with the patient overnight and must be in the room by 8 p.m.

## 2023-04-25 ENCOUNTER — Encounter: Payer: Self-pay | Admitting: *Deleted

## 2023-04-26 ENCOUNTER — Encounter
Admission: RE | Admit: 2023-04-26 | Discharge: 2023-04-26 | Disposition: A | Discharge status: Home / Self Care | Source: Ambulatory Visit | Attending: Pulmonary Disease | Admitting: Pulmonary Disease

## 2023-04-26 DIAGNOSIS — I10 Essential (primary) hypertension: Secondary | ICD-10-CM | POA: Diagnosis not present

## 2023-04-26 DIAGNOSIS — Z01810 Encounter for preprocedural cardiovascular examination: Secondary | ICD-10-CM | POA: Diagnosis not present

## 2023-04-30 ENCOUNTER — Encounter: Payer: Self-pay | Admitting: Pulmonary Disease

## 2023-04-30 ENCOUNTER — Ambulatory Visit: Admitting: Registered Nurse

## 2023-04-30 ENCOUNTER — Other Ambulatory Visit: Payer: Self-pay

## 2023-04-30 ENCOUNTER — Encounter
Admission: RE | Disposition: A | Discharge status: Home / Self Care | Payer: Self-pay | Source: Ambulatory Visit | Attending: Pulmonary Disease

## 2023-04-30 ENCOUNTER — Ambulatory Visit
Admission: RE | Admit: 2023-04-30 | Discharge: 2023-04-30 | Disposition: A | Discharge status: Home / Self Care | Source: Ambulatory Visit | Attending: Pulmonary Disease | Admitting: Pulmonary Disease

## 2023-04-30 ENCOUNTER — Ambulatory Visit

## 2023-04-30 DIAGNOSIS — Z85118 Personal history of other malignant neoplasm of bronchus and lung: Secondary | ICD-10-CM | POA: Diagnosis not present

## 2023-04-30 DIAGNOSIS — M1990 Unspecified osteoarthritis, unspecified site: Secondary | ICD-10-CM | POA: Diagnosis not present

## 2023-04-30 DIAGNOSIS — E785 Hyperlipidemia, unspecified: Secondary | ICD-10-CM | POA: Diagnosis not present

## 2023-04-30 DIAGNOSIS — J9809 Other diseases of bronchus, not elsewhere classified: Secondary | ICD-10-CM | POA: Diagnosis not present

## 2023-04-30 DIAGNOSIS — Z902 Acquired absence of lung [part of]: Secondary | ICD-10-CM | POA: Diagnosis not present

## 2023-04-30 DIAGNOSIS — C7802 Secondary malignant neoplasm of left lung: Secondary | ICD-10-CM

## 2023-04-30 DIAGNOSIS — F17220 Nicotine dependence, chewing tobacco, uncomplicated: Secondary | ICD-10-CM | POA: Insufficient documentation

## 2023-04-30 DIAGNOSIS — Z87891 Personal history of nicotine dependence: Secondary | ICD-10-CM | POA: Diagnosis not present

## 2023-04-30 DIAGNOSIS — Z85048 Personal history of other malignant neoplasm of rectum, rectosigmoid junction, and anus: Secondary | ICD-10-CM | POA: Insufficient documentation

## 2023-04-30 DIAGNOSIS — Z933 Colostomy status: Secondary | ICD-10-CM | POA: Diagnosis not present

## 2023-04-30 DIAGNOSIS — Z48813 Encounter for surgical aftercare following surgery on the respiratory system: Secondary | ICD-10-CM | POA: Diagnosis not present

## 2023-04-30 DIAGNOSIS — J4489 Other specified chronic obstructive pulmonary disease: Secondary | ICD-10-CM | POA: Diagnosis not present

## 2023-04-30 DIAGNOSIS — R0602 Shortness of breath: Secondary | ICD-10-CM | POA: Diagnosis not present

## 2023-04-30 DIAGNOSIS — Z79899 Other long term (current) drug therapy: Secondary | ICD-10-CM | POA: Insufficient documentation

## 2023-04-30 DIAGNOSIS — C20 Malignant neoplasm of rectum: Secondary | ICD-10-CM | POA: Diagnosis not present

## 2023-04-30 DIAGNOSIS — I10 Essential (primary) hypertension: Secondary | ICD-10-CM | POA: Insufficient documentation

## 2023-04-30 HISTORY — PX: FLEXIBLE BRONCHOSCOPY: SHX5094

## 2023-04-30 SURGERY — BRONCHOSCOPY, FLEXIBLE
Anesthesia: General | Laterality: Left

## 2023-04-30 MED ORDER — IPRATROPIUM-ALBUTEROL 0.5-2.5 (3) MG/3ML IN SOLN
3.0000 mL | Freq: Once | RESPIRATORY_TRACT | Status: AC
  Administered 2023-04-30: 3 mL via RESPIRATORY_TRACT

## 2023-04-30 MED ORDER — FENTANYL CITRATE (PF) 100 MCG/2ML IJ SOLN
INTRAMUSCULAR | Status: AC
  Filled 2023-04-30: qty 2

## 2023-04-30 MED ORDER — ROCURONIUM BROMIDE 10 MG/ML (PF) SYRINGE
PREFILLED_SYRINGE | INTRAVENOUS | Status: AC
  Filled 2023-04-30: qty 10

## 2023-04-30 MED ORDER — SUGAMMADEX SODIUM 200 MG/2ML IV SOLN
INTRAVENOUS | Status: DC | PRN
  Administered 2023-04-30: 247.4 mg via INTRAVENOUS

## 2023-04-30 MED ORDER — PROPOFOL 10 MG/ML IV BOLUS
INTRAVENOUS | Status: AC
  Filled 2023-04-30: qty 20

## 2023-04-30 MED ORDER — ONDANSETRON HCL 4 MG/2ML IJ SOLN
INTRAMUSCULAR | Status: DC | PRN
  Administered 2023-04-30: 4 mg via INTRAVENOUS

## 2023-04-30 MED ORDER — DROPERIDOL 2.5 MG/ML IJ SOLN: 0.6250 mg | Freq: Once | INTRAMUSCULAR | Status: DC | PRN

## 2023-04-30 MED ORDER — MIDAZOLAM HCL 2 MG/2ML IJ SOLN
INTRAMUSCULAR | Status: DC | PRN
  Administered 2023-04-30: 2 mg via INTRAVENOUS

## 2023-04-30 MED ORDER — ONDANSETRON HCL 4 MG/2ML IJ SOLN
INTRAMUSCULAR | Status: AC
  Filled 2023-04-30: qty 2

## 2023-04-30 MED ORDER — LIDOCAINE HCL (PF) 2 % IJ SOLN
INTRAMUSCULAR | Status: AC
  Filled 2023-04-30: qty 5

## 2023-04-30 MED ORDER — CHLORHEXIDINE GLUCONATE 0.12 % MT SOLN
OROMUCOSAL | Status: AC
  Filled 2023-04-30: qty 15

## 2023-04-30 MED ORDER — DEXAMETHASONE SODIUM PHOSPHATE 10 MG/ML IJ SOLN
INTRAMUSCULAR | Status: DC | PRN
Start: 2023-04-30 — End: 2023-04-30
  Administered 2023-04-30: 10 mg via INTRAVENOUS

## 2023-04-30 MED ORDER — PROPOFOL 10 MG/ML IV BOLUS
INTRAVENOUS | Status: DC | PRN
  Administered 2023-04-30: 200 mg via INTRAVENOUS

## 2023-04-30 MED ORDER — IPRATROPIUM-ALBUTEROL 0.5-2.5 (3) MG/3ML IN SOLN
RESPIRATORY_TRACT | Status: AC
  Filled 2023-04-30: qty 3

## 2023-04-30 MED ORDER — LACTATED RINGERS IV SOLN: INTRAVENOUS | Status: DC | PRN

## 2023-04-30 MED ORDER — DEXAMETHASONE SODIUM PHOSPHATE 10 MG/ML IJ SOLN
INTRAMUSCULAR | Status: AC
  Filled 2023-04-30: qty 1

## 2023-04-30 MED ORDER — SODIUM CHLORIDE 0.9 % IV SOLN: Freq: Once | INTRAVENOUS | Status: DC

## 2023-04-30 MED ORDER — LACTATED RINGERS IV SOLN: INTRAVENOUS | Status: DC

## 2023-04-30 MED ORDER — CHLORHEXIDINE GLUCONATE 0.12 % MT SOLN
15.0000 mL | Freq: Once | OROMUCOSAL | Status: AC
  Administered 2023-04-30: 15 mL via OROMUCOSAL

## 2023-04-30 MED ORDER — LIDOCAINE HCL (CARDIAC) PF 100 MG/5ML IV SOSY
PREFILLED_SYRINGE | INTRAVENOUS | Status: DC | PRN
Start: 2023-04-30 — End: 2023-04-30
  Administered 2023-04-30: 100 mg via INTRAVENOUS

## 2023-04-30 MED ORDER — FENTANYL CITRATE (PF) 100 MCG/2ML IJ SOLN
INTRAMUSCULAR | Status: DC | PRN
  Administered 2023-04-30 (×2): 50 ug via INTRAVENOUS

## 2023-04-30 MED ORDER — HEPARIN SOD (PORK) LOCK FLUSH 100 UNIT/ML IV SOLN
INTRAVENOUS | Status: AC
Start: 2023-04-30 — End: ?
  Filled 2023-04-30: qty 5

## 2023-04-30 MED ORDER — SODIUM CHLORIDE 0.9% FLUSH
10.0000 mL | Freq: Once | INTRAVENOUS | Status: AC
  Administered 2023-04-30: 10 mL via INTRAVENOUS

## 2023-04-30 MED ORDER — FENTANYL CITRATE (PF) 100 MCG/2ML IJ SOLN: 25.0000 ug | INTRAMUSCULAR | Status: DC | PRN

## 2023-04-30 MED ORDER — MIDAZOLAM HCL 2 MG/2ML IJ SOLN
INTRAMUSCULAR | Status: AC
  Filled 2023-04-30: qty 2

## 2023-04-30 MED ORDER — ORAL CARE MOUTH RINSE: 15.0000 mL | Freq: Once | OROMUCOSAL | Status: AC

## 2023-04-30 MED ORDER — ROCURONIUM BROMIDE 100 MG/10ML IV SOLN
INTRAVENOUS | Status: DC | PRN
  Administered 2023-04-30: 60 mg via INTRAVENOUS

## 2023-04-30 MED ORDER — HEPARIN SOD (PORK) LOCK FLUSH 100 UNIT/ML IV SOLN
500.0000 [IU] | INTRAVENOUS | Status: AC | PRN
  Administered 2023-04-30: 500 [IU]

## 2023-04-30 MED ORDER — SUGAMMADEX SODIUM 200 MG/2ML IV SOLN
INTRAVENOUS | Status: AC
  Filled 2023-04-30: qty 4

## 2023-04-30 NOTE — Anesthesia Procedure Notes (Signed)
 Procedure Name: Intubation Date/Time: 04/30/2023 10:14 AM  Performed by: Clydia Llano, CRNAPre-anesthesia Checklist: Patient identified, Patient being monitored, Timeout performed, Emergency Drugs available and Suction available Patient Re-evaluated:Patient Re-evaluated prior to induction Oxygen Delivery Method: Circle system utilized Preoxygenation: Pre-oxygenation with 100% oxygen Induction Type: IV induction Ventilation: Mask ventilation without difficulty and Oral airway inserted - appropriate to patient size Laryngoscope Size: 4 and McGrath Grade View: Grade I Tube type: Oral Tube size: 9.0 mm Number of attempts: 1 Airway Equipment and Method: Stylet Placement Confirmation: ETT inserted through vocal cords under direct vision, positive ETCO2 and breath sounds checked- equal and bilateral Secured at: 23 cm Tube secured with: Tape Dental Injury: Teeth and Oropharynx as per pre-operative assessment

## 2023-04-30 NOTE — Transfer of Care (Signed)
 Immediate Anesthesia Transfer of Care Note  Patient: [name] [name]  Procedure(s) Performed: BRONCHOSCOPY, FLEXIBLE (Left)  Patient Location: PACU  Anesthesia Type:General  Level of Consciousness: awake, alert , and oriented  Airway & Oxygen Therapy: Patient Spontanous Breathing  Post-op Assessment: Report given to RN and Post -op Vital signs reviewed and stable  Post vital signs: Reviewed and stable  Last Vitals:  Vitals Value Taken Time  BP 140/69 04/30/23 1043  Temp    Pulse 64 04/30/23 1044  Resp 14 04/30/23 1044  SpO2 100 % 04/30/23 1044  Vitals shown include unfiled device data.  Last Pain:  Vitals:   04/30/23 0849  TempSrc: Temporal  PainSc: 0-No pain         Complications: No notable events documented.

## 2023-04-30 NOTE — Anesthesia Preprocedure Evaluation (Signed)
 Anesthesia Evaluation  Patient identified by MRN, date of birth, ID band Patient awake    Reviewed: Allergy & Precautions, NPO status , Patient's Chart, lab work & pertinent test results  History of Anesthesia Complications Negative for: history of anesthetic complications  Airway Mallampati: II  TM Distance: >3 FB Neck ROM: Full    Dental no notable dental hx. (+) Teeth Intact   Pulmonary shortness of breath, neg sleep apnea, neg COPD, neg recent URI, Patient abstained from smoking.Not current smoker, former smoker   Pulmonary exam normal breath sounds clear to auscultation       Cardiovascular Exercise Tolerance: Good METShypertension, Pt. on medications (-) angina (-) CAD and (-) Past MI (-) dysrhythmias  Rhythm:Regular Rate:Normal - Systolic murmurs    Neuro/Psych  Neuromuscular disease  negative psych ROS   GI/Hepatic ,neg GERD  ,,(+)     (-) substance abuse    Endo/Other  neg diabetes    Renal/GU negative Renal ROS     Musculoskeletal  (+) Arthritis ,    Abdominal   Peds  Hematology   Anesthesia Other Findings Past Medical History: No date: Anemia No date: Arthritis 07/2022: Bronchial obstruction 06/2022: Bronchial stenosis, left 2015: Cancer (HCC)     Comment:  rectal, followed by Oncology, permanent colostomy 01/2019: Cancer of upper lobe of left lung (HCC) No date: Colostomy in place [facility]) No date: Dyslipidemia No date: Dyspnea     Comment:  on exertion No date: History of kidney stones No date: Hypertension 01/2019: Mass of upper lobe of left lung No date: Neuromuscular disorder (HCC) No date: Pneumonia 2015: Rectal cancer (HCC) No date: Seasonal allergies  Reproductive/Obstetrics                             Anesthesia Physical Anesthesia Plan  ASA: 3  Anesthesia Plan: General   Post-op Pain Management:    Induction: Intravenous  PONV Risk Score and Plan:  2 and Ondansetron and Dexamethasone  Airway Management Planned: Oral ETT  Additional Equipment: None  Intra-op Plan:   Post-operative Plan: Extubation in OR  Informed Consent: I have reviewed the patients History and Physical, chart, labs and discussed the procedure including the risks, benefits and alternatives for the proposed anesthesia with the patient or authorized representative who has indicated his/her understanding and acceptance.     Dental advisory given  Plan Discussed with: CRNA and Surgeon  Anesthesia Plan Comments: (Discussed risks of anesthesia with patient, including PONV, sore throat, lip/dental/eye damage. Rare risks discussed as well, such as cardiorespiratory and neurological sequelae, and allergic reactions. Discussed the role of CRNA in patient's perioperative care. Patient understands.)       Anesthesia Quick Evaluation

## 2023-04-30 NOTE — Op Note (Signed)
 PROCEDURE: BRONCHOSCOPY Bronchoscopy Bronchoscopic spray cryotherapy for tissue ablation Endobronchial balloon dilation  PROCEDURE DATE: 04/30/2023  TIME:  NAMECLIFFORD [name]  DOB:11/01/1955  MRN: [id number] LOC:  ARPO/None         Indications/Preliminary Diagnosis: Recurrent bronchial stenosis/obstruction due to excessive granulation tissue post left upper lobectomy   Consent: (Place X beside choice/s below)  The benefits, risks and possible complications of the procedure were        explained to:  _X__ patient  ___ patient's family  ___ other:___________  who verbalized understanding and gave:  ___ verbal  ___ written  _X__ verbal and written  ___ telephone  ___ other:________ consent.      Unable to obtain consent; procedure performed on emergent basis.     Other:    Benefits, limitations and potential complications of the procedure were discussed with the patient/family.  Complications from bronchoscopy are rare and most often minor, but if they occur they may include breathing difficulty, vocal cord spasm, hoarseness, slight fever, vomiting, dizziness, bronchospasm, infection, low blood oxygen, bleeding from biopsy site, or an allergic reaction to medications.  It is uncommon for patients to experience other more serious complications for example: Collapsed lung requiring chest tube placement, respiratory failure, heart attack and/or cardiac arrhythmia.  Patient agreed to proceed   Anesthesia type: General endotracheal   Surgeon: Gailen Shelter, MD Assistant/Scrub: Denyse Amass RRT Circulator: Leonie Man, RRT Anesthesiologist/CRNA: Lenard Simmer, MD/Deana Lawerance Cruel, CRNA Cytotechnology: Not utilized  PROCEDURE DETAILS: Patient was taken to Procedure Room 2 (Bronchoscopy Suite) in the OR area.  Appropriate Timeout performed and correct patient, name, ID and laterality confirmed.  Patient was inducted under general anesthesia and intubated with an 9.0 ET tube without  difficulty.  Once the patient was under adequate general anesthesia a Portex adapter was placed in the ET tube flange.  Through the Portex adapter the Olympus video therapeutic bronchoscope was then advanced.  The visible distal trachea was normal, carina appeared sharp. The right upper lobe , right middle lobe, lower lobe bronchi were free of endobronchial lesions and no mucosal abnormalities.  There were were tenacious nonpurulent secretions that were suctioned and lavaged until clear.  At this point bronchoscope was brought to the left mainstem and examination revealed luminal stenosis at the distal mainstem and proximal left lower lobe bronchus.  The previously noted "ball-valve mechanism" occlusion of the left mainstem bronchus by tissue protruding from a left upper lobectomy suture line was not as pronounced.  Likewise the area of stenosis was not as pronounced as well.  The mucosa had no significant abnormalities.  The area of obstruction was bypassed and examination with the bronchoscope of the lower lobe subsegments showed them to be patent but with tenacious clear secretions noted.  These were lavaged until cleared. Once this was performed I proceeded to perform one 3 cycle cryoablation with a truFreeze spray cryotherapy catheter.  A total of 3 cycles of 5-second freezes were performed on the tissue.  The patient was appropriately passively vented by deflating the ET tube cuff and disconnecting from the circuit, breath-holding per anesthesia.  After the appropriate freezing time was completed the airway was then examined thoroughly to ensure adequate hemostasis.  At this point utilizing a [employer] CRE pulmonary balloon the patient had the area of stricture dilated stepwise fashion beginning at 8 mm to ending at 9 mm each dilation for 30 seconds.  Following balloon dilation the bronchoscope was removed once we excellent hemostasis was  ensured. At this point the procedure was terminated and the  patient was allowed to emerge from general anesthesia. Patient was extubated in the procedure room and transported to the PACU in satisfactory condition.  The patient did not have any complaints of chest pain or shortness of breath postprocedure.  Postprocedure examination shows symmetrical lung sounds. Postprocedure chest x-ray showed no pneumothorax.  Patient tolerated the procedure well.   TECHNICAL PROCEDURES: (Place X beside choice below)   Procedures  Description    None     Electrocautery   X  Cryotherapy   X  Balloon Dilatation     Bronchography     Stent Placement     Therapeutic Aspiration     Laser/Argon Plasma    Brachytherapy Catheter Placement    Foreign Body Removal       SPECIMENS (Sites): (Place X beside choice below)  Specimens Description  X No Specimens Obtained     Washings    Lavage    Biopsies    Fine Needle Aspirates    Brushings    Sputum    FINDINGS:  Area of bronchial stenosis on the left.  Stenosis not as pronounced as prior.   During cryotherapy   After balloon dilation   ESTIMATED BLOOD LOSS: none  COMPLICATIONS/RESOLUTION: none   IMPRESSION:POST-PROCEDURE DX:  Stricture of the left mainstem bronchus due to granulation tissue in the left upper lobe suture line Status post cryoablation of tissue Status post endobronchial balloon dilation Tenacious secretions  RECOMMENDATION/PLAN:   Routine post bronchoscopy instructions Acapella flutter valve ordered for secretion clearance Patient has appropriate pulmonary/oncology appointments    C. Danice Goltz, MD Advanced Bronchoscopy PCCM [facility]-    *This note was generated using voice recognition software/Dragon and/or AI transcription program.  Despite best efforts to proofread, errors can occur which can change the meaning. Any transcriptional errors that result from this process are unintentional and may not be fully corrected at the time of dictation.

## 2023-04-30 NOTE — Interval H&P Note (Signed)
 Dominico L Sato has presented today for surgery, with the diagnosis of OBSTRUCTION OF LEFT MAINSTEM BRONCHUS DUE TO GRANULATION TISSUE.  The various methods of treatment have been discussed with the patient and family. After consideration of risks, benefits and other options for treatment, the patient has consented to  Procedure(s): BRONCHOSCOPY WITH SPRAY CRYOTHERAPY FOR DESTRUCTION OF GRANULATION TISSUE AND BALLOON DILATION FOR RELIEF OF OBSTRUCTION-LEFT as a surgical intervention.  The patient's history has been reviewed, patient examined, no change in status, stable for surgery.  I have reviewed the patient's chart and labs.  Questions were answered to the patient's satisfaction.  Benefits, limitations and potential complications of the procedure were discussed with the patient/family.  Complications from bronchoscopy are rare and most often minor, but if they occur they may include breathing difficulty, vocal cord spasm, hoarseness, slight fever, vomiting, dizziness, bronchospasm, infection, low blood oxygen, bleeding from biopsy site, or an allergic reaction to medications.  It is uncommon for patients to experience other more serious complications for example: Collapsed lung requiring chest tube placement, respiratory failure, heart attack and/or cardiac arrhythmia.  Patient agrees to proceed.  Gailen Shelter, MD Advanced Bronchoscopy PCCM [place] [facility]    *This note was generated using voice recognition software/Dragon and/or AI transcription program.  Despite best efforts to proofread, errors can occur which can change the meaning. Any transcriptional errors that result from this process are unintentional and may not be fully corrected at the time of dictation.

## 2023-05-01 ENCOUNTER — Encounter: Payer: Self-pay | Admitting: Pulmonary Disease

## 2023-05-04 NOTE — Anesthesia Postprocedure Evaluation (Signed)
 Anesthesia Post Note  Patient: [name] [name]  Procedure(s) Performed: BRONCHOSCOPY, FLEXIBLE (Left)  Patient location during evaluation: PACU Anesthesia Type: General Level of consciousness: awake and alert Pain management: pain level controlled Vital Signs Assessment: post-procedure vital signs reviewed and stable Respiratory status: spontaneous breathing, nonlabored ventilation, respiratory function stable and patient connected to nasal cannula oxygen Cardiovascular status: blood pressure returned to baseline and stable Postop Assessment: no apparent nausea or vomiting Anesthetic complications: no   No notable events documented.   Last Vitals:  Vitals:   04/30/23 1115 04/30/23 1156  BP: 123/73 120/72  Pulse: 61 62  Resp: 13 15  Temp:  (!) 36.4 C  SpO2: 97% 98%    Last Pain:  Vitals:   05/01/23 0827  TempSrc:   PainSc: 0-No pain                 Lenard Simmer

## 2023-05-06 ENCOUNTER — Encounter: Payer: Self-pay | Admitting: Pulmonary Disease

## 2023-05-07 ENCOUNTER — Ambulatory Visit: Attending: Pulmonary Disease

## 2023-05-07 DIAGNOSIS — J4489 Other specified chronic obstructive pulmonary disease: Secondary | ICD-10-CM

## 2023-05-07 DIAGNOSIS — R0602 Shortness of breath: Secondary | ICD-10-CM

## 2023-05-07 LAB — PULMONARY FUNCTION TEST ARMC ONLY
DL/VA % pred: 78 %
DL/VA: 3.16 ml/min/mmHg/L
DLCO unc % pred: 64 %
DLCO unc: 19.64 ml/min/mmHg
FEF 25-75 Post: 1.19 L/s
FEF 25-75 Pre: 1.08 L/s
FEF2575-%Change-Post: 9 %
FEF2575-%Pred-Post: 38 %
FEF2575-%Pred-Pre: 34 %
FEV1-%Change-Post: 1 %
FEV1-%Pred-Post: 60 %
FEV1-%Pred-Pre: 59 %
FEV1-Post: 2.45 L
FEV1-Pre: 2.4 L
FEV1FVC-%Change-Post: 0 %
FEV1FVC-%Pred-Pre: 76 %
FEV6-%Change-Post: 1 %
FEV6-%Pred-Post: 82 %
FEV6-%Pred-Pre: 81 %
FEV6-Post: 4.26 L
FEV6-Pre: 4.21 L
FEV6FVC-%Change-Post: 0 %
FEV6FVC-%Pred-Post: 103 %
FEV6FVC-%Pred-Pre: 103 %
FVC-%Change-Post: 1 %
FVC-%Pred-Post: 79 %
FVC-%Pred-Pre: 78 %
FVC-Post: 4.31 L
FVC-Pre: 4.26 L
Post FEV1/FVC ratio: 57 %
Post FEV6/FVC ratio: 99 %
Pre FEV1/FVC ratio: 56 %
Pre FEV6/FVC Ratio: 99 %
RV % pred: 124 %
RV: 3.3 L
TLC % pred: 93 %
TLC: 7.47 L

## 2023-05-07 MED ORDER — ALBUTEROL SULFATE (2.5 MG/3ML) 0.083% IN NEBU
2.5000 mg | INHALATION_SOLUTION | Freq: Once | RESPIRATORY_TRACT | Status: AC
  Administered 2023-05-07: 2.5 mg via RESPIRATORY_TRACT
  Filled 2023-05-07: qty 3

## 2023-05-10 ENCOUNTER — Ambulatory Visit: Admitting: Pulmonary Disease

## 2023-05-12 ENCOUNTER — Encounter: Payer: Self-pay | Admitting: Oncology

## 2023-05-15 ENCOUNTER — Encounter: Payer: Self-pay | Admitting: Pulmonary Disease

## 2023-05-15 ENCOUNTER — Ambulatory Visit: Admitting: Pulmonary Disease

## 2023-05-15 VITALS — BP 140/90 | HR 97 | Temp 97.9°F | Ht 75.0 in | Wt 274.2 lb

## 2023-05-15 DIAGNOSIS — C20 Malignant neoplasm of rectum: Secondary | ICD-10-CM | POA: Diagnosis not present

## 2023-05-15 DIAGNOSIS — J9809 Other diseases of bronchus, not elsewhere classified: Secondary | ICD-10-CM

## 2023-05-15 DIAGNOSIS — R0602 Shortness of breath: Secondary | ICD-10-CM

## 2023-05-15 DIAGNOSIS — J4489 Other specified chronic obstructive pulmonary disease: Secondary | ICD-10-CM | POA: Diagnosis not present

## 2023-05-15 NOTE — Patient Instructions (Addendum)
 VISIT SUMMARY:  You are a 68 year old male with a history of lung resection who came in for a follow-up on your pulmonary status. Your breathing has improved since your last visit, and you have been feeling better overall.  You underwent bronchoscopy for dilation of your lung stenosis on 7 April.  You felt better since then.  You were able to mow the lawn without significant issues despite the pollen. You continue to use Advair, and your lung function has remained stable since 2020. Your lung capacity is at 60-70%, and your diffusion capacity is mildly reduced, but the remaining lung areas have compensated well.  YOUR PLAN:  -REDUCED LUNG CAPACITY/ASTHMATIC BRONCHITIS: Your lung capacity is at 60-70% due to a previous lung resection, and there has been no significant change in your lung function since 2020. Your diffusion capacity is mildly reduced, but the remaining lung areas have compensated for the resected portion. Continue using Advair for long-term management of your lung function.  -BRONCHITIS: You recently had a severe episode of bronchitis, but your symptoms have improved. There are no current abnormal lung sounds, and a recent bronchoscopy showed mucus accumulation, which was cleared. Your breathing tests indicate no significant change from previous results, and the nebulizer treatment showed minimal effect. Monitor for recurrence of symptoms such as abnormal lung sounds, and follow up in six months unless symptoms recur.  INSTRUCTIONS:  Please follow up in six months unless you experience a recurrence of symptoms.

## 2023-05-15 NOTE — Progress Notes (Signed)
 Subjective:    Patient ID: [name] [name], male    DOB: [birth year], 68 y.o.   MRN: [id number]  Patient Care Team: Sowles, Krichna, MD as PCP - General (Family Medicine) Shellie Dials, MD as Consulting Physician (Oncology) Geraline Knapp, MD (Urology) Rosa College, MD as Consulting Physician (Ophthalmology) Sowles, Krichna, MD as Attending Physician (Family Medicine) Marc Senior, MD as Consulting Physician (Pulmonary Disease) Cardiovascular, [facility]  Chief Complaint  Patient presents with   Follow-up    Occasional cough, shortness of breath and wheezing.     BACKGROUND/INTERVAL:  HPI    Review of Systems A 10 point review of systems was performed and it is as noted above otherwise negative.   Patient Active Problem List   Diagnosis Date Noted   Chemotherapy-induced neuropathy (HCC) 11/27/2022   Asthmatic bronchitis , chronic (HCC) 11/27/2022   Bronchial stenosis, left 12/23/2021   Excessive granulation tissue    History of lobectomy of lung 04/07/2021   Metastatic cancer to lung (HCC) 08/11/2020   Essential hypertension 08/11/2020   Dyslipidemia 08/11/2020   Spasm of muscle of lower back 08/11/2020   Peyronie's disease 10/15/2019   Tobacco use 10/15/2019   Glaucoma of right eye 03/22/2018   Erectile dysfunction 03/22/2018   Decreased vision of right eye 09/21/2017   Colostomy in place [facility]) 09/21/2017   Hypogonadism in male 09/21/2017   Perennial allergic rhinitis with seasonal variation    Degeneration of lumbar intervertebral disc 06/26/2017   Rectal cancer (HCC) 08/05/2014    Social History   Tobacco Use   Smoking status: Former    Types: Cigars   Smokeless tobacco: Current    Types: Chew   Tobacco comments:    No Cigars. Does Dip. Khj 02/16/2023  Substance Use Topics   Alcohol use: Yes    Alcohol/week: 0.0 standard drinks of alcohol    Comment: RARE    Allergies  Allergen Reactions   Sulfa Antibiotics Hives   Morphine  And  Codeine Other (See Comments)    Altered mental status    Current Meds  Medication Sig   acetaminophen  (TYLENOL ) 500 MG tablet Take 1,000 mg by mouth every 6 (six) hours as needed for mild pain.   albuterol  (VENTOLIN  HFA) 108 (90 Base) MCG/ACT inhaler INHALE 2 PUFFS BY MOUTH INTO THE LUNGS EVERY 6 HOURS AS NEEDED FOR WHEEZING OR SHORTNESS OF BREATH   amLODipine  (NORVASC ) 10 MG tablet Take 1 tablet (10 mg total) by mouth daily.   calcium  carbonate (TUMS - DOSED IN MG ELEMENTAL CALCIUM ) 500 MG chewable tablet Chew 2 tablets by mouth as needed for indigestion or heartburn.   cetirizine (ZYRTEC) 10 MG tablet Take 10 mg by mouth at bedtime.    cyclobenzaprine  (FLEXERIL ) 10 MG tablet TAKE 1 TABLET BY MOUTH THREE TIMES A DAY AS NEEDED FOR MUSCLE SPASM   loperamide  (IMODIUM ) 2 MG capsule Take 4 mg by mouth as needed for diarrhea or loose stools.    Multiple Vitamin (MULTIVITAMIN WITH MINERALS) TABS tablet Take 1 tablet by mouth every evening.    WIXELA INHUB 250-50 MCG/ACT AEPB INHALE 1 PUFF INTO THE LUNGS IN THE MORNING AND AT BEDTIME.    Immunization History  Administered Date(s) Administered   Fluad Quad(high Dose 65+) 10/12/2021   Influenza, High Dose Seasonal PF 11/23/2022   Influenza,inj,Quad PF,6+ Mos 03/06/2017, 09/21/2017, 11/28/2018, 10/15/2019   Influenza-Unspecified 11/17/2020   PFIZER(Purple Top)SARS-COV-2 Vaccination 03/10/2019, 04/09/2019   PNEUMOCOCCAL CONJUGATE-20 10/12/2021   Pfizer(Comirnaty)Fall Seasonal Vaccine 12  years and older 11/23/2022   Respiratory Syncytial Virus Vaccine,Recomb Aduvanted(Arexvy) 02/21/2022   Tdap 02/21/2016   Zoster Recombinant(Shingrix ) 07/03/2016, 11/23/2022        Objective:     BP (!) 140/90 (BP Location: Left Arm, Patient Position: Sitting, Cuff Size: Normal)   Pulse 97   Temp 97.9 F (36.6 C) (Temporal)   Ht 6\' 3"  (1.905 m)   Wt 274 lb 3.2 oz (124.4 kg)   SpO2 97%   BMI 34.27 kg/m   SpO2: 97 %  GENERAL: HEAD: Normocephalic,  atraumatic.  EYES: Pupils equal, round, reactive to light.  No scleral icterus.  MOUTH:  NECK: Supple. No thyromegaly. Trachea midline. No JVD.  No adenopathy. PULMONARY: Good air entry bilaterally.  No adventitious sounds. CARDIOVASCULAR: S1 and S2. Regular rate and rhythm.  ABDOMEN: MUSCULOSKELETAL: No joint deformity, no clubbing, no edema.  NEUROLOGIC:  SKIN: Intact,warm,dry. PSYCH:        Assessment & Plan:   No diagnosis found.  No orders of the defined types were placed in this encounter.   No orders of the defined types were placed in this encounter.      Advised if symptoms do not improve or worsen, to please contact office for sooner follow up or seek emergency care.    I spent xxx minutes of dedicated to the care of this patient on the date of this encounter to include pre-visit review of records, face-to-face time with the patient discussing conditions above, post visit ordering of testing, clinical documentation with the electronic health record, making appropriate referrals as documented, and communicating necessary findings to members of the patients care team.     C. Chloe Counter, MD Advanced Bronchoscopy PCCM [place]    *This note was generated using voice recognition software/Dragon and/or AI transcription program.  Despite best efforts to proofread, errors can occur which can change the meaning. Any transcriptional errors that result from this process are unintentional and may not be fully corrected at the time of dictation.

## 2023-05-16 ENCOUNTER — Encounter: Payer: Self-pay | Admitting: Pulmonary Disease

## 2023-05-16 NOTE — Telephone Encounter (Signed)
Closing encounter.  Nothing further needed.

## 2023-05-21 DIAGNOSIS — Z933 Colostomy status: Secondary | ICD-10-CM | POA: Diagnosis not present

## 2023-06-04 ENCOUNTER — Encounter: Payer: Self-pay | Admitting: Pulmonary Disease

## 2023-06-04 NOTE — Telephone Encounter (Signed)
 How long have your symptoms been going on for? 3-4 days Any fevers, chills or sweats? No Any cough? If so are you getting anything up? What color? Cough no color to sputum.  Any SOB? No Any wheezing? Mild Do you monitor your oxygen at home? No Do you wear O2? No  What medications do you take? Wixela, Ventolin , and Mucinex  extra strength at night.  Covid test? Tested yesterday. Negative.  Pharmacy [facility] [place], [place]

## 2023-06-05 ENCOUNTER — Inpatient Hospital Stay

## 2023-06-05 MED ORDER — METHYLPREDNISOLONE 4 MG PO TBPK
ORAL_TABLET | ORAL | 0 refills | Status: DC
Start: 2023-06-05 — End: 2023-08-23

## 2023-06-05 MED ORDER — AZITHROMYCIN 500 MG PO TABS: 500.0000 mg | ORAL_TABLET | Freq: Every day | ORAL | 0 refills | Status: AC

## 2023-06-05 NOTE — Telephone Encounter (Signed)
 I sent in prescriptions to his pharmacy for azithromycin  and a Medrol  Dosepak.  I recommend that he switch to Mucinex  DM extra strength and take twice a day.  Call back if symptoms worsen or fail to improve.

## 2023-06-11 DIAGNOSIS — Z933 Colostomy status: Secondary | ICD-10-CM | POA: Diagnosis not present

## 2023-07-05 ENCOUNTER — Other Ambulatory Visit: Payer: Self-pay | Admitting: Family Medicine

## 2023-07-05 DIAGNOSIS — M62830 Muscle spasm of back: Secondary | ICD-10-CM

## 2023-07-17 ENCOUNTER — Inpatient Hospital Stay

## 2023-08-01 DIAGNOSIS — Z933 Colostomy status: Secondary | ICD-10-CM | POA: Diagnosis not present

## 2023-08-02 NOTE — Telephone Encounter (Unsigned)
 Copied from CRM [id number]. Topic: Appointments - Appointment Cancel/Reschedule >> Aug 02, 2023 11:48 AM Mia F wrote: Patient/patient representative is calling to cancel or reschedule an appointment. Refer to attachments for appointment information.

## 2023-08-23 ENCOUNTER — Ambulatory Visit: Payer: HMO

## 2023-08-23 ENCOUNTER — Encounter: Payer: Self-pay | Admitting: Family Medicine

## 2023-08-23 DIAGNOSIS — Z0000 Encounter for general adult medical examination without abnormal findings: Secondary | ICD-10-CM | POA: Diagnosis not present

## 2023-08-23 NOTE — Patient Instructions (Addendum)
 [name] [name] , Thank you for taking time out of your busy schedule to complete your Annual Wellness Visit with me. I enjoyed our conversation and look forward to speaking with you again next year. I, as well as your care team,  appreciate your ongoing commitment to your health goals. Please review the following plan we discussed and let me know if I can assist you in the future.  Follow up Visits: 08/28/24 @ 10:50 am by phone We will see or speak with you next year for your Next Medicare AWV with our clinical staff Have you seen your provider in the last 6 months (3 months if uncontrolled diabetes)? Yes  Clinician Recommendations:  Aim for 30 minutes of exercise or brisk walking, 6-8 glasses of water, and 5 servings of fruits and vegetables each day. TAKE CARE!      This is a list of the screenings recommended for you:  Health Maintenance  Topic Date Due   Colon Cancer Screening  Never done   COVID-19 Vaccine (4 - Pfizer risk 2024-25 season) 05/23/2023   Flu Shot  08/24/2023   Medicare Annual Wellness Visit  08/22/2024   DTaP/Tdap/Td vaccine (2 - Td or Tdap) 02/20/2026   Pneumococcal Vaccine for age over 9  Completed   Hepatitis C Screening  Completed   Zoster (Shingles) Vaccine  Completed   Hepatitis B Vaccine  Aged Out   HPV Vaccine  Aged Out   Meningitis B Vaccine  Aged Out    Advanced directives: (ACP Link)Information on Advanced Care Planning can be found at [employer] Advance Health Care Directives Advance Health Care Directives. [email or web address]  Advance Care Planning is important because it:  [x]  Makes sure you receive the medical care that is consistent with your values, goals, and preferences  [x]  It provides guidance to your family and loved ones and reduces their decisional burden about whether or not they are making the right decisions based on your wishes.  Follow the link provided in your after visit summary or read over the paperwork we have mailed to you  to help you started getting your Advance Directives in place. If you need assistance in completing these, please reach out to us  so that we can help you!

## 2023-08-23 NOTE — Progress Notes (Signed)
 Subjective:   [name] [name] is a 68 y.o. who presents for a Medicare Wellness preventive visit.  As a reminder, Annual Wellness Visits don't include a physical exam, and some assessments may be limited, especially if this visit is performed virtually. We may recommend an in-person follow-up visit with your provider if needed.  Visit Complete: Virtual I connected with  [name] [name] on 08/23/23 by a audio enabled telemedicine application and verified that I am speaking with the correct person using two identifiers.  Patient Location: Home  Provider Location: Home Office  I discussed the limitations of evaluation and management by telemedicine. The patient expressed understanding and agreed to proceed.  Vital Signs: Because this visit was a virtual/telehealth visit, some criteria may be missing or patient reported. Any vitals not documented were not able to be obtained and vitals that have been documented are patient reported.  VideoDeclined- This patient declined Librarian, academic. Therefore the visit was completed with audio only.  Persons Participating in Visit: Patient.  AWV Questionnaire: No: Patient Medicare AWV questionnaire was not completed prior to this visit.  Cardiac Risk Factors include: advanced age (>30men, >74 women);dyslipidemia;male gender;hypertension;sedentary lifestyle;obesity (BMI >30kg/m2)     Objective:    There were no vitals filed for this visit. There is no height or weight on file to calculate BMI.     08/23/2023   10:59 AM 04/30/2023    8:47 AM 04/24/2023   12:41 PM 08/17/2022   10:38 AM 08/02/2022    9:11 AM 04/06/2022    2:21 PM 12/23/2021   11:10 AM  Advanced Directives  Does Patient Have a Medical Advance Directive? No No No No No No No  Would patient like information on creating a medical advance directive? No - Patient declined No - Patient declined  No - Patient declined  No - Patient declined No - Patient declined     Current Medications (verified) Outpatient Encounter Medications as of 08/23/2023  Medication Sig   acetaminophen  (TYLENOL ) 500 MG tablet Take 1,000 mg by mouth every 6 (six) hours as needed for mild pain.   albuterol  (VENTOLIN  HFA) 108 (90 Base) MCG/ACT inhaler INHALE 2 PUFFS BY MOUTH INTO THE LUNGS EVERY 6 HOURS AS NEEDED FOR WHEEZING OR SHORTNESS OF BREATH   amLODipine  (NORVASC ) 10 MG tablet Take 1 tablet (10 mg total) by mouth daily.   calcium  carbonate (TUMS - DOSED IN MG ELEMENTAL CALCIUM ) 500 MG chewable tablet Chew 2 tablets by mouth as needed for indigestion or heartburn.   cetirizine (ZYRTEC) 10 MG tablet Take 10 mg by mouth at bedtime.    cyclobenzaprine  (FLEXERIL ) 10 MG tablet TAKE 1 TABLET BY MOUTH THREE TIMES A DAY AS NEEDED FOR MUSCLE SPASM   loperamide  (IMODIUM ) 2 MG capsule Take 4 mg by mouth as needed for diarrhea or loose stools.    tadalafil  (CIALIS ) 20 MG tablet TAKE 1 TAB BY MOUTH 1 HOUR PRIOR TO INTERCOURSE AS NEEDED   WIXELA INHUB 250-50 MCG/ACT AEPB INHALE 1 PUFF INTO THE LUNGS IN THE MORNING AND AT BEDTIME.   Multiple Vitamin (MULTIVITAMIN WITH MINERALS) TABS tablet Take 1 tablet by mouth every evening.  (Patient not taking: Reported on 08/23/2023)   [DISCONTINUED] methylPREDNISolone  (MEDROL  DOSEPAK) 4 MG TBPK tablet Take as directed on the package, this is a taper pack.   Facility-Administered Encounter Medications as of 08/23/2023  Medication   heparin  lock flush 100 unit/mL   sodium chloride  flush (NS) 0.9 % injection 10 mL  Allergies (verified) Sulfa antibiotics and Morphine  and codeine   History: Past Medical History:  Diagnosis Date   Anemia    Arthritis    Asthmatic bronchitis , chronic (HCC) 2025   Bronchial obstruction 07/2022   Bronchial stenosis, left 06/2022   Bronchial stenosis, left 2025   Cancer (HCC) 2015   rectal, followed by Oncology, permanent colostomy   Cancer of upper lobe of left lung (HCC) 01/2019   Colostomy in place [facility])     Dyslipidemia    Dyspnea    on exertion   History of kidney stones    Hypertension    Mass of upper lobe of left lung 01/2019   Neuromuscular disorder (HCC)    Pneumonia    Rectal cancer (HCC) 2015   Seasonal allergies    Past Surgical History:  Procedure Laterality Date   CHOLECYSTECTOMY     COLON SURGERY     has a permanment ostomy   ENDOBRONCHIAL ULTRASOUND N/A 07/01/2018   Procedure: ENDOBRONCHIAL ULTRASOUND;  Surgeon: Tamea Dedra CROME, MD;  Location: [facility] ORS;  Service: Cardiopulmonary;  Laterality: N/A;   FLEXIBLE BRONCHOSCOPY Left 11/20/2018   Procedure: FLEXIBLE BRONCHOSCOPY;  Surgeon: Tamea Dedra CROME, MD;  Location: [facility];  Service: Cardiopulmonary;  Laterality: Left;   FLEXIBLE BRONCHOSCOPY Left 12/23/2021   Procedure: FLEXIBLE BRONCHOSCOPY;  Surgeon: Tamea Dedra CROME, MD;  Location: [facility];  Service: Pulmonary;  Laterality: Left;   FLEXIBLE BRONCHOSCOPY Left 08/09/2022   Procedure: FLEXIBLE BRONCHOSCOPY;  Surgeon: Tamea Dedra CROME, MD;  Location: [facility];  Service: Pulmonary;  Laterality: Left;   FLEXIBLE BRONCHOSCOPY Left 04/30/2023   Procedure: BRONCHOSCOPY, FLEXIBLE;  Surgeon: Tamea Dedra CROME, MD;  Location: [facility];  Service: Pulmonary;  Laterality: Left;   INTERCOSTAL NERVE BLOCK Left 02/03/2019   Procedure: INTERCOSTAL NERVE BLOCK WITH EXPAREL ;  Surgeon: Army Dallas NOVAK, MD;  Location: [facility];  Service: Thoracic;  Laterality: Left;   KNEE ARTHROSCOPY Right    KNEE SURGERY Left    torn ligaments (staple in place)   laceration of wrist Right    LOBECTOMY Left 02/03/2019   Procedure: LEFT UPPER LOBECTOMY WITH LYMPH NODE DISSECTION;  Surgeon: Army Dallas NOVAK, MD;  Location: [facility] OR;  Service: Thoracic;  Laterality: Left;   LUMBAR DISC SURGERY     ruptured disc   PORT A CATH REVISION Left 08/05/2018   Procedure: PORT A CATH REVISION, REPOSITION LEFT;  Surgeon: Dessa Reyes ORN, MD;  Location: [facility];  Service: General;  Laterality: Left;    PORT-A-CATH REMOVAL Right 12/02/2014   Procedure: REMOVAL PORT-A-CATH;  Surgeon: Lonni Brands, MD;  Location: [facility];  Service: General;  Laterality: Right;   PORTACATH PLACEMENT     PORTACATH PLACEMENT Left 07/29/2018   Procedure: INSERTION PORT-A-CATH LEFT;  Surgeon: Dessa Reyes ORN, MD;  Location: [facility];  Service: General;  Laterality: Left;   RECTAL BIOPSY  04/30/2013   RIGID BRONCHOSCOPY Left 08/09/2022   Procedure: RIGID BRONCHOSCOPY;  Surgeon: Tamea Dedra CROME, MD;  Location: [facility];  Service: Pulmonary;  Laterality: Left;   THORACOTOMY/LOBECTOMY Left 02/03/2019   Procedure: MINITHORACOTOMY;  Surgeon: Army Dallas NOVAK, MD;  Location: [facility] OR;  Service: Thoracic;  Laterality: Left;   VIDEO ASSISTED THORACOSCOPY (VATS)/WEDGE RESECTION Left 02/03/2019   Procedure: VIDEO ASSISTED THORACOSCOPY (VATS)/LUNG RESECTION;  Surgeon: Army Dallas NOVAK, MD;  Location: [facility] OR;  Service: Thoracic;  Laterality: Left;   VIDEO BRONCHOSCOPY N/A 02/03/2019   Procedure: VIDEO BRONCHOSCOPY;  Surgeon: Army Dallas NOVAK, MD;  [facility];  Service: Thoracic;  Laterality: N/A;   VIDEO BRONCHOSCOPY N/A 10/28/2021   Procedure: VIDEO BRONCHOSCOPY WITH CRYO and DEBULKING;  Surgeon: Tamea Dedra CROME, MD;  Location: [facility];  Service: Cardiopulmonary;  Laterality: N/A;   VIDEO BRONCHOSCOPY WITH ENDOBRONCHIAL ULTRASOUND N/A 12/30/2018   Procedure: VIDEO BRONCHOSCOPY WITH ENDOBRONCHIAL ULTRASOUND;  Surgeon: Army Dallas NOVAK, MD;  Location: [facility] OR;  Service: Thoracic;  Laterality: N/A;   XI ROBOT ABDOMINAL PERINEAL RESECTION  10/09/2013   Family History  Problem Relation Age of Onset   Cancer Mother    Hypertension Mother    COPD Mother    COPD Father    Cancer Father        Prostate CA, on Lupron   Stroke Maternal Grandmother    Cancer Paternal Grandmother    Heart attack Paternal Grandfather    Cancer Paternal Grandfather    Social History   Socioeconomic History   Marital  status: Married    Spouse name: Alfonse   Number of children: 1   Years of education: Not on file   Highest education level: Some college, no degree  Occupational History   Not on file  Tobacco Use   Smoking status: Former    Types: Cigars   Smokeless tobacco: Current    Types: Chew   Tobacco comments:    No Cigars. Does Dip. Khj 02/16/2023  Vaping Use   Vaping status: Never Used  Substance and Sexual Activity   Alcohol use: Yes    Alcohol/week: 0.0 standard drinks of alcohol    Comment: RARE   Drug use: No   Sexual activity: Yes    Partners: Female  Other Topics Concern   Not on file  Social History Narrative   Not on file   Social Drivers of Health   Financial Resource Strain: Low Risk  (08/23/2023)   Overall Financial Resource Strain (CARDIA)    Difficulty of Paying Living Expenses: Not hard at all  Food Insecurity: No Food Insecurity (08/23/2023)   Hunger Vital Sign    Worried About Running Out of Food in the Last Year: Never true    Ran Out of Food in the Last Year: Never true  Transportation Needs: No Transportation Needs (08/23/2023)   PRAPARE - Administrator, Civil Service (Medical): No    Lack of Transportation (Non-Medical): No  Physical Activity: Insufficiently Active (08/23/2023)   Exercise Vital Sign    Days of Exercise per Week: 3 days    Minutes of Exercise per Session: 20 min  Stress: No Stress Concern Present (08/23/2023)   [employer] of Occupational Health - Occupational Stress Questionnaire    Feeling of Stress: Not at all  Social Connections: Moderately Integrated (08/23/2023)   Social Connection and Isolation Panel    Frequency of Communication with Friends and Family: More than three times a week    Frequency of Social Gatherings with Friends and Family: More than three times a week    Attends Religious Services: Patient declined    Database administrator or Organizations: Yes    Attends Engineer, structural: More than 4  times per year    Marital Status: Married    Tobacco Counseling Ready to quit: Not Answered Counseling given: Not Answered Tobacco comments: No Cigars. Does Dip. Khj 02/16/2023    Clinical Intake:  Pre-visit preparation completed: Yes  Pain : No/denies pain     BMI - recorded: 34.2 Nutritional Status: BMI > 30  Obese Nutritional Risks: None Diabetes: No  No results found for: HGBA1C   How often do you need to have someone help you when you read instructions, pamphlets, or other written materials from your doctor or pharmacy?: 1 - Never  Interpreter Needed?: No  Information entered by :: JHONNIE DAS, LPN   Activities of Daily Living    08/23/2023   11:01 AM 08/19/2023   10:55 AM  In your present state of health, do you have any difficulty performing the following activities:  Hearing? 0 0  Vision? 0 0  Difficulty concentrating or making decisions? 0 0  Walking or climbing stairs? 0 0  Dressing or bathing? 0 0  Doing errands, shopping? 0 0  Preparing Food and eating ? N N  Using the Toilet? N N  In the past six months, have you accidently leaked urine? N N  Do you have problems with loss of bowel control? N N  Managing your Medications? N N  Managing your Finances? N N  Housekeeping or managing your Housekeeping? N N    Patient Care Team: Sowles, Krichna, MD as PCP - General (Family Medicine) Jacobo Evalene PARAS, MD as Consulting Physician (Oncology) Twylla Glendia BROCKS, MD (Urology) Myrna Adine Anes, MD as Consulting Physician (Ophthalmology) Sowles, Krichna, MD as Attending Physician (Family Medicine) Tamea Dedra CROME, MD as Consulting Physician (Pulmonary Disease) Cardiovascular, [facility] [place], [place], [name]  I have updated your Care Teams any recent Medical Services you may have received from other providers in the past year.     Assessment:   This is a routine wellness examination for [name] [name].  Hearing/Vision screen Hearing  Screening - Comments:: NO AIDS Vision Screening - Comments:: WEARS GLASSES ALL DAY- [facility] IN [place]- NEEDS APPT   Goals Addressed             This Visit's Progress    DIET - EAT MORE FRUITS AND VEGETABLES         Depression Screen     08/23/2023   10:58 AM 11/27/2022    8:23 AM 09/27/2022    8:08 AM 08/24/2022   10:43 AM 08/17/2022   10:36 AM 03/17/2022    2:23 PM 10/12/2021    9:52 AM  PHQ 2/9 Scores  PHQ - 2 Score 0 0 0 0 0 0 0  PHQ- 9 Score 0 0 0 0  0 0    Fall Risk     08/19/2023   10:55 AM 11/27/2022    8:23 AM 09/27/2022    8:08 AM 08/24/2022   10:43 AM 08/17/2022   10:37 AM  Fall Risk   Falls in the past year? 0 1 1 1 1   Number falls in past yr: 0 0 0 0 0  Injury with Fall? 0 0 0 0 0  Risk for fall due to : No Fall Risks No Fall Risks History of fall(s) History of fall(s) No Fall Risks  Follow up Falls evaluation completed;Falls prevention discussed Falls prevention discussed Falls prevention discussed;Education provided Falls evaluation completed;Education provided;Falls prevention discussed Falls prevention discussed    MEDICARE RISK AT HOME:  Medicare Risk at Home Any stairs in or around the home?: Yes If so, are there any without handrails?: Yes Home free of loose throw rugs in walkways, pet beds, electrical cords, etc?: Yes Adequate lighting in your home to reduce risk of falls?: Yes Life alert?: No Use of a cane, walker or w/c?: No Grab bars in the bathroom?:  Yes Shower chair or bench in shower?: No Elevated toilet seat or a handicapped toilet?: No  TIMED UP AND GO:  Was the test performed?  No  Cognitive Function: 6CIT completed        08/23/2023   11:02 AM 08/17/2022   10:38 AM  6CIT Screen  What Year? 0 points 0 points  What month? 0 points 0 points  What time? 0 points 0 points  Count back from 20 0 points 0 points  Months in reverse 0 points 0 points  Repeat phrase 0 points 0 points  Total Score 0 points 0 points     Immunizations Immunization History  Administered Date(s) Administered   Fluad Quad(high Dose 65+) 10/12/2021   Influenza, High Dose Seasonal PF 11/23/2022   Influenza,inj,Quad PF,6+ Mos 03/06/2017, 09/21/2017, 11/28/2018, 10/15/2019   Influenza-Unspecified 11/17/2020   PFIZER(Purple Top)SARS-COV-2 Vaccination 03/10/2019, 04/09/2019   PNEUMOCOCCAL CONJUGATE-20 10/12/2021   Pfizer(Comirnaty)Fall Seasonal Vaccine 12 years and older 11/23/2022   Respiratory Syncytial Virus Vaccine,Recomb Aduvanted(Arexvy) 02/21/2022   Tdap 02/21/2016   Zoster Recombinant(Shingrix ) 07/03/2016, 11/23/2022    Screening Tests Health Maintenance  Topic Date Due   Colonoscopy  Never done   COVID-19 Vaccine (4 - Pfizer risk 2024-25 season) 05/23/2023   INFLUENZA VACCINE  08/24/2023   Medicare Annual Wellness (AWV)  08/22/2024   DTaP/Tdap/Td (2 - Td or Tdap) 02/20/2026   Pneumococcal Vaccine: 50+ Years  Completed   Hepatitis C Screening  Completed   Zoster Vaccines- Shingrix   Completed   Hepatitis B Vaccines  Aged Out   HPV VACCINES  Aged Out   Meningococcal B Vaccine  Aged Out    Health Maintenance  Health Maintenance Due  Topic Date Due   Colonoscopy  Never done   COVID-19 Vaccine (4 - Pfizer risk 2024-25 season) 05/23/2023   Health Maintenance Items Addressed: HAS COLOSTOMY BAG- DOESN'T GET COLONOSCOPY; UP TO DATE ON SHOTS  Additional Screening:  Vision Screening: Recommended annual ophthalmology exams for early detection of glaucoma and other disorders of the eye. Would you like a referral to an eye doctor? No    Dental Screening: Recommended annual dental exams for proper oral hygiene  Community Resource Referral / Chronic Care Management: CRR required this visit?  No   CCM required this visit?  No   Plan:    I have personally reviewed and noted the following in the patient's chart:   Medical and social history Use of alcohol, tobacco or illicit drugs  Current medications  and supplements including opioid prescriptions. Patient is not currently taking opioid prescriptions. Functional ability and status Nutritional status Physical activity Advanced directives List of other physicians Hospitalizations, surgeries, and ER visits in previous 12 months Vitals Screenings to include cognitive, depression, and falls Referrals and appointments  In addition, I have reviewed and discussed with patient certain preventive protocols, quality metrics, and best practice recommendations. A written personalized care plan for preventive services as well as general preventive health recommendations were provided to patient.   Jhonnie GORMAN Das, LPN   [id number]/68/7974   After Visit Summary: (MyChart) Due to this being a telephonic visit, the after visit summary with patients personalized plan was offered to patient via MyChart   Notes: Nothing significant to report at this time.

## 2023-08-28 ENCOUNTER — Encounter: Payer: Self-pay | Admitting: Family Medicine

## 2023-08-28 ENCOUNTER — Inpatient Hospital Stay

## 2023-09-04 ENCOUNTER — Encounter: Admitting: Family Medicine

## 2023-09-07 ENCOUNTER — Other Ambulatory Visit: Payer: Self-pay | Admitting: Pulmonary Disease

## 2023-09-19 DIAGNOSIS — Z933 Colostomy status: Secondary | ICD-10-CM | POA: Diagnosis not present

## 2023-10-01 NOTE — Patient Instructions (Signed)
 Preventive Care 73 Years and Older, Male Preventive care refers to lifestyle choices and visits with your health care provider that can promote health and wellness. Preventive care visits are also called wellness exams. What can I expect for my preventive care visit? Counseling During your preventive care visit, your health care provider may ask about your: Medical history, including: Past medical problems. Family medical history. History of falls. Current health, including: Emotional well-being. Home life and relationship well-being. Sexual activity. Memory and ability to understand (cognition). Lifestyle, including: Alcohol, nicotine or tobacco, and drug use. Access to firearms. Diet, exercise, and sleep habits. Work and work Astronomer. Sunscreen use. Safety issues such as seatbelt and bike helmet use. Physical exam Your health care provider will check your: Height and weight. These may be used to calculate your BMI (body mass index). BMI is a measurement that tells if you are at a healthy weight. Waist circumference. This measures the distance around your waistline. This measurement also tells if you are at a healthy weight and may help predict your risk of certain diseases, such as type 2 diabetes and high blood pressure. Heart rate and blood pressure. Body temperature. Skin for abnormal spots. What immunizations do I need?  Vaccines are usually given at various ages, according to a schedule. Your health care provider will recommend vaccines for you based on your age, medical history, and lifestyle or other factors, such as travel or where you work. What tests do I need? Screening Your health care provider may recommend screening tests for certain conditions. This may include: Lipid and cholesterol levels. Diabetes screening. This is done by checking your blood sugar (glucose) after you have not eaten for a while (fasting). Hepatitis C test. Hepatitis B test. HIV (human  immunodeficiency virus) test. STI (sexually transmitted infection) testing, if you are at risk. Lung cancer screening. Colorectal cancer screening. Prostate cancer screening. Abdominal aortic aneurysm (AAA) screening. You may need this if you are a current or former smoker. Talk with your health care provider about your test results, treatment options, and if necessary, the need for more tests. Follow these instructions at home: Eating and drinking  Eat a diet that includes fresh fruits and vegetables, whole grains, lean protein, and low-fat dairy products. Limit your intake of foods with high amounts of sugar, saturated fats, and salt. Take vitamin and mineral supplements as recommended by your health care provider. Do not drink alcohol if your health care provider tells you not to drink. If you drink alcohol: Limit how much you have to 0-2 drinks a day. Know how much alcohol is in your drink. In the [place] one drink equals one 12 oz bottle of beer (355 mL), one 5 oz glass of wine (148 mL), or one 1 oz glass of hard liquor (44 mL). Lifestyle Brush your teeth every morning and night with fluoride toothpaste. Floss one time each day. Exercise for at least 30 minutes 5 or more days each week. Do not use any products that contain nicotine or tobacco. These products include cigarettes, chewing tobacco, and vaping devices, such as e-cigarettes. If you need help quitting, ask your health care provider. Do not use drugs. If you are sexually active, practice safe sex. Use a condom or other form of protection to prevent STIs. Take aspirin only as told by your health care provider. Make sure that you understand how much to take and what form to take. Work with your health care provider to find out whether it is safe  and beneficial for you to take aspirin daily. Ask your health care provider if you need to take a cholesterol-lowering medicine (statin). Find healthy ways to manage stress, such  as: Meditation, yoga, or listening to music. Journaling. Talking to a trusted person. Spending time with friends and family. Safety Always wear your seat belt while driving or riding in a vehicle. Do not drive: If you have been drinking alcohol. Do not ride with someone who has been drinking. When you are tired or distracted. While texting. If you have been using any mind-altering substances or drugs. Wear a helmet and other protective equipment during sports activities. If you have firearms in your house, make sure you follow all gun safety procedures. Minimize exposure to UV radiation to reduce your risk of skin cancer. What's next? Visit your health care provider once a year for an annual wellness visit. Ask your health care provider how often you should have your eyes and teeth checked. Stay up to date on all vaccines. This information is not intended to replace advice given to you by your health care provider. Make sure you discuss any questions you have with your health care provider. Document Revised: 07/07/2020 Document Reviewed: 07/07/2020 Elsevier Patient Education  2024 [employer].

## 2023-10-02 ENCOUNTER — Ambulatory Visit (INDEPENDENT_AMBULATORY_CARE_PROVIDER_SITE_OTHER): Admitting: Family Medicine

## 2023-10-02 ENCOUNTER — Encounter: Payer: Self-pay | Admitting: Family Medicine

## 2023-10-02 VITALS — BP 136/80 | HR 98 | Resp 16 | Ht 74.5 in | Wt 277.6 lb

## 2023-10-02 DIAGNOSIS — R739 Hyperglycemia, unspecified: Secondary | ICD-10-CM

## 2023-10-02 DIAGNOSIS — Z933 Colostomy status: Secondary | ICD-10-CM

## 2023-10-02 DIAGNOSIS — I700 Atherosclerosis of aorta: Secondary | ICD-10-CM | POA: Diagnosis not present

## 2023-10-02 DIAGNOSIS — G620 Drug-induced polyneuropathy: Secondary | ICD-10-CM | POA: Diagnosis not present

## 2023-10-02 DIAGNOSIS — J4489 Other specified chronic obstructive pulmonary disease: Secondary | ICD-10-CM

## 2023-10-02 DIAGNOSIS — M62830 Muscle spasm of back: Secondary | ICD-10-CM | POA: Diagnosis not present

## 2023-10-02 DIAGNOSIS — Z0001 Encounter for general adult medical examination with abnormal findings: Secondary | ICD-10-CM | POA: Diagnosis not present

## 2023-10-02 DIAGNOSIS — Z902 Acquired absence of lung [part of]: Secondary | ICD-10-CM

## 2023-10-02 DIAGNOSIS — Z0000 Encounter for general adult medical examination without abnormal findings: Secondary | ICD-10-CM

## 2023-10-02 DIAGNOSIS — I10 Essential (primary) hypertension: Secondary | ICD-10-CM

## 2023-10-02 DIAGNOSIS — E785 Hyperlipidemia, unspecified: Secondary | ICD-10-CM

## 2023-10-02 DIAGNOSIS — T451X5A Adverse effect of antineoplastic and immunosuppressive drugs, initial encounter: Secondary | ICD-10-CM

## 2023-10-02 MED ORDER — AMLODIPINE BESYLATE 10 MG PO TABS: 10.0000 mg | ORAL_TABLET | Freq: Every day | ORAL | 3 refills | Status: AC

## 2023-10-02 MED ORDER — CYCLOBENZAPRINE HCL 10 MG PO TABS: ORAL_TABLET | ORAL | 3 refills | Status: AC

## 2023-10-02 NOTE — Progress Notes (Signed)
 Name: [name] [name]   MRN: [id number]    DOB: 1955-12-25   Date:10/02/2023       Progress Note  Subjective  Chief Complaint  Chief Complaint  Patient presents with   Annual Exam    Pt refused male exam    HPI  Patient presents for annual CPE and follow up  Discussed the use of AI scribe software for clinical note transcription with the patient, who gave verbal consent to proceed.  History of Present Illness [name] [name] is a 68 year old male with asthmatic bronchitis and rectal adenocarcinoma who presents for a follow-up visit.  His breathing has improved since changing his secondary inhaler to [facility], used once in the morning and once at night. No further episodes of bronchitis have occurred since starting this medication. Shortness of breath is less frequent, primarily triggered by physical exertion such as lifting heavy objects or walking uphill. Rarely uses albuterol  inhaler unless doing yard work in the heat.  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 Underwent balloon dilation and cryotherapy for the granulation tissue in October 2023, December 2023, and July 2024. Last CT scan in March 2025 showed no evidence of recurrent progressive disease, with follow-up every six months with imaging.  Experiences chemotherapy-induced neuropathy, described as a burning sensation and altered sensation in his feet, particularly when walking on uneven ground or stepping on small objects. Does not take medication for this due to side effects from gabapentin , which caused diarrhea, complicating colostomy management.  Has a history of recurrent stage four rectal adenocarcinoma, with no current active disease. Underwent a colectomy and has a colostomy in the left lower quadrant. He is five years post-surgery and continues regular monitoring with scans.  Reports muscle spasms in his lower  back, particularly in the morning or after physical activity. Takes cyclobenzaprine  at night to manage these spasms.  Has a history of atherosclerosis and is prescribed amlodipine  for blood pressure management. Does not take cholesterol medication.  Notes mild lower urinary tract symptoms, including occasional nocturia and intermittent flow, but these are not bothersome. Has a history of elevated PSA levels, attributed to testosterone  use, which he has since discontinued.  Maintains a diet that includes red meat, fish, and vegetables, and avoids sodas and coffee. Engages in physical activity by caring for his grandchildren daily.      IPSS     Row Name 10/02/23 1036         International Prostate Symptom Score   How often have you had the sensation of not emptying your bladder? Not at All     How often have you had to urinate less than every two hours? Not at All     How often have you found you stopped and started again several times when you urinated? Less than 1 in 5 times     How often have you found it difficult to postpone urination? Not at All     How often have you had a weak urinary stream? Not at All     How often have you had to strain to start urination? Not at All     How many times did you typically get up at night to urinate? 2 Times     Total IPSS Score 3       Quality of Life due to urinary symptoms   If you were to spend the  rest of your life with your urinary condition just the way it is now how would you feel about that? Pleased        Diet: he likes red meat, they cook at home but also likes to eat out twice a week  Exercise: watching his grandchildren daily - every afternoon  Last Dental Exam: not seeing dentist in a while  Last Eye Exam: up to date  Depression: phq 9 is negative    10/02/2023   10:32 AM 08/23/2023   10:58 AM 11/27/2022    8:23 AM 09/27/2022    8:08 AM 08/24/2022   10:43 AM  Depression screen PHQ 2/9  Decreased Interest 0 0 0 0 0  Down,  Depressed, Hopeless 0 0 0 0 0  PHQ - 2 Score 0 0 0 0 0  Altered sleeping 0 0 0 0 0  Tired, decreased energy 0 0 0 0 0  Change in appetite 0 0 0 0 0  Feeling bad or failure about yourself  0 0 0 0 0  Trouble concentrating 0 0 0 0 0  Moving slowly or fidgety/restless 0 0 0 0 0  Suicidal thoughts 0 0 0 0 0  PHQ-9 Score 0 0 0 0 0  Difficult doing work/chores Not difficult at all Not difficult at all       Hypertension:  BP Readings from Last 3 Encounters:  10/02/23 136/80  05/15/23 (!) 140/90  04/30/23 120/72    Obesity: Wt Readings from Last 3 Encounters:  10/02/23 277 lb 9.6 oz (125.9 kg)  05/15/23 274 lb 3.2 oz (124.4 kg)  04/18/23 272 lb 12.8 oz (123.7 kg)   BMI Readings from Last 3 Encounters:  10/02/23 35.16 kg/m  05/15/23 34.27 kg/m  04/18/23 34.10 kg/m     Flowsheet Row Clinical Support from 08/23/2023 in [facility]  AUDIT-C Score 1     Married STD testing and prevention (HIV/chl/gon/syphilis):  not applicable Sexual history: no problems Hep C Screening: completed Skin cancer: Discussed monitoring for atypical lesions Colorectal cancer: does not want to have colonoscopy but having scans per Dr. Jacobo  Prostate cancer:  yes - getting checked this Fall by Dr. Jacobo  Lab Results  Component Value Date   PSA 1.7 07/03/2016    ECG:  2025  Vaccines: reviewed with the patient.   Advanced Care Planning: A voluntary discussion about advance care planning including the explanation and discussion of advance directives.  Discussed health care proxy and Living will, and the patient was able to identify a health care proxy as wife .  Patient does not have a living will and power of attorney of health care   Patient Active Problem List   Diagnosis Date Noted   Chemotherapy-induced neuropathy (HCC) 11/27/2022   Asthmatic bronchitis , chronic (HCC) 11/27/2022   Bronchial stenosis, left 12/23/2021   Excessive granulation tissue     History of lobectomy of lung 04/07/2021   Metastatic cancer to lung (HCC) 08/11/2020   Essential hypertension 08/11/2020   Dyslipidemia 08/11/2020   Spasm of muscle of lower back 08/11/2020   Peyronie's disease 10/15/2019   Tobacco use 10/15/2019   Glaucoma of right eye 03/22/2018   Erectile dysfunction 03/22/2018   Decreased vision of right eye 09/21/2017   Colostomy in place [facility]) 09/21/2017   Hypogonadism in male 09/21/2017   Perennial allergic rhinitis with seasonal variation    Degeneration of lumbar intervertebral disc 06/26/2017   Rectal cancer (HCC) 08/05/2014  Past Surgical History:  Procedure Laterality Date   CHOLECYSTECTOMY     COLON SURGERY     has a permanment ostomy   ENDOBRONCHIAL ULTRASOUND N/A 07/01/2018   Procedure: ENDOBRONCHIAL ULTRASOUND;  Surgeon: Tamea Dedra CROME, MD;  Location: [facility] ORS;  Service: Cardiopulmonary;  Laterality: N/A;   FLEXIBLE BRONCHOSCOPY Left 11/20/2018   Procedure: FLEXIBLE BRONCHOSCOPY;  Surgeon: Tamea Dedra CROME, MD;  Location: [facility] OR[facility];  Service: Cardiopulmonary;  Laterality: Left;   FLEXIBLE BRONCHOSCOPY Left 12/23/2021   Procedure: FLEXIBLE BRONCHOSCOPY;  Surgeon: Tamea Dedra CROME, MD;  Location: [facility];  Service: Pulmonary;  Laterality: Left;   FLEXIBLE BRONCHOSCOPY Left 08/09/2022   Procedure: FLEXIBLE BRONCHOSCOPY;  Surgeon: Tamea Dedra CROME, MD;  Location: [facility];  Service: Pulmonary;  Laterality: Left;   FLEXIBLE BRONCHOSCOPY Left 04/30/2023   Procedure: BRONCHOSCOPY, FLEXIBLE;  Surgeon: Tamea Dedra CROME, MD;  Location: [facility];  Service: Pulmonary;  Laterality: Left;   INTERCOSTAL NERVE BLOCK Left 02/03/2019   Procedure: INTERCOSTAL NERVE BLOCK WITH EXPAREL ;  Surgeon: Army Dallas NOVAK, MD;  Location: [facility] OR;  Service: Thoracic;  Laterality: Left;   KNEE ARTHROSCOPY Right    KNEE SURGERY Left    torn ligaments (staple in place)   laceration of wrist Right    LOBECTOMY Left 02/03/2019   Procedure: LEFT UPPER  LOBECTOMY WITH LYMPH NODE DISSECTION;  Surgeon: Army Dallas NOVAK, MD;  Location: [facility] OR;  Service: Thoracic;  Laterality: Left;   LUMBAR DISC SURGERY     ruptured disc   PORT A CATH REVISION Left 08/05/2018   Procedure: PORT A CATH REVISION, REPOSITION LEFT;  Surgeon: Dessa Reyes ORN, MD;  Location: [facility];  Service: General;  Laterality: Left;   PORT-A-CATH REMOVAL Right 12/02/2014   Procedure: REMOVAL PORT-A-CATH;  Surgeon: Lonni Brands, MD;  Location: [facility];  Service: General;  Laterality: Right;   PORTACATH PLACEMENT     PORTACATH PLACEMENT Left 07/29/2018   Procedure: INSERTION PORT-A-CATH LEFT;  Surgeon: Dessa Reyes ORN, MD;  Location: [facility] OR[facility];  Service: General;  Laterality: Left;   RECTAL BIOPSY  04/30/2013   RIGID BRONCHOSCOPY Left 08/09/2022   Procedure: RIGID BRONCHOSCOPY;  Surgeon: Tamea Dedra CROME, MD;  Location: [facility];  Service: Pulmonary;  Laterality: Left;   SPINE SURGERY  1989   THORACOTOMY/LOBECTOMY Left 02/03/2019   Procedure: MINITHORACOTOMY;  Surgeon: Army Dallas NOVAK, MD;  Location: [facility] OR;  Service: Thoracic;  Laterality: Left;   VIDEO ASSISTED THORACOSCOPY (VATS)/WEDGE RESECTION Left 02/03/2019   Procedure: VIDEO ASSISTED THORACOSCOPY (VATS)/LUNG RESECTION;  Surgeon: Army Dallas NOVAK, MD;  Location: [facility] OR;  Service: Thoracic;  Laterality: Left;   VIDEO BRONCHOSCOPY N/A 02/03/2019   Procedure: VIDEO BRONCHOSCOPY;  Surgeon: Army Dallas NOVAK, MD;  Location: [facility] OR;  Service: Thoracic;  Laterality: N/A;   VIDEO BRONCHOSCOPY N/A 10/28/2021   Procedure: VIDEO BRONCHOSCOPY WITH CRYO and DEBULKING;  Surgeon: Tamea Dedra CROME, MD;  Location: [facility];  Service: Cardiopulmonary;  Laterality: N/A;   VIDEO BRONCHOSCOPY WITH ENDOBRONCHIAL ULTRASOUND N/A 12/30/2018   Procedure: VIDEO BRONCHOSCOPY WITH ENDOBRONCHIAL ULTRASOUND;  Surgeon: Army Dallas NOVAK, MD;  Location: [facility] OR;  Service: Thoracic;  Laterality: N/A;   XI ROBOT ABDOMINAL PERINEAL  RESECTION  10/09/2013    Family History  Problem Relation Age of Onset   Cancer Mother    Hypertension Mother    COPD Mother    Vision loss Mother    COPD Father    Cancer Father        Prostate CA,  on Lupron   Hearing loss Father    Stroke Maternal Grandmother    Cancer Paternal Grandmother    Heart attack Paternal Grandfather    Cancer Paternal Grandfather     Social History   Socioeconomic History   Marital status: Married    Spouse name: Alfonse   Number of children: 1   Years of education: Not on file   Highest education level: Some college, no degree  Occupational History   Not on file  Tobacco Use   Smoking status: Former    Types: Cigars   Smokeless tobacco: Current    Types: Chew   Tobacco comments:    No Cigars. Does Dip. Khj 02/16/2023  Vaping Use   Vaping status: Never Used  Substance and Sexual Activity   Alcohol use: Yes    Comment: Occassional beer   Drug use: Never   Sexual activity: Yes    Partners: Female    Birth control/protection: None  Other Topics Concern   Not on file  Social History Narrative   Not on file   Social Drivers of Health   Financial Resource Strain: Low Risk  (08/23/2023)   Overall Financial Resource Strain (CARDIA)    Difficulty of Paying Living Expenses: Not hard at all  Food Insecurity: No Food Insecurity (08/23/2023)   Hunger Vital Sign    Worried About Running Out of Food in the Last Year: Never true    Ran Out of Food in the Last Year: Never true  Transportation Needs: No Transportation Needs (08/23/2023)   PRAPARE - Administrator, Civil Service (Medical): No    Lack of Transportation (Non-Medical): No  Physical Activity: Insufficiently Active (08/23/2023)   Exercise Vital Sign    Days of Exercise per Week: 3 days    Minutes of Exercise per Session: 20 min  Stress: No Stress Concern Present (08/23/2023)   [employer] of Occupational Health - Occupational Stress Questionnaire    Feeling of Stress:  Not at all  Social Connections: Moderately Integrated (08/23/2023)   Social Connection and Isolation Panel    Frequency of Communication with Friends and Family: More than three times a week    Frequency of Social Gatherings with Friends and Family: More than three times a week    Attends Religious Services: Patient declined    Database administrator or Organizations: Yes    Attends Engineer, structural: More than 4 times per year    Marital Status: Married  Catering manager Violence: Not At Risk (08/23/2023)   Humiliation, Afraid, Rape, and Kick questionnaire    Fear of Current or Ex-Partner: No    Emotionally Abused: No    Physically Abused: No    Sexually Abused: No     Current Outpatient Medications:    acetaminophen  (TYLENOL ) 500 MG tablet, Take 1,000 mg by mouth every 6 (six) hours as needed for mild pain., Disp: , Rfl:    albuterol  (VENTOLIN  HFA) 108 (90 Base) MCG/ACT inhaler, INHALE 2 PUFFS BY MOUTH INTO THE LUNGS EVERY 6 HOURS AS NEEDED FOR WHEEZING OR SHORTNESS OF BREATH, Disp: 18 each, Rfl: 1   amLODipine  (NORVASC ) 10 MG tablet, Take 1 tablet (10 mg total) by mouth daily., Disp: 90 tablet, Rfl: 3   calcium  carbonate (TUMS - DOSED IN MG ELEMENTAL CALCIUM ) 500 MG chewable tablet, Chew 2 tablets by mouth as needed for indigestion or heartburn., Disp: , Rfl:    cetirizine (ZYRTEC) 10 MG tablet, Take 10 mg  by mouth at bedtime. , Disp: , Rfl:    cyclobenzaprine  (FLEXERIL ) 10 MG tablet, TAKE 1 TABLET BY MOUTH THREE TIMES A DAY AS NEEDED FOR MUSCLE SPASM, Disp: 90 tablet, Rfl: 3   loperamide  (IMODIUM ) 2 MG capsule, Take 4 mg by mouth as needed for diarrhea or loose stools. , Disp: , Rfl:    tadalafil  (CIALIS ) 20 MG tablet, TAKE 1 TAB BY MOUTH 1 HOUR PRIOR TO INTERCOURSE AS NEEDED, Disp: 30 tablet, Rfl: 3   WIXELA INHUB 250-50 MCG/ACT AEPB, INHALE 1 PUFF INTO THE LUNGS IN THE MORNING AND AT BEDTIME., Disp: 60 each, Rfl: 5   Multiple Vitamin (MULTIVITAMIN WITH MINERALS) TABS  tablet, Take 1 tablet by mouth every evening.  (Patient not taking: Reported on 10/02/2023), Disp: , Rfl:  No current facility-administered medications for this visit.  Facility-Administered Medications Ordered in Other Visits:    heparin  lock flush 100 unit/mL, 500 Units, Intravenous, Once, Finnegan, Evalene PARAS, MD   sodium chloride  flush (NS) 0.9 % injection 10 mL, 10 mL, Intravenous, Once, Finnegan, Evalene PARAS, MD  Allergies  Allergen Reactions   Sulfa Antibiotics Hives   Morphine  And Codeine Other (See Comments)    Altered mental status     ROS  Constitutional: Negative for fever or weight change.  Respiratory: Negative for cough , shortness of breath improved with Wixela    Cardiovascular: Negative for chest pain or palpitations.  Gastrointestinal: Negative for abdominal pain, no bowel changes.  Musculoskeletal: Negative for gait problem or joint swelling.  Skin: Negative for rash.  Neurological: Negative for dizziness or headache.  No other specific complaints in a complete review of systems (except as listed in HPI above).    Objective  Vitals:   10/02/23 1037  BP: 136/80  Pulse: 98  Resp: 16  SpO2: 98%  Weight: 277 lb 9.6 oz (125.9 kg)  Height: 6' 2.5 (1.892 m)    Body mass index is 35.16 kg/m.  Physical Exam  Constitutional: Patient appears well-developed and well-nourished. No distress.  HENT: Head: Normocephalic and atraumatic. Ears: B TMs ok, no erythema or effusion; Nose: Nose normal. Mouth/Throat: Oropharynx is clear and moist. No oropharyngeal exudate.  Eyes: Conjunctivae and EOM are normal. Pupils are equal, round, and reactive to light. No scleral icterus.  Neck: Normal range of motion. Neck supple. No JVD present. No thyromegaly present.  Cardiovascular: Normal rate, regular rhythm and normal heart sounds.  No murmur heard. No BLE edema. Pulmonary/Chest: Effort normal and breath sounds normal. No respiratory distress. Abdominal: Soft. Bowel sounds are  normal, no distension. There is no tenderness. no masses MALE GENITALIA: not done  RECTAL:not done  Musculoskeletal: Normal range of motion, no joint effusions. No gross deformities Neurological: he is alert and oriented to person, place, and time. No cranial nerve deficit. Coordination, balance, strength, speech and gait are normal.  Skin: Skin is warm and dry. No rash noted. No erythema.  Psychiatric: Patient has a normal mood and affect. behavior is normal. Judgment and thought content normal.     Assessment & Plan   Assessment & Plan Adult Wellness Visit Routine wellness visit with stable health status. Blood pressure controlled. Discussed diet and physical activity. Eye exam scheduled. Dental exam overdue. - Check A1c for hyperglycemia. - Check liver function. - Check cholesterol levels. - Schedule flu shot at [facility] and notify provider when done.  Chronic obstructive pulmonary disease with asthmatic bronchitis and left bronchial stenosis after left upper lobectomy for metastatic rectal adenocarcinoma Chronic condition  with improved symptoms on Wixela. Occasional exertional dyspnea. Follow-up with pulmonologist post-scan for bronchial stenosis management. - Continue Wixela twice daily. - Follow up with pulmonologist after scan.  Chemotherapy-induced polyneuropathy Experiences burning and altered sensation in feet. Gabapentin  caused diarrhea. Symptoms manageable without medication.  Colostomy status after rectal cancer surgery Colostomy in place. No active cancer. Regular oncologist follow-up and imaging every six months. Prefers scans over colonoscopy. - Continue regular follow-up with oncologist. - Undergo imaging every six months.  Atherosclerosis of aorta Not on cholesterol medication. Discussed lifestyle modifications. Blood pressure controlled with amlodipine . - Refill amlodipine  prescription.  Essential hypertension Blood pressure well controlled with current  medication. - Refill amlodipine  prescription.  Muscle spasm of lower back Experiences morning and post-activity spasms. Takes cyclobenzaprine  at night. Insurance requires justification. Prefers cyclobenzaprine . - Send cyclobenzaprine  refill with justification for muscle spasms. - Consider alternative muscle relaxants if insurance does not cover cyclobenzaprine .  Hyperlipidemia Not on cholesterol medication. Discussed diet and lifestyle. Monitoring with upcoming labs. - Check cholesterol levels.  Hyperglycemia Diet includes red meat and eating out. Monitoring with A1c test. - Check A1c for hyperglycemia.         -Prostate cancer screening and PSA options (with potential risks and benefits of testing vs not testing) were discussed along with recent recs/guidelines. -USPSTF grade A and B recommendations reviewed with patient; age-appropriate recommendations, preventive care, screening tests, etc discussed and encouraged; healthy living encouraged; see AVS for patient education given to patient -Discussed importance of 150 minutes of physical activity weekly, eat two servings of fish weekly, eat one serving of tree nuts ( cashews, pistachios, pecans, almonds.SABRA) every other day, eat 6 servings of fruit/vegetables daily and drink plenty of water and avoid sweet beverages.  -Reviewed Health Maintenance: yes

## 2023-10-09 ENCOUNTER — Inpatient Hospital Stay

## 2023-10-10 DIAGNOSIS — Z933 Colostomy status: Secondary | ICD-10-CM | POA: Diagnosis not present

## 2023-10-12 DIAGNOSIS — I700 Atherosclerosis of aorta: Secondary | ICD-10-CM | POA: Diagnosis not present

## 2023-10-12 DIAGNOSIS — R739 Hyperglycemia, unspecified: Secondary | ICD-10-CM | POA: Diagnosis not present

## 2023-10-12 DIAGNOSIS — I10 Essential (primary) hypertension: Secondary | ICD-10-CM | POA: Diagnosis not present

## 2023-10-13 LAB — COMPREHENSIVE METABOLIC PANEL WITH GFR
ALT: 15 IU/L (ref 0–44)
AST: 18 IU/L (ref 0–40)
Albumin: 4.4 g/dL (ref 3.9–4.9)
Alkaline Phosphatase: 89 IU/L (ref 47–123)
BUN/Creatinine Ratio: 17 (ref 10–24)
BUN: 17 mg/dL (ref 8–27)
Bilirubin Total: 0.2 mg/dL (ref 0.0–1.2)
CO2: 21 mmol/L (ref 20–29)
Calcium: 9.6 mg/dL (ref 8.6–10.2)
Chloride: 106 mmol/L (ref 96–106)
Creatinine, Ser: 1.01 mg/dL (ref 0.76–1.27)
Globulin, Total: 2.1 g/dL (ref 1.5–4.5)
Glucose: 103 mg/dL — ABNORMAL HIGH (ref 70–99)
Potassium: 5.3 mmol/L — ABNORMAL HIGH (ref 3.5–5.2)
Sodium: 143 mmol/L (ref 134–144)
Total Protein: 6.5 g/dL (ref 6.0–8.5)
eGFR: 82 mL/min/1.73 (ref >59)

## 2023-10-13 LAB — LIPID PANEL
Chol/HDL Ratio: 3.8 ratio (ref 0.0–5.0)
Cholesterol, Total: 154 mg/dL (ref 100–199)
HDL: 41 mg/dL (ref >39)
LDL Chol Calc (NIH): 97 mg/dL (ref 0–99)
Triglycerides: 83 mg/dL (ref 0–149)
VLDL Cholesterol Cal: 16 mg/dL (ref 5–40)

## 2023-10-13 LAB — HEMOGLOBIN A1C
Est. average glucose Bld gHb Est-mCnc: 120 mg/dL
Hgb A1c MFr Bld: 5.8 % — ABNORMAL HIGH (ref 4.8–5.6)

## 2023-10-15 ENCOUNTER — Ambulatory Visit: Payer: Self-pay | Admitting: Family Medicine

## 2023-10-19 DIAGNOSIS — Z933 Colostomy status: Secondary | ICD-10-CM | POA: Diagnosis not present

## 2023-10-24 ENCOUNTER — Inpatient Hospital Stay

## 2023-10-25 ENCOUNTER — Inpatient Hospital Stay: Attending: Oncology

## 2023-10-25 ENCOUNTER — Ambulatory Visit
Admission: RE | Admit: 2023-10-25 | Discharge: 2023-10-25 | Disposition: A | Discharge status: Home / Self Care | Source: Ambulatory Visit | Attending: Oncology | Admitting: Oncology

## 2023-10-25 DIAGNOSIS — D508 Other iron deficiency anemias: Secondary | ICD-10-CM | POA: Diagnosis not present

## 2023-10-25 DIAGNOSIS — R911 Solitary pulmonary nodule: Secondary | ICD-10-CM | POA: Diagnosis not present

## 2023-10-25 DIAGNOSIS — C20 Malignant neoplasm of rectum: Secondary | ICD-10-CM | POA: Insufficient documentation

## 2023-10-25 LAB — CBC WITH DIFFERENTIAL (CANCER CENTER ONLY)
Abs Immature Granulocytes: 0.02 K/uL (ref 0.00–0.07)
Basophils Absolute: 0.1 K/uL (ref 0.0–0.1)
Basophils Relative: 2 %
Eosinophils Absolute: 0.2 K/uL (ref 0.0–0.5)
Eosinophils Relative: 4 %
HCT: 33.7 % — ABNORMAL LOW (ref 39.0–52.0)
Hemoglobin: 10.7 g/dL — ABNORMAL LOW (ref 13.0–17.0)
Immature Granulocytes: 0 %
Lymphocytes Relative: 18 %
Lymphs Abs: 0.8 K/uL (ref 0.7–4.0)
MCH: 24.4 pg — ABNORMAL LOW (ref 26.0–34.0)
MCHC: 31.8 g/dL (ref 30.0–36.0)
MCV: 76.9 fL — ABNORMAL LOW (ref 80.0–100.0)
Monocytes Absolute: 0.5 K/uL (ref 0.1–1.0)
Monocytes Relative: 10 %
Neutro Abs: 3.1 K/uL (ref 1.7–7.7)
Neutrophils Relative %: 66 %
Platelet Count: 464 K/uL — ABNORMAL HIGH (ref 150–400)
RBC: 4.38 MIL/uL (ref 4.22–5.81)
RDW: 16.2 % — ABNORMAL HIGH (ref 11.5–15.5)
WBC Count: 4.6 K/uL (ref 4.0–10.5)
nRBC: 0 % (ref 0.0–0.2)

## 2023-10-25 LAB — CMP (CANCER CENTER ONLY)
ALT: 20 U/L (ref 0–44)
AST: 21 U/L (ref 15–41)
Albumin: 4.1 g/dL (ref 3.5–5.0)
Alkaline Phosphatase: 65 U/L (ref 38–126)
Anion gap: 8 (ref 5–15)
BUN: 29 mg/dL — ABNORMAL HIGH (ref 8–23)
CO2: 22 mmol/L (ref 22–32)
Calcium: 9 mg/dL (ref 8.9–10.3)
Chloride: 107 mmol/L (ref 98–111)
Creatinine: 1.18 mg/dL (ref 0.61–1.24)
GFR, Estimated: >60 mL/min (ref >60)
Glucose, Bld: 107 mg/dL — ABNORMAL HIGH (ref 70–99)
Potassium: 4 mmol/L (ref 3.5–5.1)
Sodium: 137 mmol/L (ref 135–145)
Total Bilirubin: 0.6 mg/dL (ref 0.0–1.2)
Total Protein: 7.1 g/dL (ref 6.5–8.1)

## 2023-10-25 MED ORDER — IOHEXOL 300 MG/ML  SOLN
100.0000 mL | Freq: Once | INTRAMUSCULAR | Status: AC | PRN
  Administered 2023-10-25: 100 mL via INTRAVENOUS

## 2023-10-25 MED ORDER — BARIUM SULFATE 2 % PO SUSP
450.0000 mL | Freq: Once | ORAL | Status: AC
  Administered 2023-10-25: 450 mL via ORAL

## 2023-10-25 MED ORDER — HEPARIN SOD (PORK) LOCK FLUSH 100 UNIT/ML IV SOLN
500.0000 [IU] | Freq: Once | INTRAVENOUS | Status: AC
  Administered 2023-10-25: 500 [IU] via INTRAVENOUS

## 2023-10-26 LAB — CEA: CEA: 2.2 ng/mL (ref 0.0–4.7)

## 2023-10-31 ENCOUNTER — Ambulatory Visit: Admitting: Oncology

## 2023-10-31 ENCOUNTER — Telehealth: Admitting: Oncology

## 2023-11-01 ENCOUNTER — Other Ambulatory Visit: Payer: Self-pay | Admitting: *Deleted

## 2023-11-01 ENCOUNTER — Ambulatory Visit: Admitting: Pulmonary Disease

## 2023-11-01 ENCOUNTER — Encounter: Payer: Self-pay | Admitting: Pulmonary Disease

## 2023-11-01 VITALS — BP 130/76 | HR 93 | Temp 97.6°F | Ht 74.5 in | Wt 271.8 lb

## 2023-11-01 DIAGNOSIS — D649 Anemia, unspecified: Secondary | ICD-10-CM | POA: Diagnosis not present

## 2023-11-01 DIAGNOSIS — Z23 Encounter for immunization: Secondary | ICD-10-CM

## 2023-11-01 DIAGNOSIS — C20 Malignant neoplasm of rectum: Secondary | ICD-10-CM

## 2023-11-01 DIAGNOSIS — J9809 Other diseases of bronchus, not elsewhere classified: Secondary | ICD-10-CM

## 2023-11-01 DIAGNOSIS — J4489 Other specified chronic obstructive pulmonary disease: Secondary | ICD-10-CM

## 2023-11-01 DIAGNOSIS — R0602 Shortness of breath: Secondary | ICD-10-CM

## 2023-11-01 LAB — NITRIC OXIDE: Nitric Oxide: 31

## 2023-11-01 MED ORDER — AIRSUPRA 90-80 MCG/ACT IN AERO: 2.0000 | INHALATION_SPRAY | Freq: Four times a day (QID) | RESPIRATORY_TRACT | 2 refills | Status: AC | PRN

## 2023-11-01 NOTE — Progress Notes (Unsigned)
 Subjective:    Patient ID: [name] [name], male    DOB: 12-22-1955, 68 y.o.   MRN: [id number]  Patient Care Team: Sowles, Krichna, MD as PCP - General (Family Medicine) Jacobo Evalene PARAS, MD as Consulting Physician (Oncology) Twylla Glendia BROCKS, MD (Urology) Myrna Adine Anes, MD as Consulting Physician (Ophthalmology) Glenard Mire, MD as Attending Physician (Family Medicine) Tamea Dedra LITTIE, MD as Consulting Physician (Pulmonary Disease) Cardiovascular, [facility] [place], [place], [name]  Chief Complaint  Patient presents with   Medical Management of Chronic Issues    Shortness of breath on exertion.     BACKGROUND/INTERVAL:[name] [name] is a 68 year old lifelong never smoker (does chew tobacco) who follows on the issue of asthmatic bronchitis and left bronchial stenosis due to granulation tissue on the bronchial stump from prior left upper lobectomy for lung metastatic deposit from stage IV rectal adenocarcinoma.  This is a scheduled visit.  Patient underwent balloon dilation and cryotherapy to the granulation tissue/bronchial stricture on 10/2021, 12/2021 and 07/2022.  Patient was last seen on 15 May 2023, following repeat cryotherapy and balloon dilation on 30 April 2023.    HPI Discussed the use of AI scribe software for clinical note transcription with the patient, who gave verbal consent to proceed.  History of Present Illness   [name] [name] is a 68 year old male with asthmatic bronchitis and history of metastatic rectal carcinoma to the lung who presents with shortness of breath mostly expressed as fatigue.  He has experienced increased fatigue over the past two to three weeks, requiring less exertion to become symptomatic. He feels fatigued and is concerned about the impact of anemia on his energy levels.  He has a history of anemia and reports that his recent blood work showed his levels have gone down. He has not noticed any changes in bowel movements or signs of  bleeding, although his wife has suggested the possibility of a small bleed contributing to his anemia.  He experiences minor congestion with the change in weather but denies any major respiratory symptoms aside from shortness of breath.  He is currently using Wixela as a regular inhaler.   He had CT chest abdomen and pelvis on 25 October 2023 without evidence of progressive disease in the chest abdomen or pelvis.  The area previously dilated appears to be patent.    Review of Systems A 10 point review of systems was performed and it is as noted above otherwise negative.   Patient Active Problem List   Diagnosis Date Noted   Chemotherapy-induced neuropathy 11/27/2022   Asthmatic bronchitis , chronic (HCC) 11/27/2022   Bronchial stenosis, left 12/23/2021   Excessive granulation tissue    History of lobectomy of lung 04/07/2021   Metastatic cancer to lung (HCC) 08/11/2020   Essential hypertension 08/11/2020   Dyslipidemia 08/11/2020   Spasm of muscle of lower back 08/11/2020   Peyronie's disease 10/15/2019   Tobacco use 10/15/2019   Glaucoma of right eye 03/22/2018   Erectile dysfunction 03/22/2018   Decreased vision of right eye 09/21/2017   Colostomy in place [facility]) 09/21/2017   Hypogonadism in male 09/21/2017   Perennial allergic rhinitis with seasonal variation    Degeneration of lumbar intervertebral disc 06/26/2017   Rectal cancer (HCC) 08/05/2014    Social History   Tobacco Use   Smoking status: Former    Types: Cigars   Smokeless tobacco: Current    Types: Chew   Tobacco comments:    No Cigars. Does Dip.  Khj 02/16/2023  Substance Use Topics   Alcohol use: Yes    Comment: Occassional beer    Allergies  Allergen Reactions   Sulfa Antibiotics Hives   Gabapentin  Diarrhea   Morphine  And Codeine Other (See Comments)    Altered mental status    Current Meds  Medication Sig   acetaminophen  (TYLENOL ) 500 MG tablet Take 1,000 mg by mouth every 6 (six) hours as needed  for mild pain.   Albuterol -Budesonide  (AIRSUPRA) 90-80 MCG/ACT AERO Inhale 2 puffs into the lungs every 6 (six) hours as needed.   amLODipine  (NORVASC ) 10 MG tablet Take 1 tablet (10 mg total) by mouth daily.   calcium  carbonate (TUMS - DOSED IN MG ELEMENTAL CALCIUM ) 500 MG chewable tablet Chew 2 tablets by mouth as needed for indigestion or heartburn.   cetirizine (ZYRTEC) 10 MG tablet Take 10 mg by mouth at bedtime.    cyclobenzaprine  (FLEXERIL ) 10 MG tablet TAKE 1 TABLET BY MOUTH THREE TIMES A DAY AS NEEDED FOR MUSCLE SPASM   loperamide  (IMODIUM ) 2 MG capsule Take 4 mg by mouth as needed for diarrhea or loose stools.    tadalafil  (CIALIS ) 20 MG tablet TAKE 1 TAB BY MOUTH 1 HOUR PRIOR TO INTERCOURSE AS NEEDED   WIXELA INHUB 250-50 MCG/ACT AEPB INHALE 1 PUFF INTO THE LUNGS IN THE MORNING AND AT BEDTIME.   [DISCONTINUED] albuterol  (VENTOLIN  HFA) 108 (90 Base) MCG/ACT inhaler INHALE 2 PUFFS BY MOUTH INTO THE LUNGS EVERY 6 HOURS AS NEEDED FOR WHEEZING OR SHORTNESS OF BREATH    Immunization History  Administered Date(s) Administered   Fluad Quad(high Dose 65+) 10/12/2021   INFLUENZA, HIGH DOSE SEASONAL PF 11/23/2022, 11/01/2023   Influenza,inj,Quad PF,6+ Mos 03/06/2017, 09/21/2017, 11/28/2018, 10/15/2019   Influenza-Unspecified 11/17/2020   PFIZER(Purple Top)SARS-COV-2 Vaccination 03/10/2019, 04/09/2019   PNEUMOCOCCAL CONJUGATE-20 10/12/2021   Pfizer(Comirnaty)Fall Seasonal Vaccine 12 years and older 11/23/2022   Respiratory Syncytial Virus Vaccine,Recomb Aduvanted(Arexvy) 02/21/2022   Tdap 02/21/2016   Zoster Recombinant(Shingrix ) 07/03/2016, 11/23/2022        Objective:     BP 130/76   Pulse 93   Temp 97.6 F (36.4 C) (Temporal)   Ht 6' 2.5 (1.892 m)   Wt 271 lb 12.8 oz (123.3 kg)   SpO2 98%   BMI 34.43 kg/m   SpO2: 98 %  GENERAL: Well-developed, well-nourished gentleman no acute distress, fully ambulatory, no conversational dyspnea. HEAD: Normocephalic, atraumatic.   EYES: Pupils equal, round, reactive to light.  No scleral icterus.  MOUTH: Oral mucosa moist.  No thrush. NECK: Supple. No thyromegaly. Trachea midline. No JVD.  No adenopathy. PULMONARY: Good air entry bilaterally.  No adventitious sounds. CARDIOVASCULAR: S1 and S2. Regular rate and rhythm.  No rubs, murmurs or gallops heard.  Port-A-Cath on left. ABDOMEN: Benign. MUSCULOSKELETAL: No joint deformity, no clubbing, no edema.  NEUROLOGIC: No overt focal deficit, no gait disturbance, speech is fluent. SKIN: Intact,warm,dry. PSYCH: Mood and behavior normal.   Lab Results  Component Value Date   NITRICOXIDE 31 11/01/2023  *Intermediate elevation of nitric oxide  suggestive of type II inflammation   Assessment & Plan:     ICD-10-CM   1. Asthmatic bronchitis , chronic (HCC)  J44.89     2. Shortness of breath  R06.02 Nitric oxide     3. Bronchial stenosis, left  J98.09     4. Rectal cancer (HCC)  C20       Orders Placed This Encounter  Procedures   Flu vaccine HIGH DOSE PF(Fluzone Trivalent)   Nitric oxide   Meds ordered this encounter  Medications   Albuterol -Budesonide  (AIRSUPRA) 90-80 MCG/ACT AERO    Sig: Inhale 2 puffs into the lungs every 6 (six) hours as needed.    Dispense:  10.7 g    Refill:  2   Discussion:    Shortness of breath Increased shortness of breath over the past two to three weeks, likely related to anemia and airway inflammation. No significant congestion. CT scan shows the previously dilated area remains open. - Patient has upcoming appointment with hematology/oncology (Dr. Jacobo).  Anemia, unspecified Anemia with recent blood work showing decreased hemoglobin levels. Possible blood loss suspected, but no changes in bowel movements or other symptoms reported. Further evaluation needed to determine the cause. - Coordinate with oncologist for further evaluation of anemia at the cancer center. - Perform anemia panel to provide additional data for  the oncologist (will be drawn at cancer center through port).  Airway inflammation with acute bronchospasm Intermediate elevation in airway inflammation indicated by a nitric oxide  level of 31, likely exacerbated by environmental factors such as ragweed. Current inhaler strength may be insufficient. - Prescribe Airsupra inhaler as a rescue inhaler to replace albuterol , providing additional steroid to reduce inflammation. - Continue current Wixela inhaler regimen. - Avoid increasing the strength of the current inhaler to prevent potential airway irritation.  Need for influenza vaccination Patient received influenza vaccine today.  Follow-Up Follow-up appointment scheduled to reassess symptoms and treatment efficacy. - Schedule follow-up appointment in 6-8 weeks to reassess symptoms and treatment efficacy.    Advised if symptoms do not improve or worsen, to please contact office for sooner follow up or seek emergency care.    I spent 40 minutes of dedicated to the care of this patient on the date of this encounter to include pre-visit review of records, face-to-face time with the patient discussing conditions above, post visit ordering of testing, clinical documentation with the electronic health record, making appropriate referrals as documented, and communicating necessary findings to members of the patients care team.     C. Leita Sanders, MD Advanced Bronchoscopy PCCM [facility]    *This note was generated using voice recognition software/Dragon and/or AI transcription program.  Despite best efforts to proofread, errors can occur which can change the meaning. Any transcriptional errors that result from this process are unintentional and may not be fully corrected at the time of dictation.

## 2023-11-01 NOTE — Patient Instructions (Signed)
 VISIT SUMMARY:  Today, you were seen for increased shortness of breath and concerns about your anemia. We discussed your symptoms, reviewed your recent blood work, and made adjustments to your treatment plan to help manage your conditions.  YOUR PLAN:  -SHORTNESS OF BREATH: Your shortness of breath has worsened over the past few weeks, likely due to your anemia and some airway inflammation. We will perform an anemia panel to check for potential blood loss or other causes of your anemia.  -ANEMIA, UNSPECIFIED: Anemia means you have a lower than normal number of red blood cells, which can cause fatigue and shortness of breath. We will coordinate with your oncologist for further evaluation and perform an anemia panel to gather more information.  -AIRWAY INFLAMMATION WITH ASTHMA: You have some inflammation in your airways, which can cause breathing difficulties. We are prescribing an Airsupra inhaler to use as a rescue inhaler, which includes a steroid to help reduce inflammation. Continue using your current Wixela inhaler as well.  INSTRUCTIONS:  Please schedule a follow-up appointment in 6-8 weeks to reassess your symptoms and the effectiveness of your treatment. Additionally, we will coordinate with your oncologist for further evaluation of your anemia.

## 2023-11-02 ENCOUNTER — Encounter: Payer: Self-pay | Admitting: Pulmonary Disease

## 2023-11-07 ENCOUNTER — Encounter: Payer: Self-pay | Admitting: Oncology

## 2023-11-07 ENCOUNTER — Inpatient Hospital Stay

## 2023-11-07 ENCOUNTER — Inpatient Hospital Stay: Admitting: Oncology

## 2023-11-07 VITALS — BP 148/92 | HR 94 | Temp 98.5°F | Resp 16 | Ht 74.5 in | Wt 269.0 lb

## 2023-11-07 DIAGNOSIS — D508 Other iron deficiency anemias: Secondary | ICD-10-CM | POA: Diagnosis not present

## 2023-11-07 DIAGNOSIS — C20 Malignant neoplasm of rectum: Secondary | ICD-10-CM | POA: Diagnosis not present

## 2023-11-07 DIAGNOSIS — D509 Iron deficiency anemia, unspecified: Secondary | ICD-10-CM | POA: Diagnosis not present

## 2023-11-07 LAB — IRON AND TIBC
Iron: 21 ug/dL — ABNORMAL LOW (ref 45–182)
Saturation Ratios: 5 % — ABNORMAL LOW (ref 17.9–39.5)
TIBC: 391 ug/dL (ref 250–450)
UIBC: 370 ug/dL

## 2023-11-07 LAB — CBC WITH DIFFERENTIAL/PLATELET
Abs Immature Granulocytes: 0.02 K/uL (ref 0.00–0.07)
Basophils Absolute: 0.1 K/uL (ref 0.0–0.1)
Basophils Relative: 1 %
Eosinophils Absolute: 0.2 K/uL (ref 0.0–0.5)
Eosinophils Relative: 4 %
HCT: 29.6 % — ABNORMAL LOW (ref 39.0–52.0)
Hemoglobin: 9.2 g/dL — ABNORMAL LOW (ref 13.0–17.0)
Immature Granulocytes: 1 %
Lymphocytes Relative: 22 %
Lymphs Abs: 1 K/uL (ref 0.7–4.0)
MCH: 23.8 pg — ABNORMAL LOW (ref 26.0–34.0)
MCHC: 31.1 g/dL (ref 30.0–36.0)
MCV: 76.7 fL — ABNORMAL LOW (ref 80.0–100.0)
Monocytes Absolute: 0.4 K/uL (ref 0.1–1.0)
Monocytes Relative: 9 %
Neutro Abs: 2.8 K/uL (ref 1.7–7.7)
Neutrophils Relative %: 63 %
Platelets: 472 K/uL — ABNORMAL HIGH (ref 150–400)
RBC: 3.86 MIL/uL — ABNORMAL LOW (ref 4.22–5.81)
RDW: 16.6 % — ABNORMAL HIGH (ref 11.5–15.5)
WBC: 4.3 K/uL (ref 4.0–10.5)
nRBC: 0 % (ref 0.0–0.2)

## 2023-11-07 LAB — FERRITIN: Ferritin: 8 ng/mL — ABNORMAL LOW (ref 24–336)

## 2023-11-07 NOTE — Progress Notes (Unsigned)
 [facility]  [phone]) [phone] [phone]) [phone]  ID: Duquan Gillooly Penman OB: 1956/01/14  MR#: [id number]  RDW#:[id number]  Patient Care Team: Glenard Mire, MD as PCP - General (Family Medicine) Jacobo Evalene PARAS, MD as Consulting Physician (Oncology) Twylla Glendia BROCKS, MD (Urology) Myrna Adine Anes, MD as Consulting Physician (Ophthalmology) Glenard Mire, MD as Attending Physician (Family Medicine) Tamea Dedra CROME, MD as Consulting Physician (Pulmonary Disease) Cardiovascular, [facility] [place], [place], [name]  I connected with Darina CROME Jakubowicz on 11/07/23 at 10:45 AM EDT by video enabled telemedicine visit and verified that I am speaking with the correct person using two identifiers.   I discussed the limitations, risks, security and privacy concerns of performing an evaluation and management service by telemedicine and the availability of in-person appointments. I also discussed with the patient that there may be a patient responsible charge related to this service. The patient expressed understanding and agreed to proceed.   Other persons participating in the visit and their role in the encounter: Patient, MD.  Patient's location: Home. Provider's location: Clinic.  CHIEF COMPLAINT: Recurrent, stage IV rectal adenocarcinoma.  INTERVAL HISTORY: Patient agreed to video-assisted telemedicine visit for further evaluation and discussion of his imaging results.  Patient reports an extended bout of bronchitis recently, but otherwise feels well.  He currently feels well and is back to his baseline.  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  He has no neurologic complaints.  He denies any weakness or fatigue. He does not complain of peripheral neuropathy today.  He has occasional cough and shortness of breath, but denies any chest pain or hemoptysis.  He denies nausea, vomiting, constipation, or  diarrhea. He has noted no blood or melena in his colostomy bag.  He has no urinary complaints.  Patient offers no further specific complaints today.  REVIEW OF SYSTEMS:   Review of Systems  Constitutional:  Negative for fever, malaise/fatigue and weight loss.  Respiratory: Negative.  Negative for cough, hemoptysis and shortness of breath.   Cardiovascular: Negative.  Negative for chest pain and leg swelling.  Gastrointestinal: Negative.  Negative for abdominal pain, blood in stool, constipation, diarrhea and melena.  Genitourinary: Negative.  Negative for dysuria.  Musculoskeletal: Negative.  Negative for back pain.  Skin: Negative.  Negative for rash.  Neurological: Negative.  Negative for tingling, sensory change, focal weakness, weakness and headaches.  Psychiatric/Behavioral: Negative.  The patient is not nervous/anxious and does not have insomnia.     As per HPI. Otherwise, a complete review of systems is negative.  PAST MEDICAL HISTORY: Past Medical History:  Diagnosis Date  . Allergy   . Anemia   . Arthritis   . Asthmatic bronchitis , chronic (HCC) 2025  . Bronchial obstruction 07/2022  . 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 Bronchial stenosis, left 2025  . Cancer [facility]) 2015   rectal, followed by Oncology, permanent colostomy  . Cancer of upper lobe of left lung (HCC) 01/2019  . Colon cancer (HCC)   . Colostomy in place [facility])   . Dyslipidemia   . Dyspnea    on exertion  . Glaucoma   . History of kidney stones   . Hyperlipidemia   . Hypertension   . Mass of upper lobe of left lung 01/2019  . Neuromuscular disorder (HCC)   . Pneumonia   . Rectal cancer (HCC) 2015  . Seasonal allergies     PAST SURGICAL HISTORY:  Past Surgical History:  Procedure Laterality Date  . CHOLECYSTECTOMY    . COLON SURGERY     has a permanment ostomy  . ENDOBRONCHIAL ULTRASOUND N/A 07/01/2018   Procedure: ENDOBRONCHIAL ULTRASOUND;  Surgeon: Tamea Dedra CROME, MD;  Location: [facility] ORS;   Service: Cardiopulmonary;  Laterality: N/A;  . FLEXIBLE BRONCHOSCOPY Left 11/20/2018   Procedure: FLEXIBLE BRONCHOSCOPY;  Surgeon: Tamea Dedra CROME, MD;  Location: [facility] ORS;  Service: Cardiopulmonary;  Laterality: Left;  . FLEXIBLE BRONCHOSCOPY Left 12/23/2021   Procedure: FLEXIBLE BRONCHOSCOPY;  Surgeon: Tamea Dedra CROME, MD;  Location: [facility] ORS;  Service: Pulmonary;  Laterality: Left;  . FLEXIBLE BRONCHOSCOPY Left 08/09/2022   Procedure: FLEXIBLE BRONCHOSCOPY;  Surgeon: Tamea Dedra CROME, MD;  Location: [facility] ORS;  Service: Pulmonary;  Laterality: Left;  . FLEXIBLE BRONCHOSCOPY Left 04/30/2023   Procedure: BRONCHOSCOPY, FLEXIBLE;  Surgeon: Tamea Dedra CROME, MD;  Location: [facility] ORS;  Service: Pulmonary;  Laterality: Left;  . INTERCOSTAL NERVE BLOCK Left 02/03/2019   Procedure: INTERCOSTAL NERVE BLOCK WITH EXPAREL ;  Surgeon: Army Dallas NOVAK, MD;  Location: [facility] OR;  Service: Thoracic;  Laterality: Left;  . KNEE ARTHROSCOPY Right   . KNEE SURGERY Left    torn ligaments (staple in place)  . laceration of wrist Right   . LOBECTOMY Left 02/03/2019   Procedure: LEFT UPPER LOBECTOMY WITH LYMPH NODE DISSECTION;  Surgeon: Army Dallas NOVAK, MD;  [facility];  Service: Thoracic;  Laterality: Left;  . LUMBAR DISC SURGERY     ruptured disc  . PORT A CATH REVISION Left 08/05/2018   Procedure: PORT A CATH REVISION, REPOSITION LEFT;  Surgeon: Dessa Reyes ORN, MD;  Location: [facility] ORS;  Service: General;  Laterality: Left;  . PORT-A-CATH REMOVAL Right 12/02/2014   Procedure: REMOVAL PORT-A-CATH;  Surgeon: Lonni Brands, MD;  Location: [facility] ORS;  Service: General;  Laterality: Right;  . PORTACATH PLACEMENT    . PORTACATH PLACEMENT Left 07/29/2018   Procedure: INSERTION PORT-A-CATH LEFT;  Surgeon: Dessa Reyes ORN, MD;  Location: [facility] ORS;  Service: General;  Laterality: Left;  . RECTAL BIOPSY  04/30/2013  . RIGID BRONCHOSCOPY Left 08/09/2022   Procedure: RIGID BRONCHOSCOPY;   Surgeon: Tamea Dedra CROME, MD;  Location: [facility] ORS;  Service: Pulmonary;  Laterality: Left;  . SPINE SURGERY  1989  . THORACOTOMY/LOBECTOMY Left 02/03/2019   Procedure: MINITHORACOTOMY;  Surgeon: Army Dallas NOVAK, MD;  Location: [facility] OR;  Service: Thoracic;  Laterality: Left;  SABRA VIDEO ASSISTED THORACOSCOPY (VATS)/WEDGE RESECTION Left 02/03/2019   Procedure: VIDEO ASSISTED THORACOSCOPY (VATS)/LUNG RESECTION;  Surgeon: Army Dallas NOVAK, MD;  Location: [facility] OR;  Service: Thoracic;  Laterality: Left;  SABRA VIDEO BRONCHOSCOPY N/A 02/03/2019   Procedure: VIDEO BRONCHOSCOPY;  Surgeon: Army Dallas NOVAK, MD;  Location: [facility] OR;  Service: Thoracic;  Laterality: N/A;  . VIDEO BRONCHOSCOPY N/A 10/28/2021   Procedure: VIDEO BRONCHOSCOPY WITH CRYO and DEBULKING;  Surgeon: Tamea Dedra CROME, MD;  Location: [facility] ORS;  Service: Cardiopulmonary;  Laterality: N/A;  . VIDEO BRONCHOSCOPY WITH ENDOBRONCHIAL ULTRASOUND N/A 12/30/2018   Procedure: VIDEO BRONCHOSCOPY WITH ENDOBRONCHIAL ULTRASOUND;  Surgeon: Army Dallas NOVAK, MD;  Location: [facility] OR;  Service: Thoracic;  Laterality: N/A;  . XI ROBOT ABDOMINAL PERINEAL RESECTION  10/09/2013    FAMILY HISTORY: Father with prostate cancer.  COPD, CHF.     ADVANCED DIRECTIVES:    HEALTH MAINTENANCE: Social History   Tobacco Use  . Smoking status: Former    Types: Cigars  . Smokeless tobacco: Current    Types: Chew  .  Tobacco comments:    No Cigars. Does Dip. Khj 02/16/2023  Vaping Use  . Vaping status: Never Used  Substance Use Topics  . Alcohol use: Yes    Comment: Occassional beer  . Drug use: Never     Colonoscopy:  PAP:  Bone density:  Lipid panel:  Allergies  Allergen Reactions  . Sulfa Antibiotics Hives  . Gabapentin  Diarrhea  . Morphine  And Codeine Other (See Comments)    Altered mental status    Current Outpatient Medications  Medication Sig Dispense Refill  . acetaminophen  (TYLENOL ) 500 MG tablet Take 1,000 mg by mouth every 6 (six)  hours as needed for mild pain.    . Albuterol -Budesonide  (AIRSUPRA) 90-80 MCG/ACT AERO Inhale 2 puffs into the lungs every 6 (six) hours as needed. 10.7 g 2  . amLODipine  (NORVASC ) 10 MG tablet Take 1 tablet (10 mg total) by mouth daily. 90 tablet 3  . calcium  carbonate (TUMS - DOSED IN MG ELEMENTAL CALCIUM ) 500 MG chewable tablet Chew 2 tablets by mouth as needed for indigestion or heartburn.    . cetirizine (ZYRTEC) 10 MG tablet Take 10 mg by mouth at bedtime.     . cyclobenzaprine  (FLEXERIL ) 10 MG tablet TAKE 1 TABLET BY MOUTH THREE TIMES A DAY AS NEEDED FOR MUSCLE SPASM 90 tablet 3  . loperamide  (IMODIUM ) 2 MG capsule Take 4 mg by mouth as needed for diarrhea or loose stools.     . tadalafil  (CIALIS ) 20 MG tablet TAKE 1 TAB BY MOUTH 1 HOUR PRIOR TO INTERCOURSE AS NEEDED 30 tablet 3  . WIXELA INHUB 250-50 MCG/ACT AEPB INHALE 1 PUFF INTO THE LUNGS IN THE MORNING AND AT BEDTIME. 60 each 5  . Multiple Vitamin (MULTIVITAMIN WITH MINERALS) TABS tablet Take 1 tablet by mouth every evening.  (Patient not taking: Reported on 11/07/2023)     No current facility-administered medications for this visit.   Facility-Administered Medications Ordered in Other Visits  Medication Dose Route Frequency Provider Last Rate Last Admin  . heparin  lock flush 100 unit/mL  500 Units Intravenous Once Nitzia Perren J, MD      . sodium chloride  flush (NS) 0.9 % injection 10 mL  10 mL Intravenous Once Marquis Down J, MD        OBJECTIVE: Vitals:   11/07/23 1035 11/07/23 1044  BP: (!) 145/74 (!) 148/92  Pulse: 94   Resp: 16   Temp: 98.5 F (36.9 C)   SpO2: 100%      Body mass index is 34.08 kg/m.    ECOG FS:0 - Asymptomatic  General: Well-developed, well-nourished, no acute distress. HEENT: Normocephalic. Neuro: Alert, answering all questions appropriately. Cranial nerves grossly intact. Psych: Normal affect.  LAB RESULTS:  Lab Results  Component Value Date   NA 137 10/25/2023   K 4.0  10/25/2023   CL 107 10/25/2023   CO2 22 10/25/2023   GLUCOSE 107 (H) 10/25/2023   BUN 29 (H) 10/25/2023   CREATININE 1.18 10/25/2023   CALCIUM  9.0 10/25/2023   PROT 7.1 10/25/2023   ALBUMIN  4.1 10/25/2023   AST 21 10/25/2023   ALT 20 10/25/2023   ALKPHOS 65 10/25/2023   BILITOT 0.6 10/25/2023   GFRNONAA >60 10/25/2023   GFRAA >60 09/16/2019    Lab Results  Component Value Date   WBC 4.6 10/25/2023   NEUTROABS 3.1 10/25/2023   HGB 10.7 (L) 10/25/2023   HCT 33.7 (L) 10/25/2023   MCV 76.9 (L) 10/25/2023   PLT 464 (H) 10/25/2023  STUDIES: CT CHEST ABDOMEN PELVIS W CONTRAST Result Date: 10/26/2023 CLINICAL DATA:  Follow-up rectal cancer.  * Tracking Code: BO * EXAM: CT CHEST, ABDOMEN, AND PELVIS WITH CONTRAST TECHNIQUE: Multidetector CT imaging of the chest, abdomen and pelvis was performed following the standard protocol during bolus administration of intravenous contrast. RADIATION DOSE REDUCTION: This exam was performed according to the departmental dose-optimization program which includes automated exposure control, adjustment of the mA and/or kV according to patient size and/or use of iterative reconstruction technique. CONTRAST:  OMNIPAQUE  IOHEXOL  300 MG/ML  SOLN COMPARISON:  Multiple priors including CT April 09, 2023 FINDINGS: CT CHEST FINDINGS Cardiovascular: Accessed left chest Port-A-Cath with tip near the superior cavoatrial junction. Aortic atherosclerosis. Normal size heart. No significant pericardial effusion/thickening. Mediastinum/Nodes: No suspicious thyroid  nodule. No pathologically enlarged mediastinal, hilar or axillary lymph nodes. The esophagus is grossly unremarkable. Lungs/Pleura: Stable 5 mm left lower lobe pulmonary nodule on image 116/3. The other 5 mm nodule which was new on prior examination has resolved. Additional scattered tiny pulmonary nodules are stable for instance a 4 mm left lower lobe pulmonary nodule on image 132/3 previously measuring 5 mm.  Musculoskeletal: No aggressive lytic or blastic lesion of bone. Thoracic spondylosis. Healed remote left fifth rib fracture. CT ABDOMEN PELVIS FINDINGS Hepatobiliary: No suspicious hepatic lesion. Gallbladder surgically absent. No biliary ductal dilation. Pancreas: No pancreatic ductal dilation or evidence of acute inflammation. Spleen: No splenomegaly. Adrenals/Urinary Tract: No suspicious adrenal nodule/mass. No hydronephrosis. Kidneys demonstrate symmetric enhancement. Urinary bladder is unremarkable for degree of distension. Stomach/Bowel: Stomach is unremarkable for degree of distension. No pathologic dilation of small or large bowel. Colonic diverticulosis. Prior partial rectosigmoid resection with left anterior abdominal wall colostomy formation. Similar soft tissue and fluid in the mesorectal/presacral space measuring 5.5 x 5.3 cm on image 119/2 previously 5.4 x 5.6 cm. Vascular/Lymphatic: Normal caliber abdominal aorta. No pathologically enlarged abdominal or pelvic lymph nodes. Reproductive: Unremarkable. Other: No significant abdominopelvic free fluid. Musculoskeletal: No aggressive lytic or blastic lesion of bone. Transitional lumbosacral anatomy. Lumbar spondylosis. IMPRESSION: 1. Prior partial rectosigmoid resection with left anterior abdominal wall colostomy formation. Similar soft tissue and fluid in the mesorectal/presacral space measuring 5.5 x 5.3 cm previously 5.4 x 5.6 cm. 2. Stable 5 mm left lower lobe pulmonary nodule. The other 5 mm nodule which was new on prior examination has resolved. Additional scattered tiny pulmonary nodules are stable. 3. No evidence of new or progressive disease in the chest, abdomen or pelvis. 4. Aortic atherosclerosis. Aortic Atherosclerosis (ICD10-I70.0). Electronically Signed   By: Reyes Holder M.D.   On: 10/26/2023 08:53    ONCOLOGY HISTORY:  Patient was initially considered a clinical stage IIIa, but after completing neoadjuvant 5-FU and XRT followed by  surgical excision on October 08, 2013 he was noted to be pathologic stage Ia.  He declined adjuvant FOLFOX at that time.  More recently, patient's CEA started trending up and CT of the chest revealed a large 7.7 cm left upper lobe mass biopsy consistent with rectal adenocarcinoma.  Patient completed cycle 6 of FOLFOX plus Avastin  on October 16, 2018.  Repeat biopsy did not reveal any evidence of malignancy, but persistent fungal infection.  Patient underwent repeat bronchoscopy on December 30, 2018 which did in fact reveal residual malignancy.  He subsequently underwent surgical resection on February 03, 2019 with complete removal of residual disease.  One lymph node had a fragment of malignancy.  Patient did not require additional adjuvant chemotherapy or XRT.   ASSESSMENT: Recurrent,  stage IV rectal adenocarcinoma.  PLAN:    Recurrent, stage IV rectal adenocarcinoma: See oncology history as above.  No evidence of disease.  Patient's most recent CT scan on April 09, 2023 reviewed independently and reported as above with no obvious evidence of recurrent or progressive disease.  Patient reports he is having a bronchoscopy with possible cryo therapy in approximately 1 week.  CEA remains within normal limits.  No intervention is needed at this time.  Will continue evaluation and imaging every 6 months until patient is 5 years removed from his surgery at which point we will transition to yearly visits.  Continue follow-up with pulmonology as indicated.  Return to clinic in 6 months with repeat imaging video-assisted telemedicine visit.  Cough/shortness of breath: Significantly improved after ablation of granulation tissue by pulmonary.  Continue follow-up with pulmonary as indicated. Elevated PSA: Patient is going to monitor his external testosterone  use.  Follow-up with urology as indicated. Pulmonary nodule: Patient noted to have a 5 mm left lower lobe nodule, likely infectious given his recent bronchitis.   Repeat CT scan in 6 months as above.  I provided 20 minutes of face-to-face video visit time during this encounter which included chart review, counseling, and coordination of care as documented above.     Patient expressed understanding and was in agreement with this plan. He also understands that He can call clinic at any time with any questions, concerns, or complaints.    Evalene JINNY Reusing, MD   11/07/2023 10:55 AM

## 2023-11-08 ENCOUNTER — Other Ambulatory Visit: Payer: Self-pay | Admitting: *Deleted

## 2023-11-08 ENCOUNTER — Encounter: Payer: Self-pay | Admitting: Oncology

## 2023-11-08 DIAGNOSIS — D509 Iron deficiency anemia, unspecified: Secondary | ICD-10-CM

## 2023-11-08 DIAGNOSIS — C20 Malignant neoplasm of rectum: Secondary | ICD-10-CM

## 2023-11-12 ENCOUNTER — Inpatient Hospital Stay

## 2023-11-12 VITALS — BP 153/78 | HR 95 | Temp 98.0°F | Resp 18

## 2023-11-12 DIAGNOSIS — D509 Iron deficiency anemia, unspecified: Secondary | ICD-10-CM

## 2023-11-12 DIAGNOSIS — D508 Other iron deficiency anemias: Secondary | ICD-10-CM | POA: Diagnosis not present

## 2023-11-12 MED ORDER — IRON SUCROSE 20 MG/ML IV SOLN
200.0000 mg | Freq: Once | INTRAVENOUS | Status: AC
  Administered 2023-11-12: 200 mg via INTRAVENOUS
  Filled 2023-11-12: qty 10

## 2023-11-12 NOTE — Patient Instructions (Signed)

## 2023-11-13 DIAGNOSIS — Z933 Colostomy status: Secondary | ICD-10-CM | POA: Diagnosis not present

## 2023-11-14 ENCOUNTER — Inpatient Hospital Stay

## 2023-11-14 ENCOUNTER — Ambulatory Visit: Admitting: Pulmonary Disease

## 2023-11-16 ENCOUNTER — Inpatient Hospital Stay

## 2023-11-16 VITALS — BP 148/85 | HR 92 | Temp 98.3°F | Resp 18

## 2023-11-16 DIAGNOSIS — D509 Iron deficiency anemia, unspecified: Secondary | ICD-10-CM

## 2023-11-16 DIAGNOSIS — D508 Other iron deficiency anemias: Secondary | ICD-10-CM | POA: Diagnosis not present

## 2023-11-16 MED ORDER — SODIUM CHLORIDE 0.9% FLUSH
10.0000 mL | Freq: Once | INTRAVENOUS | Status: AC | PRN
  Administered 2023-11-16: 10 mL
  Filled 2023-11-16: qty 10

## 2023-11-16 MED ORDER — IRON SUCROSE 20 MG/ML IV SOLN
200.0000 mg | Freq: Once | INTRAVENOUS | Status: AC
  Administered 2023-11-16: 200 mg via INTRAVENOUS

## 2023-11-16 NOTE — Patient Instructions (Signed)

## 2023-11-19 ENCOUNTER — Inpatient Hospital Stay

## 2023-11-19 VITALS — BP 137/73 | HR 89 | Temp 96.9°F | Resp 18

## 2023-11-19 DIAGNOSIS — D509 Iron deficiency anemia, unspecified: Secondary | ICD-10-CM

## 2023-11-19 DIAGNOSIS — D508 Other iron deficiency anemias: Secondary | ICD-10-CM | POA: Diagnosis not present

## 2023-11-19 MED ORDER — IRON SUCROSE 20 MG/ML IV SOLN
200.0000 mg | Freq: Once | INTRAVENOUS | Status: AC
  Administered 2023-11-19: 200 mg via INTRAVENOUS
  Filled 2023-11-19: qty 10

## 2023-11-19 NOTE — Patient Instructions (Signed)

## 2023-11-20 ENCOUNTER — Inpatient Hospital Stay

## 2023-11-21 ENCOUNTER — Inpatient Hospital Stay

## 2023-11-21 VITALS — BP 154/84 | HR 92 | Temp 96.0°F | Resp 18

## 2023-11-21 DIAGNOSIS — D508 Other iron deficiency anemias: Secondary | ICD-10-CM | POA: Diagnosis not present

## 2023-11-21 DIAGNOSIS — D509 Iron deficiency anemia, unspecified: Secondary | ICD-10-CM

## 2023-11-21 MED ORDER — IRON SUCROSE 20 MG/ML IV SOLN
200.0000 mg | Freq: Once | INTRAVENOUS | Status: AC
  Administered 2023-11-21: 200 mg via INTRAVENOUS
  Filled 2023-11-21: qty 10

## 2023-11-23 ENCOUNTER — Inpatient Hospital Stay

## 2023-11-23 VITALS — BP 140/78 | HR 81 | Temp 96.5°F | Resp 18

## 2023-11-23 DIAGNOSIS — D509 Iron deficiency anemia, unspecified: Secondary | ICD-10-CM

## 2023-11-23 DIAGNOSIS — D508 Other iron deficiency anemias: Secondary | ICD-10-CM | POA: Diagnosis not present

## 2023-11-23 MED ORDER — IRON SUCROSE 20 MG/ML IV SOLN
200.0000 mg | Freq: Once | INTRAVENOUS | Status: AC
  Administered 2023-11-23: 200 mg via INTRAVENOUS
  Filled 2023-11-23: qty 10

## 2023-11-23 MED ORDER — SODIUM CHLORIDE 0.9% FLUSH
10.0000 mL | Freq: Once | INTRAVENOUS | Status: AC | PRN
  Administered 2023-11-23: 10 mL
  Filled 2023-11-23: qty 10

## 2023-11-26 ENCOUNTER — Encounter: Payer: Self-pay | Admitting: Pulmonary Disease

## 2023-11-26 DIAGNOSIS — J45909 Unspecified asthma, uncomplicated: Secondary | ICD-10-CM

## 2023-11-27 MED ORDER — PREDNISONE 20 MG PO TABS: 20.0000 mg | ORAL_TABLET | Freq: Every day | ORAL | 0 refills | Status: AC

## 2023-11-27 MED ORDER — AZITHROMYCIN 250 MG PO TABS: 250.0000 mg | ORAL_TABLET | Freq: Every day | ORAL | 0 refills | Status: DC

## 2023-11-27 NOTE — Telephone Encounter (Signed)
 Noted, NFN

## 2023-12-12 ENCOUNTER — Other Ambulatory Visit: Payer: Self-pay

## 2023-12-12 ENCOUNTER — Other Ambulatory Visit (HOSPITAL_COMMUNITY): Payer: Self-pay

## 2023-12-12 ENCOUNTER — Observation Stay (HOSPITAL_COMMUNITY)
Admission: EM | Admit: 2023-12-12 | Discharge: 2023-12-14 | Disposition: A | Discharge status: Home / Self Care | Attending: Gastroenterology | Admitting: Gastroenterology

## 2023-12-12 ENCOUNTER — Encounter: Payer: Self-pay | Admitting: Oncology

## 2023-12-12 ENCOUNTER — Emergency Department (HOSPITAL_COMMUNITY)

## 2023-12-12 DIAGNOSIS — Z79899 Other long term (current) drug therapy: Secondary | ICD-10-CM | POA: Insufficient documentation

## 2023-12-12 DIAGNOSIS — K5730 Diverticulosis of large intestine without perforation or abscess without bleeding: Secondary | ICD-10-CM | POA: Insufficient documentation

## 2023-12-12 DIAGNOSIS — K2901 Acute gastritis with bleeding: Secondary | ICD-10-CM | POA: Diagnosis not present

## 2023-12-12 DIAGNOSIS — Z85048 Personal history of other malignant neoplasm of rectum, rectosigmoid junction, and anus: Secondary | ICD-10-CM | POA: Insufficient documentation

## 2023-12-12 DIAGNOSIS — R58 Hemorrhage, not elsewhere classified: Secondary | ICD-10-CM | POA: Diagnosis not present

## 2023-12-12 DIAGNOSIS — K9401 Colostomy hemorrhage: Secondary | ICD-10-CM | POA: Diagnosis not present

## 2023-12-12 DIAGNOSIS — D62 Acute posthemorrhagic anemia: Secondary | ICD-10-CM | POA: Diagnosis not present

## 2023-12-12 DIAGNOSIS — K2289 Other specified disease of esophagus: Secondary | ICD-10-CM | POA: Diagnosis not present

## 2023-12-12 DIAGNOSIS — K922 Gastrointestinal hemorrhage, unspecified: Secondary | ICD-10-CM | POA: Diagnosis not present

## 2023-12-12 DIAGNOSIS — D500 Iron deficiency anemia secondary to blood loss (chronic): Secondary | ICD-10-CM | POA: Insufficient documentation

## 2023-12-12 DIAGNOSIS — Z87891 Personal history of nicotine dependence: Secondary | ICD-10-CM | POA: Insufficient documentation

## 2023-12-12 DIAGNOSIS — K229 Disease of esophagus, unspecified: Secondary | ICD-10-CM | POA: Diagnosis not present

## 2023-12-12 DIAGNOSIS — I10 Essential (primary) hypertension: Secondary | ICD-10-CM | POA: Insufficient documentation

## 2023-12-12 DIAGNOSIS — Z85038 Personal history of other malignant neoplasm of large intestine: Secondary | ICD-10-CM

## 2023-12-12 DIAGNOSIS — D509 Iron deficiency anemia, unspecified: Secondary | ICD-10-CM | POA: Diagnosis not present

## 2023-12-12 DIAGNOSIS — B3781 Candidal esophagitis: Secondary | ICD-10-CM

## 2023-12-12 DIAGNOSIS — R935 Abnormal findings on diagnostic imaging of other abdominal regions, including retroperitoneum: Secondary | ICD-10-CM | POA: Diagnosis not present

## 2023-12-12 DIAGNOSIS — J4489 Other specified chronic obstructive pulmonary disease: Secondary | ICD-10-CM | POA: Diagnosis not present

## 2023-12-12 DIAGNOSIS — K921 Melena: Secondary | ICD-10-CM

## 2023-12-12 DIAGNOSIS — K2971 Gastritis, unspecified, with bleeding: Principal | ICD-10-CM

## 2023-12-12 DIAGNOSIS — D122 Benign neoplasm of ascending colon: Principal | ICD-10-CM | POA: Insufficient documentation

## 2023-12-12 LAB — I-STAT CHEM 8, ED
BUN: 33 mg/dL — ABNORMAL HIGH (ref 8–23)
Calcium, Ion: 1.33 mmol/L (ref 1.15–1.40)
Chloride: 112 mmol/L — ABNORMAL HIGH (ref 98–111)
Creatinine, Ser: 1 mg/dL (ref 0.61–1.24)
Glucose, Bld: 110 mg/dL — ABNORMAL HIGH (ref 70–99)
HCT: 28 % — ABNORMAL LOW (ref 39.0–52.0)
Hemoglobin: 9.5 g/dL — ABNORMAL LOW (ref 13.0–17.0)
Potassium: 4.2 mmol/L (ref 3.5–5.1)
Sodium: 143 mmol/L (ref 135–145)
TCO2: 20 mmol/L — ABNORMAL LOW (ref 22–32)

## 2023-12-12 LAB — HEMOGLOBIN AND HEMATOCRIT, BLOOD
HCT: 26.1 % — ABNORMAL LOW (ref 39.0–52.0)
HCT: 24.9 % — ABNORMAL LOW (ref 39.0–52.0)
Hemoglobin: 7.8 g/dL — ABNORMAL LOW (ref 13.0–17.0)
Hemoglobin: 7.7 g/dL — ABNORMAL LOW (ref 13.0–17.0)

## 2023-12-12 LAB — COMPREHENSIVE METABOLIC PANEL WITH GFR
ALT: 17 U/L (ref 0–44)
AST: 18 U/L (ref 15–41)
Albumin: 3.4 g/dL — ABNORMAL LOW (ref 3.5–5.0)
Alkaline Phosphatase: 52 U/L (ref 38–126)
Anion gap: 12 (ref 5–15)
BUN: 33 mg/dL — ABNORMAL HIGH (ref 8–23)
CO2: 19 mmol/L — ABNORMAL LOW (ref 22–32)
Calcium: 9.9 mg/dL (ref 8.9–10.3)
Chloride: 111 mmol/L (ref 98–111)
Creatinine, Ser: 0.83 mg/dL (ref 0.61–1.24)
GFR, Estimated: >60 mL/min (ref >60)
Glucose, Bld: 120 mg/dL — ABNORMAL HIGH (ref 70–99)
Potassium: 4.2 mmol/L (ref 3.5–5.1)
Sodium: 142 mmol/L (ref 135–145)
Total Bilirubin: 0.4 mg/dL (ref 0.0–1.2)
Total Protein: 5.7 g/dL — ABNORMAL LOW (ref 6.5–8.1)

## 2023-12-12 LAB — CBC
HCT: 29.7 % — ABNORMAL LOW (ref 39.0–52.0)
Hemoglobin: 9.1 g/dL — ABNORMAL LOW (ref 13.0–17.0)
MCH: 26.1 pg (ref 26.0–34.0)
MCHC: 30.6 g/dL (ref 30.0–36.0)
MCV: 85.3 fL (ref 80.0–100.0)
Platelets: 402 K/uL — ABNORMAL HIGH (ref 150–400)
RBC: 3.48 MIL/uL — ABNORMAL LOW (ref 4.22–5.81)
RDW: 21.5 % — ABNORMAL HIGH (ref 11.5–15.5)
WBC: 6.2 K/uL (ref 4.0–10.5)
nRBC: 0 % (ref 0.0–0.2)

## 2023-12-12 LAB — TYPE AND SCREEN
ABO/RH(D): O NEG
Antibody Screen: NEGATIVE

## 2023-12-12 LAB — HIV ANTIBODY (ROUTINE TESTING W REFLEX): HIV Screen 4th Generation wRfx: NONREACTIVE

## 2023-12-12 MED ORDER — FLUTICASONE FUROATE-VILANTEROL 200-25 MCG/ACT IN AEPB
1.0000 | INHALATION_SPRAY | Freq: Every day | RESPIRATORY_TRACT | Status: DC
  Administered 2023-12-13: 1 via RESPIRATORY_TRACT
  Filled 2023-12-12: qty 28

## 2023-12-12 MED ORDER — SODIUM CHLORIDE 0.9 % IV SOLN: INTRAVENOUS | Status: AC

## 2023-12-12 MED ORDER — IPRATROPIUM-ALBUTEROL 0.5-2.5 (3) MG/3ML IN SOLN: 3.0000 mL | Freq: Four times a day (QID) | RESPIRATORY_TRACT | Status: DC | PRN

## 2023-12-12 MED ORDER — NA SULFATE-K SULFATE-MG SULF 17.5-3.13-1.6 GM/177ML PO SOLN
0.5000 | Freq: Once | ORAL | Status: AC
  Administered 2023-12-13: 177 mL via ORAL
  Filled 2023-12-12 (×2): qty 1

## 2023-12-12 MED ORDER — CHLORHEXIDINE GLUCONATE CLOTH 2 % EX PADS
6.0000 | MEDICATED_PAD | Freq: Every day | CUTANEOUS | Status: DC
  Administered 2023-12-13 – 2023-12-14 (×2): 6 via TOPICAL

## 2023-12-12 MED ORDER — AMLODIPINE BESYLATE 5 MG PO TABS: 10.0000 mg | ORAL_TABLET | Freq: Every day | ORAL | Status: DC

## 2023-12-12 MED ORDER — SODIUM CHLORIDE 0.9% FLUSH
10.0000 mL | Freq: Two times a day (BID) | INTRAVENOUS | Status: DC
  Administered 2023-12-12 – 2023-12-14 (×5): 10 mL

## 2023-12-12 MED ORDER — ALTEPLASE 2 MG IJ SOLR
2.0000 mg | Freq: Once | INTRAMUSCULAR | Status: AC
  Administered 2023-12-12: 2 mg
  Filled 2023-12-12: qty 2

## 2023-12-12 MED ORDER — SIMETHICONE 80 MG PO CHEW
240.0000 mg | CHEWABLE_TABLET | Freq: Once | ORAL | Status: AC
  Administered 2023-12-12: 240 mg via ORAL
  Filled 2023-12-12: qty 3

## 2023-12-12 MED ORDER — PANTOPRAZOLE SODIUM 40 MG IV SOLR
40.0000 mg | Freq: Two times a day (BID) | INTRAVENOUS | Status: DC
  Administered 2023-12-12 – 2023-12-13 (×4): 40 mg via INTRAVENOUS
  Filled 2023-12-12 (×4): qty 10

## 2023-12-12 MED ORDER — NA SULFATE-K SULFATE-MG SULF 17.5-3.13-1.6 GM/177ML PO SOLN
0.5000 | Freq: Once | ORAL | Status: DC
  Filled 2023-12-12 (×2): qty 1

## 2023-12-12 MED ORDER — SIMETHICONE 80 MG PO CHEW: 240.0000 mg | CHEWABLE_TABLET | Freq: Once | ORAL | Status: DC

## 2023-12-12 MED ORDER — IOHEXOL 350 MG/ML SOLN
75.0000 mL | Freq: Once | INTRAVENOUS | Status: AC | PRN
  Administered 2023-12-12: 75 mL via INTRAVENOUS

## 2023-12-12 NOTE — ED Provider Notes (Signed)
 [name] [name] Provider Note   CSN: [id number] Arrival date & time: 12/12/23  9462     Patient presents with: GI Problem   [name] [name] is a 68 y.o. male.   Patient complains of bleeding into his colostomy bag.  Patient has a past medical history of colon and lung cancer he had a colostomy bag placed in 2015.  He states that he has been putting out bright red blood and stool.  Patient reports that he has filled 2 bags 1 bag was mostly blood.  Patient denies any other complaints he has not had any fever he has not had any chills.  He is having some abdominal discomfort but no severe pain.  Patient reports he was recently hospitalized for a transfusion because his hemoglobin dropped.Patient is here with his wife who is supportive  The history is provided by the patient and the spouse. No language interpreter was used.  GI Problem       Prior to Admission medications   Medication Sig Start Date End Date Taking? Authorizing Provider  acetaminophen  (TYLENOL ) 500 MG tablet Take 1,000 mg by mouth every 6 (six) hours as needed for mild pain.    [provider]  Albuterol -Budesonide  (AIRSUPRA ) 90-80 MCG/ACT AERO Inhale 2 puffs into the lungs every 6 (six) hours as needed. 11/01/23   Tamea Dedra CROME, MD  amLODipine  (NORVASC ) 10 MG tablet Take 1 tablet (10 mg total) by mouth daily. 10/02/23   Sowles, Krichna, MD  azithromycin  (ZITHROMAX ) 250 MG tablet Take 1 tablet (250 mg total) by mouth daily. 11/27/23   Assaker, Darrin, MD  calcium  carbonate (TUMS - DOSED IN MG ELEMENTAL CALCIUM ) 500 MG chewable tablet Chew 2 tablets by mouth as needed for indigestion or heartburn.    [provider]  cetirizine (ZYRTEC) 10 MG tablet Take 10 mg by mouth at bedtime.     [provider]  cyclobenzaprine  (FLEXERIL ) 10 MG tablet TAKE 1 TABLET BY MOUTH THREE TIMES A DAY AS NEEDED FOR MUSCLE SPASM 10/02/23   Sowles, Krichna, MD  loperamide   (IMODIUM ) 2 MG capsule Take 4 mg by mouth as needed for diarrhea or loose stools.     [provider]  Multiple Vitamin (MULTIVITAMIN WITH MINERALS) TABS tablet Take 1 tablet by mouth every evening.  Patient not taking: Reported on 11/07/2023    [provider]  tadalafil  (CIALIS ) 20 MG tablet TAKE 1 TAB BY MOUTH 1 HOUR PRIOR TO INTERCOURSE AS NEEDED 10/15/21 11/07/23  Stoioff, Glendia BROCKS, MD  NAPOLEON INHUB 250-50 MCG/ACT AEPB INHALE 1 PUFF INTO THE LUNGS IN THE MORNING AND AT BEDTIME. 09/07/23   Tamea Dedra CROME, MD    Allergies: Sulfa antibiotics, Gabapentin , and Morphine  and codeine    Review of Systems  Gastrointestinal:  Positive for blood in stool.  All other systems reviewed and are negative.   Updated Vital Signs BP 138/77 (BP Location: Right Arm)   Pulse (!) 116   Temp 98.2 F (36.8 C)   Resp 19   SpO2 100%   Physical Exam Vitals and nursing note reviewed.  Constitutional:      Appearance: He is well-developed.  HENT:     Head: Normocephalic.     Nose: Nose normal.     Mouth/Throat:     Mouth: Mucous membranes are moist.  Cardiovascular:     Rate and Rhythm: Normal rate.  Pulmonary:     Effort: Pulmonary effort is normal.  Abdominal:  General: Abdomen is flat. There is no distension.  Musculoskeletal:        General: Normal range of motion.     Cervical back: Normal range of motion.  Skin:    General: Skin is warm.  Neurological:     General: No focal deficit present.     Mental Status: He is alert and oriented to person, place, and time.  Psychiatric:        Mood and Affect: Mood normal.     (all labs ordered are listed, but only abnormal results are displayed) Labs Reviewed  CBC - Abnormal; Notable for the following components:      Result Value   RBC 3.48 (*)    Hemoglobin 9.1 (*)    HCT 29.7 (*)    RDW 21.5 (*)    Platelets 402 (*)    All other components within normal limits  I-STAT CHEM 8, ED - Abnormal; Notable for the  following components:   Chloride 112 (*)    BUN 33 (*)    Glucose, Bld 110 (*)    TCO2 20 (*)    Hemoglobin 9.5 (*)    HCT 28.0 (*)    All other components within normal limits  COMPREHENSIVE METABOLIC PANEL WITH GFR  TYPE AND SCREEN    EKG: None  Radiology: CT Angio Abd/Pel W and/or Wo Contrast Result Date: 12/12/2023 EXAM: CTA ABDOMEN AND PELVIS WITHOUT AND WITH CONTRAST 12/12/2023 07:19:49 AM TECHNIQUE: CTA images of the abdomen and pelvis without and with intravenous contrast (iohexol  (OMNIPAQUE ) 350 MG/ML injection 75 mL IOHEXOL  350 MG/ML SOLN). Three-dimensional MIP/volume rendered formations were performed. Automated exposure control, iterative reconstruction, and/or weight based adjustment of the mA/kV was utilized to reduce the radiation dose to as low as reasonably achievable. COMPARISON: 10/25/2023 CLINICAL HISTORY: Lower GI bleed; red blood in colostomy. FINDINGS: VASCULATURE: No definite evidence of contrast extravasation is noted to suggest gastrointestinal bleeding. AORTA: No acute finding. No abdominal aortic aneurysm. No dissection. CELIAC TRUNK: No acute finding. No occlusion or significant stenosis. SUPERIOR MESENTERIC ARTERY: No acute finding. No occlusion or significant stenosis. RENAL ARTERIES: No acute finding. No occlusion or significant stenosis. ILIAC ARTERIES: No acute finding. No occlusion or significant stenosis. LIVER: The liver is unremarkable. GALLBLADDER AND BILE DUCTS: Status post cholecystectomy. No biliary ductal dilatation. SPLEEN: The spleen is unremarkable. PANCREAS: The pancreas is unremarkable. ADRENAL GLANDS: Bilateral adrenal glands demonstrate no acute abnormality. KIDNEYS, URETERS AND BLADDER: Bilateral nonobstructive nephrolithiasis. No stones in the ureters. No hydronephrosis. No perinephric or periureteral stranding. Urinary bladder is unremarkable. GI AND BOWEL: Colostomy noted in left lower quadrant. Diverticulosis of the descending colon without  inflammation. 4.5 x 4.0 cm irregular mass seen in rectal region consistent with history of rectal malignancy. Stomach and duodenal sweep demonstrate no acute abnormality. There is no bowel obstruction. No abnormal bowel wall thickening or distension. REPRODUCTIVE: Reproductive organs are unremarkable. PERITONEUM AND RETRPERITONEUM: No ascites or free air. LUNG BASE: No acute abnormality. LYMPH NODES: No lymphadenopathy. BONES AND SOFT TISSUES: No acute abnormality of the bones. No acute soft tissue abnormality. IMPRESSION: 1. No definite evidence of active gastrointestinal bleeding on this CTA. 2. Irregular 4.5 x 4.0 cm rectal mass, compatible with known rectal malignancy. Electronically signed by: Lynwood Seip MD 12/12/2023 07:44 AM EST RP Workstation: [id number]     Procedures   Medications Ordered in the ED  sodium chloride  flush (NS) 0.9 % injection 10-40 mL (has no administration in time range)  Chlorhexidine  Gluconate Cloth 2 %  PADS 6 each (has no administration in time range)                                    Medical Decision Making Patient complains of bright red blood in his colostomy bag.  Amount and/or Complexity of Data Reviewed Independent Historian: spouse    Details: Patient's spouse is present and supportive Labs: ordered. Decision-making details documented in ED Course.    Details: Labs ordered reviewed and interpreted.  Hemoglobin is 9.1.  BUN is 33 Radiology: ordered and independent interpretation performed. Decision-making details documented in ED Course.    Details: Ct abdomen shows no definitive evidence of active GI bleeding, There is an irregularity of 4.5 x 4 cm rectal mass compatible with known malignancy Discussion of management or test interpretation with external provider(s): Hospitalist consulted and will admit  Risk OTC drugs. Prescription drug management. Decision regarding hospitalization. Risk Details: I discussed the patient with Dr. Aron general  surgery she advised consulting ostomy nurse and GI.  I spoke with Greig Corti GI provider on-call who will see the patient Medicine consulted to admit patient        Final diagnoses:  Gastrointestinal hemorrhage associated with gastritis, unspecified gastritis type    ED Discharge Orders     None          Flint Sonny POUR, [place] 12/12/23 [id number]    Rogelia Jerilynn RAMAN, MD 12/13/23 [phone]

## 2023-12-12 NOTE — ED Notes (Signed)
 Port will not pull back or flush freely, RN placed consult for IV team

## 2023-12-12 NOTE — Consult Note (Addendum)
 Consultation  Referring Provider: [facility]/Sophia PA-C Primary Care Physician:  Sowles, Krichna, MD Primary Gastroenterologist: Sampson  Reason for Consultation: GI bleeding, blood per ostomy  HPI: [name] [name] is a 68 y.o. male with history of stage IV rectal adenocarcinoma.  He was initially diagnosed in 2015 and underwent abdominal perineal resection with total mesorectal excision, rectum was then ligated, then left colon was mobilized and ostomy performed.  ( [facility]/Dr. Lundquist) .This was after he had done an initial course of radiation therapy.  He continues to be followed by oncology/Dr. Jacobo, found to have a left upper lobe lung mass in 2020 for which he underwent a course of chemotherapy, then a bronchoscopy was found to have residual disease and had a left upper lobe lobectomy in 2021.  He has been on surveillance since that time with serial CTs and CEA's which have been normal. He last had CT of the abdomen pelvis in October 2025 which did show his rectal adenocarcinoma to be stable as well as a stable 5 mm left lower lobe pulmonary nodule, no evidence of new or progressive disease in the chest abdomen or pelvis. CEA was ordered and pending Outpatient labs from 11/07/2023 show a drift in his hemoglobin to 10.7 at that time/hematocrit 33.7/MCV of 76.9 Ferritin 8/serum iron  21/TIBC 391/iron  sat 5.  He has just completed a course of 5 IV iron  infusions.  He says he awakened about 4 4 AM today after the dog had jumped on the bed-he noticed that his ostomy bag was full and initially thought this was gas but then noticed that there was blood in the ostomy bag.  He went into the bathroom and allowed to bag to drain into the commode and says that he passed what appeared to be a lot of red and dark red blood with the stool.  He had his wife look (retired engineer, civil (consulting)) and they both agree that this seems to be a significant amount of dark red blood.  He filled the bag again by the  time he had come to the emergency room with perhaps 300 cc of dark red blood.  Since then just small amounts of blood noted in the ostomy.  He has never noticed any frank blood from the ostomy in the past says his abdomen has not been bothering, he has not had any change in his bowel habits, occasionally has some loose stools for which he takes Imodium .  Otherwise has been feeling fine. He is not taking any regular aspirin or NSAIDs, no blood thinners. He has not had any surveillance colonoscopy since initial diagnosis in 2015.  CTA was done through the ER which was negative for any evidence of active bleeding, colostomy noted in in the left lower quadrant, diverticulosis present in the descending colon and a 4.5 x 4 cm irregular mass noted in the rectum consistent with history of rectal malignancy, the duodenal sweep appeared normal..  Labs today-WBC 6.2/hemoglobin 9.1/hematocrit 29.7/platelets 402 Sodium 142/potassium 4.2/BUN 33/creatinine 0.83 Albumin  3.4 LFTs within normal limits.  Patient is hemodynamically stable, and bleeding has slowed considerably/hopefully resolving at this time.  Past Medical History:  Diagnosis Date   Allergy    Anemia    Arthritis    Asthmatic bronchitis , chronic (HCC) 2025   Bronchial obstruction 07/2022   Bronchial stenosis, left 06/2022   Bronchial stenosis, left 2025   Cancer (HCC) 2015   rectal, followed by Oncology, permanent colostomy   Cancer of upper lobe of left lung (  HCC) 01/2019   Colon cancer (HCC)    Colostomy in place [name])    Dyslipidemia    Dyspnea    on exertion   Glaucoma    History of kidney stones    Hyperlipidemia    Hypertension    Mass of upper lobe of left lung 01/2019   Neuromuscular disorder (HCC)    Pneumonia    Rectal cancer (HCC) 2015   Seasonal allergies     Past Surgical History:  Procedure Laterality Date   CHOLECYSTECTOMY     COLON SURGERY     has a permanment ostomy   ENDOBRONCHIAL ULTRASOUND N/A  07/01/2018   Procedure: ENDOBRONCHIAL ULTRASOUND;  Surgeon: Tamea Dedra CROME, MD;  Location: [facility] ORS;  Service: Cardiopulmonary;  Laterality: N/A;   FLEXIBLE BRONCHOSCOPY Left 11/20/2018   Procedure: FLEXIBLE BRONCHOSCOPY;  Surgeon: Tamea Dedra CROME, MD;  Location: [facility];  Service: Cardiopulmonary;  Laterality: Left;   FLEXIBLE BRONCHOSCOPY Left 12/23/2021   Procedure: FLEXIBLE BRONCHOSCOPY;  Surgeon: Tamea Dedra CROME, MD;  Location: [facility];  Service: Pulmonary;  Laterality: Left;   FLEXIBLE BRONCHOSCOPY Left 08/09/2022   Procedure: FLEXIBLE BRONCHOSCOPY;  Surgeon: Tamea Dedra CROME, MD;  Location: [facility] OR[facility];  Service: Pulmonary;  Laterality: Left;   FLEXIBLE BRONCHOSCOPY Left 04/30/2023   Procedure: BRONCHOSCOPY, FLEXIBLE;  Surgeon: Tamea Dedra CROME, MD;  Location: [facility];  Service: Pulmonary;  Laterality: Left;   INTERCOSTAL NERVE BLOCK Left 02/03/2019   Procedure: INTERCOSTAL NERVE BLOCK WITH EXPAREL ;  Surgeon: Army Dallas NOVAK, MD;  Location: [facility] OR;  Service: Thoracic;  Laterality: Left;   KNEE ARTHROSCOPY Right    KNEE SURGERY Left    torn ligaments (staple in place)   laceration of wrist Right    LOBECTOMY Left 02/03/2019   Procedure: LEFT UPPER LOBECTOMY WITH LYMPH NODE DISSECTION;  Surgeon: Army Dallas NOVAK, MD;  [facility];  Service: Thoracic;  Laterality: Left;   LUMBAR DISC SURGERY     ruptured disc   PORT A CATH REVISION Left 08/05/2018   Procedure: PORT A CATH REVISION, REPOSITION LEFT;  Surgeon: Dessa Reyes ORN, MD;  Location: [facility];  Service: General;  Laterality: Left;   PORT-A-CATH REMOVAL Right 12/02/2014   Procedure: REMOVAL PORT-A-CATH;  Surgeon: Lonni Brands, MD;  Location: [facility];  Service: General;  Laterality: Right;   PORTACATH PLACEMENT     PORTACATH PLACEMENT Left 07/29/2018   Procedure: INSERTION PORT-A-CATH LEFT;  Surgeon: Dessa Reyes ORN, MD;  Location: [facility];  Service: General;  Laterality: Left;   RECTAL BIOPSY   04/30/2013   RIGID BRONCHOSCOPY Left 08/09/2022   Procedure: RIGID BRONCHOSCOPY;  Surgeon: Tamea Dedra CROME, MD;  Location: [facility];  Service: Pulmonary;  Laterality: Left;   SPINE SURGERY  1989   THORACOTOMY/LOBECTOMY Left 02/03/2019   Procedure: MINITHORACOTOMY;  Surgeon: Army Dallas NOVAK, MD;  Location[facility] OR;  Service: Thoracic;  Laterality: Left;   VIDEO ASSISTED THORACOSCOPY (VATS)/WEDGE RESECTION Left 02/03/2019   Procedure: VIDEO ASSISTED THORACOSCOPY (VATS)/LUNG RESECTION;  Surgeon: Army Dallas NOVAK, MD;  Location: [facility] OR;  Service: Thoracic;  Laterality: Left;   VIDEO BRONCHOSCOPY N/A 02/03/2019   Procedure: VIDEO BRONCHOSCOPY;  Surgeon: Army Dallas NOVAK, MD;  Location: [facility] OR;  Service: Thoracic;  Laterality: N/A;   VIDEO BRONCHOSCOPY N/A 10/28/2021   Procedure: VIDEO BRONCHOSCOPY WITH CRYO and DEBULKING;  Surgeon: Tamea Dedra CROME, MD;  Location: [facility] OR[facility];  Service: Cardiopulmonary;  Laterality: N/A;   VIDEO BRONCHOSCOPY WITH ENDOBRONCHIAL ULTRASOUND N/A 12/30/2018   Procedure:  VIDEO BRONCHOSCOPY WITH ENDOBRONCHIAL ULTRASOUND;  Surgeon: Army Dallas NOVAK, MD;  Location: [facility] OR;  Service: Thoracic;  Laterality: N/A;   XI ROBOT ABDOMINAL PERINEAL RESECTION  10/09/2013    Prior to Admission medications   Medication Sig Start Date End Date Taking? Authorizing Provider  acetaminophen  (TYLENOL ) 500 MG tablet Take 1,000 mg by mouth every 6 (six) hours as needed for mild pain.   Yes [provider]  Albuterol -Budesonide  (AIRSUPRA) 90-80 MCG/ACT AERO Inhale 2 puffs into the lungs every 6 (six) hours as needed. 11/01/23  Yes Tamea Dedra CROME, MD  amLODipine  (NORVASC ) 10 MG tablet Take 1 tablet (10 mg total) by mouth daily. 10/02/23  Yes Sowles, Krichna, MD  calcium  carbonate (TUMS - DOSED IN MG ELEMENTAL CALCIUM ) 500 MG chewable tablet Chew 2 tablets by mouth as needed for indigestion or heartburn.   Yes [provider]  cetirizine (ZYRTEC) 10 MG tablet Take 10 mg  by mouth at bedtime.    Yes [provider]  cyclobenzaprine  (FLEXERIL ) 10 MG tablet TAKE 1 TABLET BY MOUTH THREE TIMES A DAY AS NEEDED FOR MUSCLE SPASM Patient taking differently: Take 10 mg by mouth at bedtime. 10/02/23  Yes Sowles, Krichna, MD  loperamide  (IMODIUM ) 2 MG capsule Take 4 mg by mouth as needed for diarrhea or loose stools.    Yes [provider]  Multiple Vitamin (MULTIVITAMIN WITH MINERALS) TABS tablet Take 1 tablet by mouth every evening.   Yes [provider]  NAPOLEON INHUB 250-50 MCG/ACT AEPB INHALE 1 PUFF INTO THE LUNGS IN THE MORNING AND AT BEDTIME. 09/07/23  Yes Tamea Dedra CROME, MD  azithromycin  (ZITHROMAX ) 250 MG tablet Take 1 tablet (250 mg total) by mouth daily. Patient not taking: Reported on 12/12/2023 11/27/23   Malka Domino, MD  tadalafil  (CIALIS ) 20 MG tablet TAKE 1 TAB BY MOUTH 1 HOUR PRIOR TO INTERCOURSE AS NEEDED 10/15/21 11/07/23  Twylla Glendia BROCKS, MD    Current Facility-Administered Medications  Medication Dose Route Frequency Provider Last Rate Last Admin   0.9 %  sodium chloride  infusion   Intravenous Continuous Esterwood, Amy S, PA-C       0.9 %  sodium chloride  infusion   Intravenous Continuous Georgina Basket, MD       Chlorhexidine  Gluconate Cloth 2 % PADS 6 each  6 each Topical Daily Rogelia Jerilynn RAMAN, MD       fluticasone  furoate-vilanterol (BREO ELLIPTA) 200-25 MCG/ACT 1 puff  1 puff Inhalation Daily Georgina Basket, MD       ipratropium-albuterol  (DUONEB) 0.5-2.5 (3) MG/3ML nebulizer solution 3 mL  3 mL Nebulization Q6H PRN Georgina Basket, MD       Na Sulfate-K Sulfate-Mg Sulfate concentrate (SUPREP) kit 177 mL  0.5 kit Oral Once Esterwood, Amy S, PA-C       Followed by   Na Sulfate-K Sulfate-Mg Sulfate concentrate (SUPREP) kit 177 mL  0.5 kit Oral Once Esterwood, Amy S, PA-C       pantoprazole (PROTONIX) injection 40 mg  40 mg Intravenous Q12H Moore, Willie, MD   40 mg at 12/12/23 1050   simethicone  (MYLICON) chewable  tablet 240 mg  240 mg Oral Once Esterwood, Amy S, PA-C       Followed by   simethicone  (MYLICON) chewable tablet 240 mg  240 mg Oral Once Esterwood, Amy S, PA-C       sodium chloride  flush (NS) 0.9 % injection 10-40 mL  10-40 mL Intracatheter Q12H Rogelia Jerilynn RAMAN, MD   10 mL  at 12/12/23 1051   Current Outpatient Medications  Medication Sig Dispense Refill   acetaminophen  (TYLENOL ) 500 MG tablet Take 1,000 mg by mouth every 6 (six) hours as needed for mild pain.     Albuterol -Budesonide  (AIRSUPRA) 90-80 MCG/ACT AERO Inhale 2 puffs into the lungs every 6 (six) hours as needed. 10.7 g 2   amLODipine  (NORVASC ) 10 MG tablet Take 1 tablet (10 mg total) by mouth daily. 90 tablet 3   calcium  carbonate (TUMS - DOSED IN MG ELEMENTAL CALCIUM ) 500 MG chewable tablet Chew 2 tablets by mouth as needed for indigestion or heartburn.     cetirizine (ZYRTEC) 10 MG tablet Take 10 mg by mouth at bedtime.      cyclobenzaprine  (FLEXERIL ) 10 MG tablet TAKE 1 TABLET BY MOUTH THREE TIMES A DAY AS NEEDED FOR MUSCLE SPASM (Patient taking differently: Take 10 mg by mouth at bedtime.) 90 tablet 3   loperamide  (IMODIUM ) 2 MG capsule Take 4 mg by mouth as needed for diarrhea or loose stools.      Multiple Vitamin (MULTIVITAMIN WITH MINERALS) TABS tablet Take 1 tablet by mouth every evening.     WIXELA INHUB 250-50 MCG/ACT AEPB INHALE 1 PUFF INTO THE LUNGS IN THE MORNING AND AT BEDTIME. 60 each 5   azithromycin  (ZITHROMAX ) 250 MG tablet Take 1 tablet (250 mg total) by mouth daily. (Patient not taking: Reported on 12/12/2023) 6 tablet 0   tadalafil  (CIALIS ) 20 MG tablet TAKE 1 TAB BY MOUTH 1 HOUR PRIOR TO INTERCOURSE AS NEEDED 30 tablet 3   Facility-Administered Medications Ordered in Other Encounters  Medication Dose Route Frequency Provider Last Rate Last Admin   heparin  lock flush 100 unit/mL  500 Units Intravenous Once Finnegan, Timothy J, MD       sodium chloride  flush (NS) 0.9 % injection 10 mL  10 mL Intravenous Once  Finnegan, Timothy J, MD        Allergies as of 12/12/2023 - Review Complete 12/12/2023  Allergen Reaction Noted   Sulfa antibiotics Hives 07/20/2014   Gabapentin  Diarrhea 10/02/2023   Morphine  and codeine Other (See Comments) 02/06/2019    Family History  Problem Relation Age of Onset   Cancer Mother    Hypertension Mother    COPD Mother    Vision loss Mother    COPD Father    Cancer Father        Prostate CA, on Lupron   Hearing loss Father    Stroke Maternal Grandmother    Cancer Paternal Grandmother    Heart attack Paternal Grandfather    Cancer Paternal Grandfather     Social History   Socioeconomic History   Marital status: Married    Spouse name: Alfonse   Number of children: 1   Years of education: Not on file   Highest education level: Some college, no degree  Occupational History   Not on file  Tobacco Use   Smoking status: Former    Types: Cigars   Smokeless tobacco: Current    Types: Chew   Tobacco comments:    No Cigars. Does Dip. Khj 02/16/2023  Vaping Use   Vaping status: Never Used  Substance and Sexual Activity   Alcohol use: Yes    Comment: Occassional beer   Drug use: Never   Sexual activity: Yes    Partners: Female    Birth control/protection: None  Other Topics Concern   Not on file  Social History Narrative   Not on file   Social  Drivers of Corporate Investment Banker Strain: Low Risk  (08/23/2023)   Overall Financial Resource Strain (CARDIA)    Difficulty of Paying Living Expenses: Not hard at all  Food Insecurity: No Food Insecurity (08/23/2023)   Hunger Vital Sign    Worried About Running Out of Food in the Last Year: Never true    Ran Out of Food in the Last Year: Never true  Transportation Needs: No Transportation Needs (08/23/2023)   PRAPARE - Administrator, Civil Service (Medical): No    Lack of Transportation (Non-Medical): No  Physical Activity: Insufficiently Active (08/23/2023)   Exercise Vital Sign    Days  of Exercise per Week: 3 days    Minutes of Exercise per Session: 20 min  Stress: No Stress Concern Present (08/23/2023)   [employer] of Occupational Health - Occupational Stress Questionnaire    Feeling of Stress: Not at all  Social Connections: Moderately Integrated (08/23/2023)   Social Connection and Isolation Panel    Frequency of Communication with Friends and Family: More than three times a week    Frequency of Social Gatherings with Friends and Family: More than three times a week    Attends Religious Services: Patient declined    Database Administrator or Organizations: Yes    Attends Engineer, Structural: More than 4 times per year    Marital Status: Married  Catering Manager Violence: Not At Risk (08/23/2023)   Humiliation, Afraid, Rape, and Kick questionnaire    Fear of Current or Ex-Partner: No    Emotionally Abused: No    Physically Abused: No    Sexually Abused: No    Review of Systems: Pertinent positive and negative review of systems were noted in the above HPI section.  All other review of systems was otherwise negative.   Physical Exam: Vital signs in last 24 hours: Temp:  [97.7 F (36.5 C)-98.2 F (36.8 C)] 97.7 F (36.5 C) (11/19 1055) Pulse Rate:  [78-116] 88 (11/19 1130) Resp:  [14-19] 15 (11/19 1130) BP: (119-138)/(61-80) 125/70 (11/19 1130) SpO2:  [98 %-100 %] 99 % (11/19 1130)   General:   Alert,  Well-developed, well-nourished, older white male pleasant and cooperative in NAD wife at bedside Head:  Normocephalic and atraumatic. Eyes:  Sclera clear, no icterus.   Conjunctiva pink. Ears:  Normal auditory acuity. Nose:  No deformity, discharge,  or lesions. Mouth:  No deformity or lesions.   Neck:  Supple; no masses or thyromegaly. Lungs:  Clear throughout to auscultation.   No wheezes, crackles, or rhonchi.  Heart:  Regular rate and rhythm; no murmurs, clicks, rubs,  or gallops. Abdomen:  Soft,, nondistended, nontender, healed  incisional scar, ostomy in the left lower quadrant with a small amount of fresh and dark red blood Rectal: Not done Msk:  Symmetrical without gross deformities. . Pulses:  Normal pulses noted. Extremities: No clubbing cyanosis or edema, incisional scar left knee Neurologic:  Alert and  oriented x4;  grossly normal neurologically. Skin:  Intact without significant lesions or rashes.. Psych:  Alert and cooperative. Normal mood and affect.  Intake/Output from previous day: No intake/output data recorded. Intake/Output this shift: Total I/O In: -  Out: 150 [Stool:150]  Lab Results: Recent Labs    12/12/23 0601 12/12/23 0609  WBC 6.2  --   HGB 9.1* 9.5*  HCT 29.7* 28.0*  PLT 402*  --    BMET Recent Labs    12/12/23 0601 12/12/23 0609  NA  142 143  K 4.2 4.2  CL 111 112*  CO2 19*  --   GLUCOSE 120* 110*  BUN 33* 33*  CREATININE 0.83 1.00  CALCIUM  9.9  --    LFT Recent Labs    12/12/23 0601  PROT 5.7*  ALBUMIN  3.4*  AST 18  ALT 17  ALKPHOS 52  BILITOT 0.4   PT/INR No results for input(s): LABPROT, INR in the last 72 hours. Hepatitis Panel No results for input(s): HEPBSAG, HCVAB, HEPAIGM, HEPBIGM in the last 72 hours.    IMPRESSION:  #81 68 year old white male who carries diagnosis of stage IV adenocarcinoma of the rectum with initial diagnosis 2015, status post radiation at that time and then low anterior resection with colostomy. Patient developed a left upper lobe lung mass 2020, treated with chemotherapy, and then due to evidence of residual disease underwent left upper lobe lobectomy. He has been followed by oncology/Dr. Jacobo with surveillance CTs and CEA since then all of which have been stable. He just had a CT in October 2025 which shows a stable rectal mass, and no evidence of any metastatic disease.  CEA was ordered but not done.  Patient presents now to the ER after onset of blood per ostomy earlier this morning which she has never had  in the past.  It sounds like he had active bleeding filling his bag a couple of times prior to coming to the ER and at this time bleeding has subsided.  Hemoglobin has drifted over the past month and also was found to be iron  deficient, and has completed outpatient series of IV iron  infusions. Hemoglobin 1 month ago 10.7/down to 9.1 today was 9.2 mid October.  Etiology of the bleeding is not clear, possibly diverticular, rule out subtle colonic lesion not seen on CTA, doubt upper GI source but cannot rule out Rectum appears to be excluded.  #2 iron  deficiency anemia-secondary to above #3 3 of hypertension 4.  Asthmatic bronchitis 5.  History of ureterolithiasis   PLAN: Clear liquid diet today, n.p.o. after midnight Serial hemoglobins and transfuse as indicated CEA Discussed colostomy via his ostomy, and possible EGD in detail with the patient including indications risks and benefits and he is agreeable to proceed.  We will schedule procedures for tomorrow 12/13/2023 with Dr. San, and plan bowel prep this evening . Please hold Lovenox  /subcu heparin  etc. until post procedures GI will follow with you, further recommendations pending results at endoscopic evaluation.    Amy EsterwoodPA-C  12/12/2023, 12:04 PM   Attending physician's note   I have taken a history, reviewed the chart, and examined the patient. I performed a substantive portion of this encounter, including complete performance of at least one of the key components, in conjunction with the APP. I agree with the APP's note, impression, and recommendations with my edits.   68 year old male with medical history as outlined  above, to include history of stage IV rectal adenocarcinoma diagnosed in 2015 s/p chemoradiation followed by surgical resection and diverting end colostomy.  Diagnosed with metastatic lesion of the lung and underwent chemotherapy then left upper lobectomy in 2021.  Since then has been diagnosed with  recurrence of rectal mass.  Most recent imaging in 10/2023 with stable rectal mass and stable 5 mm LLL pulmonary nodule.  Was diagnosed with IDA and received 5 infusions of IV iron .  Presents to the ER today with new onset BRB in his ostomy bag.  No prior similar symptoms.  Was otherwise in his  usual state of health and without other GI symptoms.  CTA negative for active bleeding, 4.5 x 4 cm rectal mass consistent with known rectal cancer.  H/H 9.1/29.7, which is largely stable from 1 month ago (9.2/29.6).  1) Hematochezia 2) Iron deficiency anemia 3) Rectal adenocarcinoma - Plan for colonoscopy via ostomy for diagnostic and potentially therapeutic intent.  If colonoscopy unremarkable, plan for EGD. - Reviewed prior surgical notes from original 2015 resection.  He underwent a difficult abdominal perineal resection with closure and end ostomy. - Clears today, n.p.o. at midnight - Bowel prep this evening for procedure tomorrow - Serial CBC checks with blood products as needed per protocol - Already received IV iron as outpatient  The indications, risks, and benefits of EGD and colonoscopy were explained to the patient in detail. Risks include but are not limited to bleeding, perforation, adverse reaction to medications, and cardiopulmonary compromise. Sequelae include but are not limited to the possibility of surgery, hospitalization, and mortality. The patient verbalized understanding and wished to proceed.   A total of 75 minutes of time was spent on this encounter, including in depth chart review, independent review of results as outlined above, communicating results with the patient directly, face-to-face time with the patient, coordinating care, and ordering studies and medications as appropriate, and documentation.    [address], DO, FACG [phone] office

## 2023-12-12 NOTE — H&P (Signed)
 History and Physical    Patient: [name] [name] FMW:[id number] DOB: 1955/12/19 DOA: 12/12/2023 DOS: the patient was seen and examined on 12/12/2023 PCP: Glenard Mire, MD  Patient coming from: Home  Chief Complaint:  Chief Complaint  Patient presents with   GI Problem   HPI: [name] [name] is a 68 y.o. male with medical history significant of stage IV rectal adenocarcinoma s/p colostomy (2015) and LUL lobectomy for lung metastatic deposit from stage IV rectal adenocarcinoma in 2021 (follows w/ Dr. Jacobo), HTN, HLD, and galucoma p/w BRB in colostomy bag c/w GIB.  The patient reported that around 4 a.m. the dog woke him and his wife up by jumping on the bed (which he never does), and upon getting up, the patient discovered the ostomy bag was full of blood. The patient called their wife for assistance, who removed his ostomy bag and noticed  active bleeding BRB that lasted five to eight minutes. The patient has managed an ostomy for the past ten years and occasionally experiences irritation but never bleeding like this. The patient estimated losing at least one and a half bags of blood in total this morning. Upon standing, the patient experienced dizziness and shortness of breath which prompted his wife to call EMS, and the pt was BIBA to ED. Pt denies feeling well the past few weeks, but does note a h/o IDA that has been recently treated with iron transfusions, with the last one being two weeks ago.   In the ED, pt AFVSS. Labs notable for Hb 9.1-->9.5 (baseline 13-15). CTA abd/pelvis was neg for active GIB, but showed known irregular 4.5 x 4.0 cm rectal mass.  EDP consulted GI, EGS, and requested medicine admission.   Review of Systems: As mentioned in the history of present illness. All other systems reviewed and are negative. Past Medical History:  Diagnosis Date   Allergy    Anemia    Arthritis    Asthmatic bronchitis , chronic (HCC) 2025   Bronchial obstruction 07/2022    Bronchial stenosis, left 06/2022   Bronchial stenosis, left 2025   Cancer (HCC) 2015   rectal, followed by Oncology, permanent colostomy   Cancer of upper lobe of left lung (HCC) 01/2019   Colon cancer (HCC)    Colostomy in place [facility])    Dyslipidemia    Dyspnea    on exertion   Glaucoma    History of kidney stones    Hyperlipidemia    Hypertension    Mass of upper lobe of left lung 01/2019   Neuromuscular disorder (HCC)    Pneumonia    Rectal cancer (HCC) 2015   Seasonal allergies    Past Surgical History:  Procedure Laterality Date   CHOLECYSTECTOMY     COLON SURGERY     has a permanment ostomy   ENDOBRONCHIAL ULTRASOUND N/A 07/01/2018   Procedure: ENDOBRONCHIAL ULTRASOUND;  Surgeon: Tamea Dedra LITTIE, MD;  Location: [facility] ORS;  Service: Cardiopulmonary;  Laterality: N/A;   FLEXIBLE BRONCHOSCOPY Left 11/20/2018   Procedure: FLEXIBLE BRONCHOSCOPY;  Surgeon: Tamea Dedra LITTIE, MD;  Location: [facility];  Service: Cardiopulmonary;  Laterality: Left;   FLEXIBLE BRONCHOSCOPY Left 12/23/2021   Procedure: FLEXIBLE BRONCHOSCOPY;  Surgeon: Tamea Dedra LITTIE, MD;  Location: [facility];  Service: Pulmonary;  Laterality: Left;   FLEXIBLE BRONCHOSCOPY Left 08/09/2022   Procedure: FLEXIBLE BRONCHOSCOPY;  Surgeon: Tamea Dedra LITTIE, MD;  Location: [facility];  Service: Pulmonary;  Laterality: Left;   FLEXIBLE BRONCHOSCOPY Left 04/30/2023   Procedure: BRONCHOSCOPY,  FLEXIBLE;  Surgeon: Tamea Dedra CROME, MD;  Location: [facility];  Service: Pulmonary;  Laterality: Left;   INTERCOSTAL NERVE BLOCK Left 02/03/2019   Procedure: INTERCOSTAL NERVE BLOCK WITH EXPAREL ;  Surgeon: Army Dallas NOVAK, MD;  Location: [facility] OR;  Service: Thoracic;  Laterality: Left;   KNEE ARTHROSCOPY Right    KNEE SURGERY Left    torn ligaments (staple in place)   laceration of wrist Right    LOBECTOMY Left 02/03/2019   Procedure: LEFT UPPER LOBECTOMY WITH LYMPH NODE DISSECTION;  Surgeon: Army Dallas NOVAK, MD;  [facility];  Service: Thoracic;  Laterality: Left;   LUMBAR DISC SURGERY     ruptured disc   PORT A CATH REVISION Left 08/05/2018   Procedure: PORT A CATH REVISION, REPOSITION LEFT;  Surgeon: Dessa Reyes ORN, MD;  Location: [facility];  Service: General;  Laterality: Left;   PORT-A-CATH REMOVAL Right 12/02/2014   Procedure: REMOVAL PORT-A-CATH;  Surgeon: Lonni Brands, MD;  Location: [facility];  Service: General;  Laterality: Right;   PORTACATH PLACEMENT     PORTACATH PLACEMENT Left 07/29/2018   Procedure: INSERTION PORT-A-CATH LEFT;  Surgeon: Dessa Reyes ORN, MD;  Location: [facility];  Service: General;  Laterality: Left;   RECTAL BIOPSY  04/30/2013   RIGID BRONCHOSCOPY Left 08/09/2022   Procedure: RIGID BRONCHOSCOPY;  Surgeon: Tamea Dedra CROME, MD;  Location: [facility];  Service: Pulmonary;  Laterality: Left;   SPINE SURGERY  1989   THORACOTOMY/LOBECTOMY Left 02/03/2019   Procedure: MINITHORACOTOMY;  Surgeon: Army Dallas NOVAK, MD;  Location: [facility] OR;  Service: Thoracic;  Laterality: Left;   VIDEO ASSISTED THORACOSCOPY (VATS)/WEDGE RESECTION Left 02/03/2019   Procedure: VIDEO ASSISTED THORACOSCOPY (VATS)/LUNG RESECTION;  Surgeon: Army Dallas NOVAK, MD;  Location: [facility] OR;  Service: Thoracic;  Laterality: Left;   VIDEO BRONCHOSCOPY N/A 02/03/2019   Procedure: VIDEO BRONCHOSCOPY;  Surgeon: Army Dallas NOVAK, MD;  Location: [facility] OR;  Service: Thoracic;  Laterality: N/A;   VIDEO BRONCHOSCOPY N/A 10/28/2021   Procedure: VIDEO BRONCHOSCOPY WITH CRYO and DEBULKING;  Surgeon: Tamea Dedra CROME, MD;  Location: [facility];  Service: Cardiopulmonary;  Laterality: N/A;   VIDEO BRONCHOSCOPY WITH ENDOBRONCHIAL ULTRASOUND N/A 12/30/2018   Procedure: VIDEO BRONCHOSCOPY WITH ENDOBRONCHIAL ULTRASOUND;  Surgeon: Army Dallas NOVAK, MD;  Location[facility] OR;  Service: Thoracic;  Laterality: N/A;   XI ROBOT ABDOMINAL PERINEAL RESECTION  10/09/2013   Social History:  reports that he has quit smoking. His smoking  use included cigars. His smokeless tobacco use includes chew. He reports current alcohol use. He reports that he does not use drugs.  Allergies  Allergen Reactions   Sulfa Antibiotics Hives   Gabapentin  Diarrhea   Morphine  And Codeine Other (See Comments)    Altered mental status    Family History  Problem Relation Age of Onset   Cancer Mother    Hypertension Mother    COPD Mother    Vision loss Mother    COPD Father    Cancer Father        Prostate CA, on Lupron   Hearing loss Father    Stroke Maternal Grandmother    Cancer Paternal Grandmother    Heart attack Paternal Grandfather    Cancer Paternal Grandfather     Prior to Admission medications   Medication Sig Start Date End Date Taking? Authorizing Provider  acetaminophen  (TYLENOL ) 500 MG tablet Take 1,000 mg by mouth every 6 (six) hours as needed for mild pain.    [provider]  Albuterol -Budesonide  (  AIRSUPRA ) 90-80 MCG/ACT AERO Inhale 2 puffs into the lungs every 6 (six) hours as needed. 11/01/23   Tamea Dedra CROME, MD  amLODipine  (NORVASC ) 10 MG tablet Take 1 tablet (10 mg total) by mouth daily. 10/02/23   Sowles, Krichna, MD  azithromycin  (ZITHROMAX ) 250 MG tablet Take 1 tablet (250 mg total) by mouth daily. 11/27/23   Assaker, Darrin, MD  calcium  carbonate (TUMS - DOSED IN MG ELEMENTAL CALCIUM ) 500 MG chewable tablet Chew 2 tablets by mouth as needed for indigestion or heartburn.    [provider]  cetirizine (ZYRTEC) 10 MG tablet Take 10 mg by mouth at bedtime.     [provider]  cyclobenzaprine  (FLEXERIL ) 10 MG tablet TAKE 1 TABLET BY MOUTH THREE TIMES A DAY AS NEEDED FOR MUSCLE SPASM 10/02/23   Sowles, Krichna, MD  loperamide  (IMODIUM ) 2 MG capsule Take 4 mg by mouth as needed for diarrhea or loose stools.     [provider]  Multiple Vitamin (MULTIVITAMIN WITH MINERALS) TABS tablet Take 1 tablet by mouth every evening.  Patient not taking: Reported on 11/07/2023    [provider]  tadalafil  (CIALIS ) 20 MG tablet TAKE 1 TAB BY MOUTH 1 HOUR PRIOR TO INTERCOURSE AS NEEDED 10/15/21 11/07/23  Stoioff, Glendia BROCKS, MD  WIXELA INHUB 250-50 MCG/ACT AEPB INHALE 1 PUFF INTO THE LUNGS IN THE MORNING AND AT BEDTIME. 09/07/23   Tamea Dedra CROME, MD    Physical Exam: Vitals:   12/12/23 0815 12/12/23 0830 12/12/23 0900 12/12/23 0915  BP: 120/68 136/80 127/74 129/74  Pulse: 78 85 82 80  Resp: 15 15 16 14   Temp:      TempSrc:      SpO2: 99% 100% 99% 99%   General: Alert, oriented x3, resting comfortably in no acute distress Respiratory: Lungs clear to auscultation bilaterally with normal respiratory effort; no w/r/r Cardiovascular: Regular rate and rhythm w/o m/r/g Abdomen: Soft, nontender, nondistended. Positive bowel sounds. LLQ ostomy bag with approx 10cc frank blood   Data Reviewed:  Lab Results  Component Value Date   WBC 6.2 12/12/2023   HGB 9.5 (L) 12/12/2023   HCT 28.0 (L) 12/12/2023   MCV 85.3 12/12/2023   PLT 402 (H) 12/12/2023   Lab Results  Component Value Date   GLUCOSE 110 (H) 12/12/2023   CALCIUM  9.9 12/12/2023   NA 143 12/12/2023   K 4.2 12/12/2023   CO2 19 (L) 12/12/2023   CL 112 (H) 12/12/2023   BUN 33 (H) 12/12/2023   CREATININE 1.00 12/12/2023   Lab Results  Component Value Date   ALT 17 12/12/2023   AST 18 12/12/2023   ALKPHOS 52 12/12/2023   BILITOT 0.4 12/12/2023   Lab Results  Component Value Date   INR 0.9 01/30/2019   INR 1.0 12/30/2018   INR 1.0 05/08/2013   Radiology: CT Angio Abd/Pel W and/or Wo Contrast Result Date: 12/12/2023 EXAM: CTA ABDOMEN AND PELVIS WITHOUT AND WITH CONTRAST 12/12/2023 07:19:49 AM TECHNIQUE: CTA images of the abdomen and pelvis without and with intravenous contrast (iohexol  (OMNIPAQUE ) 350 MG/ML injection 75 mL IOHEXOL  350 MG/ML SOLN). Three-dimensional MIP/volume rendered formations were performed. Automated exposure control, iterative reconstruction, and/or weight based adjustment of  the mA/kV was utilized to reduce the radiation dose to as low as reasonably achievable. COMPARISON: 10/25/2023 CLINICAL HISTORY: Lower GI bleed; red blood in colostomy. FINDINGS: VASCULATURE: No definite evidence of contrast extravasation is noted to suggest gastrointestinal bleeding. AORTA: No acute finding. No abdominal aortic aneurysm.  No dissection. CELIAC TRUNK: No acute finding. No occlusion or significant stenosis. SUPERIOR MESENTERIC ARTERY: No acute finding. No occlusion or significant stenosis. RENAL ARTERIES: No acute finding. No occlusion or significant stenosis. ILIAC ARTERIES: No acute finding. No occlusion or significant stenosis. LIVER: The liver is unremarkable. GALLBLADDER AND BILE DUCTS: Status post cholecystectomy. No biliary ductal dilatation. SPLEEN: The spleen is unremarkable. PANCREAS: The pancreas is unremarkable. ADRENAL GLANDS: Bilateral adrenal glands demonstrate no acute abnormality. KIDNEYS, URETERS AND BLADDER: Bilateral nonobstructive nephrolithiasis. No stones in the ureters. No hydronephrosis. No perinephric or periureteral stranding. Urinary bladder is unremarkable. GI AND BOWEL: Colostomy noted in left lower quadrant. Diverticulosis of the descending colon without inflammation. 4.5 x 4.0 cm irregular mass seen in rectal region consistent with history of rectal malignancy. Stomach and duodenal sweep demonstrate no acute abnormality. There is no bowel obstruction. No abnormal bowel wall thickening or distension. REPRODUCTIVE: Reproductive organs are unremarkable. PERITONEUM AND RETRPERITONEUM: No ascites or free air. LUNG BASE: No acute abnormality. LYMPH NODES: No lymphadenopathy. BONES AND SOFT TISSUES: No acute abnormality of the bones. No acute soft tissue abnormality. IMPRESSION: 1. No definite evidence of active gastrointestinal bleeding on this CTA. 2. Irregular 4.5 x 4.0 cm rectal mass, compatible with known rectal malignancy. Electronically signed by: Lynwood Seip MD  12/12/2023 07:44 AM EST RP Workstation: [id number]    Assessment and Plan: 38M h/o stage IV rectal adenocarcinoma s/p colostomy (2015) and LUL lobectomy for lung metastatic deposit from stage IV rectal adenocarcinoma in 2021 (follows w/ Dr. Jacobo), HTN, HLD, and galucoma p/w BRB in colostomy bag c/w GIB.  GIB H/o colostomy H/o stage IV rectal adenocarcinoma -GI consulted; apprec eval/rec -EGS consulted; apprec eval/recs -2 large PIVs; f/u CBC q8h, goal Hb >7, transfuse prn; IV PPI BID; MIVF w/ NS 100cc/h x 24h; HOLD OAC and antiplatelet agents  -PTA Wixhela not on formulary; trial Breo Ellipta daily for now; Duonebs prn  HTN -HOLD pta amlodipine  10mg  daily for now given BRB above   Advance Care Planning:   Code Status: Full Code   Consults: EGS and GI  Family Communication: Wife  Severity of Illness: The appropriate patient status for this patient is INPATIENT. Inpatient status is judged to be reasonable and necessary in order to provide the required intensity of service to ensure the patient's safety. The patient's presenting symptoms, physical exam findings, and initial radiographic and laboratory data in the context of their chronic comorbidities is felt to place them at high risk for further clinical deterioration. Furthermore, it is not anticipated that the patient will be medically stable for discharge from the hospital within 2 midnights of admission.   * I certify that at the point of admission it is my clinical judgment that the patient will require inpatient hospital care spanning beyond 2 midnights from the point of admission due to high intensity of service, high risk for further deterioration and high frequency of surveillance required.*   ------- I spent 60 minutes reviewing previous notes, at the bedside counseling/discussing the treatment plan, and performing clinical documentation.  Author: Marsha Ada, MD 12/12/2023 10:19 AM  For on call review  [email or web address].

## 2023-12-12 NOTE — ED Notes (Signed)
 Since arrival pt has had 200 mL bloody output from colostomy bag

## 2023-12-12 NOTE — ED Notes (Signed)
 Phlebotomy requested for lab draw. Pt sts he's is a difficult stick with port.

## 2023-12-12 NOTE — Consult Note (Addendum)
 WOC Nurse ostomy consult note Consult requested for bleeding ostomy.  Pt had colostomy surgery at [facility] earlier in 2025 and has been independent with pouch application and emptying.  He states the pouch began having a significant amount of bleeding early this morning. Wife at the bedside is familiar with pouching activities.  CT scan has been performed and results are pending.  Stoma is red and viable, 2 inches and oval, slightly above skin level.  Removed pouch and cleansed skin; currently no active bleeding.  Approx 150cc dark brown-red old blood emptied from the pouch.    There is a location of stomal irritation to a fold in the stoma at 7:00-9:00 o'clock with small amt bloody drainage remaining, this might have been the source of the previous bleeding.  Applied barrier ring and one piece flat pouch, which is the current pouching system the patient used prior to admission. Use supplies: barrier ring, Lawson # [phone] and flat one piece pouch Lawson # [phone]. He states he will have his wife bring in more supplies from home for his use while in the hospital.  Use supplies:   Please re-consult if further assistance is needed.  Thank-you,  Stephane Fought MSN, RN, CWOCN, CWCN-AP, CNS Contact Mon-Fri 0700-1500: [phone]

## 2023-12-12 NOTE — ED Notes (Signed)
 Charge nurse informed pt needs room

## 2023-12-12 NOTE — ED Triage Notes (Signed)
 Pt BIB [facility] from home. Pt reports concern for bloody output from colostomy bag this morning. Denies pain.   On assessment pt colostomy bag is full of dark red output. Pt sts having emptied his bag once.

## 2023-12-12 NOTE — Consult Note (Addendum)
 WOC Nurse ostomy follow up Requested to provide patient with a high output pouch to decrease the chance of over-filling when he  has to prep for a procedure.  Left a large, flat one piece pouch (free sample from [employer], not in stock at [facility] formulary) and a barrier ring at the bedside which the patient can apply prior to drinking the GI prep later today. This will still need to be emptied approx every half hour when the prep causes a large amt of liquid stool, to avoid overfilling and leakage.  Also provided patient with another flat one piece pouch and barrier ring for use after the larger pouch is removed.  He verbalized understanding.   Please re-consult if further assistance is needed.  Thank-you,  Stephane Fought MSN, RN, CWOCN, CWCN-AP, CNS Contact Mon-Fri 0700-1500: [phone]

## 2023-12-12 NOTE — ED Notes (Signed)
 IV team at bedside

## 2023-12-13 ENCOUNTER — Encounter (HOSPITAL_COMMUNITY): Payer: Self-pay | Admitting: Anesthesiology

## 2023-12-13 ENCOUNTER — Encounter (HOSPITAL_COMMUNITY)
Admission: EM | Disposition: A | Discharge status: Home / Self Care | Payer: Self-pay | Source: Home / Self Care | Attending: Emergency Medicine

## 2023-12-13 DIAGNOSIS — D62 Acute posthemorrhagic anemia: Secondary | ICD-10-CM | POA: Diagnosis not present

## 2023-12-13 DIAGNOSIS — K922 Gastrointestinal hemorrhage, unspecified: Secondary | ICD-10-CM | POA: Diagnosis not present

## 2023-12-13 DIAGNOSIS — Z85038 Personal history of other malignant neoplasm of large intestine: Secondary | ICD-10-CM | POA: Diagnosis not present

## 2023-12-13 DIAGNOSIS — K921 Melena: Secondary | ICD-10-CM | POA: Diagnosis not present

## 2023-12-13 LAB — HEMOGLOBIN AND HEMATOCRIT, BLOOD
HCT: 24.9 % — ABNORMAL LOW (ref 39.0–52.0)
HCT: 23 % — ABNORMAL LOW (ref 39.0–52.0)
Hemoglobin: 7.7 g/dL — ABNORMAL LOW (ref 13.0–17.0)
Hemoglobin: 7.2 g/dL — ABNORMAL LOW (ref 13.0–17.0)

## 2023-12-13 LAB — CEA: CEA: 2.5 ng/mL (ref 0.0–4.7)

## 2023-12-13 LAB — SURGICAL PCR SCREEN
MRSA, PCR: NEGATIVE
Staphylococcus aureus: NEGATIVE

## 2023-12-13 SURGERY — COLONOSCOPY
Anesthesia: Monitor Anesthesia Care

## 2023-12-13 MED ORDER — SODIUM CHLORIDE 0.9 % IV SOLN: INTRAVENOUS | Status: DC

## 2023-12-13 MED ORDER — ACETAMINOPHEN 325 MG PO TABS
650.0000 mg | ORAL_TABLET | Freq: Four times a day (QID) | ORAL | Status: DC | PRN
  Administered 2023-12-13 – 2023-12-14 (×3): 650 mg via ORAL
  Filled 2023-12-13 (×3): qty 2

## 2023-12-13 MED ORDER — NA SULFATE-K SULFATE-MG SULF 17.5-3.13-1.6 GM/177ML PO SOLN
0.5000 | Freq: Once | ORAL | Status: AC
  Administered 2023-12-13: 177 mL via ORAL
  Filled 2023-12-13: qty 1

## 2023-12-13 MED ORDER — MUPIROCIN 2 % EX OINT
1.0000 | TOPICAL_OINTMENT | Freq: Two times a day (BID) | CUTANEOUS | Status: DC
  Administered 2023-12-13 – 2023-12-14 (×3): 1 via NASAL
  Filled 2023-12-13: qty 22

## 2023-12-13 MED ORDER — NA SULFATE-K SULFATE-MG SULF 17.5-3.13-1.6 GM/177ML PO SOLN
0.5000 | Freq: Once | ORAL | Status: AC
  Administered 2023-12-14: 177 mL via ORAL

## 2023-12-13 MED ORDER — OXYCODONE HCL 5 MG PO TABS: 5.0000 mg | ORAL_TABLET | Freq: Four times a day (QID) | ORAL | Status: DC | PRN

## 2023-12-13 NOTE — Anesthesia Preprocedure Evaluation (Addendum)
 Anesthesia Evaluation  Patient identified by MRN, date of birth, ID band Patient awake    Reviewed: Allergy & Precautions, NPO status , Patient's Chart, lab work & pertinent test results  History of Anesthesia Complications Negative for: history of anesthetic complications  Airway Mallampati: II  TM Distance: >3 FB Neck ROM: Full    Dental  (+) Dental Advisory Given   Pulmonary asthma , former smoker  Lung cancer    Pulmonary exam normal        Cardiovascular hypertension, Pt. on medications Normal cardiovascular exam     Neuro/Psych  Neuromuscular disease  negative psych ROS   GI/Hepatic Neg liver ROS,,, Colon cancer Colostomy    Endo/Other  negative endocrine ROS    Renal/GU negative Renal ROS     Musculoskeletal  (+) Arthritis ,    Abdominal   Peds  Hematology  (+) Blood dyscrasia, anemia   Anesthesia Other Findings   Reproductive/Obstetrics                              Anesthesia Physical Anesthesia Plan  ASA: 3  Anesthesia Plan: MAC   Post-op Pain Management: Minimal or no pain anticipated   Induction:   PONV Risk Score and Plan: 1 and Propofol  infusion and Treatment may vary due to age or medical condition  Airway Management Planned: Nasal Cannula and Natural Airway  Additional Equipment: None  Intra-op Plan:   Post-operative Plan:   Informed Consent: I have reviewed the patients History and Physical, chart, labs and discussed the procedure including the risks, benefits and alternatives for the proposed anesthesia with the patient or authorized representative who has indicated his/her understanding and acceptance.       Plan Discussed with: CRNA and Anesthesiologist  Anesthesia Plan Comments:          Anesthesia Quick Evaluation

## 2023-12-13 NOTE — Progress Notes (Signed)
 PROGRESS NOTE  [name] [name]  DOB: 1955-03-02  PCP: Sowles, Krichna, MD FMW:[id number]  DOA: 12/12/2023  LOS: 1 day  Hospital Day: 2  Subjective: Patient was seen and examined this morning. Pleasant elderly Caucasian male.  Propped up in bed.  Not in distress. No family at bedside Alert, awake, oriented x 3 Colostomy with brownish looking output at the time of my evaluation  Brief narrative: [name] [name] is a 68 y.o. male with PMH significant for stage IV rectal adenocarcinoma s/p APR colostomy (2015) and LUL lobectomy for lung mets from rectal cancer 2021 (follows w/ Dr. Jacobo), HTN, HLD, chronic iron deficiency anemia, and galucoma. 11/19, patient was brought to the ED by EMS with concern of bloody output from colostomy bag.  When he woke up in the morning, he noticed that his ostomy bag was black.  His wife helped him remove the bag.  He had active bleeding for 5 to 8 minutes.  Estimated blood loss about one and half bag. Upon standing, the patient experienced dizziness and shortness of breath.  EMS was called and brought to ED. Patient has had colostomy bag for last 10 years and never had similar bleeding in the past. For iron deficiency anemia, patient has been treated with iron transfusions, with the last one being two weeks ago.   In the ED, afebrile, hemodynamically stable. Labs notable for Hb 9.1-->9.5 (baseline 13-15).  CTA abd/pelvis was neg for active GIB, but showed known irregular 4.5 x 4.0 cm rectal mass.   EDP consulted [facility]  Admitted to [facility]  Assessment and plan: Lower GI bleeding Presenting with acute bright red blood from colostomy Unclear etiology GI following Potential needs endoscopic evaluations.  Noted tentative plan for tomorrow after colon is clean Clear liquid diet for now.  N.p.o. after midnight  Acute blood loss anemia  Chronic iron deficiency anemia Baseline hemoglobin above 11.  Presented with hemoglobin of 9.5 which further down  trended to 7.7 due to acute GI bleeding Continue to monitor Receives iron infusion as an outpatient Recent Labs    04/09/23 0816 10/25/23 0852 11/07/23 1020 12/12/23 0601 12/12/23 0609 12/12/23 1553 12/12/23 2043 12/13/23 0345  HGB 11.2*   < > 9.2* 9.1* 9.5* 7.8* 7.7* 7.7*  MCV 84.5   < > 76.7* 85.3  --   --   --   --   FERRITIN 11*  --  8*  --   --   --   --   --   TIBC 403  --  391  --   --   --   --   --   IRON 25*  --  21*  --   --   --   --   --    < > = values in this interval not displayed.   Stage IV rectal adenocarcinoma  s/p APR colostomy (2015) and LUL lobectomy for lung mets from rectal cancer 2021  CT abdomen with known stable rectal mass Follows w/ oncologist Dr. Jacobo at [facility].  HTN Hemodynamically stable currently  PTA meds- amlodipine  10 mg daily Currently on hold  Mobility: Independently able to ambulate  PT Orders:   PT Follow up Rec:     Goals of care   Code Status: Full Code     DVT prophylaxis:  SCDs Start: 12/12/23 0942   Antimicrobials: None Fluid: none Consultants: GI Family Communication: None at bedside  Status: Inpatient Level of care:  Telemetry   Patient is  from: Home Needs to continue in-hospital care: Pending endoscopic evaluation tomorrow Anticipated d/c to: Hopefully home in 1 to 2 days   Diet:  Diet Order             Diet NPO time specified Except for: Sips with Meds  Diet effective midnight           Diet clear liquid Fluid consistency: Thin  Diet effective now                   Scheduled Meds:  Chlorhexidine  Gluconate Cloth  6 each Topical Daily   fluticasone  furoate-vilanterol  1 puff Inhalation Daily   mupirocin  ointment  1 Application Nasal BID   Na Sulfate-K Sulfate-Mg Sulfate concentrate  0.5 kit Oral Once   Na Sulfate-K Sulfate-Mg Sulfate concentrate  0.5 kit Oral Once   Followed by   [START ON 12/14/2023] Na Sulfate-K Sulfate-Mg Sulfate concentrate  0.5 kit Oral Once   pantoprazole  (PROTONIX) IV  40 mg Intravenous Q12H   simethicone   240 mg Oral Once   sodium chloride  flush  10-40 mL Intracatheter Q12H    PRN meds: ipratropium-albuterol    Infusions:   sodium chloride       Antimicrobials: Anti-infectives (From admission, onward)    None       Objective: Vitals:   12/13/23 0826 12/13/23 1133  BP:  126/74  Pulse: 78 77  Resp: 16 16  Temp:  98.4 F (36.9 C)  SpO2:  98%    Intake/Output Summary (Last 24 hours) at 12/13/2023 1324 Last data filed at 12/12/2023 1930 Gross per 24 hour  Intake --  Output 275 ml  Net -275 ml   There were no vitals filed for this visit. Weight change:  There is no height or weight on file to calculate BMI.   Physical Exam: General exam: Pleasant, elderly Caucasian male Skin: No rashes, lesions or ulcers. HEENT: Atraumatic, normocephalic, no obvious bleeding Lungs: Clear to auscultation bilaterally,  CVS: S1, S2, no murmur,   GI/Abd: Soft, nontender, nondistended, bowel sound present, colostomy with brownish output CNS: Alert, awake, oriented x 3 Psychiatry: Mood appropriate Extremities: No pedal edema, no calf tenderness,   Data Review: I have personally reviewed the laboratory data and studies available.  F/u labs ordered Unresulted Labs (From admission, onward)     Start     Ordered   12/12/23 1145  Hemoglobin and hematocrit, blood  Now then every 8 hours,   R     Comments: Does not need the now order just start 8 hours after last hemoglobin and call MD for hemoglobin 7.5 or less for transfusion orders   Question:  Release to patient  Answer:  Immediate   12/12/23 1144   Unscheduled  CBC with Differential/Platelet  Tomorrow morning,   R        12/13/23 1324   Unscheduled  Basic metabolic panel with GFR  Tomorrow morning,   R        12/13/23 1324            Signed, Chapman Rota, MD [facility] 12/13/2023

## 2023-12-13 NOTE — Progress Notes (Addendum)
 Patient ID: [name] [name], male   DOB: [birth year], 68 y.o.   MRN: [id number]    Progress Note   Subjective   Day # 2 CC; acute GI bleed with blood per ostomy  Unfortunately patient did not receive his bowel prep at the times that it was ordered last evening and was not adequately prepped for colonoscopy this morning so colonoscopy is being rescheduled for tomorrow a.m. and he will see if additional prep this evening  He says his stool is clearing at this point No complaints of abdominal pain Also little lightheaded this morning with getting out of bed but has not had any recurrence of that since  Lab today-hemoglobin 7.7/hematocrit 24.9 stable since last evening-was 9.1 on arrival     Objective   Vital signs in last 24 hours: Temp:  [98.2 F (36.8 C)-98.8 F (37.1 C)] 98.4 F (36.9 C) (11/20 1133) Pulse Rate:  [74-86] 77 (11/20 1133) Resp:  [14-16] 16 (11/20 1133) BP: (117-139)/(66-82) 126/74 (11/20 1133) SpO2:  [98 %-100 %] 98 % (11/20 1133)   General:    Older white male in NAD Heart:  Regular rate and rhythm; no murmurs Lungs: Respirations even and unlabored, lungs CTA bilaterally Abdomen:  Soft, nontender and nondistended. Normal bowel sounds.  Ostomy with very dark brownish-black effluent Extremities:  Without edema. Neurologic:  Alert and oriented,  grossly normal neurologically. Psych:  Cooperative. Normal mood and affect.  Intake/Output from previous day: 11/19 0701 - 11/20 0700 In: -  Out: 425 [Urine:275; Stool:150] Intake/Output this shift: Total I/O In: 0  Out: 500 [Urine:500]  Lab Results: Recent Labs    12/12/23 0601 12/12/23 0609 12/12/23 1553 12/12/23 2043 12/13/23 0345  WBC 6.2  --   --   --   --   HGB 9.1*   < > 7.8* 7.7* 7.7*  HCT 29.7*   < > 26.1* 24.9* 24.9*  PLT 402*  --   --   --   --    < > = values in this interval not displayed.   BMET Recent Labs    12/12/23 0601 12/12/23 0609  NA 142 143  K 4.2 4.2  CL 111 112*  CO2 19*   --   GLUCOSE 120* 110*  BUN 33* 33*  CREATININE 0.83 1.00  CALCIUM  9.9  --    LFT Recent Labs    12/12/23 0601  PROT 5.7*  ALBUMIN  3.4*  AST 18  ALT 17  ALKPHOS 52  BILITOT 0.4   PT/INR No results for input(s): LABPROT, INR in the last 72 hours.  Studies/Results: CT Angio Abd/Pel W and/or Wo Contrast Result Date: 12/12/2023 EXAM: CTA ABDOMEN AND PELVIS WITHOUT AND WITH CONTRAST 12/12/2023 07:19:49 AM TECHNIQUE: CTA images of the abdomen and pelvis without and with intravenous contrast (iohexol  (OMNIPAQUE ) 350 MG/ML injection 75 mL IOHEXOL  350 MG/ML SOLN). Three-dimensional MIP/volume rendered formations were performed. Automated exposure control, iterative reconstruction, and/or weight based adjustment of the mA/kV was utilized to reduce the radiation dose to as low as reasonably achievable. COMPARISON: 10/25/2023 CLINICAL HISTORY: Lower GI bleed; red blood in colostomy. FINDINGS: VASCULATURE: No definite evidence of contrast extravasation is noted to suggest gastrointestinal bleeding. AORTA: No acute finding. No abdominal aortic aneurysm. No dissection. CELIAC TRUNK: No acute finding. No occlusion or significant stenosis. SUPERIOR MESENTERIC ARTERY: No acute finding. No occlusion or significant stenosis. RENAL ARTERIES: No acute finding. No occlusion or significant stenosis. ILIAC ARTERIES: No acute finding. No occlusion or significant stenosis. LIVER: The  liver is unremarkable. GALLBLADDER AND BILE DUCTS: Status post cholecystectomy. No biliary ductal dilatation. SPLEEN: The spleen is unremarkable. PANCREAS: The pancreas is unremarkable. ADRENAL GLANDS: Bilateral adrenal glands demonstrate no acute abnormality. KIDNEYS, URETERS AND BLADDER: Bilateral nonobstructive nephrolithiasis. No stones in the ureters. No hydronephrosis. No perinephric or periureteral stranding. Urinary bladder is unremarkable. GI AND BOWEL: Colostomy noted in left lower quadrant. Diverticulosis of the descending  colon without inflammation. 4.5 x 4.0 cm irregular mass seen in rectal region consistent with history of rectal malignancy. Stomach and duodenal sweep demonstrate no acute abnormality. There is no bowel obstruction. No abnormal bowel wall thickening or distension. REPRODUCTIVE: Reproductive organs are unremarkable. PERITONEUM AND RETRPERITONEUM: No ascites or free air. LUNG BASE: No acute abnormality. LYMPH NODES: No lymphadenopathy. BONES AND SOFT TISSUES: No acute abnormality of the bones. No acute soft tissue abnormality. IMPRESSION: 1. No definite evidence of active gastrointestinal bleeding on this CTA. 2. Irregular 4.5 x 4.0 cm rectal mass, compatible with known rectal malignancy. Electronically signed by: Lynwood Seip MD 12/12/2023 07:44 AM EST RP Workstation: [id number]       Assessment / Plan:    #10 68 year old white male with diagnosis of stage IV adenocarcinoma of the rectum/initial diagnosis 2015 status post radiation then low anterior resection with colostomy. He had recurrence with a left upper lobe lung mass 2020 treated with chemotherapy, then underwent left upper lobe lobectomy.  He has been on observation/surveillance per oncology since then with serial CTs and CEA's all of which have been stable. He had just had a CT October 2025 with stable rectal soft tissue/masslike component, no evidence of any metastatic disease CEA normal  Presented to the ER yesterday after onset of blood per ostomy earlier in the morning hours.  Had active dark red blood filling his ostomy bag a couple of times prior to coming to the emergency room and bleeding had subsided at the time of admission. Is also had a drift in his hemoglobin over the past month, found to be iron  deficient per oncology and just completed several IV iron  infusions.  Globin 10.78-month ago> 9.1 yesterday> 7.7 last p.m. and 7.7 earlier this a.m.  CTA was negative for any active bleeding, diverticulosis noted This may have been a  diverticular hemorrhage, cannot rule out subtle colonic lesion not seen on CTA  #2 iron  deficiency anemia/as above 3.  Hypertension 4.  Asthmatic bronchitis 5.  History of ureterolithiasis  Plan; continue clear liquids today, n.p.o. after midnight except prep Additional prep ordered for this afternoon and then late this evening if stool not clear Continue to trend hemoglobin and would transfuse if hemoglobin less than 7.5 Patient rescheduled for colonoscopy with Dr. San in a.m. tomorrow, plans discussed with the patient fully at bedside today    Principal Problem:   GIB (gastrointestinal bleeding) Active Problems:   Acute blood loss anemia   History of colon cancer   Hematochezia     LOS: 1 day   Amy EsterwoodPA-C  12/13/2023, 2:15 PM    Attending physician's note   I have taken a history, reviewed the chart, and examined the patient. I performed a substantive portion of this encounter, including complete performance of at least one of the key components, in conjunction with the APP. I agree with the APP's note, impression, and recommendations with my edits.   Was ordered for bowel prep which should have been started early last evening, but unfortunately he never received any prep until after midnight.  He drank the  entire prep as directed, but still with dark stools.  Colonoscopy was therefore postponed with plan to have this done tomorrow.  Understandably upset.  H/H otherwise largely stable at 7.7/25, but overall downtrend from 1 month ago and from admission.  - CLD today with n.p.o. at midnight except for prep - Ordered for more prep early this afternoon and if stools not clear, already has prep in place for midnight tonight.  To drink each over 1 hour - Serial CBC checks with blood products if Hgb <7.5 given plan for sedation - Plan for colonoscopy +/- EGD tomorrow morning for diagnostic and potentially therapeutic intent  Sandor Flatter, DO, FACG [phone]  office

## 2023-12-13 NOTE — Plan of Care (Signed)
  Problem: Activity: Goal: Risk for activity intolerance will decrease Outcome: Progressing   Problem: Coping: Goal: Level of anxiety will decrease Outcome: Progressing   Problem: Elimination: Goal: Will not experience complications related to urinary retention Outcome: Progressing   Problem: Pain Managment: Goal: General experience of comfort will improve and/or be controlled Outcome: Progressing   Problem: Safety: Goal: Ability to remain free from injury will improve Outcome: Progressing   Problem: Skin Integrity: Goal: Risk for impaired skin integrity will decrease Outcome: Progressing

## 2023-12-13 NOTE — Plan of Care (Signed)
  Problem: Education: Goal: Knowledge of General Education information will improve Description: Including pain rating scale, medication(s)/side effects and non-pharmacologic comfort measures Outcome: Progressing   Problem: Clinical Measurements: Goal: Will remain free from infection Outcome: Progressing Goal: Respiratory complications will improve Outcome: Progressing   Problem: Activity: Goal: Risk for activity intolerance will decrease Outcome: Progressing   Problem: Safety: Goal: Ability to remain free from injury will improve Outcome: Progressing   Problem: Skin Integrity: Goal: Risk for impaired skin integrity will decrease Outcome: Progressing   

## 2023-12-13 NOTE — H&P (View-Only) (Signed)
 Patient ID: [name] [name], male   DOB: [birth year], 68 y.o.   MRN: [id number]    Progress Note   Subjective   Day # 2 CC; acute GI bleed with blood per ostomy  Unfortunately patient did not receive his bowel prep at the times that it was ordered last evening and was not adequately prepped for colonoscopy this morning so colonoscopy is being rescheduled for tomorrow a.m. and he will see if additional prep this evening  He says his stool is clearing at this point No complaints of abdominal pain Also little lightheaded this morning with getting out of bed but has not had any recurrence of that since  Lab today-hemoglobin 7.7/hematocrit 24.9 stable since last evening-was 9.1 on arrival     Objective   Vital signs in last 24 hours: Temp:  [98.2 F (36.8 C)-98.8 F (37.1 C)] 98.4 F (36.9 C) (11/20 1133) Pulse Rate:  [74-86] 77 (11/20 1133) Resp:  [14-16] 16 (11/20 1133) BP: (117-139)/(66-82) 126/74 (11/20 1133) SpO2:  [98 %-100 %] 98 % (11/20 1133)   General:    Older white male in NAD Heart:  Regular rate and rhythm; no murmurs Lungs: Respirations even and unlabored, lungs CTA bilaterally Abdomen:  Soft, nontender and nondistended. Normal bowel sounds.  Ostomy with very dark brownish-black effluent Extremities:  Without edema. Neurologic:  Alert and oriented,  grossly normal neurologically. Psych:  Cooperative. Normal mood and affect.  Intake/Output from previous day: 11/19 0701 - 11/20 0700 In: -  Out: 425 [Urine:275; Stool:150] Intake/Output this shift: Total I/O In: 0  Out: 500 [Urine:500]  Lab Results: Recent Labs    12/12/23 0601 12/12/23 0609 12/12/23 1553 12/12/23 2043 12/13/23 0345  WBC 6.2  --   --   --   --   HGB 9.1*   < > 7.8* 7.7* 7.7*  HCT 29.7*   < > 26.1* 24.9* 24.9*  PLT 402*  --   --   --   --    < > = values in this interval not displayed.   BMET Recent Labs    12/12/23 0601 12/12/23 0609  NA 142 143  K 4.2 4.2  CL 111 112*  CO2 19*   --   GLUCOSE 120* 110*  BUN 33* 33*  CREATININE 0.83 1.00  CALCIUM  9.9  --    LFT Recent Labs    12/12/23 0601  PROT 5.7*  ALBUMIN  3.4*  AST 18  ALT 17  ALKPHOS 52  BILITOT 0.4   PT/INR No results for input(s): LABPROT, INR in the last 72 hours.  Studies/Results: CT Angio Abd/Pel W and/or Wo Contrast Result Date: 12/12/2023 EXAM: CTA ABDOMEN AND PELVIS WITHOUT AND WITH CONTRAST 12/12/2023 07:19:49 AM TECHNIQUE: CTA images of the abdomen and pelvis without and with intravenous contrast (iohexol  (OMNIPAQUE ) 350 MG/ML injection 75 mL IOHEXOL  350 MG/ML SOLN). Three-dimensional MIP/volume rendered formations were performed. Automated exposure control, iterative reconstruction, and/or weight based adjustment of the mA/kV was utilized to reduce the radiation dose to as low as reasonably achievable. COMPARISON: 10/25/2023 CLINICAL HISTORY: Lower GI bleed; red blood in colostomy. FINDINGS: VASCULATURE: No definite evidence of contrast extravasation is noted to suggest gastrointestinal bleeding. AORTA: No acute finding. No abdominal aortic aneurysm. No dissection. CELIAC TRUNK: No acute finding. No occlusion or significant stenosis. SUPERIOR MESENTERIC ARTERY: No acute finding. No occlusion or significant stenosis. RENAL ARTERIES: No acute finding. No occlusion or significant stenosis. ILIAC ARTERIES: No acute finding. No occlusion or significant stenosis. LIVER: The  liver is unremarkable. GALLBLADDER AND BILE DUCTS: Status post cholecystectomy. No biliary ductal dilatation. SPLEEN: The spleen is unremarkable. PANCREAS: The pancreas is unremarkable. ADRENAL GLANDS: Bilateral adrenal glands demonstrate no acute abnormality. KIDNEYS, URETERS AND BLADDER: Bilateral nonobstructive nephrolithiasis. No stones in the ureters. No hydronephrosis. No perinephric or periureteral stranding. Urinary bladder is unremarkable. GI AND BOWEL: Colostomy noted in left lower quadrant. Diverticulosis of the descending  colon without inflammation. 4.5 x 4.0 cm irregular mass seen in rectal region consistent with history of rectal malignancy. Stomach and duodenal sweep demonstrate no acute abnormality. There is no bowel obstruction. No abnormal bowel wall thickening or distension. REPRODUCTIVE: Reproductive organs are unremarkable. PERITONEUM AND RETRPERITONEUM: No ascites or free air. LUNG BASE: No acute abnormality. LYMPH NODES: No lymphadenopathy. BONES AND SOFT TISSUES: No acute abnormality of the bones. No acute soft tissue abnormality. IMPRESSION: 1. No definite evidence of active gastrointestinal bleeding on this CTA. 2. Irregular 4.5 x 4.0 cm rectal mass, compatible with known rectal malignancy. Electronically signed by: Lynwood Seip MD 12/12/2023 07:44 AM EST RP Workstation: [id number]       Assessment / Plan:    #10 68 year old white male with diagnosis of stage IV adenocarcinoma of the rectum/initial diagnosis 2015 status post radiation then low anterior resection with colostomy. He had recurrence with a left upper lobe lung mass 2020 treated with chemotherapy, then underwent left upper lobe lobectomy.  He has been on observation/surveillance per oncology since then with serial CTs and CEA's all of which have been stable. He had just had a CT October 2025 with stable rectal soft tissue/masslike component, no evidence of any metastatic disease CEA normal  Presented to the ER yesterday after onset of blood per ostomy earlier in the morning hours.  Had active dark red blood filling his ostomy bag a couple of times prior to coming to the emergency room and bleeding had subsided at the time of admission. Is also had a drift in his hemoglobin over the past month, found to be iron  deficient per oncology and just completed several IV iron  infusions.  Globin 10.78-month ago> 9.1 yesterday> 7.7 last p.m. and 7.7 earlier this a.m.  CTA was negative for any active bleeding, diverticulosis noted This may have been a  diverticular hemorrhage, cannot rule out subtle colonic lesion not seen on CTA  #2 iron  deficiency anemia/as above 3.  Hypertension 4.  Asthmatic bronchitis 5.  History of ureterolithiasis  Plan; continue clear liquids today, n.p.o. after midnight except prep Additional prep ordered for this afternoon and then late this evening if stool not clear Continue to trend hemoglobin and would transfuse if hemoglobin less than 7.5 Patient rescheduled for colonoscopy with Dr. San in a.m. tomorrow, plans discussed with the patient fully at bedside today    Principal Problem:   GIB (gastrointestinal bleeding) Active Problems:   Acute blood loss anemia   History of colon cancer   Hematochezia     LOS: 1 day   Amy EsterwoodPA-C  12/13/2023, 2:15 PM    Attending physician's note   I have taken a history, reviewed the chart, and examined the patient. I performed a substantive portion of this encounter, including complete performance of at least one of the key components, in conjunction with the APP. I agree with the APP's note, impression, and recommendations with my edits.   Was ordered for bowel prep which should have been started early last evening, but unfortunately he never received any prep until after midnight.  He drank the  entire prep as directed, but still with dark stools.  Colonoscopy was therefore postponed with plan to have this done tomorrow.  Understandably upset.  H/H otherwise largely stable at 7.7/25, but overall downtrend from 1 month ago and from admission.  - CLD today with n.p.o. at midnight except for prep - Ordered for more prep early this afternoon and if stools not clear, already has prep in place for midnight tonight.  To drink each over 1 hour - Serial CBC checks with blood products if Hgb <7.5 given plan for sedation - Plan for colonoscopy +/- EGD tomorrow morning for diagnostic and potentially therapeutic intent  Sandor Flatter, DO, FACG [phone]  office

## 2023-12-14 ENCOUNTER — Encounter (HOSPITAL_COMMUNITY): Payer: Self-pay | Admitting: Certified Registered Nurse Anesthetist

## 2023-12-14 ENCOUNTER — Encounter (HOSPITAL_COMMUNITY): Payer: Self-pay | Admitting: Hospitalist

## 2023-12-14 ENCOUNTER — Inpatient Hospital Stay (HOSPITAL_COMMUNITY): Payer: Self-pay | Admitting: Certified Registered Nurse Anesthetist

## 2023-12-14 ENCOUNTER — Other Ambulatory Visit (HOSPITAL_COMMUNITY): Payer: Self-pay

## 2023-12-14 ENCOUNTER — Encounter (HOSPITAL_COMMUNITY)
Admission: EM | Disposition: A | Discharge status: Home / Self Care | Payer: Self-pay | Source: Home / Self Care | Attending: Emergency Medicine

## 2023-12-14 DIAGNOSIS — K2289 Other specified disease of esophagus: Secondary | ICD-10-CM | POA: Diagnosis not present

## 2023-12-14 DIAGNOSIS — I10 Essential (primary) hypertension: Secondary | ICD-10-CM

## 2023-12-14 DIAGNOSIS — D122 Benign neoplasm of ascending colon: Secondary | ICD-10-CM

## 2023-12-14 DIAGNOSIS — K229 Disease of esophagus, unspecified: Secondary | ICD-10-CM | POA: Diagnosis not present

## 2023-12-14 DIAGNOSIS — Z87891 Personal history of nicotine dependence: Secondary | ICD-10-CM

## 2023-12-14 DIAGNOSIS — D509 Iron deficiency anemia, unspecified: Secondary | ICD-10-CM

## 2023-12-14 DIAGNOSIS — B3781 Candidal esophagitis: Secondary | ICD-10-CM

## 2023-12-14 DIAGNOSIS — K5730 Diverticulosis of large intestine without perforation or abscess without bleeding: Secondary | ICD-10-CM

## 2023-12-14 DIAGNOSIS — K921 Melena: Secondary | ICD-10-CM | POA: Diagnosis not present

## 2023-12-14 DIAGNOSIS — K922 Gastrointestinal hemorrhage, unspecified: Secondary | ICD-10-CM | POA: Diagnosis not present

## 2023-12-14 HISTORY — PX: ESOPHAGOGASTRODUODENOSCOPY: SHX5428

## 2023-12-14 HISTORY — PX: COLONOSCOPY: SHX5424

## 2023-12-14 LAB — BASIC METABOLIC PANEL WITH GFR
Anion gap: 14 (ref 5–15)
BUN: 8 mg/dL (ref 8–23)
CO2: 20 mmol/L — ABNORMAL LOW (ref 22–32)
Calcium: 8.1 mg/dL — ABNORMAL LOW (ref 8.9–10.3)
Chloride: 108 mmol/L (ref 98–111)
Creatinine, Ser: 0.95 mg/dL (ref 0.61–1.24)
GFR, Estimated: >60 mL/min (ref >60)
Glucose, Bld: 98 mg/dL (ref 70–99)
Potassium: 3.5 mmol/L (ref 3.5–5.1)
Sodium: 142 mmol/L (ref 135–145)

## 2023-12-14 LAB — CBC WITH DIFFERENTIAL/PLATELET
Abs Immature Granulocytes: 0.02 K/uL (ref 0.00–0.07)
Basophils Absolute: 0.1 K/uL (ref 0.0–0.1)
Basophils Relative: 2 %
Eosinophils Absolute: 0.2 K/uL (ref 0.0–0.5)
Eosinophils Relative: 4 %
HCT: 25.7 % — ABNORMAL LOW (ref 39.0–52.0)
Hemoglobin: 7.8 g/dL — ABNORMAL LOW (ref 13.0–17.0)
Immature Granulocytes: 0 %
Lymphocytes Relative: 22 %
Lymphs Abs: 1 K/uL (ref 0.7–4.0)
MCH: 26.1 pg (ref 26.0–34.0)
MCHC: 30.4 g/dL (ref 30.0–36.0)
MCV: 86 fL (ref 80.0–100.0)
Monocytes Absolute: 0.5 K/uL (ref 0.1–1.0)
Monocytes Relative: 11 %
Neutro Abs: 2.9 K/uL (ref 1.7–7.7)
Neutrophils Relative %: 61 %
Platelets: 349 K/uL (ref 150–400)
RBC: 2.99 MIL/uL — ABNORMAL LOW (ref 4.22–5.81)
RDW: 21.3 % — ABNORMAL HIGH (ref 11.5–15.5)
Smear Review: NORMAL
WBC: 4.7 K/uL (ref 4.0–10.5)
nRBC: 0 % (ref 0.0–0.2)

## 2023-12-14 SURGERY — COLONOSCOPY
Anesthesia: Monitor Anesthesia Care

## 2023-12-14 MED ORDER — FLUCONAZOLE 200 MG PO TABS
200.0000 mg | ORAL_TABLET | Freq: Once | ORAL | Status: AC
  Administered 2023-12-14: 200 mg via ORAL
  Filled 2023-12-14: qty 1

## 2023-12-14 MED ORDER — FLUCONAZOLE 100 MG PO TABS
100.0000 mg | ORAL_TABLET | Freq: Every day | ORAL | Status: DC
Start: 2023-12-15 — End: 2023-12-14

## 2023-12-14 MED ORDER — PROPOFOL 10 MG/ML IV BOLUS
INTRAVENOUS | Status: DC | PRN
  Administered 2023-12-14: 60 mg via INTRAVENOUS

## 2023-12-14 MED ORDER — FLUCONAZOLE 100 MG PO TABS
100.0000 mg | ORAL_TABLET | Freq: Every day | ORAL | 0 refills | Status: AC
  Filled 2023-12-14: qty 13, 13d supply, fill #0

## 2023-12-14 MED ORDER — PANTOPRAZOLE SODIUM 40 MG PO TBEC
40.0000 mg | DELAYED_RELEASE_TABLET | Freq: Every day | ORAL | Status: DC
  Administered 2023-12-14: 40 mg via ORAL
  Filled 2023-12-14: qty 1

## 2023-12-14 MED ORDER — PANTOPRAZOLE SODIUM 40 MG PO TBEC
40.0000 mg | DELAYED_RELEASE_TABLET | Freq: Every day | ORAL | 0 refills | Status: AC
  Filled 2023-12-14: qty 14, 14d supply, fill #0

## 2023-12-14 MED ORDER — LIDOCAINE 2% (20 MG/ML) 5 ML SYRINGE
INTRAMUSCULAR | Status: DC | PRN
  Administered 2023-12-14: 60 mg via INTRAVENOUS

## 2023-12-14 MED ORDER — HEPARIN SOD (PORK) LOCK FLUSH 100 UNIT/ML IV SOLN
500.0000 [IU] | INTRAVENOUS | Status: AC | PRN
  Administered 2023-12-14: 500 [IU]

## 2023-12-14 MED ORDER — ALBUTEROL SULFATE HFA 108 (90 BASE) MCG/ACT IN AERS
INHALATION_SPRAY | RESPIRATORY_TRACT | Status: DC | PRN
  Administered 2023-12-14: 2 via RESPIRATORY_TRACT

## 2023-12-14 MED ORDER — PROPOFOL 500 MG/50ML IV EMUL
INTRAVENOUS | Status: DC | PRN
  Administered 2023-12-14: 125 ug/kg/min via INTRAVENOUS

## 2023-12-14 NOTE — Transfer of Care (Signed)
 Immediate Anesthesia Transfer of Care Note  Patient: [name] [name]  Procedure(s) Performed: COLONOSCOPY EGD (ESOPHAGOGASTRODUODENOSCOPY)  Patient Location: PACU and Endoscopy Unit  Anesthesia Type:MAC  Level of Consciousness: drowsy  Airway & Oxygen Therapy: Patient Spontanous Breathing  Post-op Assessment: Report given to RN and Post -op Vital signs reviewed and stable  Post vital signs: Reviewed and stable  Last Vitals:  Vitals Value Taken Time  BP    Temp    Pulse 60 12/14/23 08:23  Resp    SpO2 98 % 12/14/23 08:23  Vitals shown include unfiled device data.  Last Pain:  Vitals:   12/14/23 0712  TempSrc: Temporal  PainSc: 0-No pain         Complications: No notable events documented.

## 2023-12-14 NOTE — Op Note (Signed)
 [facility] Patient Name: [name] [name] Procedure Date : 12/14/2023 MRN: [id number] Attending MD: Sandor Flatter , MD, [id number] Date of Birth: [birth year] CSN: [id number] Age: 68 Admit Type: Inpatient Procedure:                Colonoscopy Indications:              Hematochezia, Iron  deficiency anemia Providers:                Sandor Flatter, MD, Ozell Pouch, [facility]                            [place], Technician, Delon SQUIBB, CRNA Referring MD:              Medicines:                Monitored Anesthesia Care Complications:            No immediate complications. Estimated Blood Loss:     Estimated blood loss was minimal. Procedure:                Pre-Anesthesia Assessment:                           - Prior to the procedure, a History and Physical                            was performed, and patient medications and                            allergies were reviewed. The patient's tolerance of                            previous anesthesia was also reviewed. The risks                            and benefits of the procedure and the sedation                            options and risks were discussed with the patient.                            All questions were answered, and informed consent                            was obtained. Prior Anticoagulants: The patient has                            taken no anticoagulant or antiplatelet agents. ASA                            Grade Assessment: III - A patient with severe                            systemic disease. After reviewing the risks and  benefits, the patient was deemed in satisfactory                            condition to undergo the procedure.                           After obtaining informed consent, the colonoscope                            was passed under direct vision. Throughout the                            procedure, the patient's blood pressure, pulse, and                             oxygen saturations were monitored continuously. The                            [id number]-HQ190L ([id number]) Olympus colonoscope was                            introduced through the sigmoid colostomy and                            advanced to the 10 cm into the ileum. The                            colonoscopy was performed without difficulty. The                            patient tolerated the procedure well. The quality                            of the bowel preparation was excellent. The                            terminal ileum, the ileocecal valve and the                            appendiceal orifice were photographed. Scope In: 7:51:01 AM Scope Out: 8:13:45 AM Scope Withdrawal Time: 0 hours 15 minutes 13 seconds  Total Procedure Duration: 0 hours 22 minutes 44 seconds  Findings:      A 4 mm polyp was found in the ascending colon. The polyp was sessile.       The polyp was removed with a cold snare. Resection and retrieval were       complete. Estimated blood loss was minimal.      Multiple small-mouthed diverticula were found in the sigmoid colon,       descending colon and transverse colon. Lavage of the area was performed       using a large amount of sterile water, resulting in clearance with       excellent visualization.      The terminal ileum appeared normal. Impression:               -  Healthy appearing ostomy.                           - One 4 mm polyp in the ascending colon, removed                            with a cold snare. Resected and retrieved.                           - Diverticulosis in the sigmoid colon, in the                            descending colon and in the transverse colon.                           - The examined portion of the ileum was normal. Recommendation:           - Await pathology results.                           - Perform an upper GI endoscopy today.                           - Additional recommendations for ongoing inpatient                             care pending EGD findings. Procedure Code(s):        --- Professional ---                           [id number], Colonoscopy through stoma; with removal of                            tumor(s), polyp(s), or other lesion(s) by snare                            technique Diagnosis Code(s):        --- Professional ---                           D12.2, Benign neoplasm of ascending colon                           K92.1, Melena (includes Hematochezia)                           D50.9, Iron  deficiency anemia, unspecified                           K57.30, Diverticulosis of large intestine without                            perforation or abscess without bleeding CPT copyright 2022 [employer]. All rights reserved. The codes documented in this report are preliminary and upon coder review may  be revised to meet current compliance requirements. Sandor Flatter, MD 12/14/2023 8:39:40 AM  Number of Addenda: 0

## 2023-12-14 NOTE — Progress Notes (Signed)
   12/14/23 1225  Obs Status Letter  Medicare Obs Status Letter given? Yes  Condition Code 44 Given: Yes  Patient signature on CC44 notice: Yes  Code 44 added to claim: Yes

## 2023-12-14 NOTE — Anesthesia Postprocedure Evaluation (Signed)
 Anesthesia Post Note  Patient: [name] [name]  Procedure(s) Performed: COLONOSCOPY EGD (ESOPHAGOGASTRODUODENOSCOPY)     Patient location during evaluation: PACU Anesthesia Type: MAC Level of consciousness: awake and alert Pain management: pain level controlled Vital Signs Assessment: post-procedure vital signs reviewed and stable Respiratory status: spontaneous breathing, nonlabored ventilation and respiratory function stable Cardiovascular status: stable and blood pressure returned to baseline Anesthetic complications: no   No notable events documented.  Last Vitals:  Vitals:   12/14/23 0840 12/14/23 0845  BP: 131/68   Pulse: (!) 59 67  Resp: (!) 23 15  Temp:    SpO2: 98% 100%    Last Pain:  Vitals:   12/14/23 1104  TempSrc:   PainSc: 0-No pain                 Debby FORBES Like

## 2023-12-14 NOTE — Care Management Obs Status (Signed)
 MEDICARE OBSERVATION STATUS NOTIFICATION   Patient Details  Name: [name] [name] MRN: [id number] Date of Birth: [birth year]   Medicare Observation Status Notification Given:  Yes    Nola Devere Hands, RN 12/14/2023, 12:25 PM

## 2023-12-14 NOTE — Interval H&P Note (Signed)
 History and Physical Interval Note:  12/14/2023 7:41 AM  [name] [name]  has presented today for surgery, with the diagnosis of hematochezia, acute blood loss anemia.  The various methods of treatment have been discussed with the patient and family. After consideration of risks, benefits and other options for treatment, the patient has consented to  Procedure(s): COLONOSCOPY (N/A) EGD (ESOPHAGOGASTRODUODENOSCOPY) (N/A) as a surgical intervention.  The patient's history has been reviewed, patient examined, no change in status, stable for surgery.  I have reviewed the patient's chart and labs.  Questions were answered to the patient's satisfaction.     Sandor GAILS [name]

## 2023-12-14 NOTE — Progress Notes (Signed)
 VAST consult received to obtain labs from port. Upon arrival at patient's bedside patient reported that he spoke with physician and is being discharged. Reached out to Dahal, MD regarding need for labs and permission to flush and de-access port at this time. Dahal responded that labs were not needed and okay to flush and de-access port now. Unit RN notified.

## 2023-12-14 NOTE — Plan of Care (Signed)
  Problem: Education: Goal: Knowledge of General Education information will improve Description: Including pain rating scale, medication(s)/side effects and non-pharmacologic comfort measures Outcome: Progressing   Problem: Clinical Measurements: Goal: Will remain free from infection Outcome: Progressing Goal: Respiratory complications will improve Outcome: Progressing   Problem: Activity: Goal: Risk for activity intolerance will decrease Outcome: Progressing   Problem: Coping: Goal: Level of anxiety will decrease Outcome: Progressing   Problem: Pain Managment: Goal: General experience of comfort will improve and/or be controlled Outcome: Progressing

## 2023-12-14 NOTE — Discharge Summary (Signed)
 Physician Discharge Summary  [name] [name] FMW:[id number] DOB: Mar 11, 1955 DOA: 12/12/2023  PCP: Sowles, Krichna, MD  Admit date: 12/12/2023 Discharge date: 12/14/2023  Admitted from: Home Discharge disposition: Home  Recommendations at discharge:  You have been started on Diflucan  daily for [redacted] weeks along with Protonix  for esophageal candidiasis Follow-up with GI as an outpatient for biopsy result of endoscopy and colonoscopy Continue to follow-up with GI and oncology as an outpatient    Subjective: Patient was seen and examined this morning. Underwent colonoscopy through ostomy as well as today, findings as below Lying down in bed.  Not in distress.  Wife at bedside.  Eager to go home. Afebrile, hemodynamically stable Several CBC readings in the last 24 hours show hemoglobin consistently between 7 and 8  Brief narrative: [name] [name] is a 68 y.o. male with PMH significant for stage IV rectal adenocarcinoma s/p APR colostomy (2015) and LUL lobectomy for lung mets from rectal cancer 2021 (follows w/ Dr. Jacobo), HTN, HLD, chronic iron  deficiency anemia, and galucoma. 11/19, patient was brought to the ED by EMS with concern of bloody output from colostomy bag.  When he woke up in the morning, he noticed that his ostomy bag was black.  His wife helped him remove the bag.  He had active bleeding for 5 to 8 minutes.  Estimated blood loss about one and half bag. Upon standing, the patient experienced dizziness and shortness of breath.  EMS was called and brought to ED. Patient has had colostomy bag for last 10 years and never had similar bleeding in the past. For iron  deficiency anemia, patient has been treated with iron  transfusions, with the last one being two weeks ago.   In the ED, afebrile, hemodynamically stable. Labs notable for Hb 9.1-->9.5 (baseline 13-15).  CTA abd/pelvis was neg for active GIB, but showed known irregular 4.5 x 4.0 cm rectal mass.   Admitted to [name]  consulted  11/21, underwent colonoscopy through ostomy as well as EGD Colonoscopy showed healthy appearing ostomy; one 4 mm polyp in the ascending colon, removed with a cold snare. Diverticulosis in the sigmoid colon, in the descending colon and in the transverse colon.  No active bleeding or stigmata of bleeding.  Biopsy taken.  Per GI, patient probably had a brisk diverticular bleed that stopped spontaneously.  EGD showed esophageal plaques suspicious for candidiasis.           Hospital course: Acute GI bleeding Presented with acute bright red blood from colostomy 11/21, underwent colonoscopy through ostomy as well as EGD Colonoscopy showed healthy appearing ostomy; one 4 mm polyp in the ascending colon, removed with a cold snare. Diverticulosis in the sigmoid colon, in the descending colon and in the transverse colon.  No active bleeding or stigmata of bleeding.  Biopsy taken.  Per GI, patient probably had a brisk diverticular bleed that stopped spontaneously. Follow-up with GI as an outpatient for biopsy result  Acute blood loss anemia  Chronic iron  deficiency anemia Baseline hemoglobin above 11.   Presented with hemoglobin of 9.5 which further down trended to 7.7 due to acute GI bleeding as discussed above GI bleeding has stopped.  Hemoglobin has stabilized between 7 and 8.  Did not require blood transfusion. Receives iron  infusion as an outpatient. Recent Labs    04/09/23 0816 10/25/23 0852 11/07/23 1020 12/12/23 0601 12/12/23 0609 12/12/23 1553 12/12/23 2043 12/13/23 0345 12/13/23 1959 12/14/23 0145  HGB 11.2*   < > 9.2* 9.1*   < >  7.8* 7.7* 7.7* 7.2* 7.8*  MCV 84.5   < > 76.7* 85.3  --   --   --   --   --  86.0  FERRITIN 11*  --  8*  --   --   --   --   --   --   --   TIBC 403  --  391  --   --   --   --   --   --   --   IRON  25*  --  21*  --   --   --   --   --   --   --    < > = values in this interval not displayed.   Esophageal candidiasis  11/21 EGD showed  esophageal plaques suspicious for candidiasis. He has been started on Diflucan  daily for [redacted] weeks along with Protonix  Follow-up with GI as an outpatient for biopsy result  Stage IV rectal adenocarcinoma  s/p APR colostomy (2015) and LUL lobectomy for lung mets from rectal cancer 2021  CT abdomen with known stable rectal mass Follows w/ oncologist Dr. Jacobo at [facility].  HTN Hemodynamically stable currently  Continue amlodipine  10 mg daily as before  Mobility: Independently able to ambulate  Goals of care   Code Status: Full Code   Diet:  Diet Order             DIET SOFT Room service appropriate? Yes; Fluid consistency: Thin  Diet effective now           Diet general                   Nutritional status:  Body mass index is 33.5 kg/m.       Wounds:  -    Discharge Medications:   Allergies as of 12/14/2023       Reactions   Sulfa Antibiotics Hives   Gabapentin  Diarrhea   Morphine  And Codeine Other (See Comments)   Altered mental status        Medication List     STOP taking these medications    azithromycin  250 MG tablet Commonly known as: ZITHROMAX        TAKE these medications    acetaminophen  500 MG tablet Commonly known as: TYLENOL  Take 1,000 mg by mouth every 6 (six) hours as needed for mild pain.   Airsupra  90-80 MCG/ACT Aero Generic drug: Albuterol -Budesonide  Inhale 2 puffs into the lungs every 6 (six) hours as needed.   amLODipine  10 MG tablet Commonly known as: NORVASC  Take 1 tablet (10 mg total) by mouth daily.   calcium  carbonate 500 MG chewable tablet Commonly known as: TUMS - dosed in mg elemental calcium  Chew 2 tablets by mouth as needed for indigestion or heartburn.   cetirizine 10 MG tablet Commonly known as: ZYRTEC Take 10 mg by mouth at bedtime.   cyclobenzaprine  10 MG tablet Commonly known as: FLEXERIL  TAKE 1 TABLET BY MOUTH THREE TIMES A DAY AS NEEDED FOR MUSCLE SPASM What changed:  how much to take how  to take this when to take this additional instructions   fluconazole  100 MG tablet Commonly known as: DIFLUCAN  Take 1 tablet (100 mg total) by mouth daily. Start taking on: December 15, 2023   loperamide  2 MG capsule Commonly known as: IMODIUM  Take 4 mg by mouth as needed for diarrhea or loose stools.   multivitamin with minerals Tabs tablet Take 1 tablet by mouth every evening.   pantoprazole  40 MG  tablet Commonly known as: PROTONIX  Take 1 tablet (40 mg total) by mouth daily before breakfast for 14 days.   tadalafil  20 MG tablet Commonly known as: CIALIS  TAKE 1 TAB BY MOUTH 1 HOUR PRIOR TO INTERCOURSE AS NEEDED   Wixela Inhub 250-50 MCG/ACT Aepb Generic drug: fluticasone -salmeterol INHALE 1 PUFF INTO THE LUNGS IN THE MORNING AND AT BEDTIME.         Follow ups:    Follow-up Information     Sowles, Krichna, MD Follow up.   Specialty: Family Medicine Contact information: [address] [place] [address] [phone]                 Discharge Instructions:   Discharge Instructions     Call MD for:  difficulty breathing, headache or visual disturbances   Complete by: As directed    Call MD for:  extreme fatigue   Complete by: As directed    Call MD for:  hives   Complete by: As directed    Call MD for:  persistant dizziness or light-headedness   Complete by: As directed    Call MD for:  persistant nausea and vomiting   Complete by: As directed    Call MD for:  severe uncontrolled pain   Complete by: As directed    Call MD for:  temperature >100.4   Complete by: As directed    Diet general   Complete by: As directed    Discharge instructions   Complete by: As directed    Recommendations at discharge:   You have been started on Diflucan  daily for [redacted] weeks along with Protonix  for esophageal candidiasis  Follow-up with GI as an outpatient for biopsy result of endoscopy and colonoscopy  Continue to follow-up with GI and oncology  as an outpatient  General discharge instructions: Follow with Primary MD Glenard Mire, MD in 7 days  Please request your PCP  to go over your hospital tests, procedures, radiology results at the follow up. Please get your medicines reviewed and adjusted.  Your PCP may decide to repeat certain labs or tests as needed. Do not drive, operate heavy machinery, perform activities at heights, swimming or participation in water activities or provide baby sitting services if your were admitted for syncope or siezures until you have seen by Primary MD or a Neurologist and advised to do so again.   Controlled Substance Reporting System database was reviewed. Do not drive, operate heavy machinery, perform activities at heights, swim, participate in water activities or provide baby-sitting services while on medications for pain, sleep and mood until your outpatient physician has reevaluated you and advised to do so again.  You are strongly recommended to comply with the dose, frequency and duration of prescribed medications. Activity: As tolerated with Full fall precautions use walker/cane & assistance as needed Avoid using any recreational substances like cigarette, tobacco, alcohol, or non-prescribed drug. If you experience worsening of your admission symptoms, develop shortness of breath, life threatening emergency, suicidal or homicidal thoughts you must seek medical attention immediately by calling 911 or calling your MD immediately  if symptoms less severe. You must read complete instructions/literature along with all the possible adverse reactions/side effects for all the medicines you take and that have been prescribed to you. Take any new medicine only after you have completely understood and accepted all the possible adverse reactions/side effects.  Wear Seat belts while driving. You were cared for by a hospitalist during your hospital stay. If you  have any questions about your discharge  medications or the care you received while you were in the hospital after you are discharged, you can call the unit and ask to speak with the hospitalist or the covering physician. Once you are discharged, your primary care physician will handle any further medical issues. Please note that NO REFILLS for any discharge medications will be authorized once you are discharged, as it is imperative that you return to your primary care physician (or establish a relationship with a primary care physician if you do not have one).   Increase activity slowly   Complete by: As directed        Discharge Exam:   Vitals:   12/14/23 0830 12/14/23 0835 12/14/23 0840 12/14/23 0845  BP: 119/74  131/68   Pulse: 67 63 (!) 59 67  Resp: 17 12 (!) 23 15  Temp:      TempSrc:      SpO2: 99% 99% 98% 100%  Weight:      Height:        Body mass index is 33.5 kg/m.  General exam: Pleasant, elderly Caucasian male Skin: No rashes, lesions or ulcers. HEENT: Atraumatic, normocephalic, no obvious bleeding Lungs: Clear to auscultation bilaterally,  CVS: S1, S2, no murmur,   GI/Abd: Soft, nontender, nondistended, bowel sound present CNS: Alert, awake, oriented x 3 Psychiatry: Mood appropriate Extremities: No pedal edema, no calf tenderness,    The results of significant diagnostics from this hospitalization (including imaging, microbiology, ancillary and laboratory) are listed below for reference.    Procedures and Diagnostic Studies:   CT Angio Abd/Pel W and/or Wo Contrast Result Date: 12/12/2023 EXAM: CTA ABDOMEN AND PELVIS WITHOUT AND WITH CONTRAST 12/12/2023 07:19:49 AM TECHNIQUE: CTA images of the abdomen and pelvis without and with intravenous contrast (iohexol  (OMNIPAQUE ) 350 MG/ML injection 75 mL IOHEXOL  350 MG/ML SOLN). Three-dimensional MIP/volume rendered formations were performed. Automated exposure control, iterative reconstruction, and/or weight based adjustment of the mA/kV was utilized to reduce  the radiation dose to as low as reasonably achievable. COMPARISON: 10/25/2023 CLINICAL HISTORY: Lower GI bleed; red blood in colostomy. FINDINGS: VASCULATURE: No definite evidence of contrast extravasation is noted to suggest gastrointestinal bleeding. AORTA: No acute finding. No abdominal aortic aneurysm. No dissection. CELIAC TRUNK: No acute finding. No occlusion or significant stenosis. SUPERIOR MESENTERIC ARTERY: No acute finding. No occlusion or significant stenosis. RENAL ARTERIES: No acute finding. No occlusion or significant stenosis. ILIAC ARTERIES: No acute finding. No occlusion or significant stenosis. LIVER: The liver is unremarkable. GALLBLADDER AND BILE DUCTS: Status post cholecystectomy. No biliary ductal dilatation. SPLEEN: The spleen is unremarkable. PANCREAS: The pancreas is unremarkable. ADRENAL GLANDS: Bilateral adrenal glands demonstrate no acute abnormality. KIDNEYS, URETERS AND BLADDER: Bilateral nonobstructive nephrolithiasis. No stones in the ureters. No hydronephrosis. No perinephric or periureteral stranding. Urinary bladder is unremarkable. GI AND BOWEL: Colostomy noted in left lower quadrant. Diverticulosis of the descending colon without inflammation. 4.5 x 4.0 cm irregular mass seen in rectal region consistent with history of rectal malignancy. Stomach and duodenal sweep demonstrate no acute abnormality. There is no bowel obstruction. No abnormal bowel wall thickening or distension. REPRODUCTIVE: Reproductive organs are unremarkable. PERITONEUM AND RETRPERITONEUM: No ascites or free air. LUNG BASE: No acute abnormality. LYMPH NODES: No lymphadenopathy. BONES AND SOFT TISSUES: No acute abnormality of the bones. No acute soft tissue abnormality. IMPRESSION: 1. No definite evidence of active gastrointestinal bleeding on this CTA. 2. Irregular 4.5 x 4.0 cm rectal mass, compatible with known rectal malignancy.  Electronically signed by: Lynwood Seip MD 12/12/2023 07:44 AM EST RP Workstation:  [id number]     Labs:   Basic Metabolic Panel: Recent Labs  Lab 12/12/23 0601 12/12/23 0609 12/14/23 0145  NA 142 143 142  K 4.2 4.2 3.5  CL 111 112* 108  CO2 19*  --  20*  GLUCOSE 120* 110* 98  BUN 33* 33* 8  CREATININE 0.83 1.00 0.95  CALCIUM  9.9  --  8.1*   GFR Estimated Creatinine Clearance: 104.5 mL/min (by C-G formula based on SCr of 0.95 mg/dL). Liver Function Tests: Recent Labs  Lab 12/12/23 0601  AST 18  ALT 17  ALKPHOS 52  BILITOT 0.4  PROT 5.7*  ALBUMIN  3.4*   No results for input(s): LIPASE, AMYLASE in the last 168 hours. No results for input(s): AMMONIA in the last 168 hours. Coagulation profile No results for input(s): INR, PROTIME in the last 168 hours.  CBC: Recent Labs  Lab 12/12/23 0601 12/12/23 0609 12/12/23 1553 12/12/23 2043 12/13/23 0345 12/13/23 1959 12/14/23 0145  WBC 6.2  --   --   --   --   --  4.7  NEUTROABS  --   --   --   --   --   --  2.9  HGB 9.1*   < > 7.8* 7.7* 7.7* 7.2* 7.8*  HCT 29.7*   < > 26.1* 24.9* 24.9* 23.0* 25.7*  MCV 85.3  --   --   --   --   --  86.0  PLT 402*  --   --   --   --   --  349   < > = values in this interval not displayed.   Cardiac Enzymes: No results for input(s): CKTOTAL, CKMB, CKMBINDEX, TROPONINI in the last 168 hours. BNP: Invalid input(s): POCBNP CBG: No results for input(s): GLUCAP in the last 168 hours. D-Dimer No results for input(s): DDIMER in the last 72 hours. Hgb A1c No results for input(s): HGBA1C in the last 72 hours. Lipid Profile No results for input(s): CHOL, HDL, LDLCALC, TRIG, CHOLHDL, LDLDIRECT in the last 72 hours. Thyroid  function studies No results for input(s): TSH, T4TOTAL, T3FREE, THYROIDAB in the last 72 hours.  Invalid input(s): FREET3 Anemia work up No results for input(s): VITAMINB12, FOLATE, FERRITIN, TIBC, IRON , RETICCTPCT in the last 72 hours. Microbiology Recent Results (from the past 240  hours)  Surgical PCR screen     Status: None   Collection Time: 12/13/23  6:15 AM   Specimen: Nasal Mucosa; Nasal Swab  Result Value Ref Range Status   MRSA, PCR NEGATIVE NEGATIVE Final   Staphylococcus aureus NEGATIVE NEGATIVE Final    Comment: (NOTE) The Xpert SA Assay (FDA approved for NASAL specimens in patients 93 years of age and older), is one component of a comprehensive surveillance program. It is not intended to diagnose infection nor to guide or monitor treatment. Performed at [facility], [address], [place], [place] [address]     Time coordinating discharge: 45 minutes  Signed: Grey Rakestraw  [facility] 12/14/2023, 11:41 AM

## 2023-12-14 NOTE — Op Note (Signed)
 [facility] Patient Name: [name] [name] Procedure Date : 12/14/2023 MRN: [id number] Attending MD: Sandor Flatter , MD, [id number] Date of Birth: 12-09-1955 CSN: [id number] Age: 68 Admit Type: Inpatient Procedure:                Upper GI endoscopy Indications:              Iron  deficiency anemia, Hematochezia Providers:                Sandor Flatter, MD, Ozell Pouch, [facility]                            [place], Technician, Delon SQUIBB, CRNA Referring MD:              Medicines:                Monitored Anesthesia Care Complications:            No immediate complications. Estimated Blood Loss:     Estimated blood loss was minimal. Procedure:                Pre-Anesthesia Assessment:                           - Prior to the procedure, a History and Physical                            was performed, and patient medications and                            allergies were reviewed. The patient's tolerance of                            previous anesthesia was also reviewed. The risks                            and benefits of the procedure and the sedation                            options and risks were discussed with the patient.                            All questions were answered, and informed consent                            was obtained. Prior Anticoagulants: The patient has                            taken no anticoagulant or antiplatelet agents. ASA                            Grade Assessment: III - A patient with severe                            systemic disease. After reviewing the risks and  benefits, the patient was deemed in satisfactory                            condition to undergo the procedure.                           After obtaining informed consent, the endoscope was                            passed under direct vision. Throughout the                            procedure, the patient's blood pressure, pulse, and                             oxygen saturations were monitored continuously. The                            [id number]-[id number] ([id number]) Olympus endoscope was introduced                            through the mouth, and advanced to the third part                            of duodenum. The upper GI endoscopy was                            accomplished without difficulty. The patient                            tolerated the procedure well. Scope In: Scope Out: Findings:      , white plaques were found in the middle third of the esophagus and in       the lower third of the esophagus. Biopsies were taken with a cold       forceps for histology. Estimated blood loss was minimal.      The entire examined stomach was normal.      The examined duodenum was normal. Impression:               - Esophageal plaques were found, suspicious for                            candidiasis. Biopsied.                           - Normal stomach.                           - Normal examined duodenum. Recommendation:           - Return patient to hospital ward for ongoing care.                           - Advance diet as tolerated.                           -  Diflucan  (fluconazole ) 200 mg x1 then 100 mg PO                            daily for 2 weeks.                           - Await pathology results. Procedure Code(s):        --- Professional ---                           [phone], Esophagogastroduodenoscopy, flexible,                            transoral; with biopsy, single or multiple Diagnosis Code(s):        --- Professional ---                           K22.9, Disease of esophagus, unspecified                           D50.9, Iron  deficiency anemia, unspecified                           K92.1, Melena (includes Hematochezia) CPT copyright 2022 [employer]. All rights reserved. The codes documented in this report are preliminary and upon coder review may  be revised to meet current compliance  requirements. Sandor Flatter, MD 12/14/2023 8:42:15 AM Number of Addenda: 0

## 2023-12-16 ENCOUNTER — Encounter: Payer: Self-pay | Admitting: Oncology

## 2023-12-16 ENCOUNTER — Encounter (HOSPITAL_COMMUNITY): Payer: Self-pay | Admitting: Gastroenterology

## 2023-12-17 ENCOUNTER — Telehealth: Payer: Self-pay | Admitting: *Deleted

## 2023-12-17 ENCOUNTER — Telehealth: Payer: Self-pay

## 2023-12-17 DIAGNOSIS — D509 Iron deficiency anemia, unspecified: Secondary | ICD-10-CM

## 2023-12-17 LAB — SURGICAL PATHOLOGY

## 2023-12-17 NOTE — Transitions of Care (Post Inpatient/ED Visit) (Signed)
 12/17/2023  Name: [name] [name] MRN: [id number] DOB: Sep 04, 1955  Today's TOC FU Call Status: Today's TOC FU Call Status:: Successful TOC FU Call Completed TOC FU Call Complete Date: 12/17/23  Patient's Name and Date of Birth confirmed. Name, DOB  Transition Care Management Follow-up Telephone Call Date of Discharge: 12/14/23 Discharge Facility: [facility]) Type of Discharge: Inpatient Admission Primary Inpatient Discharge Diagnosis:: gi bleed How have you been since you were released from the hospital?: Better Any questions or concerns?: No  Items Reviewed: Did you receive and understand the discharge instructions provided?: Yes Medications obtained,verified, and reconciled?: Yes (Medications Reviewed) Any new allergies since your discharge?: No Dietary orders reviewed?: Yes Do you have support at home?: Yes People in Home [RPT]: spouse  Medications Reviewed Today: Medications Reviewed Today     Reviewed by Emmitt Pan, LPN (Licensed Practical Nurse) on 12/17/23 at 1445  Med List Status: <None>   Medication Order Taking? Sig Documenting Provider Last Dose Status Informant  acetaminophen  (TYLENOL ) 500 MG tablet [id number] Yes Take 1,000 mg by mouth every 6 (six) hours as needed for mild pain. [provider]  Active Self, Pharmacy Records  Albuterol -Budesonide  (AIRSUPRA ) 90-80 MCG/ACT AERO [id number] Yes Inhale 2 puffs into the lungs every 6 (six) hours as needed. Tamea Dedra LITTIE, MD  Active Self, Pharmacy Records  amLODipine  (NORVASC ) 10 MG tablet [id number] Yes Take 1 tablet (10 mg total) by mouth daily. Sowles, Krichna, MD  Active Self, Pharmacy Records  calcium  carbonate (TUMS - DOSED IN MG ELEMENTAL CALCIUM ) 500 MG chewable tablet [id number] Yes Chew 2 tablets by mouth as needed for indigestion or heartburn. [provider]  Active Self, Pharmacy Records  cetirizine (ZYRTEC) 10 MG tablet [id number] Yes Take 10 mg by mouth at bedtime.  [provider]  Active Self, Pharmacy Records  cyclobenzaprine  (FLEXERIL ) 10 MG tablet [id number] Yes TAKE 1 TABLET BY MOUTH THREE TIMES A DAY AS NEEDED FOR MUSCLE SPASM  Patient taking differently: Take 10 mg by mouth at bedtime.   Sowles, Krichna, MD  Active Self, Pharmacy Records  fluconazole  (DIFLUCAN ) 100 MG tablet [id number] Yes Take 1 tablet (100 mg total) by mouth daily. Arlice Reichert, MD  Active   heparin  lock flush 100 unit/mL [id number]   Finnegan, Timothy J, MD  Active   loperamide  (IMODIUM ) 2 MG capsule [id number] Yes Take 4 mg by mouth as needed for diarrhea or loose stools.  [provider]  Active Self, Pharmacy Records  Multiple Vitamin (MULTIVITAMIN WITH MINERALS) TABS tablet [id number] Yes Take 1 tablet by mouth every evening. [provider]  Active Self, Pharmacy Records  pantoprazole  (PROTONIX ) 40 MG tablet [id number] Yes Take 1 tablet (40 mg total) by mouth daily before breakfast for 14 days. Arlice Reichert, MD  Active   sodium chloride  flush (NS) 0.9 % injection 10 mL [id number]   Jacobo Evalene PARAS, MD  Active   tadalafil  (CIALIS ) 20 MG tablet [id number]  TAKE 1 TAB BY MOUTH 1 HOUR PRIOR TO INTERCOURSE AS NEEDED Twylla Glendia BROCKS, MD  Expired 11/07/23 2359   WIXELA INHUB 250-50 MCG/ACT AEPB [id number] Yes INHALE 1 PUFF INTO THE LUNGS IN THE MORNING AND AT BEDTIME. Tamea Dedra LITTIE, MD  Active Self, Pharmacy Records            Home Care and Equipment/Supplies: Were Home Health Services Ordered?: NA Any new equipment or medical supplies ordered?: NA  Functional Questionnaire: Do you need assistance with bathing/showering or dressing?: No Do you need  assistance with meal preparation?: No Do you need assistance with eating?: No Do you have difficulty maintaining continence: No Do you need assistance with getting out of bed/getting out of a chair/moving?: No Do you have difficulty managing or taking your medications?: No  Follow up appointments  reviewed: PCP Follow-up appointment confirmed?: No (declined) MD Provider Line Number:[phone] Given: No Specialist Hospital Follow-up appointment confirmed?: Yes Date of Specialist follow-up appointment?: 12/19/23 Follow-Up Specialty Provider:: onco Do you need transportation to your follow-up appointment?: No Do you understand care options if your condition(s) worsen?: Yes-patient verbalized understanding    SIGNATURE Julian Lemmings, LPN [facility] Nurse Health Advisor Direct Dial [phone]

## 2023-12-17 NOTE — Telephone Encounter (Signed)
 FYI-Unable to add pt for IV venofer  this week due to high acuity.

## 2023-12-17 NOTE — Telephone Encounter (Signed)
 Patient is down for a port flush only on Wednesday. He is requesting repeat labs on Wednesday. He was recently in the hospital for GI bleed. Dr. Jacobo follows him for iron  def anemia.  Ok to add labs to port sch. Morna would also like to see the pt for HP follow-up on Wednesday. If there is a chair available, Morna would like pt to be scheduled for Venofer . Msg sent to scheduling re: the additional apts  labs added- cbc, metb, iron , ferr and hold tube

## 2023-12-19 ENCOUNTER — Inpatient Hospital Stay: Admitting: Nurse Practitioner

## 2023-12-19 ENCOUNTER — Inpatient Hospital Stay

## 2023-12-19 ENCOUNTER — Inpatient Hospital Stay: Attending: Oncology

## 2023-12-19 ENCOUNTER — Encounter: Payer: Self-pay | Admitting: Oncology

## 2023-12-19 ENCOUNTER — Encounter: Payer: Self-pay | Admitting: Nurse Practitioner

## 2023-12-19 VITALS — BP 137/75 | HR 93 | Temp 97.1°F | Resp 16 | Ht 75.0 in | Wt 267.3 lb

## 2023-12-19 VITALS — BP 139/79 | HR 93

## 2023-12-19 DIAGNOSIS — C20 Malignant neoplasm of rectum: Secondary | ICD-10-CM | POA: Diagnosis not present

## 2023-12-19 DIAGNOSIS — K208 Other esophagitis without bleeding: Secondary | ICD-10-CM | POA: Diagnosis not present

## 2023-12-19 DIAGNOSIS — B49 Unspecified mycosis: Secondary | ICD-10-CM | POA: Diagnosis not present

## 2023-12-19 DIAGNOSIS — K5792 Diverticulitis of intestine, part unspecified, without perforation or abscess without bleeding: Secondary | ICD-10-CM | POA: Diagnosis not present

## 2023-12-19 DIAGNOSIS — D509 Iron deficiency anemia, unspecified: Secondary | ICD-10-CM

## 2023-12-19 DIAGNOSIS — D508 Other iron deficiency anemias: Secondary | ICD-10-CM | POA: Diagnosis not present

## 2023-12-19 DIAGNOSIS — D500 Iron deficiency anemia secondary to blood loss (chronic): Secondary | ICD-10-CM | POA: Diagnosis not present

## 2023-12-19 LAB — CBC WITH DIFFERENTIAL (CANCER CENTER ONLY)
Abs Immature Granulocytes: 0.02 K/uL (ref 0.00–0.07)
Basophils Absolute: 0.1 K/uL (ref 0.0–0.1)
Basophils Relative: 1 %
Eosinophils Absolute: 0.2 K/uL (ref 0.0–0.5)
Eosinophils Relative: 5 %
HCT: 23.2 % — ABNORMAL LOW (ref 39.0–52.0)
Hemoglobin: 7.1 g/dL — ABNORMAL LOW (ref 13.0–17.0)
Immature Granulocytes: 1 %
Lymphocytes Relative: 26 %
Lymphs Abs: 0.9 K/uL (ref 0.7–4.0)
MCH: 25.2 pg — ABNORMAL LOW (ref 26.0–34.0)
MCHC: 30.6 g/dL (ref 30.0–36.0)
MCV: 82.3 fL (ref 80.0–100.0)
Monocytes Absolute: 0.3 K/uL (ref 0.1–1.0)
Monocytes Relative: 10 %
Neutro Abs: 2.1 K/uL (ref 1.7–7.7)
Neutrophils Relative %: 57 %
Platelet Count: 451 K/uL — ABNORMAL HIGH (ref 150–400)
RBC: 2.82 MIL/uL — ABNORMAL LOW (ref 4.22–5.81)
RDW: 20.5 % — ABNORMAL HIGH (ref 11.5–15.5)
WBC Count: 3.6 K/uL — ABNORMAL LOW (ref 4.0–10.5)
nRBC: 0 % (ref 0.0–0.2)

## 2023-12-19 LAB — IRON AND TIBC
Iron: 20 ug/dL — ABNORMAL LOW (ref 45–182)
Saturation Ratios: 5 % — ABNORMAL LOW (ref 17.9–39.5)
TIBC: 363 ug/dL (ref 250–450)
UIBC: 343 ug/dL

## 2023-12-19 LAB — BASIC METABOLIC PANEL - CANCER CENTER ONLY
Anion gap: 9 (ref 5–15)
BUN: 26 mg/dL — ABNORMAL HIGH (ref 8–23)
CO2: 21 mmol/L — ABNORMAL LOW (ref 22–32)
Calcium: 8.3 mg/dL — ABNORMAL LOW (ref 8.9–10.3)
Chloride: 109 mmol/L (ref 98–111)
Creatinine: 0.84 mg/dL (ref 0.61–1.24)
GFR, Estimated: >60 mL/min (ref >60)
Glucose, Bld: 115 mg/dL — ABNORMAL HIGH (ref 70–99)
Potassium: 4.2 mmol/L (ref 3.5–5.1)
Sodium: 139 mmol/L (ref 135–145)

## 2023-12-19 LAB — SAMPLE TO BLOOD BANK

## 2023-12-19 LAB — FERRITIN: Ferritin: 30 ng/mL (ref 24–336)

## 2023-12-19 MED ORDER — IRON SUCROSE 20 MG/ML IV SOLN
200.0000 mg | Freq: Once | INTRAVENOUS | Status: AC
  Administered 2023-12-19: 200 mg via INTRAVENOUS

## 2023-12-19 NOTE — Progress Notes (Signed)
 [facility]  Telephone:(336) [phone] Fax:([phone]) [phone]  ID: [name] [name] OB: 1955-06-27  MR#: [id number]  RDW#:[id number]  Patient Care Team: Glenard Mire, MD as PCP - General (Family Medicine) Jacobo Evalene PARAS, MD as Consulting Physician (Oncology) Twylla Glendia BROCKS, MD (Urology) Myrna Adine Anes, MD as Consulting Physician (Ophthalmology) Glenard Mire, MD as Attending Physician (Family Medicine) Tamea Dedra CROME, MD as Consulting Physician (Pulmonary Disease) Cardiovascular, [facility] [name], [name], [name]   CHIEF COMPLAINT: Recurrent, stage IV rectal adenocarcinoma.  INTERVAL HISTORY: Patient returns to clinic today for a hospital follow-up.  Patient was admitted to the hospital on November 19 due to a GI bleed.  Patient reports that ostomy bag filled up with dark red blood.  He was admitted to the hospital GI was involved colonoscopy and endoscopy did show diverticula however bleeding spontaneously stopped and they did not need any further intervention.  He was diagnosed with fungal esophagitis and is currently being treated for that full with fluconazole .  He remains asymptomatic denies any difficulty swallowing or pain.  Hemoglobin dropped as low as 7.2 in the hospital.  However bleeding spontaneously stopped and he did not need a blood transfusion while hospitalized.  Today he presents to the clinic with his wife.  He is ambulatory.  Denies any significant dizziness.  But he does endorse fatigue and shortness of breath with exertion.  He does report that symptoms are getting a little better.  Today his hemoglobin is 7.1.  He has not had any other signs or symptoms of bleeding no dark tarry stools regular bowel movements through ostomy bag.  My plan is for him to receive Venofer  today as his iron  stores are likely low due to GI bleed.  We will see him back in a week here at clinic repeat labs hold tube for possible blood transfusion or Venofer   on day 2.  Discussed with patient the importance of reporting back to the emergency room with any other signs or symptoms of GI bleed.  With any worsening shortness of breath, chest pain, blood in ostomy bag, dizziness or fainting spells.  He verbalizes understanding.  REVIEW OF SYSTEMS:   Review of Systems  Constitutional:  Negative for fever, malaise/fatigue and weight loss.  Respiratory:  Positive for shortness of breath. Negative for cough and hemoptysis.   Cardiovascular: Negative.  Negative for chest pain, palpitations and leg swelling.  Gastrointestinal: Negative.  Negative for abdominal pain, blood in stool, constipation, diarrhea and melena.  Genitourinary: Negative.  Negative for dysuria.  Musculoskeletal: Negative.  Negative for back pain.  Skin: Negative.  Negative for rash.  Neurological: Negative.  Negative for dizziness, tingling, sensory change, focal weakness, weakness and headaches.  Psychiatric/Behavioral: Negative.  The patient is not nervous/anxious and does not have insomnia.     As per HPI. Otherwise, a complete review of systems is negative.  PAST MEDICAL HISTORY: Past Medical History:  Diagnosis Date   Allergy    Anemia    Arthritis    Asthmatic bronchitis , chronic (HCC) 2025   Bronchial obstruction 07/2022   Bronchial stenosis, left 06/2022   Bronchial stenosis, left 2025   Cancer (HCC) 2015   rectal, followed by Oncology, permanent colostomy   Cancer of upper lobe of left lung (HCC) 01/2019   Colon cancer (HCC)    Colostomy in place [facility])    Dyslipidemia    Dyspnea    on exertion   Glaucoma    History of kidney stones  Hyperlipidemia    Hypertension    Mass of upper lobe of left lung 01/2019   Neuromuscular disorder (HCC)    Pneumonia    Rectal cancer (HCC) 2015   Seasonal allergies     PAST SURGICAL HISTORY: Past Surgical History:  Procedure Laterality Date   CHOLECYSTECTOMY     COLON SURGERY     has a permanment ostomy    COLONOSCOPY N/A 12/14/2023   Procedure: COLONOSCOPY;  Surgeon: San Sandor GAILS, DO;  Location: [facility] ENDOSCOPY;  Service: Gastroenterology;  Laterality: N/A;   ENDOBRONCHIAL ULTRASOUND N/A 07/01/2018   Procedure: ENDOBRONCHIAL ULTRASOUND;  Surgeon: Tamea Dedra CROME, MD;  Location: [facility] ORS;  Service: Cardiopulmonary;  Laterality: N/A;   ESOPHAGOGASTRODUODENOSCOPY N/A 12/14/2023   Procedure: EGD (ESOPHAGOGASTRODUODENOSCOPY);  Surgeon: San Sandor GAILS, DO;  Location[facility] ENDOSCOPY;  Service: Gastroenterology;  Laterality: N/A;   FLEXIBLE BRONCHOSCOPY Left 11/20/2018   Procedure: FLEXIBLE BRONCHOSCOPY;  Surgeon: Tamea Dedra CROME, MD;  Location: [facility] ORS;  Service: Cardiopulmonary;  Laterality: Left;   FLEXIBLE BRONCHOSCOPY Left 12/23/2021   Procedure: FLEXIBLE BRONCHOSCOPY;  Surgeon: Tamea Dedra CROME, MD;  Location: [facility] ORS;  Service: Pulmonary;  Laterality: Left;   FLEXIBLE BRONCHOSCOPY Left 08/09/2022   Procedure: FLEXIBLE BRONCHOSCOPY;  Surgeon: Tamea Dedra CROME, MD;  Location: [facility] ORS;  Service: Pulmonary;  Laterality: Left;   FLEXIBLE BRONCHOSCOPY Left 04/30/2023   Procedure: BRONCHOSCOPY, FLEXIBLE;  Surgeon: Tamea Dedra CROME, MD;  Location: [facility] ORS;  Service: Pulmonary;  Laterality: Left;   INTERCOSTAL NERVE BLOCK Left 02/03/2019   Procedure: INTERCOSTAL NERVE BLOCK WITH EXPAREL ;  Surgeon: Army Dallas NOVAK, MD;  Location: [facility] OR;  Service: Thoracic;  Laterality: Left;   KNEE ARTHROSCOPY Right    KNEE SURGERY Left    torn ligaments (staple in place)   laceration of wrist Right    LOBECTOMY Left 02/03/2019   Procedure: LEFT UPPER LOBECTOMY WITH LYMPH NODE DISSECTION;  Surgeon: Army Dallas NOVAK, MD;  Location: [facility] OR;  Service: Thoracic;  Laterality: Left;   LUMBAR DISC SURGERY     ruptured disc   PORT A CATH REVISION Left 08/05/2018   Procedure: PORT A CATH REVISION, REPOSITION LEFT;  Surgeon: Dessa Reyes ORN, MD;  Location: [facility] ORS;  Service: General;  Laterality:  Left;   PORT-A-CATH REMOVAL Right 12/02/2014   Procedure: REMOVAL PORT-A-CATH;  Surgeon: Lonni Brands, MD;  Location: [facility] ORS;  Service: General;  Laterality: Right;   PORTACATH PLACEMENT     PORTACATH PLACEMENT Left 07/29/2018   Procedure: INSERTION PORT-A-CATH LEFT;  Surgeon: Dessa Reyes ORN, MD;  Location: [facility] ORS;  Service: General;  Laterality: Left;   RECTAL BIOPSY  04/30/2013   RIGID BRONCHOSCOPY Left 08/09/2022   Procedure: RIGID BRONCHOSCOPY;  Surgeon: Tamea Dedra CROME, MD;  Location: [facility] ORS;  Service: Pulmonary;  Laterality: Left;   SPINE SURGERY  1989   THORACOTOMY/LOBECTOMY Left 02/03/2019   Procedure: MINITHORACOTOMY;  Surgeon: Army Dallas NOVAK, MD;  Location: [facility] OR;  Service: Thoracic;  Laterality: Left;   VIDEO ASSISTED THORACOSCOPY (VATS)/WEDGE RESECTION Left 02/03/2019   Procedure: VIDEO ASSISTED THORACOSCOPY (VATS)/LUNG RESECTION;  Surgeon: Army Dallas NOVAK, MD;  Location: [facility] OR;  Service: Thoracic;  Laterality: Left;   VIDEO BRONCHOSCOPY N/A 02/03/2019   Procedure: VIDEO BRONCHOSCOPY;  Surgeon: Army Dallas NOVAK, MD;  Location[facility] OR;  Service: Thoracic;  Laterality: N/A;   VIDEO BRONCHOSCOPY N/A 10/28/2021   Procedure: VIDEO BRONCHOSCOPY WITH CRYO and DEBULKING;  Surgeon: Tamea Dedra CROME, MD;  Location: [facility] ORS;  Service: Cardiopulmonary;  Laterality: N/A;   VIDEO BRONCHOSCOPY WITH ENDOBRONCHIAL ULTRASOUND N/A 12/30/2018   Procedure: VIDEO BRONCHOSCOPY WITH ENDOBRONCHIAL ULTRASOUND;  Surgeon: Army Dallas NOVAK, MD;  Location: [facility] OR;  Service: Thoracic;  Laterality: N/A;   XI ROBOT ABDOMINAL PERINEAL RESECTION  10/09/2013    FAMILY HISTORY: Father with prostate cancer.  COPD, CHF.     ADVANCED DIRECTIVES:    HEALTH MAINTENANCE: Social History   Tobacco Use   Smoking status: Former    Types: Cigars   Smokeless tobacco: Current    Types: Chew   Tobacco comments:    No Cigars. Does Dip. Khj 02/16/2023  Vaping Use   Vaping status:  Never Used  Substance Use Topics   Alcohol use: Yes    Comment: Occassional beer   Drug use: Never     Colonoscopy:  PAP:  Bone density:  Lipid panel:  Allergies  Allergen Reactions   Sulfa Antibiotics Hives   Gabapentin  Diarrhea   Morphine  And Codeine Other (See Comments)    Altered mental status    Current Outpatient Medications  Medication Sig Dispense Refill   acetaminophen  (TYLENOL ) 500 MG tablet Take 1,000 mg by mouth every 6 (six) hours as needed for mild pain.     Albuterol -Budesonide  (AIRSUPRA ) 90-80 MCG/ACT AERO Inhale 2 puffs into the lungs every 6 (six) hours as needed. 10.7 g 2   amLODipine  (NORVASC ) 10 MG tablet Take 1 tablet (10 mg total) by mouth daily. 90 tablet 3   calcium  carbonate (TUMS - DOSED IN MG ELEMENTAL CALCIUM ) 500 MG chewable tablet Chew 2 tablets by mouth as needed for indigestion or heartburn.     cetirizine (ZYRTEC) 10 MG tablet Take 10 mg by mouth at bedtime.      cyclobenzaprine  (FLEXERIL ) 10 MG tablet TAKE 1 TABLET BY MOUTH THREE TIMES A DAY AS NEEDED FOR MUSCLE SPASM (Patient taking differently: Take 10 mg by mouth at bedtime.) 90 tablet 3   fluconazole  (DIFLUCAN ) 100 MG tablet Take 1 tablet (100 mg total) by mouth daily. 13 tablet 0   loperamide  (IMODIUM ) 2 MG capsule Take 4 mg by mouth as needed for diarrhea or loose stools.      Multiple Vitamin (MULTIVITAMIN WITH MINERALS) TABS tablet Take 1 tablet by mouth every evening.     pantoprazole  (PROTONIX ) 40 MG tablet Take 1 tablet (40 mg total) by mouth daily before breakfast for 14 days. 14 tablet 0   tadalafil  (CIALIS ) 20 MG tablet TAKE 1 TAB BY MOUTH 1 HOUR PRIOR TO INTERCOURSE AS NEEDED 30 tablet 3   WIXELA INHUB 250-50 MCG/ACT AEPB INHALE 1 PUFF INTO THE LUNGS IN THE MORNING AND AT BEDTIME. 60 each 5   No current facility-administered medications for this visit.   Facility-Administered Medications Ordered in Other Visits  Medication Dose Route Frequency Provider Last Rate Last Admin    heparin  lock flush 100 unit/mL  500 Units Intravenous Once Finnegan, Timothy J, MD       sodium chloride  flush (NS) 0.9 % injection 10 mL  10 mL Intravenous Once Finnegan, Timothy J, MD        OBJECTIVE: Vitals:   12/19/23 0928  BP: 137/75  Pulse: 93  Resp: 16  Temp: (!) 97.1 F (36.2 C)  SpO2: 98%     Body mass index is 33.41 kg/m.    ECOG FS:0 - Asymptomatic  General: Well-developed, well-nourished, no acute distress. Eyes: Pink conjunctiva, anicteric sclera. HEENT: Normocephalic, moist mucous membranes. Lungs: No audible wheezing or coughing. Heart: Regular  rate and rhythm. Abdomen: Soft, nontender, no obvious distention. Musculoskeletal: No edema, cyanosis, or clubbing. Neuro: Alert, answering all questions appropriately. Cranial nerves grossly intact. Skin: No rashes or petechiae noted. Psych: Normal affect.  LAB RESULTS:  Lab Results  Component Value Date   NA 139 12/19/2023   K 4.2 12/19/2023   CL 109 12/19/2023   CO2 21 (L) 12/19/2023   GLUCOSE 115 (H) 12/19/2023   BUN 26 (H) 12/19/2023   CREATININE 0.84 12/19/2023   CALCIUM  8.3 (L) 12/19/2023   PROT 5.7 (L) 12/12/2023   ALBUMIN  3.4 (L) 12/12/2023   AST 18 12/12/2023   ALT 17 12/12/2023   ALKPHOS 52 12/12/2023   BILITOT 0.4 12/12/2023   GFRNONAA >60 12/19/2023   GFRAA >60 09/16/2019    Lab Results  Component Value Date   WBC 3.6 (L) 12/19/2023   NEUTROABS 2.1 12/19/2023   HGB 7.1 (L) 12/19/2023   HCT 23.2 (L) 12/19/2023   MCV 82.3 12/19/2023   PLT 451 (H) 12/19/2023   Lab Results  Component Value Date   IRON  20 (L) 12/19/2023   TIBC 363 12/19/2023   IRONPCTSAT 5 (L) 12/19/2023   Lab Results  Component Value Date   FERRITIN 30 12/19/2023     STUDIES: CT Angio Abd/Pel W and/or Wo Contrast Result Date: 12/12/2023 EXAM: CTA ABDOMEN AND PELVIS WITHOUT AND WITH CONTRAST 12/12/2023 07:19:49 AM TECHNIQUE: CTA images of the abdomen and pelvis without and with intravenous contrast (iohexol   (OMNIPAQUE ) 350 MG/ML injection 75 mL IOHEXOL  350 MG/ML SOLN). Three-dimensional MIP/volume rendered formations were performed. Automated exposure control, iterative reconstruction, and/or weight based adjustment of the mA/kV was utilized to reduce the radiation dose to as low as reasonably achievable. COMPARISON: 10/25/2023 CLINICAL HISTORY: Lower GI bleed; red blood in colostomy. FINDINGS: VASCULATURE: No definite evidence of contrast extravasation is noted to suggest gastrointestinal bleeding. AORTA: No acute finding. No abdominal aortic aneurysm. No dissection. CELIAC TRUNK: No acute finding. No occlusion or significant stenosis. SUPERIOR MESENTERIC ARTERY: No acute finding. No occlusion or significant stenosis. RENAL ARTERIES: No acute finding. No occlusion or significant stenosis. ILIAC ARTERIES: No acute finding. No occlusion or significant stenosis. LIVER: The liver is unremarkable. GALLBLADDER AND BILE DUCTS: Status post cholecystectomy. No biliary ductal dilatation. SPLEEN: The spleen is unremarkable. PANCREAS: The pancreas is unremarkable. ADRENAL GLANDS: Bilateral adrenal glands demonstrate no acute abnormality. KIDNEYS, URETERS AND BLADDER: Bilateral nonobstructive nephrolithiasis. No stones in the ureters. No hydronephrosis. No perinephric or periureteral stranding. Urinary bladder is unremarkable. GI AND BOWEL: Colostomy noted in left lower quadrant. Diverticulosis of the descending colon without inflammation. 4.5 x 4.0 cm irregular mass seen in rectal region consistent with history of rectal malignancy. Stomach and duodenal sweep demonstrate no acute abnormality. There is no bowel obstruction. No abnormal bowel wall thickening or distension. REPRODUCTIVE: Reproductive organs are unremarkable. PERITONEUM AND RETRPERITONEUM: No ascites or free air. LUNG BASE: No acute abnormality. LYMPH NODES: No lymphadenopathy. BONES AND SOFT TISSUES: No acute abnormality of the bones. No acute soft tissue  abnormality. IMPRESSION: 1. No definite evidence of active gastrointestinal bleeding on this CTA. 2. Irregular 4.5 x 4.0 cm rectal mass, compatible with known rectal malignancy. Electronically signed by: Lynwood Seip MD 12/12/2023 07:44 AM EST RP Workstation: [id number]   Narrative & Impression  EXAM: CTA ABDOMEN AND PELVIS WITHOUT AND WITH CONTRAST 12/12/2023 07:19:49 AM   TECHNIQUE: CTA images of the abdomen and pelvis without and with intravenous contrast (iohexol  (OMNIPAQUE ) 350 MG/ML injection 75 mL IOHEXOL  350 MG/ML SOLN).  Three-dimensional MIP/volume rendered formations were performed. Automated exposure control, iterative reconstruction, and/or weight based adjustment of the mA/kV was utilized to reduce the radiation dose to as low as reasonably achievable.   COMPARISON: 10/25/2023   CLINICAL HISTORY: Lower GI bleed; red blood in colostomy.   FINDINGS:   VASCULATURE: No definite evidence of contrast extravasation is noted to suggest gastrointestinal bleeding.   AORTA: No acute finding. No abdominal aortic aneurysm. No dissection.   CELIAC TRUNK: No acute finding. No occlusion or significant stenosis.   SUPERIOR MESENTERIC ARTERY: No acute finding. No occlusion or significant stenosis.   RENAL ARTERIES: No acute finding. No occlusion or significant stenosis.   ILIAC ARTERIES: No acute finding. No occlusion or significant stenosis.   LIVER: The liver is unremarkable.   GALLBLADDER AND BILE DUCTS: Status post cholecystectomy. No biliary ductal dilatation.   SPLEEN: The spleen is unremarkable.   PANCREAS: The pancreas is unremarkable.   ADRENAL GLANDS: Bilateral adrenal glands demonstrate no acute abnormality.   KIDNEYS, URETERS AND BLADDER: Bilateral nonobstructive nephrolithiasis. No stones in the ureters. No hydronephrosis. No perinephric or periureteral stranding. Urinary bladder is unremarkable.   GI AND BOWEL: Colostomy noted in left lower  quadrant. Diverticulosis of the descending colon without inflammation. 4.5 x 4.0 cm irregular mass seen in rectal region consistent with history of rectal malignancy. Stomach and duodenal sweep demonstrate no acute abnormality. There is no bowel obstruction. No abnormal bowel wall thickening or distension.   REPRODUCTIVE: Reproductive organs are unremarkable.   PERITONEUM AND RETRPERITONEUM: No ascites or free air.   LUNG BASE: No acute abnormality.   LYMPH NODES: No lymphadenopathy.   BONES AND SOFT TISSUES: No acute abnormality of the bones. No acute soft tissue abnormality.   IMPRESSION: 1. No definite evidence of active gastrointestinal bleeding on this CTA. 2. Irregular 4.5 x 4.0 cm rectal mass, compatible with known rectal malignancy    ONCOLOGY HISTORY:  Patient was initially considered a clinical stage IIIa, but after completing neoadjuvant 5-FU and XRT followed by surgical excision on October 08, 2013 he was noted to be pathologic stage Ia.  He declined adjuvant FOLFOX at that time.  More recently, patient's CEA started trending up and CT of the chest revealed a large 7.7 cm left upper lobe mass biopsy consistent with rectal adenocarcinoma.  Patient completed cycle 6 of FOLFOX plus Avastin  on October 16, 2018.  Repeat biopsy did not reveal any evidence of malignancy, but persistent fungal infection.  Patient underwent repeat bronchoscopy on December 30, 2018 which did in fact reveal residual malignancy.  He subsequently underwent surgical resection on February 03, 2019 with complete removal of residual disease.  One lymph node had a fragment of malignancy.  Patient did not require additional adjuvant chemotherapy or XRT.   ASSESSMENT: Recurrent, stage IV rectal adenocarcinoma.  PLAN:    Recurrent, stage IV rectal adenocarcinoma: See oncology history as above.  No evidence of disease.  Patient's most recent CT scan on October 25, 2023 reviewed independently with no obvious  evidence of recurrent or progressive disease.  CEA continues to be within normal limits.  No intervention is needed at this time.  Will continue evaluation and imaging every 6 months until patient is 5 years removed from his surgery at which point we will transition to yearly visits.  Continue follow-up with pulmonology as indicated.  Return to clinic in 6 months with repeat imaging and further evaluation at which point patient can be transition to yearly.   GI hemorrhage  secondary to diverticulitis: Required hospitalization.  Did not require blood transfusion.  Colonoscopy and endoscopy completed as well as CTA while hospitalized showed diverticula.  Bleeding spontaneously stopped no need for intervention.  Endoscopy showed some ungual esophagitis he is currently being treated with fluconazole .  Remains asymptomatic.  Today hemoglobin is 7.1, reports symptoms of fatigue and shortness of breath with exertion however reports improvement in symptoms.  Proceed with Venofer  today.  Return to clinic in 1 week repeat blood work hold to possible blood possible Venofer . Fungal esophagitis: Seen on endoscopy continue fluconazole .  He denies any dysphagia.  No changes in appetite.  Remains asymptomatic.  Follow up plan:  Venofer  today Return to clinic in 1 week see np/md D1 cbc with d, hold tube possible blood or venofer  D2 Follow up in April with Dr.Finnegan for surveillance as scheduled LP    Patient expressed understanding and was in agreement with this plan. He also understands that He can call clinic at any time with any questions, concerns, or complaints.    Morna Husband, NP   12/19/2023 2:21 PM

## 2023-12-19 NOTE — Progress Notes (Signed)
 12/14/23 [facility] GI bleed.

## 2023-12-24 ENCOUNTER — Ambulatory Visit: Payer: Self-pay | Admitting: Gastroenterology

## 2023-12-25 ENCOUNTER — Other Ambulatory Visit: Payer: Self-pay | Admitting: *Deleted

## 2023-12-25 DIAGNOSIS — D509 Iron deficiency anemia, unspecified: Secondary | ICD-10-CM

## 2023-12-26 ENCOUNTER — Inpatient Hospital Stay: Attending: Oncology

## 2023-12-26 ENCOUNTER — Inpatient Hospital Stay

## 2023-12-26 ENCOUNTER — Inpatient Hospital Stay: Admitting: Nurse Practitioner

## 2023-12-26 ENCOUNTER — Inpatient Hospital Stay: Attending: Oncology | Admitting: Nurse Practitioner

## 2023-12-26 ENCOUNTER — Other Ambulatory Visit: Payer: Self-pay | Admitting: *Deleted

## 2023-12-26 VITALS — BP 134/71 | HR 93 | Resp 18 | Ht 75.0 in | Wt 263.0 lb

## 2023-12-26 DIAGNOSIS — Z933 Colostomy status: Secondary | ICD-10-CM | POA: Insufficient documentation

## 2023-12-26 DIAGNOSIS — D500 Iron deficiency anemia secondary to blood loss (chronic): Secondary | ICD-10-CM | POA: Diagnosis not present

## 2023-12-26 DIAGNOSIS — D508 Other iron deficiency anemias: Secondary | ICD-10-CM | POA: Insufficient documentation

## 2023-12-26 DIAGNOSIS — Z85048 Personal history of other malignant neoplasm of rectum, rectosigmoid junction, and anus: Secondary | ICD-10-CM | POA: Diagnosis not present

## 2023-12-26 DIAGNOSIS — Z85118 Personal history of other malignant neoplasm of bronchus and lung: Secondary | ICD-10-CM | POA: Insufficient documentation

## 2023-12-26 DIAGNOSIS — H01024 Squamous blepharitis left upper eyelid: Secondary | ICD-10-CM | POA: Diagnosis not present

## 2023-12-26 DIAGNOSIS — D509 Iron deficiency anemia, unspecified: Secondary | ICD-10-CM

## 2023-12-26 DIAGNOSIS — H2513 Age-related nuclear cataract, bilateral: Secondary | ICD-10-CM | POA: Diagnosis not present

## 2023-12-26 DIAGNOSIS — H524 Presbyopia: Secondary | ICD-10-CM | POA: Diagnosis not present

## 2023-12-26 DIAGNOSIS — H01021 Squamous blepharitis right upper eyelid: Secondary | ICD-10-CM | POA: Diagnosis not present

## 2023-12-26 DIAGNOSIS — H5211 Myopia, right eye: Secondary | ICD-10-CM | POA: Diagnosis not present

## 2023-12-26 DIAGNOSIS — H40021 Open angle with borderline findings, high risk, right eye: Secondary | ICD-10-CM | POA: Diagnosis not present

## 2023-12-26 LAB — PREPARE RBC (CROSSMATCH)

## 2023-12-26 LAB — CBC WITH DIFFERENTIAL (CANCER CENTER ONLY)
Abs Immature Granulocytes: 0.04 K/uL (ref 0.00–0.07)
Basophils Absolute: 0.1 K/uL (ref 0.0–0.1)
Basophils Relative: 2 %
Eosinophils Absolute: 0.1 K/uL (ref 0.0–0.5)
Eosinophils Relative: 4 %
HCT: 24.2 % — ABNORMAL LOW (ref 39.0–52.0)
Hemoglobin: 7.3 g/dL — ABNORMAL LOW (ref 13.0–17.0)
Immature Granulocytes: 1 %
Lymphocytes Relative: 20 %
Lymphs Abs: 0.6 K/uL — ABNORMAL LOW (ref 0.7–4.0)
MCH: 25 pg — ABNORMAL LOW (ref 26.0–34.0)
MCHC: 30.2 g/dL (ref 30.0–36.0)
MCV: 82.9 fL (ref 80.0–100.0)
Monocytes Absolute: 0.4 K/uL (ref 0.1–1.0)
Monocytes Relative: 13 %
Neutro Abs: 1.9 K/uL (ref 1.7–7.7)
Neutrophils Relative %: 60 %
Platelet Count: 513 K/uL — ABNORMAL HIGH (ref 150–400)
RBC: 2.92 MIL/uL — ABNORMAL LOW (ref 4.22–5.81)
RDW: 20.5 % — ABNORMAL HIGH (ref 11.5–15.5)
WBC Count: 3.2 K/uL — ABNORMAL LOW (ref 4.0–10.5)
nRBC: 0 % (ref 0.0–0.2)

## 2023-12-26 NOTE — Progress Notes (Signed)
 [facility]  [phone] [phone]  ID: [name] [name] OB: 12/11/1955  MR#: [id number]  RDW#:[id number]  Patient Care Team: Glenard Mire, MD as PCP - General (Family Medicine) Jacobo Evalene PARAS, MD as Consulting Physician (Oncology) Twylla Glendia BROCKS, MD (Urology) Myrna Adine Anes, MD as Consulting Physician (Ophthalmology) Glenard Mire, MD as Attending Physician (Family Medicine) Tamea Dedra CROME, MD as Consulting Physician (Pulmonary Disease) Cardiovascular, [facility] [place], [place], [name]   CHIEF COMPLAINT: Recurrent, stage IV rectal adenocarcinoma.  INTERVAL HISTORY: Patient returns to clinic today for repeat blood work and possible blood/venofer .  Patient was admitted to the hospital on November 19 due to a GI bleed.  Patient reports that ostomy bag filled up with dark red blood.  He was admitted to the hospital GI was involved colonoscopy and endoscopy did show diverticula however bleeding spontaneously stopped and they did not need any further intervention.  He was diagnosed with fungal esophagitis and is currently being treated for that full with fluconazole .  He remains asymptomatic denies any difficulty swallowing or pain.  Hemoglobin dropped as low as 7.2 in the hospital.  However bleeding spontaneously stopped and he did not need a blood transfusion while hospitalized. Last week patient's repeat Hg was at 7.1 and he did receive a venofer  infusion that day.  Today he presents to the clinic alone.  He is ambulatory.  Denies any significant dizziness but continues to have fatigue and shortness of breath with exertion.  He does report that symptoms are about the same as last week.  Today his hemoglobin is 7.3.  He has not had one additional episode last week of dark blood in ostomy bag, no other blood seen and bowel movements have returned to normal per he reports.  We discussed today the need for blood transfusion tomorrow  even though hg has remained stable he is still significantly symptomatic.  We will proceed with blood transfusion tomorrow.  Repeat labs next week with possible blood or venofer .  Discussed with patient the importance of reporting back to the emergency room with any other signs or symptoms of GI bleed.  With any worsening shortness of breath, chest pain, blood in ostomy bag, dizziness or fainting spells.  He verbalizes understanding.  REVIEW OF SYSTEMS:   Review of Systems  Constitutional:  Negative for fever, malaise/fatigue and weight loss.  Respiratory:  Positive for shortness of breath. Negative for cough and hemoptysis.   Cardiovascular: Negative.  Negative for chest pain, palpitations and leg swelling.  Gastrointestinal: Negative.  Negative for abdominal pain, blood in stool, constipation, diarrhea and melena.  Genitourinary: Negative.  Negative for dysuria.  Musculoskeletal: Negative.  Negative for back pain.  Skin: Negative.  Negative for rash.  Neurological: Negative.  Negative for dizziness, tingling, sensory change, focal weakness, weakness and headaches.  Psychiatric/Behavioral: Negative.  The patient is not nervous/anxious and does not have insomnia.     As per HPI. Otherwise, a complete review of systems is negative.  PAST MEDICAL HISTORY: Past Medical History:  Diagnosis Date   Allergy    Anemia    Arthritis    Asthmatic bronchitis , chronic (HCC) 2025   Bronchial obstruction 07/2022   Bronchial stenosis, left 06/2022   Bronchial stenosis, left 2025   Cancer (HCC) 2015   rectal, followed by Oncology, permanent colostomy   Cancer of upper lobe of left lung (HCC) 01/2019   Colon cancer (HCC)    Colostomy in place [facility])  Dyslipidemia    Dyspnea    on exertion   Glaucoma    History of kidney stones    Hyperlipidemia    Hypertension    Mass of upper lobe of left lung 01/2019   Neuromuscular disorder (HCC)    Pneumonia    Rectal cancer (HCC) 2015   Seasonal  allergies     PAST SURGICAL HISTORY: Past Surgical History:  Procedure Laterality Date   CHOLECYSTECTOMY     COLON SURGERY     has a permanment ostomy   COLONOSCOPY N/A 12/14/2023   Procedure: COLONOSCOPY;  Surgeon: San Sandor GAILS, DO;  Location: [facility] ENDOSCOPY;  Service: Gastroenterology;  Laterality: N/A;   ENDOBRONCHIAL ULTRASOUND N/A 07/01/2018   Procedure: ENDOBRONCHIAL ULTRASOUND;  Surgeon: Tamea Dedra CROME, MD;  Location: [facility] ORS;  Service: Cardiopulmonary;  Laterality: N/A;   ESOPHAGOGASTRODUODENOSCOPY N/A 12/14/2023   Procedure: EGD (ESOPHAGOGASTRODUODENOSCOPY);  Surgeon: San Sandor GAILS, DO;  Location: [facility] ENDOSCOPY;  Service: Gastroenterology;  Laterality: N/A;   FLEXIBLE BRONCHOSCOPY Left 11/20/2018   Procedure: FLEXIBLE BRONCHOSCOPY;  Surgeon: Tamea Dedra CROME, MD;  Location: [facility] ORS;  Service: Cardiopulmonary;  Laterality: Left;   FLEXIBLE BRONCHOSCOPY Left 12/23/2021   Procedure: FLEXIBLE BRONCHOSCOPY;  Surgeon: Tamea Dedra CROME, MD;  Location: [facility] ORS;  Service: Pulmonary;  Laterality: Left;   FLEXIBLE BRONCHOSCOPY Left 08/09/2022   Procedure: FLEXIBLE BRONCHOSCOPY;  Surgeon: Tamea Dedra CROME, MD;  Location: [facility] ORS;  Service: Pulmonary;  Laterality: Left;   FLEXIBLE BRONCHOSCOPY Left 04/30/2023   Procedure: BRONCHOSCOPY, FLEXIBLE;  Surgeon: Tamea Dedra CROME, MD;  Location: [facility] ORS;  Service: Pulmonary;  Laterality: Left;   INTERCOSTAL NERVE BLOCK Left 02/03/2019   Procedure: INTERCOSTAL NERVE BLOCK WITH EXPAREL ;  Surgeon: Army Dallas NOVAK, MD;  Location: [place] OR;  Service: Thoracic;  Laterality: Left;   KNEE ARTHROSCOPY Right    KNEE SURGERY Left    torn ligaments (staple in place)   laceration of wrist Right    LOBECTOMY Left 02/03/2019   Procedure: LEFT UPPER LOBECTOMY WITH LYMPH NODE DISSECTION;  Surgeon: Army Dallas NOVAK, MD;  Location: [facility] OR;  Service: Thoracic;  Laterality: Left;   LUMBAR DISC SURGERY     ruptured disc   PORT A CATH  REVISION Left 08/05/2018   Procedure: PORT A CATH REVISION, REPOSITION LEFT;  Surgeon: Dessa Reyes ORN, MD;  Location: [facility] ORS;  Service: General;  Laterality: Left;   PORT-A-CATH REMOVAL Right 12/02/2014   Procedure: REMOVAL PORT-A-CATH;  Surgeon: Lonni Brands, MD;  Location: [facility] ORS;  Service: General;  Laterality: Right;   PORTACATH PLACEMENT     PORTACATH PLACEMENT Left 07/29/2018   Procedure: INSERTION PORT-A-CATH LEFT;  Surgeon: Dessa Reyes ORN, MD;  Location: [facility] ORS;  Service: General;  Laterality: Left;   RECTAL BIOPSY  04/30/2013   RIGID BRONCHOSCOPY Left 08/09/2022   Procedure: RIGID BRONCHOSCOPY;  Surgeon: Tamea Dedra CROME, MD;  Location: [facility] ORS;  Service: Pulmonary;  Laterality: Left;   SPINE SURGERY  1989   THORACOTOMY/LOBECTOMY Left 02/03/2019   Procedure: MINITHORACOTOMY;  Surgeon: Army Dallas NOVAK, MD;  Location: [facility] OR;  Service: Thoracic;  Laterality: Left;   VIDEO ASSISTED THORACOSCOPY (VATS)/WEDGE RESECTION Left 02/03/2019   Procedure: VIDEO ASSISTED THORACOSCOPY (VATS)/LUNG RESECTION;  Surgeon: Army Dallas NOVAK, MD;  Location: [facility] OR;  Service: Thoracic;  Laterality: Left;   VIDEO BRONCHOSCOPY N/A 02/03/2019   Procedure: VIDEO BRONCHOSCOPY;  Surgeon: Army Dallas NOVAK, MD;  Location: [facility] OR;  Service: Thoracic;  Laterality: N/A;   VIDEO BRONCHOSCOPY N/A 10/28/2021  Procedure: VIDEO BRONCHOSCOPY WITH CRYO and DEBULKING;  Surgeon: Tamea Dedra CROME, MD;  Location: [facility] ORS;  Service: Cardiopulmonary;  Laterality: N/A;   VIDEO BRONCHOSCOPY WITH ENDOBRONCHIAL ULTRASOUND N/A 12/30/2018   Procedure: VIDEO BRONCHOSCOPY WITH ENDOBRONCHIAL ULTRASOUND;  Surgeon: Army Dallas NOVAK, MD;  Location: [facility] OR;  Service: Thoracic;  Laterality: N/A;   XI ROBOT ABDOMINAL PERINEAL RESECTION  10/09/2013    FAMILY HISTORY: Father with prostate cancer.  COPD, CHF.     ADVANCED DIRECTIVES:    HEALTH MAINTENANCE: Social History   Tobacco Use   Smoking status:  Former    Types: Cigars   Smokeless tobacco: Current    Types: Chew   Tobacco comments:    No Cigars. Does Dip. Khj 02/16/2023  Vaping Use   Vaping status: Never Used  Substance Use Topics   Alcohol use: Yes    Comment: Occassional beer   Drug use: Never     Colonoscopy:  PAP:  Bone density:  Lipid panel:  Allergies  Allergen Reactions   Sulfa Antibiotics Hives   Gabapentin  Diarrhea   Morphine  And Codeine Other (See Comments)    Altered mental status    Current Outpatient Medications  Medication Sig Dispense Refill   acetaminophen  (TYLENOL ) 500 MG tablet Take 1,000 mg by mouth every 6 (six) hours as needed for mild pain.     Albuterol -Budesonide  (AIRSUPRA ) 90-80 MCG/ACT AERO Inhale 2 puffs into the lungs every 6 (six) hours as needed. 10.7 g 2   amLODipine  (NORVASC ) 10 MG tablet Take 1 tablet (10 mg total) by mouth daily. 90 tablet 3   calcium  carbonate (TUMS - DOSED IN MG ELEMENTAL CALCIUM ) 500 MG chewable tablet Chew 2 tablets by mouth as needed for indigestion or heartburn.     cetirizine (ZYRTEC) 10 MG tablet Take 10 mg by mouth at bedtime.      cyclobenzaprine  (FLEXERIL ) 10 MG tablet TAKE 1 TABLET BY MOUTH THREE TIMES A DAY AS NEEDED FOR MUSCLE SPASM (Patient taking differently: Take 10 mg by mouth at bedtime.) 90 tablet 3   fluconazole  (DIFLUCAN ) 100 MG tablet Take 1 tablet (100 mg total) by mouth daily. 13 tablet 0   loperamide  (IMODIUM ) 2 MG capsule Take 4 mg by mouth as needed for diarrhea or loose stools.      Multiple Vitamin (MULTIVITAMIN WITH MINERALS) TABS tablet Take 1 tablet by mouth every evening.     pantoprazole  (PROTONIX ) 40 MG tablet Take 1 tablet (40 mg total) by mouth daily before breakfast for 14 days. 14 tablet 0   tadalafil  (CIALIS ) 20 MG tablet TAKE 1 TAB BY MOUTH 1 HOUR PRIOR TO INTERCOURSE AS NEEDED 30 tablet 3   WIXELA INHUB 250-50 MCG/ACT AEPB INHALE 1 PUFF INTO THE LUNGS IN THE MORNING AND AT BEDTIME. 60 each 5   No current  facility-administered medications for this visit.   Facility-Administered Medications Ordered in Other Visits  Medication Dose Route Frequency Provider Last Rate Last Admin   heparin  lock flush 100 unit/mL  500 Units Intravenous Once Finnegan, Timothy J, MD       sodium chloride  flush (NS) 0.9 % injection 10 mL  10 mL Intravenous Once Finnegan, Timothy J, MD        OBJECTIVE: Vitals:   12/26/23 0947  BP: 134/71  Pulse: 93  Resp: 18  SpO2: 100%     Body mass index is 32.87 kg/m.    ECOG FS:0 - Asymptomatic  General: Well-developed, well-nourished, no acute distress. Eyes: Pink conjunctiva, anicteric  sclera. HEENT: Normocephalic, moist mucous membranes. Lungs: No audible wheezing or coughing. Heart: Regular rate and rhythm. Abdomen: Soft, nontender, no obvious distention. Musculoskeletal: No edema, cyanosis, or clubbing. Neuro: Alert, answering all questions appropriately. Cranial nerves grossly intact. Skin: No rashes or petechiae noted. Psych: Normal affect.  LAB RESULTS:  Lab Results  Component Value Date   NA 139 12/19/2023   K 4.2 12/19/2023   CL 109 12/19/2023   CO2 21 (L) 12/19/2023   GLUCOSE 115 (H) 12/19/2023   BUN 26 (H) 12/19/2023   CREATININE 0.84 12/19/2023   CALCIUM  8.3 (L) 12/19/2023   PROT 5.7 (L) 12/12/2023   ALBUMIN  3.4 (L) 12/12/2023   AST 18 12/12/2023   ALT 17 12/12/2023   ALKPHOS 52 12/12/2023   BILITOT 0.4 12/12/2023   GFRNONAA >60 12/19/2023   GFRAA >60 09/16/2019    Lab Results  Component Value Date   WBC 3.2 (L) 12/26/2023   NEUTROABS 1.9 12/26/2023   HGB 7.3 (L) 12/26/2023   HCT 24.2 (L) 12/26/2023   MCV 82.9 12/26/2023   PLT 513 (H) 12/26/2023   Lab Results  Component Value Date   IRON  20 (L) 12/19/2023   TIBC 363 12/19/2023   IRONPCTSAT 5 (L) 12/19/2023   Lab Results  Component Value Date   FERRITIN 30 12/19/2023     STUDIES: CT Angio Abd/Pel W and/or Wo Contrast Result Date: 12/12/2023 EXAM: CTA ABDOMEN AND PELVIS  WITHOUT AND WITH CONTRAST 12/12/2023 07:19:49 AM TECHNIQUE: CTA images of the abdomen and pelvis without and with intravenous contrast (iohexol  (OMNIPAQUE ) 350 MG/ML injection 75 mL IOHEXOL  350 MG/ML SOLN). Three-dimensional MIP/volume rendered formations were performed. Automated exposure control, iterative reconstruction, and/or weight based adjustment of the mA/kV was utilized to reduce the radiation dose to as low as reasonably achievable. COMPARISON: 10/25/2023 CLINICAL HISTORY: Lower GI bleed; red blood in colostomy. FINDINGS: VASCULATURE: No definite evidence of contrast extravasation is noted to suggest gastrointestinal bleeding. AORTA: No acute finding. No abdominal aortic aneurysm. No dissection. CELIAC TRUNK: No acute finding. No occlusion or significant stenosis. SUPERIOR MESENTERIC ARTERY: No acute finding. No occlusion or significant stenosis. RENAL ARTERIES: No acute finding. No occlusion or significant stenosis. ILIAC ARTERIES: No acute finding. No occlusion or significant stenosis. LIVER: The liver is unremarkable. GALLBLADDER AND BILE DUCTS: Status post cholecystectomy. No biliary ductal dilatation. SPLEEN: The spleen is unremarkable. PANCREAS: The pancreas is unremarkable. ADRENAL GLANDS: Bilateral adrenal glands demonstrate no acute abnormality. KIDNEYS, URETERS AND BLADDER: Bilateral nonobstructive nephrolithiasis. No stones in the ureters. No hydronephrosis. No perinephric or periureteral stranding. Urinary bladder is unremarkable. GI AND BOWEL: Colostomy noted in left lower quadrant. Diverticulosis of the descending colon without inflammation. 4.5 x 4.0 cm irregular mass seen in rectal region consistent with history of rectal malignancy. Stomach and duodenal sweep demonstrate no acute abnormality. There is no bowel obstruction. No abnormal bowel wall thickening or distension. REPRODUCTIVE: Reproductive organs are unremarkable. PERITONEUM AND RETRPERITONEUM: No ascites or free air. LUNG BASE:  No acute abnormality. LYMPH NODES: No lymphadenopathy. BONES AND SOFT TISSUES: No acute abnormality of the bones. No acute soft tissue abnormality. IMPRESSION: 1. No definite evidence of active gastrointestinal bleeding on this CTA. 2. Irregular 4.5 x 4.0 cm rectal mass, compatible with known rectal malignancy. Electronically signed by: Lynwood Seip MD 12/12/2023 07:44 AM EST RP Workstation: [id number]   Narrative & Impression  EXAM: CTA ABDOMEN AND PELVIS WITHOUT AND WITH CONTRAST 12/12/2023 07:19:49 AM   TECHNIQUE: CTA images of the abdomen and pelvis without and  with intravenous contrast (iohexol  (OMNIPAQUE ) 350 MG/ML injection 75 mL IOHEXOL  350 MG/ML SOLN). Three-dimensional MIP/volume rendered formations were performed. Automated exposure control, iterative reconstruction, and/or weight based adjustment of the mA/kV was utilized to reduce the radiation dose to as low as reasonably achievable.   COMPARISON: 10/25/2023   CLINICAL HISTORY: Lower GI bleed; red blood in colostomy.   FINDINGS:   VASCULATURE: No definite evidence of contrast extravasation is noted to suggest gastrointestinal bleeding.   AORTA: No acute finding. No abdominal aortic aneurysm. No dissection.   CELIAC TRUNK: No acute finding. No occlusion or significant stenosis.   SUPERIOR MESENTERIC ARTERY: No acute finding. No occlusion or significant stenosis.   RENAL ARTERIES: No acute finding. No occlusion or significant stenosis.   ILIAC ARTERIES: No acute finding. No occlusion or significant stenosis.   LIVER: The liver is unremarkable.   GALLBLADDER AND BILE DUCTS: Status post cholecystectomy. No biliary ductal dilatation.   SPLEEN: The spleen is unremarkable.   PANCREAS: The pancreas is unremarkable.   ADRENAL GLANDS: Bilateral adrenal glands demonstrate no acute abnormality.   KIDNEYS, URETERS AND BLADDER: Bilateral nonobstructive nephrolithiasis. No stones in the ureters.  No hydronephrosis. No perinephric or periureteral stranding. Urinary bladder is unremarkable.   GI AND BOWEL: Colostomy noted in left lower quadrant. Diverticulosis of the descending colon without inflammation. 4.5 x 4.0 cm irregular mass seen in rectal region consistent with history of rectal malignancy. Stomach and duodenal sweep demonstrate no acute abnormality. There is no bowel obstruction. No abnormal bowel wall thickening or distension.   REPRODUCTIVE: Reproductive organs are unremarkable.   PERITONEUM AND RETRPERITONEUM: No ascites or free air.   LUNG BASE: No acute abnormality.   LYMPH NODES: No lymphadenopathy.   BONES AND SOFT TISSUES: No acute abnormality of the bones. No acute soft tissue abnormality.   IMPRESSION: 1. No definite evidence of active gastrointestinal bleeding on this CTA. 2. Irregular 4.5 x 4.0 cm rectal mass, compatible with known rectal malignancy    ONCOLOGY HISTORY:  Patient was initially considered a clinical stage IIIa, but after completing neoadjuvant 5-FU and XRT followed by surgical excision on October 08, 2013 he was noted to be pathologic stage Ia.  He declined adjuvant FOLFOX at that time.  More recently, patient's CEA started trending up and CT of the chest revealed a large 7.7 cm left upper lobe mass biopsy consistent with rectal adenocarcinoma.  Patient completed cycle 6 of FOLFOX plus Avastin  on October 16, 2018.  Repeat biopsy did not reveal any evidence of malignancy, but persistent fungal infection.  Patient underwent repeat bronchoscopy on December 30, 2018 which did in fact reveal residual malignancy.  He subsequently underwent surgical resection on February 03, 2019 with complete removal of residual disease.  One lymph node had a fragment of malignancy.  Patient did not require additional adjuvant chemotherapy or XRT.   ASSESSMENT: Recurrent, stage IV rectal adenocarcinoma.  PLAN:    Recurrent, stage IV rectal adenocarcinoma:  See oncology history as above.  No evidence of disease.  Patient's most recent CT scan on October 25, 2023 reviewed independently with no obvious evidence of recurrent or progressive disease.  CEA continues to be within normal limits.  No intervention is needed at this time.  Will continue evaluation and imaging every 6 months until patient is 5 years removed from his surgery at which point we will transition to yearly visits.  Continue follow-up with pulmonology as indicated.  Return to clinic in 6 months with repeat imaging and further  evaluation at which point patient can be transition to yearly.  No changes to surveillance poc. GI hemorrhage secondary to diverticulitis: Required hospitalization.  Did not require blood transfusion.  Colonoscopy and endoscopy completed as well as CTA while hospitalized showed diverticula.  Bleeding spontaneously stopped no need for intervention.  Endoscopy showed some ungual esophagitis he is currently being treated with fluconazole .  Remains asymptomatic.  Today repeat hemoglobin is 7.3 post venofer  last week, reports symptoms of fatigue and shortness of breath with exertion symptoms not improving  Proceed with blood tomorrow.  Return to clinic in 1 week repeat blood work hold to possible blood possible Venofer . Fungal esophagitis: Seen on endoscopy continue fluconazole .  He denies any dysphagia.  No changes in appetite.  Remains asymptomatic. Appears resolved   Follow up plan:   Blood tomorrow F/U in 1 week on Monday or Wednesday for CBC with diff, hold tube and venofer  D1 possible blood D2 LP    Patient expressed understanding and was in agreement with this plan. He also understands that He can call clinic at any time with any questions, concerns, or complaints.    Morna Husband, NP   12/26/2023 8:52 PM

## 2023-12-26 NOTE — Progress Notes (Signed)
 Patient reports SOB and lightheadedness he still has neuropathy but it is the same

## 2023-12-27 ENCOUNTER — Inpatient Hospital Stay

## 2023-12-27 DIAGNOSIS — D500 Iron deficiency anemia secondary to blood loss (chronic): Secondary | ICD-10-CM

## 2023-12-27 DIAGNOSIS — D508 Other iron deficiency anemias: Secondary | ICD-10-CM | POA: Diagnosis not present

## 2023-12-27 LAB — PREPARE RBC (CROSSMATCH)

## 2023-12-27 MED ORDER — DIPHENHYDRAMINE HCL 25 MG PO TABS
25.0000 mg | ORAL_TABLET | Freq: Once | ORAL | Status: AC
  Administered 2023-12-27: 25 mg via ORAL

## 2023-12-27 MED ORDER — ACETAMINOPHEN 325 MG PO TABS: 650.0000 mg | ORAL_TABLET | Freq: Once | ORAL | Status: DC

## 2023-12-27 MED ORDER — SODIUM CHLORIDE 0.9% IV SOLUTION
250.0000 mL | INTRAVENOUS | Status: DC
  Administered 2023-12-27: 100 mL via INTRAVENOUS
  Filled 2023-12-27: qty 250

## 2023-12-28 ENCOUNTER — Ambulatory Visit: Admitting: Pulmonary Disease

## 2023-12-28 LAB — TYPE AND SCREEN
ABO/RH(D): O NEG
Antibody Screen: NEGATIVE
Unit division: 0

## 2023-12-28 LAB — BPAM RBC
Blood Product Expiration Date: 202601042359
ISSUE DATE / TIME: 202512040836
Unit Type and Rh: 202601042359
Unit Type and Rh: 5100

## 2024-01-02 ENCOUNTER — Inpatient Hospital Stay

## 2024-01-02 ENCOUNTER — Encounter: Payer: Self-pay | Admitting: *Deleted

## 2024-01-02 ENCOUNTER — Other Ambulatory Visit: Payer: Self-pay | Admitting: *Deleted

## 2024-01-02 ENCOUNTER — Encounter: Payer: Self-pay | Admitting: Oncology

## 2024-01-02 DIAGNOSIS — D508 Other iron deficiency anemias: Secondary | ICD-10-CM | POA: Diagnosis not present

## 2024-01-02 DIAGNOSIS — D509 Iron deficiency anemia, unspecified: Secondary | ICD-10-CM

## 2024-01-02 DIAGNOSIS — D500 Iron deficiency anemia secondary to blood loss (chronic): Secondary | ICD-10-CM

## 2024-01-02 LAB — CBC WITH DIFFERENTIAL (CANCER CENTER ONLY)
Abs Immature Granulocytes: 0.01 K/uL (ref 0.00–0.07)
Basophils Absolute: 0.1 K/uL (ref 0.0–0.1)
Basophils Relative: 2 %
Eosinophils Absolute: 0.2 K/uL (ref 0.0–0.5)
Eosinophils Relative: 5 %
HCT: 27.8 % — ABNORMAL LOW (ref 39.0–52.0)
Hemoglobin: 8.5 g/dL — ABNORMAL LOW (ref 13.0–17.0)
Immature Granulocytes: 0 %
Lymphocytes Relative: 25 %
Lymphs Abs: 0.9 K/uL (ref 0.7–4.0)
MCH: 24.9 pg — ABNORMAL LOW (ref 26.0–34.0)
MCHC: 30.6 g/dL (ref 30.0–36.0)
MCV: 81.5 fL (ref 80.0–100.0)
Monocytes Absolute: 0.5 K/uL (ref 0.1–1.0)
Monocytes Relative: 13 %
Neutro Abs: 2 K/uL (ref 1.7–7.7)
Neutrophils Relative %: 55 %
Platelet Count: 520 K/uL — ABNORMAL HIGH (ref 150–400)
RBC: 3.41 MIL/uL — ABNORMAL LOW (ref 4.22–5.81)
RDW: 19.2 % — ABNORMAL HIGH (ref 11.5–15.5)
WBC Count: 3.6 K/uL — ABNORMAL LOW (ref 4.0–10.5)
nRBC: 0 % (ref 0.0–0.2)

## 2024-01-02 LAB — SAMPLE TO BLOOD BANK

## 2024-01-02 MED ORDER — IRON SUCROSE 20 MG/ML IV SOLN
200.0000 mg | Freq: Once | INTRAVENOUS | Status: AC
  Administered 2024-01-02: 200 mg via INTRAVENOUS

## 2024-01-02 NOTE — Patient Instructions (Signed)

## 2024-01-03 ENCOUNTER — Inpatient Hospital Stay

## 2024-01-08 ENCOUNTER — Ambulatory Visit: Admitting: Pulmonary Disease

## 2024-01-10 ENCOUNTER — Inpatient Hospital Stay

## 2024-01-10 ENCOUNTER — Encounter: Payer: Self-pay | Admitting: *Deleted

## 2024-01-10 VITALS — BP 157/85 | HR 79 | Temp 97.3°F | Resp 18

## 2024-01-10 DIAGNOSIS — D508 Other iron deficiency anemias: Secondary | ICD-10-CM | POA: Diagnosis not present

## 2024-01-10 DIAGNOSIS — D509 Iron deficiency anemia, unspecified: Secondary | ICD-10-CM

## 2024-01-10 DIAGNOSIS — D500 Iron deficiency anemia secondary to blood loss (chronic): Secondary | ICD-10-CM

## 2024-01-10 LAB — CBC WITH DIFFERENTIAL/PLATELET
Abs Immature Granulocytes: 0.01 K/uL (ref 0.00–0.07)
Basophils Absolute: 0.1 K/uL (ref 0.0–0.1)
Basophils Relative: 2 %
Eosinophils Absolute: 0.2 K/uL (ref 0.0–0.5)
Eosinophils Relative: 7 %
HCT: 29.2 % — ABNORMAL LOW (ref 39.0–52.0)
Hemoglobin: 8.8 g/dL — ABNORMAL LOW (ref 13.0–17.0)
Immature Granulocytes: 0 %
Lymphocytes Relative: 24 %
Lymphs Abs: 0.8 K/uL (ref 0.7–4.0)
MCH: 24.5 pg — ABNORMAL LOW (ref 26.0–34.0)
MCHC: 30.1 g/dL (ref 30.0–36.0)
MCV: 81.3 fL (ref 80.0–100.0)
Monocytes Absolute: 0.4 K/uL (ref 0.1–1.0)
Monocytes Relative: 11 %
Neutro Abs: 1.8 K/uL (ref 1.7–7.7)
Neutrophils Relative %: 56 %
Platelets: 436 K/uL — ABNORMAL HIGH (ref 150–400)
RBC: 3.59 MIL/uL — ABNORMAL LOW (ref 4.22–5.81)
RDW: 19.8 % — ABNORMAL HIGH (ref 11.5–15.5)
WBC: 3.3 K/uL — ABNORMAL LOW (ref 4.0–10.5)
nRBC: 0 % (ref 0.0–0.2)

## 2024-01-10 LAB — SAMPLE TO BLOOD BANK

## 2024-01-10 MED ORDER — IRON SUCROSE 20 MG/ML IV SOLN
200.0000 mg | Freq: Once | INTRAVENOUS | Status: AC
  Administered 2024-01-10: 08:00:00 200 mg via INTRAVENOUS

## 2024-01-10 NOTE — Patient Instructions (Signed)

## 2024-01-11 ENCOUNTER — Inpatient Hospital Stay

## 2024-01-21 ENCOUNTER — Inpatient Hospital Stay

## 2024-01-28 ENCOUNTER — Inpatient Hospital Stay

## 2024-01-30 ENCOUNTER — Inpatient Hospital Stay

## 2024-01-30 ENCOUNTER — Inpatient Hospital Stay: Attending: Oncology

## 2024-01-30 VITALS — BP 137/84 | HR 76 | Temp 97.0°F | Resp 16

## 2024-01-30 DIAGNOSIS — K922 Gastrointestinal hemorrhage, unspecified: Secondary | ICD-10-CM | POA: Insufficient documentation

## 2024-01-30 DIAGNOSIS — D509 Iron deficiency anemia, unspecified: Secondary | ICD-10-CM

## 2024-01-30 DIAGNOSIS — D500 Iron deficiency anemia secondary to blood loss (chronic): Secondary | ICD-10-CM | POA: Insufficient documentation

## 2024-01-30 MED ORDER — IRON SUCROSE 20 MG/ML IV SOLN
200.0000 mg | Freq: Once | INTRAVENOUS | Status: AC
  Administered 2024-01-30: 200 mg via INTRAVENOUS
  Filled 2024-01-30: qty 10

## 2024-01-30 NOTE — Patient Instructions (Signed)

## 2024-02-04 ENCOUNTER — Inpatient Hospital Stay

## 2024-02-04 VITALS — BP 154/89 | HR 87 | Temp 97.6°F | Resp 16

## 2024-02-04 DIAGNOSIS — D500 Iron deficiency anemia secondary to blood loss (chronic): Secondary | ICD-10-CM | POA: Diagnosis not present

## 2024-02-04 DIAGNOSIS — D509 Iron deficiency anemia, unspecified: Secondary | ICD-10-CM

## 2024-02-04 MED ORDER — IRON SUCROSE 20 MG/ML IV SOLN
200.0000 mg | Freq: Once | INTRAVENOUS | Status: AC
  Administered 2024-02-04: 200 mg via INTRAVENOUS
  Filled 2024-02-04: qty 10

## 2024-02-04 NOTE — Patient Instructions (Signed)

## 2024-02-07 ENCOUNTER — Encounter: Payer: Self-pay | Admitting: Oncology

## 2024-02-12 ENCOUNTER — Encounter: Payer: Self-pay | Admitting: Oncology

## 2024-02-13 ENCOUNTER — Inpatient Hospital Stay

## 2024-02-13 ENCOUNTER — Encounter: Payer: Self-pay | Admitting: Nurse Practitioner

## 2024-02-13 ENCOUNTER — Other Ambulatory Visit: Payer: Self-pay

## 2024-02-13 ENCOUNTER — Inpatient Hospital Stay (HOSPITAL_BASED_OUTPATIENT_CLINIC_OR_DEPARTMENT_OTHER): Admitting: Nurse Practitioner

## 2024-02-13 ENCOUNTER — Inpatient Hospital Stay: Admitting: Nurse Practitioner

## 2024-02-13 VITALS — BP 144/81 | HR 87 | Temp 97.1°F | Resp 16 | Wt 272.4 lb

## 2024-02-13 VITALS — BP 150/75 | HR 79

## 2024-02-13 DIAGNOSIS — D509 Iron deficiency anemia, unspecified: Secondary | ICD-10-CM

## 2024-02-13 DIAGNOSIS — D500 Iron deficiency anemia secondary to blood loss (chronic): Secondary | ICD-10-CM

## 2024-02-13 DIAGNOSIS — C20 Malignant neoplasm of rectum: Secondary | ICD-10-CM

## 2024-02-13 LAB — CBC WITH DIFFERENTIAL/PLATELET
Abs Immature Granulocytes: 0.02 K/uL (ref 0.00–0.07)
Basophils Absolute: 0.1 K/uL (ref 0.0–0.1)
Basophils Relative: 2 %
Eosinophils Absolute: 0.3 K/uL (ref 0.0–0.5)
Eosinophils Relative: 7 %
HCT: 32.5 % — ABNORMAL LOW (ref 39.0–52.0)
Hemoglobin: 9.7 g/dL — ABNORMAL LOW (ref 13.0–17.0)
Immature Granulocytes: 1 %
Lymphocytes Relative: 24 %
Lymphs Abs: 0.8 K/uL (ref 0.7–4.0)
MCH: 24.1 pg — ABNORMAL LOW (ref 26.0–34.0)
MCHC: 29.8 g/dL — ABNORMAL LOW (ref 30.0–36.0)
MCV: 80.8 fL (ref 80.0–100.0)
Monocytes Absolute: 0.3 K/uL (ref 0.1–1.0)
Monocytes Relative: 9 %
Neutro Abs: 2 K/uL (ref 1.7–7.7)
Neutrophils Relative %: 57 %
Platelets: 476 K/uL — ABNORMAL HIGH (ref 150–400)
RBC: 4.02 MIL/uL — ABNORMAL LOW (ref 4.22–5.81)
RDW: 21.1 % — ABNORMAL HIGH (ref 11.5–15.5)
WBC: 3.4 K/uL — ABNORMAL LOW (ref 4.0–10.5)
nRBC: 0 % (ref 0.0–0.2)

## 2024-02-13 LAB — IRON AND TIBC
Iron: 29 ug/dL — ABNORMAL LOW (ref 45–182)
Saturation Ratios: 8 % — ABNORMAL LOW (ref 17.9–39.5)
TIBC: 372 ug/dL (ref 250–450)
UIBC: 344 ug/dL

## 2024-02-13 LAB — SAMPLE TO BLOOD BANK

## 2024-02-13 LAB — FERRITIN: Ferritin: 60 ng/mL (ref 24–336)

## 2024-02-13 MED ORDER — SODIUM CHLORIDE 0.9% FLUSH
10.0000 mL | Freq: Once | INTRAVENOUS | Status: AC | PRN
  Administered 2024-02-13: 10 mL
  Filled 2024-02-13: qty 10

## 2024-02-13 MED ORDER — IRON SUCROSE 20 MG/ML IV SOLN
200.0000 mg | Freq: Once | INTRAVENOUS | Status: AC
  Administered 2024-02-13: 200 mg via INTRAVENOUS

## 2024-02-13 NOTE — Progress Notes (Unsigned)
 " [facility]  Telephone:([phone]) [phone] Fax:(336) [phone]  ID: [name] [name] OB: 09-Oct-1955  MR#: [id number]  RDW#:[id number]  Patient Care Team: Glenard Mire, MD as PCP - General (Family Medicine) Jacobo Evalene PARAS, MD as Consulting Physician (Oncology) Twylla Glendia BROCKS, MD (Urology) Myrna Adine Anes, MD as Consulting Physician (Ophthalmology) Glenard Mire, MD as Attending Physician (Family Medicine) Tamea Dedra CROME, MD as Consulting Physician (Pulmonary Disease) Cardiovascular, [facility] [place], [place], [name]   CHIEF COMPLAINT: Recurrent, stage IV rectal adenocarcinoma.  INTERVAL HISTORY: [name] [name] is a 69 year old male with iron  deficiency anemia secondary to gastrointestinal blood loss due to diverticula in Nov.  Bleeding spontaneously stopped.  He was also treated for fungal esophagitis at that time as well.  He is currently on surveillance for rectal adenocarcinoma every 6 months.  However after GI bleed we have been following him more frequently to manage his anemia as well.  Has required IV iron  as well as 1 unit pRBC post hospitalization.   He has not experienced further episodes of gastrointestinal bleeding since his last visit, with no hematochezia, melena, or visible blood in stools. Hemoglobin has increased to 9.7 g/dL, up by 0.8 g/dL from the previous measurement. Energy levels have improved, and he continues to receive intravenous iron  infusions, with the most recent administered last week and another scheduled for today. He previously received a blood transfusion, which was beneficial.  His last dose of IV iron  was last week and he has been tolerating well.  Hemoglobin slowly improving.  He denies peripheral edema, dizziness, or dyspnea. Appetite remains robust. He reports significant improvement in breathing and overall well-being.  He has not received follow-up from gastroenterology regarding the prior gastrointestinal bleed,  which occurred before the holidays. Surveillance imaging is scheduled for April.   REVIEW OF SYSTEMS:   Review of Systems  Constitutional:  Negative for fever, malaise/fatigue and weight loss.  Respiratory:  Positive for shortness of breath. Negative for cough and hemoptysis.   Cardiovascular: Negative.  Negative for chest pain, palpitations and leg swelling.  Gastrointestinal: Negative.  Negative for abdominal pain, blood in stool, constipation, diarrhea and melena.  Genitourinary: Negative.  Negative for dysuria.  Musculoskeletal: Negative.  Negative for back pain.  Skin: Negative.  Negative for rash.  Neurological: Negative.  Negative for dizziness, tingling, sensory change, focal weakness, weakness and headaches.  Psychiatric/Behavioral: Negative.  The patient is not nervous/anxious and does not have insomnia.     As per HPI. Otherwise, a complete review of systems is negative.  PAST MEDICAL HISTORY: Past Medical History:  Diagnosis Date   Allergy    Anemia    Arthritis    Asthmatic bronchitis , chronic (HCC) 2025   Bronchial obstruction 07/2022   Bronchial stenosis, left 06/2022   Bronchial stenosis, left 2025   Cancer (HCC) 2015   rectal, followed by Oncology, permanent colostomy   Cancer of upper lobe of left lung (HCC) 01/2019   Colon cancer (HCC)    Colostomy in place [facility])    Dyslipidemia    Dyspnea    on exertion   Glaucoma    History of kidney stones    Hyperlipidemia    Hypertension    Mass of upper lobe of left lung 01/2019   Neuromuscular disorder (HCC)    Pneumonia    Rectal cancer (HCC) 2015   Seasonal allergies     PAST SURGICAL HISTORY: Past Surgical History:  Procedure Laterality Date   CHOLECYSTECTOMY  COLON SURGERY     has a permanment ostomy   COLONOSCOPY N/A 12/14/2023   Procedure: COLONOSCOPY;  Surgeon: San Sandor GAILS, DO;  Location: [facility] END[facility]COPY;  Service: Gastroenterology;  Laterality: N/A;   ENDOBRONCHIAL ULTRASOUND N/A  07/01/2018   Procedure: ENDOBRONCHIAL ULTRASOUND;  Surgeon: Tamea Dedra CROME, MD;  Location: [facility] ORS;  Service: Cardiopulmonary;  Laterality: N/A;   ESOPHAGOGASTRODUODENOSCOPY N/A 12/14/2023   Procedure: EGD (ESOPHAGOGASTRODUODENOSCOPY);  Surgeon: San Sandor GAILS, DO;  Location: [facility] ENDOSCOPY;  Service: Gastroenterology;  Laterality: N/A;   FLEXIBLE BRONCHOSCOPY Left 11/20/2018   Procedure: FLEXIBLE BRONCHOSCOPY;  Surgeon: Tamea Dedra CROME, MD;  Location: [facility] ORS;  Service: Cardiopulmonary;  Laterality: Left;   FLEXIBLE BRONCHOSCOPY Left 12/23/2021   Procedure: FLEXIBLE BRONCHOSCOPY;  Surgeon: Tamea Dedra CROME, MD;  Location: [facility] ORS;  Service: Pulmonary;  Laterality: Left;   FLEXIBLE BRONCHOSCOPY Left 08/09/2022   Procedure: FLEXIBLE BRONCHOSCOPY;  Surgeon: Tamea Dedra CROME, MD;  Location: [facility] ORS;  Service: Pulmonary;  Laterality: Left;   FLEXIBLE BRONCHOSCOPY Left 04/30/2023   Procedure: BRONCHOSCOPY, FLEXIBLE;  Surgeon: Tamea Dedra CROME, MD;  Location: [facility] ORS;  Service: Pulmonary;  Laterality: Left;   INTERCOSTAL NERVE BLOCK Left 02/03/2019   Procedure: INTERCOSTAL NERVE BLOCK WITH EXPAREL ;  Surgeon: Army Dallas NOVAK, MD;  Location: [facility] OR;  Service: Thoracic;  Laterality: Left;   KNEE ARTHROSCOPY Right    KNEE SURGERY Left    torn ligaments (staple in place)   laceration of wrist Right    LOBECTOMY Left 02/03/2019   Procedure: LEFT UPPER LOBECTOMY WITH LYMPH NODE DISSECTION;  Surgeon: Army Dallas NOVAK, MD;  Location: [facility] OR;  Service: Thoracic;  Laterality: Left;   LUMBAR DISC SURGERY     ruptured disc   PORT A CATH REVISION Left 08/05/2018   Procedure: PORT A CATH REVISION, REPOSITION LEFT;  Surgeon: Dessa Reyes ORN, MD;  Location: [facility] ORS;  Service: General;  Laterality: Left;   PORT-A-CATH REMOVAL Right 12/02/2014   Procedure: REMOVAL PORT-A-CATH;  Surgeon: Lonni Brands, MD;  Location: [facility] ORS;  Service: General;  Laterality: Right;   PORTACATH  PLACEMENT     PORTACATH PLACEMENT Left 07/29/2018   Procedure: INSERTION PORT-A-CATH LEFT;  Surgeon: Dessa Reyes ORN, MD;  Location: [facility] ORS;  Service: General;  Laterality: Left;   RECTAL BIOPSY  04/30/2013   RIGID BRONCHOSCOPY Left 08/09/2022   Procedure: RIGID BRONCHOSCOPY;  Surgeon: Tamea Dedra CROME, MD;  Location: [facility] ORS;  Service: Pulmonary;  Laterality: Left;   SPINE SURGERY  1989   THORACOTOMY/LOBECTOMY Left 02/03/2019   Procedure: MINITHORACOTOMY;  Surgeon: Army Dallas NOVAK, MD;  Location: [facility] OR;  Service: Thoracic;  Laterality: Left;   VIDEO ASSISTED THORACOSCOPY (VATS)/WEDGE RESECTION Left 02/03/2019   Procedure: VIDEO ASSISTED THORACOSCOPY (VATS)/LUNG RESECTION;  Surgeon: Army Dallas NOVAK, MD;  Location: [facility] OR;  Service: Thoracic;  Laterality: Left;   VIDEO BRONCHOSCOPY N/A 02/03/2019   Procedure: VIDEO BRONCHOSCOPY;  Surgeon: Army Dallas NOVAK, MD;  Location: [facility] OR;  Service: Thoracic;  Laterality: N/A;   VIDEO BRONCHOSCOPY N/A 10/28/2021   Procedure: VIDEO BRONCHOSCOPY WITH CRYO and DEBULKING;  Surgeon: Tamea Dedra CROME, MD;  Location: [facility] ORS;  Service: Cardiopulmonary;  Laterality: N/A;   VIDEO BRONCHOSCOPY WITH ENDOBRONCHIAL ULTRASOUND N/A 12/30/2018   Procedure: VIDEO BRONCHOSCOPY WITH ENDOBRONCHIAL ULTRASOUND;  Surgeon: Army Dallas NOVAK, MD;  Location: [facility] OR;  Service: Thoracic;  Laterality: N/A;   XI ROBOT ABDOMINAL PERINEAL RESECTION  10/09/2013    FAMILY HISTORY: Father with prostate cancer.  COPD, CHF.  ADVANCED DIRECTIVES:    HEALTH MAINTENANCE: Social History   Tobacco Use   Smoking status: Former    Types: Cigars   Smokeless tobacco: Current    Types: Chew   Tobacco comments:    No Cigars. Does Dip. Khj 02/16/2023  Vaping Use   Vaping status: Never Used  Substance Use Topics   Alcohol use: Yes    Comment: Occassional beer   Drug use: Never     Colonoscopy:  PAP:  Bone density:  Lipid panel:  Allergies  Allergen Reactions    Sulfa Antibiotics Hives   Gabapentin  Diarrhea   Morphine  And Codeine Other (See Comments)    Altered mental status    Current Outpatient Medications  Medication Sig Dispense Refill   acetaminophen  (TYLENOL ) 500 MG tablet Take 1,000 mg by mouth every 6 (six) hours as needed for mild pain.     Albuterol -Budesonide  (AIRSUPRA ) 90-80 MCG/ACT AERO Inhale 2 puffs into the lungs every 6 (six) hours as needed. 10.7 g 2   amLODipine  (NORVASC ) 10 MG tablet Take 1 tablet (10 mg total) by mouth daily. 90 tablet 3   calcium  carbonate (TUMS - DOSED IN MG ELEMENTAL CALCIUM ) 500 MG chewable tablet Chew 2 tablets by mouth as needed for indigestion or heartburn.     cetirizine (ZYRTEC) 10 MG tablet Take 10 mg by mouth at bedtime.      cyclobenzaprine  (FLEXERIL ) 10 MG tablet TAKE 1 TABLET BY MOUTH THREE TIMES A DAY AS NEEDED FOR MUSCLE SPASM (Patient taking differently: Take 10 mg by mouth at bedtime.) 90 tablet 3   loperamide  (IMODIUM ) 2 MG capsule Take 4 mg by mouth as needed for diarrhea or loose stools.      Multiple Vitamin (MULTIVITAMIN WITH MINERALS) TABS tablet Take 1 tablet by mouth every evening.     tadalafil  (CIALIS ) 20 MG tablet TAKE 1 TAB BY MOUTH 1 HOUR PRIOR TO INTERCOURSE AS NEEDED 30 tablet 3   WIXELA INHUB 250-50 MCG/ACT AEPB INHALE 1 PUFF INTO THE LUNGS IN THE MORNING AND AT BEDTIME. 60 each 5   fluconazole  (DIFLUCAN ) 100 MG tablet Take 1 tablet (100 mg total) by mouth daily. 13 tablet 0   pantoprazole  (PROTONIX ) 40 MG tablet Take 1 tablet (40 mg total) by mouth daily before breakfast for 14 days. 14 tablet 0   No current facility-administered medications for this visit.   Facility-Administered Medications Ordered in Other Visits  Medication Dose Route Frequency Provider Last Rate Last Admin   heparin  lock flush 100 unit/mL  500 Units Intravenous Once Finnegan, Timothy J, MD       sodium chloride  flush (NS) 0.9 % injection 10 mL  10 mL Intravenous Once Finnegan, Timothy J, MD         OBJECTIVE: Vitals:   02/13/24 0914 02/13/24 0917  BP: (!) 148/84 (!) 144/81  Pulse: 90 87  Resp: 16   Temp: (!) 97.1 F (36.2 C)   SpO2: 100%      Body mass index is 34.05 kg/m.    ECOG FS:0 - Asymptomatic  General: Well-developed, well-nourished, no acute distress. Eyes: Pink conjunctiva, anicteric sclera. HEENT: Normocephalic, moist mucous membranes. Lungs: No audible wheezing or coughing. Heart: Regular rate and rhythm. Abdomen: Soft, nontender, no obvious distention. Musculoskeletal: No edema, cyanosis, or clubbing. Neuro: Alert, answering all questions appropriately. Cranial nerves grossly intact. Skin: No rashes or petechiae noted. Psych: Normal affect.  LAB RESULTS:  Lab Results  Component Value Date   NA 139 12/19/2023  K 4.2 12/19/2023   CL 109 12/19/2023   CO2 21 (L) 12/19/2023   GLUCOSE 115 (H) 12/19/2023   BUN 26 (H) 12/19/2023   CREATININE 0.84 12/19/2023   CALCIUM  8.3 (L) 12/19/2023   PROT 5.7 (L) 12/12/2023   ALBUMIN  3.4 (L) 12/12/2023   AST 18 12/12/2023   ALT 17 12/12/2023   ALKPHOS 52 12/12/2023   BILITOT 0.4 12/12/2023   GFRNONAA >60 12/19/2023   GFRAA >60 09/16/2019    Lab Results  Component Value Date   WBC 3.4 (L) 02/13/2024   NEUTROABS 2.0 02/13/2024   HGB 9.7 (L) 02/13/2024   HCT 32.5 (L) 02/13/2024   MCV 80.8 02/13/2024   PLT 476 (H) 02/13/2024   Lab Results  Component Value Date   IRON  20 (L) 12/19/2023   TIBC 363 12/19/2023   IRONPCTSAT 5 (L) 12/19/2023   Lab Results  Component Value Date   FERRITIN 30 12/19/2023     STUDIES: No results found.  Narrative & Impression  EXAM: CTA ABDOMEN AND PELVIS WITHOUT AND WITH CONTRAST 12/12/2023 07:19:49 AM   TECHNIQUE: CTA images of the abdomen and pelvis without and with intravenous contrast (iohexol  (OMNIPAQUE ) 350 MG/ML injection 75 mL IOHEXOL  350 MG/ML SOLN). Three-dimensional MIP/volume rendered formations were performed. Automated exposure control, iterative  reconstruction, and/or weight based adjustment of the mA/kV was utilized to reduce the radiation dose to as low as reasonably achievable.   COMPARISON: 10/25/2023   CLINICAL HISTORY: Lower GI bleed; red blood in colostomy.   FINDINGS:   VASCULATURE: No definite evidence of contrast extravasation is noted to suggest gastrointestinal bleeding.   AORTA: No acute finding. No abdominal aortic aneurysm. No dissection.   CELIAC TRUNK: No acute finding. No occlusion or significant stenosis.   SUPERIOR MESENTERIC ARTERY: No acute finding. No occlusion or significant stenosis.   RENAL ARTERIES: No acute finding. No occlusion or significant stenosis.   ILIAC ARTERIES: No acute finding. No occlusion or significant stenosis.   LIVER: The liver is unremarkable.   GALLBLADDER AND BILE DUCTS: Status post cholecystectomy. No biliary ductal dilatation.   SPLEEN: The spleen is unremarkable.   PANCREAS: The pancreas is unremarkable.   ADRENAL GLANDS: Bilateral adrenal glands demonstrate no acute abnormality.   KIDNEYS, URETERS AND BLADDER: Bilateral nonobstructive nephrolithiasis. No stones in the ureters. No hydronephrosis. No perinephric or periureteral stranding. Urinary bladder is unremarkable.   GI AND BOWEL: Colostomy noted in left lower quadrant. Diverticulosis of the descending colon without inflammation. 4.5 x 4.0 cm irregular mass seen in rectal region consistent with history of rectal malignancy. Stomach and duodenal sweep demonstrate no acute abnormality. There is no bowel obstruction. No abnormal bowel wall thickening or distension.   REPRODUCTIVE: Reproductive organs are unremarkable.   PERITONEUM AND RETRPERITONEUM: No ascites or free air.   LUNG BASE: No acute abnormality.   LYMPH NODES: No lymphadenopathy.   BONES AND SOFT TISSUES: No acute abnormality of the bones. No acute soft tissue abnormality.   IMPRESSION: 1. No definite evidence of active  gastrointestinal bleeding on this CTA. 2. Irregular 4.5 x 4.0 cm rectal mass, compatible with known rectal malignancy    ONCOLOGY HISTORY:  Patient was initially considered a clinical stage IIIa, but after completing neoadjuvant 5-FU and XRT followed by surgical excision on October 08, 2013 he was noted to be pathologic stage Ia.  He declined adjuvant FOLFOX at that time.  More recently, patient's CEA started trending up and CT of the chest revealed a large 7.7 cm left  upper lobe mass biopsy consistent with rectal adenocarcinoma.  Patient completed cycle 6 of FOLFOX plus Avastin  on October 16, 2018.  Repeat biopsy did not reveal any evidence of malignancy, but persistent fungal infection.  Patient underwent repeat bronchoscopy on December 30, 2018 which did in fact reveal residual malignancy.  He subsequently underwent surgical resection on February 03, 2019 with complete removal of residual disease.  One lymph node had a fragment of malignancy.  Patient did not require additional adjuvant chemotherapy or XRT.   ASSESSMENT: Recurrent, stage IV rectal adenocarcinoma.  PLAN:    Recurrent, stage IV rectal adenocarcinoma: See oncology history as above.  No evidence of disease.  Patient's most recent CT scan on October 25, 2023 reviewed independently with no obvious evidence of recurrent or progressive disease.  CEA continues to be within normal limits.  No intervention is needed at this time.  Will continue evaluation and imaging every 6 months until patient is 5 years removed from his surgery at which point we will transition to yearly visits.  Continue follow-up with pulmonology as indicated.  Return to clinic in 6 months with repeat imaging and further evaluation at which point patient can be transition to yearly.  No changes to surveillance poc CT scheduled for April  Iron  deficiency anemia due to chronic gastrointestinal blood loss Chronic anemia from prior gastrointestinal bleeding, improving with  IV iron . Hemoglobin at 9.7 g/dL. No ongoing bleeding. Improved energy, no dizziness, dyspnea, melena, or hematochezia. - Redrew iron  panel and ferritin to assess response pending results. - Planned to review ferritin and iron  studies this afternoon and call with further recommendations. - Instructed to monitor for gastrointestinal bleeding symptoms and report if he occurs.  History of gastrointestinal hemorrhage Significant prior hemorrhage causing iron  deficiency anemia. No current recurrent bleeding. No gastroenterology follow-up yet. - Planned to contact gastroenterology for follow-up and management of bleeding risk. - Instructed to report any recurrence of bleeding symptoms promptly.   Follow up plan:   No blood tomorrow Venofer  tomorrow    Patient expressed understanding and was in agreement with this plan. He also understands that He can call clinic at any time with any questions, concerns, or complaints.    Morna Husband, NP   02/13/2024 9:39 AM     "

## 2024-02-14 ENCOUNTER — Encounter: Payer: Self-pay | Admitting: Oncology

## 2024-02-14 ENCOUNTER — Inpatient Hospital Stay

## 2024-02-14 ENCOUNTER — Ambulatory Visit: Admitting: Pulmonary Disease

## 2024-02-20 ENCOUNTER — Inpatient Hospital Stay

## 2024-02-21 ENCOUNTER — Telehealth: Payer: Self-pay | Admitting: Oncology

## 2024-02-21 NOTE — Telephone Encounter (Signed)
 Due to weather, pt called to r/s iron  appt from 2/2. New date and time confirmed with pt

## 2024-02-25 ENCOUNTER — Inpatient Hospital Stay: Attending: Oncology

## 2024-02-27 ENCOUNTER — Inpatient Hospital Stay

## 2024-02-29 ENCOUNTER — Inpatient Hospital Stay: Payer: Self-pay | Attending: Oncology

## 2024-02-29 VITALS — BP 149/79 | HR 81 | Temp 97.7°F | Resp 18

## 2024-02-29 DIAGNOSIS — D509 Iron deficiency anemia, unspecified: Secondary | ICD-10-CM

## 2024-02-29 MED ORDER — SODIUM CHLORIDE 0.9% FLUSH
10.0000 mL | Freq: Once | INTRAVENOUS | Status: AC | PRN
  Administered 2024-02-29: 10 mL
  Filled 2024-02-29: qty 10

## 2024-02-29 MED ORDER — IRON SUCROSE 20 MG/ML IV SOLN
200.0000 mg | Freq: Once | INTRAVENOUS | Status: AC
  Administered 2024-02-29: 200 mg via INTRAVENOUS
  Filled 2024-02-29: qty 10

## 2024-03-03 ENCOUNTER — Inpatient Hospital Stay

## 2024-03-05 ENCOUNTER — Inpatient Hospital Stay

## 2024-03-12 ENCOUNTER — Inpatient Hospital Stay

## 2024-03-12 ENCOUNTER — Inpatient Hospital Stay: Admitting: Nurse Practitioner

## 2024-03-24 ENCOUNTER — Ambulatory Visit: Admitting: Pulmonary Disease

## 2024-05-07 ENCOUNTER — Ambulatory Visit

## 2024-05-07 ENCOUNTER — Inpatient Hospital Stay

## 2024-05-14 ENCOUNTER — Inpatient Hospital Stay: Admitting: Oncology

## 2024-08-28 ENCOUNTER — Ambulatory Visit

## 2024-10-03 ENCOUNTER — Encounter: Admitting: Family Medicine
